# Patient Record
Sex: Female | Born: 1950 | Race: White | Hispanic: No | Marital: Married | Smoking: Never smoker
Health system: Southern US, Community
[De-identification: ages and names within clinical notes are randomized; demographics above are authoritative.]

## PROBLEM LIST (undated history)

## (undated) DIAGNOSIS — R112 Nausea with vomiting, unspecified: Secondary | ICD-10-CM

## (undated) DIAGNOSIS — Z9889 Other specified postprocedural states: Secondary | ICD-10-CM

## (undated) DIAGNOSIS — F329 Major depressive disorder, single episode, unspecified: Secondary | ICD-10-CM

## (undated) DIAGNOSIS — R569 Unspecified convulsions: Secondary | ICD-10-CM

## (undated) DIAGNOSIS — F1020 Alcohol dependence, uncomplicated: Secondary | ICD-10-CM

## (undated) DIAGNOSIS — F32A Depression, unspecified: Secondary | ICD-10-CM

## (undated) DIAGNOSIS — I10 Essential (primary) hypertension: Secondary | ICD-10-CM

## (undated) DIAGNOSIS — T8859XA Other complications of anesthesia, initial encounter: Secondary | ICD-10-CM

## (undated) DIAGNOSIS — G809 Cerebral palsy, unspecified: Secondary | ICD-10-CM

## (undated) DIAGNOSIS — T4145XA Adverse effect of unspecified anesthetic, initial encounter: Secondary | ICD-10-CM

## (undated) HISTORY — PX: BREAST ENHANCEMENT SURGERY: SHX7

## (undated) HISTORY — PX: BLADDER SURGERY: SHX569

## (undated) HISTORY — PX: TUBAL LIGATION: SHX77

## (undated) HISTORY — PX: BREAST SURGERY: SHX581

## (undated) HISTORY — PX: BACK SURGERY: SHX140

---

## 1998-01-09 ENCOUNTER — Inpatient Hospital Stay (HOSPITAL_COMMUNITY): Admission: EM | Admit: 1998-01-09 | Discharge: 1998-01-14 | Payer: Self-pay | Admitting: Emergency Medicine

## 1998-01-15 ENCOUNTER — Encounter (HOSPITAL_COMMUNITY): Admission: RE | Admit: 1998-01-15 | Discharge: 1998-04-15 | Payer: Self-pay | Admitting: Psychiatry

## 2001-05-28 ENCOUNTER — Other Ambulatory Visit: Admission: RE | Admit: 2001-05-28 | Discharge: 2001-05-28 | Payer: Self-pay | Admitting: Obstetrics and Gynecology

## 2001-06-01 ENCOUNTER — Encounter: Payer: Self-pay | Admitting: Obstetrics and Gynecology

## 2001-06-01 ENCOUNTER — Encounter: Admission: RE | Admit: 2001-06-01 | Discharge: 2001-06-01 | Payer: Self-pay | Admitting: Obstetrics and Gynecology

## 2001-06-12 ENCOUNTER — Encounter: Payer: Self-pay | Admitting: Obstetrics and Gynecology

## 2001-06-12 ENCOUNTER — Encounter: Admission: RE | Admit: 2001-06-12 | Discharge: 2001-06-12 | Payer: Self-pay | Admitting: Obstetrics and Gynecology

## 2001-07-25 ENCOUNTER — Encounter: Payer: Self-pay | Admitting: Obstetrics and Gynecology

## 2001-07-25 ENCOUNTER — Encounter: Admission: RE | Admit: 2001-07-25 | Discharge: 2001-07-25 | Payer: Self-pay | Admitting: Obstetrics and Gynecology

## 2002-03-01 ENCOUNTER — Observation Stay (HOSPITAL_COMMUNITY): Admission: RE | Admit: 2002-03-01 | Discharge: 2002-03-02 | Payer: Self-pay | Admitting: Orthopaedic Surgery

## 2002-06-20 ENCOUNTER — Ambulatory Visit (HOSPITAL_COMMUNITY): Admission: RE | Admit: 2002-06-20 | Discharge: 2002-06-21 | Payer: Self-pay | Admitting: Neurological Surgery

## 2002-06-20 ENCOUNTER — Encounter: Payer: Self-pay | Admitting: Neurological Surgery

## 2002-07-09 ENCOUNTER — Encounter: Payer: Self-pay | Admitting: Neurological Surgery

## 2002-07-09 ENCOUNTER — Ambulatory Visit (HOSPITAL_COMMUNITY): Admission: RE | Admit: 2002-07-09 | Discharge: 2002-07-09 | Payer: Self-pay | Admitting: Neurological Surgery

## 2003-03-31 ENCOUNTER — Other Ambulatory Visit: Admission: RE | Admit: 2003-03-31 | Discharge: 2003-03-31 | Payer: Self-pay | Admitting: Internal Medicine

## 2003-04-25 ENCOUNTER — Inpatient Hospital Stay (HOSPITAL_COMMUNITY): Admission: RE | Admit: 2003-04-25 | Discharge: 2003-04-26 | Payer: Self-pay | Admitting: Orthopaedic Surgery

## 2007-03-28 ENCOUNTER — Other Ambulatory Visit: Admission: RE | Admit: 2007-03-28 | Discharge: 2007-03-28 | Payer: Self-pay | Admitting: Family Medicine

## 2008-09-07 ENCOUNTER — Emergency Department (HOSPITAL_COMMUNITY): Admission: EM | Admit: 2008-09-07 | Discharge: 2008-09-07 | Payer: Self-pay | Admitting: Emergency Medicine

## 2008-09-08 ENCOUNTER — Ambulatory Visit: Payer: Self-pay | Admitting: *Deleted

## 2008-09-08 ENCOUNTER — Emergency Department (HOSPITAL_COMMUNITY): Admission: EM | Admit: 2008-09-08 | Discharge: 2008-09-08 | Payer: Self-pay | Admitting: Emergency Medicine

## 2008-09-08 ENCOUNTER — Inpatient Hospital Stay (HOSPITAL_COMMUNITY): Admission: RE | Admit: 2008-09-08 | Discharge: 2008-09-11 | Payer: Self-pay | Admitting: *Deleted

## 2010-01-31 ENCOUNTER — Inpatient Hospital Stay (HOSPITAL_COMMUNITY): Admission: EM | Admit: 2010-01-31 | Discharge: 2010-02-02 | Payer: Self-pay | Admitting: Emergency Medicine

## 2010-07-25 ENCOUNTER — Encounter: Payer: Self-pay | Admitting: Family Medicine

## 2010-09-17 LAB — BASIC METABOLIC PANEL
BUN: 15 mg/dL (ref 6–23)
BUN: 20 mg/dL (ref 6–23)
CO2: 26 mEq/L (ref 19–32)
CO2: 26 mEq/L (ref 19–32)
Calcium: 9.3 mg/dL (ref 8.4–10.5)
Calcium: 9.4 mg/dL (ref 8.4–10.5)
Chloride: 93 mEq/L — ABNORMAL LOW (ref 96–112)
Creatinine, Ser: 0.72 mg/dL (ref 0.4–1.2)
Creatinine, Ser: 1.05 mg/dL (ref 0.4–1.2)
GFR calc Af Amer: >60 mL/min (ref >60)
GFR calc non Af Amer: 53 mL/min — ABNORMAL LOW (ref >60)
Glucose, Bld: 103 mg/dL — ABNORMAL HIGH (ref 70–99)
Glucose, Bld: 97 mg/dL (ref 70–99)
Potassium: 3.2 mEq/L — ABNORMAL LOW (ref 3.5–5.1)
Sodium: 131 mEq/L — ABNORMAL LOW (ref 135–145)
Sodium: 134 mEq/L — ABNORMAL LOW (ref 135–145)

## 2010-09-17 LAB — CBC
HCT: 39.9 % (ref 36.0–46.0)
MCH: 32.6 pg (ref 26.0–34.0)
MCHC: 34.7 g/dL (ref 30.0–36.0)
MCV: 93.9 fL (ref 78.0–100.0)
RDW: 13.9 % (ref 11.5–15.5)

## 2010-09-18 LAB — CBC
HCT: 40.1 % (ref 36.0–46.0)
Hemoglobin: 14 g/dL (ref 12.0–15.0)
MCH: 32.6 pg (ref 26.0–34.0)
MCHC: 35 g/dL (ref 30.0–36.0)

## 2010-09-18 LAB — COMPREHENSIVE METABOLIC PANEL
CO2: 17 mEq/L — ABNORMAL LOW (ref 19–32)
Calcium: 8.9 mg/dL (ref 8.4–10.5)
Creatinine, Ser: 1.11 mg/dL (ref 0.4–1.2)
GFR calc Af Amer: >60 mL/min (ref >60)
GFR calc non Af Amer: 50 mL/min — ABNORMAL LOW (ref >60)
Glucose, Bld: 76 mg/dL (ref 70–99)

## 2010-09-18 LAB — RAPID URINE DRUG SCREEN, HOSP PERFORMED
Cocaine: NOT DETECTED
Opiates: NOT DETECTED
Tetrahydrocannabinol: NOT DETECTED

## 2010-09-18 LAB — DIFFERENTIAL
Lymphocytes Relative: 7 % — ABNORMAL LOW (ref 12–46)
Lymphs Abs: 0.5 10*3/uL — ABNORMAL LOW (ref 0.7–4.0)
Neutrophils Relative %: 79 % — ABNORMAL HIGH (ref 43–77)

## 2010-09-18 LAB — URINALYSIS, ROUTINE W REFLEX MICROSCOPIC
Ketones, ur: >80 mg/dL — AB
Specific Gravity, Urine: 1.02 (ref 1.005–1.030)
Urobilinogen, UA: 1 mg/dL (ref 0.0–1.0)

## 2010-09-18 LAB — PHOSPHORUS: Phosphorus: 2.5 mg/dL (ref 2.3–4.6)

## 2010-09-18 LAB — BASIC METABOLIC PANEL
BUN: 18 mg/dL (ref 6–23)
Chloride: 89 mEq/L — ABNORMAL LOW (ref 96–112)
Creatinine, Ser: 1.05 mg/dL (ref 0.4–1.2)
GFR calc non Af Amer: 53 mL/min — ABNORMAL LOW (ref >60)

## 2010-09-18 LAB — AMMONIA: Ammonia: 47 umol/L — ABNORMAL HIGH (ref 11–35)

## 2010-09-18 LAB — BRAIN NATRIURETIC PEPTIDE: Pro B Natriuretic peptide (BNP): 56 pg/mL (ref 0.0–100.0)

## 2010-09-18 LAB — URINE MICROSCOPIC-ADD ON

## 2010-09-18 LAB — MAGNESIUM: Magnesium: 2.6 mg/dL — ABNORMAL HIGH (ref 1.5–2.5)

## 2010-10-14 LAB — RAPID URINE DRUG SCREEN, HOSP PERFORMED
Amphetamines: NOT DETECTED
Benzodiazepines: NOT DETECTED
Cocaine: NOT DETECTED
Opiates: NOT DETECTED
Tetrahydrocannabinol: NOT DETECTED

## 2010-10-14 LAB — DIFFERENTIAL
Basophils Relative: 0 % (ref 0–1)
Basophils Relative: 0 % (ref 0–1)
Eosinophils Absolute: 0 10*3/uL (ref 0.0–0.7)
Lymphocytes Relative: 18 % (ref 12–46)
Lymphs Abs: 1 10*3/uL (ref 0.7–4.0)
Lymphs Abs: 1.9 10*3/uL (ref 0.7–4.0)
Monocytes Absolute: 0.3 10*3/uL (ref 0.1–1.0)
Monocytes Absolute: 0.5 10*3/uL (ref 0.1–1.0)
Monocytes Relative: 3 % (ref 3–12)
Monocytes Relative: 5 % (ref 3–12)
Neutro Abs: 8.5 10*3/uL — ABNORMAL HIGH (ref 1.7–7.7)
Neutro Abs: 8.6 10*3/uL — ABNORMAL HIGH (ref 1.7–7.7)

## 2010-10-14 LAB — COMPREHENSIVE METABOLIC PANEL
ALT: 30 U/L (ref 0–35)
Albumin: 4 g/dL (ref 3.5–5.2)
Albumin: 4.5 g/dL (ref 3.5–5.2)
Alkaline Phosphatase: 137 U/L — ABNORMAL HIGH (ref 39–117)
Alkaline Phosphatase: 137 U/L — ABNORMAL HIGH (ref 39–117)
BUN: 13 mg/dL (ref 6–23)
Calcium: 8.5 mg/dL (ref 8.4–10.5)
Calcium: 8.6 mg/dL (ref 8.4–10.5)
GFR calc Af Amer: >60 mL/min (ref >60)
Potassium: 3.8 mEq/L (ref 3.5–5.1)
Potassium: 4 mEq/L (ref 3.5–5.1)
Sodium: 138 mEq/L (ref 135–145)
Total Protein: 7.3 g/dL (ref 6.0–8.3)
Total Protein: 7.8 g/dL (ref 6.0–8.3)

## 2010-10-14 LAB — CBC
HCT: 45.5 % (ref 36.0–46.0)
MCHC: 34 g/dL (ref 30.0–36.0)
MCHC: 34.3 g/dL (ref 30.0–36.0)
Platelets: 227 10*3/uL (ref 150–400)
Platelets: 271 10*3/uL (ref 150–400)
RDW: 13.2 % (ref 11.5–15.5)
RDW: 13.4 % (ref 11.5–15.5)

## 2010-10-14 LAB — ETHANOL
Alcohol, Ethyl (B): 227 mg/dL — ABNORMAL HIGH (ref 0–10)
Alcohol, Ethyl (B): 292 mg/dL — ABNORMAL HIGH (ref 0–10)

## 2010-11-16 NOTE — H&P (Signed)
NAMEJAVONA, Ashley Bradley NO.:  [id number]   MEDICAL RECORD NO.:  [id number]          PATIENT TYPE:  IPS   LOCATION:  0302                          FACILITY[facility]  [facility]   PHYSICIAN:  Jasmine Pang, M.D. DATE OF BIRTH:  11/22/1950   DATE OF ADMISSION:  09/08/2008  DATE OF DISCHARGE:                       PSYCHIATRIC ADMISSION ASSESSMENT   IDENTIFICATION:  A 60 year old female.  This is a voluntary admission.   HISTORY OF PRESENT ILLNESS:  First [facility] admission for this 60 year old  who presents requesting alcohol detox.  Says that she had 9 years of  abstinence from alcohol until she relapsed about one week ago.  Her  drinking rapidly escalated to the point that for the past couple days  she has been drinking about a pint of vodka daily.  She says that she  believes that she relapsed due to increased recent stressors related to  parenting issues.  She has a total of nine children between herself and  her husband of 7 years.  Her youngest daughter left home in July 2009  which was quite a loss for her.  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  She denies any other substance  abuse.  Would like to re-achieve abstinence and plans on following up  with Alcoholics Anonymous.  She denies any suicidal thoughts.   PAST PSYCHIATRIC HISTORY:  She has had counseling in the past.  None  recently.  She also has a history of chronic depression and feels that  that has gotten a bit worse lately.  Has been on Prozac 20 mg now, at  least for the past 2 years.  Has a history of previous trial of Effexor  which made her anxious, jittery and sick.  She has a history of one  DWI 17 years ago.  She denies any history of suicidal thoughts or  suicide attempts.   SOCIAL HISTORY:  Registered nurse case Production designer, theatre/television/film.  Currently working for  [employer].  Married for the  past 7 years, husband is  17 years older than she is, and she reports generally that the marriage  is good, although they do have some age differences.  Recent parenting  problems including a last child leaving home in July 2009.  Recently her  stepson has also been having some problems and she has been attempting  to help him.   FAMILY HISTORY:  Mother with chronic depression.   ALCOHOL AND DRUG HISTORY:  History of alcohol abuse, starting to drink  alcohol around age 39, 9 years is longest period abstinent.  She has had  a previous stay at fellowship [facility] in [place] and had one prior detox  at [facility] in 1999.   PRIMARY CARE PHYSICIAN:  Theatre stage manager.   MEDICAL HISTORY:  No current problems.   PAST MEDICAL HISTORY:  Significant for history of seizures with  withdrawal in the past.   CURRENT MEDICATIONS:  Prozac 20 mg daily.   DRUG ALLERGIES:  IVP DYE, IODINE, CONTRAST MEDIUM AND MACRODANTIN.   PHYSICAL EXAMINATION:  GENERAL:  Physical exam was done in the emergency  room as noted in the record.  This is a healthy, but flushed and  disheveled appearing Caucasian female in no distress.  VITAL SIGNS:  Temperature 98.8, pulse 127, respirations 20, blood  pressure 134/82 on presentation.   DIAGNOSTIC STUDIES:  Revealed an alcohol level of 227.  CBC:  WBC 10.2,  hemoglobin 15.1, hematocrit 44.5, platelets 227,000, MCV 91.2.  Chemistry:  Sodium 138, potassium 4.0, chloride 99, carbon dioxide 23,  BUN 11, creatinine 0.71.  Liver enzymes SGOT 37, SGPT 30, alkaline  phosphatase 137, total bilirubin 0.7.   MENTAL STATUS EXAM:  Fully alert female in full contact with reality.  Does have moist skin, flushed complexion, disheveled, appears to be in  full detox.  Somewhat unsteady on her feet, but no frank tremor.  No  motor ataxia.  Speech is normal.  Gives a coherent history.  No memory  lapses.  No thought blocking.  Mood is neutral.  Thought process logical  and  coherent.  No suicidal thoughts.  No dangerous ideas.  Immediate  recent and remote memory are intact.  Insight is good.  Impulse control  and judgment within normal limits.   DIAGNOSES:  AXIS I:  Alcohol abuse and dependence.  Depressive disorder  NOS.  AXIS II:  No diagnosis.  AXIS III:  No diagnosis.  AXIS IV:  Moderate to severe parenting issues.  AXIS V:  Current 44, past year not known.  Estimated at 69+.   PLAN:  Plan is to voluntarily admit her for a safe detox in 5 days and  we started her on a Librium protocol.  She will receive Librium in  tapering fashion along with supportive medications and vitamin  supplements.  She is ordered Ambien 10 mg h.s. p.r.n. insomnia and we  are going to increase her Prozac to 30 mg daily.  She feels that she has  been on it for so long that it is no longer giving her the support for  her depression that she needs.      Margaret A. Lorin Picket, N.P.      Jasmine Pang, M.D.  Electronically Signed    MAS/MEDQ  D:  09/09/2008  T:  09/10/2008  Job:  [id number]

## 2010-11-16 NOTE — Discharge Summary (Signed)
Ashley Bradley, Ashley Bradley NO.:  [id number]   MEDICAL RECORD NO.:  [id number]          PATIENT TYPE:  IPS   LOCATION:  0302                          FACILITY:  [facility]   PHYSICIAN:  Jasmine Pang, M.D. DATE OF BIRTH:  10-Mar-1951   DATE OF ADMISSION:  09/08/2008  DATE OF DISCHARGE:  09/11/2008                               DISCHARGE SUMMARY   IDENTIFICATION:  This is a 60 year old married white female who was  admitted on a voluntary basis on September 08, 2008.   HISTORY OF PRESENT ILLNESS:  This is the first [facility] admission for this 14-  year-old who presents requesting alcohol detox.  She states she had 9  years of abstinence from alcohol until she relapsed about 1 week ago.  Her drinking rapidly escalated to the point that for the past 10 couple  of days she has been drinking about a pint of Vodka daily.  She states  she believes that she relapsed due to the increased recent stressors  related to parenting issues.  She has a total of 9 children between  herself and her husband of 7 years.  Her youngest daughter left home in  July 2009, which was a loss for her.  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  She denies any other  substance abuse.  She would like to achieve abstinence and plans on  following up with alcoholic anonymous.  She denies any suicidal  thoughts.   PAST PSYCHIATRIC HISTORY:  The patient has had counseling in the past.  None recently.  She also had a history of chronic depression, which she  feels has gotten a bit worse lately.  She has been on Prozac 20 mg now  at least for 2 years.  She had a history of previous trial of Effexor,  which made her anxious, jittery, and sick.  She has a history of 1 DWI  17 years ago.  She denies any history of suicidal thoughts or suicide  attempt.   FAMILY HISTORY:  Mother has chronic depression.   ALCOHOL AND  DRUG HISTORY:  History of alcohol abuse started to drink  alcohol around the age of 33, 9 years is the longest period abstinent.  She has had a previous stay at [facility] in [place] and 1 prior  detox at [facility] in 1999.   MEDICAL HISTORY:  There is no current medical problems.   PAST MEDICAL HISTORY:  Significant history of seizures with withdrawal  in the past.   CURRENT MEDICATIONS:  Prozac 20 mg daily.   DRUG ALLERGIES:  IVP DYE, IODINE, CONTRAST MEDIUM, and MACRODANTIN.   PHYSICAL FINDINGS:  There were no acute physical or medical problems  noted.   DIAGNOSTIC STUDIES:  Revealed an alcohol level of 227.  CBC revealed WBC  of 10.2, hemoglobin of 15.1, hematocrit of 44.5, platelets 227,000, MCV  is 91.2.  Chemistries revealed sodium of 138, potassium of 4, chloride  99, carbon dioxide  23.  BUN 11, creatinine 0.71.  Liver enzymes revealed  SGOT of 37, SGPT of 30, alkaline phosphatase of 137, total bilirubin was  0.7.   HOSPITAL COURSE:  Upon admission, the patient was started on Librium  detox protocol.  She was also started on Ambien 10 mg p.o. q.h.s. p.r.n.  insomnia, Prozac was increased to 30 mg daily.  In the past, she has had  an increase up to 40 mg daily and felt too agitated on this, so she was  agreeable to going up to 30 mg and hopes that we could get some benefit  without side effects.  Possibly, after this she may be able to a higher  dose.  Due to palpitations, the patient was given atenolol 25 mg 1 time  now.  She was also started on Vistaril 25 mg q.4 h. p.r.n. anxiety and  Vistaril 25 mg q.h.s.  In individual sessions, the patient was reserved,  but cooperative.  She was very tearful and depressed.  She had a sad  affect.  She stated she had been trying to detox herself, but could not.  She has been clean for 9 years and thought I could drink a little bit.  She realizes now that she cannot drink at all.  Her step son has been  causing  family stress.  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  On September 10, 2008, the patient's mood was still depressed and anxious.  She was tearful.  She stated she did not like being in the hospital.  She was not able to sleep and her back pain was worsening.  She wanted  to go home as soon as possible.  She was not suicidal and it was  determined that we would try to get her out tomorrow.  On September 10, 2008, there was a family session with the patient's husband and daughter  to discuss ongoing concerns and discharge plans.  Husband states all  alcohol has been removed from the home and husband will be monitoring  the patient's medication upon discharge.  Daughter states that she is  within 10 minutes arrival of mother for support and plan on intending  support groups with the patient to gain keep her understanding and  support.  Husband stated he would be accompanying wife as well.  The  patient was tearful expressing concern about possibly losing her job  over relapse due to the professional responsibility of her employment.  She was questioning whether Yvonne Kendall was still practicing and wanted  to see her for followup.  She also asked for Dr. Dub Mikes for followup, but  he is not taking any new patient.  Family was receptive in grateful.  The patient was showing clear insight to relapse and depressive  triggers.  On September 11, 2008, mental status had improved from admission  status.  The patient was less depressed, less anxious.  Affect was  consistent with mood, though she still somewhat tearful.  There was no  suicidal or homicidal ideation.  No thoughts of self-injurious behavior.  No auditory or visual hallucinations.  No paranoia or delusions.  Thoughts were logical and goal-directed.  Thought content, no  predominant theme.  Cognitive was grossly intact.  Insight good.  Judgment good.  Impulse control good.   It was felt the patient was safe  for discharge today and she was going to be returning  home to live with  her husband.   DISCHARGE DIAGNOSES:  Axis I: Alcohol dependence formerly in remission  for 9 years, depressive disorder, not otherwise specified.  Axis II: None.  Axis III: None.  Axis IV: Moderate to severe (parenting issues, work stress, burden of  psychiatric problems).  Axis V: Global assessment of functioning was 50 upon discharge.  GAF was  44 highest past year and it was estimated GAF highest past year was 75-  80.   DISCHARGE PLAN:  There was no specific activity level or dietary  restriction.   POSTHOSPITAL CARE PLANS:  The patient will see Saul Fordyce at  [facility] for counselor and psychiatrist on September 15, 2008 at 9:20 a.m..   DISCHARGE MEDICATIONS:  1. Prozac 30 mg at bedtime.  2. Librium 25 mg 3 times a day on September 11, 2008, September 12, 2008, and      September 13, 2008; and 2 times a day on September 14, 2008, September 15, 2008, and September 16, 2008; and once daily on September 17, 2008, September 18, 2008, and September 19, 2008; and then discontinue.      Jasmine Pang, M.D.  Electronically Signed     BHS/MEDQ  D:  09/11/2008  T:  09/12/2008  Job:  [id number]

## 2010-11-19 NOTE — Op Note (Signed)
NAME:  Ashley Bradley, Ashley Bradley                         ACCOUNT NO.:  [id number]   MEDICAL RECORD NO.:  [id number]                   PATIENT TYPE:  OIB   LOCATION:  3021                                 FACILITY:  [facility]   PHYSICIAN:  Stefani Dama, M.D.               DATE OF BIRTH:  09-20-1950   DATE OF PROCEDURE:  06/20/2002  DATE OF DISCHARGE:  06/21/2002                                 OPERATIVE REPORT   PREOPERATIVE DIAGNOSIS:  Cervical spondylosis plus herniated nucleus  pulposus with left cervical radiculopathy, C5-6 and C6-7.   POSTOPERATIVE DIAGNOSIS:  Cervical spondylosis plus herniated nucleus  pulposus with left cervical radiculopathy, C5-6 and C6-7.   PROCEDURE:  Anterior cervical diskectomy and decompression of C5 and C6,  structural allograft, Synthes plate fixation.   SURGEON:  Stefani Dama, M.D.   ASSISTANT:  Clydene Fake, M.D.   ANESTHESIA:  General endotracheal.   INDICATIONS:  The patient is a 60 year old individual who has had a  significant left upper extremity radiculopathy for a period of several  months.  She has failed efforts at conservative management, and she has  spondylitic changes at C5-6 and at C6-7 with a herniated nucleus pulposus in  the foramen at C6-7 on the left.  She was advised regarding surgical  decompression of this process.  Because her symptoms have been unrelenting  and, indeed, she feels that they are worsening, I have advised that a  surgical intervention via an anterior diskectomy be performed.  She is now  admitted for this procedure.   DESCRIPTION OF PROCEDURE:  The patient was brought to the operating room and  placed on the table in supine position.  After smooth induction of general  endotracheal anesthesia, she was placed in five pounds of Holter traction.  The neck was prepped with Duraprep and draped in a sterile fashion.  A  transverse incision was made in the left side of the neck, and this was  carried down through  the platysma.  The plane between the  sternocleidomastoid and the strap muscles was dissected bluntly until the  prevertebral space was reached.  The first identifiable disk space was noted  to be that of C5-6 by localizing radiograph.  Then by dissecting the  esophagus medially, the longus colli muscle belly was entered and by  dissecting this away from the ventral anterior vertebral space, a self-  retaining retractor was placed in C5-6.  The disk space was opened ventrally  with a 15 blade and then using a combination of curettes and rongeurs, some  bony osteophytic material from the inferior margin of the body of C5 was  removed.  This opened up the disk space, and the disk was removed in a  piecemeal fashion using a combination of Kerrison rongeurs.  As the disk  space was evacuated toward the posterior longitudinal ligament, care was  taken to remove bits and  pieces of tissue which were noted to be attached to  the posterior longitudinal ligament that seemed to protrude beyond the end  of the vertebral bodies.  The ligament was then opened, and it was noted to  be significantly thickened and attached to an osteophyte from the inferior  margin of the body of C6.  Dissection was then carried out laterally to the  right side and to the left side. Uncinate process spurs from each of these  levels were then taken down with the high-speed air drill and 2.3 mm  dissecting tool.  In the end, the lateral recesses on each side were well-  decompressed.  The central canal was similarly well-decompressed.  A 7 mm  tricortical bone graft was shaved into its final position to fit in the  intervertebral space, and then it was tamped in until flush.  Attention was  then turned to C6-7, where a similar procedure was carried out at that disk  space.  The posterior longitudinal ligament on the left side was noted to  have a significant tear with some subligamentous material herniated behind  the  vertebral body.  This was gently teased out, and then the 2 and 3 mm  Kerrison punch were placed under the end of the ligament to remove it and  decompress the dural space in the interim.  The nerve roots off to either  side were well-decompressed.  Once this was accomplished, the second 7 mm  tricortical graft was shaved into its final configuration and placed in the  invertebral space and counter sunk slightly.  Traction was then removed.  The ventral aspects of the vertebral bodies were then shaved appropriately  and a 37 mm standard-size Synthes plate was contoured to the prevertebral  spaces and fastened with six locking 4 x 14 mm screws.  The area was  copiously irrigated with antibiotic irrigating solution.  Hemostasis of the  soft tissues was carefully obtained, and a localizing radiograph identified  good position of the surgical construct.  Then the platysma was closed with  3-0 Vicryl in interrupted fashion, and 3-0 Vicryl was used to close the  subcuticular skin.  The patient tolerated the procedure well.                                               Stefani Dama, M.D.    Merla Riches  D:  06/20/2002  T:  06/21/2002  Job:  [id number]

## 2010-11-19 NOTE — Op Note (Signed)
NAME:  Ashley Bradley, Ashley Bradley                         ACCOUNT NO.:  [id number]   MEDICAL RECORD NO.:  [id number]                   PATIENT TYPE:  INP   LOCATION:  5005                                 FACILITY:  [facility]   PHYSICIAN:  Mark C. Ophelia Charter, M.D.                 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   PROCEDURE:  L3-L4 decompression, bilateral foraminotomies, and cyst removal.   SURGEON:  Mark C. Ophelia Charter, M.D.   ASSISTANT:  Garnette Scheuermann, P.A.-C.   ANESTHESIA:  General.   ESTIMATED BLOOD LOSS:  300 cc.   PROCEDURE:  After induction of general anesthesia, the patient was placed on  the Andrews frame in the kneeling position with careful padding and  positioning, and preoperative Ancef prophylaxis.  The area was scrubbed with  Betadine and Vi-Drape applied.  Laminectomy sheath and needle localization  with the spinal needle, cross table lateral x-ray confirmed the needle was  exactly at the 3-4 level.  The patient has grade 1 anterolisthesis at this  level and has intact pars.  She had a cyst which was present centrally  intermixed with the ligamentum flavum causing central compression and she  was having primarily right leg pain and weakness not responsive to  conservative treatment.  The inferior aspect of the spinous process was  removed at 3 and the top portion of 4 removing about 1/4 of the spinous  process of L4.  Laminotomy was performed on both the right and left side.  The lamina was hypertrophic and there was good ligamentum flavum which was  adherent to the dura and required operative microscope for microdissection  with the nerve hook, dural separator, and dural forceps to gently dissect  the adherent ligament off of  the dura.  There was apparent crystals  consistent with old history of epidural steroid injections.  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  The cyst did not appear to extend  to the facet joint.  A 1/2 inch curved osteotome was used to remove some  bone out the palpable pedicles using the dural separator.  The bone was  removed from the right and left side until bone was removed all the way out  to the level of the pedicle and the dura was seen falling freely down to the  floor with no areas of compression.  The foramina was enlarged and thick  chunks of ligament were removed above and below relieving the compression.  The dural separator was used for palpation anteriorly to the dura with no  areas of compression.  The disc was inspected on both the right and left  side,  there was no disc herniation.  There was some hypertrophic ridging due  to the mild instability that the patient had.  After irrigation and final  palpation above and below some final removal of bone on the right and left  gutter to make sure she had a smooth wall, the roots were inspected, dura  was intact.  Repeat irrigation and then closure with 0  Vicryl in the fascia, 2-0 and 3-0 Vicryl in the subcutaneous tissue.  Placement of skin staples, postop dressing with Adaptic, 4 by 4s, and tape.  The patient tolerated the procedure well and was transported to the recovery  room in stable condition.                                               Mark C. Ophelia Charter, M.D.    MCY/MEDQ  D:  03/01/2002  T:  03/04/2002  Job:  [id number]

## 2010-11-19 NOTE — Op Note (Signed)
NAME:  Ashley Bradley, STAMP                         ACCOUNT NO.:  [id number]   MEDICAL RECORD NO.:  [id number]                   PATIENT TYPE:  INP   LOCATION:  2892                                 FACILITY:  [facility]   PHYSICIAN:  Mark C. Ophelia Charter, M.D.                 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   PROCEDURE:  Posterior C5 to C7 cervical fusion with interspinous wiring and  right posterior iliac crest bone graft and anterior cervical scar revision.   SURGEON:  Mark C. Ophelia Charter, M.D.   ESTIMATED BLOOD LOSS:  300 mL.   DRAINS:  One Hemovac neck and one Hemovac iliac crest.   BRIEF HISTORY:  This 60 year old female had an anterior cervical diskectomy  and fusion with Allograft and Synthes plating last year.  She has had  persistent neck and shoulder pain and CT scan showed pseudoarthrosis at the  C6-7 level.  There was motion on flexion and extension views and it appeared  on the scan that the C5-6 level was solid.  The patient also had some  problems with scar anteriorly and requested that it be revised.   PROCEDURE:  The patient was placed prone on a horseshoe head holder after  intubation with the arms placed at the side and prepping of the right  posterior iliac crest and neck.  10-10 drapes had been placed and half  sheets Betadine Vi-Drape times two, sterile Mayo stand at the head and  thyroid sheet was applied. An incision was made at the midline from C4 to  C7.  Subperiosteal dissection down on the lamina was performed using Gelpi  and cerebellar retractor.  A total of three x-rays were taken.  Initially  the Kocher was placed between the spinous process of C5 and C6.  The  incision was extended slightly distal so that C7 was exposed and a second x-  ray was taken with  a Kocher between C6 and C7.  Testing with Kocher clamp,  there was obvious motion at the bottom level of C6-7 and there was motion  present at C5-6 as well which was visualized both at the spinous process  level and also the lamina level.  After the lamina was completely cleaned of  soft tissue a wire was fashioned using three #24 gauge wires placing them in  a power drill and twisting them.  The wire was placed using a right angle  clamp underneath the C7 spinous process at the base without interrupting the  interspinous ligament. A small notch was made in the top of C5.  The wire  was tightened down, twisted, cut, tested and was secure.  Testing with the  Zannie Cove showed that all three  vertebra moved together and there was no motion  between them.  A power bur had been used to freshen up the lamina for a good  bleeding bone surface on the right and left side at C5-6 and C6-7.  The  incision was made over the right posterior iliac crest and strips of  corticocancellous bone were harvested.  A gouge was used to harvest  cancellous bone and the inner table was preserved.  Corticocancellous bone  was placed directly down between the lamina.  A small tiny piece was placed  between the lamina with care taken not to push down so that the bone could  cause insulting of the ligament.  Abundant chunks of cancellous bone were  packed on top and then compressed down.  The self retaining retractor was  removed.  A Hemovac was placed through a separate stab incision and the  fascia was closed with 0 Vicryl, 2-0 Vicryl to subcutaneous tissue and 3-0  Prolene subcuticular skin closure.  The iliac crest was closed with 0 Vicryl  and 2-0 Vicryl and also a Prolene subcuticular skin closure.  Tincture of  Benzoin, Steri-Strips and Marcaine infiltrate and tape.  The patient was  flipped over on another operating room table.  The neck  was prepped and the area was squared with tiles and the old incision was   excised and then reclosed with a 4-0 Vicryl subcuticular closure.  Tincture  of Benzoin, Steri-Strips and a soft collar were applied.  The patient was  transferred to the recovery room in stable condition.                                               Mark C. Ophelia Charter, M.D.    MCY/MEDQ  D:  04/25/2003  T:  04/25/2003  Job:  [id number]

## 2012-03-14 ENCOUNTER — Other Ambulatory Visit (HOSPITAL_COMMUNITY)
Admission: RE | Admit: 2012-03-14 | Discharge: 2012-03-14 | Disposition: A | Discharge status: Home / Self Care | Payer: 59 | Source: Ambulatory Visit | Attending: Family Medicine | Admitting: Family Medicine

## 2012-03-14 ENCOUNTER — Other Ambulatory Visit: Payer: Self-pay | Admitting: Family Medicine

## 2012-03-14 DIAGNOSIS — Z124 Encounter for screening for malignant neoplasm of cervix: Secondary | ICD-10-CM | POA: Insufficient documentation

## 2012-03-14 DIAGNOSIS — Z1151 Encounter for screening for human papillomavirus (HPV): Secondary | ICD-10-CM | POA: Insufficient documentation

## 2013-03-14 ENCOUNTER — Emergency Department (HOSPITAL_COMMUNITY)
Admission: EM | Admit: 2013-03-14 | Discharge: 2013-03-15 | Disposition: A | Discharge status: Other Acute Inpatient Hospital | Payer: 59 | Attending: Emergency Medicine | Admitting: Emergency Medicine

## 2013-03-14 ENCOUNTER — Encounter (HOSPITAL_COMMUNITY): Payer: Self-pay | Admitting: Emergency Medicine

## 2013-03-14 DIAGNOSIS — I10 Essential (primary) hypertension: Secondary | ICD-10-CM | POA: Insufficient documentation

## 2013-03-14 DIAGNOSIS — R5381 Other malaise: Secondary | ICD-10-CM | POA: Insufficient documentation

## 2013-03-14 DIAGNOSIS — F329 Major depressive disorder, single episode, unspecified: Secondary | ICD-10-CM | POA: Insufficient documentation

## 2013-03-14 DIAGNOSIS — F3289 Other specified depressive episodes: Secondary | ICD-10-CM | POA: Insufficient documentation

## 2013-03-14 DIAGNOSIS — R259 Unspecified abnormal involuntary movements: Secondary | ICD-10-CM | POA: Insufficient documentation

## 2013-03-14 DIAGNOSIS — F1010 Alcohol abuse, uncomplicated: Secondary | ICD-10-CM

## 2013-03-14 DIAGNOSIS — F1020 Alcohol dependence, uncomplicated: Secondary | ICD-10-CM | POA: Insufficient documentation

## 2013-03-14 DIAGNOSIS — G4700 Insomnia, unspecified: Secondary | ICD-10-CM | POA: Insufficient documentation

## 2013-03-14 DIAGNOSIS — Z79899 Other long term (current) drug therapy: Secondary | ICD-10-CM | POA: Insufficient documentation

## 2013-03-14 HISTORY — DX: Alcohol dependence, uncomplicated: F10.20

## 2013-03-14 HISTORY — DX: Depression, unspecified: F32.A

## 2013-03-14 HISTORY — DX: Essential (primary) hypertension: I10

## 2013-03-14 HISTORY — DX: Major depressive disorder, single episode, unspecified: F32.9

## 2013-03-14 LAB — CBC
MCH: 31.2 pg (ref 26.0–34.0)
MCHC: 35.1 g/dL (ref 30.0–36.0)
MCV: 88.8 fL (ref 78.0–100.0)
Platelets: 293 10*3/uL (ref 150–400)
RBC: 5.1 MIL/uL (ref 3.87–5.11)
RDW: 13.1 % (ref 11.5–15.5)

## 2013-03-14 LAB — COMPREHENSIVE METABOLIC PANEL
ALT: 21 U/L (ref 0–35)
AST: 25 U/L (ref 0–37)
Albumin: 4 g/dL (ref 3.5–5.2)
CO2: 22 mEq/L (ref 19–32)
Calcium: 9 mg/dL (ref 8.4–10.5)
Creatinine, Ser: 0.79 mg/dL (ref 0.50–1.10)
GFR calc non Af Amer: 87 mL/min — ABNORMAL LOW (ref >90)
Sodium: 134 mEq/L — ABNORMAL LOW (ref 135–145)
Total Protein: 7.4 g/dL (ref 6.0–8.3)

## 2013-03-14 LAB — RAPID URINE DRUG SCREEN, HOSP PERFORMED
Amphetamines: NOT DETECTED
Benzodiazepines: NOT DETECTED
Cocaine: NOT DETECTED
Opiates: NOT DETECTED
Tetrahydrocannabinol: NOT DETECTED

## 2013-03-14 LAB — SALICYLATE LEVEL: Salicylate Lvl: <2 mg/dL — ABNORMAL LOW (ref 2.8–20.0)

## 2013-03-14 MED ORDER — LORAZEPAM 2 MG/ML IJ SOLN: 1.0000 mg | Freq: Four times a day (QID) | INTRAMUSCULAR | Status: DC | PRN

## 2013-03-14 MED ORDER — LORAZEPAM 1 MG PO TABS
0.0000 mg | ORAL_TABLET | Freq: Four times a day (QID) | ORAL | Status: DC
  Administered 2013-03-14 – 2013-03-15 (×3): 1 mg via ORAL
  Filled 2013-03-14 (×3): qty 1

## 2013-03-14 MED ORDER — ADULT MULTIVITAMIN W/MINERALS CH
1.0000 | ORAL_TABLET | Freq: Every day | ORAL | Status: DC
  Administered 2013-03-14 – 2013-03-15 (×2): 1 via ORAL
  Filled 2013-03-14 (×2): qty 1

## 2013-03-14 MED ORDER — FOLIC ACID 1 MG PO TABS
1.0000 mg | ORAL_TABLET | Freq: Every day | ORAL | Status: DC
  Administered 2013-03-14 – 2013-03-15 (×2): 1 mg via ORAL
  Filled 2013-03-14 (×2): qty 1

## 2013-03-14 MED ORDER — FLUOXETINE HCL 40 MG PO CAPS: 40.0000 mg | ORAL_CAPSULE | Freq: Every day | ORAL | Status: DC

## 2013-03-14 MED ORDER — AMPHETAMINE-DEXTROAMPHET ER 10 MG PO CP24
20.0000 mg | ORAL_CAPSULE | Freq: Every morning | ORAL | Status: DC
  Administered 2013-03-15: 20 mg via ORAL
  Filled 2013-03-14: qty 2

## 2013-03-14 MED ORDER — THIAMINE HCL 100 MG/ML IJ SOLN: 100.0000 mg | Freq: Every day | INTRAMUSCULAR | Status: DC

## 2013-03-14 MED ORDER — LORAZEPAM 1 MG PO TABS
0.0000 mg | ORAL_TABLET | Freq: Two times a day (BID) | ORAL | Status: DC
Start: 2013-03-16 — End: 2013-03-15

## 2013-03-14 MED ORDER — VITAMIN B-1 100 MG PO TABS
100.0000 mg | ORAL_TABLET | Freq: Every day | ORAL | Status: DC
  Administered 2013-03-14 – 2013-03-15 (×2): 100 mg via ORAL
  Filled 2013-03-14 (×2): qty 1

## 2013-03-14 MED ORDER — LORAZEPAM 1 MG PO TABS
1.0000 mg | ORAL_TABLET | Freq: Four times a day (QID) | ORAL | Status: DC | PRN
  Administered 2013-03-14 – 2013-03-15 (×2): 1 mg via ORAL
  Filled 2013-03-14 (×2): qty 1

## 2013-03-14 MED ORDER — LOSARTAN POTASSIUM 50 MG PO TABS
50.0000 mg | ORAL_TABLET | Freq: Every day | ORAL | Status: DC
  Administered 2013-03-14 – 2013-03-15 (×2): 50 mg via ORAL
  Filled 2013-03-14 (×2): qty 1

## 2013-03-14 MED ORDER — FLUOXETINE HCL 20 MG PO CAPS
40.0000 mg | ORAL_CAPSULE | Freq: Every day | ORAL | Status: DC
  Administered 2013-03-14 – 2013-03-15 (×2): 40 mg via ORAL
  Filled 2013-03-14 (×3): qty 2
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
## 2013-03-14 NOTE — BH Assessment (Signed)
Assessment begun at 8:43PM completed 9:15 PM. Writer spoke with Tanna Savoy at that time and ran with Denyse Amass PA at 9:33 PM. Patient accepted pending bed availability for detox.  [facility] contacted; no bed availability.  Patient's nurse, Hansel Starling, RN and attending Benjiman Core MD notified at 10:10. Carney Bern, LCSW

## 2013-03-14 NOTE — ED Notes (Signed)
Pt presents for alcohol detox, states she relapsed 4 yrs ago.  Pt reports she is a binge drinker,drinks 1 bottle of vodka.  Denies SI, HI or AV hallucinations.Pt reports she has been diag with Major Depression.  Last drink 2pm today.  Pt cooperative but anxious.

## 2013-03-14 NOTE — ED Notes (Signed)
Per pt, has been binge drinking for about a week-started with wine and progressed to Vodka-sober for 8 years, relapse 4 years ago-last drink after lunch-starting to feel "uncomfortable"

## 2013-03-14 NOTE — ED Provider Notes (Signed)
CSN: [id number]     Arrival date & time 03/14/13  1515 History   First MD Initiated Contact with Patient 03/14/13 1639     Chief Complaint  Patient presents with  . Medical Clearance   (Consider location/radiation/quality/duration/timing/severity/associated sxs/prior Treatment) The history is provided by the patient. No language interpreter was used.  Pt is a 62 year old female who presents today seeking detox. She had a sober period for about 8 years and has relapsed for the last 4 years. She has been binge drinking this week, starting with wine and progressed to straight vodka. Her last drink was 2pm today. She reports that she has had withdrawal seizures in the past. She also reports that she has not taken her B/P med for the last week. She denies and auditory or visual hallucinations. She denies homicidal or suicidal ideation but does admit to feeling depression. She relays that she has been depressed "all of her life". She has insomnia and a difficult time getting out of bed in the morning. She has a difficult time concentrating and is easily distracted. She reports that she is very unhappy at her job and doesn't get to do the things that make her happy.    Past Medical History  Diagnosis Date  . Hypertension   . Alcoholism   . Depression    Past Surgical History  Procedure Laterality Date  . Breast surgery    . Tubal ligation    . Bladder surgery     No family history on file. History  Substance Use Topics  . Smoking status: Never Smoker   . Smokeless tobacco: Not on file  . Alcohol Use: Yes   OB History   Grav Para Term Preterm Abortions TAB SAB Ect Mult Living                 Review of Systems  Constitutional: Positive for activity change.  HENT:       Cheeks are flushed  Neurological: Positive for tremors and weakness.    Allergies  Macrodantin  Home Medications   Current Outpatient Rx  Name  Route  Sig  Dispense  Refill  . amphetamine-dextroamphetamine  (ADDERALL XR) 20 MG 24 hr capsule   Oral   Take 20-40 mg by mouth every morning.         [name] FLUoxetine (PROZAC) 40 MG capsule   Oral   Take 40 mg by mouth daily.         [name] losartan (COZAAR) 50 MG tablet   Oral   Take 50 mg by mouth daily.          BP 169/98  Pulse 72  Temp(Src) 98.2 F (36.8 C) (Oral)  Resp 18  SpO2 100% Physical Exam  Nursing note and vitals reviewed. Constitutional: She is oriented to person, place, and time. She appears well-developed and well-nourished.  HENT:  Head: Normocephalic and atraumatic.  Eyes: Pupils are equal, round, and reactive to light.  Neck: Normal range of motion. Neck supple. No thyromegaly present.  Cardiovascular: Normal rate, regular rhythm, normal heart sounds and intact distal pulses.   Pulmonary/Chest: Effort normal and breath sounds normal.  Abdominal: Soft. Bowel sounds are normal. There is no hepatosplenomegaly.  Musculoskeletal: Normal range of motion.  Neurological: She is alert and oriented to person, place, and time.  Skin: Skin is warm and dry.  Psychiatric: Her speech is normal and behavior is normal. Thought content normal. She exhibits a depressed mood.    ED  Course  Procedures (including critical care time) Labs Review Labs Reviewed  CBC - Abnormal; Notable for the following:    Hemoglobin 15.9 (*)    All other components within normal limits  URINE RAPID DRUG SCREEN (HOSP PERFORMED)  ACETAMINOPHEN LEVEL  COMPREHENSIVE METABOLIC PANEL  ETHANOL  SALICYLATE LEVEL   Imaging Review No results found.  MDM   1. Alcohol abuse   2. Hypertension   3. Depression        Irish Elders, NP 03/14/13 1755

## 2013-03-14 NOTE — ED Notes (Signed)
Family took belongings home

## 2013-03-14 NOTE — ED Provider Notes (Signed)
Medical screening examination/treatment/procedure(s) were performed by non-physician practitioner and as supervising physician I was immediately available for consultation/collaboration.  Gurjit Loconte R. Dasean Brow, MD 03/14/13 2342 

## 2013-03-15 ENCOUNTER — Inpatient Hospital Stay (HOSPITAL_COMMUNITY)
Admission: AD | Admit: 2013-03-15 | Discharge: 2013-03-19 | DRG: 897 | Disposition: A | Discharge status: Home / Self Care | Payer: No Typology Code available for payment source | Source: Intra-hospital | Attending: Psychiatry | Admitting: Psychiatry

## 2013-03-15 DIAGNOSIS — F1020 Alcohol dependence, uncomplicated: Principal | ICD-10-CM | POA: Diagnosis present

## 2013-03-15 DIAGNOSIS — F411 Generalized anxiety disorder: Secondary | ICD-10-CM | POA: Diagnosis present

## 2013-03-15 DIAGNOSIS — F1010 Alcohol abuse, uncomplicated: Secondary | ICD-10-CM

## 2013-03-15 DIAGNOSIS — F332 Major depressive disorder, recurrent severe without psychotic features: Secondary | ICD-10-CM

## 2013-03-15 DIAGNOSIS — Z23 Encounter for immunization: Secondary | ICD-10-CM

## 2013-03-15 DIAGNOSIS — I10 Essential (primary) hypertension: Secondary | ICD-10-CM | POA: Diagnosis present

## 2013-03-15 DIAGNOSIS — Z87820 Personal history of traumatic brain injury: Secondary | ICD-10-CM

## 2013-03-15 DIAGNOSIS — F322 Major depressive disorder, single episode, severe without psychotic features: Secondary | ICD-10-CM | POA: Diagnosis present

## 2013-03-15 DIAGNOSIS — F329 Major depressive disorder, single episode, unspecified: Secondary | ICD-10-CM | POA: Diagnosis present

## 2013-03-15 DIAGNOSIS — F1021 Alcohol dependence, in remission: Secondary | ICD-10-CM | POA: Diagnosis present

## 2013-03-15 MED ORDER — LOSARTAN POTASSIUM 50 MG PO TABS
50.0000 mg | ORAL_TABLET | Freq: Every day | ORAL | Status: DC
  Administered 2013-03-16 – 2013-03-19 (×4): 50 mg via ORAL
  Filled 2013-03-15 (×3): qty 1
  Filled 2013-03-15: qty 14
  Filled 2013-03-15 (×2): qty 1

## 2013-03-15 MED ORDER — MAGNESIUM HYDROXIDE 400 MG/5ML PO SUSP: 30.0000 mL | Freq: Every day | ORAL | Status: DC | PRN

## 2013-03-15 MED ORDER — LORAZEPAM 1 MG PO TABS: 1.0000 mg | ORAL_TABLET | Freq: Four times a day (QID) | ORAL | Status: DC | PRN

## 2013-03-15 MED ORDER — LORAZEPAM 1 MG PO TABS: 0.0000 mg | ORAL_TABLET | Freq: Two times a day (BID) | ORAL | Status: DC

## 2013-03-15 MED ORDER — THIAMINE HCL 100 MG/ML IJ SOLN: 100.0000 mg | Freq: Every day | INTRAMUSCULAR | Status: DC

## 2013-03-15 MED ORDER — FLUOXETINE HCL 20 MG PO CAPS
40.0000 mg | ORAL_CAPSULE | Freq: Every day | ORAL | Status: DC
  Administered 2013-03-16 – 2013-03-19 (×4): 40 mg via ORAL
  Filled 2013-03-15: qty 1
  Filled 2013-03-15 (×5): qty 2
  Filled 2013-03-15: qty 28

## 2013-03-15 MED ORDER — ACETAMINOPHEN 325 MG PO TABS
650.0000 mg | ORAL_TABLET | Freq: Four times a day (QID) | ORAL | Status: DC | PRN
  Administered 2013-03-16 – 2013-03-18 (×3): 650 mg via ORAL

## 2013-03-15 MED ORDER — ALUM & MAG HYDROXIDE-SIMETH 200-200-20 MG/5ML PO SUSP: 30.0000 mL | ORAL | Status: DC | PRN

## 2013-03-15 MED ORDER — TRAZODONE HCL 50 MG PO TABS: 50.0000 mg | ORAL_TABLET | Freq: Every evening | ORAL | Status: DC | PRN

## 2013-03-15 MED ORDER — LORAZEPAM 1 MG PO TABS: 0.0000 mg | ORAL_TABLET | Freq: Four times a day (QID) | ORAL | Status: DC

## 2013-03-15 MED ORDER — VITAMIN B-1 100 MG PO TABS
100.0000 mg | ORAL_TABLET | Freq: Every day | ORAL | Status: DC
  Administered 2013-03-16: 100 mg via ORAL
  Filled 2013-03-15 (×3): qty 1

## 2013-03-15 MED ORDER — FOLIC ACID 1 MG PO TABS
1.0000 mg | ORAL_TABLET | Freq: Every day | ORAL | Status: DC
  Administered 2013-03-16 – 2013-03-19 (×4): 1 mg via ORAL
  Filled 2013-03-15 (×6): qty 1

## 2013-03-15 MED ORDER — ADULT MULTIVITAMIN W/MINERALS CH
1.0000 | ORAL_TABLET | Freq: Every day | ORAL | Status: DC
  Administered 2013-03-16: 1 via ORAL
  Filled 2013-03-15 (×3): qty 1

## 2013-03-15 MED ORDER — LORAZEPAM 2 MG/ML IJ SOLN: 1.0000 mg | Freq: Four times a day (QID) | INTRAMUSCULAR | Status: DC | PRN

## 2013-03-15 NOTE — ED Notes (Signed)
Pt has signed voluntary paperwork to be admitted in [facility].
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
## 2013-03-15 NOTE — Progress Notes (Signed)
Patient ID: Ashley Bradley, female   DOB: [birth year], 62 y.o.   MRN: [id number] Patient is a 62 year old female admitted voluntarily, requesting detox from alcohol. She has a history of binge drinking which usually lasts a week or two with 4-8 years in between her episodes. On this occasion she has ben drinking wine, beer and vodka for about 1 week. She had previously been sober for 8 years which is her longest period of sobriety. She states that one of her stressors is her job. 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 She states that her job can be very demanding. She also states that her stressors are marital issues with her husband. Husband does not clean up the house, does not take care of their 6 dogs and the patient feels like the "man of the house" since her husband is now retired and does not help her out. She states that she does everything. Pt has a medical history of cerebral palsy and spinal fusion as a child. Patient also has a history of "withdrawal seizures" after her episodes of binge drinking. Her current CIWA is 0. Patient states that she feels fine and denies any withdrawal symptoms. Patient oriented to unit. Q15 min checks maintained for safety. Will continue to monitor pt.

## 2013-03-15 NOTE — Progress Notes (Signed)
Patient ID: Ashley Bradley, female   DOB: Dec 19, 1950, 62 y.o.   MRN: [id number] Patient can be admitted at our [facility] and can be programed in our Chemical dependence unit. Dahlia Byes  PMHNP-BC

## 2013-03-15 NOTE — ED Notes (Signed)
Pt has changed her mind and is wanting inpatient care at [facility]. Daughter at bedside.

## 2013-03-15 NOTE — ED Provider Notes (Signed)
Patient was [facility]. She now does not want to be admitted and wants to go home. The psychiatry team has evaluated her and said that she is okay for discharge.  Rolan Bucco, MD 03/15/13 1421

## 2013-03-15 NOTE — Tx Team (Signed)
Initial Interdisciplinary Treatment Plan  PATIENT STRENGTHS: (choose at least two) Ability for insight Active sense of humor Capable of independent living Communication skills General fund of knowledge Motivation for treatment/growth Supportive family/friends  PATIENT STRESSORS: Financial difficulties Marital or family conflict Occupational concerns Substance abuse   PROBLEM LIST: Problem List/Patient Goals Date to be addressed Date deferred Reason deferred Estimated date of resolution  Alcohol abuse                                                       DISCHARGE CRITERIA:  Ability to meet basic life and health needs Verbal commitment to aftercare and medication compliance Withdrawal symptoms are absent or subacute and managed without 24-hour nursing intervention  PRELIMINARY DISCHARGE PLAN: Attend 12-step recovery group Outpatient therapy Return to previous living arrangement Return to previous work or school arrangements  PATIENT/FAMIILY INVOLVEMENT: This treatment plan has been presented to and reviewed with the patient, Ashley Bradley, and/or family member.  The patient and family have been given the opportunity to ask questions and make suggestions.  Ashley Bradley T 03/15/2013, 9:08 PM

## 2013-03-15 NOTE — ED Notes (Signed)
Pt refused admission to [facility]. States it is too depressing over there. PA notified.

## 2013-03-15 NOTE — ED Notes (Signed)
Pt states she has a previous neck fusion and her neck is hurting with the one pillow. A warm blanket was given to roll under her neck.

## 2013-03-16 DIAGNOSIS — F322 Major depressive disorder, single episode, severe without psychotic features: Secondary | ICD-10-CM

## 2013-03-16 DIAGNOSIS — F10229 Alcohol dependence with intoxication, unspecified: Secondary | ICD-10-CM

## 2013-03-16 MED ORDER — LOPERAMIDE HCL 2 MG PO CAPS: 2.0000 mg | ORAL_CAPSULE | ORAL | Status: DC | PRN

## 2013-03-16 MED ORDER — CHLORDIAZEPOXIDE HCL 25 MG PO CAPS
25.0000 mg | ORAL_CAPSULE | Freq: Four times a day (QID) | ORAL | Status: AC
  Administered 2013-03-16 – 2013-03-17 (×5): 25 mg via ORAL
  Filled 2013-03-16 (×5): qty 1

## 2013-03-16 MED ORDER — HYDROXYZINE HCL 25 MG PO TABS
25.0000 mg | ORAL_TABLET | Freq: Four times a day (QID) | ORAL | Status: DC | PRN
  Administered 2013-03-16: 25 mg via ORAL

## 2013-03-16 MED ORDER — THIAMINE HCL 100 MG/ML IJ SOLN
100.0000 mg | Freq: Once | INTRAMUSCULAR | Status: AC
  Administered 2013-03-16: 100 mg via INTRAMUSCULAR

## 2013-03-16 MED ORDER — CHLORDIAZEPOXIDE HCL 25 MG PO CAPS: 25.0000 mg | ORAL_CAPSULE | Freq: Every day | ORAL | Status: DC

## 2013-03-16 MED ORDER — VITAMIN B-1 100 MG PO TABS
100.0000 mg | ORAL_TABLET | Freq: Every day | ORAL | Status: DC
  Administered 2013-03-17 – 2013-03-19 (×3): 100 mg via ORAL
  Filled 2013-03-16 (×5): qty 1

## 2013-03-16 MED ORDER — CHLORDIAZEPOXIDE HCL 25 MG PO CAPS
25.0000 mg | ORAL_CAPSULE | Freq: Four times a day (QID) | ORAL | Status: DC | PRN
  Administered 2013-03-16: 25 mg via ORAL
  Filled 2013-03-16: qty 1

## 2013-03-16 MED ORDER — ONDANSETRON 4 MG PO TBDP: 4.0000 mg | ORAL_TABLET | Freq: Four times a day (QID) | ORAL | Status: DC | PRN

## 2013-03-16 MED ORDER — CHLORDIAZEPOXIDE HCL 25 MG PO CAPS
25.0000 mg | ORAL_CAPSULE | ORAL | Status: AC
  Administered 2013-03-17 – 2013-03-19 (×2): 25 mg via ORAL
  Filled 2013-03-16: qty 1

## 2013-03-16 MED ORDER — ADULT MULTIVITAMIN W/MINERALS CH
1.0000 | ORAL_TABLET | Freq: Every day | ORAL | Status: DC
  Administered 2013-03-17 – 2013-03-19 (×3): 1 via ORAL
  Filled 2013-03-16 (×5): qty 1

## 2013-03-16 MED ORDER — CHLORDIAZEPOXIDE HCL 25 MG PO CAPS
25.0000 mg | ORAL_CAPSULE | Freq: Three times a day (TID) | ORAL | Status: AC
  Administered 2013-03-18 (×3): 25 mg via ORAL
  Filled 2013-03-16 (×4): qty 1

## 2013-03-16 NOTE — Progress Notes (Signed)
Adult Psychoeducational Group Note  Date:  03/16/2013 Time:  1300  Group Topic/Focus:  Making Healthy Choices:   The focus of this group is to help patients identify negative/unhealthy choices they were using prior to admission and identify positive/healthier coping strategies to replace them upon discharge.  Participation Level:  Active  Participation Quality:  Appropriate and Attentive  Affect:  Appropriate and Depressed  Cognitive:  Alert and Appropriate  Insight: Improving  Engagement in Group:  Engaged  Modes of Intervention:  Discussion and Education  Additional Comments:  Pt is engaged in learning about effective life coping skills.  Kaymen Adrian Shari Prows 03/16/2013, 2:17 PM

## 2013-03-16 NOTE — Progress Notes (Signed)
Psychoeducational Group Note  Date:  03/15/2013 Time:  2000  Group Topic/Focus:  Wrap-Up Group:   The focus of this group is to help patients review their daily goal of treatment and discuss progress on daily workbooks.  Participation Level: Did Not Attend  Participation Quality:  Not Applicable  Affect:  Not Applicable  Cognitive:  Not Applicable  Insight:  Not Applicable  Engagement in Group: Not Applicable  Additional Comments:  The patient did not attend group since she was sleeping in her bedroom.   Hazle Coca S 03/16/2013, 12:23 AM

## 2013-03-16 NOTE — [facility] Group Notes (Signed)
[facility] Group Notes:  (Clinical Social Work)  03/16/2013   3:00-4:00PM  Summary of Progress/Problems:   The main focus of today's process group was for the patient to identify ways in which they have sabotaged their own mental health wellness/recovery.  Motivational interviewing was used to explore the reasons they engage in this behavior, and reasons they may have for wanting to change.  The Stages of Change were explained to the group using a handout, and patients identified where they are with regard to changing self-defeating behaviors.  The patient expressed that she self sabotages by focusing on the needs of others and ignoring her own.  She thought she had made the Decision to change her self-sabotaging behavior and knew what changes were but after speaking with the doctor and attending group, she is now in Contemplation stage due to things that were brought out and discussed.  Type of Therapy:  Process Group  Participation Level:  Active  Participation Quality:  Appropriate, Attentive, Sharing and Supportive  Affect:  Depressed and Tearful  Cognitive:  Appropriate and Oriented  Insight:  Engaged  Engagement in Therapy:  Engaged  Modes of Intervention:  Education, Motivational Interviewing   Ambrose Mantle, LCSW 03/16/2013, 5:13 PM
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
## 2013-03-16 NOTE — Progress Notes (Signed)
Patient ID: Ashley Bradley, female   DOB: 1950-09-20, 62 y.o.   MRN: [id number] D: Pt is awake and active on the unit this AM. Pt denies SI/HI and A/V hallucinations. Pt rates their depression at 5 and hopelessness at 3. Pt's mood is depressed and her affect is sad. Pt writes that she plans to attend AA, take her medications and get a sponsor after d/c. Pt is preoccupied with leaving [employer] and states that she has to be back to work by Monday or will loose her job. Pt states that her employer spoke to her husband about the issue. Writer reminded pt that she will receive a letter stating her dates of admission.   A: Encouraged pt to discuss feelings with staff and administered medication per MD orders. Writer also encouraged pt to participate in groups.   R: Pt is attending groups and tolerating medications well. Writer will continue to monitor. 15 minute checks are ongoing for safety.

## 2013-03-16 NOTE — [facility] Suicide Risk Assessment (Signed)
Suicide Risk Assessment  Admission Assessment     Nursing information obtained from:  Patient Demographic factors:  Caucasian Current Mental Status:  NA Loss Factors:  NA Historical Factors:  NA Risk Reduction Factors:  Positive social support;Positive therapeutic relationship;Living with another person, especially a relative;Employed  CLINICAL FACTORS:   Depression:   Anhedonia Hopelessness Severe Alcohol/Substance Abuse/Dependencies  COGNITIVE FEATURES THAT CONTRIBUTE TO RISK:  Closed-mindedness Thought constriction (tunnel vision)    SUICIDE RISK:   Mild:  Suicidal ideation of limited frequency, intensity, duration, and specificity.  There are no identifiable plans, no associated intent, mild dysphoria and related symptoms, good self-control (both objective and subjective assessment), few other risk factors, and identifiable protective factors, including available and accessible social support.  PLAN OF CARE: AXIS I: Alcohol Intoxication with Use Disorder - Severe (F10.229)  Major Depressive Disorder - Severe (296.23)  AXIS II: No diagnosis  AXIS III:  Past Medical History   Diagnosis  Date   .  Hypertension    .  Alcoholism    .  Depression    AXIS IV: other psychosocial or environmental problems and problems with primary support group  AXIS V: 31-40 impairment in reality testing  Treatment Plan/Recommendations: None  Treatment Plan Summary:  Daily contact with patient to assess and evaluate symptoms and progress in treatment  Medication management  Current Medications:  Current Facility-Administered Medications   Medication  Dose  Route  Frequency  Provider  Last Rate  Last Dose   .  acetaminophen (TYLENOL) tablet 650 mg  650 mg  Oral  Q6H PRN  Earney Navy, NP   650 mg at 03/16/13 0641   .  alum & mag hydroxide-simeth (MAALOX/MYLANTA) 200-200-20 MG/5ML suspension 30 mL  30 mL  Oral  Q4H PRN  Earney Navy, NP     .  FLUoxetine (PROZAC) capsule 40 mg  40  mg  Oral  Daily  Earney Navy, NP   40 mg at 03/16/13 0802   .  folic acid (FOLVITE) tablet 1 mg  1 mg  Oral  Daily  Earney Navy, NP   1 mg at 03/16/13 0802   .  LORazepam (ATIVAN) tablet 1 mg  1 mg  Oral  Q6H PRN  Earney Navy, NP      Or   .  LORazepam (ATIVAN) injection 1 mg  1 mg  Intravenous  Q6H PRN  Earney Navy, NP     .  LORazepam (ATIVAN) tablet 0-4 mg  0-4 mg  Oral  Q6H  Earney Navy, NP      Followed by   .  [START ON 03/17/2013] LORazepam (ATIVAN) tablet 0-4 mg  0-4 mg  Oral  Q12H  Earney Navy, NP     .  losartan (COZAAR) tablet 50 mg  50 mg  Oral  Daily  Earney Navy, NP   50 mg at 03/16/13 0802   .  magnesium hydroxide (MILK OF MAGNESIA) suspension 30 mL  30 mL  Oral  Daily PRN  Earney Navy, NP     .  multivitamin with minerals tablet 1 tablet  1 tablet  Oral  Daily  Earney Navy, NP   1 tablet at 03/16/13 0802   .  thiamine (VITAMIN B-1) tablet 100 mg  100 mg  Oral  Daily  Earney Navy, NP   100 mg at 03/16/13 0802    Or   .  thiamine (B-1)  injection 100 mg  100 mg  Intravenous  Daily  Earney Navy, NP     .  traZODone (DESYREL) tablet 50 mg  50 mg  Oral  QHS PRN,MR X 1  Earney Navy, NP      Observation Level/Precautions: Detox  15 minute checks   Laboratory: Reviewed   Psychotherapy: Individual and group therapy.   Medications: Will continue detox protocol. CIWA   Consultations: None   Discharge Concerns: Relapse into drinking.   Estimated LOS: 3-5 days   Other: Patient     I certify that inpatient services furnished can reasonably be expected to improve the patient's condition.  Ashley Bradley 03/16/2013, 1:45 PM
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
## 2013-03-16 NOTE — Progress Notes (Signed)
Adult Psychoeducational Group Note  Date:  03/16/2013 Time:  0900  Group Topic/Focus:  Self Inventory  Participation Level:  Active  Participation Quality:  Appropriate and Sharing  Affect:  Depressed  Cognitive:  Appropriate  Insight: Lacking  Engagement in Group:  Engaged  Modes of Intervention:  Discussion  Additional Comments:  Pt is preoccupied with leaving [facility].  Sheela Mcculley Shari Prows 03/16/2013, 9:51 AM

## 2013-03-17 NOTE — Progress Notes (Signed)
Adult Psychoeducational Group Note  Date:  03/17/2013 Time:  1300  Group Topic/Focus:  Diagnosis Education:   The focus of this group is to discuss the major disorders that patients maybe diagnosed with.  Group discusses the importance of knowing what one's diagnosis is so that one can understand treatment and better advocate for oneself.  Participation Level:  Active  Participation Quality:  Appropriate and Attentive  Affect:  Appropriate and Depressed  Cognitive:  Alert and Appropriate  Insight: Improving  Engagement in Group:  Engaged  Modes of Intervention:  Discussion and Education  Additional Comments:  Pt listened and was attentive during group  Tyger Oka Shari Prows 03/17/2013, 3:00 PM

## 2013-03-17 NOTE — Progress Notes (Signed)
Nutrition Brief Note  Patient identified on the Malnutrition Screening Tool (MST) Report  Wt Readings from Last 15 Encounters:  03/15/13 131 lb (59.421 kg)    Body mass index is 21.8 kg/(m^2). Patient meets criteria for normal based on current BMI.   Current diet order is regular. Labs and medications reviewed.   Pt reports that she follows a very healthy diet at home and that she has had a good appetite. She does not need any nutritional supplements.   Reiterated the importance of a healthy diet with pt and the benefits of consuming 5 servings of fruits and vegetables per day. Pt replied that she currently does that.    No nutrition interventions warranted at this time. If nutrition issues arise, please consult RD.   Ebbie Latus RD, LDN

## 2013-03-17 NOTE — Progress Notes (Signed)
[facility] MD Progress Note  03/17/2013 2:35 PM Ashley Bradley  MRN:  [id number] Subjective:  Ashley Bradley is a pleasant 62 y/o  woman with a past psychiatric history of Alcohol Use Disorder, severe.   The patient is on the detox protocol and has been eating well.  She denies any withdrawal symptoms. She is eager for discharge, but I have advised her I would like her to stay to complete her detox protocol.  She states she has done a detox protocol at home in the past (as she was a Engineer, civil (consulting).)   . Location: The patient reports that she is doing well with her detox.   . Quality: In the area of affective symptoms, patient appears anxious and depressed. Patient denies auditory hallucinations. Patient denies visual hallucinations. Patient denies symptoms of paranoia. Patient states sleep is poor, with approximately 9 hours of sleep per night. Appetite is fai3. Energy level is good. Patient endorses symptoms of anhedonia. Patient endorses hopelessness, helplessness, or guilt.  . Severity:  Depression: 3/10 (0=Very depressed; 5=Neutral; 10=Very Happy)  Anxiety- 2/10 (0=no anxiety; 5= moderate/tolerable anxiety; 10= panic attacks)  . Duration: Symptoms have been present for about a year. . Timing: Most days.  . Context: Mostly job related stressors- she is learning to accept that she may need to take early retirement. . Modifying factors: Improving with group therapy. . Associated signs and symptoms (e.g., loss of appetite, loss of weight, loss of sexual interest)  Denies any recent episodes consistent with mania, particularly decreased need for sleep with increased energy, grandiosity, impulsivity, hyperverbal and pressured speech, or increased productivity. Denies any recent symptoms consistent with psychosis, particularly auditory or visual hallucinations, thought broadcasting/insertion/withdrawal, or ideas of reference. Also denies excessive worry to the point of physical symptoms as well as any panic attacks.  Denies any history of trauma or symptoms consistent with PTSD such as flashbacks, nightmares, hypervigilance, feelings of numbness or inability to connect with others   Diagnosis:   Substance/Addictive Disorders: Alcohol Intoxication with Use Disorder - Severe (F10.229)  Depressive Disorders: Major Depressive Disorder - Severe (296.23)  AXIS I: Alcohol Intoxication with Use Disorder - Severe (F10.229)  Major Depressive Disorder - Severe (296.23)  AXIS II: No diagnosis  AXIS III:  Past Medical History   Diagnosis  Date   .  Hypertension    .  Alcoholism    .  Depression    AXIS IV: other psychosocial or environmental problems and problems with primary support group  AXIS V: 31-40 impairment in reality testing  ADL's:  Intact  Sleep: Good  Appetite:  Good  Suicidal Ideation:  The patient contracts for safety on the unit. No self harm behaviors during this admission. Homicidal Ideation:  Plan:  Patient denies Intent:  Patient denies Means:  Patient denies AEB (as evidenced by):  Psychiatric Specialty Exam: ROS  Blood pressure 109/77, pulse 103, temperature 97.9 F (36.6 C), temperature source Oral, resp. rate 16, height 5\' 5"  (1.651 m), weight 59.421 kg (131 lb), SpO2 99.00%.Body mass index is 21.8 kg/(m^2).  General Appearance: Well Groomed  Patent attorney::  Good  Speech:  Clear and Coherent and Normal Rate  Volume:  Normal  Mood:  "relieved"  Affect:  Appropriate, Congruent and Inappropriate  Thought Process:  Coherent, Linear and Logical  Orientation:  Full (Time, Place, and Person)  Thought Content:  WDL  Suicidal Thoughts:  Patient continues to contract for safety on the unit.  Homicidal Thoughts:  No  Memory:  Immediate;  Good Recent;   Good Remote;   Good  Judgement:  Fair  Insight:  Good  Psychomotor Activity:  Normal  Concentration:  Good  Recall:  Good  Akathisia:  No  Handed:  Right  AIMS (if indicated):     Assets:  Communication Skills Desire for  Improvement Financial Resources/Insurance Housing Intimacy Social Support Talents/Skills Transportation Vocational/Educational  Sleep:  Number of Hours: 5.5   Current Medications: Current Facility-Administered Medications  Medication Dose Route Frequency Provider Last Rate Last Dose  . acetaminophen (TYLENOL) tablet 650 mg  650 mg Oral Q6H PRN Earney Navy, NP   650 mg at 03/17/13 0636  . alum & mag hydroxide-simeth (MAALOX/MYLANTA) 200-200-20 MG/5ML suspension 30 mL  30 mL Oral Q4H PRN Earney Navy, NP      . chlordiazePOXIDE (LIBRIUM) capsule 25 mg  25 mg Oral Q6H PRN Larena Sox, MD   25 mg at 03/16/13 1620  . chlordiazePOXIDE (LIBRIUM) capsule 25 mg  25 mg Oral QID Larena Sox, MD   25 mg at 03/17/13 1212   Followed by  . [START ON 03/18/2013] chlordiazePOXIDE (LIBRIUM) capsule 25 mg  25 mg Oral TID Larena Sox, MD       Followed by  . [START ON 03/19/2013] chlordiazePOXIDE (LIBRIUM) capsule 25 mg  25 mg Oral BH-qamhs Larena Sox, MD       Followed by  . [START ON 03/20/2013] chlordiazePOXIDE (LIBRIUM) capsule 25 mg  25 mg Oral Daily Larena Sox, MD      . FLUoxetine (PROZAC) capsule 40 mg  40 mg Oral Daily Earney Navy, NP   40 mg at 03/17/13 0817  . folic acid (FOLVITE) tablet 1 mg  1 mg Oral Daily Earney Navy, NP   1 mg at 03/17/13 0817  . hydrOXYzine (ATARAX/VISTARIL) tablet 25 mg  25 mg Oral Q6H PRN Larena Sox, MD   25 mg at 03/16/13 1621  . loperamide (IMODIUM) capsule 2-4 mg  2-4 mg Oral PRN Larena Sox, MD      . losartan (COZAAR) tablet 50 mg  50 mg Oral Daily Earney Navy, NP   50 mg at 03/17/13 0817  . magnesium hydroxide (MILK OF MAGNESIA) suspension 30 mL  30 mL Oral Daily PRN Earney Navy, NP      . multivitamin with minerals tablet 1 tablet  1 tablet Oral Daily Larena Sox, MD   1 tablet at 03/17/13 0817  . ondansetron (ZOFRAN-ODT) disintegrating tablet 4 mg  4 mg Oral Q6H PRN Larena Sox,  MD      . thiamine (VITAMIN B-1) tablet 100 mg  100 mg Oral Daily Larena Sox, MD   100 mg at 03/17/13 0817  . traZODone (DESYREL) tablet 50 mg  50 mg Oral QHS PRN,MR X 1 Earney Navy, NP        Lab Results:  Results for orders placed during the hospital encounter of 03/15/13 (from the past 48 hour(s))  TSH     Status: None   Collection Time    03/16/13  6:34 AM      Result Value Range   TSH 3.994  0.350 - 4.500 uIU/mL   Comment: Performed at [facility]    Physical Findings: AIMS: Facial and Oral Movements Muscles of Facial Expression: None, normal Lips and Perioral Area: None, normal Jaw: None, normal Tongue: None, normal,Extremity Movements Upper (arms, wrists, hands, fingers): None, normal Lower (legs,  knees, ankles, toes): None, normal, Trunk Movements Neck, shoulders, hips: None, normal, Overall Severity Severity of abnormal movements (highest score from questions above): None, normal Incapacitation due to abnormal movements: None, normal Patient's awareness of abnormal movements (rate only patient's report): No Awareness, Dental Status Current problems with teeth and/or dentures?: No Does patient usually wear dentures?: No  CIWA:  CIWA-Ar Total: 3 COWS:  COWS Total Score: 0  Treatment Plan Summary: Daily contact with patient to assess and evaluate symptoms and progress in treatment Medication management  Plan: Observation Level/Precautions: Detox  15 minute checks   Laboratory: Reviewed   Psychotherapy: Individual and group therapy.   Medications: Will continue detox protocol. CIWA   Consultations: None   Discharge Concerns: Relapse into drinking.   Estimated LOS: 3-5 days   Other: Patient will benefit from AA meetings.    Medical Decision Making Problem Points:  Established problem, stable/improving (1), Review of last therapy session (1) and Review of psycho-social stressors (1) Data Points:  Discuss tests with performing physician  (1) Review or order clinical lab tests (1) Review of medication regiment & side effects (2)  I certify that inpatient services furnished can reasonably be expected to improve the patient's condition.   Ayomikun Starling 03/17/2013, 2:35 PM

## 2013-03-17 NOTE — Progress Notes (Signed)
Patient ID: Ashley Bradley, female   DOB: Oct 24, 1950, 62 y.o.   MRN: [id number] D: Pt is awake and active on the unit this AM. Pt denies SI/HI and A/V hallucinations. Pt rates their depression at 4 and hopelessness at 2. Pt's mood is depressed and her affect is sad. Pt writes that she wants to avoid ETOH and get more exercise. Pt is participating well in the milieu and seems to be vested in tx.   A: Encouraged pt to discuss feelings with staff and administered medication per MD orders. Writer also encouraged pt to participate in groups.  R: Pt is attending groups and tolerating medications well. Writer will continue to monitor. 15 minute checks are ongoing for safety.

## 2013-03-17 NOTE — Progress Notes (Signed)
Psychoeducational Group Note  Date:  03/16/2013 Time:  2000  Group Topic/Focus:  Wrap-Up Group:   The focus of this group is to help patients review their daily goal of treatment and discuss progress on daily workbooks.  Participation Level: Did Not Attend  Participation Quality:  Not Applicable  Affect:  Not Applicable  Cognitive:  Not Applicable  Insight:  Not Applicable  Engagement in Group: Not Applicable  Additional Comments:  The patient didn't attend group since she was asleep.   Kaleeya Hancock S 03/17/2013, 1:43 AM

## 2013-03-17 NOTE — [facility] Group Notes (Signed)
[facility] Group Notes: (Clinical Social Work)   03/17/2013      Type of Therapy:  Group Therapy   Participation Level:  Did Not Attend - came and sat down for group but was quickly called out   Ambrose Mantle, LCSW 03/17/2013, 4:29 PM

## 2013-03-17 NOTE — Progress Notes (Signed)
Adult Psychoeducational Group Note  Date:  03/17/2013 Time:  0900  Group Topic/Focus:  Identifying Needs:   The focus of this group is to help patients identify their personal needs that have been historically problematic and identify healthy behaviors to address their needs.  Participation Level:  Minimal  Participation Quality:  Appropriate and Attentive  Affect:  Appropriate and Depressed  Cognitive:  Alert and Appropriate  Insight: Improving  Engagement in Group:  Limited  Modes of Intervention:  Discussion, Education and Exploration  Additional Comments:  Pt is quiet but attentive during group.  Berenice Oehlert Shari Prows 03/17/2013, 10:18 AM

## 2013-03-17 NOTE — Progress Notes (Signed)
D: Pt mood is depressed. Patient rates her depression 2/10. Patient is attending groups and interacts well within the milieu. Patient states that she is tired and has been in bed for most of this shift. A: Support given. Verbalization encouraged. Medication given as prescribed for withdrawal symptoms. Pt encouraged to come to nurse with any concerns.  R: Pt is receptive. No complaints of pain or discomfort at this time. Q15 min checks maintained for safety. Will continue to monitor pt

## 2013-03-18 MED ORDER — ARIPIPRAZOLE 5 MG PO TABS
5.0000 mg | ORAL_TABLET | Freq: Every day | ORAL | Status: DC
  Administered 2013-03-18 – 2013-03-19 (×2): 5 mg via ORAL
  Filled 2013-03-18 (×3): qty 1
  Filled 2013-03-18: qty 14
  Filled 2013-03-18 (×2): qty 1

## 2013-03-18 NOTE — Progress Notes (Signed)
Didn't attend group 

## 2013-03-18 NOTE — Progress Notes (Signed)
Psychoeducational Group Note  Date:  03/17/2013 Time:  2000  Group Topic/Focus:  Wrap-Up Group:   The focus of this group is to help patients review their daily goal of treatment and discuss progress on daily workbooks.  Participation Level: Did Not Attend  Participation Quality:  Not Applicable  Affect:  Not Applicable  Cognitive:  Not Applicable  Insight:  Not Applicable  Engagement in Group: Not Applicable  Additional Comments:  The patient did not attend group this evening.   Sherri Mcarthy S 03/18/2013, 1:41 AM

## 2013-03-18 NOTE — [facility] Suicide Risk Assessment (Signed)
[facility] INPATIENT:  Family/Significant Other Suicide Prevention Education  Suicide Prevention Education:  Education Completed; No one has been identified by the patient as the family member/significant other with whom the patient will be residing, and identified as the person(s) who will aid the patient in the event of a mental health crisis (suicidal ideations/suicide attempt).  With written consent from the patient, the family member/significant other has been provided the following suicide prevention education, prior to the and/or following the discharge of the patient.  The suicide prevention education provided includes the following:  Suicide risk factors  Suicide prevention and interventions  National Suicide Hotline telephone number  [facility] assessment telephone number  [facility] Emergency Assistance 911  [facility] and/or Residential Mobile Crisis Unit telephone number  Request made of family/significant other to:  Remove weapons (e.g., guns, rifles, knives), all items previously/currently identified as safety concern.    Remove drugs/medications (over-the-counter, prescriptions, illicit drugs), all items previously/currently identified as a safety concern.  The family member/significant other verbalizes understanding of the suicide prevention education information provided.  The family member/significant other agrees to remove the items of safety concern listed above.  The patient did not endorse SI at the time of admission, nor did the patient c/o SI during the stay here.  SPE not required.   Daryel Gerald B 03/18/2013, 3:06 PM

## 2013-03-18 NOTE — [facility] Group Notes (Signed)
[facility] LCSW Group Therapy  03/18/2013  1:15 PM   Type of Therapy:  Group Therapy  Participation Level:  Active  Participation Quality:  Appropriate and Attentive  Affect:  Appropriate and Calm  Cognitive:  Alert and Appropriate  Insight:  Developing/Improving and Engaged  Engagement in Therapy:  Developing/Improving and Engaged  Modes of Intervention:  Clarification, Confrontation, Discussion, Education, Exploration, Limit-setting, Orientation, Problem-solving, Rapport Building, Dance movement psychotherapist, Socialization and Support  Summary of Progress/Problems: Pt identified obstacles faced currently and processed barriers involved in overcoming these obstacles. Pt identified steps necessary for overcoming these obstacles and explored motivation (internal and external) for facing these difficulties head on. Pt further identified one area of concern in their lives and chose a goal to focus on for today.   Pt shared that her biggest obstacle is admitting she has depression and dealing with it before it gets overwhelming and out of hand.  Pt states that she tries to cover it up and show to others she is okay when she now realizes she needs to talk and open up to others, so they can help her.  Pt appeared to have good insight in regards to her diagnosis and overcoming this obstacle.  Pt actively participated and was engaged in group discussion.    Ashley Bradley, LCSWA 03/18/2013 2:42 PM

## 2013-03-18 NOTE — Progress Notes (Signed)
Patient ID: Ashley Bradley, female   DOB: 10-27-1950, 62 y.o.   MRN: [id number]  D: Patient pleasant with bright affect. Pt interacting well with peers on unit. A: Monitor Q 15 minutes for safety, encourage staff/peer interaction, medication compliance, and group participation. R: Pt denies SI/HI at this time. No s/s of withdrawal.

## 2013-03-18 NOTE — Progress Notes (Signed)
Patient ID: [name], female   DOB: 1951-05-06, 62 y.o.   MRN: [id number] [facility] MD Progress Note  03/18/2013 4:39 PM Ashley Bradley  MRN:  [id number]  Subjective: Ashley Bradley is a pleasant 62 y/o  woman with a past psychiatric history of Alcohol Use Disorder, severe. She notes that she relapsed after having big waves of depression that was not improved with the addition of Lamictal by her out patient provider. 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 She had been sober for 3 years before she relapsed this time.  Diagnosis:   Substance/Addictive Disorders: Alcohol Intoxication with Use Disorder - Severe (F10.229)  Depressive Disorders: Major Depressive Disorder - Severe (296.23)  AXIS I: Alcohol Intoxication with Use Disorder - Severe (F10.229)  Major Depressive Disorder - Severe (296.23)  AXIS II: No diagnosis  AXIS III:  Past Medical History   Diagnosis  Date   .  Hypertension    .  Alcoholism    .  Depression    AXIS IV: other psychosocial or environmental problems and problems with primary support group  AXIS V: 31-40 impairment in reality testing  ADL's:  Intact  Sleep: Good  Appetite:  Good  Suicidal Ideation:  The patient contracts for safety on the unit. No self harm behaviors during this admission. Homicidal Ideation:  Plan:  Patient denies Intent:  Patient denies Means:  Patient denies AEB (as evidenced by):  Psychiatric Specialty Exam: ROS  Blood pressure 119/78, pulse 64, temperature 97.6 F (36.4 C), temperature source Oral, resp. rate 18, height 5\' 5"  (1.651 m), weight 59.421 kg (131 lb), SpO2 99.00%.Body mass index is 21.8 kg/(m^2).  General Appearance: Well Groomed  Patent attorney::  Good  Speech:  Clear and Coherent and Normal Rate  Volume:  Normal  Mood:  "relieved"  Affect:  Appropriate, Congruent and Inappropriate  Thought Process:  Coherent, Linear and Logical  Orientation:  Full (Time, Place, and  Person)  Thought Content:  WDL  Suicidal Thoughts:  Patient continues to contract for safety on the unit.  Homicidal Thoughts:  No  Memory:  Immediate;   Good Recent;   Good Remote;   Good  Judgement:  Fair  Insight:  Good  Psychomotor Activity:  Normal  Concentration:  Good  Recall:  Good  Akathisia:  No  Handed:  Right  AIMS (if indicated):     Assets:  Communication Skills Desire for Improvement Financial Resources/Insurance Housing Intimacy Social Support Talents/Skills Transportation Vocational/Educational  Sleep:  Number of Hours: 6.75   Current Medications: Current Facility-Administered Medications  Medication Dose Route Frequency Provider Last Rate Last Dose  . acetaminophen (TYLENOL) tablet 650 mg  650 mg Oral Q6H PRN Earney Navy, NP   650 mg at 03/18/13 0853  . alum & mag hydroxide-simeth (MAALOX/MYLANTA) 200-200-20 MG/5ML suspension 30 mL  30 mL Oral Q4H PRN Earney Navy, NP      . chlordiazePOXIDE (LIBRIUM) capsule 25 mg  25 mg Oral Q6H PRN Larena Sox, MD   25 mg at 03/16/13 1620  . chlordiazePOXIDE (LIBRIUM) capsule 25 mg  25 mg Oral TID Larena Sox, MD   25 mg at 03/18/13 1148   Followed by  . [START ON 03/19/2013] chlordiazePOXIDE (LIBRIUM) capsule 25 mg  25 mg Oral BH-qamhs Larena Sox, MD   25 mg at 03/17/13 2105   Followed by  . [START ON 03/20/2013] chlordiazePOXIDE (LIBRIUM) capsule 25  mg  25 mg Oral Daily Larena Sox, MD      . FLUoxetine (PROZAC) capsule 40 mg  40 mg Oral Daily Earney Navy, NP   40 mg at 03/18/13 0831  . folic acid (FOLVITE) tablet 1 mg  1 mg Oral Daily Earney Navy, NP   1 mg at 03/18/13 0831  . hydrOXYzine (ATARAX/VISTARIL) tablet 25 mg  25 mg Oral Q6H PRN Larena Sox, MD   25 mg at 03/16/13 1621  . loperamide (IMODIUM) capsule 2-4 mg  2-4 mg Oral PRN Larena Sox, MD      . losartan (COZAAR) tablet 50 mg  50 mg Oral Daily Earney Navy, NP   50 mg at 03/18/13 0831  .  magnesium hydroxide (MILK OF MAGNESIA) suspension 30 mL  30 mL Oral Daily PRN Earney Navy, NP      . multivitamin with minerals tablet 1 tablet  1 tablet Oral Daily Larena Sox, MD   1 tablet at 03/18/13 0831  . ondansetron (ZOFRAN-ODT) disintegrating tablet 4 mg  4 mg Oral Q6H PRN Larena Sox, MD      . thiamine (VITAMIN B-1) tablet 100 mg  100 mg Oral Daily Larena Sox, MD   100 mg at 03/18/13 0831  . traZODone (DESYREL) tablet 50 mg  50 mg Oral QHS PRN,MR X 1 Earney Navy, NP        Lab Results:  No results found for this or any previous visit (from the past 48 hour(s)).  Physical Findings: AIMS: Facial and Oral Movements Muscles of Facial Expression: None, normal Lips and Perioral Area: None, normal Jaw: None, normal Tongue: None, normal,Extremity Movements Upper (arms, wrists, hands, fingers): None, normal Lower (legs, knees, ankles, toes): None, normal, Trunk Movements Neck, shoulders, hips: None, normal, Overall Severity Severity of abnormal movements (highest score from questions above): None, normal Incapacitation due to abnormal movements: None, normal Patient's awareness of abnormal movements (rate only patient's report): No Awareness, Dental Status Current problems with teeth and/or dentures?: No Does patient usually wear dentures?: No  CIWA:  CIWA-Ar Total: 0 COWS:  COWS Total Score: 0  Treatment Plan Summary: Daily contact with patient to assess and evaluate symptoms and progress in treatment Medication management  Plan: 1. Will start Abilify 5mg  to her current regimen for depression as she failed to respond to Lamictal. 2. Encouraged her to finish protocol for detox. 3. Anticipate D/C Tuesday or Wednesday.  Medical Decision Making Problem Points:  Established problem, stable/improving (1), Review of last therapy session (1) and Review of psycho-social stressors (1) Data Points:  Discuss tests with performing physician (1) Review or  order clinical lab tests (1) Review of medication regiment & side effects (2)  I certify that inpatient services furnished can reasonably be expected to improve the patient's condition.   Rona Ravens. Mashburn RPAC 4:43 PM 03/18/2013  Reviewed the information documented and agree with the treatment plan.  Philena Obey,JANARDHAHA R. 03/18/2013 5:48 PM

## 2013-03-18 NOTE — Tx Team (Signed)
Interdisciplinary Treatment Plan Update (Adult)  Date: 03/18/2013  Time Reviewed:  9:45 AM  Progress in Treatment: Attending groups: Yes Participating in groups:  Yes Taking medication as prescribed:  Yes Tolerating medication:  Yes Family/Significant othe contact made: CSW assessing  Patient understands diagnosis:  Yes Discussing patient identified problems/goals with staff:  Yes Medical problems stabilized or resolved:  Yes Denies suicidal/homicidal ideation: Yes Issues/concerns per patient self-inventory:  Yes Other:  New problem(s) identified: N/A  Discharge Plan or Barriers: CSW assessing for appropriate referrals.  Reason for Continuation of Hospitalization: Anxiety Depression Medication Stabilization  Comments: N/A  Estimated length of stay: 3-5 days  For review of initial/current patient goals, please see plan of care.  Attendees: Patient:     Family:     Physician:  Dr. Johnalagadda 03/18/2013 12:40 PM   Nursing:   Carol Davis, RN 03/18/2013 12:40 PM   Clinical Social Worker:  Olivea Sonnen Horton, LCSWA 03/18/2013 12:40 PM   Other: Neil Mashburn, PA 03/18/2013 12:40 PM   Other:   Other:    Other:     Other:    Other:    Other:    Other:    Other:    Other:     Scribe for Treatment Team:   Horton, Akil Hoos Nicole, 03/18/2013 12:40 PM   

## 2013-03-18 NOTE — Progress Notes (Signed)
Pt states she is disappointed that she is not leaving today. She stated she attributed her drinking to not feeling good while taking lamictal and prozac. Pt is happy that the MD will consider bumping up her prozac. She has reconnected with her sponsor and plans to see them once she gets out. Pt has been sober for 8 years one time and 4 years another time.

## 2013-03-18 NOTE — [facility] Group Notes (Signed)
[facility] LCSW Aftercare Discharge Planning Group Note   03/18/2013 8:45 AM  Participation Quality:  Alert and Appropriate   Mood/Affect:  Appropriate and Calm  Depression Rating:  1  Anxiety Rating:  1  Thoughts of Suicide:  Pt denies SI/HI  Will you contract for safety?   Yes  Current AVH:  Pt denies   Plan for Discharge/Comments:  Pt attended discharge planning group and actively participated in group.  CSW provided pt with today's workbook.  Pt states that she will return home in [place] and follow up at [facility] for medication management and therapy.  No further needs voiced by pt at this time.    Transportation Means:  Pt reports access to transportation - husband will pick pt up  Supports: No supports mentioned  Reyes Ivan, LCSWA 03/18/2013 9:55 AM

## 2013-03-18 NOTE — Progress Notes (Addendum)
D: Patient denies SI/HI/AVH. Patient rates hopelessness as 0,  depression as 1, and anxiety as 1.  Patient affect is appropriate. Mood is depressed.  Pt states, "I'm worried about where to go.  I don't know if I should go home or not.  I'm really tired, but excited to go home tomorrow."  Patient did NOT attend evening group. Patient visible on the milieu. No distress noted. A: Support and encouragement offered. Scheduled medications given to pt. Q 15 min checks continued for patient safety. R: Patient receptive. Patient remains safe on the unit.

## 2013-03-18 NOTE — Progress Notes (Signed)
Recreation Therapy Notes  Date: 09.15.2014 Time: 3:00pm Location: [address] Dayroom  Group Topic: Coping Skills  Goal Area(s) Addresses:  Patient will verbalize importance of recognizing emotions. Patient will identify at least one emotion. Patient will successfully represent varying emotions in pictures or words.   Behavioral Response: Did not attend.     Ashley Bradley, LRT/CTRS  Ashley Bradley 03/18/2013 4:53 PM 

## 2013-03-19 DIAGNOSIS — F1021 Alcohol dependence, in remission: Secondary | ICD-10-CM | POA: Diagnosis present

## 2013-03-19 DIAGNOSIS — F1010 Alcohol abuse, uncomplicated: Secondary | ICD-10-CM

## 2013-03-19 DIAGNOSIS — F329 Major depressive disorder, single episode, unspecified: Secondary | ICD-10-CM | POA: Diagnosis present

## 2013-03-19 MED ORDER — LOSARTAN POTASSIUM 50 MG PO TABS: 50.0000 mg | ORAL_TABLET | Freq: Every day | ORAL | Status: DC

## 2013-03-19 MED ORDER — FLUOXETINE HCL 40 MG PO CAPS: 40.0000 mg | ORAL_CAPSULE | Freq: Every day | ORAL | Status: DC

## 2013-03-19 MED ORDER — ARIPIPRAZOLE 5 MG PO TABS: 5.0000 mg | ORAL_TABLET | Freq: Every day | ORAL | Status: DC

## 2013-03-19 NOTE — Consult Note (Signed)
Note agreed with after review

## 2013-03-19 NOTE — Progress Notes (Signed)
Patient ID: Ashley Bradley, female   DOB: [birth year], 62 y.o.   MRN: [id number] Patient discharged per physician order; patient denies SI/HI na d A/V hallucinations; patient denies all withdrawal symptoms; patient received samples, prescriptions and copy of AVS after it was reviewed; patient had no other questions or concerns at this time; patient verbalized and signed that she received all belongings; patient left the unit ambulatory with her husband

## 2013-03-19 NOTE — [facility] Suicide Risk Assessment (Signed)
[facility] INPATIENT:  Family/Significant Other Suicide Prevention Education  Suicide Prevention Education:  Patient Refusal for Family/Significant Other Suicide Prevention Education: The patient Ashley Bradley has refused to provide written consent for family/significant other to be provided Family/Significant Other Suicide Prevention Education during admission and/or prior to discharge.  Physician notified.  Patient was not endorsing SI on admission.  Wynn Banker 03/19/2013, 9:49 AM
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
## 2013-03-19 NOTE — Discharge Summary (Signed)
Physician Discharge Summary Note  Patient:  Ashley Bradley is an 62 y.o., female MRN:  [id number] DOB:  04/03/1951 Patient phone:  [phone] (home)  Patient address:   [address] [place] [address],   Date of Admission:  03/15/2013 Date of Discharge: 03/19/2013   Reason for Admission: alcohol relapse with MDD  Discharge Diagnoses: Principal Problem:   Alcohol intoxication in relapsed alcoholic Active Problems:   Major depression  ROS  DSM5: Assessment:  DSM5:  Substance/Addictive Disorders: Alcohol Intoxication with Use Disorder - Severe (F10.229)  Depressive Disorders: Major Depressive Disorder - Severe (296.23)  AXIS I: Alcohol Intoxication with Use Disorder - Severe (F10.229)  Major Depressive Disorder - Severe (296.23)  AXIS II: No diagnosis  AXIS III:  Past Medical History   Diagnosis  Date   .  Hypertension    .  Alcoholism    .  Depression    AXIS IV: other psychosocial or environmental problems and problems with primary support group  AXIS V: 31-40 impairment in reality testing Level of Care:  OP  Hospital Course:  Ashley Bradley was admitted voluntarily after she presented to the [facility] requesting assistance with detoxing from alcohol. She had been sober for 3 years but relapsed 7 days prior to coming to the ED due to worsening depression. She relates the depression is worse due to the increased stressors she feels from her job. 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      Ashley Bradley has a history of significant alcohol abuse and has had withdrawal seizures in the past. She had been on a 7 day binge drinking 1/2 of a fifth of vodka a day. She is followed by [facility] for her depression and reports that her medication had been changed recently and did not have any improvement.      Ashley Bradley was assessed and felt to be in  need of acute psychiatric hospitalization.       Upon admission to the unit she was seen by an MD with in the first 24 hours and evaluated. Her symptoms were identified and medication management was initiated. She was encouraged to participate in unit programming after she was oriented to the unit. A librium detox protocol was initiated.        Ashley Bradley responded well to supportive milieu and medication management. Abilify was added to her regimen of prozac which she had been on for years. Her response to treatment was monitored daily as was her withdrawal protocol. Her CIWA scores declined and she noted minimal withdrawal symptoms. Ashley Bradley was active and supportive in group therapy and was able to report she gained some insight from them.       By the day of discharge Ashley Bradley was in much improved condition. She noted that her symptoms of depression had decreased significantly or resolved. She had a much brighter optimistic affect, denied SI/HI and had no AVH. She also denied all symptoms of withdrawal and was motivated to pursue continued sobriety. Ashley Bradley was also pro active and very motivated to continue her medication and therapy upon discharge as well and hoped to follow up with Dr. Laury Deep as an outpatient.       Ashley Bradley was stable to be discharged home with plans to follow up as noted below. Consults:  None  Significant Diagnostic Studies:  labs: CBC, CMP, UA, UDS,  BAL  Discharge Vitals:   Blood pressure 127/83, pulse 85, temperature 98.1 F (36.7 C), temperature source Oral, resp. rate 16, height 5\' 5"  (1.651 m), weight 59.421 kg (131 lb), SpO2 99.00%. Body mass index is 21.8 kg/(m^2). Lab Results:   No results found for this or any previous visit (from the past 72 hour(s)).  Physical Findings: AIMS: Facial and Oral Movements Muscles of Facial Expression: None, normal Lips and Perioral Area: None, normal Jaw: None, normal Tongue: None, normal,Extremity Movements Upper (arms, wrists,  hands, fingers): None, normal Lower (legs, knees, ankles, toes): None, normal, Trunk Movements Neck, shoulders, hips: None, normal, Overall Severity Severity of abnormal movements (highest score from questions above): None, normal Incapacitation due to abnormal movements: None, normal Patient's awareness of abnormal movements (rate only patient's report): No Awareness, Dental Status Current problems with teeth and/or dentures?: No Does patient usually wear dentures?: No  CIWA:  CIWA-Ar Total: 0 COWS:  COWS Total Score: 0  Psychiatric Specialty Exam: See Psychiatric Specialty Exam and Suicide Risk Assessment completed by Attending Physician prior to discharge.  Discharge destination:  Home  Is patient on multiple antipsychotic therapies at discharge:  No   Has Patient had three or more failed trials of antipsychotic monotherapy by history:  No  Recommended Plan for Multiple Antipsychotic Therapies: NA  Discharge Orders   Future Orders Complete By Expires   Diet - low sodium heart healthy  As directed    Discharge instructions  As directed    Comments:     Take all of your medications as directed. Be sure to keep all of your follow up appointments.  If you are unable to keep your follow up appointment, call your Doctor's office to let them know, and reschedule.  Make sure that you have enough medication to last until your appointment. Be sure to get plenty of rest. Going to bed at the same time each night will help. Try to avoid sleeping during the day.  Increase your activity as tolerated. Regular exercise will help you to sleep better and improve your mental health. Eating a heart healthy diet is recommended. Try to avoid salty or fried foods. Be sure to avoid all alcohol and illegal drugs.   Increase activity slowly  As directed        Medication List    STOP taking these medications       amphetamine-dextroamphetamine 20 MG 24 hr capsule  Commonly known as:  ADDERALL XR       TAKE these medications     Indication   ARIPiprazole 5 MG tablet  Commonly known as:  ABILIFY  Take 1 tablet (5 mg total) by mouth daily. For adjuvant therapy for depression.   Indication:  Major Depressive Disorder     FLUoxetine 40 MG capsule  Commonly known as:  PROZAC  Take 1 capsule (40 mg total) by mouth daily. For depression.   Indication:  Depression     losartan 50 MG tablet  Commonly known as:  COZAAR  Take 1 tablet (50 mg total) by mouth daily. For hypertension.   Indication:  High Blood Pressure           Follow-up Information   Follow up with [facility] On 04/11/2013. (2:00 with Dr Laury Deep)    Contact information:    [address]   [place]  [phone]      Follow up with [facility] On 03/21/2013. (Thursday at 1:30 with Boneta Lucks.  Come at 1:00 with your admission packet.)    Contact information:   [address]  [facility] [phone]      Follow-up recommendations:   Activities: Resume activity as tolerated. Diet: Heart healthy low sodium diet Tests: Follow up testing will be determined by your out patient provider. Comments:    Total Discharge Time:  Greater than 30 minutes.  Signed:Neil T. Mashburn RPAC 10:54 AM 03/19/2013  Patient is evaluated for suicidal risk assessment, case discussed with treatment team, and discharge treatment plan developed. Reviewed the information documented and agree with the treatment plan.  Nehemiah Settle., MD 03/19/2013 1:16 PM

## 2013-03-19 NOTE — Progress Notes (Signed)
D: Pt in bed resting with eyes closed. Respirations even and unlabored. Pt appears to be in no signs of distress at this time. A: Q15min checks remains for this pt. R: Pt remains safe at this time.   

## 2013-03-19 NOTE — Progress Notes (Signed)
The focus of this group is to educate the patient on the purpose and policies of crisis stabilization and provide a format to answer questions about their admission.  The group details unit policies and expectations of patients while admitted. Patient was active in group.  She set short term goal of taking care of herself and not trying to manage others.  Her long term goal is to role model trust-worthy behavior so her family will trust her again.

## 2013-03-19 NOTE — Progress Notes (Signed)
[facility] Adult Case Management Discharge Plan :  Will you be returning to the same living situation after discharge: Yes,  Patient is returning home with her husand. At discharge, do you have transportation home?:Yes,  Patient will arranage transportation. Do you have the ability to pay for your medications:Yes,  Patient is able to afford medications.  Release of information consent forms completed and in the chart;  Patient's signature needed at discharge.  Patient to Follow up at: Follow-up Information   Follow up with [facility] On 04/11/2013. (2:00 with Dr Laury Deep)    Contact information:    [address]   [place]  [336] [phone]      Follow up with [facility] On 03/21/2013. (Thursday at 1:30 with Boneta Lucks.  Come at 1:00 with your admission packet.)    Contact information:   [address]  [facility] [336] [phone]      Patient denies SI/HI:   Patient no longer endorsing SI/HI or other thoughts of self harm.     Safety Planning and Suicide Prevention discussed:  .Reviewed with all patients during discharge planning group   Jossiah Smoak, [place] July 03/19/2013, 10:38 AM

## 2013-03-20 NOTE — Progress Notes (Signed)
Patient Discharge Instructions:  Next Level Care Provider Has Access to the EMR, 03/20/13 Records provided to [facility] via [facility]/Epic access.  Ashley Bradley, 03/20/2013, 4:15 PM

## 2013-03-21 ENCOUNTER — Ambulatory Visit (HOSPITAL_COMMUNITY): Payer: Self-pay | Admitting: Psychiatry

## 2013-04-11 ENCOUNTER — Ambulatory Visit (HOSPITAL_COMMUNITY): Payer: Self-pay | Admitting: Psychiatry

## 2013-09-20 ENCOUNTER — Emergency Department (HOSPITAL_COMMUNITY): Payer: 59

## 2013-09-20 ENCOUNTER — Other Ambulatory Visit: Payer: Self-pay

## 2013-09-20 ENCOUNTER — Inpatient Hospital Stay (HOSPITAL_COMMUNITY)
Admission: EM | Admit: 2013-09-20 | Discharge: 2013-09-24 | DRG: 101 | Disposition: A | Discharge status: Home Health | Payer: 59 | Attending: Internal Medicine | Admitting: Internal Medicine

## 2013-09-20 ENCOUNTER — Encounter (HOSPITAL_COMMUNITY): Payer: Self-pay | Admitting: Emergency Medicine

## 2013-09-20 DIAGNOSIS — F10929 Alcohol use, unspecified with intoxication, unspecified: Secondary | ICD-10-CM

## 2013-09-20 DIAGNOSIS — IMO0002 Reserved for concepts with insufficient information to code with codable children: Secondary | ICD-10-CM

## 2013-09-20 DIAGNOSIS — E871 Hypo-osmolality and hyponatremia: Secondary | ICD-10-CM | POA: Diagnosis present

## 2013-09-20 DIAGNOSIS — G809 Cerebral palsy, unspecified: Secondary | ICD-10-CM | POA: Diagnosis present

## 2013-09-20 DIAGNOSIS — G40909 Epilepsy, unspecified, not intractable, without status epilepticus: Secondary | ICD-10-CM | POA: Diagnosis present

## 2013-09-20 DIAGNOSIS — Z79899 Other long term (current) drug therapy: Secondary | ICD-10-CM

## 2013-09-20 DIAGNOSIS — F411 Generalized anxiety disorder: Secondary | ICD-10-CM | POA: Diagnosis present

## 2013-09-20 DIAGNOSIS — N390 Urinary tract infection, site not specified: Secondary | ICD-10-CM | POA: Diagnosis not present

## 2013-09-20 DIAGNOSIS — F1021 Alcohol dependence, in remission: Secondary | ICD-10-CM | POA: Diagnosis present

## 2013-09-20 DIAGNOSIS — F10229 Alcohol dependence with intoxication, unspecified: Secondary | ICD-10-CM | POA: Diagnosis present

## 2013-09-20 DIAGNOSIS — R569 Unspecified convulsions: Principal | ICD-10-CM | POA: Diagnosis present

## 2013-09-20 DIAGNOSIS — F329 Major depressive disorder, single episode, unspecified: Secondary | ICD-10-CM | POA: Diagnosis present

## 2013-09-20 DIAGNOSIS — E46 Unspecified protein-calorie malnutrition: Secondary | ICD-10-CM | POA: Diagnosis present

## 2013-09-20 DIAGNOSIS — I10 Essential (primary) hypertension: Secondary | ICD-10-CM | POA: Diagnosis present

## 2013-09-20 DIAGNOSIS — R29898 Other symptoms and signs involving the musculoskeletal system: Secondary | ICD-10-CM | POA: Diagnosis present

## 2013-09-20 DIAGNOSIS — A088 Other specified intestinal infections: Secondary | ICD-10-CM | POA: Diagnosis present

## 2013-09-20 HISTORY — DX: Unspecified convulsions: R56.9

## 2013-09-20 HISTORY — DX: Adverse effect of unspecified anesthetic, initial encounter: T41.45XA

## 2013-09-20 HISTORY — DX: Nausea with vomiting, unspecified: R11.2

## 2013-09-20 HISTORY — DX: Cerebral palsy, unspecified: G80.9

## 2013-09-20 HISTORY — DX: Other complications of anesthesia, initial encounter: T88.59XA

## 2013-09-20 HISTORY — DX: Other specified postprocedural states: Z98.890

## 2013-09-20 HISTORY — DX: Other specified postprocedural states: R11.2

## 2013-09-20 LAB — URINALYSIS, ROUTINE W REFLEX MICROSCOPIC
Bilirubin Urine: NEGATIVE
Glucose, UA: NEGATIVE mg/dL
Ketones, ur: 15 mg/dL — AB
Nitrite: NEGATIVE
Protein, ur: 30 mg/dL — AB
Specific Gravity, Urine: 1.016 (ref 1.005–1.030)
Urobilinogen, UA: 0.2 mg/dL (ref 0.0–1.0)
pH: 5.5 (ref 5.0–8.0)

## 2013-09-20 LAB — CREATININE, URINE, RANDOM: Creatinine, Urine: 48.34 mg/dL

## 2013-09-20 LAB — CBC WITH DIFFERENTIAL/PLATELET
BASOS ABS: 0 10*3/uL (ref 0.0–0.1)
Basophils Relative: 0 % (ref 0–1)
Eosinophils Absolute: 0 10*3/uL (ref 0.0–0.7)
Eosinophils Relative: 0 % (ref 0–5)
HEMATOCRIT: 34.6 % — AB (ref 36.0–46.0)
Hemoglobin: 12 g/dL (ref 12.0–15.0)
LYMPHS PCT: 8 % — AB (ref 12–46)
Lymphs Abs: 0.4 10*3/uL — ABNORMAL LOW (ref 0.7–4.0)
MCH: 30.2 pg (ref 26.0–34.0)
MCHC: 34.7 g/dL (ref 30.0–36.0)
MCV: 86.9 fL (ref 78.0–100.0)
MONO ABS: 0.6 10*3/uL (ref 0.1–1.0)
Monocytes Relative: 10 % (ref 3–12)
NEUTROS ABS: 4.3 10*3/uL (ref 1.7–7.7)
Neutrophils Relative %: 81 % — ABNORMAL HIGH (ref 43–77)
Platelets: 167 10*3/uL (ref 150–400)
RBC: 3.98 MIL/uL (ref 3.87–5.11)
RDW: 13.8 % (ref 11.5–15.5)
WBC: 5.3 10*3/uL (ref 4.0–10.5)

## 2013-09-20 LAB — BASIC METABOLIC PANEL
BUN: 17 mg/dL (ref 6–23)
BUN: 14 mg/dL (ref 6–23)
CALCIUM: 8 mg/dL — AB (ref 8.4–10.5)
CO2: 22 mEq/L (ref 19–32)
CO2: 24 mEq/L (ref 19–32)
CREATININE: 0.45 mg/dL — AB (ref 0.50–1.10)
Calcium: 8.1 mg/dL — ABNORMAL LOW (ref 8.4–10.5)
Chloride: 80 mEq/L — ABNORMAL LOW (ref 96–112)
Chloride: 81 mEq/L — ABNORMAL LOW (ref 96–112)
Creatinine, Ser: 0.45 mg/dL — ABNORMAL LOW (ref 0.50–1.10)
GFR calc Af Amer: >90 mL/min (ref >90)
GLUCOSE: 84 mg/dL (ref 70–99)
Glucose, Bld: 85 mg/dL (ref 70–99)
POTASSIUM: 3.8 meq/L (ref 3.7–5.3)
Potassium: 4.3 mEq/L (ref 3.7–5.3)
SODIUM: 122 meq/L — AB (ref 137–147)
SODIUM: 122 meq/L — AB (ref 137–147)

## 2013-09-20 LAB — COMPREHENSIVE METABOLIC PANEL
ALT: 93 U/L — ABNORMAL HIGH (ref 0–35)
AST: 172 U/L — ABNORMAL HIGH (ref 0–37)
Albumin: 3.7 g/dL (ref 3.5–5.2)
Alkaline Phosphatase: 191 U/L — ABNORMAL HIGH (ref 39–117)
BUN: 18 mg/dL (ref 6–23)
CO2: 22 mEq/L (ref 19–32)
Calcium: 8.4 mg/dL (ref 8.4–10.5)
Chloride: 77 mEq/L — ABNORMAL LOW (ref 96–112)
Creatinine, Ser: 0.46 mg/dL — ABNORMAL LOW (ref 0.50–1.10)
GFR calc Af Amer: >90 mL/min (ref >90)
GFR calc non Af Amer: >90 mL/min (ref >90)
Glucose, Bld: 95 mg/dL (ref 70–99)
Potassium: 4.2 mEq/L (ref 3.7–5.3)
Sodium: 120 mEq/L — CL (ref 137–147)
Total Bilirubin: 0.8 mg/dL (ref 0.3–1.2)
Total Protein: 7.2 g/dL (ref 6.0–8.3)

## 2013-09-20 LAB — RAPID URINE DRUG SCREEN, HOSP PERFORMED
AMPHETAMINES: NOT DETECTED
BARBITURATES: NOT DETECTED
Benzodiazepines: NOT DETECTED
Cocaine: NOT DETECTED
Opiates: NOT DETECTED
TETRAHYDROCANNABINOL: NOT DETECTED

## 2013-09-20 LAB — URINE MICROSCOPIC-ADD ON

## 2013-09-20 LAB — LIPASE, BLOOD: Lipase: 61 U/L — ABNORMAL HIGH (ref 11–59)

## 2013-09-20 LAB — I-STAT TROPONIN, ED: TROPONIN I, POC: 0.03 ng/mL (ref 0.00–0.08)

## 2013-09-20 LAB — URIC ACID: Uric Acid, Serum: 5.8 mg/dL (ref 2.4–7.0)

## 2013-09-20 LAB — MRSA PCR SCREENING: MRSA by PCR: NEGATIVE

## 2013-09-20 LAB — ETHANOL: Alcohol, Ethyl (B): 184 mg/dL — ABNORMAL HIGH (ref 0–11)

## 2013-09-20 MED ORDER — LORAZEPAM 2 MG/ML IJ SOLN
0.0000 mg | Freq: Four times a day (QID) | INTRAMUSCULAR | Status: AC
  Administered 2013-09-20: 4 mg via INTRAVENOUS
  Administered 2013-09-21 (×3): 1 mg via INTRAVENOUS
  Administered 2013-09-22: 2 mg via INTRAVENOUS
  Filled 2013-09-20 (×5): qty 1

## 2013-09-20 MED ORDER — LORAZEPAM 2 MG/ML IJ SOLN
0.0000 mg | Freq: Two times a day (BID) | INTRAMUSCULAR | Status: DC
  Administered 2013-09-24: 2 mg via INTRAVENOUS
  Filled 2013-09-20: qty 1

## 2013-09-20 MED ORDER — CHLORDIAZEPOXIDE HCL 10 MG PO CAPS
10.0000 mg | ORAL_CAPSULE | Freq: Three times a day (TID) | ORAL | Status: DC
  Administered 2013-09-20 – 2013-09-22 (×6): 10 mg via ORAL
  Filled 2013-09-20 (×6): qty 1

## 2013-09-20 MED ORDER — LORAZEPAM 1 MG PO TABS
1.0000 mg | ORAL_TABLET | Freq: Four times a day (QID) | ORAL | Status: AC | PRN
  Administered 2013-09-21 – 2013-09-23 (×2): 1 mg via ORAL
  Filled 2013-09-20 (×2): qty 1

## 2013-09-20 MED ORDER — LORAZEPAM 2 MG/ML IJ SOLN
1.0000 mg | Freq: Once | INTRAMUSCULAR | Status: AC
  Administered 2013-09-20: 1 mg via INTRAVENOUS
  Filled 2013-09-20: qty 1

## 2013-09-20 MED ORDER — FOLIC ACID 1 MG PO TABS
1.0000 mg | ORAL_TABLET | Freq: Every day | ORAL | Status: DC
  Administered 2013-09-20 – 2013-09-23 (×4): 1 mg via ORAL
  Filled 2013-09-20 (×5): qty 1

## 2013-09-20 MED ORDER — HYDROCODONE-ACETAMINOPHEN 5-325 MG PO TABS: 1.0000 | ORAL_TABLET | ORAL | Status: DC | PRN

## 2013-09-20 MED ORDER — ONDANSETRON HCL 4 MG/2ML IJ SOLN
4.0000 mg | Freq: Four times a day (QID) | INTRAMUSCULAR | Status: DC | PRN
  Administered 2013-09-20 – 2013-09-24 (×2): 4 mg via INTRAVENOUS
  Filled 2013-09-20 (×2): qty 2

## 2013-09-20 MED ORDER — GUAIFENESIN-DM 100-10 MG/5ML PO SYRP: 5.0000 mL | ORAL_SOLUTION | ORAL | Status: DC | PRN

## 2013-09-20 MED ORDER — THIAMINE HCL 100 MG/ML IJ SOLN
100.0000 mg | Freq: Every day | INTRAMUSCULAR | Status: DC
  Filled 2013-09-20 (×2): qty 1

## 2013-09-20 MED ORDER — ONDANSETRON HCL 4 MG PO TABS
4.0000 mg | ORAL_TABLET | Freq: Four times a day (QID) | ORAL | Status: DC | PRN
  Administered 2013-09-23: 4 mg via ORAL
  Filled 2013-09-20: qty 1

## 2013-09-20 MED ORDER — MAGNESIUM SULFATE IN D5W 10-5 MG/ML-% IV SOLN
1.0000 g | Freq: Once | INTRAVENOUS | Status: AC
  Administered 2013-09-20: 1 g via INTRAVENOUS
  Filled 2013-09-20: qty 100

## 2013-09-20 MED ORDER — VITAMIN B-1 100 MG PO TABS
100.0000 mg | ORAL_TABLET | Freq: Every day | ORAL | Status: DC
Start: 2013-09-20 — End: 2013-09-24
  Administered 2013-09-21 – 2013-09-23 (×3): 100 mg via ORAL
  Filled 2013-09-20 (×5): qty 1

## 2013-09-20 MED ORDER — LORAZEPAM 2 MG/ML IJ SOLN: 1.0000 mg | Freq: Four times a day (QID) | INTRAMUSCULAR | Status: AC | PRN

## 2013-09-20 MED ORDER — POLYETHYLENE GLYCOL 3350 17 G PO PACK
17.0000 g | PACK | Freq: Every day | ORAL | Status: DC | PRN
  Filled 2013-09-20: qty 1

## 2013-09-20 MED ORDER — ADULT MULTIVITAMIN W/MINERALS CH
1.0000 | ORAL_TABLET | Freq: Every day | ORAL | Status: DC
  Administered 2013-09-20 – 2013-09-23 (×4): 1 via ORAL
  Filled 2013-09-20 (×5): qty 1

## 2013-09-20 MED ORDER — SODIUM CHLORIDE 0.9 % IV BOLUS (SEPSIS)
500.0000 mL | Freq: Once | INTRAVENOUS | Status: AC
  Administered 2013-09-20: 500 mL via INTRAVENOUS

## 2013-09-20 MED ORDER — SODIUM CHLORIDE 0.9 % IV SOLN: INTRAVENOUS | Status: DC

## 2013-09-20 MED ORDER — SODIUM CHLORIDE 0.9 % IJ SOLN
3.0000 mL | Freq: Two times a day (BID) | INTRAMUSCULAR | Status: DC
  Administered 2013-09-20 – 2013-09-24 (×7): 3 mL via INTRAVENOUS

## 2013-09-20 MED ORDER — SODIUM CHLORIDE 0.9 % IV SOLN
INTRAVENOUS | Status: DC
  Administered 2013-09-20: 14:00:00 via INTRAVENOUS

## 2013-09-20 MED ORDER — PNEUMOCOCCAL VAC POLYVALENT 25 MCG/0.5ML IJ INJ
0.5000 mL | INJECTION | INTRAMUSCULAR | Status: AC
  Administered 2013-09-21: 0.5 mL via INTRAMUSCULAR
  Filled 2013-09-20 (×2): qty 0.5

## 2013-09-20 MED ORDER — SODIUM CHLORIDE 0.9 % IV SOLN
INTRAVENOUS | Status: DC
  Administered 2013-09-20 – 2013-09-21 (×3): via INTRAVENOUS

## 2013-09-20 MED ORDER — THIAMINE HCL 100 MG/ML IJ SOLN
100.0000 mg | Freq: Every day | INTRAMUSCULAR | Status: DC
  Administered 2013-09-20: 100 mg via INTRAVENOUS
  Filled 2013-09-20: qty 2
  Filled 2013-09-20: qty 1

## 2013-09-20 NOTE — H&P (Signed)
Patient Demographics  Ashley Bradley, is a 63 y.o. female  MRN: [id number]   DOB - 1951/04/23  Admit Date - 09/20/2013  Outpatient Primary MD for the patient is No primary provider on file.   With History of -  Past Medical History  Diagnosis Date  . Hypertension   . Alcoholism   . Depression   . CP (cerebral palsy)   . Seizures     ETOH induced  . Complication of anesthesia     hard time waking up  . PONV (postoperative nausea and vomiting)       Past Surgical History  Procedure Laterality Date  . Breast surgery    . Tubal ligation    . Bladder surgery      in for   Chief Complaint  Patient presents with  . Alcohol Intoxication     HPI  Ashley Bradley  is a 63 y.o. female, history of cerebral palsy with some right-sided leg weakness and stiffness at baseline, depression, alcohol abuse, alcohol intoxication seizure in the past, hypertension, who's been on an alcoholic binge for the last several days and drinking about a bottle of wine each day given to her by her husband, she experienced an episode of seizure last night and this morning with generalized shaking along with bowel and bladder incontinence, after her second seizure today she was brought in the hospital by EMS.   In the ER Head CT scan and CBC were unremarkable BMP shows a sodium of 120, alcohol level is greater than 180 and I was called to admit the patient. Note a few months ago her sodium was 134. She also reports that she has not been eating or drinking well for the last 3-4 days, had an episode of vomiting this morning and upon presentation to the ER was covered in vomitus. She has some chest wall tenderness and pain around her substernal area, diffuse body aches all over, denies any headache. No shortness of breath or palpitations, no  fever chills, no abdominal pain or diarrhea no new focal weakness.    Review of Systems    In addition to the HPI above  No Fever-chills, No Headache, No changes with Vision or hearing, No problems swallowing food or Liquids, No Chest pain, Cough or Shortness of Breath, No Abdominal pain, No Nausea or Vommitting, Bowel movements are regular, No Blood in stool or Urine, No dysuria, No new skin rashes or bruises, No new joints pains-aches, but some substernal chest wall pain and tenderness, diffuse body aches all over, No new weakness, tingling, numbness in any extremity, No recent weight gain or loss, No polyuria, polydypsia or polyphagia, No significant Mental Stressors.  A full 10 point Review of Systems was done, except as stated above, all other Review of Systems were negative.   Social History History  Substance Use Topics  . Smoking status: Never Smoker   . Smokeless tobacco: Never Used  . Alcohol Use:  Yes     Comment: 1 bottle wine per day x 2 weeks - 09/20/13      Family History Depression   in parents  Prior to Admission medications   Medication Sig Start Date End Date Taking? Authorizing Provider  FLUoxetine (PROZAC) 40 MG capsule Take 1 capsule (40 mg total) by mouth daily. For depression. 03/19/13  Yes Verne Spurr, PA-C  losartan (COZAAR) 50 MG tablet Take 1 tablet (50 mg total) by mouth daily. For hypertension. 03/19/13  Yes Verne Spurr, PA-C    Allergies  Allergen Reactions  . Macrodantin [Nitrofurantoin] Nausea And Vomiting    Physical Exam  Vitals  Blood pressure 144/69, pulse 98, temperature 97.9 F (36.6 C), temperature source Oral, resp. rate 18, SpO2 99.00%.   1. General poorly kept middle-aged white female lying in bed in NAD,     2. Normal affect and insight, Not Suicidal or Homicidal, Awake Alert, Oriented X 2.  3. No F.N deficits, ALL C.Nerves Intact, Strength 5/5 all 4 extremities, Sensation intact all 4 extremities, Plantars down  going.  4. Ears and Eyes appear Normal, Conjunctivae clear, PERRLA. Moist Oral Mucosa.  5. Supple Neck, No JVD, No cervical lymphadenopathy appriciated, No Carotid Bruits.  6. Symmetrical Chest wall movement, Good air movement bilaterally, CTAB.  7. RRR, No Gallops, Rubs or Murmurs, No Parasternal Heave.  8. Positive Bowel Sounds, Abdomen Soft, Non tender, No organomegaly appriciated,No rebound -guarding or rigidity.  9.  No Cyanosis, Normal Skin Turgor, No Skin Rash or Bruise.  10. Good muscle tone,  joints appear normal , no effusions, Normal ROM.  11. No Palpable Lymph Nodes in Neck or Axillae     Data Review  CBC  Recent Labs Lab 09/20/13 1300  WBC 5.3  HGB 12.0  HCT 34.6*  PLT 167  MCV 86.9  MCH 30.2  MCHC 34.7  RDW 13.8  LYMPHSABS 0.4*  MONOABS 0.6  EOSABS 0.0  BASOSABS 0.0   ------------------------------------------------------------------------------------------------------------------  Chemistries   Recent Labs Lab 09/20/13 1300  NA 120*  K 4.2  CL 77*  CO2 22  GLUCOSE 95  BUN 18  CREATININE 0.46*  CALCIUM 8.4  AST 172*  ALT 93*  ALKPHOS 191*  BILITOT 0.8   ------------------------------------------------------------------------------------------------------------------ CrCl is unknown because both a height and weight (above a minimum accepted value) are required for this calculation. ------------------------------------------------------------------------------------------------------------------ No results found for this basename: TSH, T4TOTAL, FREET3, T3FREE, THYROIDAB,  in the last 72 hours   Coagulation profile No results found for this basename: INR, PROTIME,  in the last 168 hours ------------------------------------------------------------------------------------------------------------------- No results found for this basename: DDIMER,  in the last 72  hours -------------------------------------------------------------------------------------------------------------------  Cardiac Enzymes No results found for this basename: CK, CKMB, TROPONINI, MYOGLOBIN,  in the last 168 hours ------------------------------------------------------------------------------------------------------------------ No components found with this basename: POCBNP,    ---------------------------------------------------------------------------------------------------------------  Urinalysis    Component Value Date/Time   COLORURINE YELLOW 01/31/2010 0701   APPEARANCEUR CLOUDY* 01/31/2010 0701   LABSPEC 1.020 01/31/2010 0701   PHURINE 6.0 01/31/2010 0701   GLUCOSEU NEGATIVE 01/31/2010 0701   HGBUR MODERATE* 01/31/2010 0701   BILIRUBINUR SMALL* 01/31/2010 0701   KETONESUR >80* 01/31/2010 0701   PROTEINUR 100* 01/31/2010 0701   UROBILINOGEN 1.0 01/31/2010 0701   NITRITE NEGATIVE 01/31/2010 0701   LEUKOCYTESUR NEGATIVE 01/31/2010 0701    ----------------------------------------------------------------------------------------------------------------  Imaging results:   Dg Chest 2 View  09/20/2013   CLINICAL DATA:  Alcohol abuse.  Seizure.  EXAM: CHEST  2 VIEW  COMPARISON:  01/31/2010  FINDINGS: Patchy bilateral lower lobe airspace opacities are noted, new since prior study. This could represent atelectasis or infiltrates/pneumonia. No effusions. Heart is normal size. No acute bony abnormality.  IMPRESSION: Bibasilar atelectasis or infiltrates.   Electronically Signed   By: Charlett NoseKevin  Dover M.D.   On: 09/20/2013 14:17   Ct Head Wo Contrast  09/20/2013   CLINICAL DATA:  Seizure today, generalized weakness, frontal headache  EXAM: CT HEAD WITHOUT CONTRAST  TECHNIQUE: Contiguous axial images were obtained from the base of the skull through the vertex without intravenous contrast.  COMPARISON:  CT HEAD W/O CM dated 01/31/2010  FINDINGS: Similar findings of mild atrophy with mild  prominence of the bifrontal extra-axial spaces. Unchanged bilateral basal ganglial calcifications. The gray-white differentiation is otherwise well maintained without CT evidence of acute large territory infarct. No intraparenchymal extra-axial mass or hemorrhage. Unchanged size and configuration of the ventricles and basilar cisterns. No midline shift. Limited visualization of the paranasal sinuses and mastoid air cells are normal. Regional soft tissues are normal. No displaced calvarial fracture.  IMPRESSION: Mild atrophy without acute intracranial process.   Electronically Signed   By: Simonne ComeJohn  Watts M.D.   On: 09/20/2013 14:28    My personal review of EKG: Rhythm NSR, no Acute ST changes    Assessment & Plan   1. Seizure. Could be alcohol intoxication versus hyponatremia or combination of both - will be admitted to step down, seizure precautions, will be placed on Librium, folic acid thiamine, IV Ativan as needed per CIWA protocol. She was counseled to quit alcohol.     2. Hyponatremia - will have to presume this is acute, last sodium few months ago was 134, certainly is an alcoholic binge for the last 2 weeks, however poor oral intake in 3-4 days with nausea and vomiting this morning makes things complex. For now we'll get a stat serum osmolality, urine sodium and osmolality, for now hold IV fluids. Have [facility]. We'll defer this problem to him. She might require hypertonic saline in the short-term. Will monitor BMP every 4 hours and monitor sodium levels closely.     3. Depression stable. Hold SSRI can cause hyponatremia through SIADH. She's not suicidal homicidal. Outpatient followup with psych.     4. Cerebral palsy with chronic right lower extremity weakness and rigidity. No acute issue.      DVT Prophylaxis   SCDs    AM Labs Ordered, also please review Full Orders  Family Communication: Admission, patients condition and plan of  care including tests being ordered have been discussed with the patient  who indicates understanding and agree with the plan and Code Status.  Code Status Full  Likely DC to  Home  Condition GUARDED   Time spent in minutes : 45    Shawnta Schlegel K M.D on 09/20/2013 at 3:18 PM  Between 7am to 7pm - Pager - [phone]  After 7pm go to [email or web address] - password [email or web address]  And look for the night coverage person covering me after hours  [facility] Office  [phone]

## 2013-09-20 NOTE — ED Notes (Signed)
Per EMS pt has ETOH on board, hx of substance abuse, been on binge x 2 weeks, hasn't taken meds x 1 week, hasn't eaten x 5 days, was in bed covered in urine and vomit, pt lives w/ husband which they are separated, per husband pt had seizure last night and then another this morning, stated lasted 15-20 seconds, pt has hx of depression, pt complaining of substernal chest pain, pain on palpation, 12-lead normal, hurts up in throat.

## 2013-09-20 NOTE — ED Notes (Signed)
Pt states she's been binge drinking x 2 weeks, states been drinking wine, last time had something to drink was right before EMS picked her up, pt a/o x 3, pt states had seizure about 6 am, states has seizures when withdrawing, pt also states having mid sternal chest pain.

## 2013-09-20 NOTE — ED Provider Notes (Signed)
CSN: [id number]     Arrival date & time 09/20/13  1156 History   First MD Initiated Contact with Patient 09/20/13 1258     Chief Complaint  Patient presents with  . Alcohol Intoxication    HPI Patient presents to the emergency room after having a seizure this morning. The patient has a history of alcohol abuse. She had been sober for years but started drinking 2 weeks ago after having an argument with a family member. Patient has also been feeling depressed but does not have thoughts of suicide or depression.  According to EMS, family states that she had not been eating well. Patient has been urinating and vomiting in herself. The patient apparently had a seizure last night and then this morning that lasted about 15-20 seconds. Patient is now also complaining of pain in her chest since her seizure. She denies any shortness of breath fevers for abdominal pain. Past Medical History  Diagnosis Date  . Hypertension   . Alcoholism   . Depression   . CP (cerebral palsy)   . Seizures     ETOH induced   Past Surgical History  Procedure Laterality Date  . Breast surgery    . Tubal ligation    . Bladder surgery     No family history on file. History  Substance Use Topics  . Smoking status: Never Smoker   . Smokeless tobacco: Never Used  . Alcohol Use: Yes     Comment: Patient reports last drink (1/2 fifth of vodka) of 7 day binge was today before coming to ED   OB History   Grav Para Term Preterm Abortions TAB SAB Ect Mult Living                 Review of Systems  All other systems reviewed and are negative.      Allergies  Macrodantin  Home Medications   Current Outpatient Rx  Name  Route  Sig  Dispense  Refill  . FLUoxetine (PROZAC) 40 MG capsule   Oral   Take 1 capsule (40 mg total) by mouth daily. For depression.   30 capsule   0   . losartan (COZAAR) 50 MG tablet   Oral   Take 1 tablet (50 mg total) by mouth daily. For hypertension.   30 tablet   0    BP  144/69  Pulse 98  Temp(Src) 97.9 F (36.6 C) (Oral)  Resp 18  SpO2 99% Physical Exam  Nursing note and vitals reviewed. Constitutional: No distress.  HENT:  Head: Normocephalic and atraumatic.  Right Ear: External ear normal.  Left Ear: External ear normal.  Eyes: Conjunctivae are normal. Right eye exhibits no discharge. Left eye exhibits no discharge. No scleral icterus.  Neck: Neck supple. No tracheal deviation present.  Cardiovascular: Normal rate, regular rhythm and intact distal pulses.   Pulmonary/Chest: Effort normal and breath sounds normal. No stridor. No respiratory distress. She has no wheezes. She has no rales. She exhibits tenderness and bony tenderness. She exhibits no laceration, no crepitus and no deformity.  Abdominal: Soft. Bowel sounds are normal. She exhibits no distension. There is no tenderness. There is no rebound and no guarding.  Musculoskeletal: She exhibits no edema and no tenderness.       Cervical back: Normal.       Thoracic back: Normal.       Lumbar back: Normal.  Neurological: She is alert. She has normal strength. No cranial nerve deficit (no facial droop,  extraocular movements intact, no slurred speech) or sensory deficit. She exhibits normal muscle tone. She displays no seizure activity. Coordination normal.  Skin: Skin is warm. No rash noted. She is not diaphoretic.  Psychiatric: She has a normal mood and affect.    ED Course  Procedures (including critical care time) CRITICAL CARE Performed by: Linwood Dibbles R Total critical care time: 35 Critical care time was exclusive of separately billable procedures and treating other patients. Critical care was necessary to treat or prevent imminent or life-threatening deterioration. Critical care was time spent personally by me on the following activities: development of treatment plan with patient and/or surrogate as well as nursing, discussions with consultants, evaluation of patient's response to  treatment, examination of patient, obtaining history from patient or surrogate, ordering and performing treatments and interventions, ordering and review of laboratory studies, ordering and review of radiographic studies, pulse oximetry and re-evaluation of patient's condition.  Labs Review Labs Reviewed  CBC WITH DIFFERENTIAL - Abnormal; Notable for the following:    HCT 34.6 (*)    Neutrophils Relative % 81 (*)    Lymphocytes Relative 8 (*)    Lymphs Abs 0.4 (*)    All other components within normal limits  COMPREHENSIVE METABOLIC PANEL - Abnormal; Notable for the following:    Sodium 120 (*)    Chloride 77 (*)    Creatinine, Ser 0.46 (*)    AST 172 (*)    ALT 93 (*)    Alkaline Phosphatase 191 (*)    All other components within normal limits  ETHANOL - Abnormal; Notable for the following:    Alcohol, Ethyl (B) 184 (*)    All other components within normal limits  LIPASE, BLOOD - Abnormal; Notable for the following:    Lipase 61 (*)    All other components within normal limits  URINE RAPID DRUG SCREEN (HOSP PERFORMED)  Rosezena Sensor, ED   Imaging Review Dg Chest 2 View  09/20/2013   CLINICAL DATA:  Alcohol abuse.  Seizure.  EXAM: CHEST  2 VIEW  COMPARISON:  01/31/2010  FINDINGS: Patchy bilateral lower lobe airspace opacities are noted, new since prior study. This could represent atelectasis or infiltrates/pneumonia. No effusions. Heart is normal size. No acute bony abnormality.  IMPRESSION: Bibasilar atelectasis or infiltrates.   Electronically Signed   By: Charlett Nose M.D.   On: 09/20/2013 14:17   Ct Head Wo Contrast  09/20/2013   CLINICAL DATA:  Seizure today, generalized weakness, frontal headache  EXAM: CT HEAD WITHOUT CONTRAST  TECHNIQUE: Contiguous axial images were obtained from the base of the skull through the vertex without intravenous contrast.  COMPARISON:  CT HEAD W/O CM dated 01/31/2010  FINDINGS: Similar findings of mild atrophy with mild prominence of the  bifrontal extra-axial spaces. Unchanged bilateral basal ganglial calcifications. The gray-white differentiation is otherwise well maintained without CT evidence of acute large territory infarct. No intraparenchymal extra-axial mass or hemorrhage. Unchanged size and configuration of the ventricles and basilar cisterns. No midline shift. Limited visualization of the paranasal sinuses and mastoid air cells are normal. Regional soft tissues are normal. No displaced calvarial fracture.  IMPRESSION: Mild atrophy without acute intracranial process.   Electronically Signed   By: Simonne Come M.D.   On: 09/20/2013 14:28    EKG Normal sinus rhythm rate 96 Prolonged QT interval Normal ST-T waves No prior EKG for comparison  Medications  thiamine (B-1) injection 100 mg (100 mg Intravenous Given 09/20/13 1321)  0.9 %  sodium chloride  infusion ( Intravenous New Bag/Given 09/20/13 1417)  LORazepam (ATIVAN) injection 1 mg (1 mg Intravenous Given 09/20/13 1321)  sodium chloride 0.9 % bolus 500 mL (0 mLs Intravenous Stopped 09/20/13 1445)   1247  Discussed findings with patient.  She remains alert and oriented. MDM   Final diagnoses:  Hyponatremia  Seizure  Alcohol intoxication   The patient has significant hyponatremia most likely associated with her alcohol abuse.  This along with her alcohol use could be contributing to her seizures.  Pt remains alert and oriented in the ED without confusion.  I do not feel she needs hypertonic saline at this time.  Will start ns infusion, admit for monitoring and treatment.    Celene KrasJon R Joell Usman, MD 09/20/13 [phone]

## 2013-09-20 NOTE — Consult Note (Signed)
ATLEY [place] 09/20/2013 Arita Miss Requesting Physician:  Thedore Mins  Reason for Consult:  Hyponatremia, Seizure HPI:  39F hx/o alcoholism, HTN, MDD admitted to [facility] ED after seizure last night and this AM found to have Na of 120 with 03/2013 value of 134.  Pt awake, alert, oriented, and conversant on exam.  Her medical chart carries hx/o seizures with EtOH use.  She endorses 2 weeks of binged drinking at least a bottle wine a day.  In the past 7d she states she has not eaten anything.  She has not taken any of her medications in past 2 weeks.   No HA, N/V, focal weakness.  Normal UOP.  No c/o at this time.  Balance of 12 systems is negative w/ exceptions as above  PMH  Past Medical History  Diagnosis Date  . Hypertension   . Alcoholism   . Depression   . CP (cerebral palsy)   . Seizures     ETOH induced  . Complication of anesthesia     hard time waking up  . PONV (postoperative nausea and vomiting)    PSH  Past Surgical History  Procedure Laterality Date  . Breast surgery    . Tubal ligation    . Bladder surgery     FH History reviewed. No pertinent family history. SH  reports that she has never smoked. She has never used smokeless tobacco. She reports that she drinks alcohol. She reports that she does not use illicit drugs. Allergies  Allergies  Allergen Reactions  . Macrodantin [Nitrofurantoin] Nausea And Vomiting   Home medications Prior to Admission medications   Medication Sig Start Date End Date Taking? Authorizing Provider  FLUoxetine (PROZAC) 40 MG capsule Take 1 capsule (40 mg total) by mouth daily. For depression. 03/19/13  Yes Verne Spurr, PA-C  losartan (COZAAR) 50 MG tablet Take 1 tablet (50 mg total) by mouth daily. For hypertension. 03/19/13  Yes Verne Spurr, PA-C    Current Medications Current Facility-Administered Medications  Medication Dose Route Frequency Provider Last Rate Last Dose  . chlordiazePOXIDE (LIBRIUM) capsule 10 mg  10 mg Oral TID  Leroy Sea, MD      . magnesium sulfate IVPB 1 g 100 mL  1 g Intravenous Once Leroy Sea, MD      . Melene Muller ON 09/21/2013] pneumococcal 23 valent vaccine (PNU-IMMUNE) injection 0.5 mL  0.5 mL Intramuscular Tomorrow-1000 Leroy Sea, MD      . thiamine (B-1) injection 100 mg  100 mg Intravenous Daily Celene Kras, MD   100 mg at 09/20/13 1321   Current Outpatient Prescriptions  Medication Sig Dispense Refill  . FLUoxetine (PROZAC) 40 MG capsule Take 1 capsule (40 mg total) by mouth daily. For depression.  30 capsule  0  . losartan (COZAAR) 50 MG tablet Take 1 tablet (50 mg total) by mouth daily. For hypertension.  30 tablet  0    CBC  Recent Labs Lab 09/20/13 1300  WBC 5.3  NEUTROABS 4.3  HGB 12.0  HCT 34.6*  MCV 86.9  PLT 167   Basic Metabolic Panel  Recent Labs Lab 09/20/13 1300  NA 120*  K 4.2  CL 77*  CO2 22  GLUCOSE 95  BUN 18  CREATININE 0.46*  CALCIUM 8.4    Physical Exam  Blood pressure 127/63, pulse 100, temperature 98.7 F (37.1 C), temperature source Oral, resp. rate 16, SpO2 95.00%. GEN: dissheveled, NAD. ENT: NCAT. No JVP EYES: EOMI CV: RRR PULM:  CTAB, nl wob ABD: s/nt/nd, obese SKIN: no rashes/lesions EXT:no LEE   Assessment 6F with known seizures related to EtOH admitted with several seizures in setting of ETOH binge with Na 120 but not witnessed by medical staff.  She denies taking SSRI in past 2 weeks.  She endorses no solute intake for past 7 days.  She is euvolemic.  I think this most likely is low solute euvolemic hypotonic hyponatremia where her water intake is in excess of her ability to exrete free water as a consequence of poor solute intake.  I don't think this is SIADH.  I think with NS replacement she should have an increase in her serum Na gradually.  Given the history of seziures and normal neurological exam I don't believe that she needs 3% saline treatment.    1. Euvolemic hyponatremia, presumed hypotonic 2/2 low solute  intake -- expect low serum Osms and low Urine osms with low urine Na 2. Seizures 3. Alcohol dependence with acute intoxication  PLAN 1. Agree with q4h BMPs.  Goal Na correct is <8 in next 24h, this can be difficult to control in low solute states.  If over correction would hold further saline 2. q4h BMPs 3. NS @ 6850mL/hr 4. Regular diet when cleared from seizure standpoint 5. Agree with Uosms, UNa, Serum Osms.  Checked uric acid, expect to be low  Sabra Heckyan Agostino Gorin MD 09/20/2013, 4:15 PM

## 2013-09-20 NOTE — Progress Notes (Signed)
UR completed 

## 2013-09-20 NOTE — ED Notes (Signed)
Bed: [facility] Expected date:  Expected time:  Means of arrival:  Comments: EMS-ETOH 

## 2013-09-21 DIAGNOSIS — F1010 Alcohol abuse, uncomplicated: Secondary | ICD-10-CM

## 2013-09-21 LAB — BASIC METABOLIC PANEL
BUN: 14 mg/dL (ref 6–23)
BUN: 12 mg/dL (ref 6–23)
BUN: 15 mg/dL (ref 6–23)
CHLORIDE: 83 meq/L — AB (ref 96–112)
CHLORIDE: 87 meq/L — AB (ref 96–112)
CO2: 25 mEq/L (ref 19–32)
CO2: 26 mEq/L (ref 19–32)
CO2: 26 meq/L (ref 19–32)
Calcium: 8.1 mg/dL — ABNORMAL LOW (ref 8.4–10.5)
Calcium: 8.2 mg/dL — ABNORMAL LOW (ref 8.4–10.5)
Calcium: 8.3 mg/dL — ABNORMAL LOW (ref 8.4–10.5)
Chloride: 86 mEq/L — ABNORMAL LOW (ref 96–112)
Creatinine, Ser: 0.48 mg/dL — ABNORMAL LOW (ref 0.50–1.10)
Creatinine, Ser: 0.5 mg/dL (ref 0.50–1.10)
Creatinine, Ser: 0.5 mg/dL (ref 0.50–1.10)
GFR calc Af Amer: >90 mL/min (ref >90)
GFR calc Af Amer: >90 mL/min (ref >90)
GFR calc non Af Amer: >90 mL/min (ref >90)
GLUCOSE: 90 mg/dL (ref 70–99)
Glucose, Bld: 85 mg/dL (ref 70–99)
Glucose, Bld: 85 mg/dL (ref 70–99)
POTASSIUM: 3.7 meq/L (ref 3.7–5.3)
Potassium: 3.8 mEq/L (ref 3.7–5.3)
Potassium: 3.9 mEq/L (ref 3.7–5.3)
SODIUM: 124 meq/L — AB (ref 137–147)
SODIUM: 127 meq/L — AB (ref 137–147)
Sodium: 127 mEq/L — ABNORMAL LOW (ref 137–147)

## 2013-09-21 LAB — OSMOLALITY: Osmolality: 282 mOsm/kg (ref 275–300)

## 2013-09-21 LAB — URINE CULTURE: Colony Count: 35000

## 2013-09-21 LAB — CBC
HCT: 31.3 % — ABNORMAL LOW (ref 36.0–46.0)
Hemoglobin: 10.6 g/dL — ABNORMAL LOW (ref 12.0–15.0)
MCH: 30.5 pg (ref 26.0–34.0)
MCHC: 33.9 g/dL (ref 30.0–36.0)
MCV: 89.9 fL (ref 78.0–100.0)
Platelets: 143 10*3/uL — ABNORMAL LOW (ref 150–400)
RBC: 3.48 MIL/uL — AB (ref 3.87–5.11)
RDW: 14.2 % (ref 11.5–15.5)
WBC: 4.4 10*3/uL (ref 4.0–10.5)

## 2013-09-21 LAB — MAGNESIUM: MAGNESIUM: 2.8 mg/dL — AB (ref 1.5–2.5)

## 2013-09-21 LAB — OSMOLALITY, URINE: Osmolality, Ur: 478 mOsm/kg (ref 390–1090)

## 2013-09-21 MED ORDER — VITAMINS A & D EX OINT
TOPICAL_OINTMENT | CUTANEOUS | Status: AC
  Filled 2013-09-21: qty 5

## 2013-09-21 MED ORDER — LEVETIRACETAM 250 MG PO TABS
250.0000 mg | ORAL_TABLET | Freq: Two times a day (BID) | ORAL | Status: DC
  Administered 2013-09-21 – 2013-09-23 (×5): 250 mg via ORAL
  Filled 2013-09-21 (×6): qty 1

## 2013-09-21 MED ORDER — SODIUM CHLORIDE 1 G PO TABS
1.0000 g | ORAL_TABLET | Freq: Three times a day (TID) | ORAL | Status: AC
  Administered 2013-09-21 – 2013-09-23 (×5): 1 g via ORAL
  Filled 2013-09-21 (×6): qty 1

## 2013-09-21 MED ORDER — FUROSEMIDE 40 MG PO TABS
40.0000 mg | ORAL_TABLET | Freq: Every day | ORAL | Status: DC
  Administered 2013-09-21: 40 mg via ORAL
  Filled 2013-09-21 (×2): qty 1

## 2013-09-21 NOTE — Progress Notes (Addendum)
[id number],

## 2013-09-21 NOTE — Progress Notes (Signed)
Hyponatremia  Alcoholism Seizures Plan: Limit free water, increase solute intake-add sodium tabs           Add furosemide PO  Subjective: Interval History: 2 cups of water, one bottle of water and 2 cups of sherbet at bed table, c/o some SOB  Objective: Vital signs in last 24 hours: Temp:  [98 F (36.7 C)-99.2 F (37.3 C)] 98.2 F (36.8 C) (03/21 1351) Pulse Rate:  [87-106] 94 (03/21 1351) Resp:  [16-24] 20 (03/21 1351) BP: (101-135)/(45-80) 112/69 mmHg (03/21 1351) SpO2:  [95 %-100 %] 97 % (03/21 1351) Weight:  [68 kg (149 lb 14.6 oz)] 68 kg (149 lb 14.6 oz) (03/21 0300) Weight change:   Intake/Output from previous day: 03/20 0701 - 03/21 0700 In: 1163.8 [I.V.:1063.8; IV Piggyback:100] Out: 1775 [Urine:1775] Intake/Output this shift: Total I/O In: 345 [P.O.:120; I.V.:225] Out: -   General appearance: alert, cooperative and slowed mentation Head: Normocephalic, without obvious abnormality, atraumatic, atrophic glossitis Resp: clear to auscultation bilaterally Cardio: regular rate and rhythm, S1, S2 normal, no murmur, click, rub or gallop Extremities: extremities normal, atraumatic, no cyanosis or edema  Lab Results:  Recent Labs  09/20/13 1300 09/21/13 0322  WBC 5.3 4.4  HGB 12.0 10.6*  HCT 34.6* 31.3*  PLT 167 143*   BMET:  Recent Labs  09/21/13 0322 09/21/13 0806  NA 127* 127*  K 3.8 3.7  CL 86* 87*  CO2 26 26  GLUCOSE 85 85  BUN 14 12  CREATININE 0.50 0.48*  CALCIUM 8.2* 8.3*   No results found for this basename: PTH,  in the last 72 hours Iron Studies: No results found for this basename: IRON, TIBC, TRANSFERRIN, FERRITIN,  in the last 72 hours Studies/Results: Dg Chest 2 View  09/20/2013   CLINICAL DATA:  Alcohol abuse.  Seizure.  EXAM: CHEST  2 VIEW  COMPARISON:  01/31/2010  FINDINGS: Patchy bilateral lower lobe airspace opacities are noted, new since prior study. This could represent atelectasis or infiltrates/pneumonia. No effusions. Heart is  normal size. No acute bony abnormality.  IMPRESSION: Bibasilar atelectasis or infiltrates.   Electronically Signed   By: Charlett NoseKevin  Dover M.D.   On: 09/20/2013 14:17   Ct Head Wo Contrast  09/20/2013   CLINICAL DATA:  Seizure today, generalized weakness, frontal headache  EXAM: CT HEAD WITHOUT CONTRAST  TECHNIQUE: Contiguous axial images were obtained from the base of the skull through the vertex without intravenous contrast.  COMPARISON:  CT HEAD W/O CM dated 01/31/2010  FINDINGS: Similar findings of mild atrophy with mild prominence of the bifrontal extra-axial spaces. Unchanged bilateral basal ganglial calcifications. The gray-white differentiation is otherwise well maintained without CT evidence of acute large territory infarct. No intraparenchymal extra-axial mass or hemorrhage. Unchanged size and configuration of the ventricles and basilar cisterns. No midline shift. Limited visualization of the paranasal sinuses and mastoid air cells are normal. Regional soft tissues are normal. No displaced calvarial fracture.  IMPRESSION: Mild atrophy without acute intracranial process.   Electronically Signed   By: Simonne ComeJohn  Watts M.D.   On: 09/20/2013 14:28   Scheduled: . chlordiazePOXIDE  10 mg Oral TID  . folic acid  1 mg Oral Daily  . levETIRAcetam  250 mg Oral BID  . LORazepam  0-4 mg Intravenous Q6H   Followed by  . [START ON 09/22/2013] LORazepam  0-4 mg Intravenous Q12H  . multivitamin with minerals  1 tablet Oral Daily  . sodium chloride  3 mL Intravenous Q12H  . thiamine  100  mg Oral Daily  . vitamin A & D         LOS: 1 day   Valentina Alcoser C 09/21/2013,4:41 PM

## 2013-09-21 NOTE — Progress Notes (Addendum)
[facility] PROGRESS NOTE  Ashley Bradley ZOX:[id number] DOB: 1951-04-11 DOA: 09/20/2013 PCP: No primary provider on file.  Assessment/Plan: 38F hx/o alcoholism, HTN, MDD, h/o seizures is admitted to [facility] ED after seizure, hypo NA  1. Hypo Na euvolemic; improving on IVF;  appreciate nephrology input   2. Seizures; h/o seizures; obtain EEG, start keppra due to multiple episodes, seizure while etoh intoxicated; per patient not always related to etoh withdrawal   3. Alcoholism; cont CIWA; recommended to cut down on etoh use; CM consultation   4. HTN BP stable off med; monitor   TF to [facility] on 3/21  Code Status: full Family Communication: d/w patient (indicate person spoken with, relationship, and if by phone, the number) Disposition Plan: pend clinical improvement    Consultants:  None   Procedures:  Pend EEG  Antibiotics:  none (indicate start date, and stop date if known)  HPI/Subjective: alert  Objective: Filed Vitals:   09/21/13 0700  BP:   Pulse: 93  Temp:   Resp: 21    Intake/Output Summary (Last 24 hours) at 09/21/13 0826 Last data filed at 09/21/13 0700  Gross per 24 hour  Intake 1163.75 ml  Output   1775 ml  Net -611.25 ml   Filed Weights   09/20/13 1630 09/21/13 0300  Weight: 68.9 kg (151 lb 14.4 oz) 68 kg (149 lb 14.6 oz)    Exam:   General:  alert  Cardiovascular: s1,s2 rrr  Respiratory: CTA BL  Abdomen: soft, nt,nd   Musculoskeletal: no LE edema   Data Reviewed: Basic Metabolic Panel:  Recent Labs Lab 09/20/13 1300 09/20/13 1535 09/20/13 1940 09/20/13 2356 09/21/13 0322  NA 120* 122* 122* 124* 127*  K 4.2 4.3 3.8 3.9 3.8  CL 77* 80* 81* 83* 86*  CO2 22 22 24 25 26   GLUCOSE 95 84 85 90 85  BUN 18 17 14 15 14   CREATININE 0.46* 0.45* 0.45* 0.50 0.50  CALCIUM 8.4 8.0* 8.1* 8.1* 8.2*  MG  --   --   --   --  2.8*   Liver Function Tests:  Recent Labs Lab 09/20/13 1300  AST 172*  ALT 93*  ALKPHOS 191*  BILITOT 0.8   PROT 7.2  ALBUMIN 3.7    Recent Labs Lab 09/20/13 1300  LIPASE 61*   No results found for this basename: AMMONIA,  in the last 168 hours CBC:  Recent Labs Lab 09/20/13 1300 09/21/13 0322  WBC 5.3 4.4  NEUTROABS 4.3  --   HGB 12.0 10.6*  HCT 34.6* 31.3*  MCV 86.9 89.9  PLT 167 143*   Cardiac Enzymes: No results found for this basename: CKTOTAL, CKMB, CKMBINDEX, TROPONINI,  in the last 168 hours BNP (last 3 results) No results found for this basename: PROBNP,  in the last 8760 hours CBG: No results found for this basename: GLUCAP,  in the last 168 hours  Recent Results (from the past 240 hour(s))  MRSA PCR SCREENING     Status: None   Collection Time    09/20/13  4:29 PM      Result Value Ref Range Status   MRSA by PCR NEGATIVE  NEGATIVE Final   Comment:            The GeneXpert MRSA Assay (FDA     approved for NASAL specimens     only), is one component of a     comprehensive MRSA colonization     surveillance program. It is not  intended to diagnose MRSA     infection nor to guide or     monitor treatment for     MRSA infections.     Studies: Dg Chest 2 View  09/20/2013   CLINICAL DATA:  Alcohol abuse.  Seizure.  EXAM: CHEST  2 VIEW  COMPARISON:  01/31/2010  FINDINGS: Patchy bilateral lower lobe airspace opacities are noted, new since prior study. This could represent atelectasis or infiltrates/pneumonia. No effusions. Heart is normal size. No acute bony abnormality.  IMPRESSION: Bibasilar atelectasis or infiltrates.   Electronically Signed   By: Ashley NoseKevin  Bradley M.D.   On: 09/20/2013 14:17   Ct Head Wo Contrast  09/20/2013   CLINICAL DATA:  Seizure today, generalized weakness, frontal headache  EXAM: CT HEAD WITHOUT CONTRAST  TECHNIQUE: Contiguous axial images were obtained from the base of the skull through the vertex without intravenous contrast.  COMPARISON:  CT HEAD W/O CM dated 01/31/2010  FINDINGS: Similar findings of mild atrophy with mild prominence of  the bifrontal extra-axial spaces. Unchanged bilateral basal ganglial calcifications. The gray-white differentiation is otherwise well maintained without CT evidence of acute large territory infarct. No intraparenchymal extra-axial mass or hemorrhage. Unchanged size and configuration of the ventricles and basilar cisterns. No midline shift. Limited visualization of the paranasal sinuses and mastoid air cells are normal. Regional soft tissues are normal. No displaced calvarial fracture.  IMPRESSION: Mild atrophy without acute intracranial process.   Electronically Signed   By: Simonne ComeJohn  Watts M.D.   On: 09/20/2013 14:28    Scheduled Meds: . chlordiazePOXIDE  10 mg Oral TID  . folic acid  1 mg Oral Daily  . LORazepam  0-4 mg Intravenous Q6H   Followed by  . [START ON 09/22/2013] LORazepam  0-4 mg Intravenous Q12H  . multivitamin with minerals  1 tablet Oral Daily  . pneumococcal 23 valent vaccine  0.5 mL Intramuscular Tomorrow-1000  . sodium chloride  3 mL Intravenous Q12H  . thiamine  100 mg Oral Daily   Or  . thiamine  100 mg Intravenous Daily  . vitamin A & D       Continuous Infusions: . sodium chloride 75 mL/hr at 09/21/13 0700    Principal Problem:   Seizure Active Problems:   Major depression   Alcohol intoxication in relapsed alcoholic   Hyponatremia    Time spent >35 minutes     Ashley Bradley, [name]  [facility] Pager [phone]. If 7PM-7AM, please contact night-coverage at [email or web address], password [email or web address] 09/21/2013, 8:26 AM  LOS: 1 day

## 2013-09-22 LAB — BASIC METABOLIC PANEL
BUN: 10 mg/dL (ref 6–23)
CALCIUM: 8.4 mg/dL (ref 8.4–10.5)
CO2: 30 mEq/L (ref 19–32)
Chloride: 90 mEq/L — ABNORMAL LOW (ref 96–112)
Creatinine, Ser: 0.49 mg/dL — ABNORMAL LOW (ref 0.50–1.10)
GFR calc Af Amer: >90 mL/min (ref >90)
GLUCOSE: 98 mg/dL (ref 70–99)
Potassium: 3.3 mEq/L — ABNORMAL LOW (ref 3.7–5.3)
Sodium: 131 mEq/L — ABNORMAL LOW (ref 137–147)

## 2013-09-22 LAB — MAGNESIUM: Magnesium: 2 mg/dL (ref 1.5–2.5)

## 2013-09-22 MED ORDER — PANTOPRAZOLE SODIUM 40 MG PO TBEC
40.0000 mg | DELAYED_RELEASE_TABLET | Freq: Every day | ORAL | Status: DC
  Administered 2013-09-22 – 2013-09-24 (×3): 40 mg via ORAL
  Filled 2013-09-22 (×3): qty 1

## 2013-09-22 MED ORDER — ALUM & MAG HYDROXIDE-SIMETH 200-200-20 MG/5ML PO SUSP: 15.0000 mL | Freq: Four times a day (QID) | ORAL | Status: DC | PRN

## 2013-09-22 MED ORDER — POTASSIUM CHLORIDE 20 MEQ/15ML (10%) PO LIQD
60.0000 meq | Freq: Two times a day (BID) | ORAL | Status: DC
  Administered 2013-09-22: 60 meq via ORAL
  Filled 2013-09-22 (×2): qty 45

## 2013-09-22 MED ORDER — ENSURE PUDDING PO PUDG
1.0000 | Freq: Three times a day (TID) | ORAL | Status: DC
  Administered 2013-09-22 – 2013-09-24 (×4): 1 via ORAL
  Filled 2013-09-22 (×8): qty 1

## 2013-09-22 MED ORDER — POTASSIUM CHLORIDE CRYS ER 20 MEQ PO TBCR
40.0000 meq | EXTENDED_RELEASE_TABLET | Freq: Two times a day (BID) | ORAL | Status: DC
  Administered 2013-09-22 – 2013-09-23 (×3): 40 meq via ORAL
  Filled 2013-09-22 (×6): qty 2

## 2013-09-22 MED ORDER — POTASSIUM CHLORIDE 20 MEQ PO PACK: 60.0000 meq | PACK | Freq: Two times a day (BID) | ORAL | Status: DC

## 2013-09-22 NOTE — Progress Notes (Addendum)
INITIAL NUTRITION ASSESSMENT  DOCUMENTATION CODES Per approved criteria  -Not Applicable   INTERVENTION: - Ensure Pudding TID, each provides 170 kcal, 4 g protein - Encouraged patient to choose colder/room temperature, soft, easy to swallow foods.  - Patient may benefit from SLP consult to assess swallow function.  NUTRITION DIAGNOSIS: Inadequate oral intake related to decreased appetite as evidenced by 25% meal intake.   Goal: Patient will meet >/=90% of estimated nutrition needs  Monitor:  PO intake, weight, labs  Reason for Assessment: Malnutrition screening tool  63 y.o. female  Admitting Dx: Seizure  ASSESSMENT: Patient with history of alcoholism, cerebral palsy, admitted with seizures after an alcoholic binge (1 bottle of wine daily) for the last 3-4 days. She reports poor appetite and vomiting over that time. She is unable to state if she has lost any weight.   Patient also with hyponatremia (low solute euvolemic hypotonic hyponatremia). Per nephrology, patient on limited free water.   Patient also complains of esophagitis, which she says has not been treated. She states that food feels like it gets stuck in her throat, she has difficultly swallowing, and she also has pain when swallowing.   Height: Ht Readings from Last 1 Encounters:  09/20/13 5\' 5"  (1.651 m)    Weight: Wt Readings from Last 1 Encounters:  09/22/13 147 lb 14.9 oz (67.1 kg)    Ideal Body Weight: 125 pounds  % Ideal Body Weight: 118%  Wt Readings from Last 10 Encounters:  09/22/13 147 lb 14.9 oz (67.1 kg)  03/15/13 131 lb (59.421 kg)    Usual Body Weight: Unknown  % Usual Body Weight:   BMI:  Body mass index is 24.62 kg/(m^2). Patient is normal weight.   Estimated Nutritional Needs: Kcal: 1500-1600 kcal Protein: 80-95 g Fluid: 2.0-2.1 L/day  Skin: Intact  Diet Order: Cardiac  EDUCATION NEEDS: -No education needs identified at this time   Intake/Output Summary (Last 24  hours) at 09/22/13 1406 Last data filed at 09/22/13 1326  Gross per 24 hour  Intake   1305 ml  Output   1900 ml  Net   -595 ml    Last BM: PTA   Labs:   Recent Labs Lab 09/21/13 0322 09/21/13 0806 09/22/13 0526  NA 127* 127* 131*  K 3.8 3.7 3.3*  CL 86* 87* 90*  CO2 26 26 30   BUN 14 12 10   CREATININE 0.50 0.48* 0.49*  CALCIUM 8.2* 8.3* 8.4  MG 2.8*  --  2.0  GLUCOSE 85 85 98    CBG (last 3)  No results found for this basename: GLUCAP,  in the last 72 hours  Scheduled Meds: . folic acid  1 mg Oral Daily  . levETIRAcetam  250 mg Oral BID  . LORazepam  0-4 mg Intravenous Q6H   Followed by  . LORazepam  0-4 mg Intravenous Q12H  . multivitamin with minerals  1 tablet Oral Daily  . pantoprazole  40 mg Oral Daily  . potassium chloride  40 mEq Oral BID  . sodium chloride  3 mL Intravenous Q12H  . sodium chloride  1 g Oral TID WC  . thiamine  100 mg Oral Daily    Continuous Infusions:   Past Medical History  Diagnosis Date  . Hypertension   . Alcoholism   . Depression   . CP (cerebral palsy)   . Seizures     ETOH induced  . Complication of anesthesia     hard time waking up  .  PONV (postoperative nausea and vomiting)     Past Surgical History  Procedure Laterality Date  . Breast surgery    . Tubal ligation    . Bladder surgery      Linnell FullingElyse Keshun Berrett, RD, LDN Pager #: [phone] After-Hours Pager #: [phone]

## 2013-09-22 NOTE — Progress Notes (Addendum)
[facility] PROGRESS NOTE  Ashley Bradley:[id number]:[id number] DOB: 1951-03-05 DOA: 09/20/2013 PCP: No primary provider on file.  Assessment/Plan: 33F hx/o alcoholism, HTN, MDD, h/o seizures [facility] after seizure, hypo NA  1. Hypo Na euvolemic; improving;  appreciate nephrology input   2. Seizures; h/o seizures; pend EEG, start keppra due to multiple episodes of seizures, some even while etoh intoxicated; per patient not always related to etoh withdrawal   3. Alcoholism; cont CIWA; taper librium: sedated;  -recommended to cut down on etoh use; CM consultation   4. HTN BP stable off med; monitor    Code Status: full Family Communication: d/w patient, called updated Breighner,Sam Spouse [phone] (indicate person spoken with, relationship, and if by phone, the number) Disposition Plan: 24-48 hours   Consultants:  None   Procedures:  Pend EEG  Antibiotics:  none (indicate start date, and stop date if known)  HPI/Subjective: alert  Objective: Filed Vitals:   09/22/13 0613  BP: 119/75  Pulse: 97  Temp: 98.6 F (37 C)  Resp: 20    Intake/Output Summary (Last 24 hours) at 09/22/13 1058 Last data filed at 09/22/13 0915  Gross per 24 hour  Intake    945 ml  Output   1900 ml  Net   -955 ml   Filed Weights   09/20/13 1630 09/21/13 0300 09/22/13 0613  Weight: 68.9 kg (151 lb 14.4 oz) 68 kg (149 lb 14.6 oz) 67.1 kg (147 lb 14.9 oz)    Exam:   General:  alert  Cardiovascular: s1,s2 rrr  Respiratory: CTA BL  Abdomen: soft, nt,nd   Musculoskeletal: no LE edema   Data Reviewed: Basic Metabolic Panel:  Recent Labs Lab 09/20/13 1940 09/20/13 2356 09/21/13 0322 09/21/13 0806 09/22/13 0526  NA 122* 124* 127* 127* 131*  K 3.8 3.9 3.8 3.7 3.3*  CL 81* 83* 86* 87* 90*  CO2 24 25 26 26 30   GLUCOSE 85 90 85 85 98  BUN 14 15 14 12 10   CREATININE 0.45* 0.50 0.50 0.48* 0.49*  CALCIUM 8.1* 8.1* 8.2* 8.3* 8.4  MG  --   --  2.8*  --  2.0    Liver Function Tests:  Recent Labs Lab 09/20/13 1300  AST 172*  ALT 93*  ALKPHOS 191*  BILITOT 0.8  PROT 7.2  ALBUMIN 3.7    Recent Labs Lab 09/20/13 1300  LIPASE 61*   No results found for this basename: AMMONIA,  in the last 168 hours CBC:  Recent Labs Lab 09/20/13 1300 09/21/13 0322  WBC 5.3 4.4  NEUTROABS 4.3  --   HGB 12.0 10.6*  HCT 34.6* 31.3*  MCV 86.9 89.9  PLT 167 143*   Cardiac Enzymes: No results found for this basename: CKTOTAL, CKMB, CKMBINDEX, TROPONINI,  in the last 168 hours BNP (last 3 results) No results found for this basename: PROBNP,  in the last 8760 hours CBG: No results found for this basename: GLUCAP,  in the last 168 hours  Recent Results (from the past 240 hour(s))  URINE CULTURE     Status: None   Collection Time    09/20/13  3:15 PM      Result Value Ref Range Status   Specimen Description URINE, CLEAN CATCH   Final   Special Requests NONE   Final   Culture  Setup Time     Final   Value: 09/20/2013 21:26     Performed at [facility]   Colony Count  Final   Value: 35,000 COLONIES/ML     Performed at [facility]   Culture     Final   Value: Multiple bacterial morphotypes present, none predominant. Suggest appropriate recollection if clinically indicated.     Performed at [facility]   Report Status 09/21/2013 FINAL   Final  MRSA PCR SCREENING     Status: None   Collection Time    09/20/13  4:29 PM      Result Value Ref Range Status   MRSA by PCR NEGATIVE  NEGATIVE Final   Comment:            The GeneXpert MRSA Assay (FDA     approved for NASAL specimens     only), is one component of a     comprehensive MRSA colonization     surveillance program. It is not     intended to diagnose MRSA     infection nor to guide or     monitor treatment for     MRSA infections.     Studies: Dg Chest 2 View  09/20/2013   CLINICAL DATA:  Alcohol abuse.  Seizure.  EXAM: CHEST  2 VIEW  COMPARISON:   01/31/2010  FINDINGS: Patchy bilateral lower lobe airspace opacities are noted, new since prior study. This could represent atelectasis or infiltrates/pneumonia. No effusions. Heart is normal size. No acute bony abnormality.  IMPRESSION: Bibasilar atelectasis or infiltrates.   Electronically Signed   By: Charlett Nose M.D.   On: 09/20/2013 14:17   Ct Head Wo Contrast  09/20/2013   CLINICAL DATA:  Seizure today, generalized weakness, frontal headache  EXAM: CT HEAD WITHOUT CONTRAST  TECHNIQUE: Contiguous axial images were obtained from the base of the skull through the vertex without intravenous contrast.  COMPARISON:  CT HEAD W/O CM dated 01/31/2010  FINDINGS: Similar findings of mild atrophy with mild prominence of the bifrontal extra-axial spaces. Unchanged bilateral basal ganglial calcifications. The gray-white differentiation is otherwise well maintained without CT evidence of acute large territory infarct. No intraparenchymal extra-axial mass or hemorrhage. Unchanged size and configuration of the ventricles and basilar cisterns. No midline shift. Limited visualization of the paranasal sinuses and mastoid air cells are normal. Regional soft tissues are normal. No displaced calvarial fracture.  IMPRESSION: Mild atrophy without acute intracranial process.   Electronically Signed   By: Simonne Come M.D.   On: 09/20/2013 14:28    Scheduled Meds: . chlordiazePOXIDE  10 mg Oral TID  . folic acid  1 mg Oral Daily  . levETIRAcetam  250 mg Oral BID  . LORazepam  0-4 mg Intravenous Q6H   Followed by  . LORazepam  0-4 mg Intravenous Q12H  . multivitamin with minerals  1 tablet Oral Daily  . potassium chloride  60 mEq Oral BID  . sodium chloride  3 mL Intravenous Q12H  . sodium chloride  1 g Oral TID WC  . thiamine  100 mg Oral Daily   Continuous Infusions:    Principal Problem:   Seizure Active Problems:   Major depression   Alcohol intoxication in relapsed alcoholic   Hyponatremia    Time spent  >35 minutes     Esperanza Sheets  [facility] Pager [phone]. If 7PM-7AM, please contact night-coverage at [email or web address], password [email or web address] 09/22/2013, 10:58 AM  LOS: 2 days

## 2013-09-22 NOTE — Progress Notes (Signed)
Hyponatremia  Alcoholism  Seizures  Hypokalemia Plan: Limit free water, increase solute intake-eat food Stop furosemide PO, K replace.  We will sign off  Subjective: Interval History: c/o difficulty with coordination  Objective: Vital signs in last 24 hours: Temp:  [98 F (36.7 C)-98.6 F (37 C)] 98.6 F (37 C) (03/22 4098) Pulse Rate:  [88-97] 97 (03/22 0613) Resp:  [20] 20 (03/22 0613) BP: (109-123)/(51-75) 119/75 mmHg (03/22 0613) SpO2:  [93 %-99 %] 95 % (03/22 0613) Weight:  [67.1 kg (147 lb 14.9 oz)] 67.1 kg (147 lb 14.9 oz) (03/22 1191) Weight change: -1.8 kg (-3 lb 15.5 oz)  Intake/Output from previous day: 03/21 0701 - 03/22 0700 In: 1170 [P.O.:270; I.V.:900] Out: 1700 [Urine:1700] Intake/Output this shift:    General appearance: alert and slowed mentation Resp: clear to auscultation bilaterally Cardio: regular rate and rhythm, S1, S2 normal, no murmur, click, rub or gallop Extremities: extremities normal, atraumatic, no cyanosis or edema  Lab Results:  Recent Labs  09/20/13 1300 09/21/13 0322  WBC 5.3 4.4  HGB 12.0 10.6*  HCT 34.6* 31.3*  PLT 167 143*   BMET:  Recent Labs  09/21/13 0806 09/22/13 0526  NA 127* 131*  K 3.7 3.3*  CL 87* 90*  CO2 26 30  GLUCOSE 85 98  BUN 12 10  CREATININE 0.48* 0.49*  CALCIUM 8.3* 8.4   No results found for this basename: PTH,  in the last 72 hours Iron Studies: No results found for this basename: IRON, TIBC, TRANSFERRIN, FERRITIN,  in the last 72 hours Studies/Results: Dg Chest 2 View  09/20/2013   CLINICAL DATA:  Alcohol abuse.  Seizure.  EXAM: CHEST  2 VIEW  COMPARISON:  01/31/2010  FINDINGS: Patchy bilateral lower lobe airspace opacities are noted, new since prior study. This could represent atelectasis or infiltrates/pneumonia. No effusions. Heart is normal size. No acute bony abnormality.  IMPRESSION: Bibasilar atelectasis or infiltrates.   Electronically Signed   By: Charlett Nose M.D.   On: 09/20/2013 14:17    Ct Head Wo Contrast  09/20/2013   CLINICAL DATA:  Seizure today, generalized weakness, frontal headache  EXAM: CT HEAD WITHOUT CONTRAST  TECHNIQUE: Contiguous axial images were obtained from the base of the skull through the vertex without intravenous contrast.  COMPARISON:  CT HEAD W/O CM dated 01/31/2010  FINDINGS: Similar findings of mild atrophy with mild prominence of the bifrontal extra-axial spaces. Unchanged bilateral basal ganglial calcifications. The gray-white differentiation is otherwise well maintained without CT evidence of acute large territory infarct. No intraparenchymal extra-axial mass or hemorrhage. Unchanged size and configuration of the ventricles and basilar cisterns. No midline shift. Limited visualization of the paranasal sinuses and mastoid air cells are normal. Regional soft tissues are normal. No displaced calvarial fracture.  IMPRESSION: Mild atrophy without acute intracranial process.   Electronically Signed   By: Simonne Come M.D.   On: 09/20/2013 14:28   Scheduled: . chlordiazePOXIDE  10 mg Oral TID  . folic acid  1 mg Oral Daily  . furosemide  40 mg Oral Daily  . levETIRAcetam  250 mg Oral BID  . LORazepam  0-4 mg Intravenous Q6H   Followed by  . LORazepam  0-4 mg Intravenous Q12H  . multivitamin with minerals  1 tablet Oral Daily  . sodium chloride  3 mL Intravenous Q12H  . sodium chloride  1 g Oral TID WC  . thiamine  100 mg Oral Daily     LOS: 2 days   Cleavon Goldman  C 09/22/2013,8:29 AM

## 2013-09-23 ENCOUNTER — Inpatient Hospital Stay (HOSPITAL_COMMUNITY)
Admit: 2013-09-23 | Discharge: 2013-09-23 | Disposition: A | Discharge status: Home / Self Care | Payer: 59 | Attending: Internal Medicine | Admitting: Internal Medicine

## 2013-09-23 LAB — URINE MICROSCOPIC-ADD ON

## 2013-09-23 LAB — URINALYSIS, ROUTINE W REFLEX MICROSCOPIC
BILIRUBIN URINE: NEGATIVE
Glucose, UA: NEGATIVE mg/dL
Ketones, ur: NEGATIVE mg/dL
Nitrite: POSITIVE — AB
Protein, ur: 100 mg/dL — AB
Specific Gravity, Urine: 1.013 (ref 1.005–1.030)
UROBILINOGEN UA: 1 mg/dL (ref 0.0–1.0)
pH: 8 (ref 5.0–8.0)

## 2013-09-23 LAB — BASIC METABOLIC PANEL
BUN: 8 mg/dL (ref 6–23)
CALCIUM: 8.8 mg/dL (ref 8.4–10.5)
CO2: 25 mEq/L (ref 19–32)
Chloride: 97 mEq/L (ref 96–112)
Creatinine, Ser: 0.46 mg/dL — ABNORMAL LOW (ref 0.50–1.10)
GFR calc Af Amer: >90 mL/min (ref >90)
GLUCOSE: 144 mg/dL — AB (ref 70–99)
POTASSIUM: 4.1 meq/L (ref 3.7–5.3)
Sodium: 135 mEq/L — ABNORMAL LOW (ref 137–147)

## 2013-09-23 LAB — MAGNESIUM: Magnesium: 1.9 mg/dL (ref 1.5–2.5)

## 2013-09-23 NOTE — Progress Notes (Signed)
[facility] PROGRESS NOTE  Ashley RatelDarlene S XXXMOORE Bradley:[id number] DOB: 1950/07/24 DOA: 09/20/2013 PCP: Sigmund HazelLisa miller, NP   Brief narrative 63 Y/O Female with hx/o alcoholism, HTN, MDD, h/o seizures [facility] and hyponatremia.  Assessment/Plan: Hyponatremia  secondary to alcoholism . euvolemic; improving; appreciate nephrology input . Recommend limiting free water and increase solute intake in food. Lasix dced.    Seizures Likely etoh induced given previous hx of etoh seizures and active alcoholism.   EEG negative for seizure activity. ( final read pending). D/c keppra started this admission.  . Alcoholism No signs of withdrawal or DTs. cont CIWA. Continue thiamine, foalte and MV Counseled on cessation. Refused help from SW    HTN  BP stable   Diarrhea  likely viral gastroenteritis. Reports her husband to be sick with diarrhea and vomiting as well. Will check stool for c diff.   Code Status: full Family Communication: none at bedside Disposition Plan:home in am if diarrhea improves   Consultants:  renal  Procedures:  none  Antibiotics:  none  HPI/Subjective: Had 6 episodes of diarrhea today. Discharge held. Reports dome abdominal discomfort  Objective: Filed Vitals:   09/23/13 1601  BP: 105/65  Pulse: 85  Temp: 98.3 F (36.8 C)  Resp: 18    Intake/Output Summary (Last 24 hours) at 09/23/13 1746 Last data filed at 09/23/13 1421  Gross per 24 hour  Intake    350 ml  Output   2651 ml  Net  -2301 ml   Filed Weights   09/21/13 0300 09/22/13 0613 09/23/13 0310  Weight: 68 kg (149 lb 14.6 oz) 67.1 kg (147 lb 14.9 oz) 67.7 kg (149 lb 4 oz)    Exam:   General:  Elderly female in NAD  HEENT: no pallor, moist mucosa chest: clear b/l, no added sounds  CVS: NS1&S2, no murmurs  Abd: soft, NT, ND, BS+  Ext: Warm, no edema   CNS: AAOX3, non focal    Data Reviewed: Basic Metabolic Panel:  Recent Labs Lab 09/20/13 2356  09/21/13 0322 09/21/13 0806 09/22/13 0526 09/23/13 0500  NA 124* 127* 127* 131* 135*  K 3.9 3.8 3.7 3.3* 4.1  CL 83* 86* 87* 90* 97  CO2 25 26 26 30 25   GLUCOSE 90 85 85 98 144*  BUN 15 14 12 10 8   CREATININE 0.50 0.50 0.48* 0.49* 0.46*  CALCIUM 8.1* 8.2* 8.3* 8.4 8.8  MG  --  2.8*  --  2.0 1.9   Liver Function Tests:  Recent Labs Lab 09/20/13 1300  AST 172*  ALT 93*  ALKPHOS 191*  BILITOT 0.8  PROT 7.2  ALBUMIN 3.7    Recent Labs Lab 09/20/13 1300  LIPASE 61*   No results found for this basename: AMMONIA,  in the last 168 hours CBC:  Recent Labs Lab 09/20/13 1300 09/21/13 0322  WBC 5.3 4.4  NEUTROABS 4.3  --   HGB 12.0 10.6*  HCT 34.6* 31.3*  MCV 86.9 89.9  PLT 167 143*   Cardiac Enzymes: No results found for this basename: CKTOTAL, CKMB, CKMBINDEX, TROPONINI,  in the last 168 hours BNP (last 3 results) No results found for this basename: PROBNP,  in the last 8760 hours CBG: No results found for this basename: GLUCAP,  in the last 168 hours  Recent Results (from the past 240 hour(s))  URINE CULTURE     Status: None   Collection Time    09/20/13  3:15 PM  Result Value Ref Range Status   Specimen Description URINE, CLEAN CATCH   Final   Special Requests NONE   Final   Culture  Setup Time     Final   Value: 09/20/2013 21:26     Performed at [facility]   Colony Count     Final   Value: 35,000 COLONIES/ML     Performed at [facility]   Culture     Final   Value: Multiple bacterial morphotypes present, none predominant. Suggest appropriate recollection if clinically indicated.     Performed at [facility]   Report Status 09/21/2013 FINAL   Final  MRSA PCR SCREENING     Status: None   Collection Time    09/20/13  4:29 PM      Result Value Ref Range Status   MRSA by PCR NEGATIVE  NEGATIVE Final   Comment:            The GeneXpert MRSA Assay (FDA     approved for NASAL specimens     only), is one component of a      comprehensive MRSA colonization     surveillance program. It is not     intended to diagnose MRSA     infection nor to guide or     monitor treatment for     MRSA infections.     Studies: No results found.  Scheduled Meds: . feeding supplement (ENSURE)  1 Container Oral TID BM  . folic acid  1 mg Oral Daily  . levETIRAcetam  250 mg Oral BID  . LORazepam  0-4 mg Intravenous Q12H  . multivitamin with minerals  1 tablet Oral Daily  . pantoprazole  40 mg Oral Daily  . potassium chloride  40 mEq Oral BID  . sodium chloride  3 mL Intravenous Q12H  . thiamine  100 mg Oral Daily   Continuous Infusions:     Time spent:25 MINUTES    Eddie North  [facility] Pager [phone]. If 7PM-7AM, please contact night-coverage at [email or web address], password [facility] 09/23/2013, 5:46 PM  LOS: 3 days

## 2013-09-23 NOTE — Progress Notes (Signed)
EEG Completed; Results Pending  

## 2013-09-23 NOTE — Progress Notes (Signed)
Clinical Social Work  CSW attempted to meet with patient in order to follow up re: SA resources. Patient reports she is napping and does not want to talk. CSW will follow up at later time.  [place] Ferdie Bak[place], [place] [phone]

## 2013-09-23 NOTE — Evaluation (Signed)
Physical Therapy Evaluation Patient Details Name: Ashley Bradley MRN: [id number] DOB: 1950/11/25 Today's Date: 09/23/2013 Time: 0272-5366 PT Time Calculation (min): 13 min  PT Assessment / Plan / Recommendation History of Present Illness  63 yo female admitted with hyponatremia, Sz. Hx of HTN, ETOH abuse, cerebral palsy, Sz d/o.   Clinical Impression  On eval, pt required Min assist for mobility-able to ambulate ~30 feet with walker. Pt is unsteady and at risk for falls. Mod encouragement for pt to use walker to improve stability. Demonstrates general weakness, decreased activity tolerance, and impaired gait and balance. Recommend HHPT, 24/7 supervision/assist. Pt states her husband can provide 24 hour care.     PT Assessment  Patient needs continued PT services    Follow Up Recommendations  Home health PT;Supervision/Assistance - 24 hour    Does the patient have the potential to tolerate intense rehabilitation      Barriers to Discharge        Equipment Recommendations  None recommended by PT    Recommendations for Other Services OT consult   Frequency Min 3X/week    Precautions / Restrictions Precautions Precautions: Fall;Other (comment) Precaution Comments: Sz precautions Restrictions Weight Bearing Restrictions: No   Pertinent Vitals/Pain No c/o pain      Mobility  Bed Mobility Overal bed mobility: Modified Independent Transfers Overall transfer level: Needs assistance Equipment used: Rolling walker (2 wheeled) Transfers: Sit to/from Stand Sit to Stand: Min assist General transfer comment: VCs safety, technique, hand placement. Assist to rise, stabilize, control descent Ambulation/Gait Ambulation/Gait assistance: Min assist Ambulation Distance (Feet): 30 Feet (x2) Assistive device: Rolling walker (2 wheeled) Gait Pattern/deviations: Trunk flexed;Decreased stride length;Decreased dorsiflexion - right;Decreased step length - right General Gait Details:  Ambulation in room only-pt declined to ambulate in hallway. Unsteady. Assist to stabilize throughout ambulation and to maneuver with walker. Fatigues easily. 1 seated rest break taken after ~30 feet.     Exercises     PT Diagnosis: Difficulty walking;Abnormality of gait;Generalized weakness  PT Problem List: Decreased strength;Decreased activity tolerance;Decreased balance;Decreased mobility;Decreased knowledge of use of DME PT Treatment Interventions: DME instruction;Gait training;Stair training;Functional mobility training;Therapeutic activities;Therapeutic exercise;Patient/family education;Balance training     PT Goals(Current goals can be found in the care plan section) Acute Rehab PT Goals Patient Stated Goal: home soon PT Goal Formulation: With patient Time For Goal Achievement: 10/07/13 Potential to Achieve Goals: Good  Visit Information  Last PT Received On: 09/23/13 Assistance Needed: +1 History of Present Illness: 63 yo female admitted with hyponatremia, Sz. Hx of HTN, ETOH abuse, cerebral palsy, Sz d/o.        Prior Functioning  Home Living Family/patient expects to be discharged to:: Private residence Living Arrangements: Spouse/significant other Type of Home: House Home Access: Stairs to enter Home Layout: Two level;Bed/bath upstairs Alternate Level Stairs-Number of Steps: 1 flight Home Equipment: Environmental consultant - 2 wheels Prior Function Level of Independence: Independent Communication Communication: No difficulties    Cognition  Cognition Arousal/Alertness: Awake/alert Behavior During Therapy: WFL for tasks assessed/performed Overall Cognitive Status: Within Functional Limits for tasks assessed    Extremity/Trunk Assessment Upper Extremity Assessment Upper Extremity Assessment: Overall WFL for tasks assessed Lower Extremity Assessment Lower Extremity Assessment: RLE deficits/detail RLE Deficits / Details: R sided weakness more proncounced. Hx of CP Cervical /  Trunk Assessment Cervical / Trunk Assessment: Kyphotic   Balance Balance Overall balance assessment: Needs assistance Standing balance support: Bilateral upper extremity supported;During functional activity Standing balance-Leahy Scale: Poor  End of Session PT -  End of Session Equipment Utilized During Treatment: Gait belt Activity Tolerance: Patient limited by fatigue Patient left: in bed;with call bell/phone within reach;with bed alarm set  GP     Rebeca AlertJannie Cedar Ditullio, MPT Pager: [phone]

## 2013-09-23 NOTE — Procedures (Signed)
History: 63 yo F with seizures in teh setting of substance abuse, hyponatremia  Sedation: None  Technique: This is a 17 channel routine scalp EEG performed at the bedside with bipolar and monopolar montages arranged in accordance to the international 10/20 system of electrode placement. One channel was dedicated to EKG recording.    Background: The background consists of irregular alpha and beta activities. There is a well defined posterior dominant rhythm of 11 Hz that attenuates with eye opening. No sleep was recorded. There is an excess of beta activity.   Photic stimulation: Physiologic driving is not performed  EEG Abnormalities: None  Clinical Interpretation: This normal EEG is recorded in the waking  state. There was no seizure or seizure predisposition recorded on this study. The excess beta activity is likely related to medication effect from benzodiazepines.   Ritta SlotMcNeill Daiki Dicostanzo, MD [facility] [phone]  If 7pm- 7am, please page neurology on call as listed in AMION.

## 2013-09-23 NOTE — Progress Notes (Signed)
Clinical Social Work Department BRIEF PSYCHOSOCIAL ASSESSMENT 09/23/2013  Patient:  Ashley Bradley, Ashley Bradley     Account Number:  [id number]     Admit date:  09/20/2013  Clinical Social Worker:  Earlie Server  Date/Time:  09/23/2013 11:30 AM  Referred by:  Physician  Date Referred:  09/23/2013 Referred for  Substance Abuse   Other Referral:   Interview type:  Patient Other interview type:    PSYCHOSOCIAL DATA Living Status:  FAMILY Admitted from facility:   Level of care:   Primary support name:  Sam Primary support relationship to patient:  SPOUSE Degree of support available:   Adequate    CURRENT CONCERNS Current Concerns  Substance Abuse   Other Concerns:    SOCIAL WORK ASSESSMENT / PLAN CSW received referral to assess patient's substance use. Per chart review, patient admitted due to alcohol withdrawal. CSW reviewed chart and met with patient at bedside. CSW introduced myself and explained role.    Patient reports that she lives at home with husband and they have 9 children between the two of them. Patient reports she came to the hospital due to binge drinking alcohol for the past two weeks. Patient reports she has a strained relationship with dtr and that in order to cope with feelings her husband has been buying her alcohol. Patient reports she has been drinking a bottle of wine on a daily basis for the past two weeks. Patient agreeable to complete SBIRT and to speak in further detail about substance use.    Patient reports her father was an alcoholic and she hated seeing him drunk. Patient did not start drinking until she went to college around the age of 24. Patient reports she drank off and on until she got married around 51 and then almost completely stopped drinking. Patient reports that she continued to drink socially but was sober for about 8 years. Patient has been drinking for the past several years but feels that the past two weeks she has been binge drinking due to  feeling depressed.    Patient reports that her PCP prescribes her medication for depression and feels it is effective. Patient is aware of dangers of taking antidepressant medications with alcohol. Patient reports that she does not feel SI or HI and is aware that she needs more positive coping skills. Patient reports that she feels guilty about drinking and often blacks out. Due to patient's high SBIRT score, CSW suggested that SA resource options be discussed.    Patient reports she is not feeling well and would like to follow up on options at later time. CSW agreeable to come back to finish assessment.   Assessment/plan status:  Referral to [employer] Other assessment/ plan:   SBIRT   Information/referral to community resources:   Patient agreeable to resources at later time    PATIENT'S/FAMILY'S RESPONSE TO PLAN OF CARE: Patient alert and oriented. Patient very open to discuss substance use but became guarded when time to discuss rehab options. Patient does report strained relationship with husband and children. Although husband enables her drinking, husband has been sober for the past 20 years. Patient's children do not like her drinking and are upset with father for bringing alcohol. Patient does well identifying triggers but appears to be resistant to rehab options. Patient thanked CSW for checking on her and is agreeable to follow up at later time.       [place], [place] [phone]

## 2013-09-24 DIAGNOSIS — N390 Urinary tract infection, site not specified: Secondary | ICD-10-CM | POA: Diagnosis not present

## 2013-09-24 DIAGNOSIS — G40909 Epilepsy, unspecified, not intractable, without status epilepticus: Secondary | ICD-10-CM | POA: Diagnosis present

## 2013-09-24 DIAGNOSIS — R569 Unspecified convulsions: Secondary | ICD-10-CM | POA: Diagnosis present

## 2013-09-24 LAB — URINE CULTURE

## 2013-09-24 LAB — MAGNESIUM: Magnesium: 1.7 mg/dL (ref 1.5–2.5)

## 2013-09-24 MED ORDER — ENSURE PUDDING PO PUDG: 1.0000 | Freq: Three times a day (TID) | ORAL | Status: DC

## 2013-09-24 MED ORDER — FOLIC ACID 1 MG PO TABS: 1.0000 mg | ORAL_TABLET | Freq: Every day | ORAL | Status: DC

## 2013-09-24 MED ORDER — CIPROFLOXACIN HCL 500 MG PO TABS: 500.0000 mg | ORAL_TABLET | Freq: Two times a day (BID) | ORAL | Status: DC

## 2013-09-24 MED ORDER — ADULT MULTIVITAMIN W/MINERALS CH: 1.0000 | ORAL_TABLET | Freq: Every day | ORAL | Status: DC

## 2013-09-24 MED ORDER — LORAZEPAM 0.5 MG PO TABS: 0.5000 mg | ORAL_TABLET | Freq: Three times a day (TID) | ORAL | Status: DC | PRN

## 2013-09-24 MED ORDER — THIAMINE HCL 100 MG PO TABS: 100.0000 mg | ORAL_TABLET | Freq: Every day | ORAL | Status: DC

## 2013-09-24 NOTE — Progress Notes (Signed)
Clinical Social Work  CSW met with patient at bedside and patient agreeable to discuss SA options with patient. Patient reports that she has thought about options and wants to DC home. Patient reports that husband has been sober for 27 years by attending [place] meetings. Patient believes if she "works the program" by attending 90 meetings in 90 days that she will be successful at staying sober. Patient reports that she has had a sponsor in the past and feels that it was helpful. CSW explained that different programs can work for different people and encouraged patient to reconsider options. CSW explained inpatient options, intensive outpatient, and outpatient resources. Patient reports she wants to go home and attend AA meetings. Patient has transportation and feels this will be the best program. Patient politely declines to accept referral flyer with treatment options listed and stated she already knows which AA meetings she will attend. Patient withdrawn and disengaged when discussing options. Patient is fixated on AA meetings and does not feel other treatment is needed. Patient agreeable to [facility] services and CM notified. CSW is signing off but available if further needs arise.  [place], [place] [phone]

## 2013-09-24 NOTE — Discharge Instructions (Signed)
Alcohol Problems °Most adults who drink alcohol drink in moderation (not a lot) are at low risk for developing problems related to their drinking. However, all drinkers, including low-risk drinkers, should know about the health risks connected with drinking alcohol. °RECOMMENDATIONS FOR LOW-RISK DRINKING  °Drink in moderation. Moderate drinking is defined as follows:  °· Men - no more than 2 drinks per day. °· Nonpregnant women - no more than 1 drink per day. °· Over age 65 - no more than 1 drink per day. °A standard drink is 12 grams of pure alcohol, which is equal to a 12 ounce bottle of beer or wine cooler, a 5 ounce glass of wine, or 1.5 ounces of distilled spirits (such as whiskey, brandy, vodka, or rum).  °ABSTAIN FROM (DO NOT DRINK) ALCOHOL: °· When pregnant or considering pregnancy. °· When taking a medication that interacts with alcohol. °· If you are alcohol dependent. °· A medical condition that prohibits drinking alcohol (such as ulcer, liver disease, or heart disease). °DISCUSS WITH YOUR CAREGIVER: °· If you are at risk for coronary heart disease, discuss the potential benefits and risks of alcohol use: Light to moderate drinking is associated with lower rates of coronary heart disease in certain populations (for example, men over age 45 and postmenopausal women). Infrequent or nondrinkers are advised not to begin light to moderate drinking to reduce the risk of coronary heart disease so as to avoid creating an alcohol-related problem. Similar protective effects can likely be gained through proper diet and exercise. °· Women and the elderly have smaller amounts of body water than men. As a result women and the elderly achieve a higher blood alcohol concentration after drinking the same amount of alcohol. °· Exposing a fetus to alcohol can cause a broad range of birth defects referred to as Fetal Alcohol Syndrome (FAS) or Alcohol-Related Birth Defects (ARBD). Although FAS/ARBD is connected with excessive  alcohol consumption during pregnancy, studies also have reported neurobehavioral problems in infants born to mothers reporting drinking an average of 1 drink per day during pregnancy. °· Heavier drinking (the consumption of more than 4 drinks per occasion by men and more than 3 drinks per occasion by women) impairs learning (cognitive) and psychomotor functions and increases the risk of alcohol-related problems, including accidents and injuries. °CAGE QUESTIONS:  °· Have you ever felt that you should Cut down on your drinking? °· Have people Annoyed you by criticizing your drinking? °· Have you ever felt bad or Guilty about your drinking? °· Have you ever had a drink first thing in the morning to steady your nerves or get rid of a hangover (Eye opener)? °If you answered positively to any of these questions: You may be at risk for alcohol-related problems if alcohol consumption is:  °· Men: Greater than 14 drinks per week or more than 4 drinks per occasion. °· Women: Greater than 7 drinks per week or more than 3 drinks per occasion. °Do you or your family have a medical history of alcohol-related problems, such as: °· Blackouts. °· Sexual dysfunction. °· Depression. °· Trauma. °· Liver dysfunction. °· Sleep disorders. °· Hypertension. °· Chronic abdominal pain. °· Has your drinking ever caused you problems, such as problems with your family, problems with your work (or school) performance, or accidents/injuries? °· Do you have a compulsion to drink or a preoccupation with drinking? °· Do you have poor control or are you unable to stop drinking once you have started? °· Do you have to drink to   avoid withdrawal symptoms? °· Do you have problems with withdrawal such as tremors, nausea, sweats, or mood disturbances? °· Does it take more alcohol than in the past to get you high? °· Do you feel a strong urge to drink? °· Do you change your plans so that you can have a drink? °· Do you ever drink in the morning to relieve  the shakes or a hangover? °If you have answered a number of the previous questions positively, it may be time for you to talk to your caregivers, family, and friends and see if they think you have a problem. Alcoholism is a chemical dependency that keeps getting worse and will eventually destroy your health and relationships. Many alcoholics end up dead, impoverished, or in prison. This is often the end result of all chemical dependency. °· Do not be discouraged if you are not ready to take action immediately. °· Decisions to change behavior often involve up and down desires to change and feeling like you cannot decide. °· Try to think more seriously about your drinking behavior. °· Think of the reasons to quit. °WHERE TO GO FOR ADDITIONAL INFORMATION  °· The National Institute on Alcohol Abuse and Alcoholism (NIAAA) °www.niaaa.nih.gov °· National Council on Alcoholism and Drug Dependence (NCADD) °www.ncadd.org °· American Society of Addiction Medicine (ASAM) °www.asam.org  °Document Released: 06/20/2005 Document Revised: 09/12/2011 Document Reviewed: 02/06/2008 °ExitCare® Patient Information ©2014 [employer]. ° °

## 2013-09-24 NOTE — Care Management Note (Unsigned)
    Page 1 of 1   09/24/2013     2:51:57 PM   CARE MANAGEMENT NOTE 09/24/2013  Patient:  Cherylann RatelXXXMOORE,Sabrinna S   Account Number:  [id number]  Date Initiated:  09/24/2013  Documentation initiated by:  [facility] LL[facility]IRINGU,Tram Wrenn  Subjective/Objective Assessment:   63 year old female admitted with alcohol related seizure.     Action/Plan:   HH services at d/c.   Anticipated DC Date:  09/24/2013   Anticipated DC Plan:  HOME W HOME HEALTH SERVICES      DC Planning Services  CM consult      Choice offered to / List presented to:  C-1 Patient        HH arranged  HH-3 OT  HH-2 PT  HH-1 RN      [facility]   Status of service:  In process, will continue to follow Medicare Important Message given?  NA - LOS <3 / Initial given by admissions (If response is "NO", the following Medicare IM given date fields will be blank) Date Medicare IM given:   Date Additional Medicare IM given:    Discharge Disposition:    Per UR Regulation:  Reviewed for med. necessity/level of care/duration of stay  If discussed at Long Length of Stay Meetings, dates discussed:    Comments:

## 2013-09-24 NOTE — Discharge Summary (Addendum)
Physician Discharge Summary  Ashley Bradley [id number] DOB: March 23, 1951 DOA: 09/20/2013  PCP: Ashley LabellaMILLER,Ashley LYNN, MD  Admit date: 09/20/2013 Discharge date: 09/24/2013  Time spent: 40 minutes  Recommendations for Outpatient Follow-up:  1. Home with HHPT 2. Follow up with PCP in one week. Please follow urine culture and sensitivity from 3/23.  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   Hospital Course:  Hyponatremia  secondary to alcoholism . euvolemic and  improving; appreciate nephrology input . Recommend limiting free water and increase solute intake in food. Lasix discontinued.  Seizures  Likely etoh induced given previous hx of etoh seizures and active alcoholism.  EEG negative for seizure activity.  D/c keppra started this admission as seizures appear clearly related to etoh use.   [name]. Alcoholism  No signs of withdrawal or DTs. . Continue thiamine, foalte and MV  Counseled on cessation. Refused help from SW . Will discharge on low dose prn ativan for 3-4 days for anxiety and withdrawal.  HTN  BP stable   Diarrhea on 3/23 likely viral gastroenteritis. Reports her husband to be sick with diarrhea and vomiting as well. Resolved this am.   UTI Will discharge on empiric ciprofloxacin for 3 days. Follow urine cx as outpt  Malnutrition Added nutritional supplements  Patient seen by physical therapy and  recommended home health PT with supervision and 24 hour assistance. We will arrange for home health PT.  Code Status: full  Family Communication: none at bedside  Disposition Plan:home with outpatient PCP followup  Consultants:  Renal   Procedures:  None   Antibiotics:  none   Discharge Exam: Filed Vitals:   09/24/13 0508  BP:   Pulse:   Temp: 98.2 F (36.8 C)  Resp:     General: Elderly female in NAD  HEENT: no pallor, moist mucosa  chest: clear b/l, no added sounds  CVS: NS1&S2, no murmurs  Abd: soft, NT, ND, BS+  Ext: Warm, no edema  CNS: AAOX3, non focal  Discharge Instructions     Medication List         ciprofloxacin 500 MG tablet  Commonly known as:  CIPRO  Take 1 tablet (500 mg total) by mouth 2 (two) times daily.UNTIL 3/37      feeding supplement (ENSURE) Pudg  Take 1 Container by mouth 3 (three) times daily between meals.     FLUoxetine 40 MG capsule  Commonly known as:  PROZAC  Take 1 capsule (40 mg total) by mouth daily. For depression.     folic acid 1 MG tablet  Commonly known as:  FOLVITE  Take 1 tablet (1 mg total) by mouth daily.     LORazepam 0.5 MG tablet  Commonly known as:  ATIVAN  Take 1 tablet (0.5 mg total) by mouth every 8 (eight) hours as needed for anxiety.     losartan 50 MG tablet  Commonly known  as:  COZAAR  Take 1 tablet (50 mg total) by mouth daily. For hypertension.     multivitamin with minerals Tabs tablet  Take 1 tablet by mouth daily.     thiamine 100 MG tablet  Take 1 tablet (100 mg total) by mouth daily.       Allergies  Allergen Reactions  . Macrodantin [Nitrofurantoin] Nausea And Vomiting       Follow-up Information   Follow up with Ashley Labella, MD In 1 week.   Specialty:  Family Medicine   Contact information:   [address] [place] [address] [phone]        The results of significant diagnostics from this hospitalization (including imaging, microbiology, ancillary  and laboratory) are listed below for reference.    Significant Diagnostic Studies: Dg Chest 2 View  09/20/2013   CLINICAL DATA:  Alcohol abuse.  Seizure.  EXAM: CHEST  2 VIEW  COMPARISON:  01/31/2010  FINDINGS: Patchy bilateral lower lobe airspace opacities are noted, new since prior study. This could represent atelectasis or infiltrates/pneumonia. No effusions. Heart is normal size. No acute bony abnormality.  IMPRESSION: Bibasilar atelectasis or infiltrates.   Electronically Signed   By: Charlett Nose M.D.   On: 09/20/2013 14:17   Ct Head Wo Contrast  09/20/2013   CLINICAL DATA:  Seizure today, generalized weakness, frontal headache  EXAM: CT HEAD WITHOUT CONTRAST  TECHNIQUE: Contiguous axial images were obtained from the base of the skull through the vertex without intravenous contrast.  COMPARISON:  CT HEAD W/O CM dated 01/31/2010  FINDINGS: Similar findings of mild atrophy with mild prominence of the bifrontal extra-axial spaces. Unchanged bilateral basal ganglial calcifications. The gray-white differentiation is otherwise well maintained without CT evidence of acute large territory infarct. No intraparenchymal extra-axial mass or hemorrhage. Unchanged size and configuration of the ventricles and basilar cisterns. No midline shift. Limited visualization of the paranasal sinuses and mastoid air cells are normal. Regional soft tissues are normal. No displaced calvarial fracture.  IMPRESSION: Mild atrophy without acute intracranial process.   Electronically Signed   By: Simonne Come M.D.   On: 09/20/2013 14:28    Microbiology: Recent Results (from the past 240 hour(s))  URINE CULTURE     Status: None   Collection Time    09/20/13  3:15 PM      Result Value Ref Range Status   Specimen Description URINE, CLEAN CATCH   Final   Special Requests NONE   Final   Culture  Setup Time     Final   Value: 09/20/2013 21:26     Performed at [facility] Count     Final   Value: 35,000  COLONIES/ML     Performed at [facility]   Culture     Final   Value: Multiple bacterial morphotypes present, none predominant. Suggest appropriate recollection if clinically indicated.     Performed at [facility]   Report Status 09/21/2013 FINAL   Final  MRSA PCR SCREENING     Status: None   Collection Time    09/20/13  4:29 PM      Result Value Ref Range Status   MRSA by PCR NEGATIVE  NEGATIVE Final   Comment:            The GeneXpert MRSA Assay (FDA     approved for NASAL specimens     only), is one component of a     comprehensive MRSA colonization  surveillance program. It is not     intended to diagnose MRSA     infection nor to guide or     monitor treatment for     MRSA infections.  URINE CULTURE     Status: None   Collection Time    09/23/13  7:30 AM      Result Value Ref Range Status   Specimen Description URINE, CLEAN CATCH   Final   Special Requests NONE   Final   Culture  Setup Time     Final   Value: 09/23/2013 14:44     Performed at [facility] Count     Final   Value: 85,000 COLONIES/ML     Performed at [facility]   Culture     Final   Value: Multiple bacterial morphotypes present, none predominant. Suggest appropriate recollection if clinically indicated.     Performed at [facility]   Report Status 09/24/2013 FINAL   Final     Labs: Basic Metabolic Panel:  Recent Labs Lab 09/20/13 2356 09/21/13 0322 09/21/13 0806 09/22/13 0526 09/23/13 0500 09/24/13 0438  NA 124* 127* 127* 131* 135*  --   K 3.9 3.8 3.7 3.3* 4.1  --   CL 83* 86* 87* 90* 97  --   CO2 25 26 26 30 25   --   GLUCOSE 90 85 85 98 144*  --   BUN 15 14 12 10 8   --   CREATININE 0.50 0.50 0.48* 0.49* 0.46*  --   CALCIUM 8.1* 8.2* 8.3* 8.4 8.8  --   MG  --  2.8*  --  2.0 1.9 1.7   Liver Function Tests:  Recent Labs Lab 09/20/13 1300  AST 172*  ALT 93*  ALKPHOS 191*  BILITOT 0.8  PROT 7.2  ALBUMIN 3.7    Recent  Labs Lab 09/20/13 1300  LIPASE 61*   No results found for this basename: AMMONIA,  in the last 168 hours CBC:  Recent Labs Lab 09/20/13 1300 09/21/13 0322  WBC 5.3 4.4  NEUTROABS 4.3  --   HGB 12.0 10.6*  HCT 34.6* 31.3*  MCV 86.9 89.9  PLT 167 143*   Cardiac Enzymes: No results found for this basename: CKTOTAL, CKMB, CKMBINDEX, TROPONINI,  in the last 168 hours BNP: BNP (last 3 results) No results found for this basename: PROBNP,  in the last 8760 hours CBG: No results found for this basename: GLUCAP,  in the last 168 hours     Signed:  Eddie North  [facility] 09/24/2013, 11:44 AM

## 2016-01-16 ENCOUNTER — Inpatient Hospital Stay (HOSPITAL_COMMUNITY)
Admission: EM | Admit: 2016-01-16 | Discharge: 2016-01-23 | DRG: 897 | Disposition: A | Discharge status: Skilled Nursing Facility | Payer: Medicare Other | Attending: Internal Medicine | Admitting: Internal Medicine

## 2016-01-16 ENCOUNTER — Encounter (HOSPITAL_COMMUNITY): Payer: Self-pay

## 2016-01-16 ENCOUNTER — Emergency Department (HOSPITAL_COMMUNITY): Payer: Medicare Other

## 2016-01-16 DIAGNOSIS — A047 Enterocolitis due to Clostridium difficile: Secondary | ICD-10-CM | POA: Diagnosis present

## 2016-01-16 DIAGNOSIS — E871 Hypo-osmolality and hyponatremia: Secondary | ICD-10-CM | POA: Diagnosis present

## 2016-01-16 DIAGNOSIS — G809 Cerebral palsy, unspecified: Secondary | ICD-10-CM | POA: Diagnosis present

## 2016-01-16 DIAGNOSIS — E860 Dehydration: Secondary | ICD-10-CM | POA: Diagnosis present

## 2016-01-16 DIAGNOSIS — M549 Dorsalgia, unspecified: Secondary | ICD-10-CM | POA: Diagnosis present

## 2016-01-16 DIAGNOSIS — F329 Major depressive disorder, single episode, unspecified: Secondary | ICD-10-CM | POA: Diagnosis present

## 2016-01-16 DIAGNOSIS — R52 Pain, unspecified: Secondary | ICD-10-CM

## 2016-01-16 DIAGNOSIS — Z881 Allergy status to other antibiotic agents status: Secondary | ICD-10-CM

## 2016-01-16 DIAGNOSIS — G8929 Other chronic pain: Secondary | ICD-10-CM | POA: Diagnosis present

## 2016-01-16 DIAGNOSIS — R109 Unspecified abdominal pain: Secondary | ICD-10-CM

## 2016-01-16 DIAGNOSIS — G4700 Insomnia, unspecified: Secondary | ICD-10-CM | POA: Diagnosis present

## 2016-01-16 DIAGNOSIS — R112 Nausea with vomiting, unspecified: Secondary | ICD-10-CM | POA: Diagnosis not present

## 2016-01-16 DIAGNOSIS — R197 Diarrhea, unspecified: Secondary | ICD-10-CM

## 2016-01-16 DIAGNOSIS — Z79899 Other long term (current) drug therapy: Secondary | ICD-10-CM | POA: Diagnosis not present

## 2016-01-16 DIAGNOSIS — E876 Hypokalemia: Secondary | ICD-10-CM | POA: Diagnosis not present

## 2016-01-16 DIAGNOSIS — F10239 Alcohol dependence with withdrawal, unspecified: Secondary | ICD-10-CM | POA: Diagnosis not present

## 2016-01-16 DIAGNOSIS — I959 Hypotension, unspecified: Secondary | ICD-10-CM | POA: Diagnosis present

## 2016-01-16 DIAGNOSIS — E872 Acidosis: Secondary | ICD-10-CM | POA: Diagnosis present

## 2016-01-16 DIAGNOSIS — Y908 Blood alcohol level of 240 mg/100 ml or more: Secondary | ICD-10-CM | POA: Diagnosis present

## 2016-01-16 DIAGNOSIS — R748 Abnormal levels of other serum enzymes: Secondary | ICD-10-CM | POA: Diagnosis not present

## 2016-01-16 DIAGNOSIS — F10939 Alcohol use, unspecified with withdrawal, unspecified: Secondary | ICD-10-CM | POA: Diagnosis present

## 2016-01-16 DIAGNOSIS — I10 Essential (primary) hypertension: Secondary | ICD-10-CM | POA: Diagnosis present

## 2016-01-16 DIAGNOSIS — I4581 Long QT syndrome: Secondary | ICD-10-CM | POA: Diagnosis present

## 2016-01-16 DIAGNOSIS — R000 Tachycardia, unspecified: Secondary | ICD-10-CM | POA: Diagnosis present

## 2016-01-16 DIAGNOSIS — N179 Acute kidney failure, unspecified: Secondary | ICD-10-CM | POA: Diagnosis present

## 2016-01-16 DIAGNOSIS — F419 Anxiety disorder, unspecified: Secondary | ICD-10-CM | POA: Diagnosis present

## 2016-01-16 DIAGNOSIS — R601 Generalized edema: Secondary | ICD-10-CM | POA: Diagnosis not present

## 2016-01-16 DIAGNOSIS — R0602 Shortness of breath: Secondary | ICD-10-CM

## 2016-01-16 DIAGNOSIS — K760 Fatty (change of) liver, not elsewhere classified: Secondary | ICD-10-CM | POA: Diagnosis present

## 2016-01-16 DIAGNOSIS — R0902 Hypoxemia: Secondary | ICD-10-CM | POA: Diagnosis present

## 2016-01-16 DIAGNOSIS — E8809 Other disorders of plasma-protein metabolism, not elsewhere classified: Secondary | ICD-10-CM | POA: Diagnosis present

## 2016-01-16 DIAGNOSIS — F1010 Alcohol abuse, uncomplicated: Secondary | ICD-10-CM | POA: Diagnosis not present

## 2016-01-16 DIAGNOSIS — R7989 Other specified abnormal findings of blood chemistry: Secondary | ICD-10-CM

## 2016-01-16 DIAGNOSIS — F1021 Alcohol dependence, in remission: Secondary | ICD-10-CM | POA: Diagnosis not present

## 2016-01-16 DIAGNOSIS — I248 Other forms of acute ischemic heart disease: Secondary | ICD-10-CM | POA: Diagnosis present

## 2016-01-16 LAB — COMPREHENSIVE METABOLIC PANEL
ALK PHOS: 152 U/L — AB (ref 38–126)
ALT: 93 U/L — AB (ref 14–54)
AST: 137 U/L — ABNORMAL HIGH (ref 15–41)
Albumin: 3.6 g/dL (ref 3.5–5.0)
BUN: 55 mg/dL — ABNORMAL HIGH (ref 6–20)
CO2: 11 mmol/L — ABNORMAL LOW (ref 22–32)
Calcium: 7.2 mg/dL — ABNORMAL LOW (ref 8.9–10.3)
Chloride: 94 mmol/L — ABNORMAL LOW (ref 101–111)
Creatinine, Ser: 2.4 mg/dL — ABNORMAL HIGH (ref 0.44–1.00)
GFR, EST AFRICAN AMERICAN: 23 mL/min — AB (ref >60)
GFR, EST NON AFRICAN AMERICAN: 20 mL/min — AB (ref >60)
GLUCOSE: 65 mg/dL (ref 65–99)
POTASSIUM: 4.2 mmol/L (ref 3.5–5.1)
SODIUM: 131 mmol/L — AB (ref 135–145)
TOTAL PROTEIN: 6.5 g/dL (ref 6.5–8.1)
Total Bilirubin: 1.6 mg/dL — ABNORMAL HIGH (ref 0.3–1.2)

## 2016-01-16 LAB — CBC WITH DIFFERENTIAL/PLATELET
BASOS ABS: 0 10*3/uL (ref 0.0–0.1)
BASOS PCT: 0 %
Eosinophils Absolute: 0 10*3/uL (ref 0.0–0.7)
Eosinophils Relative: 0 %
HEMATOCRIT: 35.1 % — AB (ref 36.0–46.0)
Hemoglobin: 11.8 g/dL — ABNORMAL LOW (ref 12.0–15.0)
LYMPHS ABS: 0.5 10*3/uL — AB (ref 0.7–4.0)
Lymphocytes Relative: 5 %
MCH: 31.1 pg (ref 26.0–34.0)
MCHC: 33.6 g/dL (ref 30.0–36.0)
MCV: 92.6 fL (ref 78.0–100.0)
Monocytes Absolute: 1.6 10*3/uL — ABNORMAL HIGH (ref 0.1–1.0)
Monocytes Relative: 16 %
NEUTROS ABS: 8.2 10*3/uL — AB (ref 1.7–7.7)
Neutrophils Relative %: 79 %
Platelets: 190 10*3/uL (ref 150–400)
RBC: 3.79 MIL/uL — ABNORMAL LOW (ref 3.87–5.11)
RDW: 15.3 % (ref 11.5–15.5)
WBC: 10.3 10*3/uL (ref 4.0–10.5)

## 2016-01-16 LAB — URINALYSIS, ROUTINE W REFLEX MICROSCOPIC
Glucose, UA: NEGATIVE mg/dL
Ketones, ur: 15 mg/dL — AB
LEUKOCYTES UA: NEGATIVE
Nitrite: NEGATIVE
PROTEIN: 30 mg/dL — AB
Specific Gravity, Urine: 1.017 (ref 1.005–1.030)
pH: 5 (ref 5.0–8.0)

## 2016-01-16 LAB — RAPID URINE DRUG SCREEN, HOSP PERFORMED
AMPHETAMINES: NOT DETECTED
Barbiturates: NOT DETECTED
Benzodiazepines: NOT DETECTED
Cocaine: NOT DETECTED
OPIATES: NOT DETECTED
Tetrahydrocannabinol: NOT DETECTED

## 2016-01-16 LAB — PROTIME-INR
INR: 1.11 (ref 0.00–1.49)
INR: 1.16 (ref 0.00–1.49)
PROTHROMBIN TIME: 14.1 s (ref 11.6–15.2)
PROTHROMBIN TIME: 14.5 s (ref 11.6–15.2)

## 2016-01-16 LAB — ETHANOL: Alcohol, Ethyl (B): 269 mg/dL — ABNORMAL HIGH (ref <5)

## 2016-01-16 LAB — I-STAT CG4 LACTIC ACID, ED: LACTIC ACID, VENOUS: 2.43 mmol/L — AB (ref 0.5–1.9)

## 2016-01-16 LAB — BASIC METABOLIC PANEL
BUN: 51 mg/dL — AB (ref 6–20)
CHLORIDE: 103 mmol/L (ref 101–111)
CO2: 10 mmol/L — ABNORMAL LOW (ref 22–32)
Calcium: 6.7 mg/dL — ABNORMAL LOW (ref 8.9–10.3)
Creatinine, Ser: 2.19 mg/dL — ABNORMAL HIGH (ref 0.44–1.00)
GFR calc Af Amer: 26 mL/min — ABNORMAL LOW (ref >60)
GFR calc non Af Amer: 22 mL/min — ABNORMAL LOW (ref >60)
Glucose, Bld: 72 mg/dL (ref 65–99)
POTASSIUM: 4.3 mmol/L (ref 3.5–5.1)
SODIUM: 134 mmol/L — AB (ref 135–145)

## 2016-01-16 LAB — MAGNESIUM: Magnesium: 2 mg/dL (ref 1.7–2.4)

## 2016-01-16 LAB — URINE MICROSCOPIC-ADD ON

## 2016-01-16 LAB — APTT: aPTT: 35 seconds (ref 24–37)

## 2016-01-16 LAB — LACTIC ACID, PLASMA
LACTIC ACID, VENOUS: 1.1 mmol/L (ref 0.5–1.9)
Lactic Acid, Venous: 1.5 mmol/L (ref 0.5–1.9)

## 2016-01-16 LAB — FOLATE: FOLATE: 7.2 ng/mL (ref >5.9)

## 2016-01-16 LAB — MRSA PCR SCREENING: MRSA BY PCR: NEGATIVE

## 2016-01-16 LAB — AMMONIA: AMMONIA: 25 umol/L (ref 9–35)

## 2016-01-16 LAB — TROPONIN I: Troponin I: 0.07 ng/mL (ref <0.03)

## 2016-01-16 LAB — VITAMIN B12: VITAMIN B 12: 702 pg/mL (ref 180–914)

## 2016-01-16 LAB — LIPASE, BLOOD: Lipase: 144 U/L — ABNORMAL HIGH (ref 11–51)

## 2016-01-16 MED ORDER — MAGNESIUM SULFATE 2 GM/50ML IV SOLN
2.0000 g | Freq: Once | INTRAVENOUS | Status: AC
  Administered 2016-01-16: 2 g via INTRAVENOUS
  Filled 2016-01-16: qty 50

## 2016-01-16 MED ORDER — HYDROCORTISONE NA SUCCINATE PF 100 MG IJ SOLR
100.0000 mg | Freq: Three times a day (TID) | INTRAMUSCULAR | Status: DC
  Administered 2016-01-16 – 2016-01-19 (×9): 100 mg via INTRAVENOUS
  Filled 2016-01-16 (×9): qty 2

## 2016-01-16 MED ORDER — SODIUM CHLORIDE 0.9 % IV BOLUS (SEPSIS)
1000.0000 mL | Freq: Once | INTRAVENOUS | Status: AC
  Administered 2016-01-16: 1000 mL via INTRAVENOUS

## 2016-01-16 MED ORDER — LORAZEPAM 2 MG/ML IJ SOLN
2.0000 mg | INTRAMUSCULAR | Status: DC | PRN
  Administered 2016-01-16 – 2016-01-23 (×18): 2 mg via INTRAVENOUS
  Filled 2016-01-16 (×19): qty 1

## 2016-01-16 MED ORDER — ONDANSETRON HCL 4 MG/2ML IJ SOLN
4.0000 mg | Freq: Four times a day (QID) | INTRAMUSCULAR | Status: DC | PRN
  Administered 2016-01-18: 4 mg via INTRAVENOUS
  Filled 2016-01-16: qty 2

## 2016-01-16 MED ORDER — SODIUM CHLORIDE 0.9 % IV BOLUS (SEPSIS)
3000.0000 mL | Freq: Once | INTRAVENOUS | Status: AC
  Administered 2016-01-16: 3000 mL via INTRAVENOUS

## 2016-01-16 MED ORDER — SODIUM BICARBONATE 8.4 % IV SOLN
50.0000 meq | Freq: Once | INTRAVENOUS | Status: AC
  Administered 2016-01-16: 50 meq via INTRAVENOUS
  Filled 2016-01-16: qty 50

## 2016-01-16 MED ORDER — ONDANSETRON HCL 4 MG/2ML IJ SOLN
4.0000 mg | Freq: Once | INTRAMUSCULAR | Status: AC
  Administered 2016-01-16: 4 mg via INTRAVENOUS
  Filled 2016-01-16: qty 2

## 2016-01-16 MED ORDER — SODIUM CHLORIDE 0.9% FLUSH
3.0000 mL | Freq: Two times a day (BID) | INTRAVENOUS | Status: DC
  Administered 2016-01-17 – 2016-01-23 (×9): 3 mL via INTRAVENOUS

## 2016-01-16 MED ORDER — LORAZEPAM 2 MG/ML IJ SOLN
1.0000 mg | Freq: Once | INTRAMUSCULAR | Status: AC
  Administered 2016-01-16: 1 mg via INTRAVENOUS
  Filled 2016-01-16: qty 1

## 2016-01-16 MED ORDER — DEXTROSE IN LACTATED RINGERS 5 % IV SOLN
INTRAVENOUS | Status: DC
  Administered 2016-01-16 – 2016-01-21 (×10): via INTRAVENOUS

## 2016-01-16 MED ORDER — SODIUM CHLORIDE 0.9 % IV SOLN
INTRAVENOUS | Status: DC
  Administered 2016-01-16: 13:00:00 via INTRAVENOUS

## 2016-01-16 MED ORDER — THIAMINE HCL 100 MG/ML IJ SOLN
Freq: Once | INTRAVENOUS | Status: AC
  Administered 2016-01-16: 13:00:00 via INTRAVENOUS
  Filled 2016-01-16: qty 1000

## 2016-01-16 MED ORDER — PIPERACILLIN-TAZOBACTAM 3.375 G IVPB
3.3750 g | Freq: Three times a day (TID) | INTRAVENOUS | Status: DC
  Administered 2016-01-16 – 2016-01-18 (×6): 3.375 g via INTRAVENOUS
  Filled 2016-01-16 (×6): qty 50

## 2016-01-16 MED ORDER — ENOXAPARIN SODIUM 30 MG/0.3ML ~~LOC~~ SOLN
30.0000 mg | SUBCUTANEOUS | Status: DC
  Administered 2016-01-16 – 2016-01-17 (×2): 30 mg via SUBCUTANEOUS
  Filled 2016-01-16 (×2): qty 0.3

## 2016-01-16 NOTE — ED Notes (Signed)
Patient transported to CT 

## 2016-01-16 NOTE — Progress Notes (Signed)
Pharmacy Antibiotic Note  Ashley Bradley is a 65 y.o. female admitted to the ED on 01/16/2016 for excessive drinking, n/v, abd pain and poor oral intake.  Abd CT showed findings with concern for infectious colitis.  To start zosyn for intra-abdominal infection.    7/15 adb CT: fatty infiltration of the liver; wall thickening of cecum concerning for inflammation or infectious colitis - afeb, wbc 10.3, scr down 2.19 (crcl~30), LA 2.43   Plan: - zosyn 3.375 gm IV q8h (inuse over 4 hours)  __________________________  Temp (24hrs), Avg:98 F (36.7 C), Min:97.8 F (36.6 C), Max:98.1 F (36.7 C)   Recent Labs Lab 01/16/16 1015 01/16/16 1027 01/16/16 1037 01/16/16 1303  WBC  --  10.3  --   --   CREATININE 2.40*  --   --  2.19*  LATICACIDVEN  --   --  2.43* 1.5    CrCl cannot be calculated (Unknown ideal weight.).    Allergies  Allergen Reactions  . Macrodantin [Nitrofurantoin] Nausea And Vomiting     Thank you for allowing pharmacy to be a part of this patient's care.  Lucia Gaskinsham, Chyler Creely P 01/16/2016 2:26 PM

## 2016-01-16 NOTE — ED Notes (Signed)
Patient attempted to void, but was unsuccessful.  

## 2016-01-16 NOTE — ED Notes (Signed)
Bed: WA15 Expected date:  Expected time:  Means of arrival:  Comments: 

## 2016-01-16 NOTE — H&P (Signed)
History and Physical  Ashley BowensDarlene S Ikeda ZOX:[id number] DOB: [birth year] DOA: 01/16/2016  Referring physician: EDP PCP: Neldon LabellaMILLER,[name], MD   Chief Complaint: alcohol withdrawal  HPI: Ashley Bradley is a 65 y.o. female   who [facility] due to alcohol abuse, her daughter IVC'd patient due to excessive drinking alcohol,per daughter, patient is not eating, has n/v/d, abdominal pain, patient has not been out of bed for the last two weeks and she is found lying  urine and feces. Her last drink was this morning, she lives with her husband. Patient currently with slight confusion and impaired memory, not able to provide reliable history.  ED course, upon arrival she is found hypotensive sbp in the 70's sinus tachycardia in 110's, she was initially hypoxia reported due to not taking deep breath, she denies cough, does have n/v/d/ab pain, no fever, she is shaky with slight confusion, labs no leukocytosis, na 131, bun/cr 55/2.4, lipase 144,tbili 1.6,ast 137, alt 93, alkphos 152, lactic acid 2.4, EKG with sinus tachycardia, QTC 500, she received ivf, antiemetic, ua pending collection, hospitalist called to admit the patient to stepdown.  i have requested CT ab and chest imaging to be done in the ED.    Review of Systems:  Detail per HPI, Review of systems are otherwise negative  Past Medical History  Diagnosis Date  . Hypertension   . Alcoholism (HCC)   . Depression   . CP (cerebral palsy) (HCC)   . Seizures (HCC)     ETOH induced  . Complication of anesthesia     hard time waking up  . PONV (postoperative nausea and vomiting)    Past Surgical History  Procedure Laterality Date  . Breast surgery    . Tubal ligation    . Bladder surgery     Social History:  reports that she has never smoked. She has never used smokeless tobacco. She reports that she drinks alcohol. She reports that she does not use illicit drugs. [facility] [place] has not been out of bed for the last two  weeks  Allergies  Allergen Reactions  . Macrodantin [Nitrofurantoin] Nausea And Vomiting    No family history on file.    Prior to Admission medications   Medication Sig Start Date End Date Taking? Authorizing Provider  ciprofloxacin (CIPRO) 500 MG tablet Take 1 tablet (500 mg total) by mouth 2 (two) times daily. 09/24/13   Nishant Dhungel, MD  feeding supplement, ENSURE, (ENSURE) PUDG Take 1 Container by mouth 3 (three) times daily between meals. 09/24/13   Nishant Dhungel, MD  FLUoxetine (PROZAC) 40 MG capsule Take 1 capsule (40 mg total) by mouth daily. For depression. 03/19/13   Tamala JulianNeil T Mashburn, PA-C  folic acid (FOLVITE) 1 MG tablet Take 1 tablet (1 mg total) by mouth daily. 09/24/13   Nishant Dhungel, MD  LORazepam (ATIVAN) 0.5 MG tablet Take 1 tablet (0.5 mg total) by mouth every 8 (eight) hours as needed for anxiety. 09/24/13   Nishant Dhungel, MD  losartan (COZAAR) 50 MG tablet Take 1 tablet (50 mg total) by mouth daily. For hypertension. 03/19/13   Tamala JulianNeil T Mashburn, PA-C  Multiple Vitamin (MULTIVITAMIN WITH MINERALS) TABS tablet Take 1 tablet by mouth daily. 09/24/13   Nishant Dhungel, MD  thiamine 100 MG tablet Take 1 tablet (100 mg total) by mouth daily. 09/24/13   Nishant Dhungel, MD    Physical Exam: BP 92/53 mmHg  Pulse 103  Temp(Src) 98.1 F (36.7 C) (  Oral)  Resp 16  SpO2 100%  General:  Acutely ill appearance, In moderate distress, Shaky, slight confusion Eyes: PERRL ENT: dry oral mucosa Neck: supple, no JVD Cardiovascular: tachycardia Respiratory: CTABL Abdomen: diffuse mild tender,no guarding,no rebound, positive bowel sounds Skin: no rash Musculoskeletal:  No edema Psychiatric: anxious , shaky, flat affect Neurologic: slight confusion, impaired memory, no focal findings            Labs on Admission:  Basic Metabolic Panel:  Recent Labs Lab 01/16/16 1015  NA 131*  K 4.2  CL 94*  CO2 11*  GLUCOSE 65  BUN 55*  CREATININE 2.40*  CALCIUM 7.2*   Liver  Function Tests:  Recent Labs Lab 01/16/16 1015  AST 137*  ALT 93*  ALKPHOS 152*  BILITOT 1.6*  PROT 6.5  ALBUMIN 3.6    Recent Labs Lab 01/16/16 1015  LIPASE 144*   No results for input(s): AMMONIA in the last 168 hours. CBC:  Recent Labs Lab 01/16/16 1027  WBC 10.3  NEUTROABS 8.2*  HGB 11.8*  HCT 35.1*  MCV 92.6  PLT 190   Cardiac Enzymes:  Recent Labs Lab 01/16/16 1015  TROPONINI 0.07*    BNP (last 3 results) No results for input(s): BNP in the last 8760 hours.  ProBNP (last 3 results) No results for input(s): PROBNP in the last 8760 hours.  CBG: No results for input(s): GLUCAP in the last 168 hours.  Radiological Exams on Admission: No results found.    Assessment/Plan Present on Admission:  **None**  N/V/ab pain:   ua pending,  ct ab/pel with out contrast ( not able to tolerate oral contrast, cr elevated) concerns for colitis, stool studies ordered, start zosyn for intraabdominal source. Npo, continue ivf, supportive care   hypoxia initial on presentation, cxr no acute findings, better now, possible related not taking deep breath. Consider CTA is hypoxia persist and cr improved. Patient does have risk factor, has not been out of bed for two weeks, no lower extremity edema, lung clear on exam.   Hypotension/lactic acidosis, no fever, no leukocytosis, ua pending, cxr unremarkable, ctab/pel ? Colitis, stool studies pending ,start on zosyn She received multiple bolus in the ED ,bp still borderline, will start stress dose steroids.  Elevated lipase/lft: likely from alcohol ,ct ab/pel pancreas unremarkable, + fatty liver, no gall stone. Npo for now ,repeat labs in am  Hyponatremia: from alcohol use and dehydration, on ivf  Arf: likely prerenal, [place] pending, on ivf  Significant metabolic acidosis, bicarb x1, on d5/ LR  QTc prolongation, keep k>4, mag>2,avoid Qtc prolonging agent, repeat ekg in am.  Alcohol intoxication and withdrawal on  ciwa  Depression/alcohol abuse: denies SI/HI. Consider psych consult once medically stable.   DVT prophylaxis: lovenox  Consultants: none  Code Status: full   Family Communication:  Patient and daughter  Disposition Plan: admit to stepdown  Time spent:  Darrion Macaulay MD, PhD [facility] Pager [phone] If 7PM-7AM, please contact night-coverage at [email or web address], password [facility]

## 2016-01-16 NOTE — ED Notes (Signed)
Pt from home via EMS and [employer]. Pt daughter took IVC papers on pt due to excessive drinking ETOH, not getting out of bed, not been eating, and was found to be lying in urine, feces and vomit x4 days. Pt has only has approx 1/4 oz of liquor this am. Pt is A&O and in NAD. Pt daughter at bedside

## 2016-01-16 NOTE — ED Notes (Signed)
As I write this; she is receiving a bed bath; and I have begun a 2 liter IV NSS bolus (two nss per Aleris pump at 999/hour).  She is awake and oriented x 4 and is able to give much correct detail to Dr. Freida BusmanAllen about her medicines and medical history.  Her daughter remains with her also.  Monitor shows nsr without ectopy.

## 2016-01-16 NOTE — ED Provider Notes (Signed)
CSN: [id number]     Arrival date & time 01/16/16  6962 History   First MD Initiated Contact with Patient 01/16/16 1006     Chief Complaint  Patient presents with  . Medical Clearance  . Alcohol Problem     (Consider location/radiation/quality/duration/timing/severity/associated sxs/prior Treatment) Patient here for treatment for detox from alcohol. She is under IVC that was taken out by her daughter. Patient has been on an alcohol binge for the past 2 weeks. Has had no oral intake of nutrition 4 days. Has had abdominal discomfort without fever or chills. Some dyspnea but no cough or congestion. Has had dark stools as well. Has a history of hypertension and states compliance with her medications. Denies any suicidal or homicidal ideations but does admit to increased depression.   Patient is a 65 y.o. female presenting with alcohol problem. The history is provided by the patient.  Alcohol Problem    Past Medical History  Diagnosis Date  . Hypertension   . Alcoholism (HCC)   . Depression   . CP (cerebral palsy) (HCC)   . Seizures (HCC)     ETOH induced  . Complication of anesthesia     hard time waking up  . PONV (postoperative nausea and vomiting)    Past Surgical History  Procedure Laterality Date  . Breast surgery    . Tubal ligation    . Bladder surgery     No family history on file. Social History  Substance Use Topics  . Smoking status: Never Smoker   . Smokeless tobacco: Never Used  . Alcohol Use: Yes     Comment: 1 bottle wine per day x 2 weeks - 09/20/13   OB History    No data available     Review of Systems  All other systems reviewed and are negative.     Allergies  Macrodantin  Home Medications   Prior to Admission medications   Medication Sig Start Date End Date Taking? Authorizing Provider  ciprofloxacin (CIPRO) 500 MG tablet Take 1 tablet (500 mg total) by mouth 2 (two) times daily. 09/24/13   Nishant Dhungel, MD  feeding supplement, ENSURE,  (ENSURE) PUDG Take 1 Container by mouth 3 (three) times daily between meals. 09/24/13   Nishant Dhungel, MD  FLUoxetine (PROZAC) 40 MG capsule Take 1 capsule (40 mg total) by mouth daily. For depression. 03/19/13   Tamala Julian, PA-C  folic acid (FOLVITE) 1 MG tablet Take 1 tablet (1 mg total) by mouth daily. 09/24/13   Nishant Dhungel, MD  LORazepam (ATIVAN) 0.5 MG tablet Take 1 tablet (0.5 mg total) by mouth every 8 (eight) hours as needed for anxiety. 09/24/13   Nishant Dhungel, MD  losartan (COZAAR) 50 MG tablet Take 1 tablet (50 mg total) by mouth daily. For hypertension. 03/19/13   Tamala Julian, PA-C  Multiple Vitamin (MULTIVITAMIN WITH MINERALS) TABS tablet Take 1 tablet by mouth daily. 09/24/13   Nishant Dhungel, MD  thiamine 100 MG tablet Take 1 tablet (100 mg total) by mouth daily. 09/24/13   Nishant Dhungel, MD   BP 76/44 mmHg  Pulse 96  Temp(Src) 98.1 F (36.7 C) (Oral)  Resp 14  SpO2 100% Physical Exam  Constitutional: She is oriented to person, place, and time. She appears well-developed and well-nourished.  Non-toxic appearance. No distress.  HENT:  Head: Normocephalic and atraumatic.  Eyes: Conjunctivae, EOM and lids are normal. Pupils are equal, round, and reactive to light.  Neck: Normal range of motion. Neck  supple. No tracheal deviation present. No thyroid mass present.  Cardiovascular: Normal rate, regular rhythm and normal heart sounds.  Exam reveals no gallop.   No murmur heard. Pulmonary/Chest: Effort normal and breath sounds normal. No stridor. No respiratory distress. She has no decreased breath sounds. She has no wheezes. She has no rhonchi. She has no rales.  Abdominal: Soft. Normal appearance and bowel sounds are normal. She exhibits no distension and no ascites. There is no tenderness. There is no rigidity, no rebound, no guarding and no CVA tenderness.  Musculoskeletal: Normal range of motion. She exhibits no edema or tenderness.  Neurological: She is alert and  oriented to person, place, and time. She has normal strength. No cranial nerve deficit or sensory deficit. GCS eye subscore is 4. GCS verbal subscore is 5. GCS motor subscore is 6.  Skin: Skin is warm and dry. No abrasion and no rash noted.  Psychiatric: Her speech is normal and behavior is normal. Her affect is blunt.  Nursing note and vitals reviewed.   ED Course  Procedures (including critical care time) Labs Review Labs Reviewed  I-STAT CG4 LACTIC ACID, ED - Abnormal; Notable for the following:    Lactic Acid, Venous 2.43 (*)    All other components within normal limits  CBC WITH DIFFERENTIAL/PLATELET  COMPREHENSIVE METABOLIC PANEL  ETHANOL  URINE RAPID DRUG SCREEN, HOSP PERFORMED  TROPONIN I  PROTIME-INR  LIPASE, BLOOD  APTT    Imaging Review No results found. I have personally reviewed and evaluated these images and lab results as part of my medical decision-making.   EKG Interpretation None      MDM   Final diagnoses:  None    Patient's hypotension noted and treated with IV saline. Patient's blood pressure positive response to this. Does have bruising on her abdomen and obtain abdominal CT. Her alcohol level is supratherapeutic. No evidence of rib fracture appreciated. She denies any anginal type symptoms and elevated troponin noted and will check EKG.Patient's elevated troponin likely from demand ischemia due to her tachycardia. Results discussed with patient as well as her family as well as hospitalist and patient will be admitted  CRITICAL CARE Performed by: Toy BakerALLEN,Victoria Euceda T Total critical care time: 60 minutes Critical care time was exclusive of separately billable procedures and treating other patients. Critical care was necessary to treat or prevent imminent or life-threatening deterioration. Critical care was time spent personally by me on the following activities: development of treatment plan with patient and/or surrogate as well as nursing, discussions  with consultants, evaluation of patient's response to treatment, examination of patient, obtaining history from patient or surrogate, ordering and performing treatments and interventions, ordering and review of laboratory studies, ordering and review of radiographic studies, pulse oximetry and re-evaluation of patient's condition.    Lorre NickAnthony Francesco Provencal, MD 01/25/16 [phone]

## 2016-01-17 ENCOUNTER — Inpatient Hospital Stay (HOSPITAL_COMMUNITY): Payer: Medicare Other

## 2016-01-17 LAB — COMPREHENSIVE METABOLIC PANEL
ALBUMIN: 3.2 g/dL — AB (ref 3.5–5.0)
ALK PHOS: 132 U/L — AB (ref 38–126)
ALT: 84 U/L — ABNORMAL HIGH (ref 14–54)
ANION GAP: 17 — AB (ref 5–15)
AST: 133 U/L — ABNORMAL HIGH (ref 15–41)
BUN: 58 mg/dL — ABNORMAL HIGH (ref 6–20)
CALCIUM: 6.9 mg/dL — AB (ref 8.9–10.3)
CHLORIDE: 106 mmol/L (ref 101–111)
CO2: 14 mmol/L — AB (ref 22–32)
Creatinine, Ser: 2.25 mg/dL — ABNORMAL HIGH (ref 0.44–1.00)
GFR calc non Af Amer: 22 mL/min — ABNORMAL LOW (ref >60)
GFR, EST AFRICAN AMERICAN: 25 mL/min — AB (ref >60)
GLUCOSE: 136 mg/dL — AB (ref 65–99)
Potassium: 4 mmol/L (ref 3.5–5.1)
SODIUM: 137 mmol/L (ref 135–145)
Total Bilirubin: 1.9 mg/dL — ABNORMAL HIGH (ref 0.3–1.2)
Total Protein: 5.4 g/dL — ABNORMAL LOW (ref 6.5–8.1)

## 2016-01-17 LAB — GASTROINTESTINAL PANEL BY PCR, STOOL (REPLACES STOOL CULTURE)
ADENOVIRUS F40/41: NOT DETECTED
ASTROVIRUS: NOT DETECTED
CRYPTOSPORIDIUM: NOT DETECTED
CYCLOSPORA CAYETANENSIS: NOT DETECTED
Campylobacter species: NOT DETECTED
E. coli O157: NOT DETECTED
ENTAMOEBA HISTOLYTICA: NOT DETECTED
ENTEROPATHOGENIC E COLI (EPEC): NOT DETECTED
ENTEROTOXIGENIC E COLI (ETEC): NOT DETECTED
Enteroaggregative E coli (EAEC): NOT DETECTED
Giardia lamblia: NOT DETECTED
Norovirus GI/GII: NOT DETECTED
Plesimonas shigelloides: NOT DETECTED
ROTAVIRUS A: NOT DETECTED
Salmonella species: NOT DETECTED
Sapovirus (I, II, IV, and V): NOT DETECTED
Shiga like toxin producing E coli (STEC): NOT DETECTED
Shigella/Enteroinvasive E coli (EIEC): NOT DETECTED
VIBRIO CHOLERAE: NOT DETECTED
VIBRIO SPECIES: NOT DETECTED
YERSINIA ENTEROCOLITICA: NOT DETECTED

## 2016-01-17 LAB — C DIFFICILE QUICK SCREEN W PCR REFLEX
C DIFFICILE (CDIFF) TOXIN: POSITIVE — AB
C DIFFICLE (CDIFF) ANTIGEN: POSITIVE — AB
C Diff interpretation: DETECTED

## 2016-01-17 LAB — CBC
HCT: 31.8 % — ABNORMAL LOW (ref 36.0–46.0)
HEMOGLOBIN: 10.6 g/dL — AB (ref 12.0–15.0)
MCH: 31 pg (ref 26.0–34.0)
MCHC: 33.3 g/dL (ref 30.0–36.0)
MCV: 93 fL (ref 78.0–100.0)
PLATELETS: 142 10*3/uL — AB (ref 150–400)
RBC: 3.42 MIL/uL — AB (ref 3.87–5.11)
RDW: 15.7 % — ABNORMAL HIGH (ref 11.5–15.5)
WBC: 8.8 10*3/uL (ref 4.0–10.5)

## 2016-01-17 LAB — MAGNESIUM: Magnesium: 2.5 mg/dL — ABNORMAL HIGH (ref 1.7–2.4)

## 2016-01-17 LAB — LIPASE, BLOOD: LIPASE: 84 U/L — AB (ref 11–51)

## 2016-01-17 MED ORDER — CHLORDIAZEPOXIDE HCL 25 MG PO CAPS
25.0000 mg | ORAL_CAPSULE | Freq: Every day | ORAL | Status: AC
  Administered 2016-01-20: 25 mg via ORAL
  Filled 2016-01-17: qty 1

## 2016-01-17 MED ORDER — CHLORDIAZEPOXIDE HCL 25 MG PO CAPS
25.0000 mg | ORAL_CAPSULE | ORAL | Status: AC
  Administered 2016-01-19 (×2): 25 mg via ORAL
  Filled 2016-01-17 (×2): qty 1

## 2016-01-17 MED ORDER — CHLORDIAZEPOXIDE HCL 25 MG PO CAPS
25.0000 mg | ORAL_CAPSULE | Freq: Four times a day (QID) | ORAL | Status: AC
  Administered 2016-01-17 (×2): 25 mg via ORAL
  Filled 2016-01-17 (×2): qty 1

## 2016-01-17 MED ORDER — CHLORDIAZEPOXIDE HCL 25 MG PO CAPS
25.0000 mg | ORAL_CAPSULE | Freq: Three times a day (TID) | ORAL | Status: AC
  Administered 2016-01-18 (×3): 25 mg via ORAL
  Filled 2016-01-17 (×3): qty 1

## 2016-01-17 MED ORDER — LIDOCAINE VISCOUS 2 % MT SOLN
15.0000 mL | OROMUCOSAL | Status: DC | PRN
  Administered 2016-01-17: 15 mL via OROMUCOSAL
  Filled 2016-01-17 (×2): qty 15
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
## 2016-01-18 LAB — COMPREHENSIVE METABOLIC PANEL
ALK PHOS: 142 U/L — AB (ref 38–126)
ALT: 89 U/L — AB (ref 14–54)
AST: 134 U/L — ABNORMAL HIGH (ref 15–41)
Albumin: 2.7 g/dL — ABNORMAL LOW (ref 3.5–5.0)
Anion gap: 7 (ref 5–15)
BUN: 38 mg/dL — ABNORMAL HIGH (ref 6–20)
CALCIUM: 7.8 mg/dL — AB (ref 8.9–10.3)
CO2: 23 mmol/L (ref 22–32)
CREATININE: 1.04 mg/dL — AB (ref 0.44–1.00)
Chloride: 109 mmol/L (ref 101–111)
GFR, EST NON AFRICAN AMERICAN: 55 mL/min — AB (ref >60)
Glucose, Bld: 223 mg/dL — ABNORMAL HIGH (ref 65–99)
Potassium: 3.4 mmol/L — ABNORMAL LOW (ref 3.5–5.1)
Sodium: 139 mmol/L (ref 135–145)
TOTAL PROTEIN: 5.3 g/dL — AB (ref 6.5–8.1)
Total Bilirubin: 0.9 mg/dL (ref 0.3–1.2)

## 2016-01-18 LAB — CBC
HCT: 32 % — ABNORMAL LOW (ref 36.0–46.0)
HEMOGLOBIN: 10.9 g/dL — AB (ref 12.0–15.0)
MCH: 31 pg (ref 26.0–34.0)
MCHC: 34.1 g/dL (ref 30.0–36.0)
MCV: 90.9 fL (ref 78.0–100.0)
PLATELETS: 146 10*3/uL — AB (ref 150–400)
RBC: 3.52 MIL/uL — AB (ref 3.87–5.11)
RDW: 15.7 % — ABNORMAL HIGH (ref 11.5–15.5)
WBC: 8.6 10*3/uL (ref 4.0–10.5)

## 2016-01-18 LAB — GLUCOSE, CAPILLARY
GLUCOSE-CAPILLARY: 238 mg/dL — AB (ref 65–99)
GLUCOSE-CAPILLARY: 163 mg/dL — AB (ref 65–99)

## 2016-01-18 MED ORDER — ENOXAPARIN SODIUM 40 MG/0.4ML ~~LOC~~ SOLN
40.0000 mg | SUBCUTANEOUS | Status: DC
  Administered 2016-01-18 – 2016-01-22 (×5): 40 mg via SUBCUTANEOUS
  Filled 2016-01-18 (×5): qty 0.4

## 2016-01-18 MED ORDER — VANCOMYCIN 50 MG/ML ORAL SOLUTION
125.0000 mg | Freq: Four times a day (QID) | ORAL | Status: DC
  Administered 2016-01-18: 125 mg via ORAL
  Filled 2016-01-18 (×2): qty 2.5

## 2016-01-18 MED ORDER — METRONIDAZOLE 500 MG PO TABS
500.0000 mg | ORAL_TABLET | Freq: Three times a day (TID) | ORAL | Status: DC
Start: 2016-01-18 — End: 2016-01-21
  Administered 2016-01-18 – 2016-01-21 (×10): 500 mg via ORAL
  Filled 2016-01-18 (×10): qty 1

## 2016-01-18 MED ORDER — POTASSIUM CHLORIDE CRYS ER 20 MEQ PO TBCR
40.0000 meq | EXTENDED_RELEASE_TABLET | ORAL | Status: AC
  Administered 2016-01-18 (×2): 40 meq via ORAL
  Filled 2016-01-18: qty 2

## 2016-01-18 MED ORDER — CETYLPYRIDINIUM CHLORIDE 0.05 % MT LIQD
7.0000 mL | Freq: Two times a day (BID) | OROMUCOSAL | Status: DC
  Administered 2016-01-18 – 2016-01-23 (×8): 7 mL via OROMUCOSAL

## 2016-01-18 MED ORDER — INSULIN ASPART 100 UNIT/ML ~~LOC~~ SOLN
0.0000 [IU] | Freq: Three times a day (TID) | SUBCUTANEOUS | Status: DC
  Administered 2016-01-18: 5 [IU] via SUBCUTANEOUS
  Administered 2016-01-18 – 2016-01-19 (×2): 3 [IU] via SUBCUTANEOUS
  Administered 2016-01-19 (×2): 2 [IU] via SUBCUTANEOUS
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
## 2016-01-19 LAB — GLUCOSE, CAPILLARY
GLUCOSE-CAPILLARY: 154 mg/dL — AB (ref 65–99)
GLUCOSE-CAPILLARY: 136 mg/dL — AB (ref 65–99)
Glucose-Capillary: 152 mg/dL — ABNORMAL HIGH (ref 65–99)
Glucose-Capillary: 140 mg/dL — ABNORMAL HIGH (ref 65–99)
Glucose-Capillary: 114 mg/dL — ABNORMAL HIGH (ref 65–99)

## 2016-01-19 LAB — CBC
HEMATOCRIT: 33.6 % — AB (ref 36.0–46.0)
Hemoglobin: 11.5 g/dL — ABNORMAL LOW (ref 12.0–15.0)
MCH: 31.5 pg (ref 26.0–34.0)
MCHC: 34.2 g/dL (ref 30.0–36.0)
MCV: 92.1 fL (ref 78.0–100.0)
Platelets: 174 10*3/uL (ref 150–400)
RBC: 3.65 MIL/uL — ABNORMAL LOW (ref 3.87–5.11)
RDW: 15.9 % — AB (ref 11.5–15.5)
WBC: 8.5 10*3/uL (ref 4.0–10.5)

## 2016-01-19 LAB — COMPREHENSIVE METABOLIC PANEL
ALBUMIN: 2.7 g/dL — AB (ref 3.5–5.0)
ALT: 118 U/L — ABNORMAL HIGH (ref 14–54)
AST: 170 U/L — AB (ref 15–41)
Alkaline Phosphatase: 153 U/L — ABNORMAL HIGH (ref 38–126)
Anion gap: 4 — ABNORMAL LOW (ref 5–15)
BILIRUBIN TOTAL: 0.7 mg/dL (ref 0.3–1.2)
BUN: 20 mg/dL (ref 6–20)
CHLORIDE: 110 mmol/L (ref 101–111)
CO2: 25 mmol/L (ref 22–32)
Calcium: 8.1 mg/dL — ABNORMAL LOW (ref 8.9–10.3)
Creatinine, Ser: 0.65 mg/dL (ref 0.44–1.00)
GFR calc Af Amer: >60 mL/min (ref >60)
GFR calc non Af Amer: >60 mL/min (ref >60)
GLUCOSE: 173 mg/dL — AB (ref 65–99)
POTASSIUM: 4.2 mmol/L (ref 3.5–5.1)
SODIUM: 139 mmol/L (ref 135–145)
TOTAL PROTEIN: 5.4 g/dL — AB (ref 6.5–8.1)

## 2016-01-19 MED ORDER — PRO-STAT SUGAR FREE PO LIQD
30.0000 mL | Freq: Every day | ORAL | Status: DC
  Administered 2016-01-19 – 2016-01-23 (×4): 30 mL via ORAL
  Filled 2016-01-19 (×4): qty 30

## 2016-01-19 MED ORDER — ADULT MULTIVITAMIN W/MINERALS CH
1.0000 | ORAL_TABLET | Freq: Every day | ORAL | Status: DC
  Administered 2016-01-19 – 2016-01-23 (×5): 1 via ORAL
  Filled 2016-01-19 (×5): qty 1

## 2016-01-19 MED ORDER — PHENOL 1.4 % MT LIQD
1.0000 | OROMUCOSAL | Status: DC | PRN
  Administered 2016-01-20: 1 via OROMUCOSAL
  Filled 2016-01-19: qty 177

## 2016-01-19 NOTE — Progress Notes (Signed)
Initial Nutrition Assessment  DOCUMENTATION CODES:   Not applicable  INTERVENTION:  - Will order 30 mL Prostat once/day, this supplement provides 100 kcal and 15 grams of protein. - Will order daily multivitamin with minerals.  - Encourage PO intakes of meals and supplement. - RD will continue to monitor for needs.   NUTRITION DIAGNOSIS:   Inadequate oral intake related to poor appetite as evidenced by per patient/family report, meal completion < 50%.  GOAL:   Patient will meet greater than or equal to 90% of their needs  MONITOR:   PO intake, Supplement acceptance, Weight trends, Labs, Skin, I & O's  REASON FOR ASSESSMENT:   Low Braden  ASSESSMENT:   65 y.o. female [facility] due to alcohol abuse, her daughter IVC'd patient due to excessive drinking alcohol,per daughter, patient is not eating, has n/v/d, abdominal pain, patient has not been out of bed for the last two weeks and she is found lying  urine and feces. Her last drink was this morning, she lives with her husband. Patient currently with slight confusion and impaired memory, not able to provide reliable history.  Pt seen for low Braden. BMI indicates overweight. Per review, pt consumed 25% of all meals yesterday and 50% of breakfast this AM which pt reports was a cinnamon and raisin bagel and coffee (she states oatmeal was on the tray but she did not consume it). Pt states poor appetite x5 days PTA but that she was eating fast food brought to her by her husband despite heavily drinking. Pt denies nausea with eating since admission but states that due to heavy alcohol consumption PTA she has been having abdominal pain/tenderness and that this is sometimes worsened by PO intakes. Pt also very concerned about throat pain that has been ongoing and consistent for 7-10 days; she states that this is worse with PO intakes and that nearly all intakes irritate it.   Physical assessment shows no muscle or fat  wasting; mild edema present to extremities. Pt unsure of weight trends PTA and limited weight hx available in the chart for comparison with last recorded weight being in 2015.   Pt not meeting needs at this time.  Medications reviewed; sliding scale Novolog, 40 mEq oral KCl BID.  Labs reviewed; Ca: 8.1 mg/dL, CBG: 604136 mg/dL this AM, LFTs elevated and trending up. IVF: D5-LR @ 125 mL/hr (510 kcal).     Diet Order:  Diet Heart Room service appropriate?: Yes; Fluid consistency:: Thin  Skin:  Reviewed, no issues  Last BM:  7/17  Height:   Ht Readings from Last 1 Encounters:  01/16/16 5' 5.5" (1.664 m)    Weight:   Wt Readings from Last 1 Encounters:  01/17/16 168 lb 14 oz (76.6 kg)    Ideal Body Weight:  57.95 kg (kg)  BMI:  Body mass index is 27.66 kg/(m^2).  Estimated Nutritional Needs:   Kcal:  1500-1700   Protein:  60-70 grams  Fluid:  2 L/day  EDUCATION NEEDS:   No education needs identified at this time   Trenton GammonJessica Adasia Hoar, MS, RD, LDN Inpatient Clinical Dietitian Pager # [phone] After hours/weekend pager # [phone]
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
## 2016-01-19 NOTE — Progress Notes (Signed)
[facility] provided Adult Protective Services-Carmen Rema [place] with requested clinical information.

## 2016-01-20 LAB — CBC
HCT: 36.3 % (ref 36.0–46.0)
Hemoglobin: 12.4 g/dL (ref 12.0–15.0)
MCH: 31.3 pg (ref 26.0–34.0)
MCHC: 34.2 g/dL (ref 30.0–36.0)
MCV: 91.7 fL (ref 78.0–100.0)
PLATELETS: 200 10*3/uL (ref 150–400)
RBC: 3.96 MIL/uL (ref 3.87–5.11)
RDW: 15.7 % — AB (ref 11.5–15.5)
WBC: 8.8 10*3/uL (ref 4.0–10.5)

## 2016-01-20 LAB — GLUCOSE, CAPILLARY
GLUCOSE-CAPILLARY: 110 mg/dL — AB (ref 65–99)
Glucose-Capillary: 105 mg/dL — ABNORMAL HIGH (ref 65–99)
Glucose-Capillary: 111 mg/dL — ABNORMAL HIGH (ref 65–99)

## 2016-01-20 LAB — COMPREHENSIVE METABOLIC PANEL
ALBUMIN: 2.5 g/dL — AB (ref 3.5–5.0)
ALT: 119 U/L — ABNORMAL HIGH (ref 14–54)
ANION GAP: 5 (ref 5–15)
AST: 179 U/L — AB (ref 15–41)
Alkaline Phosphatase: 156 U/L — ABNORMAL HIGH (ref 38–126)
BUN: 10 mg/dL (ref 6–20)
CHLORIDE: 108 mmol/L (ref 101–111)
CO2: 26 mmol/L (ref 22–32)
Calcium: 8.1 mg/dL — ABNORMAL LOW (ref 8.9–10.3)
Creatinine, Ser: 0.52 mg/dL (ref 0.44–1.00)
GFR calc Af Amer: >60 mL/min (ref >60)
GFR calc non Af Amer: >60 mL/min (ref >60)
Glucose, Bld: 94 mg/dL (ref 65–99)
POTASSIUM: 3.4 mmol/L — AB (ref 3.5–5.1)
SODIUM: 139 mmol/L (ref 135–145)
TOTAL PROTEIN: 4.9 g/dL — AB (ref 6.5–8.1)
Total Bilirubin: 0.7 mg/dL (ref 0.3–1.2)

## 2016-01-20 LAB — MAGNESIUM: MAGNESIUM: 1.9 mg/dL (ref 1.7–2.4)

## 2016-01-20 MED ORDER — POTASSIUM CHLORIDE CRYS ER 20 MEQ PO TBCR
40.0000 meq | EXTENDED_RELEASE_TABLET | Freq: Once | ORAL | Status: AC
  Administered 2016-01-20: 40 meq via ORAL
  Filled 2016-01-20: qty 2

## 2016-01-20 NOTE — Evaluation (Signed)
Physical Therapy Evaluation Patient Details Name: Ashley Bradley MRN: [id number] DOB: Feb 26, 1951 Today's Date: 01/20/2016   History of Present Illness  Ashley Bradley is a 65 y.o. female admitted 01/16/16, IVC by daughter due to excessive drinking and  in bed x 2 weeks, hypotensive on admission. Historyy   of SZ, cerebral palsy*(independent)  Clinical Impression  The patient is very weak and deconditioned, requires 2 person assist for pivot transfer. HR up into 130's with activity, Sats >94% RA, dyspnea 3-4 /4 with  Mobility.  Pt admitted with above diagnosis. Pt currently with functional limitations due to the deficits listed below (see PT Problem List).  Pt will benefit from skilled PT to increase their independence and safety with mobility to allow discharge to the venue listed below.       Follow Up Recommendations SNF;Supervision/Assistance - 24 hour    Equipment Recommendations  None recommended by PT    Recommendations for Other Services       Precautions / Restrictions Precautions Precaution Comments: c Diff, flexiseal, monitor VS      Mobility  Bed Mobility Overal bed mobility: Needs Assistance Bed Mobility: Supine to Sit     Supine to sit: Max assist     General bed mobility comments: assist with legs and trunk, tardive movements. Slow balance responses  Transfers Overall transfer level: Needs assistance   Transfers: Sit to/from Stand;Stand Pivot Transfers Sit to Stand: Max assist;+2 physical assistance;+2 safety/equipment Stand pivot transfers: Max assist;+2 physical assistance;+2 safety/equipment       General transfer comment: attempted x 3 with RW, sat down immediately each time. Used 2 armhold assist to stand and pivot to recliner., legs are very weak and near buckleing.  HR 131, sats 94 on RA  Ambulation/Gait                Stairs            Wheelchair Mobility    Modified Rankin (Stroke Patients Only)       Balance Overall  balance assessment: Needs assistance Sitting-balance support: Bilateral upper extremity supported;Feet supported Sitting balance-Leahy Scale: Poor initially leaning backward, gradually able to sit at midline , improved after standing     Standing balance support: Bilateral upper extremity supported;During functional activity Standing balance-Leahy Scale: Zero                               Pertinent Vitals/Pain Pain Assessment: Faces Faces Pain Scale: Hurts even more Pain Location: all over Pain Descriptors / Indicators: Aching;Discomfort Pain Intervention(s): Limited activity within patient's tolerance    Home Living Family/patient expects to be discharged to:: Private residence Living Arrangements: Spouse/significant other Available Help at Discharge: Family Type of Home: House Home Access: Stairs to enter     Home Layout: Two level;Bed/bath upstairs Home Equipment: Environmental consultant - 2 wheels      Prior Function Level of Independence: Independent               Hand Dominance        Extremity/Trunk Assessment   Upper Extremity Assessment: Generalized weakness           Lower Extremity Assessment: Generalized weakness      Cervical / Trunk Assessment: Kyphotic  Communication   Communication: No difficulties  Cognition Arousal/Alertness: Awake/alert Behavior During Therapy: Flat affect Overall Cognitive Status: Impaired/Different from baseline   Orientation Level: Time     Following Commands: Follows one  step commands with increased time       General Comments: patient  stated "drinking" as to reason to be in hospital.    General Comments      Exercises        Assessment/Plan    PT Assessment Patient needs continued PT services  PT Diagnosis Difficulty walking;Generalized weakness;Acute pain;Altered mental status   PT Problem List Decreased strength;Decreased activity tolerance;Decreased mobility;Decreased balance;Decreased  cognition;Decreased knowledge of use of DME;Decreased safety awareness;Decreased knowledge of precautions;Cardiopulmonary status limiting activity  PT Treatment Interventions DME instruction;Gait training;Functional mobility training;Therapeutic activities;Therapeutic exercise;Patient/family education;Cognitive remediation   PT Goals (Current goals can be found in the Care Plan section)      Frequency Min 3X/week   Barriers to discharge Decreased caregiver support      Co-evaluation               End of Session   Activity Tolerance: Patient limited by fatigue Patient left: in chair;with call bell/phone within reach;with nursing/sitter in room Nurse Communication: Mobility status         Time: [id number] PT Time Calculation (min) (ACUTE ONLY): 16 min   Charges:   PT Evaluation $PT Eval Low Complexity: 1 Procedure     PT G CodesRada Hay:        Cherlyn Syring Elizabeth 01/20/2016, 9:15 AM Blanchard KelchKaren Jacquelyn Antony PT [phone]
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
## 2016-01-20 NOTE — [place] FL2 (Signed)
[place] MEDICAID FL2 LEVEL OF CARE SCREENING TOOL     IDENTIFICATION  Patient Name: Ashley Bradley Birthdate: [birth year] Sex: female Admission Date (Current Location): 01/16/2016  [facility] and [place] Number:  Producer, television/film/video and Address:  [facility],  [address] [place] [address]      Provider Number: [id number]  Attending Physician Name and Address:  Mir Vergie Living,*  Relative Name and Phone Number:       Current Level of Care: Hospital Recommended Level of Care: Skilled Nursing Facility Prior Approval Number:    Date Approved/Denied:   PASRR Number:    Discharge Plan: Home    Current Diagnoses: Patient Active Problem List   Diagnosis Date Noted  . Acute kidney injury (HCC) 01/16/2016  . Alcohol withdrawal (HCC) 01/16/2016  . Alcohol related seizure (HCC) 09/24/2013  . UTI (urinary tract infection) 09/24/2013  . Hyponatremia 09/20/2013  . Major depression (HCC) 03/19/2013  . Alcohol intoxication in relapsed alcoholic (HCC) 03/19/2013    Orientation RESPIRATION BLADDER Height & Weight     Self, Time, Situation, Place  Normal Indwelling catheter Weight: 168 lb 14 oz (76.6 kg) Height:  5' 5.5" (166.4 cm)  BEHAVIORAL SYMPTOMS/MOOD NEUROLOGICAL BOWEL NUTRITION STATUS      Continent Diet  AMBULATORY STATUS COMMUNICATION OF NEEDS Skin   Extensive Assist Verbally Normal                       Personal Care Assistance Level of Assistance  Bathing, Feeding, Dressing Bathing Assistance: Limited assistance Feeding assistance: Limited assistance Dressing Assistance: Limited assistance     Functional Limitations Info  Sight, Hearing, Speech Sight Info: Adequate Hearing Info: Adequate Speech Info: Adequate    SPECIAL CARE FACTORS FREQUENCY  PT (By licensed PT)     PT Frequency: 5              Contractures Contractures Info: Not present    Additional Factors Info  Code Status, Allergies Code Status Info:  Full Allergies Info: Macrodantin  Isolation: Enteric Precautions UV infections           Current Medications (01/20/2016):  This is the current hospital active medication list Current Facility-Administered Medications  Medication Dose Route Frequency Provider Last Rate Last Dose  . antiseptic oral rinse (CPC / CETYLPYRIDINIUM CHLORIDE 0.05%) solution 7 mL  7 mL Mouth Rinse BID Mir Vergie Living, MD   7 mL at 01/20/16 0931  . dextrose 5 % in lactated ringers infusion   Intravenous Continuous Albertine Grates, MD 125 mL/hr at 01/20/16 1153    . enoxaparin (LOVENOX) injection 40 mg  40 mg Subcutaneous Q24H Phylliss Blakes, RPH   40 mg at 01/19/16 2112  . feeding supplement (PRO-STAT SUGAR FREE 64) liquid 30 mL  30 mL Oral Daily Mir Vergie Living, MD   30 mL at 01/20/16 0928  . insulin aspart (novoLOG) injection 0-15 Units  0-15 Units Subcutaneous TID [facility] Mir Vergie Living, MD   2 Units at 01/19/16 1810  . lidocaine (XYLOCAINE) 2 % viscous mouth solution 15 mL  15 mL Mouth/Throat Q3H PRN Mir Vergie Living, MD   15 mL at 01/17/16 1021  . LORazepam (ATIVAN) injection 2-3 mg  2-3 mg Intravenous Q1H PRN Albertine Grates, MD   2 mg at 01/20/16 0530  . metroNIDAZOLE (FLAGYL) tablet 500 mg  500 mg Oral Q8H Mir Vergie Living, MD   500 mg at 01/20/16 1516  . multivitamin with minerals  tablet 1 tablet  1 tablet Oral Daily Mir Vergie LivingMohammed Ikramullah, MD   1 tablet at 01/20/16 [phone]  . ondansetron (ZOFRAN) injection 4 mg  4 mg Intravenous Q6H PRN Albertine GratesFang Xu, MD   4 mg at 01/18/16 0405  . phenol (CHLORASEPTIC) mouth spray 1 spray  1 spray Mouth/Throat PRN Mir Vergie LivingMohammed Ikramullah, MD   1 spray at 01/20/16 1518  . sodium chloride flush (NS) 0.9 % injection 3 mL  3 mL Intravenous Q12H Albertine GratesFang Xu, MD   3 mL at 01/19/16 2200     Discharge Medications: Please see discharge summary for a list of discharge medications.  Relevant Imaging Results:  Relevant Lab Results:   Additional  Information RU#[id number]  Clearance CootsNicole A Samanvitha Germany, LCSW

## 2016-01-20 NOTE — Progress Notes (Signed)
LCSWA discussed patient with CSW Forensic scientistdirector Hope Rife.  Recommends patient needs psychiatric evaluation. MD informed of needed consult.

## 2016-01-20 NOTE — Progress Notes (Signed)
LCSWA met with patient at bedside. She is alert and oriented at this time. Patient reports she has been drinking for the past two weeks to cope with possibly loosing two sisters to cancer. Patient reports she is currently unemployed because she just "stop" going to work twenty days ago due to stress.(per daughter patient has been seeing psychiatrist for two years and is prescribed Prozac and Adderall).  Patient reports, she lives at home with her 66 year old disabled husband and it is difficult for him to care for her. Patient says, "sometimes he tries to help and other times he chooses not to help". Patient reports he gives her alcohol upon her request and other times to avoid arguments. Patient reports, usually only taking three shots a day,but for the past two weeks she cannot recall the amount  consumed in a day.   Patient is aware of IVC and Adult Protective Services involvement at this time. LCSWA informed patient about documents that have to provided and discussed  her disposition to SNF. LCSWA expressed that SNF is her best option for her at this time due to limited ambulation and minimal support at home. Pt. declined and wants to go home with home health services, as done in the past. Patient reports, she can manage at home due to disability floors and access ramps built in the home for her husband.   Plan: LCSWA to inform RNCM about pt. Request for Home Health services at this time.   Kathrin Greathouse, Latanya Presser, MSW Clinical Social Worker 5E and Psychiatric Service Line [phone] 01/20/2016  12:03 PM

## 2016-01-21 ENCOUNTER — Inpatient Hospital Stay (HOSPITAL_COMMUNITY): Payer: Medicare Other

## 2016-01-21 DIAGNOSIS — R601 Generalized edema: Secondary | ICD-10-CM

## 2016-01-21 DIAGNOSIS — F1021 Alcohol dependence, in remission: Secondary | ICD-10-CM

## 2016-01-21 DIAGNOSIS — F329 Major depressive disorder, single episode, unspecified: Secondary | ICD-10-CM

## 2016-01-21 DIAGNOSIS — F1010 Alcohol abuse, uncomplicated: Secondary | ICD-10-CM

## 2016-01-21 DIAGNOSIS — E876 Hypokalemia: Secondary | ICD-10-CM

## 2016-01-21 LAB — CBC
HEMATOCRIT: 35.8 % — AB (ref 36.0–46.0)
HEMOGLOBIN: 12.1 g/dL (ref 12.0–15.0)
MCH: 31.3 pg (ref 26.0–34.0)
MCHC: 33.8 g/dL (ref 30.0–36.0)
MCV: 92.7 fL (ref 78.0–100.0)
Platelets: 194 10*3/uL (ref 150–400)
RBC: 3.86 MIL/uL — ABNORMAL LOW (ref 3.87–5.11)
RDW: 16.1 % — AB (ref 11.5–15.5)
WBC: 6.9 10*3/uL (ref 4.0–10.5)

## 2016-01-21 LAB — COMPREHENSIVE METABOLIC PANEL
ALT: 100 U/L — ABNORMAL HIGH (ref 14–54)
ANION GAP: 4 — AB (ref 5–15)
AST: 102 U/L — ABNORMAL HIGH (ref 15–41)
Albumin: 2.5 g/dL — ABNORMAL LOW (ref 3.5–5.0)
Alkaline Phosphatase: 164 U/L — ABNORMAL HIGH (ref 38–126)
BUN: 7 mg/dL (ref 6–20)
CALCIUM: 7.8 mg/dL — AB (ref 8.9–10.3)
CO2: 30 mmol/L (ref 22–32)
Chloride: 104 mmol/L (ref 101–111)
Creatinine, Ser: 0.54 mg/dL (ref 0.44–1.00)
GFR calc non Af Amer: >60 mL/min (ref >60)
GLUCOSE: 110 mg/dL — AB (ref 65–99)
POTASSIUM: 3.6 mmol/L (ref 3.5–5.1)
Sodium: 138 mmol/L (ref 135–145)
Total Bilirubin: 0.4 mg/dL (ref 0.3–1.2)
Total Protein: 5.1 g/dL — ABNORMAL LOW (ref 6.5–8.1)

## 2016-01-21 LAB — GLUCOSE, CAPILLARY
GLUCOSE-CAPILLARY: 97 mg/dL (ref 65–99)
GLUCOSE-CAPILLARY: 112 mg/dL — AB (ref 65–99)
GLUCOSE-CAPILLARY: 101 mg/dL — AB (ref 65–99)
Glucose-Capillary: 111 mg/dL — ABNORMAL HIGH (ref 65–99)
Glucose-Capillary: 107 mg/dL — ABNORMAL HIGH (ref 65–99)

## 2016-01-21 MED ORDER — FUROSEMIDE 20 MG PO TABS
20.0000 mg | ORAL_TABLET | Freq: Once | ORAL | Status: AC
  Administered 2016-01-21: 20 mg via ORAL
  Filled 2016-01-21: qty 1

## 2016-01-21 MED ORDER — FLUOXETINE HCL 20 MG PO CAPS
20.0000 mg | ORAL_CAPSULE | Freq: Every day | ORAL | Status: DC
  Administered 2016-01-22 – 2016-01-23 (×2): 20 mg via ORAL
  Filled 2016-01-21 (×2): qty 1

## 2016-01-21 MED ORDER — GABAPENTIN 300 MG PO CAPS
300.0000 mg | ORAL_CAPSULE | Freq: Every day | ORAL | Status: DC
  Administered 2016-01-21 – 2016-01-22 (×2): 300 mg via ORAL
  Filled 2016-01-21 (×2): qty 1

## 2016-01-21 MED ORDER — POTASSIUM CHLORIDE CRYS ER 20 MEQ PO TBCR
40.0000 meq | EXTENDED_RELEASE_TABLET | Freq: Once | ORAL | Status: AC
  Administered 2016-01-21: 40 meq via ORAL
  Filled 2016-01-21: qty 2

## 2016-01-21 MED ORDER — VANCOMYCIN 50 MG/ML ORAL SOLUTION
125.0000 mg | Freq: Four times a day (QID) | ORAL | Status: DC
  Administered 2016-01-21 – 2016-01-23 (×9): 125 mg via ORAL
  Filled 2016-01-21 (×11): qty 2.5

## 2016-01-21 MED ORDER — GUAIFENESIN-DM 100-10 MG/5ML PO SYRP
5.0000 mL | ORAL_SOLUTION | ORAL | Status: DC | PRN
  Administered 2016-01-22 – 2016-01-23 (×2): 5 mL via ORAL
  Filled 2016-01-21 (×2): qty 10

## 2016-01-21 NOTE — Consult Note (Addendum)
[facility] Psychiatry Consult   Reason for Consult:  "IVC by daughter" Referring Physician:  Dr. Renaee Munda Patient Identification: Ashley Bradley MRN:  [id number] Principal Diagnosis: <principal problem not specified> Diagnosis:   Patient Active Problem List   Diagnosis Date Noted  . Alcohol use disorder, moderate, in early remission, dependence ([place]) [F10.21] 01/21/2016  . Acute kidney injury ([place]) [N17.9] 01/16/2016  . Alcohol withdrawal ([place]) [F10.239] 01/16/2016  . Alcohol related seizure ([place]) [R56.9] 09/24/2013  . UTI (urinary tract infection) [N39.0] 09/24/2013  . Hyponatremia [E87.1] 09/20/2013  . Major depression ([place]) [F32.9] 03/19/2013  . Alcohol intoxication in relapsed alcoholic [facility]) [J67.341] 93/79/0240    Total Time spent with patient: 45 minutes  Subjective:   Ashley Bradley is a 65 y.o. female patient with history of alcohol use disorder, DTs, depression, cerebral palsy, hypertension who is admitted for alcohol withdrawal. Hospital course is complicated by Cdiff and hyponatremia. Psychiatry is consulted for 1. Evaluation of depression, 2. Capacity evaluation for medical decision of going back home, 3. whether IVC be  rescinded  HPI:   Per H&P documented by Dr Cooper Render was "brought to [facility] ED by EMS due to alcohol abuse, her daughter petitioned patient due to excessive drinking alcohol,per daughter, patient is not eating, has n/v/d, abdominal pain, patient has not been out of bed for the last two weeks and she is found lying urine and feces. Her last drink was this morning, she lives with her husband. Patient currently with slight confusion and impaired memory, not able to provide reliable history." "upon arrival she is found hypotensive sbp in the 70's sinus tachycardia in 110's, she was initially hypoxia reported due to not taking deep breath" EtOH on arrival 269, Utox negative. She was given scheduled librium then switched to CIWA.   Patient is interviewed at the  bedside.  She states that she feels "terrible" mentally and physically. She states that she feels depressed about her physical condition of nausea,diarrhea and also talks about her two sisters who have cancer. She reports increased amount of alcohol for the past two weeks, being stressed about her sisters, which led to admission this time. She strongly wishes to be abstinent from alcohol, and would like to get Vivitorol again (this was declined by her insurance company per report) She has been drinking a pint of vodka every day for the past two weeks. Her longest sobriety was for seven years, and she finds AA and family support were very helpful. She relapsed into alcohol about 10 years ago again, since she lost her job. She was last admitted for alcohol withdrawal (with seizure per report) in 2015. She used to see a therapist every couple of months but this therapist lost her credential. Although she initially states that she wants to go home after making sure she can walk, she agrees that she would follow recommendation by the team so that she remains safe.   She reports depressed mood since teenagers. She reports occasional insomnia. She denies SI. She denies AH/VH (except the time of admission she had some VH). She feels anxious. She denies paranoia.   Please see detailed information from the note written by Ms. Holoman, SW on 01/17/2016. Briefly, her daughter asked for welfare check concerned about her patient physical status. Sam, pt husband reportedly enabled drinking by purchasing vodka and bringing to her and he did not contact EMS or police because of negative social stigma. Daughter does not believe DV is an issue. APS is involved in this  case per chart.   Past Psychiatric History: alcohol use disorder, depression Last admission for alcohol withdrawal in 03/2013 per chart.  Other psychiatry admission- 11/2010 for alcohol use, depression, 1999 for detox at fellowship fall.   Risk to Self: Is  patient at risk for suicide?: No, but patient needs Medical Clearance Risk to Others:  No, no known aggressive behavior Prior Inpatient Therapy:  as above Prior Outpatient Therapy:   Counselor at [facility] named Creighton   Past Medical History:  Past Medical History  Diagnosis Date  . Hypertension   . Alcoholism ([place])   . Depression   . CP (cerebral palsy) ([place])   . Seizures ([place])     ETOH induced  . Complication of anesthesia     hard time waking up  . PONV (postoperative nausea and vomiting)     Past Surgical History  Procedure Laterality Date  . Breast surgery    . Tubal ligation    . Bladder surgery     Family History: History reviewed. No pertinent family history. Family Psychiatric  History: not obtained Social History:  History  Alcohol Use  . Yes    Comment: 1 bottle wine per day x 2 weeks - 09/20/13     History  Drug Use No    Social History   Social History  . Marital Status: Married    Spouse Name: N/A  . Number of Children: N/A  . Years of Education: N/A   Social History Main Topics  . Smoking status: Never Smoker   . Smokeless tobacco: Never Used  . Alcohol Use: Yes     Comment: 1 bottle wine per day x 2 weeks - 09/20/13  . Drug Use: No  . Sexual Activity: Not Asked   Other Topics Concern  . None   Social History Narrative   Additional Social History:  none  Allergies:   Allergies  Allergen Reactions  . Macrodantin [Nitrofurantoin] Nausea And Vomiting    Labs:  Results for orders placed or performed during the hospital encounter of 01/16/16 (from the past 48 hour(s))  Glucose, capillary     Status: Abnormal   Collection Time: 01/19/16  4:50 PM  Result Value Ref Range   Glucose-Capillary 140 (H) 65 - 99 mg/dL  Glucose, capillary     Status: Abnormal   Collection Time: 01/19/16  9:50 PM  Result Value Ref Range   Glucose-Capillary 114 (H) 65 - 99 mg/dL  CBC     Status: Abnormal   Collection Time: 01/20/16  5:17 AM  Result  Value Ref Range   WBC 8.8 4.0 - 10.5 K/uL   RBC 3.96 3.87 - 5.11 MIL/uL   Hemoglobin 12.4 12.0 - 15.0 g/dL   HCT 36.3 36.0 - 46.0 %   MCV 91.7 78.0 - 100.0 fL   MCH 31.3 26.0 - 34.0 pg   MCHC 34.2 30.0 - 36.0 g/dL   RDW 15.7 (H) 11.5 - 15.5 %   Platelets 200 150 - 400 K/uL  Comprehensive metabolic panel     Status: Abnormal   Collection Time: 01/20/16  5:17 AM  Result Value Ref Range   Sodium 139 135 - 145 mmol/L   Potassium 3.4 (L) 3.5 - 5.1 mmol/L    Comment: DELTA CHECK NOTED REPEATED TO VERIFY    Chloride 108 101 - 111 mmol/L   CO2 26 22 - 32 mmol/L   Glucose, Bld 94 65 - 99 mg/dL   BUN 10 6 - 20 mg/dL  Creatinine, Ser 0.52 0.44 - 1.00 mg/dL   Calcium 8.1 (L) 8.9 - 10.3 mg/dL   Total Protein 4.9 (L) 6.5 - 8.1 g/dL   Albumin 2.5 (L) 3.5 - 5.0 g/dL   AST 179 (H) 15 - 41 U/L   ALT 119 (H) 14 - 54 U/L   Alkaline Phosphatase 156 (H) 38 - 126 U/L   Total Bilirubin 0.7 0.3 - 1.2 mg/dL   GFR calc non Af Amer >60 >60 mL/min   GFR calc Af Amer >60 >60 mL/min    Comment: (NOTE) The eGFR has been calculated using the CKD EPI equation. This calculation has not been validated in all clinical situations. eGFR's persistently <60 mL/min signify possible Chronic Kidney Disease.    Anion gap 5 5 - 15  Magnesium     Status: None   Collection Time: 01/20/16  5:17 AM  Result Value Ref Range   Magnesium 1.9 1.7 - 2.4 mg/dL  Glucose, capillary     Status: Abnormal   Collection Time: 01/20/16  7:17 AM  Result Value Ref Range   Glucose-Capillary 110 (H) 65 - 99 mg/dL   Comment 1 Notify RN   Glucose, capillary     Status: Abnormal   Collection Time: 01/20/16 11:52 AM  Result Value Ref Range   Glucose-Capillary 105 (H) 65 - 99 mg/dL   Comment 1 Notify RN   Glucose, capillary     Status: Abnormal   Collection Time: 01/20/16  4:51 PM  Result Value Ref Range   Glucose-Capillary 111 (H) 65 - 99 mg/dL   Comment 1 Notify RN   Glucose, capillary     Status: Abnormal   Collection Time:  01/20/16  9:46 PM  Result Value Ref Range   Glucose-Capillary 111 (H) 65 - 99 mg/dL  CBC     Status: Abnormal   Collection Time: 01/21/16  5:13 AM  Result Value Ref Range   WBC 6.9 4.0 - 10.5 K/uL   RBC 3.86 (L) 3.87 - 5.11 MIL/uL   Hemoglobin 12.1 12.0 - 15.0 g/dL   HCT 35.8 (L) 36.0 - 46.0 %   MCV 92.7 78.0 - 100.0 fL   MCH 31.3 26.0 - 34.0 pg   MCHC 33.8 30.0 - 36.0 g/dL   RDW 16.1 (H) 11.5 - 15.5 %   Platelets 194 150 - 400 K/uL  Comprehensive metabolic panel     Status: Abnormal   Collection Time: 01/21/16  5:13 AM  Result Value Ref Range   Sodium 138 135 - 145 mmol/L   Potassium 3.6 3.5 - 5.1 mmol/L   Chloride 104 101 - 111 mmol/L   CO2 30 22 - 32 mmol/L   Glucose, Bld 110 (H) 65 - 99 mg/dL   BUN 7 6 - 20 mg/dL   Creatinine, Ser 0.54 0.44 - 1.00 mg/dL   Calcium 7.8 (L) 8.9 - 10.3 mg/dL   Total Protein 5.1 (L) 6.5 - 8.1 g/dL   Albumin 2.5 (L) 3.5 - 5.0 g/dL   AST 102 (H) 15 - 41 U/L   ALT 100 (H) 14 - 54 U/L   Alkaline Phosphatase 164 (H) 38 - 126 U/L   Total Bilirubin 0.4 0.3 - 1.2 mg/dL   GFR calc non Af Amer >60 >60 mL/min   GFR calc Af Amer >60 >60 mL/min    Comment: (NOTE) The eGFR has been calculated using the CKD EPI equation. This calculation has not been validated in all clinical situations. eGFR's persistently <60 mL/min signify  possible Chronic Kidney Disease.    Anion gap 4 (L) 5 - 15  Glucose, capillary     Status: None   Collection Time: 01/21/16  7:06 AM  Result Value Ref Range   Glucose-Capillary 97 65 - 99 mg/dL  Glucose, capillary     Status: Abnormal   Collection Time: 01/21/16 11:47 AM  Result Value Ref Range   Glucose-Capillary 112 (H) 65 - 99 mg/dL    Current Facility-Administered Medications  Medication Dose Route Frequency Provider Last Rate Last Dose  . antiseptic oral rinse (CPC / CETYLPYRIDINIUM CHLORIDE 0.05%) solution 7 mL  7 mL Mouth Rinse BID Mir Marry Guan, MD   7 mL at 01/21/16 1000  . dextrose 5 % in lactated  ringers infusion   Intravenous Continuous Florencia Reasons, MD 125 mL/hr at 01/21/16 1149    . enoxaparin (LOVENOX) injection 40 mg  40 mg Subcutaneous Q24H Thomes Lolling, RPH   40 mg at 01/20/16 2149  . feeding supplement (PRO-STAT SUGAR FREE 64) liquid 30 mL  30 mL Oral Daily Mir Marry Guan, MD   30 mL at 01/20/16 0928  . guaiFENesin-dextromethorphan (ROBITUSSIN DM) 100-10 MG/5ML syrup 5 mL  5 mL Oral Q4H PRN Modena Jansky, MD      . insulin aspart (novoLOG) injection 0-15 Units  0-15 Units Subcutaneous TID [facility] Mir Marry Guan, MD   2 Units at 01/19/16 1810  . lidocaine (XYLOCAINE) 2 % viscous mouth solution 15 mL  15 mL Mouth/Throat Q3H PRN Mir Marry Guan, MD   15 mL at 01/17/16 1021  . LORazepam (ATIVAN) injection 2-3 mg  2-3 mg Intravenous Q1H PRN Florencia Reasons, MD   2 mg at 01/21/16 0850  . metroNIDAZOLE (FLAGYL) tablet 500 mg  500 mg Oral Q8H Mir Marry Guan, MD   500 mg at 01/21/16 0601  . multivitamin with minerals tablet 1 tablet  1 tablet Oral Daily Mir Marry Guan, MD   1 tablet at 01/21/16 [phone]  . ondansetron (ZOFRAN) injection 4 mg  4 mg Intravenous Q6H PRN Florencia Reasons, MD   4 mg at 01/18/16 0405  . phenol (CHLORASEPTIC) mouth spray 1 spray  1 spray Mouth/Throat PRN Mir Marry Guan, MD   1 spray at 01/20/16 1518  . sodium chloride flush (NS) 0.9 % injection 3 mL  3 mL Intravenous Q12H Florencia Reasons, MD   3 mL at 01/20/16 2200    Musculoskeletal: Strength & Muscle Tone: within normal limits Gait & Station: not asssed due to current mental status Patient leans: N/A  Psychiatric Specialty Exam: Physical Exam  Neurological:  No tremors, no ridigity    Review of Systems  Constitutional: Positive for malaise/fatigue and diaphoresis.  HENT: Negative.   Eyes: Negative.   Respiratory: Negative.   Cardiovascular: Positive for palpitations.  Gastrointestinal: Positive for nausea, vomiting, abdominal pain and diarrhea.  Genitourinary: Negative.    Musculoskeletal: Negative.   Skin: Negative.   Neurological: Positive for tremors.  Endo/Heme/Allergies: Negative.   Psychiatric/Behavioral: Positive for depression.    Blood pressure 135/76, pulse 87, temperature 98.7 F (37.1 C), temperature source Oral, resp. rate 17, height 5' 5.5" (1.664 m), weight 168 lb 14 oz (76.6 kg), SpO2 97 %.Body mass index is 27.66 kg/(m^2).  General Appearance: Fairly Groomed  Eye Contact:  Good  Speech:  Normal Rate,   Volume:  Decreased  Mood:  Depressed  Affect:  Constricted  Thought Process:  Coherent  Orientation:  Full (Time, Place, and Person)  Thought  Content:  Negative  Suicidal Thoughts:  No  Homicidal Thoughts:  No  Memory:  mostly intact to recent and remote history. not formally assessed  Judgement:  Fair  Insight:  Fair  Psychomotor Activity:  Normal  Concentration:  Attention Span: Fair  Recall:  [employer] of Knowledge:  Fair  Language:  Fair  Akathisia:  No  Handed:    AIMS (if indicated):     Assets:  Communication Skills Others:  seeking for help  ADL's:  Intact  Cognition:  WNL  Sleep:      Assessment CHERIS TWETEN is a 65 y.o. female patient with history of alcohol use disorder, DTs, depression, cerebral palsy, hypertension who is admitted for alcohol withdrawal. Hospital course is complicated by Cdiff and hyponatremia. Psychiatry is consulted for 1. Evaluation of depression, 2. Capacity evaluation for medical decision of going back home, 3. whether IVC be rescinded.   # Alcohol use disorder Patient is at preparation stage for sobriety; although pt reports great benefit from Vivitorol, unfortunately this was declined by insurance company per patient report. Would not recommend restarting at this time either given her current liver function abnormality. Would recommend consider gabapentin which would be also beneficial for her anxiety and chronic back pain.   # MDD # r/o substance induced mood disorder There is  worsening in her depressed mood in the context of alcohol use and psychosocial stressors of her sister suffering cancer. It would be difficult to discern her mood in the setting of recent alcohol use. Regardless, she reports that she has been on fluoxetine for years with some benefit; would recommend restarting her medication with psychiatry follow up.   # Capacity evaluation for discharging to home.  She is agreeable to go to SNF or the place recommended by the team on today's interview; discussed with her SW, who also reports that patient was agreeable to go to SNF on yesterday's conversation. Would recommend validating her stress/anxiety during the discussion; patient appears to respond well to this.   # IVC status Patient has custody order being petitioned by her daughter. However, 2nd QPE was not filed, meaning that patient is at voluntary state now. Did not see the need for IVC this time given patient is agreeable to care and has no SI/HI.    Recommendations - Consider gabapentin for alcohol abstinence/anxiety/chronic back pain. Would recommend starting 300 mg qhs, then increase by 150 mg/day up to 900 mg (300 mg TID) if she tolerates it. This medication can be further titrated (up to 1200-1800 mg) in the future.  - Restart fluoxetine 20 mg daily; after two week, this can be increased up to 40 mg daily.  - Patient is agreeable to go to SNF based on our/SW conversation. Would recommend validating her stress/anxiety and give through explanation upon discussing this.  - Patient is on voluntary status. Did not appreciate the necessity for pursuing IVC at this time.  - Would recommend psychiatry outpatient follow up upon discharge; discussed with her SW.   Patient is at low risk to harm self; risk factors including substance use, depression, unemployment, chronic pain. Protective factors including being amenable to [place] treatment, seeking for help. She denies SI and no previous suicide attempts. She is  appropriate for outpatient follow up.   Treatment Plan Summary: Plan as above  Disposition: Patient does not meet criteria for psychiatric inpatient admission.  Thank you for your consult. Psychiatry will sign off. Please contact psychiatry consult if there are any  questions/concerns.  Norman Clay, MD 01/21/2016 12:38 PM

## 2016-01-21 NOTE — Clinical Social Work Placement (Deleted)
   CLINICAL SOCIAL WORK PLACEMENT  NOTE  Date:  01/21/2016  Patient Details  Name: Ashley Bradley MRN: [id number] Date of Birth: Dec 05, 1950  Clinical Social Work is seeking post-discharge placement for this patient at the Skilled  Nursing Facility level of care (*CSW will initial, date and re-position this form in  chart as items are completed):  Yes   Patient/family provided with [place] Clinical Social Work Department's list of facilities offering this level of care within the geographic area requested by the patient (or if unable, by the patient's family).  Yes   Patient/family informed of their freedom to choose among providers that offer the needed level of care, that participate in Medicare, Medicaid or managed care program needed by the patient, have an available bed and are willing to accept the patient.  Yes   Patient/family informed of [facility]'s ownership interest in [facility] and [facility], as well as of the fact that they are under no obligation to receive care at these facilities.  PASRR submitted to EDS on 01/19/16     PASRR number received on 01/19/16     Existing PASRR number confirmed on       FL2 transmitted to all facilities in geographic area requested by pt/family on 01/19/16     FL2 transmitted to all facilities within larger geographic area on 01/19/16     Patient informed that his/her managed care company has contracts with or will negotiate with certain facilities, including the following:        Yes   Patient/family informed of bed offers received.  [facility]     Physician [facility] and [facility]    [facility].  Patient to be transferred to facility by [facility]     Patient family notified on 01/21/16 of transfer.  Name of family member notified:  Sister     PHYSICIAN Please prepare priority discharge summary, including medications      Additional Comment:    _______________________________________________ Clearance CootsNicole A Donaven Criswell, LCSW 01/21/2016, 10:47 AM

## 2016-01-21 NOTE — Progress Notes (Signed)
7/19/2017LCSWA met with pt. And daughter at bedside. Patient is agreeable to SNF at this time. Waiting for insurance authorization.  01/21/2016 Discussed with patient following up with substance abuse rehab center outpatient after SNF.  Patient reports she will follow up with her counselor Claiborne Billings at [place], Latanya Presser, MSW Clinical Social Worker 5E and [place] [phone] 01/21/2016  1:45 PM

## 2016-01-21 NOTE — Progress Notes (Addendum)
PROGRESS NOTE  Ashley BowensDarlene S Bradley LKG:[id number] DOB: 1951/04/20 DOA: 01/16/2016 PCP: Neldon LabellaMILLER,[name], MD  Brief narrative 65 year old female with PMH of alcohol dependence, depression, alcohol-induced seizures, cerebral palsy, HTN, [facility] by EMS/GPD due to alcohol abuse and her daughter had involuntarily committed patient due to excessive alcohol drinking, not eating, nausea, vomiting, diarrhea, abdominal pain, had not been out of bed for last 2 weeks and she was found lying in her urine and feces. Her last alcohol drink was the morning of admission. Patient lives with her husband. On admission she was confused and was unable to provide reliable history. In the ED, she was hypotensive, tachycardic, hypoxic, sodium 131, creatinine 2.4, lipase 144, admitted for further evaluation.  65 yo F who was IVC and EMS called by daughter due to being in bed for 2 weeks doing nothing but drinking vodka supplied by her husband. She has been having abdominal pain and vomiting, she reportedly was found in bed lying in her own stool and vomitus. Found to be Cdiff positive and now has been ushered through alcohol withdrawal now with no signs of the above.  Assessment/Plan:  Nausea, vomiting, abdominal pain/C. difficile colitis - CT abdomen 01/16/16: Fatty infiltration of the liver. Wall thickening of the cecum is noted concerning for inflammatory or infectious colitis. - Renal ultrasound 7/16: Normal sonographic appearance of the kidneys bilaterally. - Subsequent C. difficile testing returned positive on 01/17/16. - Empirically started IV Zosyn was discontinued. - She was started on oral metronidazole 7/17 but continues to have persistent diarrhea. As per hospital C. difficile protocol, DC Flagyl and start oral vancomycin and add probiotics. - GI pathogen panel PCR negative.  Transient Hypoxia  On initial presentation, CXR with no acute findings. Possibly related to mental status.    Resolved. Repeat chest x-ray 7/20 shows basilar atelectasis without acute findings. Incentive spirometry.  Hypotension/lactic acidosis,  - no fever, no leukocytosis, ua unimpressive, cxr unremarkable, ctab/pel ? Colitis, likely now we know due to Cdiff colitis. - Both resolved.  Elevated lipase/lft:  - likely from alcohol ,ct ab/pel pancreas unremarkable, + fatty liver, no gall stone. Continue diet which she is tolerating just fine. LFTs were abnormal even in 2015 (AST 172 and ALT 93). Recommend outpatient follow-up of LFTs and workup as deemed necessary.  Hyponatremia:  - from alcohol use and dehydration - resolved  Acute kidney injury - Likely pre-renal d/t dehydration. Renal ultrasound unremarkable. Resolved.  Significant metabolic acidosis at admission - resolved  QTc prolongation - Resolved. QTC on 01/19/16:443  Alcohol intoxication and withdrawal  - on ciwa with librium taper. CIWA fluctuating b/w 1-8  Depression/alcohol abuse:   - denies SI/HI. - Daughter had involuntarily committed her prior to admission due to ongoing alcohol abuse, unable to care for self and at risk for self-harm. Psychiatric consultation requested for evaluation of depression, substance abuse, capacity to make medical decisions by herself and if IVC can be rescinded.  Dyspnea - Chest x-ray without edema or consolidation. Bibasilar atelectasis. Incentive spirometry.  Anasarca/leg edema - Likely secondary to hypoalbuminemia and IV fluid resuscitation. Provide dose of Lasix and follow.  Hypokalemia - Replaced. Magnesium 1.9.   DVT prophylaxis: Lovenox Code Status: FULL  Family Communication: Discussed extensively with patient's spouse at bedside. Disposition Plan: Likely to Rehab, she transferred to floor 7/18. PT consultation placed, CSW has been following. Patient wants to go home but she was not taken care of there was living in filth. Daughter involved and  wants her in Rehab, but patient has  capacity and is insisting on going home.   Consultants:  Psychiatry  Procedures:  None   Antimicrobials:  Zosyn 7/15 --> 7/17  PO Vanco 7/16 1 dose  PO Flagyl 7/17--> 7/20  Subjective - Ongoing diarrhea. Up to 3 times this morning as per sitter at bedside. Complaints of dyspnea, dry cough mostly at night. No chest pain. No suicidal or homicidal ideations.  Objective: Filed Vitals:   01/20/16 0440 01/20/16 1412 01/20/16 2105 01/21/16 0519  BP: 119/79 130/86 128/78 135/76  Pulse: 94 88 96 87  Temp: 98.7 F (37.1 C) 99.6 F (37.6 C) 98.8 F (37.1 C) 98.7 F (37.1 C)  TempSrc: Oral Oral Oral Oral  Resp: Height:      Weight:      SpO2: 98% 97% 98% 97%    Intake/Output Summary (Last 24 hours) at 01/21/16 1240 Last data filed at 01/21/16 1002  Gross per 24 hour  Intake   1480 ml  Output   3025 ml  Net  -1545 ml   Filed Weights   01/16/16 1500 01/17/16 0500  Weight: 73.6 kg (162 lb 4.1 oz) 76.6 kg (168 lb 14 oz)    Exam: General:  Pleasant middle-aged female lying comfortably supine in bed. Spouse and sitter at bedside. Cardiovascular: S1 and S2 heard, RRR. No JVD, murmurs. 2+ pitting bilateral leg edema. Respiratory: Diminished breath sounds in the bases. Otherwise clear to auscultation. No increased work of breathing. Abdomen: Nondistended, soft and nontender. Normal bowel sounds heard. Skin: dry, no rashes  Musculoskeletal: no joint effusions, normal range of motion  Psychiatric: Flat affect. Answers questions appropriately and a low tone of voice Neurologic: Alert and oriented. No focal neurological deficits.   Data Reviewed: CBC:  Recent Labs Lab 01/16/16 1027 01/17/16 0331 01/18/16 0318 01/19/16 0329 01/20/16 0517 01/21/16 0513  WBC 10.3 8.8 8.6 8.5 8.8 6.9  NEUTROABS 8.2*  --   --   --   --   --   HGB 11.8* 10.6* 10.9* 11.5* 12.4 12.1  HCT 35.1* 31.8* 32.0* 33.6* 36.3 35.8*  MCV 92.6 93.0 90.9 92.1 91.7 92.7  PLT 190 142* 146*  174 200 194   Basic Metabolic Panel:  Recent Labs Lab 01/16/16 1303 01/17/16 0331 01/18/16 0318 01/19/16 0329 01/20/16 0517 01/21/16 0513  NA 134* 137 139 139 139 138  K 4.3 4.0 3.4* 4.2 3.4* 3.6  CL 103 106 109 110 108 104  CO2 10* 14* GLUCOSE 72 136* 223* 173* 94 110*  BUN 51* 58* 38* CREATININE 2.19* 2.25* 1.04* 0.65 0.52 0.54  CALCIUM 6.7* 6.9* 7.8* 8.1* 8.1* 7.8*  MG 2.0 2.5*  --   --  1.9  --    GFR: Estimated Creatinine Clearance: 71.6 mL/min (by C-G formula based on Cr of 0.54). Liver Function Tests:  Recent Labs Lab 01/17/16 0331 01/18/16 0318 01/19/16 0329 01/20/16 0517 01/21/16 0513  AST 133* 134* 170* 179* 102*  ALT 84* 89* 118* 119* 100*  ALKPHOS 132* 142* 153* 156* 164*  BILITOT 1.9* 0.9 0.7 0.7 0.4  PROT 5.4* 5.3* 5.4* 4.9* 5.1*  ALBUMIN 3.2* 2.7* 2.7* 2.5* 2.5*    Recent Labs Lab 01/16/16 1015 01/17/16 0331  LIPASE 144* 84*    Recent Labs Lab 01/16/16 1303  AMMONIA 25   Coagulation Profile:  Recent Labs Lab 01/16/16 1027 01/16/16 1602  INR 1.11 1.16  Cardiac Enzymes:  Recent Labs Lab 01/16/16 1015  TROPONINI 0.07*   BNP (last 3 results) No results for input(s): PROBNP in the last 8760 hours. HbA1C: No results for input(s): HGBA1C in the last 72 hours. CBG:  Recent Labs Lab 01/20/16 1152 01/20/16 1651 01/20/16 2146 01/21/16 0706 01/21/16 1147  GLUCAP 105* 111* 111* 97 112*   Lipid Profile: No results for input(s): CHOL, HDL, LDLCALC, TRIG, CHOLHDL, LDLDIRECT in the last 72 hours. Thyroid Function Tests: No results for input(s): TSH, T4TOTAL, FREET4, T3FREE, THYROIDAB in the last 72 hours. Anemia Panel: No results for input(s): VITAMINB12, FOLATE, FERRITIN, TIBC, IRON, RETICCTPCT in the last 72 hours. Urine analysis:    Component Value Date/Time   COLORURINE YELLOW 01/16/2016 1600   APPEARANCEUR CLOUDY* 01/16/2016 1600   LABSPEC 1.017 01/16/2016 1600   PHURINE 5.0 01/16/2016 1600    GLUCOSEU NEGATIVE 01/16/2016 1600   HGBUR SMALL* 01/16/2016 1600   BILIRUBINUR SMALL* 01/16/2016 1600   KETONESUR 15* 01/16/2016 1600   PROTEINUR 30* 01/16/2016 1600   UROBILINOGEN 1.0 09/23/2013 0732   NITRITE NEGATIVE 01/16/2016 1600   LEUKOCYTESUR NEGATIVE 01/16/2016 1600    Recent Results (from the past 240 hour(s))  MRSA PCR Screening     Status: None   Collection Time: 01/16/16  1:57 PM  Result Value Ref Range Status   MRSA by PCR NEGATIVE NEGATIVE Final    Comment:        The GeneXpert MRSA Assay (FDA approved for NASAL specimens only), is one component of a comprehensive MRSA colonization surveillance program. It is not intended to diagnose MRSA infection nor to guide or monitor treatment for MRSA infections.   C difficile quick scan w PCR reflex     Status: Abnormal   Collection Time: 01/17/16  1:59 PM  Result Value Ref Range Status   C Diff antigen POSITIVE (A) NEGATIVE Final    Comment: CRITICAL RESULT CALLED TO, READ BACK BY AND VERIFIED WITH: HABIB,I RN [id number] COVINGTON,N    C Diff toxin POSITIVE (A) NEGATIVE Final    Comment: CRITICAL RESULT CALLED TO, READ BACK BY AND VERIFIED WITH: HABIB,I RN [id number] COVINGTON,N    C Diff interpretation Toxin producing C. difficile detected.  Final    Comment: CRITICAL RESULT CALLED TO, READ BACK BY AND VERIFIED WITH: HABIB,I RN [id number] COVINGTON,N   Gastrointestinal Panel by PCR , Stool     Status: None   Collection Time: 01/17/16  1:59 PM  Result Value Ref Range Status   Campylobacter species NOT DETECTED NOT DETECTED Final   Plesimonas shigelloides NOT DETECTED NOT DETECTED Final   Salmonella species NOT DETECTED NOT DETECTED Final   Yersinia enterocolitica NOT DETECTED NOT DETECTED Final   Vibrio species NOT DETECTED NOT DETECTED Final   Vibrio cholerae NOT DETECTED NOT DETECTED Final   Enteroaggregative E coli (EAEC) NOT DETECTED NOT DETECTED Final   Enteropathogenic E coli (EPEC) NOT DETECTED NOT  DETECTED Final   Enterotoxigenic E coli (ETEC) NOT DETECTED NOT DETECTED Final   Shiga like toxin producing E coli (STEC) NOT DETECTED NOT DETECTED Final   E. coli O157 NOT DETECTED NOT DETECTED Final   Shigella/Enteroinvasive E coli (EIEC) NOT DETECTED NOT DETECTED Final   Cryptosporidium NOT DETECTED NOT DETECTED Final   Cyclospora cayetanensis NOT DETECTED NOT DETECTED Final   Entamoeba histolytica NOT DETECTED NOT DETECTED Final   Giardia lamblia NOT DETECTED NOT DETECTED Final   Adenovirus F40/41 NOT DETECTED NOT DETECTED Final   Astrovirus  NOT DETECTED NOT DETECTED Final   Norovirus GI/GII NOT DETECTED NOT DETECTED Final   Rotavirus A NOT DETECTED NOT DETECTED Final   Sapovirus (I, II, IV, and V) NOT DETECTED NOT DETECTED Final      Studies: Dg Chest 2 View  01/21/2016  CLINICAL DATA:  Shortness of breath and cough for 3 weeks EXAM: CHEST  2 VIEW COMPARISON:  January 16, 2016 FINDINGS: There is mild bibasilar atelectasis. There is no edema or consolidation. Heart size and pulmonary vascularity are normal. No adenopathy. There is postoperative change in the lower cervical region. IMPRESSION: Patchy bibasilar atelectasis. No edema or consolidation. Stable cardiac silhouette. Electronically Signed   By: Bretta Bang III M.D.   On: 01/21/2016 11:58    Scheduled Meds: . antiseptic oral rinse  7 mL Mouth Rinse BID  . enoxaparin (LOVENOX) injection  40 mg Subcutaneous Q24H  . feeding supplement (PRO-STAT SUGAR FREE 64)  30 mL Oral Daily  . insulin aspart  0-15 Units Subcutaneous TID WC  . metroNIDAZOLE  500 mg Oral Q8H  . multivitamin with minerals  1 tablet Oral Daily  . sodium chloride flush  3 mL Intravenous Q12H    Continuous Infusions: . dextrose 5% lactated ringers 125 mL/hr at 01/21/16 1149     LOS: 5 days   Time spent: 21 minutes  Royale Swamy, MD, FACP, FHM. [facility] Pager [phone]  If 7PM-7AM, please contact night-coverage [email or web address] Password  [email or web address] 01/21/2016, 1:03 PM

## 2016-01-22 DIAGNOSIS — F1021 Alcohol dependence, in remission: Secondary | ICD-10-CM

## 2016-01-22 DIAGNOSIS — A047 Enterocolitis due to Clostridium difficile: Secondary | ICD-10-CM

## 2016-01-22 DIAGNOSIS — F10239 Alcohol dependence with withdrawal, unspecified: Principal | ICD-10-CM

## 2016-01-22 LAB — COMPREHENSIVE METABOLIC PANEL
ALBUMIN: 2.3 g/dL — AB (ref 3.5–5.0)
ALT: 76 U/L — AB (ref 14–54)
AST: 69 U/L — AB (ref 15–41)
Alkaline Phosphatase: 152 U/L — ABNORMAL HIGH (ref 38–126)
Anion gap: 3 — ABNORMAL LOW (ref 5–15)
BUN: 6 mg/dL (ref 6–20)
CHLORIDE: 105 mmol/L (ref 101–111)
CO2: 32 mmol/L (ref 22–32)
CREATININE: 0.49 mg/dL (ref 0.44–1.00)
Calcium: 7.7 mg/dL — ABNORMAL LOW (ref 8.9–10.3)
GFR calc Af Amer: >60 mL/min (ref >60)
GFR calc non Af Amer: >60 mL/min (ref >60)
GLUCOSE: 92 mg/dL (ref 65–99)
POTASSIUM: 3.4 mmol/L — AB (ref 3.5–5.1)
Sodium: 140 mmol/L (ref 135–145)
Total Bilirubin: 0.7 mg/dL (ref 0.3–1.2)
Total Protein: 4.6 g/dL — ABNORMAL LOW (ref 6.5–8.1)

## 2016-01-22 LAB — HEMOGLOBIN A1C
HEMOGLOBIN A1C: 4.9 % (ref 4.8–5.6)
MEAN PLASMA GLUCOSE: 94 mg/dL

## 2016-01-22 LAB — GLUCOSE, CAPILLARY: Glucose-Capillary: 78 mg/dL (ref 65–99)

## 2016-01-22 NOTE — Progress Notes (Signed)
Physical Therapy Treatment Patient Details Name: Ashley Bradley MRN: [id number] DOB: 1951/05/22 Today's Date: 01/22/2016    History of Present Illness Ashley Bradley is a 65 y.o. female admitted 01/16/16, IVC by daughter due to excessive drinking and  in bed x 2 weeks, hypotensive on admission. Historyy   of SZ, cerebral palsy*(independent)    PT Comments    The patient is much more alert and oriented and conversive. Thought processes appear near WNL. Plans for SNF soon.  Follow Up Recommendations  SNF;Supervision/Assistance - 24 hour     Equipment Recommendations  None recommended by PT    Recommendations for Other Services       Precautions / Restrictions Precautions Precaution Comments: c Diff, monitor VS- hr GOES UP, E r leg shorter and decreased active dorsiflexion, Knee hyperextends(Cerebral palsy)    Mobility  Bed Mobility Overal bed mobility: Needs Assistance Bed Mobility: Supine to Sit     Supine to sit: Min assist     General bed mobility comments: extra time to get trunk upright  Transfers Overall transfer level: Needs assistance Equipment used: Rolling walker (2 wheeled) Transfers: Sit to/from [employer] CorporationStand;Stand Pivot Transfers Sit to Stand: Mod assist Stand pivot transfers: Mod assist       General transfer comment: noted steppage on Right and  Knee hyperextends with weight. the right leg is shorter. patient states that she has never sued an AFO. pivot steps too recliner. stood again for practice  Ambulation/Gait                 Stairs            Wheelchair Mobility    Modified Rankin (Stroke Patients Only)       Balance                                    Cognition   Behavior During Therapy: WFL for tasks assessed/performed Overall Cognitive Status: Impaired/Different from baseline   Orientation Level: Time             General Comments: much better and  stated today was daughter'd BD    Exercises General  Exercises - Lower Extremity Ankle Circles/Pumps: AROM;Both;5 reps Long Arc Quad: AROM;Both;5 reps;Seated Hip ABduction/ADduction: AROM;Both;Seated;5 reps Hip Flexion/Marching: AROM;Both;5 reps;Seated    General Comments        Pertinent Vitals/Pain Pain Assessment: No/denies pain Faces Pain Scale: Hurts little more Pain Location: back Pain Descriptors / Indicators: Aching Pain Intervention(s): Limited activity within patient's tolerance    Home Living                      Prior Function            PT Goals (current goals can now be found in the care plan section) Progress towards PT goals: Progressing toward goals    Frequency  Min 3X/week    PT Plan Current plan remains appropriate    Co-evaluation             End of Session   Activity Tolerance: Patient tolerated treatment well Patient left: in chair;with call bell/phone within reach;with chair alarm set     Time: [id number] PT Time Calculation (min) (ACUTE ONLY): 29 min  Charges:  $Therapeutic Activity: 23-37 mins                    G Codes:  Rada Hay 01/22/2016, 4:05 PM

## 2016-01-22 NOTE — Progress Notes (Addendum)
LCSWA met patient and husband at bedside. Patient has been accepted to [facility]. Patient reports feeling happy to placed there as she feels she will recover quickly. Pt. Husband and daugher aware they will need to fillout documents for facility admission process.  Patient agrees to go via [place] and understands weekend social worker will assist with disposition.

## 2016-01-22 NOTE — Progress Notes (Signed)
PROGRESS NOTE  Ashley Bradley ZOX:[id number] DOB: 09-10-1950 DOA: 01/16/2016 PCP: [name], MD  Brief narrative 65 year old female with PMH of alcohol dependence, depression, alcohol-induced seizures, cerebral palsy, HTN, brought to the [facility] ED by EMS/[facility] due to alcohol abuse and her daughter had involuntarily committed patient due to excessive alcohol drinking, not eating, nausea, vomiting, diarrhea, abdominal pain, had not been out of bed for last 2 weeks and she was found lying in her urine and feces. Her last alcohol drink was the morning of admission. Patient lives with her husband. On admission she was confused and was unable to provide reliable history. In the ED, she was hypotensive, tachycardic, hypoxic, sodium 131, creatinine 2.4, lipase 144, admitted for further evaluation.  65 yo F who was IVC and EMS called by daughter due to being in bed for 2 weeks doing nothing but drinking vodka supplied by her husband. She has been having abdominal pain and vomiting, she reportedly was found in bed lying in her own stool and vomitus. Found to be Cdiff positive and now has been ushered through alcohol withdrawal now with no signs of the above.  Assessment/Plan:  Nausea, vomiting, abdominal pain/C. difficile colitis - Still having diarrhea - CT abdomen 01/16/16: Fatty infiltration of the liver. Wall thickening of the cecum is noted concerning for inflammatory or infectious colitis. - Renal ultrasound 7/16: Normal sonographic appearance of the kidneys bilaterally. - Subsequent C. difficile testing returned positive on 01/17/16. - Empirically started IV Zosyn was discontinued. - She was started on oral metronidazole 7/17 but continues to have persistent diarrhea. As per hospital C. difficile protocol, DC Flagyl and start oral vancomycin and add probiotics. - GI pathogen panel PCR negative.  Transient Hypoxia - Resolved On initial presentation, CXR with no acute findings.  Possibly related to mental status.  Resolved. Repeat chest x-ray 7/20 shows basilar atelectasis without acute findings. Incentive spirometry.  Hypotension/lactic acidosis,  - no fever, no leukocytosis, ua unimpressive, cxr unremarkable, ctab/pel ? Colitis, likely now we know due to Cdiff colitis. - Both resolved.  Elevated lipase/lft:  - likely from alcohol ,ct ab/pel pancreas unremarkable, + fatty liver, no gall stone. Continue diet which she is tolerating just fine. LFTs were abnormal even in 2015 (AST 172 and ALT 93). Recommend outpatient follow-up of LFTs and workup as deemed necessary.  Hyponatremia:  - from alcohol use and dehydration - resolved  Acute kidney injury - Likely pre-renal d/t dehydration. Renal ultrasound unremarkable. Resolved.  Significant metabolic acidosis at admission - resolved  QTc prolongation - Resolved. QTC on 01/19/16:443  Alcohol intoxication and withdrawal  - on ciwa with librium taper. CIWA fluctuating b/w 1-8  Depression/alcohol abuse:   - denies SI/HI. - Daughter had involuntarily committed her prior to admission due to ongoing alcohol abuse, unable to care for self and at risk for self-harm. Psychiatric consultation requested for evaluation of depression, substance abuse, capacity to make medical decisions by herself and if IVC can be rescinded.  Dyspnea - Chest x-ray without edema or consolidation. Bibasilar atelectasis. Incentive spirometry.  Anasarca/leg edema - Likely secondary to hypoalbuminemia and IV fluid resuscitation. Provide dose of Lasix and follow.  Hypokalemia - Replaced. Magnesium 1.9.   DVT prophylaxis: Lovenox Code Status: FULL  Family Communication: Discussed extensively with patient's spouse at bedside. Disposition Plan: Likely to Rehab, she transferred to floor 7/18. PT consultation placed, CSW has been following. Patient wants to go home but she was not taken care of there was living in  filth. Daughter involved and  wants her in Rehab, but patient has capacity and is insisting on going home.   Consultants:  Psychiatry  Procedures:  None   Antimicrobials:  Zosyn 7/15 --> 7/17  PO Vanco 7/16 1 dose  PO Flagyl 7/17--> 7/20  Subjective - Still having diarrhea. No other complaints.   Objective: Filed Vitals:   01/21/16 2045 01/22/16 0000 01/22/16 0500 01/22/16 0539  BP: 154/59   131/76  Pulse: 77   85  Temp: 97.8 F (36.6 C)   99.1 F (37.3 C)  TempSrc: Oral   Oral  Resp: 18   17  Height:   (1.651 m)    Weight:  74 kg (163 lb 2.3 oz) 74 kg (163 lb 2.3 oz)   SpO2: 100%   97%    Intake/Output Summary (Last 24 hours) at 01/22/16 1203 Last data filed at 01/22/16 1155  Gross per 24 hour  Intake   1470 ml  Output   3050 ml  Net  -1580 ml   Filed Weights   01/17/16 0500 01/22/16 0000 01/22/16 0500  Weight: 76.6 kg (168 lb 14 oz) 74 kg (163 lb 2.3 oz) 74 kg (163 lb 2.3 oz)    Exam: General:  Pleasant middle-aged female lying comfortably supine in bed. Spouse and sitter at bedside. Cardiovascular: S1 and S2 heard, RRR. No JVD, murmurs. 2+ pitting bilateral leg edema. Respiratory: Diminished breath sounds in the bases. Otherwise clear to auscultation. No increased work of breathing. Abdomen: Nondistended, soft and nontender. Normal bowel sounds heard. Skin: dry, no rashes  Musculoskeletal: no joint effusions, normal range of motion  Psychiatric: Flat affect. Answers questions appropriately and a low tone of voice Neurologic: Alert and oriented. No focal neurological deficits.   Data Reviewed: CBC:  Recent Labs Lab 01/16/16 1027 01/17/16 0331 01/18/16 0318 01/19/16 0329 01/20/16 0517 01/21/16 0513  WBC 10.3 8.8 8.6 8.5 8.8 6.9  NEUTROABS 8.2*  --   --   --   --   --   HGB 11.8* 10.6* 10.9* 11.5* 12.4 12.1  HCT 35.1* 31.8* 32.0* 33.6* 36.3 35.8*  MCV 92.6 93.0 90.9 92.1 91.7 92.7  PLT 190 142* 146* 174 200 194   Basic Metabolic Panel:  Recent Labs Lab  01/16/16 1303 01/17/16 0331 01/18/16 0318 01/19/16 0329 01/20/16 0517 01/21/16 0513 01/22/16 0452  NA 134* 137 139 139 139 138 140  K 4.3 4.0 3.4* 4.2 3.4* 3.6 3.4*  CL 103 106 109 110 108 104 105  CO2 10* 14* 32  GLUCOSE 72 136* 223* 173* 94 110* 92  BUN 51* 58* 38* CREATININE 2.19* 2.25* 1.04* 0.65 0.52 0.54 0.49  CALCIUM 6.7* 6.9* 7.8* 8.1* 8.1* 7.8* 7.7*  MG 2.0 2.5*  --   --  1.9  --   --    GFR: Estimated Creatinine Clearance: 69.7 mL/min (by C-G formula based on Cr of 0.49). Liver Function Tests:  Recent Labs Lab 01/18/16 0318 01/19/16 0329 01/20/16 0517 01/21/16 0513 01/22/16 0452  AST 134* 170* 179* 102* 69*  ALT 89* 118* 119* 100* 76*  ALKPHOS 142* 153* 156* 164* 152*  BILITOT 0.9 0.7 0.7 0.4 0.7  PROT 5.3* 5.4* 4.9* 5.1* 4.6*  ALBUMIN 2.7* 2.7* 2.5* 2.5* 2.3*    Recent Labs Lab 01/16/16 1015 01/17/16 0331  LIPASE 144* 84*    Recent Labs Lab 01/16/16 1303  AMMONIA 25   Coagulation Profile:  Recent Labs  Lab 01/16/16 1027 01/16/16 1602  INR 1.11 1.16   Cardiac Enzymes:  Recent Labs Lab 01/16/16 1015  TROPONINI 0.07*   BNP (last 3 results) No results for input(s): PROBNP in the last 8760 hours. HbA1C:  Recent Labs  01/21/16 0513  HGBA1C 4.9   CBG:  Recent Labs Lab 01/21/16 0706 01/21/16 1147 01/21/16 1639 01/21/16 2045 01/22/16 0708  GLUCAP 97 112* 101* 107* 78   Lipid Profile: No results for input(s): CHOL, HDL, LDLCALC, TRIG, CHOLHDL, LDLDIRECT in the last 72 hours. Thyroid Function Tests: No results for input(s): TSH, T4TOTAL, FREET4, T3FREE, THYROIDAB in the last 72 hours. Anemia Panel: No results for input(s): VITAMINB12, FOLATE, FERRITIN, TIBC, IRON, RETICCTPCT in the last 72 hours. Urine analysis:    Component Value Date/Time   COLORURINE YELLOW 01/16/2016 1600   APPEARANCEUR CLOUDY* 01/16/2016 1600   LABSPEC 1.017 01/16/2016 1600   PHURINE 5.0 01/16/2016 1600   GLUCOSEU NEGATIVE  01/16/2016 1600   HGBUR SMALL* 01/16/2016 1600   BILIRUBINUR SMALL* 01/16/2016 1600   KETONESUR 15* 01/16/2016 1600   PROTEINUR 30* 01/16/2016 1600   UROBILINOGEN 1.0 09/23/2013 0732   NITRITE NEGATIVE 01/16/2016 1600   LEUKOCYTESUR NEGATIVE 01/16/2016 1600    Recent Results (from the past 240 hour(s))  MRSA PCR Screening     Status: None   Collection Time: 01/16/16  1:57 PM  Result Value Ref Range Status   MRSA by PCR NEGATIVE NEGATIVE Final    Comment:        The GeneXpert MRSA Assay (FDA approved for NASAL specimens only), is one component of a comprehensive MRSA colonization surveillance program. It is not intended to diagnose MRSA infection nor to guide or monitor treatment for MRSA infections.   C difficile quick scan w PCR reflex     Status: Abnormal   Collection Time: 01/17/16  1:59 PM  Result Value Ref Range Status   C Diff antigen POSITIVE (A) NEGATIVE Final    Comment: CRITICAL RESULT CALLED TO, READ BACK BY AND VERIFIED WITH: HABIB,I RN [id number] COVINGTON,N    C Diff toxin POSITIVE (A) NEGATIVE Final    Comment: CRITICAL RESULT CALLED TO, READ BACK BY AND VERIFIED WITH: HABIB,I RN [id number] COVINGTON,N    C Diff interpretation Toxin producing C. difficile detected.  Final    Comment: CRITICAL RESULT CALLED TO, READ BACK BY AND VERIFIED WITH: HABIB,I RN [id number] COVINGTON,N   Gastrointestinal Panel by PCR , Stool     Status: None   Collection Time: 01/17/16  1:59 PM  Result Value Ref Range Status   Campylobacter species NOT DETECTED NOT DETECTED Final   Plesimonas shigelloides NOT DETECTED NOT DETECTED Final   Salmonella species NOT DETECTED NOT DETECTED Final   Yersinia enterocolitica NOT DETECTED NOT DETECTED Final   Vibrio species NOT DETECTED NOT DETECTED Final   Vibrio cholerae NOT DETECTED NOT DETECTED Final   Enteroaggregative E coli (EAEC) NOT DETECTED NOT DETECTED Final   Enteropathogenic E coli (EPEC) NOT DETECTED NOT DETECTED Final    Enterotoxigenic E coli (ETEC) NOT DETECTED NOT DETECTED Final   Shiga like toxin producing E coli (STEC) NOT DETECTED NOT DETECTED Final   E. coli O157 NOT DETECTED NOT DETECTED Final   Shigella/Enteroinvasive E coli (EIEC) NOT DETECTED NOT DETECTED Final   Cryptosporidium NOT DETECTED NOT DETECTED Final   Cyclospora cayetanensis NOT DETECTED NOT DETECTED Final   Entamoeba histolytica NOT DETECTED NOT DETECTED Final   Giardia lamblia NOT DETECTED NOT DETECTED Final  Adenovirus F40/41 NOT DETECTED NOT DETECTED Final   Astrovirus NOT DETECTED NOT DETECTED Final   Norovirus GI/GII NOT DETECTED NOT DETECTED Final   Rotavirus A NOT DETECTED NOT DETECTED Final   Sapovirus (I, II, IV, and V) NOT DETECTED NOT DETECTED Final      Studies: No results found.  Scheduled Meds: . antiseptic oral rinse  7 mL Mouth Rinse BID  . enoxaparin (LOVENOX) injection  40 mg Subcutaneous Q24H  . feeding supplement (PRO-STAT SUGAR FREE 64)  30 mL Oral Daily  . FLUoxetine  20 mg Oral Daily  . gabapentin  300 mg Oral QHS  . multivitamin with minerals  1 tablet Oral Daily  . sodium chloride flush  3 mL Intravenous Q12H  . vancomycin  125 mg Oral QID    Continuous Infusions:     LOS: 6 days   Time spent: 21 minutes  Barnetta ChapelSylvester I Aissatou Fronczak, MD. [facility] Pager [phone].  If 7PM-7AM, please contact night-coverage [email or web address] Password [email or web address] 01/22/2016, 12:03 PM

## 2016-01-22 NOTE — Progress Notes (Addendum)
Went into pt's room, pt was kneeling by bed on floor.  Both CNA's assisting pt from chair to bed were present, along with pt's husband.  Per CNA's and pt, the pt did not follow instructions on transfer and tried to dive on the bed.  Patient tearful but had no complaints of pain. Notified MD. No new orders placed.

## 2016-01-23 DIAGNOSIS — N179 Acute kidney failure, unspecified: Secondary | ICD-10-CM

## 2016-01-23 LAB — CREATININE, SERUM
CREATININE: 0.44 mg/dL (ref 0.44–1.00)
GFR calc Af Amer: >60 mL/min (ref >60)

## 2016-01-23 MED ORDER — GABAPENTIN 300 MG PO CAPS
300.0000 mg | ORAL_CAPSULE | Freq: Every day | ORAL | Status: DC
Start: 2016-01-23 — End: 2016-01-29

## 2016-01-23 MED ORDER — LIDOCAINE VISCOUS 2 % MT SOLN: 15.0000 mL | OROMUCOSAL | Status: DC | PRN

## 2016-01-23 MED ORDER — LORAZEPAM 0.5 MG PO TABS: 0.5000 mg | ORAL_TABLET | Freq: Three times a day (TID) | ORAL | Status: DC | PRN

## 2016-01-23 MED ORDER — VANCOMYCIN 50 MG/ML ORAL SOLUTION: 125.0000 mg | Freq: Four times a day (QID) | ORAL | Status: DC

## 2016-01-23 MED ORDER — PRO-STAT SUGAR FREE PO LIQD: 30.0000 mL | Freq: Every day | ORAL | Status: DC

## 2016-01-23 MED ORDER — FLUOXETINE HCL 20 MG PO CAPS: 20.0000 mg | ORAL_CAPSULE | Freq: Every day | ORAL | Status: DC

## 2016-01-23 MED ORDER — ADULT MULTIVITAMIN W/MINERALS CH: 1.0000 | ORAL_TABLET | Freq: Every day | ORAL | Status: AC

## 2016-01-23 NOTE — Clinical Social Work Placement (Signed)
Patient is set to discharge to [facility] today. Patient & daughter, [name] made aware. Discharge packet given to RN, [name]. [facility] called for transport to pickup at 4:00pm.     Lincoln Maxin, LCSW [facility] Clinical Social Worker cell #: [phone]    CLINICAL SOCIAL WORK PLACEMENT  NOTE  Date:  01/23/2016  Patient Details  Name: Ashley Bradley MRN: [id number] Date of Birth: April 27, 1951  Clinical Social Work is seeking post-discharge placement for this patient at the Skilled  Nursing Facility level of care (*CSW will initial, date and re-position this form in  chart as items are completed):  Yes   Patient/family provided with [facility] Clinical Social Work Department's list of facilities offering this level of care within the geographic area requested by the patient (or if unable, by the patient's family).  Yes   Patient/family informed of their freedom to choose among providers that offer the needed level of care, that participate in Medicare, Medicaid or managed care program needed by the patient, have an available bed and are willing to accept the patient.  Yes   Patient/family informed of [facility]'s ownership interest in [facility] and [facility], as well as of the fact that they are under no obligation to receive care at these facilities.  PASRR submitted to EDS on 01/19/16     PASRR number received on 01/19/16     Existing PASRR number confirmed on       FL2 transmitted to all facilities in geographic area requested by pt/family on 01/19/16     FL2 transmitted to all facilities within larger geographic area on 01/19/16     Patient informed that his/her managed care company has contracts with or will negotiate with certain facilities, including the following:        Yes   Patient/family informed of bed offers received.  Patient chooses bed at [facility]     Physician recommends and patient chooses bed at [facility]     Patient to be transferred to [facility] on 01/23/16.  Patient to be transferred to facility by [facility]     Patient family notified on 01/23/16 of transfer.  Name of family member notified:  patient's daughter, [name] via phone     PHYSICIAN       Additional Comment:    _______________________________________________ [name], LCSW 01/23/2016, 1:03 PM

## 2016-01-23 NOTE — Progress Notes (Signed)
Spoke with Dr. Dartha Lodge about pt foley.  MD stated he wanted foley to remain in place.

## 2016-01-23 NOTE — Progress Notes (Signed)
Report called to [place] at [facility].  Pt IV removed.  Pt placed in paper scrubs for transport.  Pt transported to [facility] via [place].

## 2016-01-23 NOTE — Discharge Summary (Signed)
Physician Discharge Summary  Ashley Bradley:[id number] DOB: 1951/05/03 DOA: 01/16/2016  PCP: [name], MD  Admit date: 01/16/2016 Discharge date: 01/23/2016  Time spent: Greater than 30 minutes  Recommendations for Outpatient Follow-up:  1. Follow up with PCP within one week 2. Complete course of oral Vancomycin 3. DC to SNF [facility]).  Discharge Diagnoses:  Active Problems:   Acute kidney injury (HCC)   Alcohol withdrawal (HCC)   Alcohol use disorder, moderate, in early remission, dependence (HCC) C diff colitis Hypokalemia  Discharge Condition: Stable  Diet recommendation: Cardiac diet  Filed Weights   01/17/16 0500 01/22/16 0000 01/22/16 0500  Weight: 76.6 kg (168 lb 14 oz) 74 kg (163 lb 2.3 oz) 74 kg (163 lb 2.3 oz)    History of present illness: 65 year old female with past medical history significant for alcohol dependence, depression, alcohol-induced seizures, cerebral palsy, Hypertension. Patient presented with alcohol abuse and related issues. Patient was involuntarily committed. Patient was reported to be drinking excessive alcohol, not eating, with associated nausea, vomiting, diarrhea, abdominal pain, and had not been out of bed for 2 weeks prior admission. Patient was found lying in her urine and feces. Her last alcohol drink was the morning of admission. Patient lives with her husband. On admission she was confused and was unable to provide reliable history. In the ED, she was hypotensive, tachycardic, hypoxic, sodium 131, creatinine 2.4, lipase 144.  Hospital Course: Patient was admitted for further assessment and management. Patient was started on alcohol withdrawal protocol. The withdrawal period has been uneventful. However, patient has continued to ask for Ativan PRN. During the hospital stay, patient developed diarrhea and stool work up was positive for C. Diff. Patient has been on oral vancomycin, and will complete 14 day course of oral vancomycin.  Patient's renal function and electrolytes were monitored and replete during the hospital stay. The AKI has resolved. Patient is optimized and will be discharged to SNF [facility]) today.  Procedures:  None  Consultations:  None  Discharge Exam: Filed Vitals:   01/22/16 2158 01/23/16 0525  BP: 135/77 129/77  Pulse: 89 88  Temp: 97.7 F (36.5 C) 99.8 F (37.7 C)  Resp: 19 18    General: Not in distress. AAO X 3. Cardiovascular: S1S2, ejection systolic murmur. Respiratory: clear to auscultation.  Discharge Instructions   Discharge Instructions    Diet - low sodium heart healthy    Complete by:  As directed      Increase activity slowly    Complete by:  As directed           Current Discharge Medication List    START taking these medications   Details  Amino Acids-Protein Hydrolys (FEEDING SUPPLEMENT, PRO-STAT SUGAR FREE 64,) LIQD Take 30 mLs by mouth daily. Qty: 900 mL, Refills: 0    gabapentin (NEURONTIN) 300 MG capsule Take 1 capsule (300 mg total) by mouth at bedtime. Qty: 30 capsule, Refills: 1    lidocaine (XYLOCAINE) 2 % solution Use as directed 15 mLs in the mouth or throat every 3 (three) hours as needed for mouth pain. Qty: 100 mL, Refills: 0    LORazepam (ATIVAN) 0.5 MG tablet Take 1 tablet (0.5 mg total) by mouth every 8 (eight) hours as needed for anxiety. Qty: 12 tablet, Refills: 0    Multiple Vitamin (MULTIVITAMIN WITH MINERALS) TABS tablet Take 1 tablet by mouth daily. Qty: 30 tablet, Refills: 1    vancomycin (VANCOCIN) 50 mg/mL oral solution Take 2.5 mLs (125 mg total)  by mouth 4 (four) times daily. Qty: 130 mL, Refills: 0      CONTINUE these medications which have CHANGED   Details  FLUoxetine (PROZAC) 20 MG capsule Take 1 capsule (20 mg total) by mouth daily. Qty: 30 capsule, Refills: 3      CONTINUE these medications which have NOT CHANGED   Details  aspirin EC 81 MG tablet Take 81 mg by mouth daily.      STOP taking these medications      amphetamine-dextroamphetamine (ADDERALL XR) 20 MG 24 hr capsule      losartan (COZAAR) 50 MG tablet        Allergies  Allergen Reactions  . Macrodantin [Nitrofurantoin] Nausea And Vomiting   Follow-up Information    Follow up In 1 week.      Follow up with [name], MD In 1 week.   Specialty:  Family Medicine   Contact information:   [address] [place] [address] [phone]        The results of significant diagnostics from this hospitalization (including imaging, microbiology, ancillary and laboratory) are listed below for reference.    Significant Diagnostic Studies: Ct Abdomen Pelvis Wo Contrast  01/16/2016  CLINICAL DATA:  Excessive drinking of alcohol, not getting out of bed. EXAM: CT ABDOMEN AND PELVIS WITHOUT CONTRAST TECHNIQUE: Multidetector CT imaging of the abdomen and pelvis was performed following the standard protocol without IV contrast. COMPARISON:  None. FINDINGS: Mild multilevel degenerative disc disease is noted in the lumbar spine. Mild bibasilar subsegmental atelectasis is noted. Fatty infiltration of the liver is noted. No definite gallstones are noted. The spleen and pancreas appear normal. Adrenal glands and kidneys appear normal. No hydronephrosis or renal obstruction is noted. No renal or ureteral calculi are noted. The appendix appears normal. Wall thickening of cecum is noted concerning for inflammation with surrounding inflammation. There is no evidence of bowel obstruction. Urinary bladder appears normal. Uterus and ovaries are unremarkable. No significant adenopathy is noted. IMPRESSION: Fatty infiltration of the liver. Wall thickening of cecum is noted concerning for inflammatory or infectious colitis. Electronically Signed   By: Lupita Raider, M.D.   On: 01/16/2016 13:54   Dg Chest 2 View  01/21/2016  CLINICAL DATA:  Shortness of breath and cough for 3 weeks EXAM: CHEST  2 VIEW COMPARISON:  January 16, 2016 FINDINGS: There is  mild bibasilar atelectasis. There is no edema or consolidation. Heart size and pulmonary vascularity are normal. No adenopathy. There is postoperative change in the lower cervical region. IMPRESSION: Patchy bibasilar atelectasis. No edema or consolidation. Stable cardiac silhouette. Electronically Signed   By: Bretta Bang III M.D.   On: 01/21/2016 11:58   Dg Ribs Unilateral W/chest Right  01/16/2016  CLINICAL DATA:  Bilateral rib pain without reported injury. EXAM: RIGHT RIBS AND CHEST - 3+ VIEW COMPARISON:  Radiographs of September 20, 2013. FINDINGS: No fracture or other bone lesions are seen involving the ribs. There is no evidence of pneumothorax or pleural effusion. Both lungs are clear. Heart size and mediastinal contours are within normal limits. IMPRESSION: Normal right ribs.  No acute cardiopulmonary abnormality seen. Electronically Signed   By: Lupita Raider, M.D.   On: 01/16/2016 11:45   US Renal  01/17/2016  CLINICAL DATA:  Acute kidney injury. EXAM: RENAL / URINARY TRACT ULTRASOUND COMPLETE COMPARISON:  CT of the abdomen and pelvis 01/16/2016. FINDINGS: Right Kidney: Length: 10.3 cm, within normal limits. Echogenicity within normal limits. No mass or hydronephrosis  visualized. Left Kidney: Length: 11.6 cm, within normal limits. Echogenicity within normal limits. No mass or hydronephrosis visualized. Bladder: A Foley catheter is present within the urinary bladder. There is some gas within the bladder is well. IMPRESSION: Normal sonographic appearance of the kidneys bilaterally. Electronically Signed   By: Marin Roberts M.D.   On: 01/17/2016 10:52    Microbiology: Recent Results (from the past 240 hour(s))  MRSA PCR Screening     Status: None   Collection Time: 01/16/16  1:57 PM  Result Value Ref Range Status   MRSA by PCR NEGATIVE NEGATIVE Final    Comment:        The GeneXpert MRSA Assay (FDA approved for NASAL specimens only), is one component of a comprehensive MRSA  colonization surveillance program. It is not intended to diagnose MRSA infection nor to guide or monitor treatment for MRSA infections.   C difficile quick scan w PCR reflex     Status: Abnormal   Collection Time: 01/17/16  1:59 PM  Result Value Ref Range Status   C Diff antigen POSITIVE (A) NEGATIVE Final    Comment: CRITICAL RESULT CALLED TO, READ BACK BY AND VERIFIED WITH: HABIB,I RN [id number] COVINGTON,N    C Diff toxin POSITIVE (A) NEGATIVE Final    Comment: CRITICAL RESULT CALLED TO, READ BACK BY AND VERIFIED WITH: HABIB,I RN [id number] COVINGTON,N    C Diff interpretation Toxin producing C. difficile detected.  Final    Comment: CRITICAL RESULT CALLED TO, READ BACK BY AND VERIFIED WITH: HABIB,I RN [id number] COVINGTON,N   Gastrointestinal Panel by PCR , Stool     Status: None   Collection Time: 01/17/16  1:59 PM  Result Value Ref Range Status   Campylobacter species NOT DETECTED NOT DETECTED Final   Plesimonas shigelloides NOT DETECTED NOT DETECTED Final   Salmonella species NOT DETECTED NOT DETECTED Final   Yersinia enterocolitica NOT DETECTED NOT DETECTED Final   Vibrio species NOT DETECTED NOT DETECTED Final   Vibrio cholerae NOT DETECTED NOT DETECTED Final   Enteroaggregative E coli (EAEC) NOT DETECTED NOT DETECTED Final   Enteropathogenic E coli (EPEC) NOT DETECTED NOT DETECTED Final   Enterotoxigenic E coli (ETEC) NOT DETECTED NOT DETECTED Final   Shiga like toxin producing E coli (STEC) NOT DETECTED NOT DETECTED Final   E. coli O157 NOT DETECTED NOT DETECTED Final   Shigella/Enteroinvasive E coli (EIEC) NOT DETECTED NOT DETECTED Final   Cryptosporidium NOT DETECTED NOT DETECTED Final   Cyclospora cayetanensis NOT DETECTED NOT DETECTED Final   Entamoeba histolytica NOT DETECTED NOT DETECTED Final   Giardia lamblia NOT DETECTED NOT DETECTED Final   Adenovirus F40/41 NOT DETECTED NOT DETECTED Final   Astrovirus NOT DETECTED NOT DETECTED Final   Norovirus  GI/GII NOT DETECTED NOT DETECTED Final   Rotavirus A NOT DETECTED NOT DETECTED Final   Sapovirus (I, II, IV, and V) NOT DETECTED NOT DETECTED Final     Labs: Basic Metabolic Panel:  Recent Labs Lab 01/16/16 1303 01/17/16 0331 01/18/16 0318 01/19/16 0329 01/20/16 0517 01/21/16 0513 01/22/16 0452 01/23/16 0546  NA 134* 137 139 139 139 138 140  --   K 4.3 4.0 3.4* 4.2 3.4* 3.6 3.4*  --   CL 103 106 109 110 108 104 105  --   CO2 10* 14* 32  --   GLUCOSE 72 136* 223* 173* 94 110* 92  --   BUN 51* 58* 38* --  CREATININE 2.19* 2.25* 1.04* 0.65 0.52 0.54 0.49 0.44  CALCIUM 6.7* 6.9* 7.8* 8.1* 8.1* 7.8* 7.7*  --   MG 2.0 2.5*  --   --  1.9  --   --   --    Liver Function Tests:  Recent Labs Lab 01/18/16 0318 01/19/16 0329 01/20/16 0517 01/21/16 0513 01/22/16 0452  AST 134* 170* 179* 102* 69*  ALT 89* 118* 119* 100* 76*  ALKPHOS 142* 153* 156* 164* 152*  BILITOT 0.9 0.7 0.7 0.4 0.7  PROT 5.3* 5.4* 4.9* 5.1* 4.6*  ALBUMIN 2.7* 2.7* 2.5* 2.5* 2.3*    Recent Labs Lab 01/17/16 0331  LIPASE 84*    Recent Labs Lab 01/16/16 1303  AMMONIA 25   CBC:  Recent Labs Lab 01/17/16 0331 01/18/16 0318 01/19/16 0329 01/20/16 0517 01/21/16 0513  WBC 8.8 8.6 8.5 8.8 6.9  HGB 10.6* 10.9* 11.5* 12.4 12.1  HCT 31.8* 32.0* 33.6* 36.3 35.8*  MCV 93.0 90.9 92.1 91.7 92.7  PLT 142* 146* 174 200 194   Cardiac Enzymes: No results for input(s): CKTOTAL, CKMB, CKMBINDEX, TROPONINI in the last 168 hours. BNP: BNP (last 3 results) No results for input(s): BNP in the last 8760 hours.  ProBNP (last 3 results) No results for input(s): PROBNP in the last 8760 hours.  CBG:  Recent Labs Lab 01/21/16 0706 01/21/16 1147 01/21/16 1639 01/21/16 2045 01/22/16 0708  GLUCAP 97 112* 101* 107* 78       Signed:  Berton Mount, MD  [facility] Pager #: [phone] 7PM-7AM contact night coverage as above

## 2016-01-25 ENCOUNTER — Other Ambulatory Visit: Payer: Self-pay

## 2016-01-25 MED ORDER — LORAZEPAM 0.5 MG PO TABS: 0.5000 mg | ORAL_TABLET | Freq: Three times a day (TID) | ORAL | 5 refills | Status: DC | PRN

## 2016-01-25 NOTE — Telephone Encounter (Signed)
Rx faxed to [facility] @ [phone], phone number [phone]  

## 2016-01-26 ENCOUNTER — Emergency Department (HOSPITAL_COMMUNITY): Payer: Managed Care, Other (non HMO)

## 2016-01-26 ENCOUNTER — Observation Stay (HOSPITAL_COMMUNITY)
Admission: EM | Admit: 2016-01-26 | Discharge: 2016-01-29 | Disposition: A | Discharge status: Home / Self Care | Payer: Managed Care, Other (non HMO) | Attending: Internal Medicine | Admitting: Internal Medicine

## 2016-01-26 ENCOUNTER — Observation Stay (HOSPITAL_BASED_OUTPATIENT_CLINIC_OR_DEPARTMENT_OTHER): Payer: Managed Care, Other (non HMO)

## 2016-01-26 ENCOUNTER — Encounter (HOSPITAL_COMMUNITY): Payer: Self-pay | Admitting: Radiology

## 2016-01-26 DIAGNOSIS — I10 Essential (primary) hypertension: Secondary | ICD-10-CM | POA: Diagnosis not present

## 2016-01-26 DIAGNOSIS — I4581 Long QT syndrome: Secondary | ICD-10-CM | POA: Diagnosis not present

## 2016-01-26 DIAGNOSIS — Z8719 Personal history of other diseases of the digestive system: Secondary | ICD-10-CM | POA: Insufficient documentation

## 2016-01-26 DIAGNOSIS — E876 Hypokalemia: Secondary | ICD-10-CM | POA: Diagnosis not present

## 2016-01-26 DIAGNOSIS — W19XXXA Unspecified fall, initial encounter: Secondary | ICD-10-CM | POA: Insufficient documentation

## 2016-01-26 DIAGNOSIS — F329 Major depressive disorder, single episode, unspecified: Secondary | ICD-10-CM | POA: Insufficient documentation

## 2016-01-26 DIAGNOSIS — S0003XA Contusion of scalp, initial encounter: Secondary | ICD-10-CM | POA: Diagnosis not present

## 2016-01-26 DIAGNOSIS — Z7982 Long term (current) use of aspirin: Secondary | ICD-10-CM | POA: Diagnosis not present

## 2016-01-26 DIAGNOSIS — R55 Syncope and collapse: Secondary | ICD-10-CM | POA: Diagnosis present

## 2016-01-26 DIAGNOSIS — R7989 Other specified abnormal findings of blood chemistry: Secondary | ICD-10-CM

## 2016-01-26 DIAGNOSIS — I872 Venous insufficiency (chronic) (peripheral): Secondary | ICD-10-CM | POA: Insufficient documentation

## 2016-01-26 DIAGNOSIS — E860 Dehydration: Secondary | ICD-10-CM | POA: Diagnosis not present

## 2016-01-26 DIAGNOSIS — G809 Cerebral palsy, unspecified: Secondary | ICD-10-CM | POA: Insufficient documentation

## 2016-01-26 DIAGNOSIS — R9431 Abnormal electrocardiogram [ECG] [EKG]: Secondary | ICD-10-CM

## 2016-01-26 DIAGNOSIS — N179 Acute kidney failure, unspecified: Secondary | ICD-10-CM | POA: Diagnosis not present

## 2016-01-26 DIAGNOSIS — R778 Other specified abnormalities of plasma proteins: Secondary | ICD-10-CM | POA: Diagnosis not present

## 2016-01-26 DIAGNOSIS — F1021 Alcohol dependence, in remission: Secondary | ICD-10-CM

## 2016-01-26 DIAGNOSIS — R609 Edema, unspecified: Secondary | ICD-10-CM | POA: Insufficient documentation

## 2016-01-26 DIAGNOSIS — K760 Fatty (change of) liver, not elsewhere classified: Secondary | ICD-10-CM | POA: Diagnosis not present

## 2016-01-26 DIAGNOSIS — Z8619 Personal history of other infectious and parasitic diseases: Secondary | ICD-10-CM

## 2016-01-26 LAB — TSH: TSH: 2.951 u[IU]/mL (ref 0.350–4.500)

## 2016-01-26 LAB — BASIC METABOLIC PANEL
Anion gap: 7 (ref 5–15)
BUN: 6 mg/dL (ref 6–20)
CHLORIDE: 111 mmol/L (ref 101–111)
CO2: 26 mmol/L (ref 22–32)
Calcium: 8.4 mg/dL — ABNORMAL LOW (ref 8.9–10.3)
Creatinine, Ser: 0.53 mg/dL (ref 0.44–1.00)
GFR calc Af Amer: >60 mL/min (ref >60)
GFR calc non Af Amer: >60 mL/min (ref >60)
GLUCOSE: 84 mg/dL (ref 65–99)
POTASSIUM: 3.1 mmol/L — AB (ref 3.5–5.1)
Sodium: 144 mmol/L (ref 135–145)

## 2016-01-26 LAB — PROTIME-INR
INR: 1.13 (ref 0.00–1.49)
PROTHROMBIN TIME: 14.2 s (ref 11.6–15.2)

## 2016-01-26 LAB — HEPATIC FUNCTION PANEL
ALK PHOS: 132 U/L — AB (ref 38–126)
ALT: 53 U/L (ref 14–54)
AST: 36 U/L (ref 15–41)
Albumin: 3.4 g/dL — ABNORMAL LOW (ref 3.5–5.0)
Bilirubin, Direct: <0.1 mg/dL — ABNORMAL LOW (ref 0.1–0.5)
TOTAL PROTEIN: 6.6 g/dL (ref 6.5–8.1)
Total Bilirubin: 0.8 mg/dL (ref 0.3–1.2)

## 2016-01-26 LAB — CBC
HEMATOCRIT: 31.6 % — AB (ref 36.0–46.0)
Hemoglobin: 10.5 g/dL — ABNORMAL LOW (ref 12.0–15.0)
MCH: 31.1 pg (ref 26.0–34.0)
MCHC: 33.2 g/dL (ref 30.0–36.0)
MCV: 93.5 fL (ref 78.0–100.0)
Platelets: 518 10*3/uL — ABNORMAL HIGH (ref 150–400)
RBC: 3.38 MIL/uL — ABNORMAL LOW (ref 3.87–5.11)
RDW: 17 % — AB (ref 11.5–15.5)
WBC: 7 10*3/uL (ref 4.0–10.5)

## 2016-01-26 LAB — MAGNESIUM: MAGNESIUM: 1.9 mg/dL (ref 1.7–2.4)

## 2016-01-26 LAB — URINALYSIS, ROUTINE W REFLEX MICROSCOPIC
BILIRUBIN URINE: NEGATIVE
Glucose, UA: NEGATIVE mg/dL
Ketones, ur: NEGATIVE mg/dL
Leukocytes, UA: NEGATIVE
NITRITE: NEGATIVE
PH: 7.5 (ref 5.0–8.0)
Protein, ur: NEGATIVE mg/dL
SPECIFIC GRAVITY, URINE: 1.011 (ref 1.005–1.030)

## 2016-01-26 LAB — URINE MICROSCOPIC-ADD ON

## 2016-01-26 LAB — ECHOCARDIOGRAM COMPLETE
Height: 65 in
Weight: 2547.2 oz

## 2016-01-26 LAB — CBG MONITORING, ED: Glucose-Capillary: 86 mg/dL (ref 65–99)

## 2016-01-26 LAB — TROPONIN I
TROPONIN I: 0.03 ng/mL — AB (ref <0.03)
Troponin I: 0.03 ng/mL (ref <0.03)
Troponin I: 0.03 ng/mL (ref <0.03)

## 2016-01-26 LAB — ETHANOL: Alcohol, Ethyl (B): <5 mg/dL (ref <5)

## 2016-01-26 MED ORDER — ADULT MULTIVITAMIN W/MINERALS CH
1.0000 | ORAL_TABLET | Freq: Every day | ORAL | Status: DC
  Administered 2016-01-26 – 2016-01-29 (×4): 1 via ORAL
  Filled 2016-01-26 (×4): qty 1

## 2016-01-26 MED ORDER — SODIUM CHLORIDE 0.9 % IV SOLN
INTRAVENOUS | Status: DC
  Administered 2016-01-26 – 2016-01-27 (×2): via INTRAVENOUS

## 2016-01-26 MED ORDER — ONDANSETRON HCL 4 MG PO TABS: 4.0000 mg | ORAL_TABLET | Freq: Four times a day (QID) | ORAL | Status: DC | PRN

## 2016-01-26 MED ORDER — POTASSIUM CHLORIDE CRYS ER 20 MEQ PO TBCR
60.0000 meq | EXTENDED_RELEASE_TABLET | Freq: Once | ORAL | Status: AC
  Administered 2016-01-26: 60 meq via ORAL
  Filled 2016-01-26: qty 3

## 2016-01-26 MED ORDER — VANCOMYCIN 50 MG/ML ORAL SOLUTION
125.0000 mg | Freq: Four times a day (QID) | ORAL | Status: DC
Start: 2016-01-26 — End: 2016-01-29
  Administered 2016-01-26 – 2016-01-29 (×12): 125 mg via ORAL
  Filled 2016-01-26 (×16): qty 2.5

## 2016-01-26 MED ORDER — ASPIRIN EC 81 MG PO TBEC
81.0000 mg | DELAYED_RELEASE_TABLET | Freq: Every day | ORAL | Status: DC
Start: 2016-01-26 — End: 2016-01-29
  Administered 2016-01-26 – 2016-01-29 (×4): 81 mg via ORAL
  Filled 2016-01-26 (×4): qty 1

## 2016-01-26 MED ORDER — HEPARIN SODIUM (PORCINE) 5000 UNIT/ML IJ SOLN
5000.0000 [IU] | Freq: Three times a day (TID) | INTRAMUSCULAR | Status: DC
  Administered 2016-01-26 – 2016-01-29 (×9): 5000 [IU] via SUBCUTANEOUS
  Filled 2016-01-26 (×9): qty 1

## 2016-01-26 MED ORDER — HYDROCODONE-ACETAMINOPHEN 5-325 MG PO TABS: 1.0000 | ORAL_TABLET | ORAL | Status: DC | PRN

## 2016-01-26 MED ORDER — MAGNESIUM SULFATE 2 GM/50ML IV SOLN
2.0000 g | Freq: Once | INTRAVENOUS | Status: AC
  Administered 2016-01-26: 2 g via INTRAVENOUS
  Filled 2016-01-26: qty 50

## 2016-01-26 MED ORDER — SACCHAROMYCES BOULARDII 250 MG PO CAPS
250.0000 mg | ORAL_CAPSULE | Freq: Two times a day (BID) | ORAL | Status: DC
  Administered 2016-01-26 – 2016-01-29 (×6): 250 mg via ORAL
  Filled 2016-01-26 (×7): qty 1

## 2016-01-26 MED ORDER — POTASSIUM CHLORIDE CRYS ER 20 MEQ PO TBCR
40.0000 meq | EXTENDED_RELEASE_TABLET | Freq: Once | ORAL | Status: AC
  Administered 2016-01-26: 40 meq via ORAL
  Filled 2016-01-26: qty 2

## 2016-01-26 MED ORDER — ACETAMINOPHEN 650 MG RE SUPP: 650.0000 mg | Freq: Four times a day (QID) | RECTAL | Status: DC | PRN

## 2016-01-26 MED ORDER — ACETAMINOPHEN 325 MG PO TABS
650.0000 mg | ORAL_TABLET | Freq: Four times a day (QID) | ORAL | Status: DC | PRN
Start: 2016-01-26 — End: 2016-01-29
  Administered 2016-01-27 – 2016-01-29 (×4): 650 mg via ORAL
  Filled 2016-01-26 (×4): qty 2

## 2016-01-26 MED ORDER — SODIUM CHLORIDE 0.9% FLUSH
3.0000 mL | Freq: Two times a day (BID) | INTRAVENOUS | Status: DC
  Administered 2016-01-27 – 2016-01-29 (×5): 3 mL via INTRAVENOUS

## 2016-01-26 MED ORDER — MORPHINE SULFATE (PF) 2 MG/ML IV SOLN: 1.0000 mg | INTRAVENOUS | Status: DC | PRN

## 2016-01-26 MED ORDER — GABAPENTIN 300 MG PO CAPS
300.0000 mg | ORAL_CAPSULE | Freq: Every day | ORAL | Status: DC
  Administered 2016-01-26: 300 mg via ORAL
  Filled 2016-01-26: qty 1

## 2016-01-26 MED ORDER — PRO-STAT SUGAR FREE PO LIQD
30.0000 mL | Freq: Every day | ORAL | Status: DC
  Administered 2016-01-26 – 2016-01-29 (×2): 30 mL via ORAL
  Filled 2016-01-26 (×3): qty 30

## 2016-01-26 MED ORDER — LORAZEPAM 0.5 MG PO TABS: 0.5000 mg | ORAL_TABLET | Freq: Three times a day (TID) | ORAL | Status: DC | PRN

## 2016-01-26 MED ORDER — ONDANSETRON HCL 4 MG/2ML IJ SOLN: 4.0000 mg | Freq: Four times a day (QID) | INTRAMUSCULAR | Status: DC | PRN

## 2016-01-26 MED ORDER — PROCHLORPERAZINE EDISYLATE 5 MG/ML IJ SOLN: 5.0000 mg | Freq: Four times a day (QID) | INTRAMUSCULAR | Status: DC | PRN

## 2016-01-26 NOTE — ED Triage Notes (Signed)
Arrives per EMS with report of Ashley Bradley seizure at [employer]. Patient was postictal  when EMS arrived. CBG of 137, noted small hematoma to left side of head. Patient A/o X 4.

## 2016-01-26 NOTE — ED Provider Notes (Signed)
[facility]-EMERGENCY DEPT Provider Note   CSN: [id number] Arrival date & time: 01/26/16  0350  First Provider Contact:  [phone]    History   Chief Complaint Chief Complaint  Patient presents with  . Loss of Consciousness    Seizure?    HPI Ashley Bradley is a 65 y.o. female.  HPI  Pt was seen at 0835. Per EMS, NH report, and pt: c/o sudden onset and resolution of one episode of syncope and "grad mal seizure," that occurred this morning. EMS states pt was in the bathroom and post-ictal on their arrival. Pt states she "doesn't really remember anything" except "waking up on the floor and EMS was talking to me." Pt herself denies confusion while talking with EMS (other than not recalling events). States her LD etoh was "weeks ago" Pt was discharged from the hospital 2 days ago after a 1+ week stay for heavy etoh use and is currently being tx for cdiff colitis with vancomycin. States she "I hit my head when I fell." Denies lightheadedness, no neck or back pain, no focal motor weakness, no tingling/numbness in extremities, no CP/SOB, no abd pain, no N/V/D.     Past Medical History:  Diagnosis Date  . Alcoholism (HCC)   . Complication of anesthesia    hard time waking up  . CP (cerebral palsy) (HCC)   . Depression   . Hypertension   . PONV (postoperative nausea and vomiting)   . Seizures (HCC)    ETOH induced    Patient Active Problem List   Diagnosis Date Noted  . Alcohol use disorder, moderate, in early remission, dependence (HCC) 01/21/2016  . Acute kidney injury (HCC) 01/16/2016  . Alcohol withdrawal (HCC) 01/16/2016  . Alcohol related seizure (HCC) 09/24/2013  . UTI (urinary tract infection) 09/24/2013  . Hyponatremia 09/20/2013  . Major depression (HCC) 03/19/2013  . Alcohol intoxication in relapsed alcoholic (HCC) 03/19/2013    Past Surgical History:  Procedure Laterality Date  . BLADDER SURGERY    . BREAST SURGERY    . TUBAL LIGATION         Home Medications     Prior to Admission medications   Medication Sig Start Date End Date Taking? Authorizing Provider  Amino Acids-Protein Hydrolys (FEEDING SUPPLEMENT, PRO-STAT SUGAR FREE 64,) LIQD Take 30 mLs by mouth daily. 01/23/16  Yes Barnetta Chapel, MD  aspirin EC 81 MG tablet Take 81 mg by mouth daily.   Yes Historical Provider, MD  FLUoxetine (PROZAC) 20 MG capsule Take 1 capsule (20 mg total) by mouth daily. 01/23/16  Yes Barnetta Chapel, MD  gabapentin (NEURONTIN) 300 MG capsule Take 1 capsule (300 mg total) by mouth at bedtime. 01/23/16  Yes Barnetta Chapel, MD  lidocaine (XYLOCAINE) 2 % solution Use as directed 15 mLs in the mouth or throat every 3 (three) hours as needed for mouth pain. 01/23/16  Yes Barnetta Chapel, MD  LORazepam (ATIVAN) 0.5 MG tablet Take 1 tablet (0.5 mg total) by mouth every 8 (eight) hours as needed for anxiety. 01/25/16  Yes Sharon Seller, NP  Multiple Vitamin (MULTIVITAMIN WITH MINERALS) TABS tablet Take 1 tablet by mouth daily. 01/23/16  Yes Barnetta Chapel, MD  vancomycin (VANCOCIN) 50 mg/mL oral solution Take 2.5 mLs (125 mg total) by mouth 4 (four) times daily. 01/23/16 02/04/16 Yes Barnetta Chapel, MD    Family History   Social History Social History  Substance Use Topics  . Smoking status: Never Smoker  .  Smokeless tobacco: Never Used  . Alcohol use Yes     Comment: 1 bottle wine per day x 2 weeks - 09/20/13     Allergies   Macrodantin [nitrofurantoin]   Review of Systems Review of Systems ROS: Statement: All systems negative except as marked or noted in the HPI; Constitutional: Negative for fever and chills. ; ; Eyes: Negative for eye pain, redness and discharge. ; ; ENMT: Negative for ear pain, hoarseness, nasal congestion, sinus pressure and sore throat. ; ; Cardiovascular: Negative for chest pain, palpitations, diaphoresis, dyspnea and peripheral edema. ; ; Respiratory: Negative for cough, wheezing and stridor. ; ; Gastrointestinal: Negative  for nausea, vomiting, diarrhea, abdominal pain, blood in stool, hematemesis, jaundice and rectal bleeding. . ; ; Genitourinary: Negative for dysuria, flank pain and hematuria. ; ; Musculoskeletal: +head injury. Negative for back pain and neck pain. Negative for swelling and deformity.; ; Skin: Negative for pruritus, rash, abrasions, blisters, bruising and skin lesion.; ; Neuro: Negative for lightheadedness and neck stiffness. Negative for weakness, altered mental status, extremity weakness, paresthesias, involuntary movement, +seizure and syncope.      Physical Exam Updated Vital Signs BP 134/75 (BP Location: Right Arm)   Pulse 82   Temp 98.7 F (37.1 C) (Oral)   Resp 16   SpO2 100%    09:31 Orthostatic Vital Signs LD  Orthostatic Lying   BP- Lying:  137/114  Pulse- Lying: 86      Orthostatic Sitting  BP- Sitting: 151/80  Pulse- Sitting: 98      Orthostatic Standing at 0 minutes  BP- Standing at 0 minutes: 142/82  Pulse- Standing at 0 minutes: 107       Physical Exam 0835: Physical examination:  Nursing notes reviewed; Vital signs and O2 SAT reviewed;  Constitutional: Well developed, Well nourished, Well hydrated, In no acute distress; Head:  Normocephalic, +left scalp hematoma.; Eyes: EOMI, PERRL, No scleral icterus; ENMT: Mouth and pharynx normal, Mucous membranes moist; Neck: Supple, Full range of motion, No lymphadenopathy; Cardiovascular: Regular rate and rhythm, No gallop; Respiratory: Breath sounds clear & equal bilaterally, No wheezes. Speaking full sentences with ease, Normal respiratory effort/excursion; Chest: Nontender, Movement normal; Abdomen: Soft, Nontender, Nondistended, Normal bowel sounds; Genitourinary: No CVA tenderness; Spine:  No midline CS, TS, LS tenderness.;; Extremities: Pulses normal, No tenderness, No edema, No calf edema or asymmetry.; Neuro: AA&Ox3, vague historian. Major CN grossly intact. No facial droop. Speech clear. Grips equal. No gross focal  motor or sensory deficits in extremities.; Skin: Color normal, Warm, Dry.    ED Treatments / Results  Labs (all labs ordered are listed, but only abnormal results are displayed)   EKG  EKG Interpretation  Date/Time:  Tuesday January 26 2016 09:30:09 EDT Ventricular Rate:  86 PR Interval:    QRS Duration: 84 QT Interval:  420 QTC Calculation: 503 R Axis:   54 Text Interpretation:  Sinus rhythm Abnrm T, consider ischemia, anterolateral lds Prolonged QT interval Artifact When compared with ECG of 01/19/2016 QT has lengthened Otherwise no significant change Confirmed by [facility]  MD, Nicholos Johns [phone]) on 01/26/2016 10:11:35 AM       Radiology   Procedures Procedures (including critical care time)  Medications Ordered in ED Medications - No data to display   Initial Impression / Assessment and Plan / ED Course  I have reviewed the triage vital signs and the nursing notes.  Pertinent labs & imaging results that were available during my care of the patient were reviewed by me  and considered in my medical decision making (see chart for details).  MDM Reviewed: previous chart, nursing note and vitals Reviewed previous: labs and ECG Interpretation: labs, ECG, x-ray and CT scan    Results for orders placed or performed during the hospital encounter of 01/26/16  Basic metabolic panel - if new onset seizures  Result Value Ref Range   Sodium 144 135 - 145 mmol/L   Potassium 3.1 (L) 3.5 - 5.1 mmol/L   Chloride 111 101 - 111 mmol/L   CO2 26 22 - 32 mmol/L   Glucose, Bld 84 65 - 99 mg/dL   BUN 6 6 - 20 mg/dL   Creatinine, Ser 1.61 0.44 - 1.00 mg/dL   Calcium 8.4 (L) 8.9 - 10.3 mg/dL   GFR calc non Af Amer >60 >60 mL/min   GFR calc Af Amer >60 >60 mL/min   Anion gap 7 5 - 15  CBC - if new onset seizures  Result Value Ref Range   WBC 7.0 4.0 - 10.5 K/uL   RBC 3.38 (L) 3.87 - 5.11 MIL/uL   Hemoglobin 10.5 (L) 12.0 - 15.0 g/dL   HCT 09.6 (L) 04.5 - 40.9 %   MCV 93.5 78.0 - 100.0  fL   MCH 31.1 26.0 - 34.0 pg   MCHC 33.2 30.0 - 36.0 g/dL   RDW 81.1 (H) 91.4 - 78.2 %   Platelets 518 (H) 150 - 400 K/uL  Troponin I  Result Value Ref Range   Troponin I 0.03 (HH) <0.03 ng/mL  Urinalysis, Routine w reflex microscopic  Result Value Ref Range   Color, Urine YELLOW YELLOW   APPearance CLEAR CLEAR   Specific Gravity, Urine 1.011 1.005 - 1.030   pH 7.5 5.0 - 8.0   Glucose, UA NEGATIVE NEGATIVE mg/dL   Hgb urine dipstick TRACE (A) NEGATIVE   Bilirubin Urine NEGATIVE NEGATIVE   Ketones, ur NEGATIVE NEGATIVE mg/dL   Protein, ur NEGATIVE NEGATIVE mg/dL   Nitrite NEGATIVE NEGATIVE   Leukocytes, UA NEGATIVE NEGATIVE  Hepatic function panel  Result Value Ref Range   Total Protein 6.6 6.5 - 8.1 g/dL   Albumin 3.4 (L) 3.5 - 5.0 g/dL   AST 36 15 - 41 U/L   ALT 53 14 - 54 U/L   Alkaline Phosphatase 132 (H) 38 - 126 U/L   Total Bilirubin 0.8 0.3 - 1.2 mg/dL   Bilirubin, Direct <9.5 (L) 0.1 - 0.5 mg/dL   Indirect Bilirubin NOT CALCULATED 0.3 - 0.9 mg/dL  Urine microscopic-add on  Result Value Ref Range   Squamous Epithelial / LPF 6-30 (A) NONE SEEN   WBC, UA 0-5 0 - 5 WBC/hpf   RBC / HPF 0-5 0 - 5 RBC/hpf   Bacteria, UA RARE (A) NONE SEEN  CBG monitoring, ED  Result Value Ref Range   Glucose-Capillary 86 65 - 99 mg/dL   Dg Chest 2 View Result Date: 01/26/2016 CLINICAL DATA:  Syncope.  Possible seizure this morning. EXAM: CHEST  2 VIEW COMPARISON:  PA and lateral chest 01/21/2016 and 01/31/2010. FINDINGS: The lungs are clear. Heart size is upper normal. No pneumothorax or pleural effusion. No focal bony abnormality. IMPRESSION: No acute disease. Electronically Signed   By: Drusilla Kanner M.D.   On: 01/26/2016 09:20  Ct Head Wo Contrast Result Date: 01/26/2016 CLINICAL DATA:  Grand mal seizure with apparent fall EXAM: CT HEAD WITHOUT CONTRAST TECHNIQUE: Contiguous axial images were obtained from the base of the skull through the vertex without intravenous contrast.  COMPARISON:  September 20, 2013 FINDINGS: Brain: Mild atrophy in a generalized manner remain stable. There is marginally greater atrophy in the frontal regions than elsewhere, stable. There is no intracranial mass, hemorrhage, extra-axial fluid collection, or midline shift. There is basal ganglia calcification bilaterally, felt to be physiologic, stable. There is minimal periventricular small vessel disease bilaterally. Elsewhere gray-white compartments appear normal. No acute infarct evident. Vascular: No hyperdense vessels are evident. There is no appreciable abnormal vascular calcification. Skull: The bony calvarium appears intact. There is a left parietal scalp hematoma near the vertex. Sinuses/Orbits: Visualized paranasal sinuses are clear. Visualized orbits appear symmetric bilaterally. Other: Mastoid air cells are clear. IMPRESSION: Left parietal scalp hematoma. No fracture evident. Mild atrophy with minimal periventricular small vessel disease. No intracranial mass, hemorrhage, or extra-axial fluid collection. No acute infarct evident. Basal ganglia calcification, stable and likely physiologic. Electronically Signed   By: Bretta Bang III M.D.   On: 01/26/2016 09:29    1200:  Potassium repleted PO. Not orthostatic on VS. Troponin elevated; will need trend. Will observation admit. Dx and testing d/w pt and family.  Questions answered.  Verb understanding, agreeable to admit. T/C to [facility]. Arthor Captain, case discussed, including:  HPI, pertinent PM/SHx, VS/PE, dx testing, ED course and treatment:  Agreeable to admit, requests to write temporary orders, obtain observation tele bed to team [facility].      Final Clinical Impressions(s) / ED Diagnoses   Final diagnoses:  Syncope, unspecified syncope type  Elevated troponin  Prolonged Q-T interval on ECG    New Prescriptions New Prescriptions   No medications on file     Samuel Jester, DO 01/27/16 0800

## 2016-01-26 NOTE — ED Notes (Signed)
Bed: TK16 Expected date:  Expected time:  Means of arrival:  Comments: Seizure from rehab

## 2016-01-26 NOTE — Progress Notes (Signed)
  Echocardiogram 2D Echocardiogram has been performed.  Arvil Chaco 01/26/2016, 4:28 PM

## 2016-01-26 NOTE — H&P (Signed)
History and Physical    Ashley Bradley ZOX:[id number] DOB: 1951/03/12 DOA: 01/26/2016  PCP: [name], MD  Patient coming from: SNF seen with her daughter and husband at bedside  Chief Complaint: Unwitnessed fall  HPI: Ashley Bradley is a 65 y.o. female with medical history significant of alcoholism, depression and recently diagnosed C. difficile colitis. Patient was in the hospital and discharged on 01/23/2016 to [facility]. Patient brought back because of unwitnessed fall and question of seizure. According to the patient and her daughter (who was a CNA) they both reported she got up about 3 in the morning to go to the bathroom and then she does not remember anything until the EMT arrived and talked to her. Patient reported she was very elevated was not confused when the EMT came. Brought to the hospital for further evaluation.  ED Course:  Vitals: Stable Labs: WNL except the potassium was 3.1 Imaging: CT of the head without contrast no acute issues except the left parietal scalp hematoma. Interventions: Seizures precautions  Review of Systems:  Constitutional: Fall Eyes: negative for irritation, redness and visual disturbance Ears, nose, mouth, throat, and face: negative for earaches, epistaxis, nasal congestion and sore throat Respiratory: negative for cough, dyspnea on exertion, sputum and wheezing Cardiovascular: negative for chest pain, dyspnea, lower extremity edema, orthopnea, palpitations and syncope Gastrointestinal: negative for abdominal pain, constipation, diarrhea, melena, nausea and vomiting Genitourinary:negative for dysuria, frequency and hematuria Hematologic/lymphatic: negative for bleeding, easy bruising and lymphadenopathy Musculoskeletal:negative for arthralgias, muscle weakness and stiff joints Neurological: negative for coordination problems, gait problems, headaches and weakness Endocrine: negative for diabetic symptoms including polydipsia, polyuria and  weight loss Allergic/Immunologic: negative for anaphylaxis, hay fever and urticaria  Past Medical History:  Diagnosis Date  . Alcoholism (HCC)   . Complication of anesthesia    hard time waking up  . CP (cerebral palsy) (HCC)   . Depression   . Hypertension   . PONV (postoperative nausea and vomiting)   . Seizures (HCC)    ETOH induced    Past Surgical History:  Procedure Laterality Date  . BLADDER SURGERY    . BREAST SURGERY    . TUBAL LIGATION       reports that she has never smoked. She has never used smokeless tobacco. She reports that she drinks alcohol. She reports that she does not use drugs.  Allergies  Allergen Reactions  . Macrodantin [Nitrofurantoin] Nausea And Vomiting   Family history: No contributory family history.  Prior to Admission medications   Medication Sig Start Date End Date Taking? Authorizing Provider  Amino Acids-Protein Hydrolys (FEEDING SUPPLEMENT, PRO-STAT SUGAR FREE 64,) LIQD Take 30 mLs by mouth daily. 01/23/16  Yes Barnetta Chapel, MD  aspirin EC 81 MG tablet Take 81 mg by mouth daily.   Yes Historical Provider, MD  FLUoxetine (PROZAC) 20 MG capsule Take 1 capsule (20 mg total) by mouth daily. 01/23/16  Yes Barnetta Chapel, MD  gabapentin (NEURONTIN) 300 MG capsule Take 1 capsule (300 mg total) by mouth at bedtime. 01/23/16  Yes Barnetta Chapel, MD  lidocaine (XYLOCAINE) 2 % solution Use as directed 15 mLs in the mouth or throat every 3 (three) hours as needed for mouth pain. 01/23/16  Yes Barnetta Chapel, MD  LORazepam (ATIVAN) 0.5 MG tablet Take 1 tablet (0.5 mg total) by mouth every 8 (eight) hours as needed for anxiety. 01/25/16  Yes Sharon Seller, NP  Multiple Vitamin (MULTIVITAMIN WITH MINERALS) TABS tablet Take  1 tablet by mouth daily. 01/23/16  Yes Barnetta Chapel, MD  vancomycin (VANCOCIN) 50 mg/mL oral solution Take 2.5 mLs (125 mg total) by mouth 4 (four) times daily. 01/23/16 02/04/16 Yes Barnetta Chapel, MD     Physical Exam:  Vitals:   01/26/16 0400 01/26/16 0406 01/26/16 0719 01/26/16 0941  BP: 153/88  134/75 151/78  Pulse: 89  82 94  Resp: Temp: 98.6 F (37 C)  98.7 F (37.1 C) 98.7 F (37.1 C)  TempSrc: Oral  Oral Oral  SpO2: 95% 99% 100% 100%    Constitutional: NAD, calm, comfortable Eyes: PERRL, lids and conjunctivae normal ENMT: Mucous membranes are moist. Posterior pharynx clear of any exudate or lesions.Normal dentition.  Neck: normal, supple, no masses, no thyromegaly Respiratory: clear to auscultation bilaterally, no wheezing, no crackles. Normal respiratory effort. No accessory muscle use.  Cardiovascular: Regular rate and rhythm, no murmurs / rubs / gallops. No extremity edema. 2+ pedal pulses. No carotid bruits.  Abdomen: no tenderness, no masses palpated. No hepatosplenomegaly. Bowel sounds positive.  Musculoskeletal: no clubbing / cyanosis. No joint deformity upper and lower extremities. Good ROM, no contractures. Normal muscle tone.  Skin: no rashes, lesions, ulcers. No induration Neurologic: CN 2-12 grossly intact. Sensation intact, DTR normal. Strength 5/5 in all 4.  Psychiatric: Normal judgment and insight. Alert and oriented x 3. Normal mood.   Labs on Admission: I have personally reviewed following labs and imaging studies  CBC:  Recent Labs Lab 01/20/16 0517 01/21/16 0513 01/26/16 0700  WBC 8.8 6.9 7.0  HGB 12.4 12.1 10.5*  HCT 36.3 35.8* 31.6*  MCV 91.7 92.7 93.5  PLT 200 194 518*   Basic Metabolic Panel:  Recent Labs Lab 01/20/16 0517 01/21/16 0513 01/22/16 0452 01/23/16 0546 01/26/16 0700 01/26/16 0940  NA 139 138 140  --  144  --   K 3.4* 3.6 3.4*  --  3.1*  --   CL 108 104 105  --  111  --   CO2 26 30 32  --  26  --   GLUCOSE 94 110* 92  --  84  --   BUN --  6  --   CREATININE 0.52 0.54 0.49 0.44 0.53  --   CALCIUM 8.1* 7.8* 7.7*  --  8.4*  --   MG 1.9  --   --   --   --  1.9   GFR: Estimated Creatinine  Clearance: 69.7 mL/min (by C-G formula based on SCr of 0.8 mg/dL). Liver Function Tests:  Recent Labs Lab 01/20/16 0517 01/21/16 0513 01/22/16 0452 01/26/16 0935  AST 179* 102* 69* 36  ALT 119* 100* 76* 53  ALKPHOS 156* 164* 152* 132*  BILITOT 0.7 0.4 0.7 0.8  PROT 4.9* 5.1* 4.6* 6.6  ALBUMIN 2.5* 2.5* 2.3* 3.4*   No results for input(s): LIPASE, AMYLASE in the last 168 hours. No results for input(s): AMMONIA in the last 168 hours. Coagulation Profile: No results for input(s): INR, PROTIME in the last 168 hours. Cardiac Enzymes:  Recent Labs Lab 01/26/16 0935  TROPONINI 0.03*   BNP (last 3 results) No results for input(s): PROBNP in the last 8760 hours. HbA1C: No results for input(s): HGBA1C in the last 72 hours. CBG:  Recent Labs Lab 01/21/16 1147 01/21/16 1639 01/21/16 2045 01/22/16 0708 01/26/16 0604  GLUCAP 112* 101* 107* 78 86   Lipid Profile: No results for input(s): CHOL, HDL, LDLCALC, TRIG, CHOLHDL,  LDLDIRECT in the last 72 hours. Thyroid Function Tests: No results for input(s): TSH, T4TOTAL, FREET4, T3FREE, THYROIDAB in the last 72 hours. Anemia Panel: No results for input(s): VITAMINB12, FOLATE, FERRITIN, TIBC, IRON, RETICCTPCT in the last 72 hours. Urine analysis:    Component Value Date/Time   COLORURINE YELLOW 01/26/2016 1045   APPEARANCEUR CLEAR 01/26/2016 1045   LABSPEC 1.011 01/26/2016 1045   PHURINE 7.5 01/26/2016 1045   GLUCOSEU NEGATIVE 01/26/2016 1045   HGBUR TRACE (A) 01/26/2016 1045   BILIRUBINUR NEGATIVE 01/26/2016 1045   KETONESUR NEGATIVE 01/26/2016 1045   PROTEINUR NEGATIVE 01/26/2016 1045   UROBILINOGEN 1.0 09/23/2013 0732   NITRITE NEGATIVE 01/26/2016 1045   LEUKOCYTESUR NEGATIVE 01/26/2016 1045   Sepsis Labs: !!!!!!!!!!!!!!!!!!!!!!!!!!!!!!!!!!!!!!!!!!!! Invalid input(s): PROCALCITONIN, LACTICIDVEN Recent Results (from the past 240 hour(s))  MRSA PCR Screening     Status: None   Collection Time: 01/16/16  1:57 PM  Result  Value Ref Range Status   MRSA by PCR NEGATIVE NEGATIVE Final    Comment:        The GeneXpert MRSA Assay (FDA approved for NASAL specimens only), is one component of a comprehensive MRSA colonization surveillance program. It is not intended to diagnose MRSA infection nor to guide or monitor treatment for MRSA infections.   C difficile quick scan w PCR reflex     Status: Abnormal   Collection Time: 01/17/16  1:59 PM  Result Value Ref Range Status   C Diff antigen POSITIVE (A) NEGATIVE Final    Comment: CRITICAL RESULT CALLED TO, READ BACK BY AND VERIFIED WITH: HABIB,I RN [id number] COVINGTON,N    C Diff toxin POSITIVE (A) NEGATIVE Final    Comment: CRITICAL RESULT CALLED TO, READ BACK BY AND VERIFIED WITH: HABIB,I RN [id number] COVINGTON,N    C Diff interpretation Toxin producing C. difficile detected.  Final    Comment: CRITICAL RESULT CALLED TO, READ BACK BY AND VERIFIED WITH: HABIB,I RN [id number] COVINGTON,N   Gastrointestinal Panel by PCR , Stool     Status: None   Collection Time: 01/17/16  1:59 PM  Result Value Ref Range Status   Campylobacter species NOT DETECTED NOT DETECTED Final   Plesimonas shigelloides NOT DETECTED NOT DETECTED Final   Salmonella species NOT DETECTED NOT DETECTED Final   Yersinia enterocolitica NOT DETECTED NOT DETECTED Final   Vibrio species NOT DETECTED NOT DETECTED Final   Vibrio cholerae NOT DETECTED NOT DETECTED Final   Enteroaggregative E coli (EAEC) NOT DETECTED NOT DETECTED Final   Enteropathogenic E coli (EPEC) NOT DETECTED NOT DETECTED Final   Enterotoxigenic E coli (ETEC) NOT DETECTED NOT DETECTED Final   Shiga like toxin producing E coli (STEC) NOT DETECTED NOT DETECTED Final   E. coli O157 NOT DETECTED NOT DETECTED Final   Shigella/Enteroinvasive E coli (EIEC) NOT DETECTED NOT DETECTED Final   Cryptosporidium NOT DETECTED NOT DETECTED Final   Cyclospora cayetanensis NOT DETECTED NOT DETECTED Final   Entamoeba histolytica NOT  DETECTED NOT DETECTED Final   Giardia lamblia NOT DETECTED NOT DETECTED Final   Adenovirus F40/41 NOT DETECTED NOT DETECTED Final   Astrovirus NOT DETECTED NOT DETECTED Final   Norovirus GI/GII NOT DETECTED NOT DETECTED Final   Rotavirus A NOT DETECTED NOT DETECTED Final   Sapovirus (I, II, IV, and V) NOT DETECTED NOT DETECTED Final     Radiological Exams on Admission: Dg Chest 2 View  Result Date: 01/26/2016 CLINICAL DATA:  Syncope.  Possible seizure this morning. EXAM: CHEST  2 VIEW  COMPARISON:  PA and lateral chest 01/21/2016 and 01/31/2010. FINDINGS: The lungs are clear. Heart size is upper normal. No pneumothorax or pleural effusion. No focal bony abnormality. IMPRESSION: No acute disease. Electronically Signed   By: Drusilla Kanner M.D.   On: 01/26/2016 09:20  Ct Head Wo Contrast  Result Date: 01/26/2016 CLINICAL DATA:  Grand mal seizure with apparent fall EXAM: CT HEAD WITHOUT CONTRAST TECHNIQUE: Contiguous axial images were obtained from the base of the skull through the vertex without intravenous contrast. COMPARISON:  September 20, 2013 FINDINGS: Brain: Mild atrophy in a generalized manner remain stable. There is marginally greater atrophy in the frontal regions than elsewhere, stable. There is no intracranial mass, hemorrhage, extra-axial fluid collection, or midline shift. There is basal ganglia calcification bilaterally, felt to be physiologic, stable. There is minimal periventricular small vessel disease bilaterally. Elsewhere gray-white compartments appear normal. No acute infarct evident. Vascular: No hyperdense vessels are evident. There is no appreciable abnormal vascular calcification. Skull: The bony calvarium appears intact. There is a left parietal scalp hematoma near the vertex. Sinuses/Orbits: Visualized paranasal sinuses are clear. Visualized orbits appear symmetric bilaterally. Other: Mastoid air cells are clear. IMPRESSION: Left parietal scalp hematoma. No fracture evident.  Mild atrophy with minimal periventricular small vessel disease. No intracranial mass, hemorrhage, or extra-axial fluid collection. No acute infarct evident. Basal ganglia calcification, stable and likely physiologic. Electronically Signed   By: Bretta Bang III M.D.   On: 01/26/2016 09:29   EKG: Independently reviewed.   Assessment/Plan Principal Problem:   Syncope Active Problems:   Acute kidney injury (HCC)   Prolonged Q-T interval on ECG   Hypokalemia   History of Clostridium difficile colitis    Syncope -Has unwitnessed fall likely secondary to syncope, has hematoma near her vertex. -CT scan of the head only showed a scalp hematoma no other acute abnormalities. -Likely multifactorial, dehydration from diarrhea and hypokalemia but cannot rule out prolonged QT as a cause. -Hydrate with IV fluids, check orthostatic blood pressure and replete potassium and magnesium.  Questionable seizure -Patient likely did not have seizure as she mentioned she was not confused after the event. -This is could be seizure-like activity from the syncopal episode, has history of alcohol withdrawal seizures. -Check EEG and seizure precautions, I do not think she will need antiepileptic for now.  Prolonged QT interval -QTC is 503, last time she came in to the hospital also she had QTC of 500. -Hold lidocaine mouthwash, fluoxetine and Zofran, correct potassium and keep mag >2.0 and check EKG in a.m. -She might need event monitor or even cardiology consultation if this is did corrected prior to discharge.  Hypokalemia -This is likely secondary to the diarrhea from C. difficile colitis, replete with oral supplements.  Recent history of C. difficile colitis -Continue the oral vancomycin, target 10-14 days, admitted Florastor.  History of alcohol abuse -Patient was in the hospital and discharge and 7/22 [facility] nursing home, alcohol level is undetectable.   DVT prophylaxis: SQ Heparin Code  Status: Prior Family Communication: Plan D/W patient Disposition Plan: Home Consults called:  Admission status: Tele/Obs   [facility] MD [facility] Pager [phone]  If 7PM-7AM, please contact night-coverage [email or web address] Password [facility]  01/26/2016, 1:43 PM

## 2016-01-26 NOTE — ED Notes (Signed)
Off floor for testing 

## 2016-01-26 NOTE — ED Notes (Signed)
RN starting IV 

## 2016-01-26 NOTE — ED Notes (Signed)
Pt. Is aware we need to collect urine sample. Pt is eating ice chips.

## 2016-01-27 ENCOUNTER — Encounter (HOSPITAL_COMMUNITY): Payer: Self-pay | Admitting: Nurse Practitioner

## 2016-01-27 ENCOUNTER — Observation Stay (HOSPITAL_BASED_OUTPATIENT_CLINIC_OR_DEPARTMENT_OTHER)
Admit: 2016-01-27 | Discharge: 2016-01-27 | Disposition: A | Discharge status: Home / Self Care | Payer: Managed Care, Other (non HMO) | Attending: Internal Medicine | Admitting: Internal Medicine

## 2016-01-27 DIAGNOSIS — R569 Unspecified convulsions: Secondary | ICD-10-CM

## 2016-01-27 DIAGNOSIS — R7989 Other specified abnormal findings of blood chemistry: Secondary | ICD-10-CM | POA: Diagnosis not present

## 2016-01-27 DIAGNOSIS — R55 Syncope and collapse: Secondary | ICD-10-CM | POA: Diagnosis not present

## 2016-01-27 DIAGNOSIS — Z8719 Personal history of other diseases of the digestive system: Secondary | ICD-10-CM | POA: Diagnosis not present

## 2016-01-27 DIAGNOSIS — E876 Hypokalemia: Secondary | ICD-10-CM | POA: Diagnosis not present

## 2016-01-27 DIAGNOSIS — F1021 Alcohol dependence, in remission: Secondary | ICD-10-CM | POA: Diagnosis not present

## 2016-01-27 DIAGNOSIS — I4581 Long QT syndrome: Secondary | ICD-10-CM

## 2016-01-27 LAB — BASIC METABOLIC PANEL
Anion gap: 7 (ref 5–15)
CHLORIDE: 114 mmol/L — AB (ref 101–111)
CO2: 24 mmol/L (ref 22–32)
CREATININE: 0.46 mg/dL (ref 0.44–1.00)
Calcium: 8.1 mg/dL — ABNORMAL LOW (ref 8.9–10.3)
GFR calc Af Amer: >60 mL/min (ref >60)
GFR calc non Af Amer: >60 mL/min (ref >60)
GLUCOSE: 85 mg/dL (ref 65–99)
Potassium: 3.5 mmol/L (ref 3.5–5.1)
SODIUM: 145 mmol/L (ref 135–145)

## 2016-01-27 LAB — CBC
HCT: 32.1 % — ABNORMAL LOW (ref 36.0–46.0)
Hemoglobin: 10.6 g/dL — ABNORMAL LOW (ref 12.0–15.0)
MCH: 31.5 pg (ref 26.0–34.0)
MCHC: 33 g/dL (ref 30.0–36.0)
MCV: 95.3 fL (ref 78.0–100.0)
PLATELETS: 584 10*3/uL — AB (ref 150–400)
RBC: 3.37 MIL/uL — ABNORMAL LOW (ref 3.87–5.11)
RDW: 17.3 % — AB (ref 11.5–15.5)
WBC: 4.1 10*3/uL (ref 4.0–10.5)

## 2016-01-27 LAB — MAGNESIUM: Magnesium: 2.2 mg/dL (ref 1.7–2.4)

## 2016-01-27 MED ORDER — POTASSIUM CHLORIDE CRYS ER 20 MEQ PO TBCR
40.0000 meq | EXTENDED_RELEASE_TABLET | Freq: Once | ORAL | Status: AC
  Administered 2016-01-27: 40 meq via ORAL
  Filled 2016-01-27: qty 2

## 2016-01-27 MED ORDER — LORAZEPAM 0.5 MG PO TABS
0.5000 mg | ORAL_TABLET | Freq: Four times a day (QID) | ORAL | Status: DC | PRN
  Administered 2016-01-27 – 2016-01-28 (×2): 0.5 mg via ORAL
  Filled 2016-01-27 (×2): qty 1

## 2016-01-27 MED ORDER — LOSARTAN POTASSIUM 50 MG PO TABS
50.0000 mg | ORAL_TABLET | Freq: Every day | ORAL | Status: DC
  Administered 2016-01-27 – 2016-01-29 (×3): 50 mg via ORAL
  Filled 2016-01-27 (×3): qty 1

## 2016-01-27 NOTE — Procedures (Signed)
ELECTROENCEPHALOGRAM REPORT  Date of Study: 01/27/2016  Patient's Name: Ashley Bradley MRN: [id number] Date of Birth: August 12, 1950  Referring Provider: Dr. Clydia Llano  Clinical History: This is a 65 year old woman with an unwitnessed fall and question of seizure, patient amnestic of events until EMS arrived.  Medications: acetaminophen (TYLENOL) tablet 650 mg  aspirin EC tablet 81 mg  HYDROcodone-acetaminophen (NORCO/VICODIN) 5-325 MG per tablet 1-2 tablet  morphine 2 MG/ML injection 1 mg  multivitamin with minerals tablet 1 tablet  prochlorperazine (COMPAZINE) injection 5 mg  vancomycin (VANCOCIN) 50 mg/mL oral solution 125 mg  Technical Summary: A multichannel digital EEG recording measured by the international 10-20 system with electrodes applied with paste and impedances below 5000 ohms performed in our laboratory with EKG monitoring in an awake and drowsy patient.  Hyperventilation and photic stimulation were not performed.  The digital EEG was referentially recorded, reformatted, and digitally filtered in a variety of bipolar and referential montages for optimal display.    Description: The patient is awake and drowsy during the recording.  There is no clear posterior dominant rhythm. The record is symmetric.  During drowsiness, there is an increase in theta slowing of the background. Deeper stages of sleep were not seen. Hyperventilation and photic stimulation were not performed. There is a 30-second run of left frontotemporal sharp waves, intermittently occurring at 5-6 Hz frequency without evolution in amplitude or frequency. No clinical symptoms reported.   EKG lead was unremarkable.  Impression: This awake and drowsy EEG is abnormal due to the presence of a 30-second run of left frontotemporal sharp waves without clear evolution in frequency or amplitude.  Clinical Correlation of the above findings indicates focal cerebral dysfunction over the left frontotemporal region  with possible epileptogenic potential.    Patrcia Dolly, M.D.

## 2016-01-27 NOTE — Evaluation (Signed)
Physical Therapy Evaluation Patient Details Name: Ashley Bradley MRN: [id number] DOB: 01/05/1951 Today's Date: 01/27/2016   History of Present Illness  65 yo female admitted with syncope, fall, forehead hematoma. Hx of Sz, cerebral palsy, alcoholism, depression, C-diff  Clinical Impression  On eval, pt required Min assist for mobility. She walked ~15 feet x 2 with RW. HR up to 135 bpm, dyspnea 2/4 with short distance ambulation. Will follow and progress activity as tolerated. Recommend return to SNF for continued rehab.     Follow Up Recommendations SNF    Equipment Recommendations  Rolling walker with 5" wheels    Recommendations for Other Services       Precautions / Restrictions Precautions Precautions: Fall Precaution Comments: c Diff, monitor VS- hr GOES UP Restrictions Weight Bearing Restrictions: No      Mobility  Bed Mobility Overal bed mobility: Needs Assistance Bed Mobility: Supine to Sit;Sit to Supine     Supine to sit: Min assist Sit to supine: Min assist   General bed mobility comments: Increased time and small amount of assistance.   Transfers Overall transfer level: Needs assistance Equipment used: Rolling walker (2 wheeled) Transfers: Sit to/from Stand Sit to Stand: Min assist         General transfer comment: Assist to stabilize. VCs safety.   Ambulation/Gait Ambulation/Gait assistance: Min assist Ambulation Distance (Feet): 15 Feet (x2) Assistive device: Rolling walker (2 wheeled) Gait Pattern/deviations: Decreased dorsiflexion - right;Steppage;Step-to pattern     General Gait Details: VCs safety, sequence. slow gait speed. Intermittent assist to stabilize. HR up to 130s, dyspnea 2/4 with short distance to/from bathroom.   Stairs            Wheelchair Mobility    Modified Rankin (Stroke Patients Only)       Balance Overall balance assessment: Needs assistance;History of Falls         Standing balance support: Bilateral  upper extremity supported;During functional activity Standing balance-Leahy Scale: Poor                               Pertinent Vitals/Pain Pain Assessment: No/denies pain    Home Living Family/patient expects to be discharged to:: Skilled nursing facility                      Prior Function                 Hand Dominance        Extremity/Trunk Assessment   Upper Extremity Assessment: Generalized weakness           Lower Extremity Assessment: RLE deficits/detail RLE Deficits / Details: drop foot/R heel cord tightness    Cervical / Trunk Assessment: Kyphotic  Communication   Communication: No difficulties  Cognition Arousal/Alertness: Awake/alert Behavior During Therapy: WFL for tasks assessed/performed Overall Cognitive Status: Within Functional Limits for tasks assessed                      General Comments      Exercises        Assessment/Plan    PT Assessment Patient needs continued PT services  PT Diagnosis Difficulty walking;Abnormality of gait;Generalized weakness   PT Problem List Decreased strength;Decreased range of motion;Decreased activity tolerance;Decreased balance;Decreased mobility;Decreased knowledge of use of DME  PT Treatment Interventions DME instruction;Gait training;Functional mobility training;Balance training;Therapeutic exercise;Therapeutic activities;Patient/family education   PT Goals (Current goals can be found  in the Care Plan section) Acute Rehab PT Goals Patient Stated Goal: to get stronger. regain independence PT Goal Formulation: With patient Time For Goal Achievement: 03/12/16 Potential to Achieve Goals: Good    Frequency Min 2X/week   Barriers to discharge        Co-evaluation               End of Session   Activity Tolerance: Patient tolerated treatment well Patient left: in bed;with call bell/phone within reach;with family/visitor present      Functional Assessment  Tool Used: clinical judgement Functional Limitation: Mobility: Walking and moving around Mobility: Walking and Moving Around Current Status (Z6109): At least 1 percent but less than 20 percent impaired, limited or restricted Mobility: Walking and Moving Around Goal Status [phone]): At least 1 percent but less than 20 percent impaired, limited or restricted    Time: 1423-1433 PT Time Calculation (min) (ACUTE ONLY): 10 min   Charges:   PT Evaluation $PT Eval Low Complexity: 1 Procedure     PT G Codes:   PT G-Codes **NOT FOR INPATIENT CLASS** Functional Assessment Tool Used: clinical judgement Functional Limitation: Mobility: Walking and moving around Mobility: Walking and Moving Around Current Status (U9811): At least 1 percent but less than 20 percent impaired, limited or restricted Mobility: Walking and Moving Around Goal Status [phone]): At least 1 percent but less than 20 percent impaired, limited or restricted    Rebeca Alert, MPT Pager: [phone]

## 2016-01-27 NOTE — Clinical Social Work Note (Signed)
Clinical Social Work Assessment  Patient Details  Name: Ashley Bradley MRN: [id number] Date of Birth: 1951-04-18  Date of referral:  01/27/16               Reason for consult:  Facility Placement                Permission sought to share information with:  Facility Industrial/product designer granted to share information::  Yes, Verbal Permission Granted  Name::        Agency::     Relationship::     Contact Information:     Housing/Transportation Living arrangements for the past 2 months:  Skilled Nursing Facility Source of Information:  Patient Patient Interpreter Needed:  None Criminal Activity/Legal Involvement Pertinent to Current Situation/Hospitalization:  No - Comment as needed Significant Relationships:  Adult Children, Spouse Lives with:  Spouse Do you feel safe going back to the place where you live?  Yes Need for family participation in patient care:  Yes (Comment)  Care giving concerns:  CSW received consult that patient was admitted from [facility], patient was just sent to [facility] on Saturday - 7/22.    Social Worker assessment / plan:  CSW reviewed PT evaluation recommending SNF & spoke with patient who states that she would prefer to return home with her husband but is agreeable to have CSW work her up for SNF as a backup.   Employment status:    Insurance information:    PT Recommendations:  Skilled Nursing Facility Information / Referral to community resources:  Skilled Nursing Facility  Patient/Family's Response to care:  Patient is requesting that if she go to SNF, that she return to [facility].   Patient/Family's Understanding of and Emotional Response to Diagnosis, Current Treatment, and Prognosis:  Patient is concerned about her blood pressure being high.   Emotional Assessment Appearance:  Appears stated age Attitude/Demeanor/Rapport:    Affect (typically observed):    Orientation:  Oriented to Self, Oriented to Place, Oriented to   Time, Oriented to Situation Alcohol / Substance use:    Psych involvement (Current and /or in the community):     Discharge Needs  Concerns to be addressed:    Readmission within the last 30 days:    Current discharge risk:    Barriers to Discharge:      [name], LCSW 01/27/2016, 4:21 PM

## 2016-01-27 NOTE — Progress Notes (Signed)
EEG completed, results pending. 

## 2016-01-27 NOTE — Clinical Social Work Placement (Signed)
   CLINICAL SOCIAL WORK PLACEMENT  NOTE  Date:  01/27/2016  Patient Details  Name: Ashley Bradley MRN: [id number] Date of Birth: 1950-12-09  Clinical Social Work is seeking post-discharge placement for this patient at the Skilled  Nursing Facility level of care (*CSW will initial, date and re-position this form in  chart as items are completed):  Yes   Patient/family provided with [facility] Clinical Social Work Department's list of facilities offering this level of care within the geographic area requested by the patient (or if unable, by the patient's family).  Yes   Patient/family informed of their freedom to choose among providers that offer the needed level of care, that participate in Medicare, Medicaid or managed care program needed by the patient, have an available bed and are willing to accept the patient.  Yes   Patient/family informed of [facility]'s ownership interest in [facility] and [facility], as well as of the fact that they are under no obligation to receive care at these facilities.  PASRR submitted to EDS on       PASRR number received on       Existing PASRR number confirmed on 01/27/16     FL2 transmitted to all facilities in geographic area requested by pt/family on 01/27/16     FL2 transmitted to all facilities within larger geographic area on       Patient informed that his/her managed care company has contracts with or will negotiate with certain facilities, including the following:            Patient/family informed of bed offers received.  Patient chooses bed at       Physician recommends and patient chooses bed at      Patient to be transferred to   on  .  Patient to be transferred to facility by       Patient family notified on   of transfer.  Name of family member notified:        PHYSICIAN       Additional Comment:    _______________________________________________ [name], LCSW 01/27/2016, 4:24 PM

## 2016-01-27 NOTE — NC FL2 (Signed)
[place] MEDICAID FL2 LEVEL OF CARE SCREENING TOOL     IDENTIFICATION  Patient Name: Ashley Bradley Birthdate: 28-Apr-1951 Sex: female Admission Date (Current Location): 01/26/2016  [facility] and [place] Number:  Producer, television/film/video and Address:  [facility],  [address] [place] [address]      Provider Number: [id number]  Attending Physician Name and Address:  Calvert Cantor, MD  Relative Name and Phone Number:       Current Level of Care: Hospital Recommended Level of Care: Skilled Nursing Facility Prior Approval Number:    Date Approved/Denied:   PASRR Number: [id number]  Discharge Plan: Home    Current Diagnoses: Patient Active Problem List   Diagnosis Date Noted  . Syncope 01/26/2016  . Prolonged Q-T interval on ECG 01/26/2016  . Hypokalemia 01/26/2016  . History of Clostridium difficile colitis 01/26/2016  . Elevated troponin   . Alcohol use disorder, moderate, in early remission, dependence (HCC) 01/21/2016  . Acute kidney injury (HCC) 01/16/2016  . Alcohol withdrawal (HCC) 01/16/2016  . Alcohol related seizure (HCC) 09/24/2013  . UTI (urinary tract infection) 09/24/2013  . Hyponatremia 09/20/2013  . Major depression (HCC) 03/19/2013  . Alcohol intoxication in relapsed alcoholic (HCC) 03/19/2013    Orientation RESPIRATION BLADDER Height & Weight     Self, Time, Situation, Place  Normal Continent Weight: 159 lb 3.2 oz (72.2 kg) Height:   (165.1 cm)  BEHAVIORAL SYMPTOMS/MOOD NEUROLOGICAL BOWEL NUTRITION STATUS      Continent Diet (Heart Healthy)  AMBULATORY STATUS COMMUNICATION OF NEEDS Skin   Extensive Assist Verbally Normal                       Personal Care Assistance Level of Assistance  Bathing, Feeding, Dressing Bathing Assistance: Limited assistance Feeding assistance: Limited assistance Dressing Assistance: Limited assistance     Functional Limitations Info  Sight, Hearing, Speech Sight Info: Adequate Hearing  Info: Adequate Speech Info: Adequate    SPECIAL CARE FACTORS FREQUENCY  PT (By licensed PT), OT (By licensed OT)     PT Frequency: 5 OT Frequency: 5            Contractures      Additional Factors Info  Code Status Code Status Info: Fullcode Allergies Info: Macrodantin           Current Medications (01/27/2016):  This is the current hospital active medication list Current Facility-Administered Medications  Medication Dose Route Frequency Provider Last Rate Last Dose  . acetaminophen (TYLENOL) tablet 650 mg  650 mg Oral Q6H PRN Clydia Llano, MD       Or  . acetaminophen (TYLENOL) suppository 650 mg  650 mg Rectal Q6H PRN Clydia Llano, MD      . aspirin EC tablet 81 mg  81 mg Oral Daily Clydia Llano, MD   81 mg at 01/27/16 1021  . feeding supplement (PRO-STAT SUGAR FREE 64) liquid 30 mL  30 mL Oral Daily Clydia Llano, MD   30 mL at 01/26/16 1529  . heparin injection 5,000 Units  5,000 Units Subcutaneous Q8H Clydia Llano, MD   5,000 Units at 01/27/16 1335  . HYDROcodone-acetaminophen (NORCO/VICODIN) 5-325 MG per tablet 1-2 tablet  1-2 tablet Oral Q4H PRN Clydia Llano, MD      . LORazepam (ATIVAN) tablet 0.5 mg  0.5 mg Oral Q6H PRN Calvert Cantor, MD      . losartan (COZAAR) tablet 50 mg  50 mg Oral Daily Calvert Cantor, MD      .  morphine 2 MG/ML injection 1 mg  1 mg Intravenous Q4H PRN Clydia Llano, MD      . multivitamin with minerals tablet 1 tablet  1 tablet Oral Daily Clydia Llano, MD   1 tablet at 01/27/16 1021  . potassium chloride SA (K-DUR,KLOR-CON) CR tablet 40 mEq  40 mEq Oral Once Calvert Cantor, MD      . prochlorperazine (COMPAZINE) injection 5 mg  5 mg Intravenous Q6H PRN Clydia Llano, MD      . saccharomyces boulardii (FLORASTOR) capsule 250 mg  250 mg Oral BID Clydia Llano, MD   250 mg at 01/27/16 1021  . sodium chloride flush (NS) 0.9 % injection 3 mL  3 mL Intravenous Q12H Clydia Llano, MD   3 mL at 01/27/16 1000  . vancomycin (VANCOCIN) 50 mg/mL oral solution 125  mg  125 mg Oral QID Clydia Llano, MD   125 mg at 01/27/16 1336     Discharge Medications: Please see discharge summary for a list of discharge medications.  Relevant Imaging Results:  Relevant Lab Results:   Additional Information ss#: [id number]  [name], LCSW

## 2016-01-27 NOTE — Progress Notes (Addendum)
PROGRESS NOTE    Ashley Bradley  WUJ:[id number] DOB: 11/05/1950 DOA: 01/26/2016  PCP: [name], MD   Brief Narrative:  65 y/o with h/o c diff on Vancomycin who was just discharged to SNF on 7/22. Other PMH includes depression, alcohol abuse, cerebral palsy & HTN. She states she continued to have diarrhea after discharge. She passed out while getting up to go the the bathroom and was found by SNF staff. She recalls waking up and finding the EMS present, asking her questions which she was able to answer appropriately. No loss of control of bowel or bladder and no tongue biting. No chest pain or neurological symptoms.   Subjective: Feels much better today. No longer having diarrhea. Eating and drinking fluids well.   Assessment & Plan:   Principal Problem:   Syncope - dehydration and orthostatic? Urinating well - stop IVF - has prolonged QT- ? arrhythmia - EEG ordered for possible seizure- shows "focal cerebral dysfunction over the left frontotemporal region with possible epileptogenic potential"-have asked for neuro consult >> neuro requesting and MRI - ECHO shows normal EF, Gr 1 diastolic dysfunction, mild TR - CT head shows no acute intracranial abnormality - 3 sets of troponin are 0.03 - flat trend- no chest pain  Active Problems:   Scalp hematoma - left parietal- follow  C diff colitis - cont oral Vancomycin - started on 7/16- patient questioning whether she received all of her doses at the SNF- will request MAR to confirm- may need to extend course if she was not given med appropriately  Hypokalemia  - due to diarrhea? - replace and follow    Alcohol use disorder, moderate, in early remission, dependence  - not drinking recently due to hospital admission and SNF stay    Prolonged Q-T interval on ECG - QT 487 today  HTN - Losartan stopped when last discharged - will resume  Fatty liver - noted on CT  Pedal edema/ venous insufficiency  - TEDS on  discharge  DVT prophylaxis: heparin Code Status: full code Family Communication:  Disposition Plan: home vs snf based on how she does with ambulation- possible d/c tomorrow Consultants:   none Procedures:   none Antimicrobials:  Anti-infectives    Start     Dose/Rate Route Frequency Ordered Stop   01/26/16 1800  vancomycin (VANCOCIN) 50 mg/mL oral solution 125 mg     125 mg Oral 4 times daily 01/26/16 1404         Objective: Vitals:   01/27/16 0513 01/27/16 1328 01/27/16 1343 01/27/16 1456  BP: (!) 151/60 (!) 169/97 (!) 158/86 (!) 144/78  Pulse: 65 80  88  Resp: 18 16    Temp: 98 F (36.7 C) 98 F (36.7 C)    TempSrc: Oral Oral    SpO2: 98% 100%    Weight:      Height:        Intake/Output Summary (Last 24 hours) at 01/27/16 1530 Last data filed at 01/27/16 1459  Gross per 24 hour  Intake             2320 ml  Output             1401 ml  Net              919 ml   Filed Weights   01/26/16 1344  Weight: 72.2 kg (159 lb 3.2 oz)    Examination: General exam: Appears comfortable  HEENT: PERRLA, oral mucosa moist, no sclera icterus  or thrush Respiratory system: Clear to auscultation. Respiratory effort normal. Cardiovascular system: S1 & S2 heard, RRR.  No murmurs  Gastrointestinal system: Abdomen soft, non-tender, nondistended. Normal bowel sound. No organomegaly Central nervous system: Alert and oriented. No focal neurological deficits. Extremities: No cyanosis, clubbing or edema Skin: No rashes or ulcers Psychiatry:  Mood & affect appropriate.     Data Reviewed: I have personally reviewed following labs and imaging studies  CBC:  Recent Labs Lab 01/21/16 0513 01/26/16 0700 01/27/16 0445  WBC 6.9 7.0 4.1  HGB 12.1 10.5* 10.6*  HCT 35.8* 31.6* 32.1*  MCV 92.7 93.5 95.3  PLT 194 518* 584*   Basic Metabolic Panel:  Recent Labs Lab 01/21/16 0513 01/22/16 0452 01/23/16 0546 01/26/16 0700 01/26/16 0940 01/27/16 0445  NA 138 140  --  144  --   145  K 3.6 3.4*  --  3.1*  --  3.5  CL 104 105  --  111  --  114*  CO2 30 32  --  26  --  24  GLUCOSE 110* 92  --  84  --  85  BUN 7 6  --  6  --  <5*  CREATININE 0.54 0.49 0.44 0.53  --  0.46  CALCIUM 7.8* 7.7*  --  8.4*  --  8.1*  MG  --   --   --   --  1.9 2.2   GFR: Estimated Creatinine Clearance: 68.9 mL/min (by C-G formula based on SCr of 0.8 mg/dL). Liver Function Tests:  Recent Labs Lab 01/21/16 0513 01/22/16 0452 01/26/16 0935  AST 102* 69* 36  ALT 100* 76* 53  ALKPHOS 164* 152* 132*  BILITOT 0.4 0.7 0.8  PROT 5.1* 4.6* 6.6  ALBUMIN 2.5* 2.3* 3.4*   No results for input(s): LIPASE, AMYLASE in the last 168 hours. No results for input(s): AMMONIA in the last 168 hours. Coagulation Profile:  Recent Labs Lab 01/26/16 1555  INR 1.13   Cardiac Enzymes:  Recent Labs Lab 01/26/16 0935 01/26/16 1555 01/26/16 2258  TROPONINI 0.03* 0.03* 0.03*   BNP (last 3 results) No results for input(s): PROBNP in the last 8760 hours. HbA1C: No results for input(s): HGBA1C in the last 72 hours. CBG:  Recent Labs Lab 01/21/16 1147 01/21/16 1639 01/21/16 2045 01/22/16 0708 01/26/16 0604  GLUCAP 112* 101* 107* 78 86   Lipid Profile: No results for input(s): CHOL, HDL, LDLCALC, TRIG, CHOLHDL, LDLDIRECT in the last 72 hours. Thyroid Function Tests:  Recent Labs  01/26/16 1555  TSH 2.951   Anemia Panel: No results for input(s): VITAMINB12, FOLATE, FERRITIN, TIBC, IRON, RETICCTPCT in the last 72 hours. Urine analysis:    Component Value Date/Time   COLORURINE YELLOW 01/26/2016 1045   APPEARANCEUR CLEAR 01/26/2016 1045   LABSPEC 1.011 01/26/2016 1045   PHURINE 7.5 01/26/2016 1045   GLUCOSEU NEGATIVE 01/26/2016 1045   HGBUR TRACE (A) 01/26/2016 1045   BILIRUBINUR NEGATIVE 01/26/2016 1045   KETONESUR NEGATIVE 01/26/2016 1045   PROTEINUR NEGATIVE 01/26/2016 1045   UROBILINOGEN 1.0 09/23/2013 0732   NITRITE NEGATIVE 01/26/2016 1045   LEUKOCYTESUR NEGATIVE  01/26/2016 1045   Sepsis Labs: (procalcitonin:4,lacticidven:4) )No results found for this or any previous visit (from the past 240 hour(s)).       Radiology Studies: Dg Chest 2 View  Result Date: 01/26/2016 CLINICAL DATA:  Syncope.  Possible seizure this morning. EXAM: CHEST  2 VIEW COMPARISON:  PA and lateral chest 01/21/2016 and 01/31/2010. FINDINGS: The lungs are clear.  Heart size is upper normal. No pneumothorax or pleural effusion. No focal bony abnormality. IMPRESSION: No acute disease. Electronically Signed   By: Drusilla Kanner M.D.   On: 01/26/2016 09:20  Ct Head Wo Contrast  Result Date: 01/26/2016 CLINICAL DATA:  Grand mal seizure with apparent fall EXAM: CT HEAD WITHOUT CONTRAST TECHNIQUE: Contiguous axial images were obtained from the base of the skull through the vertex without intravenous contrast. COMPARISON:  September 20, 2013 FINDINGS: Brain: Mild atrophy in a generalized manner remain stable. There is marginally greater atrophy in the frontal regions than elsewhere, stable. There is no intracranial mass, hemorrhage, extra-axial fluid collection, or midline shift. There is basal ganglia calcification bilaterally, felt to be physiologic, stable. There is minimal periventricular small vessel disease bilaterally. Elsewhere gray-white compartments appear normal. No acute infarct evident. Vascular: No hyperdense vessels are evident. There is no appreciable abnormal vascular calcification. Skull: The bony calvarium appears intact. There is a left parietal scalp hematoma near the vertex. Sinuses/Orbits: Visualized paranasal sinuses are clear. Visualized orbits appear symmetric bilaterally. Other: Mastoid air cells are clear. IMPRESSION: Left parietal scalp hematoma. No fracture evident. Mild atrophy with minimal periventricular small vessel disease. No intracranial mass, hemorrhage, or extra-axial fluid collection. No acute infarct evident. Basal ganglia calcification, stable and  likely physiologic. Electronically Signed   By: Bretta Bang III M.D.   On: 01/26/2016 09:29     Scheduled Meds: . aspirin EC  81 mg Oral Daily  . feeding supplement (PRO-STAT SUGAR FREE 64)  30 mL Oral Daily  . heparin  5,000 Units Subcutaneous Q8H  . multivitamin with minerals  1 tablet Oral Daily  . saccharomyces boulardii  250 mg Oral BID  . sodium chloride flush  3 mL Intravenous Q12H  . vancomycin  125 mg Oral QID   Continuous Infusions:    LOS: 0 days    Time spent in minutes: 35    Carlicia Leavens, MD [facility] Pager: [email or web address] Password [email or web address] 01/27/2016, 3:30 PM

## 2016-01-28 ENCOUNTER — Observation Stay (HOSPITAL_COMMUNITY): Payer: Managed Care, Other (non HMO)

## 2016-01-28 ENCOUNTER — Observation Stay (HOSPITAL_BASED_OUTPATIENT_CLINIC_OR_DEPARTMENT_OTHER)
Admit: 2016-01-28 | Discharge: 2016-01-28 | Disposition: A | Discharge status: Home / Self Care | Payer: Managed Care, Other (non HMO) | Attending: Neurology | Admitting: Neurology

## 2016-01-28 DIAGNOSIS — E876 Hypokalemia: Secondary | ICD-10-CM | POA: Diagnosis not present

## 2016-01-28 DIAGNOSIS — A047 Enterocolitis due to Clostridium difficile: Secondary | ICD-10-CM | POA: Diagnosis not present

## 2016-01-28 DIAGNOSIS — R569 Unspecified convulsions: Secondary | ICD-10-CM

## 2016-01-28 DIAGNOSIS — R7989 Other specified abnormal findings of blood chemistry: Secondary | ICD-10-CM | POA: Diagnosis not present

## 2016-01-28 DIAGNOSIS — R55 Syncope and collapse: Secondary | ICD-10-CM | POA: Diagnosis not present

## 2016-01-28 LAB — BASIC METABOLIC PANEL
ANION GAP: 7 (ref 5–15)
BUN: 7 mg/dL (ref 6–20)
CALCIUM: 8.5 mg/dL — AB (ref 8.9–10.3)
CO2: 25 mmol/L (ref 22–32)
CREATININE: 0.56 mg/dL (ref 0.44–1.00)
Chloride: 109 mmol/L (ref 101–111)
Glucose, Bld: 90 mg/dL (ref 65–99)
Potassium: 4.1 mmol/L (ref 3.5–5.1)
Sodium: 141 mmol/L (ref 135–145)

## 2016-01-28 LAB — CBC
HCT: 32.3 % — ABNORMAL LOW (ref 36.0–46.0)
HEMOGLOBIN: 10.4 g/dL — AB (ref 12.0–15.0)
MCH: 30.8 pg (ref 26.0–34.0)
MCHC: 32.2 g/dL (ref 30.0–36.0)
MCV: 95.6 fL (ref 78.0–100.0)
Platelets: 600 10*3/uL — ABNORMAL HIGH (ref 150–400)
RBC: 3.38 MIL/uL — AB (ref 3.87–5.11)
RDW: 16.9 % — ABNORMAL HIGH (ref 11.5–15.5)
WBC: 3.7 10*3/uL — ABNORMAL LOW (ref 4.0–10.5)

## 2016-01-28 LAB — HEMOGLOBIN A1C
HEMOGLOBIN A1C: 4.9 % (ref 4.8–5.6)
MEAN PLASMA GLUCOSE: 94 mg/dL

## 2016-01-28 LAB — MAGNESIUM: MAGNESIUM: 2.3 mg/dL (ref 1.7–2.4)

## 2016-01-28 MED ORDER — GADOBENATE DIMEGLUMINE 529 MG/ML IV SOLN
15.0000 mL | Freq: Once | INTRAVENOUS | Status: AC | PRN
  Administered 2016-01-28: 14 mL via INTRAVENOUS

## 2016-01-28 MED ORDER — FLUOXETINE HCL 20 MG PO CAPS
20.0000 mg | ORAL_CAPSULE | Freq: Every day | ORAL | Status: DC
  Administered 2016-01-28: 20 mg via ORAL
  Filled 2016-01-28: qty 1

## 2016-01-28 NOTE — Progress Notes (Signed)
Offsite EEG completed, results pending. 

## 2016-01-28 NOTE — Procedures (Signed)
ELECTROENCEPHALOGRAM REPORT  Date of Study: 01/28/2016  Patient's Name: MALAYKA SWAGLER MRN: [id number] Date of Birth: 10/16/1950  Referring Provider: Felicie Morn, PA-C  Clinical History: 65 year old woman with seizures  Medications: acetaminophen (TYLENOL) tablet 650 mg   aspirin EC tablet 81 mg   feeding supplement (PRO-STAT SUGAR FREE 64) liquid 30 mL   heparin injection 5,000 Units   HYDROcodone-acetaminophen (NORCO/VICODIN) 5-325 MG per tablet 1-2 tablet   morphine 2 MG/ML injection 1 mg   multivitamin with minerals tablet 1 tablet   prochlorperazine (COMPAZINE) injection 5 mg   saccharomyces boulardii (FLORASTOR) capsule 250 mg   sodium chloride flush (NS) 0.9 % injection 3 mL   vancomycin (VANCOCIN) 50 mg/mL oral solution 125 mg   Technical Summary: A multichannel digital EEG recording measured by the international 10-20 system with electrodes applied with paste and impedances below 5000 ohms performed in our laboratory with EKG monitoring in an awake and drowsy patient.  Hyperventilation and photic stimulation were not performed.  The digital EEG was referentially recorded, reformatted, and digitally filtered in a variety of bipolar and referential montages for optimal display.      Description: The patient is awake and drowsy during the recording.  There is no clear posterior dominant rhythm. The record is symmetric.  During drowsiness, there is an increase in theta slowing of the background. Deeper stages of sleep were not seen. Hyperventilation and photic stimulation were not performed.  EKG lead was unremarkable.  Impression: This awake and drowsy EEG is normal.    Clinical Correlation: A normal EEG does not exclude a clinical diagnosis of epilepsy.  If further clinical questions remain, prolonged EEG may be helpful.  Clinical correlation is advised.   Shon Millet, DO

## 2016-01-28 NOTE — Progress Notes (Signed)
Pt aware that [facility] could not take her insurance. [facility] was selected and referral was call to in house rep.

## 2016-01-28 NOTE — Progress Notes (Signed)
PROGRESS NOTE    Ashley Bradley  ZOX:[id number] DOB: 02/28/1951 DOA: 01/26/2016  PCP: [name], MD   Brief Narrative:  65 y/o with h/o c diff on Vancomycin who was just discharged to SNF on 7/22. Other PMH includes depression, alcohol abuse, cerebral palsy & HTN. She states she continued to have diarrhea after discharge. She passed out while getting up to go the the bathroom and was found by SNF staff. She recalls waking up and finding the EMS present, asking her questions which she was able to answer appropriately. No loss of control of bowel or bladder and no tongue biting. No chest pain or neurological symptoms.   Subjective: Feels good. No complaints. Having some semi-formed stool. No blood in stool. No nausea- eating well.   Assessment & Plan:   Principal Problem:   Syncope - dehydration and orthostatic? Urinating well now - stopped IVF - had prolonged QT on admission- ? arrhythmia - EEG ordered for possible seizure- shows "focal cerebral dysfunction over the left frontotemporal region with possible epileptogenic potential"-have asked for neuro consult >> neuro requesting an MRI - ECHO shows normal EF, Gr 1 diastolic dysfunction, mild TR - CT head shows no acute intracranial abnormality - 3 sets of troponin are 0.03 - flat trend- no chest pain  Active Problems:   Scalp hematoma - left parietal- follow  C diff colitis - cont oral Vancomycin - started on 7/16- patient questioning whether she received all of her doses at the SNF- requested MAR to confirm- she was given all doses appropriately   Hypokalemia  - due to diarrhea? - replaced      Alcohol use disorder, moderate, in early remission, dependence  - not drinking recently due to hospital admission and SNF stay    Prolonged Q-T interval on ECG - repeat QT 487   HTN - Losartan stopped when last discharged -  resumed  Fatty liver - from ETOH abuse   - noted on CT   Pedal edema/ venous insufficiency  -  TEDS on discharge  DVT prophylaxis: heparin Code Status: full code Family Communication: husband Disposition Plan: home  Consultants:   none Procedures:   none Antimicrobials:  Anti-infectives    Start     Dose/Rate Route Frequency Ordered Stop   01/26/16 1800  vancomycin (VANCOCIN) 50 mg/mL oral solution 125 mg     125 mg Oral 4 times daily 01/26/16 1404         Objective: Vitals:   01/27/16 1343 01/27/16 1456 01/27/16 2258 01/28/16 0516  BP: (!) 158/86 (!) 144/78 136/75 133/68  Pulse:  88 87 99  Resp:   16 16  Temp:   98.6 F (37 C) 98 F (36.7 C)  TempSrc:   Oral Oral  SpO2:   96% 99%  Weight:      Height:        Intake/Output Summary (Last 24 hours) at 01/28/16 1250 Last data filed at 01/28/16 0800  Gross per 24 hour  Intake              960 ml  Output             2752 ml  Net            -1792 ml   Filed Weights   01/26/16 1344  Weight: 72.2 kg (159 lb 3.2 oz)    Examination: General exam: Appears comfortable  HEENT: PERRLA, oral mucosa moist, no sclera icterus or thrush Respiratory system: Clear to  auscultation. Respiratory effort normal. Cardiovascular system: S1 & S2 heard, RRR.  No murmurs  Gastrointestinal system: Abdomen soft, non-tender, nondistended. Normal bowel sound. No organomegaly Central nervous system: Alert and oriented. No focal neurological deficits. Extremities: No cyanosis, clubbing or edema Skin: No rashes or ulcers Psychiatry:  Mood & affect appropriate.     Data Reviewed: I have personally reviewed following labs and imaging studies  CBC:  Recent Labs Lab 01/26/16 0700 01/27/16 0445 01/28/16 0500  WBC 7.0 4.1 3.7*  HGB 10.5* 10.6* 10.4*  HCT 31.6* 32.1* 32.3*  MCV 93.5 95.3 95.6  PLT 518* 584* 600*   Basic Metabolic Panel:  Recent Labs Lab 01/22/16 0452 01/23/16 0546 01/26/16 0700 01/26/16 0940 01/27/16 0445 01/28/16 0500  NA 140  --  144  --  145 141  K 3.4*  --  3.1*  --  3.5 4.1  CL 105  --  111  --   114* 109  CO2 32  --  26  --  24 25  GLUCOSE 92  --  84  --  85 90  BUN 6  --  6  --  <5* 7  CREATININE 0.49 0.44 0.53  --  0.46 0.56  CALCIUM 7.7*  --  8.4*  --  8.1* 8.5*  MG  --   --   --  1.9 2.2 2.3   GFR: Estimated Creatinine Clearance: 68.9 mL/min (by C-G formula based on SCr of 0.8 mg/dL). Liver Function Tests:  Recent Labs Lab 01/22/16 0452 01/26/16 0935  AST 69* 36  ALT 76* 53  ALKPHOS 152* 132*  BILITOT 0.7 0.8  PROT 4.6* 6.6  ALBUMIN 2.3* 3.4*   No results for input(s): LIPASE, AMYLASE in the last 168 hours. No results for input(s): AMMONIA in the last 168 hours. Coagulation Profile:  Recent Labs Lab 01/26/16 1555  INR 1.13   Cardiac Enzymes:  Recent Labs Lab 01/26/16 0935 01/26/16 1555 01/26/16 2258  TROPONINI 0.03* 0.03* 0.03*   BNP (last 3 results) No results for input(s): PROBNP in the last 8760 hours. HbA1C:  Recent Labs  01/26/16 1555  HGBA1C 4.9   CBG:  Recent Labs Lab 01/21/16 1639 01/21/16 2045 01/22/16 0708 01/26/16 0604  GLUCAP 101* 107* 78 86   Lipid Profile: No results for input(s): CHOL, HDL, LDLCALC, TRIG, CHOLHDL, LDLDIRECT in the last 72 hours. Thyroid Function Tests:  Recent Labs  01/26/16 1555  TSH 2.951   Anemia Panel: No results for input(s): VITAMINB12, FOLATE, FERRITIN, TIBC, IRON, RETICCTPCT in the last 72 hours. Urine analysis:    Component Value Date/Time   COLORURINE YELLOW 01/26/2016 1045   APPEARANCEUR CLEAR 01/26/2016 1045   LABSPEC 1.011 01/26/2016 1045   PHURINE 7.5 01/26/2016 1045   GLUCOSEU NEGATIVE 01/26/2016 1045   HGBUR TRACE (A) 01/26/2016 1045   BILIRUBINUR NEGATIVE 01/26/2016 1045   KETONESUR NEGATIVE 01/26/2016 1045   PROTEINUR NEGATIVE 01/26/2016 1045   UROBILINOGEN 1.0 09/23/2013 0732   NITRITE NEGATIVE 01/26/2016 1045   LEUKOCYTESUR NEGATIVE 01/26/2016 1045   Sepsis Labs: (procalcitonin:4,lacticidven:4) )No results found for this or any previous visit (from the  past 240 hour(s)).       Radiology Studies: No results found.    Scheduled Meds: . aspirin EC  81 mg Oral Daily  . feeding supplement (PRO-STAT SUGAR FREE 64)  30 mL Oral Daily  . FLUoxetine  20 mg Oral QHS  . heparin  5,000 Units Subcutaneous Q8H  . losartan  50 mg Oral Daily  .  multivitamin with minerals  1 tablet Oral Daily  . saccharomyces boulardii  250 mg Oral BID  . sodium chloride flush  3 mL Intravenous Q12H  . vancomycin  125 mg Oral QID   Continuous Infusions:    LOS: 0 days    Time spent in minutes: 35    Ashley Fowers, MD [facility] Pager: [email or web address] Password [email or web address] 01/28/2016, 12:50 PM

## 2016-01-28 NOTE — Progress Notes (Addendum)
Spoke with pt concerning discharge plans home with [facility] vs SNF. Pt states, I am going home with [facility]PT.  Pt selected [facility], referral called to in house rep with [facility]. [facility] can not take pt's insurance at this time.

## 2016-01-28 NOTE — Consult Note (Signed)
NEURO HOSPITALIST CONSULT NOTE   Requestig physician: Dr. Butler Denmark   Reason for Consult: syncope/Seizure   History obtained from:  Patient     HPI:                                                                                                                                          Ashley Bradley is an 65 y.o. female who was at a nursing facility for a prolonged time and per patient has been in the bed for a prolonged duration. She was a two person assist due to her weakness. She was brought to the bathroom and then left alone. Patient does not recall all parts of event but does recall passing out and coming back to and EMS was with her. She did hit the left posterior portion of her head. She has a history of ETOH abuse but has been sober for a long time. She has a history of seizure but this was provoked due to ETOH withdrawal.  The fall on this occasion was unwitnessed--but due to hearing a thud, staff told EMS it was a seizure. EEG was done while in the hospital showing "presence of a 30-second run of left frontotemporal sharp waves without clear evolution in frequency or amplitude."    Due to location of electrode, concern is this may be artifact.   Past Medical History:  Diagnosis Date  . Alcoholism (HCC)   . Complication of anesthesia    hard time waking up  . CP (cerebral palsy) (HCC)   . Depression   . Hypertension   . PONV (postoperative nausea and vomiting)   . Seizures (HCC)    ETOH induced    Past Surgical History:  Procedure Laterality Date  . BLADDER SURGERY    . BREAST SURGERY    . TUBAL LIGATION      History reviewed. No pertinent family history.    Social History:  reports that she has never smoked. She has never used smokeless tobacco. She reports that she drinks alcohol. She reports that she does not use drugs.  Allergies  Allergen Reactions  . Macrodantin [Nitrofurantoin] Nausea And Vomiting    MEDICATIONS:  Scheduled: . aspirin EC  81 mg Oral Daily  . feeding supplement (PRO-STAT SUGAR FREE 64)  30 mL Oral Daily  . heparin  5,000 Units Subcutaneous Q8H  . losartan  50 mg Oral Daily  . multivitamin with minerals  1 tablet Oral Daily  . saccharomyces boulardii  250 mg Oral BID  . sodium chloride flush  3 mL Intravenous Q12H  . vancomycin  125 mg Oral QID     ROS:                                                                                                                                       History obtained from the patient  General ROS: negative for - chills, fatigue, fever, night sweats, weight gain or weight loss Psychological ROS: negative for - behavioral disorder, hallucinations, memory difficulties, mood swings or suicidal ideation Ophthalmic ROS: negative for - blurry vision, double vision, eye pain or loss of vision ENT ROS: negative for - epistaxis, nasal discharge, oral lesions, sore throat, tinnitus or vertigo Allergy and Immunology ROS: negative for - hives or itchy/watery eyes Hematological and Lymphatic ROS: negative for - bleeding problems, bruising or swollen lymph nodes Endocrine ROS: negative for - galactorrhea, hair pattern changes, polydipsia/polyuria or temperature intolerance Respiratory ROS: negative for - cough, hemoptysis, shortness of breath or wheezing Cardiovascular ROS: negative for - chest pain, dyspnea on exertion, edema or irregular heartbeat Gastrointestinal ROS: negative for - abdominal pain, diarrhea, hematemesis, nausea/vomiting or stool incontinence Genito-Urinary ROS: negative for - dysuria, hematuria, incontinence or urinary frequency/urgency Musculoskeletal ROS: negative for - joint swelling or muscular weakness Neurological ROS: as noted in HPI Dermatological ROS: negative for rash and skin lesion changes   Blood pressure 133/68, pulse 99, temperature  98 F (36.7 C), temperature source Oral, resp. rate 16, height  (1.651 m), weight 72.2 kg (159 lb 3.2 oz), SpO2 99 %.   Neurologic Examination:                                                                                                      HEENT-  Normocephalic, no lesions, without obvious abnormality.  Normal external eye and conjunctiva.  Normal TM's bilaterally.  Normal auditory canals and external ears. Normal external nose, mucus membranes and septum.  Normal pharynx. Cardiovascular- S1, S2 normal, pulses palpable throughout   Lungs- chest clear, no wheezing, rales, normal symmetric air entry Abdomen- normal findings: bowel sounds normal Extremities- no edema Lymph-no adenopathy palpable Musculoskeletal-no joint  tenderness, deformity or swelling Skin-warm and dry, no hyperpigmentation, vitiligo, or suspicious lesions  Neurological Examination Mental Status: Alert, oriented, thought content appropriate.  Speech fluent without evidence of aphasia.  Able to follow 3 step commands without difficulty. Cranial Nerves: II: Discs flat bilaterally; Visual fields grossly normal, pupils equal, round, reactive to light and accommodation III,IV, VI: ptosis not present, extra-ocular motions intact bilaterally V,VII: smile symmetric, facial light touch sensation normal bilaterally VIII: hearing normal bilaterally IX,X: uvula rises symmetrically XI: bilateral shoulder shrug XII: midline tongue extension Motor: Right : Upper extremity   5/5    Left:     Upper extremity   5/5  Lower extremity   5/5     Lower extremity   5/5 Tone and bulk:normal tone throughout; no atrophy noted Sensory: Pinprick and light touch intact throughout, bilaterally Deep Tendon Reflexes: 2+ and symmetric throughout Plantars: Right: downgoing   Left: downgoing Cerebellar: normal finger-to-nose, normal rapid alternating movements and normal heel-to-shin test Gait: not tested      Lab Results: Basic  Metabolic Panel:  Recent Labs Lab 01/22/16 0452 01/23/16 0546 01/26/16 0700 01/26/16 0940 01/27/16 0445 01/28/16 0500  NA 140  --  144  --  145 141  K 3.4*  --  3.1*  --  3.5 4.1  CL 105  --  111  --  114* 109  CO2 32  --  26  --  24 25  GLUCOSE 92  --  84  --  85 90  BUN 6  --  6  --  <5* 7  CREATININE 0.49 0.44 0.53  --  0.46 0.56  CALCIUM 7.7*  --  8.4*  --  8.1* 8.5*  MG  --   --   --  1.9 2.2 2.3    Liver Function Tests:  Recent Labs Lab 01/22/16 0452 01/26/16 0935  AST 69* 36  ALT 76* 53  ALKPHOS 152* 132*  BILITOT 0.7 0.8  PROT 4.6* 6.6  ALBUMIN 2.3* 3.4*   No results for input(s): LIPASE, AMYLASE in the last 168 hours. No results for input(s): AMMONIA in the last 168 hours.  CBC:  Recent Labs Lab 01/26/16 0700 01/27/16 0445 01/28/16 0500  WBC 7.0 4.1 3.7*  HGB 10.5* 10.6* 10.4*  HCT 31.6* 32.1* 32.3*  MCV 93.5 95.3 95.6  PLT 518* 584* 600*    Cardiac Enzymes:  Recent Labs Lab 01/26/16 0935 01/26/16 1555 01/26/16 2258  TROPONINI 0.03* 0.03* 0.03*    Lipid Panel: No results for input(s): CHOL, TRIG, HDL, CHOLHDL, VLDL, LDLCALC in the last 168 hours.  CBG:  Recent Labs Lab 01/21/16 1147 01/21/16 1639 01/21/16 2045 01/22/16 0708 01/26/16 0604  GLUCAP 112* 101* 107* 78 86    Microbiology: Results for orders placed or performed during the hospital encounter of 01/16/16  MRSA PCR Screening     Status: None   Collection Time: 01/16/16  1:57 PM  Result Value Ref Range Status   MRSA by PCR NEGATIVE NEGATIVE Final    Comment:        The GeneXpert MRSA Assay (FDA approved for NASAL specimens only), is one component of a comprehensive MRSA colonization surveillance program. It is not intended to diagnose MRSA infection nor to guide or monitor treatment for MRSA infections.   C difficile quick scan w PCR reflex     Status: Abnormal   Collection Time: 01/17/16  1:59 PM  Result Value Ref Range Status   C Diff antigen POSITIVE (A)  NEGATIVE Final    Comment: CRITICAL RESULT CALLED TO, READ BACK BY AND VERIFIED WITH: HABIB,I RN 1626 [id number] COVINGTON,N    C Diff toxin POSITIVE (A) NEGATIVE Final    Comment: CRITICAL RESULT CALLED TO, READ BACK BY AND VERIFIED WITH: HABIB,I RN 1626 [id number] COVINGTON,N    C Diff interpretation Toxin producing C. difficile detected.  Final    Comment: CRITICAL RESULT CALLED TO, READ BACK BY AND VERIFIED WITH: HABIB,I RN 1626 [id number] COVINGTON,N   Gastrointestinal Panel by PCR , Stool     Status: None   Collection Time: 01/17/16  1:59 PM  Result Value Ref Range Status   Campylobacter species NOT DETECTED NOT DETECTED Final   Plesimonas shigelloides NOT DETECTED NOT DETECTED Final   Salmonella species NOT DETECTED NOT DETECTED Final   Yersinia enterocolitica NOT DETECTED NOT DETECTED Final   Vibrio species NOT DETECTED NOT DETECTED Final   Vibrio cholerae NOT DETECTED NOT DETECTED Final   Enteroaggregative E coli (EAEC) NOT DETECTED NOT DETECTED Final   Enteropathogenic E coli (EPEC) NOT DETECTED NOT DETECTED Final   Enterotoxigenic E coli (ETEC) NOT DETECTED NOT DETECTED Final   Shiga like toxin producing E coli (STEC) NOT DETECTED NOT DETECTED Final   E. coli O157 NOT DETECTED NOT DETECTED Final   Shigella/Enteroinvasive E coli (EIEC) NOT DETECTED NOT DETECTED Final   Cryptosporidium NOT DETECTED NOT DETECTED Final   Cyclospora cayetanensis NOT DETECTED NOT DETECTED Final   Entamoeba histolytica NOT DETECTED NOT DETECTED Final   Giardia lamblia NOT DETECTED NOT DETECTED Final   Adenovirus F40/41 NOT DETECTED NOT DETECTED Final   Astrovirus NOT DETECTED NOT DETECTED Final   Norovirus GI/GII NOT DETECTED NOT DETECTED Final   Rotavirus A NOT DETECTED NOT DETECTED Final   Sapovirus (I, II, IV, and V) NOT DETECTED NOT DETECTED Final    Coagulation Studies:  Recent Labs  01/26/16 1555  LABPROT 14.2  INR 1.13    Imaging: Dg Chest 2 View  Result Date: 01/26/2016 CLINICAL  DATA:  Syncope.  Possible seizure this morning. EXAM: CHEST  2 VIEW COMPARISON:  PA and lateral chest 01/21/2016 and 01/31/2010. FINDINGS: The lungs are clear. Heart size is upper normal. No pneumothorax or pleural effusion. No focal bony abnormality. IMPRESSION: No acute disease. Electronically Signed   By: Drusilla Kanner M.D.   On: 01/26/2016 09:20  Ct Head Wo Contrast  Result Date: 01/26/2016 CLINICAL DATA:  Grand mal seizure with apparent fall EXAM: CT HEAD WITHOUT CONTRAST TECHNIQUE: Contiguous axial images were obtained from the base of the skull through the vertex without intravenous contrast. COMPARISON:  September 20, 2013 FINDINGS: Brain: Mild atrophy in a generalized manner remain stable. There is marginally greater atrophy in the frontal regions than elsewhere, stable. There is no intracranial mass, hemorrhage, extra-axial fluid collection, or midline shift. There is basal ganglia calcification bilaterally, felt to be physiologic, stable. There is minimal periventricular small vessel disease bilaterally. Elsewhere gray-white compartments appear normal. No acute infarct evident. Vascular: No hyperdense vessels are evident. There is no appreciable abnormal vascular calcification. Skull: The bony calvarium appears intact. There is a left parietal scalp hematoma near the vertex. Sinuses/Orbits: Visualized paranasal sinuses are clear. Visualized orbits appear symmetric bilaterally. Other: Mastoid air cells are clear. IMPRESSION: Left parietal scalp hematoma. No fracture evident. Mild atrophy with minimal periventricular small vessel disease. No intracranial mass, hemorrhage, or extra-axial fluid collection. No acute infarct evident. Basal ganglia calcification, stable and likely physiologic. Electronically Signed   By: Chrissie Noa  Margarita Grizzle III M.D.   On: 01/26/2016 09:29      Assessment and plan per attending neurologist  Felicie Morn PA-C [facility] [phone]  01/28/2016, 9:06  AM   Assessment/Plan: 65 YO female with event concerning for syncope versus seizure. This was unwitnessed and there is no clear evidence of tonic clonic activity. EEG was read as "presence of a 30-second run of left frontotemporal sharp waves without clear evolution in frequency or amplitude" however there is question if the leads were placed over her hematoma. Will repeat EEG today, obtain MRI brain. At this time no need for EAD. Would also recommend multiple orthostatic vitals.    Neurology Attending:  Rosalynn was seen by Onalee Hua and I this morning. She reports that she feels strongly that she simply had a syncopal event. She describes having a febrile seizure as a child. However, she reports no further seizures since that time. She was admitted to the hospital from rehabilitation after a recent hospital admission for alcohol intoxication.  The EEG that was performed yesterday was read as showing a run of sharp discharges in the temporal region. However upon my direct review of the EEG it appeared that that was more consistent with artifact. We have requested a repeat EEG which has already been completed. On that EEG the artifact was no longer present it seems to have been related to the hematoma. The repeat EEG was normal.  As such, our recommendation would be to not initiate any antiepileptic therapies at this time. It is suspected this was a syncopal event.  The CT revealed no mass lesions hemorrhage or areas of infarct. It would seem reasonable to allow Ashley Bradley to return to rehabilitation when she is further cleared by the medicine team.  Plan:  1. Return to rehabilitation when cleared by the medicine team  2. No need for antiepileptic therapy at this time. It is suspected this was a syncopal event  Rose Fillers M.D. Neurohospitalist [phone]

## 2016-01-28 NOTE — Progress Notes (Signed)
PT Cancellation Note  Patient Details Name: Ashley Bradley MRN: [id number] DOB: [birth year]   Cancelled Treatment:    Reason Eval/Treat Not Completed: Patient at procedure or test/unavailable   Rada Hay 01/28/2016, 3:42 PM Blanchard Kelch PT [phone]

## 2016-01-29 ENCOUNTER — Other Ambulatory Visit: Payer: Self-pay

## 2016-01-29 DIAGNOSIS — E876 Hypokalemia: Secondary | ICD-10-CM | POA: Diagnosis not present

## 2016-01-29 DIAGNOSIS — R7989 Other specified abnormal findings of blood chemistry: Secondary | ICD-10-CM | POA: Diagnosis not present

## 2016-01-29 DIAGNOSIS — R55 Syncope and collapse: Secondary | ICD-10-CM | POA: Diagnosis not present

## 2016-01-29 DIAGNOSIS — F1021 Alcohol dependence, in remission: Secondary | ICD-10-CM | POA: Diagnosis not present

## 2016-01-29 LAB — CBC
HCT: 33.1 % — ABNORMAL LOW (ref 36.0–46.0)
HEMOGLOBIN: 10.8 g/dL — AB (ref 12.0–15.0)
MCH: 31.1 pg (ref 26.0–34.0)
MCHC: 32.6 g/dL (ref 30.0–36.0)
MCV: 95.4 fL (ref 78.0–100.0)
Platelets: 583 10*3/uL — ABNORMAL HIGH (ref 150–400)
RBC: 3.47 MIL/uL — ABNORMAL LOW (ref 3.87–5.11)
RDW: 17.2 % — AB (ref 11.5–15.5)
WBC: 3.4 10*3/uL — ABNORMAL LOW (ref 4.0–10.5)

## 2016-01-29 LAB — BASIC METABOLIC PANEL
ANION GAP: 7 (ref 5–15)
BUN: 8 mg/dL (ref 6–20)
CALCIUM: 8.8 mg/dL — AB (ref 8.9–10.3)
CO2: 24 mmol/L (ref 22–32)
Chloride: 110 mmol/L (ref 101–111)
Creatinine, Ser: 0.57 mg/dL (ref 0.44–1.00)
GFR calc Af Amer: >60 mL/min (ref >60)
GLUCOSE: 98 mg/dL (ref 65–99)
Potassium: 3.8 mmol/L (ref 3.5–5.1)
Sodium: 141 mmol/L (ref 135–145)

## 2016-01-29 MED ORDER — FLUOXETINE HCL 20 MG PO CAPS: 20.0000 mg | ORAL_CAPSULE | Freq: Every day | ORAL | 3 refills | Status: DC

## 2016-01-29 MED ORDER — LOSARTAN POTASSIUM 50 MG PO TABS: 50.0000 mg | ORAL_TABLET | Freq: Every day | ORAL | 0 refills | Status: DC

## 2016-01-29 MED ORDER — VANCOMYCIN 50 MG/ML ORAL SOLUTION: 125.0000 mg | Freq: Four times a day (QID) | ORAL | 0 refills | Status: AC

## 2016-01-29 MED ORDER — T.E.D. BELOW KNEE/LARGE MISC: 1.0000 | Freq: Every day | 0 refills | Status: DC

## 2016-01-29 NOTE — Progress Notes (Signed)
Patient discharged home with husband, discharge instructions given and explained to patient and she verbalized understanding. Denies any distress, accompanied home by husband and friend, transported to the car by staff. No wound noted, skin intact

## 2016-01-29 NOTE — Discharge Instructions (Signed)
Do not drink any more alcohol.  Do not drive for at least the next 2 wks. You PCP will assess you and tell you when it is safe for you to drive independently.   See your doctor in 1 wk and take this discharge paperwork with you.

## 2016-01-29 NOTE — Progress Notes (Signed)
Subjective: No complaints, no further events. Feeling well and desiring to get back to work.   Objective: Current vital signs: BP 129/77 (BP Location: Right Arm)   Pulse 97   Temp 99.2 F (37.3 C) (Oral)   Resp 19   Ht  (1.651 m)   Wt 72.2 kg (159 lb 3.2 oz)   SpO2 100%   BMI 26.49 kg/m  Vital signs in last 24 hours: Temp:  [98 F (36.7 C)-99.2 F (37.3 C)] 99.2 F (37.3 C) (07/28 0405) Pulse Rate:  [91-97] 97 (07/28 0405) Resp:  [19-22] 19 (07/28 0405) BP: (123-129)/(77-83) 129/77 (07/28 0405) SpO2:  [100 %] 100 % (07/28 0405) Weight:  [72.1 kg (159 lb)] 72.1 kg (159 lb) (07/27 1815)  Intake/Output from previous day: 07/27 0701 - 07/28 0700 In: 800 [P.O.:800] Out: 2203 [Urine:2200; Stool:3] Intake/Output this shift: No intake/output data recorded. Nutritional status: Diet Heart Room service appropriate? Yes; Fluid consistency: Thin  ROS:                                                                                                                                       History obtained from the patient  General ROS: negative for - chills, fatigue, fever, night sweats, weight gain or weight loss Psychological ROS: negative for - behavioral disorder, hallucinations, memory difficulties, mood swings or suicidal ideation Ophthalmic ROS: negative for - blurry vision, double vision, eye pain or loss of vision ENT ROS: negative for - epistaxis, nasal discharge, oral lesions, sore throat, tinnitus or vertigo Allergy and Immunology ROS: negative for - hives or itchy/watery eyes Hematological and Lymphatic ROS: negative for - bleeding problems, bruising or swollen lymph nodes Endocrine ROS: negative for - galactorrhea, hair pattern changes, polydipsia/polyuria or temperature intolerance Respiratory ROS: negative for - cough, hemoptysis, shortness of breath or wheezing Cardiovascular ROS: negative for - chest pain, dyspnea on exertion, edema or irregular  heartbeat Gastrointestinal ROS: negative for - abdominal pain, diarrhea, hematemesis, nausea/vomiting or stool incontinence Genito-Urinary ROS: negative for - dysuria, hematuria, incontinence or urinary frequency/urgency Musculoskeletal ROS: negative for - joint swelling or muscular weakness Neurological ROS: as noted in HPI Dermatological ROS: negative for rash and skin lesion changes    Neurologic Exam: General: NAD Mental Status: Alert, oriented, thought content appropriate.  Speech fluent without evidence of aphasia.  Able to follow 3 step commands without difficulty. Cranial Nerves: II:  Visual fields grossly normal, pupils equal, round, reactive to light and accommodation III,IV, VI: ptosis not present, extra-ocular motions intact bilaterally V,VII: smile symmetric, facial light touch sensation normal bilaterally VIII: hearing normal bilaterally IX,X: uvula rises symmetrically XI: bilateral shoulder shrug XII: midline tongue extension without atrophy or fasciculations  Motor: Right : Upper extremity   5/5    Left:     Upper extremity   5/5  Lower extremity   5/5     Lower extremity   5/5  Tone and bulk:normal tone throughout; no atrophy noted Sensory: Pinprick and light touch intact throughout, bilaterally Deep Tendon Reflexes:  Right: Upper Extremity   Left: Upper extremity   biceps (C-5 to C-6) 2/4   biceps (C-5 to C-6) 2/4 tricep (C7) 2/4    triceps (C7) 2/4 Brachioradialis (C6) 2/4  Brachioradialis (C6) 2/4  Lower Extremity Lower Extremity  quadriceps (L-2 to L-4) 2/4   quadriceps (L-2 to L-4) 2/4 Achilles (S1) 2/4   Achilles (S1) 2/4   Lab Results: Basic Metabolic Panel:  Recent Labs Lab 01/23/16 0546  01/26/16 0700 01/26/16 0940 01/27/16 0445 01/28/16 0500 01/29/16 0519  NA  --   --  144  --  145 141 141  K  --   --  3.1*  --  3.5 4.1 3.8  CL  --   --  111  --  114* 109 110  CO2  --   --  26  --  24 25 24   GLUCOSE  --   --  84  --  85 90 98  BUN  --    --  6  --  <5* 7 8  CREATININE 0.44  --  0.53  --  0.46 0.56 0.57  CALCIUM  --   < > 8.4*  --  8.1* 8.5* 8.8*  MG  --   --   --  1.9 2.2 2.3  --   < > = values in this interval not displayed.  Liver Function Tests:  Recent Labs Lab 01/26/16 0935  AST 36  ALT 53  ALKPHOS 132*  BILITOT 0.8  PROT 6.6  ALBUMIN 3.4*   No results for input(s): LIPASE, AMYLASE in the last 168 hours. No results for input(s): AMMONIA in the last 168 hours.  CBC:  Recent Labs Lab 01/26/16 0700 01/27/16 0445 01/28/16 0500 01/29/16 0519  WBC 7.0 4.1 3.7* 3.4*  HGB 10.5* 10.6* 10.4* 10.8*  HCT 31.6* 32.1* 32.3* 33.1*  MCV 93.5 95.3 95.6 95.4  PLT 518* 584* 600* 583*    Cardiac Enzymes:  Recent Labs Lab 01/26/16 0935 01/26/16 1555 01/26/16 2258  TROPONINI 0.03* 0.03* 0.03*    Lipid Panel: No results for input(s): CHOL, TRIG, HDL, CHOLHDL, VLDL, LDLCALC in the last 168 hours.  CBG:  Recent Labs Lab 01/26/16 0604  GLUCAP 86    Microbiology: Results for orders placed or performed during the hospital encounter of 01/16/16  MRSA PCR Screening     Status: None   Collection Time: 01/16/16  1:57 PM  Result Value Ref Range Status   MRSA by PCR NEGATIVE NEGATIVE Final    Comment:        The GeneXpert MRSA Assay (FDA approved for NASAL specimens only), is one component of a comprehensive MRSA colonization surveillance program. It is not intended to diagnose MRSA infection nor to guide or monitor treatment for MRSA infections.   C difficile quick scan w PCR reflex     Status: Abnormal   Collection Time: 01/17/16  1:59 PM  Result Value Ref Range Status   C Diff antigen POSITIVE (A) NEGATIVE Final    Comment: CRITICAL RESULT CALLED TO, READ BACK BY AND VERIFIED WITH: HABIB,I RN 1626 [id number] COVINGTON,N    C Diff toxin POSITIVE (A) NEGATIVE Final    Comment: CRITICAL RESULT CALLED TO, READ BACK BY AND VERIFIED WITH: HABIB,I RN 1626 [id number] COVINGTON,N    C Diff interpretation  Toxin producing C. difficile detected.  Final    Comment:  CRITICAL RESULT CALLED TO, READ BACK BY AND VERIFIED WITH: HABIB,I RN 1626 [id number] COVINGTON,N   Gastrointestinal Panel by PCR , Stool     Status: None   Collection Time: 01/17/16  1:59 PM  Result Value Ref Range Status   Campylobacter species NOT DETECTED NOT DETECTED Final   Plesimonas shigelloides NOT DETECTED NOT DETECTED Final   Salmonella species NOT DETECTED NOT DETECTED Final   Yersinia enterocolitica NOT DETECTED NOT DETECTED Final   Vibrio species NOT DETECTED NOT DETECTED Final   Vibrio cholerae NOT DETECTED NOT DETECTED Final   Enteroaggregative E coli (EAEC) NOT DETECTED NOT DETECTED Final   Enteropathogenic E coli (EPEC) NOT DETECTED NOT DETECTED Final   Enterotoxigenic E coli (ETEC) NOT DETECTED NOT DETECTED Final   Shiga like toxin producing E coli (STEC) NOT DETECTED NOT DETECTED Final   E. coli O157 NOT DETECTED NOT DETECTED Final   Shigella/Enteroinvasive E coli (EIEC) NOT DETECTED NOT DETECTED Final   Cryptosporidium NOT DETECTED NOT DETECTED Final   Cyclospora cayetanensis NOT DETECTED NOT DETECTED Final   Entamoeba histolytica NOT DETECTED NOT DETECTED Final   Giardia lamblia NOT DETECTED NOT DETECTED Final   Adenovirus F40/41 NOT DETECTED NOT DETECTED Final   Astrovirus NOT DETECTED NOT DETECTED Final   Norovirus GI/GII NOT DETECTED NOT DETECTED Final   Rotavirus A NOT DETECTED NOT DETECTED Final   Sapovirus (I, II, IV, and V) NOT DETECTED NOT DETECTED Final    Coagulation Studies:  Recent Labs  01/26/16 1555  LABPROT 14.2  INR 1.13    Imaging: Mr Laqueta Jean VW Contrast  Result Date: 01/28/2016 CLINICAL DATA:  Syncope.  Fall.  Seizure history. EXAM: MRI HEAD WITHOUT AND WITH CONTRAST TECHNIQUE: Multiplanar, multiecho pulse sequences of the brain and surrounding structures were obtained without and with intravenous contrast. CONTRAST:  23mL MULTIHANCE GADOBENATE DIMEGLUMINE 529 MG/ML IV SOLN  COMPARISON:  CT head 01/26/2016 FINDINGS: Ventricle size is normal.  Mild cortical atrophy. Negative for acute infarct. Several small white matter hyperintensities bilaterally consistent with mild chronic microvascular ischemia. Basal ganglia and brainstem intact. Negative for subdural hematoma.  No intracranial hemorrhage. Left parietal scalp hematoma noted. Negative for mass or edema.  No shift of the midline structures Temporal lobe morphology and signal is normal. Normal enhancement postcontrast administration. No enhancing mass lesion. Normal venous enhancement. Paranasal sinuses clear. Orbital contents normal. Pituitary not enlarged. IMPRESSION: Mild atrophy with mild chronic microvascular ischemic change. No acute intracranial abnormality. Left parietal scalp hematoma. Electronically Signed   By: Marlan Palau M.D.   On: 01/28/2016 19:02   Medications:  Scheduled: . aspirin EC  81 mg Oral Daily  . feeding supplement (PRO-STAT SUGAR FREE 64)  30 mL Oral Daily  . FLUoxetine  20 mg Oral QHS  . heparin  5,000 Units Subcutaneous Q8H  . losartan  50 mg Oral Daily  . multivitamin with minerals  1 tablet Oral Daily  . saccharomyces boulardii  250 mg Oral BID  . sodium chloride flush  3 mL Intravenous Q12H  . vancomycin  125 mg Oral QID    Assessment/Plan: Repeat EEG showed no sharp waves or epileptiform activity. Given history and repeat EEG highly suspect syncopal episode. MRI brain with and without contrast was also negative for predisposing factors for seizure. At this time would not initiate AED.  No further in hospital neurological testing required. Neurology will S/O    Felicie Morn PA-C [facility] [phone]  01/29/2016     Neurology Attending:  Agree with above.  Sahithi has been doing well. It is suspected she experienced a syncopal event. Her initial EEG appeared suspicious for artifact activity. Her follow-up EEG revealed no epileptiform discharges.  Since her  admission to the hospital Saanvika has experienced no seizure activity. She is presently back to her baseline. She appears ready for discharge at this time.  Plan:  1. Agree with discharge back to rehabilitation facility.  2. Follow up with primary care physician after discharge  Rose Fillers M.D. Neurohospitalist [phone]

## 2016-01-29 NOTE — Discharge Summary (Signed)
Physician Discharge Summary  ANGELES Bradley ZOX:[id number] DOB: [birth year] DOA: 01/26/2016  PCP: [name], MD  Admit date: 01/26/2016 Discharge date: 01/29/2016  Ad[facility]  Disposition:  Home with husband and 24 hr care  Recommendations for Outpatient Follow-up:  1. Avoid QT prolonging meds 2. Follow closely for ongoing alcohol abuse 3. Avoid antibiotics- follow for recurrence of C diff colitis 4. No driving for at least 2 wks until reassessed by PCP and given permission to drive  Home Health:  yes - RN, PT, Aid Equipment/Devices:  walker    Discharge Condition:  stable   CODE STATUS:  Full code   Diet recommendation:  Heart healthy Consultations:  Neuro    Discharge Diagnoses:  Principal Problem:   Syncope Active Problems:   Alcohol use disorder, moderate, in early remission, dependence (HCC)   Prolonged Q-T interval on ECG   Hypokalemia   History of Clostridium difficile colitis   Elevated troponin    Subjective: 65 y/o with h/o c diff on Vancomycin who was just discharged to SNF on 7/22. Other PMH includes depression, alcohol abuse, cerebral palsy & HTN. She states she continued to have diarrhea after discharge. She passed out while getting up to go the the bathroom and was found by SNF staff. She recalls waking up and finding the EMS present, asking her questions which she was able to answer appropriately. No loss of control of bowel or bladder and no tongue biting. No chest pain or neurological symptoms.   Brief Summary: Syncope - dehydration/orthostatic? - started on IVF- Urinating and drinking well now- no diarrhea-  stopped IVF - had prolonged QT on admission- ? Arrhythmia causing her syncope - CT head shows no acute intracranial abnormality - EEG ordered for possible seizure- shows "focal cerebral dysfunction over the left frontotemporal region with possible epileptogenic potential" therefore I requested a neuro consult >> neuro reviewed  EEG and decided it was artifact- repeat EEG was done and found to be normal- Neuro requesting an MRI which was unrevealing- see below - ECHO shows normal EF, Gr 1 diastolic dysfunction, mild TR - 3 sets of troponin are 0.03 - flat trend- no chest pain  Active Problems:   Scalp hematoma - left parietal- following  C diff colitis - has received 12 days of treatment with Vancomycin - started on 7/16 - patient questioning whether she received all of her doses at the SNF where she was for 2 days - I requested MAR from [facility] to confirm- [facility] notes that she was given all doses appropriately however, patient and friend continue to insist that this is not accurate and that she did not receive it appropriately - will prescribe 2 more days of Vancomycin to complete a 14 day course - diarrhea has resolved  Hypokalemia  - due to diarrhea? - replaced      Alcohol use disorder, moderate, in early remission, dependence  - not drinking recently due to hospital admission and SNF stay- no withdrawal symptoms- advised to stop drinking completely    Prolonged Q-T interval on ECG - K replaced and Mg normal - repeat QT 487 and 493 - avoid QT prolonging meds  HTN - Losartan stopped when last discharged - resumed during this hospital stay  Fatty liver - from ETOH abuse   - noted on CT  Pedal edema/ venous insufficiency  - TEDS prescribed on discharge     Discharge Instructions  Discharge Instructions    Diet - low sodium heart  healthy    Complete by:  As directed   Diet - low sodium heart healthy    Complete by:  As directed   Increase activity slowly    Complete by:  As directed   Increase activity slowly    Complete by:  As directed       Medication List    STOP taking these medications   feeding supplement (PRO-STAT SUGAR FREE 64) Liqd   gabapentin 300 MG capsule Commonly known as:  NEURONTIN   lidocaine 2 % solution Commonly known as:  XYLOCAINE   LORazepam 0.5  MG tablet Commonly known as:  ATIVAN     TAKE these medications   aspirin EC 81 MG tablet Take 81 mg by mouth daily.   FLUoxetine 20 MG capsule Commonly known as:  PROZAC Take 1 capsule (20 mg total) by mouth daily.   losartan 50 MG tablet Commonly known as:  COZAAR Take 1 tablet (50 mg total) by mouth daily.   multivitamin with minerals Tabs tablet Take 1 tablet by mouth daily.   T.E.D. BELOW KNEE/LARGE Misc 1 each by Does not apply route daily. Remove at bedtime - patient must be measured to find appropriate size   vancomycin 50 mg/mL oral solution Commonly known as:  VANCOCIN Take 2.5 mLs (125 mg total) by mouth 4 (four) times daily.      Contact information for after-discharge care    Destination    [facility] .   Specialty:  Skilled Nursing Facility Contact information: [address] [place] [address] [phone]             Allergies  Allergen Reactions  . Macrodantin [Nitrofurantoin] Nausea And Vomiting     Procedures/Studies:   Ct Abdomen Pelvis Wo Contrast  Result Date: 01/16/2016 CLINICAL DATA:  Excessive drinking of alcohol, not getting out of bed. EXAM: CT ABDOMEN AND PELVIS WITHOUT CONTRAST TECHNIQUE: Multidetector CT imaging of the abdomen and pelvis was performed following the standard protocol without IV contrast. COMPARISON:  None. FINDINGS: Mild multilevel degenerative disc disease is noted in the lumbar spine. Mild bibasilar subsegmental atelectasis is noted. Fatty infiltration of the liver is noted. No definite gallstones are noted. The spleen and pancreas appear normal. Adrenal glands and kidneys appear normal. No hydronephrosis or renal obstruction is noted. No renal or ureteral calculi are noted. The appendix appears normal. Wall thickening of cecum is noted concerning for inflammation with surrounding inflammation. There is no evidence of bowel obstruction. Urinary bladder appears normal. Uterus and ovaries are  unremarkable. No significant adenopathy is noted. IMPRESSION: Fatty infiltration of the liver. Wall thickening of cecum is noted concerning for inflammatory or infectious colitis. Electronically Signed   By: Lupita Raider, M.D.   On: 01/16/2016 13:54   Dg Chest 2 View  Result Date: 01/26/2016 CLINICAL DATA:  Syncope.  Possible seizure this morning. EXAM: CHEST  2 VIEW COMPARISON:  PA and lateral chest 01/21/2016 and 01/31/2010. FINDINGS: The lungs are clear. Heart size is upper normal. No pneumothorax or pleural effusion. No focal bony abnormality. IMPRESSION: No acute disease. Electronically Signed   By: Drusilla Kanner M.D.   On: 01/26/2016 09:20  Dg Chest 2 View  Result Date: 01/21/2016 CLINICAL DATA:  Shortness of breath and cough for 3 weeks EXAM: CHEST  2 VIEW COMPARISON:  January 16, 2016 FINDINGS: There is mild bibasilar atelectasis. There is no edema or consolidation. Heart size and pulmonary vascularity are normal. No adenopathy. There is postoperative  change in the lower cervical region. IMPRESSION: Patchy bibasilar atelectasis. No edema or consolidation. Stable cardiac silhouette. Electronically Signed   By: Bretta Bang III M.D.   On: 01/21/2016 11:58   Dg Ribs Unilateral W/chest Right  Result Date: 01/16/2016 CLINICAL DATA:  Bilateral rib pain without reported injury. EXAM: RIGHT RIBS AND CHEST - 3+ VIEW COMPARISON:  Radiographs of September 20, 2013. FINDINGS: No fracture or other bone lesions are seen involving the ribs. There is no evidence of pneumothorax or pleural effusion. Both lungs are clear. Heart size and mediastinal contours are within normal limits. IMPRESSION: Normal right ribs.  No acute cardiopulmonary abnormality seen. Electronically Signed   By: Lupita Raider, M.D.   On: 01/16/2016 11:45   Ct Head Wo Contrast  Result Date: 01/26/2016 CLINICAL DATA:  Grand mal seizure with apparent fall EXAM: CT HEAD WITHOUT CONTRAST TECHNIQUE: Contiguous axial images were obtained  from the base of the skull through the vertex without intravenous contrast. COMPARISON:  September 20, 2013 FINDINGS: Brain: Mild atrophy in a generalized manner remain stable. There is marginally greater atrophy in the frontal regions than elsewhere, stable. There is no intracranial mass, hemorrhage, extra-axial fluid collection, or midline shift. There is basal ganglia calcification bilaterally, felt to be physiologic, stable. There is minimal periventricular small vessel disease bilaterally. Elsewhere gray-white compartments appear normal. No acute infarct evident. Vascular: No hyperdense vessels are evident. There is no appreciable abnormal vascular calcification. Skull: The bony calvarium appears intact. There is a left parietal scalp hematoma near the vertex. Sinuses/Orbits: Visualized paranasal sinuses are clear. Visualized orbits appear symmetric bilaterally. Other: Mastoid air cells are clear. IMPRESSION: Left parietal scalp hematoma. No fracture evident. Mild atrophy with minimal periventricular small vessel disease. No intracranial mass, hemorrhage, or extra-axial fluid collection. No acute infarct evident. Basal ganglia calcification, stable and likely physiologic. Electronically Signed   By: Bretta Bang III M.D.   On: 01/26/2016 09:29  Mr Laqueta Jean UJ Contrast  Result Date: 01/28/2016 CLINICAL DATA:  Syncope.  Fall.  Seizure history. EXAM: MRI HEAD WITHOUT AND WITH CONTRAST TECHNIQUE: Multiplanar, multiecho pulse sequences of the brain and surrounding structures were obtained without and with intravenous contrast. CONTRAST:  14mL MULTIHANCE GADOBENATE DIMEGLUMINE 529 MG/ML IV SOLN COMPARISON:  CT head 01/26/2016 FINDINGS: Ventricle size is normal.  Mild cortical atrophy. Negative for acute infarct. Several small white matter hyperintensities bilaterally consistent with mild chronic microvascular ischemia. Basal ganglia and brainstem intact. Negative for subdural hematoma.  No intracranial  hemorrhage. Left parietal scalp hematoma noted. Negative for mass or edema.  No shift of the midline structures Temporal lobe morphology and signal is normal. Normal enhancement postcontrast administration. No enhancing mass lesion. Normal venous enhancement. Paranasal sinuses clear. Orbital contents normal. Pituitary not enlarged. IMPRESSION: Mild atrophy with mild chronic microvascular ischemic change. No acute intracranial abnormality. Left parietal scalp hematoma. Electronically Signed   By: Marlan Palau M.D.   On: 01/28/2016 19:02  US Renal  Result Date: 01/17/2016 CLINICAL DATA:  Acute kidney injury. EXAM: RENAL / URINARY TRACT ULTRASOUND COMPLETE COMPARISON:  CT of the abdomen and pelvis 01/16/2016. FINDINGS: Right Kidney: Length: 10.3 cm, within normal limits. Echogenicity within normal limits. No mass or hydronephrosis visualized. Left Kidney: Length: 11.6 cm, within normal limits. Echogenicity within normal limits. No mass or hydronephrosis visualized. Bladder: A Foley catheter is present within the urinary bladder. There is some gas within the bladder is well. IMPRESSION: Normal sonographic appearance of the kidneys bilaterally. Electronically Signed  By: Marin Roberts M.D.   On: 01/17/2016 10:52       Discharge Exam: Vitals:   01/28/16 2021 01/29/16 0405  BP:  129/77  Pulse:  97  Resp: (!) 22 19  Temp:  99.2 F (37.3 C)   Vitals:   01/28/16 0516 01/28/16 1345 01/28/16 2021 01/29/16 0405  BP: 133/68 123/83  129/77  Pulse: 99 91  97  Resp: 16 20 (!) 22 19  Temp: 98 F (36.7 C) 98 F (36.7 C)  99.2 F (37.3 C)  TempSrc: Oral Oral Oral Oral  SpO2: 99% 100% 100% 100%  Weight:      Height:        General: Pt is alert, awake, not in acute distress Cardiovascular: RRR, S1/S2 +, no rubs, no gallops Respiratory: CTA bilaterally, no wheezing, no rhonchi Abdominal: Soft, NT, ND, bowel sounds + Extremities: no edema, no cyanosis    The results of significant  diagnostics from this hospitalization (including imaging, microbiology, ancillary and laboratory) are listed below for reference.     Microbiology: No results found for this or any previous visit (from the past 240 hour(s)).   Labs: BNP (last 3 results) No results for input(s): BNP in the last 8760 hours. Basic Metabolic Panel:  Recent Labs Lab 01/23/16 0546 01/26/16 0700 01/26/16 0940 01/27/16 0445 01/28/16 0500 01/29/16 0519  NA  --  144  --  145 141 141  K  --  3.1*  --  3.5 4.1 3.8  CL  --  111  --  114* 109 110  CO2  --  26  --  24 25 24   GLUCOSE  --  84  --  85 90 98  BUN  --  6  --  <5* 7 8  CREATININE 0.44 0.53  --  0.46 0.56 0.57  CALCIUM  --  8.4*  --  8.1* 8.5* 8.8*  MG  --   --  1.9 2.2 2.3  --    Liver Function Tests:  Recent Labs Lab 01/26/16 0935  AST 36  ALT 53  ALKPHOS 132*  BILITOT 0.8  PROT 6.6  ALBUMIN 3.4*   No results for input(s): LIPASE, AMYLASE in the last 168 hours. No results for input(s): AMMONIA in the last 168 hours. CBC:  Recent Labs Lab 01/26/16 0700 01/27/16 0445 01/28/16 0500 01/29/16 0519  WBC 7.0 4.1 3.7* 3.4*  HGB 10.5* 10.6* 10.4* 10.8*  HCT 31.6* 32.1* 32.3* 33.1*  MCV 93.5 95.3 95.6 95.4  PLT 518* 584* 600* 583*   Cardiac Enzymes:  Recent Labs Lab 01/26/16 0935 01/26/16 1555 01/26/16 2258  TROPONINI 0.03* 0.03* 0.03*   BNP: Invalid input(s): POCBNP CBG:  Recent Labs Lab 01/26/16 0604  GLUCAP 86   D-Dimer No results for input(s): DDIMER in the last 72 hours. Hgb A1c  Recent Labs  01/26/16 1555  HGBA1C 4.9   Lipid Profile No results for input(s): CHOL, HDL, LDLCALC, TRIG, CHOLHDL, LDLDIRECT in the last 72 hours. Thyroid function studies  Recent Labs  01/26/16 1555  TSH 2.951   Anemia work up No results for input(s): VITAMINB12, FOLATE, FERRITIN, TIBC, IRON, RETICCTPCT in the last 72 hours. Urinalysis    Component Value Date/Time   COLORURINE YELLOW 01/26/2016 1045   APPEARANCEUR  CLEAR 01/26/2016 1045   LABSPEC 1.011 01/26/2016 1045   PHURINE 7.5 01/26/2016 1045   GLUCOSEU NEGATIVE 01/26/2016 1045   HGBUR TRACE (A) 01/26/2016 1045   BILIRUBINUR NEGATIVE 01/26/2016 1045   KETONESUR  NEGATIVE 01/26/2016 1045   PROTEINUR NEGATIVE 01/26/2016 1045   UROBILINOGEN 1.0 09/23/2013 0732   NITRITE NEGATIVE 01/26/2016 1045   LEUKOCYTESUR NEGATIVE 01/26/2016 1045   Sepsis Labs Invalid input(s): PROCALCITONIN,  WBC,  LACTICIDVEN Microbiology No results found for this or any previous visit (from the past 240 hour(s)).   Time coordinating discharge: Over 30 minutes  SIGNED:   Calvert Cantor, MD  [facility] 01/29/2016, 10:53 AM Pager   If 7PM-7AM, please contact night-coverage [email or web address] Password [email or web address]

## 2016-06-03 ENCOUNTER — Emergency Department (HOSPITAL_COMMUNITY)
Admission: EM | Admit: 2016-06-03 | Discharge: 2016-06-03 | Disposition: A | Discharge status: Home / Self Care | Payer: Managed Care, Other (non HMO) | Attending: Physician Assistant | Admitting: Physician Assistant

## 2016-06-03 ENCOUNTER — Emergency Department (HOSPITAL_COMMUNITY): Payer: Managed Care, Other (non HMO)

## 2016-06-03 ENCOUNTER — Encounter (HOSPITAL_COMMUNITY): Payer: Self-pay | Admitting: Emergency Medicine

## 2016-06-03 DIAGNOSIS — Z23 Encounter for immunization: Secondary | ICD-10-CM | POA: Insufficient documentation

## 2016-06-03 DIAGNOSIS — Z7982 Long term (current) use of aspirin: Secondary | ICD-10-CM | POA: Diagnosis not present

## 2016-06-03 DIAGNOSIS — Z79899 Other long term (current) drug therapy: Secondary | ICD-10-CM | POA: Diagnosis not present

## 2016-06-03 DIAGNOSIS — I10 Essential (primary) hypertension: Secondary | ICD-10-CM | POA: Diagnosis not present

## 2016-06-03 DIAGNOSIS — F10120 Alcohol abuse with intoxication, uncomplicated: Secondary | ICD-10-CM | POA: Diagnosis not present

## 2016-06-03 DIAGNOSIS — Z5181 Encounter for therapeutic drug level monitoring: Secondary | ICD-10-CM | POA: Insufficient documentation

## 2016-06-03 DIAGNOSIS — F10920 Alcohol use, unspecified with intoxication, uncomplicated: Secondary | ICD-10-CM

## 2016-06-03 LAB — COMPREHENSIVE METABOLIC PANEL
ALT: 31 U/L (ref 14–54)
AST: 32 U/L (ref 15–41)
Albumin: 4.6 g/dL (ref 3.5–5.0)
Alkaline Phosphatase: 103 U/L (ref 38–126)
Anion gap: 10 (ref 5–15)
BILIRUBIN TOTAL: 0.5 mg/dL (ref 0.3–1.2)
BUN: 11 mg/dL (ref 6–20)
CALCIUM: 8.7 mg/dL — AB (ref 8.9–10.3)
CHLORIDE: 103 mmol/L (ref 101–111)
CO2: 28 mmol/L (ref 22–32)
CREATININE: 0.63 mg/dL (ref 0.44–1.00)
Glucose, Bld: 101 mg/dL — ABNORMAL HIGH (ref 65–99)
Potassium: 4.1 mmol/L (ref 3.5–5.1)
Sodium: 141 mmol/L (ref 135–145)
TOTAL PROTEIN: 7.6 g/dL (ref 6.5–8.1)

## 2016-06-03 LAB — CBC WITH DIFFERENTIAL/PLATELET
BASOS ABS: 0.1 10*3/uL (ref 0.0–0.1)
BASOS PCT: 2 %
EOS ABS: 0.1 10*3/uL (ref 0.0–0.7)
Eosinophils Relative: 2 %
HCT: 41.9 % (ref 36.0–46.0)
HEMOGLOBIN: 14.1 g/dL (ref 12.0–15.0)
LYMPHS ABS: 3.1 10*3/uL (ref 0.7–4.0)
Lymphocytes Relative: 45 %
MCH: 29.5 pg (ref 26.0–34.0)
MCHC: 33.7 g/dL (ref 30.0–36.0)
MCV: 87.7 fL (ref 78.0–100.0)
Monocytes Absolute: 0.4 10*3/uL (ref 0.1–1.0)
Monocytes Relative: 5 %
NEUTROS PCT: 46 %
Neutro Abs: 3.3 10*3/uL (ref 1.7–7.7)
PLATELETS: 285 10*3/uL (ref 150–400)
RBC: 4.78 MIL/uL (ref 3.87–5.11)
RDW: 14.1 % (ref 11.5–15.5)
WBC: 7 10*3/uL (ref 4.0–10.5)

## 2016-06-03 LAB — ETHANOL: ALCOHOL ETHYL (B): 297 mg/dL — AB (ref <5)

## 2016-06-03 LAB — PROTIME-INR
INR: 0.89
PROTHROMBIN TIME: 12 s (ref 11.4–15.2)

## 2016-06-03 LAB — LIPASE, BLOOD: LIPASE: 21 U/L (ref 11–51)

## 2016-06-03 LAB — AMMONIA: AMMONIA: 13 umol/L (ref 9–35)

## 2016-06-03 MED ORDER — TETANUS-DIPHTH-ACELL PERTUSSIS 5-2.5-18.5 LF-MCG/0.5 IM SUSP
0.5000 mL | Freq: Once | INTRAMUSCULAR | Status: AC
  Administered 2016-06-03: 0.5 mL via INTRAMUSCULAR
  Filled 2016-06-03: qty 0.5

## 2016-06-03 NOTE — ED Triage Notes (Signed)
Patient reports alcoholic.  States that she doesn't drink much everyday.  Unable to verbalize at present how much she drinks daily.  States that she had quite a bit to drink this morning but unable to state how much she had to drink this morning.

## 2016-06-03 NOTE — Discharge Instructions (Signed)
You were seen for alcohol use today. We have given used some follow-up information about inpatient rehabilitation facilities. Please follow-up with those. You're welcome back if you have any other needs.

## 2016-06-03 NOTE — ED Notes (Signed)
Patient given sandwich and coke.  Patient ambulated to bathroom x 1 assist without difficulty

## 2016-06-03 NOTE — ED Notes (Signed)
Bed: [facility] Expected date:  Expected time:  Means of arrival:  Comments: EMS/ETOH 

## 2016-06-03 NOTE — Progress Notes (Signed)
CSW staffed with EDP stating patient needing resources for detox.   [facility] CSW explained and provided resources for outpatient and residential services to assist patient with detox needs. Patient's husband had questions medically related. CSW informed him to speak with the Nurse or EDP for medical needs. No other questions noted at this time.   Posey ReaLaVonia Christophor Eick, LCSWA Clincial Social Worker [phone] 12:39 PM

## 2016-06-03 NOTE — ED Triage Notes (Signed)
Patient is from home, brought in by EMS for ETOH.  Patient is looking for alcohol detox.  Patient states she drinks everyday.  Denies any pain.  She has a history of seizures related to alcohol.    Pt has drunk a bottle of wine this AM  BP: 115/62 HR:104  R:16  CBG:107

## 2016-06-03 NOTE — ED Provider Notes (Signed)
[facility]-EMERGENCY DEPT Provider Note   CSN: [id number] Arrival date & time: 06/03/16  1011     History   Chief Complaint Chief Complaint  Patient presents with  . Alcohol Intoxication    HPI Ashley Bradley is a 65 y.o. female.  The history is provided by the patient.  Alcohol Intoxication  This is a recurrent problem. The current episode started less than 1 hour ago. The problem occurs constantly. The problem has not changed since onset.Pertinent negatives include no chest pain, no abdominal pain and no headaches. Nothing aggravates the symptoms. Nothing relieves the symptoms. She has tried nothing for the symptoms. The treatment provided no relief.   Pt unable to give hisotry.  When asked why she is here, she replies "I'm drunk!"  Reportedly husband called EMS for her drinking.   Leve 5 caveat AMS  Past Medical History:  Diagnosis Date  . Alcoholism (HCC)   . Complication of anesthesia    hard time waking up  . CP (cerebral palsy) (HCC)   . Depression   . Hypertension   . PONV (postoperative nausea and vomiting)   . Seizures (HCC)    ETOH induced    Patient Active Problem List   Diagnosis Date Noted  . Syncope 01/26/2016  . Prolonged Q-T interval on ECG 01/26/2016  . Hypokalemia 01/26/2016  . History of Clostridium difficile colitis 01/26/2016  . Elevated troponin   . Alcohol use disorder, moderate, in early remission, dependence (HCC) 01/21/2016  . Acute kidney injury (HCC) 01/16/2016  . Alcohol withdrawal (HCC) 01/16/2016  . Seizures (HCC) 09/24/2013  . UTI (urinary tract infection) 09/24/2013  . Hyponatremia 09/20/2013  . Major depression 03/19/2013  . Alcohol intoxication in relapsed alcoholic (HCC) 03/19/2013    Past Surgical History:  Procedure Laterality Date  . BLADDER SURGERY    . BREAST SURGERY    . TUBAL LIGATION      OB History    No data available       Home Medications    Prior to Admission medications   Medication Sig Start Date  End Date Taking? Authorizing Provider  aspirin EC 81 MG tablet Take 81 mg by mouth daily.   Yes Historical Provider, MD  FLUoxetine (PROZAC) 20 MG capsule Take 1 capsule (20 mg total) by mouth daily. 01/29/16  Yes Calvert CantorSaima Rizwan, MD  losartan (COZAAR) 50 MG tablet Take 1 tablet (50 mg total) by mouth daily. 01/29/16  Yes Calvert CantorSaima Rizwan, MD  Multiple Vitamin (MULTIVITAMIN WITH MINERALS) TABS tablet Take 1 tablet by mouth daily. 01/23/16  Yes Barnetta ChapelSylvester I Ogbata, MD  saccharomyces boulardii (FLORASTOR) 250 MG capsule Take 250 mg by mouth 2 (two) times daily.   Yes Historical Provider, MD  Elastic Bandages & Supports (T.E.D. BELOW KNEE/LARGE) MISC 1 each by Does not apply route daily. Remove at bedtime - patient must be measured to find appropriate size Patient not taking: Reported on 06/03/2016 01/29/16   Calvert CantorSaima Rizwan, MD    Family History History reviewed. No pertinent family history.  Social History Social History  Substance Use Topics  . Smoking status: Never Smoker  . Smokeless tobacco: Never Used  . Alcohol use Yes     Comment: 1 bottle wine per day x 2 weeks - 09/20/13     Allergies   Macrodantin [nitrofurantoin]   Review of Systems Review of Systems  Unable to perform ROS: Mental status change  Cardiovascular: Negative for chest pain.  Gastrointestinal: Negative for abdominal pain.  Neurological: Negative  for headaches.     Physical Exam Updated Vital Signs BP 120/74   Pulse 96   Temp 99.2 F (37.3 C) (Oral)   Resp 18   SpO2 92%   Physical Exam  Constitutional: She is oriented to person, place, and time. She appears well-developed and well-nourished.  HENT:  Head: Normocephalic and atraumatic.  Eyes: Right eye exhibits no discharge.  Cardiovascular: Normal rate.   Pulmonary/Chest: Effort normal.  Musculoskeletal:  Abrasiojn/bruise to right 4/5the metacarpal  Neurological: She is oriented to person, place, and time.  Skin: Skin is warm and dry. She is not diaphoretic.   Psychiatric: She has a normal mood and affect.  Consistent with acute intoxication  Nursing note and vitals reviewed.    ED Treatments / Results  Labs (all labs ordered are listed, but only abnormal results are displayed) Labs Reviewed  COMPREHENSIVE METABOLIC PANEL - Abnormal; Notable for the following:       Result Value   Glucose, Bld 101 (*)    Calcium 8.7 (*)    All other components within normal limits  ETHANOL - Abnormal; Notable for the following:    Alcohol, Ethyl (B) 297 (*)    All other components within normal limits  CBC WITH DIFFERENTIAL/PLATELET  LIPASE, BLOOD  PROTIME-INR  AMMONIA    EKG  EKG Interpretation None       Radiology Dg Chest 2 View  Result Date: 06/03/2016 CLINICAL DATA:  Altered mental status EXAM: CHEST  2 VIEW COMPARISON:  January 26, 2016 FINDINGS: There is no edema or consolidation. Heart size and pulmonary vascularity are normal. No adenopathy. There is postoperative change in the lower cervical spine. Stomach is somewhat distended with fluid and air. IMPRESSION: No edema or consolidation.  Stomach distended with fluid and air. Electronically Signed   By: Bretta BangWilliam  Woodruff III M.D.   On: 06/03/2016 10:59   Dg Hand Complete Right  Result Date: 06/03/2016 CLINICAL DATA:  Bruising medially EXAM: RIGHT HAND - COMPLETE 3+ VIEW COMPARISON:  None. FINDINGS: Frontal, oblique, and lateral views were obtained. There is no fracture or dislocation. Joint spaces appear unremarkable. No erosive change. No radiopaque foreign body. IMPRESSION: No fracture or dislocation.  No evident arthropathy. Electronically Signed   By: Bretta BangWilliam  Woodruff III M.D.   On: 06/03/2016 11:00    Procedures Procedures (including critical care time)  Medications Ordered in ED Medications  Tdap (BOOSTRIX) injection 0.5 mL (0.5 mLs Intramuscular Given 06/03/16 1045)     Initial Impression / Assessment and Plan / ED Course  I have reviewed the triage vital signs and the  nursing notes.  Pertinent labs & imaging results that were available during my care of the patient were reviewed by me and considered in my medical decision making (see chart for details).  Clinical Course     Pt is a 65 yo femael here for alcohol intoxication.  PT has no complaints, we are awaiting husband.  Normal vitals except oxygen at 92- will get CXR.  Will get alcohol level, baseline labs and xray of the hand.    Patient's husband came, asked for resources for alcohol deteox. Had social work see patient and husabnd.  Personally touched base 2-3 times with husband again, clarifying he had the resources paperwork, and that she was welcome back with any concerns.  If she had seizure or withdrawal symptoms we could treat her.  However, with nomral vitals and labs, and ambulatory, eating and drinking normally, there was no admission criteria currently.  Patient is comfortable, ambulatory, and taking PO at time of discharge.  Patient expressed understanding about return precautions.     Final Clinical Impressions(s) / ED Diagnoses   Final diagnoses:  Alcoholic intoxication without complication [facility])    New Prescriptions New Prescriptions   No medications on file     Fransisco Messmer Randall An, MD 06/03/16 1328

## 2016-06-03 NOTE — ED Notes (Signed)
Spouse reports patient drinks 2 bottles of wine daily since Monday.  States that she doesn't drink everyday but that she goes on binges every once in awhile.  States that he didn't think she was going to stop drinking so he called EMS this morning.

## 2016-06-28 ENCOUNTER — Encounter (HOSPITAL_COMMUNITY): Payer: Self-pay

## 2016-06-28 ENCOUNTER — Inpatient Hospital Stay (HOSPITAL_COMMUNITY)
Admission: EM | Admit: 2016-06-28 | Discharge: 2016-07-05 | DRG: 641 | Disposition: A | Discharge status: Home Health | Payer: Medicare Other | Attending: Family Medicine | Admitting: Family Medicine

## 2016-06-28 DIAGNOSIS — Z23 Encounter for immunization: Secondary | ICD-10-CM | POA: Diagnosis not present

## 2016-06-28 DIAGNOSIS — F329 Major depressive disorder, single episode, unspecified: Secondary | ICD-10-CM | POA: Diagnosis present

## 2016-06-28 DIAGNOSIS — R443 Hallucinations, unspecified: Secondary | ICD-10-CM | POA: Diagnosis present

## 2016-06-28 DIAGNOSIS — B9620 Unspecified Escherichia coli [E. coli] as the cause of diseases classified elsewhere: Secondary | ICD-10-CM | POA: Diagnosis present

## 2016-06-28 DIAGNOSIS — N179 Acute kidney failure, unspecified: Secondary | ICD-10-CM | POA: Diagnosis present

## 2016-06-28 DIAGNOSIS — Y900 Blood alcohol level of less than 20 mg/100 ml: Secondary | ICD-10-CM | POA: Diagnosis present

## 2016-06-28 DIAGNOSIS — K7010 Alcoholic hepatitis without ascites: Secondary | ICD-10-CM | POA: Diagnosis present

## 2016-06-28 DIAGNOSIS — Z66 Do not resuscitate: Secondary | ICD-10-CM | POA: Diagnosis present

## 2016-06-28 DIAGNOSIS — E860 Dehydration: Secondary | ICD-10-CM | POA: Diagnosis present

## 2016-06-28 DIAGNOSIS — Z79899 Other long term (current) drug therapy: Secondary | ICD-10-CM | POA: Diagnosis not present

## 2016-06-28 DIAGNOSIS — J209 Acute bronchitis, unspecified: Secondary | ICD-10-CM | POA: Diagnosis present

## 2016-06-28 DIAGNOSIS — N390 Urinary tract infection, site not specified: Secondary | ICD-10-CM | POA: Diagnosis present

## 2016-06-28 DIAGNOSIS — Z8249 Family history of ischemic heart disease and other diseases of the circulatory system: Secondary | ICD-10-CM

## 2016-06-28 DIAGNOSIS — R7989 Other specified abnormal findings of blood chemistry: Secondary | ICD-10-CM | POA: Diagnosis present

## 2016-06-28 DIAGNOSIS — G40909 Epilepsy, unspecified, not intractable, without status epilepticus: Secondary | ICD-10-CM

## 2016-06-28 DIAGNOSIS — Z7982 Long term (current) use of aspirin: Secondary | ICD-10-CM

## 2016-06-28 DIAGNOSIS — F10239 Alcohol dependence with withdrawal, unspecified: Secondary | ICD-10-CM | POA: Diagnosis present

## 2016-06-28 DIAGNOSIS — G809 Cerebral palsy, unspecified: Secondary | ICD-10-CM | POA: Diagnosis present

## 2016-06-28 DIAGNOSIS — K921 Melena: Secondary | ICD-10-CM | POA: Diagnosis present

## 2016-06-28 DIAGNOSIS — D649 Anemia, unspecified: Secondary | ICD-10-CM | POA: Diagnosis present

## 2016-06-28 DIAGNOSIS — Z881 Allergy status to other antibiotic agents status: Secondary | ICD-10-CM | POA: Diagnosis not present

## 2016-06-28 DIAGNOSIS — F419 Anxiety disorder, unspecified: Secondary | ICD-10-CM | POA: Diagnosis present

## 2016-06-28 DIAGNOSIS — I10 Essential (primary) hypertension: Secondary | ICD-10-CM | POA: Diagnosis present

## 2016-06-28 DIAGNOSIS — R569 Unspecified convulsions: Secondary | ICD-10-CM

## 2016-06-28 DIAGNOSIS — E871 Hypo-osmolality and hyponatremia: Principal | ICD-10-CM | POA: Diagnosis present

## 2016-06-28 DIAGNOSIS — F10939 Alcohol use, unspecified with withdrawal, unspecified: Secondary | ICD-10-CM | POA: Diagnosis present

## 2016-06-28 DIAGNOSIS — F10231 Alcohol dependence with withdrawal delirium: Secondary | ICD-10-CM | POA: Diagnosis present

## 2016-06-28 LAB — COMPREHENSIVE METABOLIC PANEL
ALT: 72 U/L — AB (ref 14–54)
AST: 87 U/L — AB (ref 15–41)
Albumin: 4.1 g/dL (ref 3.5–5.0)
Alkaline Phosphatase: 148 U/L — ABNORMAL HIGH (ref 38–126)
Anion gap: 25 — ABNORMAL HIGH (ref 5–15)
BUN: 45 mg/dL — ABNORMAL HIGH (ref 6–20)
CHLORIDE: 75 mmol/L — AB (ref 101–111)
CO2: 20 mmol/L — AB (ref 22–32)
CREATININE: 1.45 mg/dL — AB (ref 0.44–1.00)
Calcium: 8.6 mg/dL — ABNORMAL LOW (ref 8.9–10.3)
GFR calc non Af Amer: 37 mL/min — ABNORMAL LOW (ref >60)
GFR, EST AFRICAN AMERICAN: 42 mL/min — AB (ref >60)
Glucose, Bld: 80 mg/dL (ref 65–99)
POTASSIUM: 4.5 mmol/L (ref 3.5–5.1)
SODIUM: 120 mmol/L — AB (ref 135–145)
Total Bilirubin: 1.8 mg/dL — ABNORMAL HIGH (ref 0.3–1.2)
Total Protein: 7.3 g/dL (ref 6.5–8.1)

## 2016-06-28 LAB — CBC
HEMATOCRIT: 34.5 % — AB (ref 36.0–46.0)
Hemoglobin: 12.4 g/dL (ref 12.0–15.0)
MCH: 31.2 pg (ref 26.0–34.0)
MCHC: 35.9 g/dL (ref 30.0–36.0)
MCV: 86.9 fL (ref 78.0–100.0)
PLATELETS: 311 10*3/uL (ref 150–400)
RBC: 3.97 MIL/uL (ref 3.87–5.11)
RDW: 14.1 % (ref 11.5–15.5)
WBC: 8.9 10*3/uL (ref 4.0–10.5)

## 2016-06-28 LAB — LIPASE, BLOOD: LIPASE: 56 U/L — AB (ref 11–51)

## 2016-06-28 LAB — MAGNESIUM: Magnesium: 2.7 mg/dL — ABNORMAL HIGH (ref 1.7–2.4)

## 2016-06-28 LAB — POC OCCULT BLOOD, ED: FECAL OCCULT BLD: POSITIVE — AB

## 2016-06-28 LAB — ETHANOL: Alcohol, Ethyl (B): <5 mg/dL (ref <5)

## 2016-06-28 MED ORDER — SODIUM CHLORIDE 0.9 % IV BOLUS (SEPSIS)
2000.0000 mL | Freq: Once | INTRAVENOUS | Status: AC
  Administered 2016-06-28: 2000 mL via INTRAVENOUS

## 2016-06-28 MED ORDER — INFLUENZA VAC SPLIT QUAD 0.5 ML IM SUSY
0.5000 mL | PREFILLED_SYRINGE | INTRAMUSCULAR | Status: AC
  Administered 2016-07-01: 0.5 mL via INTRAMUSCULAR
  Filled 2016-06-28 (×2): qty 0.5

## 2016-06-28 MED ORDER — SODIUM CHLORIDE 0.9 % IV SOLN
INTRAVENOUS | Status: DC
  Administered 2016-06-28: 21:00:00 via INTRAVENOUS
  Administered 2016-06-29: 1000 mL via INTRAVENOUS
  Administered 2016-06-30 – 2016-07-01 (×3): via INTRAVENOUS

## 2016-06-28 MED ORDER — ONDANSETRON HCL 4 MG/2ML IJ SOLN
4.0000 mg | Freq: Once | INTRAMUSCULAR | Status: AC
  Administered 2016-06-28: 4 mg via INTRAVENOUS
  Filled 2016-06-28: qty 2

## 2016-06-28 NOTE — ED Triage Notes (Addendum)
Pt states she did not fall but has been in bed for past weak b/c she is too weak to get up.  Husband has helped her with her depends and keeping her dry.  Pt states she has not been eating for past week.  Vomited every time she tries to drink alcohol.  Last drink 4 hours ago

## 2016-06-28 NOTE — ED Triage Notes (Signed)
Per EMS, pt from home.  Pt c/o drinking alcohol x 3 weeks.  Pt has been vomiting x 1 1/2 weeks.  Pt states she thought she was vomiting blood today.  Pt is a/o x 4.  Vitals:  95/42, hr 103, resp 18, 100% ra, cbg 80

## 2016-06-28 NOTE — H&P (Signed)
History and Physical    Ashley BowensDarlene S Bradley ZOX:[id number] DOB: October 31, 1950 DOA: 06/28/2016  PCP: Neldon LabellaMILLER,[name], MD Consultants:  None Patient coming from: home - lives with husband; NOK: husband, [phone]  Chief Complaint: seizures  HPI: Ashley BowensDarlene S Bene is a 65 y.o. female with medical history significant of ETOH dependence, ETOH withdrawal seizures, DTs, cerebral palsy, HTN, and depression presenting with ETOH withdrawal.  Patient has been drinking for the last 3 1/2 weeks.  2 bottles of Chardonnay daily.  Has h/o ETOH dependence since about age [age]y. Longest period of sobriety was 5 years.  Had a seizure each of the last 2 days, none today.  Has been having hallucinations, audiovisual, for 3 days.  Her last drink was this morning, about 1230-1pm.  Drank about 1/2-2/3 of a regular size wine bottle today.  Very tremulous.  She has been trying to drink less over the last few days, vomiting when she started drinking the last few days.  Also with dark, tarry stools for the last 1-2 weeks.  ED Course: Per Dr. Freida BusmanAllen: She is maintaining appropriately here in has no signs of DTs at this time.  Patient with evidence of hyponatremia here as well as dehydration.Patient's hypertension noted and treated aggressively with IV fluids. Will IV hydrate here and admitted to the medicine service  Review of Systems: As per HPI; otherwise 10 point review of systems reviewed and negative.   Ambulatory Status:  Previously able to walk without assistance but not since she started drinking again  Past Medical History:  Diagnosis Date  . Alcoholism (HCC)   . Complication of anesthesia    hard time waking up  . CP (cerebral palsy) (HCC)   . Depression   . Hypertension   . PONV (postoperative nausea and vomiting)   . Seizures (HCC)    ETOH induced    Past Surgical History:  Procedure Laterality Date  . BLADDER SURGERY    . BREAST SURGERY    . TUBAL LIGATION      Social History   Social History  .  Marital status: Married    Spouse name: N/A  . Number of children: N/A  . Years of education: N/A   Occupational History  . nurse case manager    Social History Main Topics  . Smoking status: Never Smoker  . Smokeless tobacco: Never Used  . Alcohol use Yes     Comment: 1-2 bottles wine per day  . Drug use: No  . Sexual activity: Not on file   Other Topics Concern  . Not on file   Social History Narrative   ++++FH of ETOH problems, "all over the place."    Allergies  Allergen Reactions  . Macrodantin [Nitrofurantoin] Nausea And Vomiting    Family History  Problem Relation Age of Onset  . CAD Mother [phone]  . Lung cancer Father [id number]    Prior to Admission medications   Medication Sig Start Date End Date Taking? Authorizing Provider  aspirin EC 81 MG tablet Take 81 mg by mouth daily.   Yes Historical Provider, MD  FLUoxetine (PROZAC) 20 MG capsule Take 1 capsule (20 mg total) by mouth daily. Patient taking differently: Take 40 mg by mouth daily.  01/29/16  Yes Calvert CantorSaima Rizwan, MD  losartan (COZAAR) 50 MG tablet Take 1 tablet (50 mg total) by mouth daily. 01/29/16  Yes Calvert CantorSaima Rizwan, MD  Multiple Vitamin (MULTIVITAMIN WITH MINERALS) TABS tablet Take 1 tablet by mouth daily. 01/23/16  Yes Jacqlyn KraussSylvester I  Dartha Lodge, MD  saccharomyces boulardii (FLORASTOR) 250 MG capsule Take 250 mg by mouth 2 (two) times daily.   Yes Historical Provider, MD  Elastic Bandages & Supports (T.E.D. BELOW KNEE/LARGE) MISC 1 each by Does not apply route daily. Remove at bedtime - patient must be measured to find appropriate size Patient not taking: Reported on 06/03/2016 01/29/16   Calvert Cantor, MD    Physical Exam: Vitals:   06/28/16 2130 06/28/16 2200 06/28/16 2230 06/29/16 0030  BP: (!) 100/48 (!) 92/50 (!) 86/45 (!) 94/51  Pulse: 96 92 90 86  Resp: 19 26 22 23   Temp:      TempSrc:      SpO2: 100% 100% 100% 97%  Weight:      Height:         General: Appears anxious and somewhat disheveled but is  NAD Eyes:  PERRL, EOMI, normal lids, iris ENT:  grossly normal hearing, lips & tongue, mmm; excoriations along her lips; some apparent recent scarring on tongue from biting during seizure(s) Neck: no LAD, masses or thyromegaly Cardiovascular:  Mild tachycardia, no m/r/g. No LE edema.  Respiratory:  CTA bilaterally, no w/r/r. Normal respiratory effort. Abdomen:  soft, diffusely tender, nd, NABS Skin:  Scattered excoriations along abdomen, lower legs - scabies vs. Trauma, possibly skin picking Musculoskeletal:  grossly normal tone BUE/BLE, good ROM, no bony abnormality Psychiatric: flat affect, speech fluent and appropriate, AOx3, mildly anxious Neurologic:  CN 2-12 grossly intact, moves all extremities in coordinated fashion, sensation intact  Labs on Admission: I have personally reviewed following labs and imaging studies  CBC:  Recent Labs Lab 06/28/16 1647  WBC 8.9  HGB 12.4  HCT 34.5*  MCV 86.9  PLT 311   Basic Metabolic Panel:  Recent Labs Lab 06/28/16 1647  NA 120*  K 4.5  CL 75*  CO2 20*  GLUCOSE 80  BUN 45*  CREATININE 1.45*  CALCIUM 8.6*  MG 2.7*   GFR: Estimated Creatinine Clearance: 34.3 mL/min (by C-G formula based on SCr of 1.45 mg/dL (H)). Liver Function Tests:  Recent Labs Lab 06/28/16 1647  AST 87*  ALT 72*  ALKPHOS 148*  BILITOT 1.8*  PROT 7.3  ALBUMIN 4.1    Recent Labs Lab 06/28/16 1647  LIPASE 56*   No results for input(s): AMMONIA in the last 168 hours. Coagulation Profile: No results for input(s): INR, PROTIME in the last 168 hours. Cardiac Enzymes: No results for input(s): CKTOTAL, CKMB, CKMBINDEX, TROPONINI in the last 168 hours. BNP (last 3 results) No results for input(s): PROBNP in the last 8760 hours. HbA1C: No results for input(s): HGBA1C in the last 72 hours. CBG: No results for input(s): GLUCAP in the last 168 hours. Lipid Profile: No results for input(s): CHOL, HDL, LDLCALC, TRIG, CHOLHDL, LDLDIRECT in the last 72  hours. Thyroid Function Tests: No results for input(s): TSH, T4TOTAL, FREET4, T3FREE, THYROIDAB in the last 72 hours. Anemia Panel: No results for input(s): VITAMINB12, FOLATE, FERRITIN, TIBC, IRON, RETICCTPCT in the last 72 hours. Urine analysis:    Component Value Date/Time   COLORURINE YELLOW 01/26/2016 1045   APPEARANCEUR CLEAR 01/26/2016 1045   LABSPEC 1.011 01/26/2016 1045   PHURINE 7.5 01/26/2016 1045   GLUCOSEU NEGATIVE 01/26/2016 1045   HGBUR TRACE (A) 01/26/2016 1045   BILIRUBINUR NEGATIVE 01/26/2016 1045   KETONESUR NEGATIVE 01/26/2016 1045   PROTEINUR NEGATIVE 01/26/2016 1045   UROBILINOGEN 1.0 09/23/2013 0732   NITRITE NEGATIVE 01/26/2016 1045   LEUKOCYTESUR NEGATIVE 01/26/2016 1045  Creatinine Clearance: Estimated Creatinine Clearance: 34.3 mL/min (by C-G formula based on SCr of 1.45 mg/dL (H)).  Sepsis Labs: @LABRCNTIP (procalcitonin:4,lacticidven:4) )No results found for this or any previous visit (from the past 240 hour(s)).   Radiological Exams on Admission: No results found.  EKG: Independently reviewed.  NSR with rate 99; nonspecific ST changes with no evidence of acute ischemia  Assessment/Plan Principal Problem:   Hyponatremia Active Problems:   Seizures (HCC)   Alcohol withdrawal (HCC)   Acute renal failure (ARF) (HCC)   Melena   Hyponatremia -Acute - currently Na 120, was 141 on 12/1 -Suspect that this is due to beer potomania -Will check urinary and serum Osm as well as urinary sodium and potassium levels -Because the patient is asymptomatic, it is unlikely that this extent of hyponatremia developed overnight so it may be subacute -Will treat with NS at 50 cc/hour and readjust based on the rise of the sodium -Will monitor sodium q4h to attempt to avoid overcorrection (>488mEq/L/day) so as to avoid central pontine myelinolysis  ETOH withdrawal with seizures -Patient with chronic ETOH dependence -Has failed rehab multiple times -Currently  has been drinking nonstop for several weeks -ETOH level negative currently -She is at high risk for complications of withdrawal including seizures, DTs; it would not be at all surprising for this patient to require a Precedex drip to get her through the withdrawal period -SW consult for possible inpatient treatment -Elevated LFTs are likely related to alcoholism, mild alcoholic hepatitis:  lipase 56, AST 87, ALT 72, Bilirubin 1.8 -Patient with h/o prior withdrawal seizures -Seizures are very likely related to ETOH withdrawal but could also be associated with severe hyponatremia -Likely needs 4515-month driving restriction after discharge -Based on seizure despite drinking alcohol (just less than usual due to patient trying to quit), audiovisual hallucinations, and patient's obvious discomfort at the time of eval, her risk for serious DTs is quite high -Will admit to SDU with CIWA protocol -Folate and thiamine ordered -Patient reports desire to quit.  Will likely need long-term treatment program.  . -Will also check UDS.  Acute renal failure -Cr 1.45, previously 0.63 -Unfortunately, due to her hyponatremia, her rehydration will be quite slow and so it may take several days to resolve her renal failure  Melena -Reported melena -Heme positive stools -Hgb 12.4 -Will trend, as there is no intervention available while patient is actively withdrawing (unless she requires emergent intervention) -Will start BID IV PPI   DVT prophylaxis: Lovenox  Code Status: DNR - confirmed with patient/family Family Communication: Husband present throughout  Disposition Plan:  To be determined Consults called: None - but may need PCCM for Precedex infusion Admission status: Admit - It is my clinical opinion that admission to INPATIENT is reasonable and necessary because this patient will require at least 2 midnights in the hospital to treat this condition based on the medical complexity of the problems presented.   Given the aforementioned information, the predictability of an adverse outcome is felt to be significant.    Jonah BlueJennifer Roseline Ebarb MD [facility]  If 7PM-7AM, please contact night-coverage [email or web address] Password [email or web address]  06/29/2016, 1:34 AM

## 2016-06-28 NOTE — ED Provider Notes (Addendum)
[facility]-EMERGENCY DEPT Provider Note   CSN: [id number] Arrival date & time: 06/28/16  1449     History   Chief Complaint Chief Complaint  Patient presents with  . Emesis  . Alcohol Problem    HPI Ashley Bradley is a 64 y.o. female.  65 y/o female w/ h/o etoh abuse here with 1.5 weeks of emesis and drinking 1 bottle of wine/day Today, noted coffee grounds in her vomit and had a dark stool Some weakness when standing, denies syncope or recent falls No illicit drug use No si/hi Has had poor nutrition and drinking etoh makes her sx worse No abd pain or fever Called ems and transported here      Past Medical History:  Diagnosis Date  . Alcoholism (HCC)   . Complication of anesthesia    hard time waking up  . CP (cerebral palsy) (HCC)   . Depression   . Hypertension   . PONV (postoperative nausea and vomiting)   . Seizures (HCC)    ETOH induced    Patient Active Problem List   Diagnosis Date Noted  . Syncope 01/26/2016  . Prolonged Q-T interval on ECG 01/26/2016  . Hypokalemia 01/26/2016  . History of Clostridium difficile colitis 01/26/2016  . Elevated troponin   . Alcohol use disorder, moderate, in early remission, dependence (HCC) 01/21/2016  . Acute kidney injury (HCC) 01/16/2016  . Alcohol withdrawal (HCC) 01/16/2016  . Seizures (HCC) 09/24/2013  . UTI (urinary tract infection) 09/24/2013  . Hyponatremia 09/20/2013  . Major depression 03/19/2013  . Alcohol intoxication in relapsed alcoholic (HCC) 03/19/2013    Past Surgical History:  Procedure Laterality Date  . BLADDER SURGERY    . BREAST SURGERY    . TUBAL LIGATION      OB History    No data available       Home Medications    Prior to Admission medications   Medication Sig Start Date End Date Taking? Authorizing Provider  aspirin EC 81 MG tablet Take 81 mg by mouth daily.   Yes Historical Provider, MD  FLUoxetine (PROZAC) 20 MG capsule Take 1 capsule (20 mg total) by mouth  daily. Patient taking differently: Take 40 mg by mouth daily.  01/29/16  Yes Calvert Cantor, MD  losartan (COZAAR) 50 MG tablet Take 1 tablet (50 mg total) by mouth daily. 01/29/16  Yes Calvert Cantor, MD  Multiple Vitamin (MULTIVITAMIN WITH MINERALS) TABS tablet Take 1 tablet by mouth daily. 01/23/16  Yes Barnetta Chapel, MD  saccharomyces boulardii (FLORASTOR) 250 MG capsule Take 250 mg by mouth 2 (two) times daily.   Yes Historical Provider, MD  Elastic Bandages & Supports (T.E.D. BELOW KNEE/LARGE) MISC 1 each by Does not apply route daily. Remove at bedtime - patient must be measured to find appropriate size Patient not taking: Reported on 06/03/2016 01/29/16   Calvert Cantor, MD    Family History History reviewed. No pertinent family history.  Social History Social History  Substance Use Topics  . Smoking status: Never Smoker  . Smokeless tobacco: Never Used  . Alcohol use Yes     Comment: 1 bottle wine per day x 2 weeks - 09/20/13     Allergies   Macrodantin [nitrofurantoin]   Review of Systems Review of Systems  All other systems reviewed and are negative.    Physical Exam Updated Vital Signs BP (!) 76/41   Pulse 106   Temp 98.3 F (36.8 C) (Oral)   Resp 18   Ht  5\' 5"  (1.651 m)   Wt 63.5 kg   SpO2 97%   BMI 23.30 kg/m   Physical Exam  Constitutional: She is oriented to person, place, and time. She appears well-developed and well-nourished.  Non-toxic appearance. No distress.  HENT:  Head: Normocephalic and atraumatic.  Eyes: Conjunctivae, EOM and lids are normal. Pupils are equal, round, and reactive to light.  Neck: Normal range of motion. Neck supple. No tracheal deviation present. No thyroid mass present.  Cardiovascular: Normal rate, regular rhythm and normal heart sounds.  Exam reveals no gallop.   No murmur heard. Pulmonary/Chest: Effort normal and breath sounds normal. No stridor. No respiratory distress. She has no decreased breath sounds. She has no  wheezes. She has no rhonchi. She has no rales.  Abdominal: Soft. Normal appearance and bowel sounds are normal. She exhibits no distension. There is no tenderness. There is no rigidity, no rebound, no guarding and no CVA tenderness.  Musculoskeletal: Normal range of motion. She exhibits no edema or tenderness.  Neurological: She is alert and oriented to person, place, and time. She has normal strength. No cranial nerve deficit or sensory deficit. GCS eye subscore is 4. GCS verbal subscore is 5. GCS motor subscore is 6.  Skin: Skin is warm and dry. No abrasion and no rash noted. There is pallor.  Psychiatric: Her speech is normal and behavior is normal. Her affect is blunt.  Nursing note and vitals reviewed.    ED Treatments / Results  Labs (all labs ordered are listed, but only abnormal results are displayed) Labs Reviewed  LIPASE, BLOOD  COMPREHENSIVE METABOLIC PANEL  CBC  URINALYSIS, ROUTINE W REFLEX MICROSCOPIC  ETHANOL  RAPID URINE DRUG SCREEN, HOSP PERFORMED  MAGNESIUM  POC OCCULT BLOOD, ED  TYPE AND SCREEN    EKG  EKG Interpretation  Date/Time:  Tuesday June 28 2016 17:03:35 EST Ventricular Rate:  99 PR Interval:    QRS Duration: 91 QT Interval:  378 QTC Calculation: 486 R Axis:   62 Text Interpretation:  Sinus rhythm Nonspecific T abnrm, anterolateral leads Borderline prolonged QT interval Confirmed by Freida BusmanALLEN  MD, Adreanna Fickel ([id number]) on 06/28/2016 5:32:05 PM       Radiology No results found.  Procedures Procedures (including critical care time)  Medications Ordered in ED Medications  0.9 %  sodium chloride infusion (not administered)  sodium chloride 0.9 % bolus 2,000 mL (2,000 mLs Intravenous New Bag/Given 06/28/16 1700)     Initial Impression / Assessment and Plan / ED Course  I have reviewed the triage vital signs and the nursing notes.  Pertinent labs & imaging results that were available during my care of the patient were reviewed by me and  considered in my medical decision making (see chart for details).  Clinical Course    She is maintaining appropriately here in has no signs of DTs at this time. Patient with evidence of hyponatremia here as well as dehydration.Patient's hypertension noted and treated aggressively with IV fluids. Will IV hydrate here and admitted to the medicine service CRITICAL CARE Performed by: Toy BakerALLEN,Detta Mellin T Total critical care time: 50 minutes Critical care time was exclusive of separately billable procedures and treating other patients. Critical care was necessary to treat or prevent imminent or life-threatening deterioration. Critical care was time spent personally by me on the following activities: development of treatment plan with patient and/or surrogate as well as nursing, discussions with consultants, evaluation of patient's response to treatment, examination of patient, obtaining history from patient or surrogate,  ordering and performing treatments and interventions, ordering and review of laboratory studies, ordering and review of radiographic studies, pulse oximetry and re-evaluation of patient's condition. Final Clinical Impressions(s) / ED Diagnoses   Final diagnoses:  None    New Prescriptions New Prescriptions   No medications on file     Lorre NickAnthony Jaelynne Hockley, MD 06/28/16 1854    Lorre NickAnthony Vyolet Sakuma, MD 06/28/16 1909    Lorre NickAnthony Gabriela Irigoyen, MD 06/28/16 1910

## 2016-06-28 NOTE — ED Notes (Signed)
Brief changed; attempt to use bedpan with no void.

## 2016-06-28 NOTE — ED Notes (Signed)
Pt made aware of need for urine specimen 

## 2016-06-29 DIAGNOSIS — N179 Acute kidney failure, unspecified: Secondary | ICD-10-CM | POA: Diagnosis present

## 2016-06-29 DIAGNOSIS — N390 Urinary tract infection, site not specified: Secondary | ICD-10-CM | POA: Diagnosis not present

## 2016-06-29 DIAGNOSIS — F10239 Alcohol dependence with withdrawal, unspecified: Secondary | ICD-10-CM

## 2016-06-29 DIAGNOSIS — K921 Melena: Secondary | ICD-10-CM | POA: Diagnosis present

## 2016-06-29 DIAGNOSIS — E871 Hypo-osmolality and hyponatremia: Principal | ICD-10-CM

## 2016-06-29 LAB — BASIC METABOLIC PANEL
ANION GAP: 15 (ref 5–15)
ANION GAP: 16 — AB (ref 5–15)
ANION GAP: 11 (ref 5–15)
ANION GAP: 9 (ref 5–15)
Anion gap: 17 — ABNORMAL HIGH (ref 5–15)
BUN: 44 mg/dL — ABNORMAL HIGH (ref 6–20)
BUN: 44 mg/dL — ABNORMAL HIGH (ref 6–20)
BUN: 41 mg/dL — ABNORMAL HIGH (ref 6–20)
BUN: 35 mg/dL — ABNORMAL HIGH (ref 6–20)
BUN: 32 mg/dL — ABNORMAL HIGH (ref 6–20)
CALCIUM: 7 mg/dL — AB (ref 8.9–10.3)
CHLORIDE: 86 mmol/L — AB (ref 101–111)
CHLORIDE: 91 mmol/L — AB (ref 101–111)
CO2: 19 mmol/L — AB (ref 22–32)
CO2: 19 mmol/L — AB (ref 22–32)
CO2: 19 mmol/L — ABNORMAL LOW (ref 22–32)
CO2: 22 mmol/L (ref 22–32)
CO2: 22 mmol/L (ref 22–32)
Calcium: 6.9 mg/dL — ABNORMAL LOW (ref 8.9–10.3)
Calcium: 6.9 mg/dL — ABNORMAL LOW (ref 8.9–10.3)
Calcium: 7.2 mg/dL — ABNORMAL LOW (ref 8.9–10.3)
Calcium: 7.3 mg/dL — ABNORMAL LOW (ref 8.9–10.3)
Chloride: 90 mmol/L — ABNORMAL LOW (ref 101–111)
Chloride: 92 mmol/L — ABNORMAL LOW (ref 101–111)
Chloride: 95 mmol/L — ABNORMAL LOW (ref 101–111)
Creatinine, Ser: 1.29 mg/dL — ABNORMAL HIGH (ref 0.44–1.00)
Creatinine, Ser: 1.2 mg/dL — ABNORMAL HIGH (ref 0.44–1.00)
Creatinine, Ser: 1.15 mg/dL — ABNORMAL HIGH (ref 0.44–1.00)
Creatinine, Ser: 0.89 mg/dL (ref 0.44–1.00)
Creatinine, Ser: 0.8 mg/dL (ref 0.44–1.00)
GFR calc Af Amer: 56 mL/min — ABNORMAL LOW (ref >60)
GFR calc non Af Amer: 42 mL/min — ABNORMAL LOW (ref >60)
GFR calc non Af Amer: 46 mL/min — ABNORMAL LOW (ref >60)
GFR calc non Af Amer: >60 mL/min (ref >60)
GFR, EST AFRICAN AMERICAN: 49 mL/min — AB (ref >60)
GFR, EST AFRICAN AMERICAN: 53 mL/min — AB (ref >60)
GFR, EST NON AFRICAN AMERICAN: 48 mL/min — AB (ref >60)
GLUCOSE: 90 mg/dL (ref 65–99)
GLUCOSE: 106 mg/dL — AB (ref 65–99)
GLUCOSE: 102 mg/dL — AB (ref 65–99)
GLUCOSE: 123 mg/dL — AB (ref 65–99)
Glucose, Bld: 156 mg/dL — ABNORMAL HIGH (ref 65–99)
POTASSIUM: 3.9 mmol/L (ref 3.5–5.1)
POTASSIUM: 3.6 mmol/L (ref 3.5–5.1)
POTASSIUM: 3.8 mmol/L (ref 3.5–5.1)
POTASSIUM: 3.5 mmol/L (ref 3.5–5.1)
POTASSIUM: 3.3 mmol/L — AB (ref 3.5–5.1)
SODIUM: 125 mmol/L — AB (ref 135–145)
SODIUM: 125 mmol/L — AB (ref 135–145)
Sodium: 122 mmol/L — ABNORMAL LOW (ref 135–145)
Sodium: 125 mmol/L — ABNORMAL LOW (ref 135–145)
Sodium: 126 mmol/L — ABNORMAL LOW (ref 135–145)

## 2016-06-29 LAB — CBC
HEMATOCRIT: 24.5 % — AB (ref 36.0–46.0)
Hemoglobin: 8.8 g/dL — ABNORMAL LOW (ref 12.0–15.0)
MCH: 31.7 pg (ref 26.0–34.0)
MCHC: 35.9 g/dL (ref 30.0–36.0)
MCV: 88.1 fL (ref 78.0–100.0)
Platelets: 235 10*3/uL (ref 150–400)
RBC: 2.78 MIL/uL — ABNORMAL LOW (ref 3.87–5.11)
RDW: 14.5 % (ref 11.5–15.5)
WBC: 6.4 10*3/uL (ref 4.0–10.5)

## 2016-06-29 LAB — URINALYSIS, ROUTINE W REFLEX MICROSCOPIC
Bilirubin Urine: NEGATIVE
GLUCOSE, UA: NEGATIVE mg/dL
Ketones, ur: 20 mg/dL — AB
Leukocytes, UA: NEGATIVE
NITRITE: POSITIVE — AB
PROTEIN: NEGATIVE mg/dL
RBC / HPF: NONE SEEN RBC/hpf (ref 0–5)
Specific Gravity, Urine: 1.013 (ref 1.005–1.030)
pH: 5 (ref 5.0–8.0)

## 2016-06-29 LAB — NA AND K (SODIUM & POTASSIUM), RAND UR: POTASSIUM UR: 26 mmol/L

## 2016-06-29 LAB — OSMOLALITY, URINE: Osmolality, Ur: 436 mOsm/kg (ref 300–900)

## 2016-06-29 LAB — PHOSPHORUS: PHOSPHORUS: 2.8 mg/dL (ref 2.5–4.6)

## 2016-06-29 LAB — MAGNESIUM: MAGNESIUM: 2.2 mg/dL (ref 1.7–2.4)

## 2016-06-29 LAB — OSMOLALITY: OSMOLALITY: 280 mosm/kg (ref 275–295)

## 2016-06-29 MED ORDER — POTASSIUM CHLORIDE CRYS ER 20 MEQ PO TBCR
40.0000 meq | EXTENDED_RELEASE_TABLET | Freq: Once | ORAL | Status: AC
  Administered 2016-06-29: 40 meq via ORAL
  Filled 2016-06-29: qty 2

## 2016-06-29 MED ORDER — ADULT MULTIVITAMIN W/MINERALS CH
1.0000 | ORAL_TABLET | Freq: Every day | ORAL | Status: DC
  Administered 2016-06-29 – 2016-07-05 (×7): 1 via ORAL
  Filled 2016-06-29 (×7): qty 1

## 2016-06-29 MED ORDER — FOLIC ACID 1 MG PO TABS
1.0000 mg | ORAL_TABLET | Freq: Every day | ORAL | Status: DC
  Administered 2016-06-29 – 2016-07-05 (×7): 1 mg via ORAL
  Filled 2016-06-29 (×7): qty 1

## 2016-06-29 MED ORDER — OLOPATADINE HCL 0.1 % OP SOLN
1.0000 [drp] | Freq: Two times a day (BID) | OPHTHALMIC | Status: DC
  Administered 2016-06-30 – 2016-07-05 (×11): 1 [drp] via OPHTHALMIC
  Filled 2016-06-29: qty 5

## 2016-06-29 MED ORDER — FOLIC ACID 5 MG/ML IJ SOLN
1.0000 mg | Freq: Every day | INTRAMUSCULAR | Status: DC
  Filled 2016-06-29: qty 0.2

## 2016-06-29 MED ORDER — SODIUM CHLORIDE 0.9% FLUSH
3.0000 mL | Freq: Two times a day (BID) | INTRAVENOUS | Status: DC
Start: 2016-06-29 — End: 2016-07-05
  Administered 2016-06-30 – 2016-07-05 (×11): 3 mL via INTRAVENOUS

## 2016-06-29 MED ORDER — ONDANSETRON HCL 4 MG/2ML IJ SOLN: 4.0000 mg | Freq: Four times a day (QID) | INTRAMUSCULAR | Status: DC | PRN

## 2016-06-29 MED ORDER — LORAZEPAM 2 MG/ML IJ SOLN
2.0000 mg | INTRAMUSCULAR | Status: DC | PRN
  Administered 2016-06-29: 2 mg via INTRAVENOUS
  Filled 2016-06-29: qty 1

## 2016-06-29 MED ORDER — ASPIRIN EC 81 MG PO TBEC
81.0000 mg | DELAYED_RELEASE_TABLET | Freq: Every day | ORAL | Status: DC
  Administered 2016-06-29 – 2016-07-05 (×7): 81 mg via ORAL
  Filled 2016-06-29 (×8): qty 1

## 2016-06-29 MED ORDER — FLUOXETINE HCL 20 MG PO CAPS
40.0000 mg | ORAL_CAPSULE | Freq: Every day | ORAL | Status: DC
  Administered 2016-06-29 – 2016-07-05 (×7): 40 mg via ORAL
  Filled 2016-06-29 (×7): qty 2

## 2016-06-29 MED ORDER — THIAMINE HCL 100 MG/ML IJ SOLN
100.0000 mg | Freq: Every day | INTRAMUSCULAR | Status: DC
  Administered 2016-06-29 – 2016-06-30 (×2): 100 mg via INTRAVENOUS
  Filled 2016-06-29 (×2): qty 2

## 2016-06-29 MED ORDER — OLOPATADINE HCL 0.1 % OP SOLN
1.0000 [drp] | Freq: Two times a day (BID) | OPHTHALMIC | Status: DC
  Filled 2016-06-29 (×2): qty 5

## 2016-06-29 MED ORDER — ENOXAPARIN SODIUM 40 MG/0.4ML ~~LOC~~ SOLN
40.0000 mg | SUBCUTANEOUS | Status: DC
  Filled 2016-06-29: qty 0.4

## 2016-06-29 MED ORDER — PHENOL 1.4 % MT LIQD
1.0000 | OROMUCOSAL | Status: DC | PRN
  Administered 2016-06-30 (×2): 1 via OROMUCOSAL
  Filled 2016-06-29: qty 177

## 2016-06-29 MED ORDER — PANTOPRAZOLE SODIUM 40 MG IV SOLR
40.0000 mg | Freq: Two times a day (BID) | INTRAVENOUS | Status: DC
  Administered 2016-06-29 – 2016-07-05 (×14): 40 mg via INTRAVENOUS
  Filled 2016-06-29 (×14): qty 40

## 2016-06-29 MED ORDER — ACETAMINOPHEN 650 MG RE SUPP
650.0000 mg | Freq: Four times a day (QID) | RECTAL | Status: DC | PRN
Start: 2016-06-29 — End: 2016-07-05

## 2016-06-29 MED ORDER — ONDANSETRON HCL 4 MG PO TABS: 4.0000 mg | ORAL_TABLET | Freq: Four times a day (QID) | ORAL | Status: DC | PRN

## 2016-06-29 MED ORDER — ACETAMINOPHEN 325 MG PO TABS
650.0000 mg | ORAL_TABLET | Freq: Four times a day (QID) | ORAL | Status: DC | PRN
  Administered 2016-07-02: 650 mg via ORAL
  Filled 2016-06-29: qty 2

## 2016-06-29 NOTE — ED Notes (Signed)
Lab sent 

## 2016-06-29 NOTE — Progress Notes (Signed)
[facility]  PROGRESS NOTE  Ashley Bradley [id number] DOB: [birth year] DOA: 06/28/2016 PCP: Neldon LabellaMILLER,[name], MD   Brief HPI:   65 y.o. female with medical history significant of ETOH dependence, ETOH withdrawal seizures, DTs, cerebral palsy, HTN, and depression presenting with ETOH withdrawal.  Patient has been drinking for the last 3 1/2 weeks.  2 bottles of Chardonnay daily.  Has h/o ETOH dependence since about age [age]y. Longest period of sobriety was 5 years.  Had a seizure each of the last 2 days, none today.  Has been having hallucinations, audiovisual, for 3 days.  Her last drink was this morning, about 1230-1pm.  Drank about 1/2-2/3 of a regular size wine bottle today.  Very tremulous.  She has been trying to drink less over the last few days, vomiting when she started drinking the last few days.  Also with dark, tarry stools for the last 1-2 weeks.    Subjective   Patient complains of tremors and anxiety.   Assessment/Plan:     1. Hyponatremia- patient presented with sodium 120 which has now improved to 126. Neck from dehydration. Urine osmolarity 436, serum osmolality is 280. Will follow BMP in a.m. 2. Alcohol withdrawal with history of seizures- no seizures in the hospital, continue CIWA protocol. 3. Acute kidney injury- patient's creatinine 1.45 previous creatinine 0.60 likely from dehydration. Continue IV fluids. Follow BMP in a.m. 4. Melena- patient reported melena with heme-positive stool, hemoglobin dropped to 8.8. Check CBC in a.m. likely need GI consultation if hemoglobin continues to drop.    DVT prophylaxis: SCd's  Code Status: DO NOT RESUSCITATE  Family Communication: No family present at bedside   Disposition Plan: To be determined   Consultants:  None  Procedures:  None  Continuous infusions . sodium chloride 1,000 mL (06/29/16 1414)      Antibiotics:   Anti-infectives    None       Objective   Vitals:   06/29/16 1623  06/29/16 1630 06/29/16 1700 06/29/16 1752  BP: 101/57 (!) 96/51 (!) 92/52 (!) 111/57  Pulse: 91 85 88 92  Resp: 20 24 23 20   Temp:    99.5 F (37.5 C)  TempSrc:    Oral  SpO2: 94% 93% 94% 100%  Weight:      Height:        Intake/Output Summary (Last 24 hours) at 06/29/16 1849 Last data filed at 06/29/16 1400  Gross per 24 hour  Intake             3000 ml  Output                5 ml  Net             2995 ml   Filed Weights   06/28/16 1651  Weight: 63.5 kg (140 lb)     Physical Examination:  General exam: Appears calm and comfortable. Respiratory system: Clear to auscultation. Respiratory effort normal. Cardiovascular system:  RRR. No  murmurs, rubs, gallops. No pedal edema. GI system: Abdomen is nondistended, soft and nontender. No organomegaly.  Central nervous system. No focal neurological deficits. 5 x 5 power in all extremities. Skin: No rashes, lesions or ulcers. Psychiatry: Alert, oriented x 3.Judgement and insight appear normal. Affect normal.    Data Reviewed: I have personally reviewed following labs and imaging studies  CBG: No results for input(s): GLUCAP in the last 168 hours.  CBC:  Recent Labs Lab 06/28/16 1647 06/29/16 1040  WBC 8.9 6.4  HGB 12.4 8.8*  HCT 34.5* 24.5*  MCV 86.9 88.1  PLT 311 235    Basic Metabolic Panel:  Recent Labs Lab 06/28/16 1647 06/29/16 0140 06/29/16 0654 06/29/16 0934 06/29/16 1500 06/29/16 1711  NA 120* 122* 125* 125* 125* 126*  K 4.5 3.9 3.6 3.8 3.5 3.3*  CL 75* 86* 91* 90* 92* 95*  CO2 20* 19* 19* 19* 22 22  GLUCOSE 80 90 106* 102* 156* 123*  BUN 45* 44* 44* 41* 35* 32*  CREATININE 1.45* 1.29* 1.20* 1.15* 0.89 0.80  CALCIUM 8.6* 6.9* 6.9* 7.2* 7.3* 7.0*  MG 2.7*  --  2.2  --   --   --   PHOS  --   --  2.8  --   --   --     No results found for this or any previous visit (from the past 240 hour(s)).   Liver Function Tests:  Recent Labs Lab 06/28/16 1647  AST 87*  ALT 72*  ALKPHOS 148*   BILITOT 1.8*  PROT 7.3  ALBUMIN 4.1    Recent Labs Lab 06/28/16 1647  LIPASE 56*   No results for input(s): AMMONIA in the last 168 hours.  Cardiac Enzymes: No results for input(s): CKTOTAL, CKMB, CKMBINDEX, TROPONINI in the last 168 hours. BNP (last 3 results) No results for input(s): BNP in the last 8760 hours.  ProBNP (last 3 results) No results for input(s): PROBNP in the last 8760 hours.    Studies: No results found.  Scheduled Meds: . aspirin EC  81 mg Oral Daily  . enoxaparin (LOVENOX) injection  40 mg Subcutaneous Q24H  . FLUoxetine  40 mg Oral Daily  . folic acid  1 mg Oral Daily  . Influenza vac split quadrivalent PF  0.5 mL Intramuscular Tomorrow-1000  . multivitamin with minerals  1 tablet Oral Daily  . olopatadine  1 drop Both Eyes BID  . pantoprazole (PROTONIX) IV  40 mg Intravenous Q12H  . sodium chloride flush  3 mL Intravenous Q12H  . thiamine  100 mg Intravenous Daily      Time spent: 25 min  [facility]es[facility],Cataldo Cosgriff S   [facility] Pager [phone]. If 7PM-7AM, please contact night-coverage at [email or web address], Office  [phone]  password [email or web address] 06/29/2016, 6:49 PM  LOS: 1 day

## 2016-06-29 NOTE — ED Notes (Signed)
Pt made aware UA needed. Unable to provide at this time. Will check back.

## 2016-06-29 NOTE — ED Notes (Signed)
Pt up to the bathroom Pt has abnormal gait due to cerebral palsy Pt more unsteady now, requiring use of the STEADY

## 2016-06-29 NOTE — ED Notes (Signed)
Called Lab to add the labs that are due now, lab is able to add the phosphorus and magnesium.  Will collect CBC with next blood draw at 09:30

## 2016-06-29 NOTE — ED Notes (Signed)
On previous shift staff took pt to the bathroom and attempted to collect urine.  Pt had BM in urine collection hat.  Will attempt to collect urine from patient during next bathroom visit

## 2016-06-29 NOTE — ED Notes (Signed)
Lab called to tell that BMP tube that was sent was not enough and needs to be recollected. RN Alvino ChapelEllen made aware.

## 2016-06-29 NOTE — ED Notes (Signed)
Paged Dr. Sharl MaLama, notified him that pt was having drainage from her eyes.  Dr. Sharl MaLama also mentioned that he would change pt to telemetry bed, await change in computer and new orders for eye drainage

## 2016-06-29 NOTE — ED Notes (Signed)
Pt noted to have crusted drainage on both eyes, increased on right eye.  Warm soaks to get this off of her eye.  Will notify MD

## 2016-06-30 DIAGNOSIS — E871 Hypo-osmolality and hyponatremia: Secondary | ICD-10-CM | POA: Diagnosis not present

## 2016-06-30 DIAGNOSIS — K921 Melena: Secondary | ICD-10-CM | POA: Diagnosis not present

## 2016-06-30 DIAGNOSIS — R569 Unspecified convulsions: Secondary | ICD-10-CM | POA: Diagnosis not present

## 2016-06-30 DIAGNOSIS — F10239 Alcohol dependence with withdrawal, unspecified: Secondary | ICD-10-CM | POA: Diagnosis not present

## 2016-06-30 LAB — BASIC METABOLIC PANEL
Anion gap: 11 (ref 5–15)
BUN: 23 mg/dL — ABNORMAL HIGH (ref 6–20)
CALCIUM: 7.8 mg/dL — AB (ref 8.9–10.3)
CHLORIDE: 97 mmol/L — AB (ref 101–111)
CO2: 21 mmol/L — ABNORMAL LOW (ref 22–32)
CREATININE: 0.63 mg/dL (ref 0.44–1.00)
GFR calc non Af Amer: >60 mL/min (ref >60)
Glucose, Bld: 111 mg/dL — ABNORMAL HIGH (ref 65–99)
Potassium: 3.8 mmol/L (ref 3.5–5.1)
SODIUM: 129 mmol/L — AB (ref 135–145)

## 2016-06-30 LAB — GLUCOSE, CAPILLARY
GLUCOSE-CAPILLARY: 128 mg/dL — AB (ref 65–99)
Glucose-Capillary: 90 mg/dL (ref 65–99)

## 2016-06-30 MED ORDER — VITAMIN B-1 100 MG PO TABS
100.0000 mg | ORAL_TABLET | Freq: Every day | ORAL | Status: DC
  Administered 2016-07-01 – 2016-07-05 (×5): 100 mg via ORAL
  Filled 2016-06-30 (×5): qty 1

## 2016-06-30 MED ORDER — ENSURE ENLIVE PO LIQD
237.0000 mL | Freq: Two times a day (BID) | ORAL | Status: DC
  Administered 2016-07-01 – 2016-07-05 (×7): 237 mL via ORAL

## 2016-06-30 NOTE — Progress Notes (Signed)
[facility]  PROGRESS NOTE  Ashley BowensDarlene S Gotsch ZOX:[id number] DOB: Jan 14, 1951 DOA: 06/28/2016 PCP: Neldon LabellaMILLER,[name], MD   Brief HPI:   65 y.o. female with medical history significant of ETOH dependence, ETOH withdrawal seizures, DTs, cerebral palsy, HTN, and depression presenting with ETOH withdrawal.  Patient has been drinking for the last 3 1/2 weeks.  2 bottles of Chardonnay daily.  Has h/o ETOH dependence since about age [age]y. Longest period of sobriety was 5 years.  Had a seizure each of the last 2 days, none today.  Has been having hallucinations, audiovisual, for 3 days.  Her last drink was this morning, about 1230-1pm.  Drank about 1/2-2/3 of a regular size wine bottle today.  Very tremulous.  She has been trying to drink less over the last few days, vomiting when she started drinking the last few days.  Also with dark, tarry stools for the last 1-2 weeks.    Subjective   Patient complains of tremors and anxiety.   Assessment/Plan:     1. Hyponatremia- patient presented with sodium 120 which has now improved to 129. Likely from  dehydration. Urine osmolarity 436, serum osmolality is 280. Will follow BMP in a.m. 2. Alcohol withdrawal with history of seizures- no seizures in the hospital, continue CIWA protocol. 3. Acute kidney injury- patient's creatinine 1.45 previous creatinine 0.60 likely from dehydration. Continue IV fluids. Follow BMP in a.m. 4. Melena- patient reported melena with heme-positive stool, hemoglobin dropped to 8.8. Check CBC in a.m. likely need GI consultation if hemoglobin continues to drop.    DVT prophylaxis: SCd's  Code Status: DO NOT RESUSCITATE  Family Communication: No family present at bedside   Disposition Plan: To be determined   Consultants:  None  Procedures:  None  Continuous infusions . sodium chloride 50 mL/hr at 06/30/16 0420      Antibiotics:   Anti-infectives    None       Objective   Vitals:   06/29/16 1700  06/29/16 1752 06/29/16 2134 06/30/16 0415  BP: (!) 92/52 (!) 111/57 (!) 97/53 (!) 103/91  Pulse: 88 92 (!) 101 98  Resp: 23 20 20 16   Temp:  99.5 F (37.5 C) 98.2 F (36.8 C) 98.6 F (37 C)  TempSrc:  Oral Axillary Oral  SpO2: 94% 100% 98% 100%  Weight:      Height:        Intake/Output Summary (Last 24 hours) at 06/30/16 1402 Last data filed at 06/30/16 0949  Gross per 24 hour  Intake           948.33 ml  Output                0 ml  Net           948.33 ml   Filed Weights   06/28/16 1651  Weight: 63.5 kg (140 lb)     Physical Examination:  General exam: Appears calm and comfortable. Respiratory system: Clear to auscultation. Respiratory effort normal. Cardiovascular system:  RRR. No  murmurs, rubs, gallops. No pedal edema. GI system: Abdomen is nondistended, soft and nontender. No organomegaly.  Central nervous system. No focal neurological deficits. 5 x 5 power in all extremities. Skin: No rashes, lesions or ulcers. Psychiatry: Alert, oriented x 3.Judgement and insight appear normal. Affect normal.    Data Reviewed: I have personally reviewed following labs and imaging studies  CBG:  Recent Labs Lab 06/30/16 0755 06/30/16 1152  GLUCAP 90 128*    CBC:  Recent  Labs Lab 06/28/16 1647 06/29/16 1040  WBC 8.9 6.4  HGB 12.4 8.8*  HCT 34.5* 24.5*  MCV 86.9 88.1  PLT 311 235    Basic Metabolic Panel:  Recent Labs Lab 06/28/16 1647  06/29/16 0654 06/29/16 0934 06/29/16 1500 06/29/16 1711 06/30/16 0450  NA 120*  < > 125* 125* 125* 126* 129*  K 4.5  < > 3.6 3.8 3.5 3.3* 3.8  CL 75*  < > 91* 90* 92* 95* 97*  CO2 20*  < > 19* 19* 22 22 21*  GLUCOSE 80  < > 106* 102* 156* 123* 111*  BUN 45*  < > 44* 41* 35* 32* 23*  CREATININE 1.45*  < > 1.20* 1.15* 0.89 0.80 0.63  CALCIUM 8.6*  < > 6.9* 7.2* 7.3* 7.0* 7.8*  MG 2.7*  --  2.2  --   --   --   --   PHOS  --   --  2.8  --   --   --   --   < > = values in this interval not displayed.  No results  found for this or any previous visit (from the past 240 hour(s)).   Liver Function Tests:  Recent Labs Lab 06/28/16 1647  AST 87*  ALT 72*  ALKPHOS 148*  BILITOT 1.8*  PROT 7.3  ALBUMIN 4.1    Recent Labs Lab 06/28/16 1647  LIPASE 56*   No results for input(s): AMMONIA in the last 168 hours.  Cardiac Enzymes: No results for input(s): CKTOTAL, CKMB, CKMBINDEX, TROPONINI in the last 168 hours. BNP (last 3 results) No results for input(s): BNP in the last 8760 hours.  ProBNP (last 3 results) No results for input(s): PROBNP in the last 8760 hours.    Studies: No results found.  Scheduled Meds: . aspirin EC  81 mg Oral Daily  . FLUoxetine  40 mg Oral Daily  . folic acid  1 mg Oral Daily  . Influenza vac split quadrivalent PF  0.5 mL Intramuscular Tomorrow-1000  . multivitamin with minerals  1 tablet Oral Daily  . olopatadine  1 drop Both Eyes BID  . pantoprazole (PROTONIX) IV  40 mg Intravenous Q12H  . sodium chloride flush  3 mL Intravenous Q12H  . thiamine  100 mg Intravenous Daily      Time spent: 25 min  [facility] LaserAMA,Terasa Orsini S   [facility] Pager [phone]. If 7PM-7AM, please contact night-coverage at [email or web address], Office  [phone]  password [email or web address] 06/30/2016, 2:02 PM  LOS: 1 day

## 2016-06-30 NOTE — Progress Notes (Signed)
Initial Nutrition Assessment  DOCUMENTATION CODES:   Severe malnutrition in context of social or environmental circumstances  INTERVENTION:  Monitor magnesium, potassium, and phosphorus daily for at least 3 days, MD to replete as needed, as pt is at risk for refeeding syndrome given severe malnutrition, no intake for 14 days PTA per report.  Provide Ensure Enlive po BID, each supplement provides 350 kcal and 20 grams of protein.  Continue multivitamins with minerals, folic acid 1 mg, and thiamine 100 mg daily.  NUTRITION DIAGNOSIS:   Malnutrition (Severe) related to social / environmental circumstances as evidenced by energy intake < or equal to 50% for > or equal to 1 month, 12 percent weight loss over 1 month.  GOAL:   Patient will meet greater than or equal to 90% of their needs  MONITOR:   PO intake, Supplement acceptance, Labs, Weight trends, I & O's  REASON FOR ASSESSMENT:   Malnutrition Screening Tool    ASSESSMENT:   65 y.o.femalewith medical history significant of ETOH dependence, ETOH withdrawal seizures, DTs, cerebral palsy, HTN, and depression presenting with ETOH withdrawal. Patient has been drinking for the last 3 1/2 weeks. Patient under alcohol withdrawal with history of seizures, found to have hyponatremia.    Spoke with patient at bedside. She reports she has a good appetite and intake now. She reports she did not have any food for 2 weeks PTA and very limited intake for the 2 weeks prior to that in setting of EtOH intake. She is experiencing some diarrhea, nausea, and abdominal pain now but reports it is not affecting her intake.   Patient repots UBW 160 lbs. Patient has lost 19 lbs (12% body weight) over the past month per report, which is significant for time frame.  Meal Completion: 100% of Regular diet  Medications reviewed and include: folic acid 1 mg daily, multivitamin with minerals daily, pantoprazole, thiamine 100 mg daily, NS @ 50 ml/hr.  Labs  reviewed: CBG 90-128, Sodium 129, Chloride 97, CO2 21, BUN 23. Potassium WNL today, but was decreased on 12/27.  Nutrition-Focused physical exam completed. Findings are no fat depletion, mild-moderate muscle depletion noted in lower body only, and no edema. Patient not able to ambulate well (requires 2 assist per NT) so some level of wasting in lower body expected in setting of Cp.  Patient meets criteria for severe malnutrition in setting of social or environmental circumstances in setting of intake </= 50% for >/= 1 month, 12% weight loss in 1 month per report.   Discussed with RN.   Diet Order:  Diet regular Room service appropriate? Yes; Fluid consistency: Thin  Skin:  Reviewed, no issues  Last BM:  12/282017  Height:   Ht Readings from Last 1 Encounters:  06/28/16 5\' 5"  (1.651 m)    Weight:   Wt Readings from Last 1 Encounters:  06/28/16 140 lb (63.5 kg)    Ideal Body Weight:  56.8 kg  BMI:  Body mass index is 23.3 kg/m.  Estimated Nutritional Needs:   Kcal:  1300-1500 (MSJ x 1.1-1.3)  Protein:  65-76 grams (1-1.2 grams/kg)  Fluid:  >/= 1.5 L/day (25 ml/kg) or per MD in setting of hyponatremia  EDUCATION NEEDS:   No education needs identified at this time  Helane RimaLeanne Dorise Gangi, MS, RD, LDN Pager: [phone] After Hours Pager: [phone]

## 2016-06-30 NOTE — Progress Notes (Signed)
PHARMACIST - PHYSICIAN COMMUNICATION  DR:   Sharl MaLama  CONCERNING: IV to Oral Route Change Policy  RECOMMENDATION: This patient is receiving thiamine by the intravenous route.  Based on criteria approved by the Pharmacy and Therapeutics Committee, the intravenous medication(s) is/are being converted to the equivalent oral dose form(s).   DESCRIPTION: These criteria include:  The patient is eating (either orally or via tube) and/or has been taking other orally administered medications for a least 24 hours  The patient has no evidence of active gastrointestinal bleeding or impaired GI absorption (gastrectomy, short bowel, patient on TNA or NPO).  If you have questions about this conversion, please contact the Pharmacy Department  []   [phone]( [phone] )  [facility] []   [phone]( [phone] )  [facility] []   [phone]( [phone] )  [facility] []   [phone]( [phone] )  [facility] [x]   [phone]( [phone] )  [facility]   Emily FilbertLilliston, Francella Barnett [place], [facility]RPH 06/30/2016 3:05 PM

## 2016-07-01 DIAGNOSIS — N179 Acute kidney failure, unspecified: Secondary | ICD-10-CM | POA: Diagnosis not present

## 2016-07-01 DIAGNOSIS — E871 Hypo-osmolality and hyponatremia: Secondary | ICD-10-CM | POA: Diagnosis not present

## 2016-07-01 DIAGNOSIS — F10239 Alcohol dependence with withdrawal, unspecified: Secondary | ICD-10-CM | POA: Diagnosis not present

## 2016-07-01 DIAGNOSIS — K921 Melena: Secondary | ICD-10-CM | POA: Diagnosis not present

## 2016-07-01 LAB — CBC
HCT: 25.7 % — ABNORMAL LOW (ref 36.0–46.0)
HEMOGLOBIN: 9 g/dL — AB (ref 12.0–15.0)
MCH: 31.8 pg (ref 26.0–34.0)
MCHC: 35 g/dL (ref 30.0–36.0)
MCV: 90.8 fL (ref 78.0–100.0)
Platelets: 279 10*3/uL (ref 150–400)
RBC: 2.83 MIL/uL — AB (ref 3.87–5.11)
RDW: 14.6 % (ref 11.5–15.5)
WBC: 5.5 10*3/uL (ref 4.0–10.5)

## 2016-07-01 LAB — PHOSPHORUS: PHOSPHORUS: 1.4 mg/dL — AB (ref 2.5–4.6)

## 2016-07-01 LAB — BASIC METABOLIC PANEL
ANION GAP: 6 (ref 5–15)
BUN: 13 mg/dL (ref 6–20)
CHLORIDE: 102 mmol/L (ref 101–111)
CO2: 26 mmol/L (ref 22–32)
Calcium: 7.8 mg/dL — ABNORMAL LOW (ref 8.9–10.3)
Creatinine, Ser: 0.5 mg/dL (ref 0.44–1.00)
Glucose, Bld: 96 mg/dL (ref 65–99)
POTASSIUM: 3.6 mmol/L (ref 3.5–5.1)
SODIUM: 134 mmol/L — AB (ref 135–145)

## 2016-07-01 LAB — MAGNESIUM: MAGNESIUM: 2.1 mg/dL (ref 1.7–2.4)

## 2016-07-01 MED ORDER — ALPRAZOLAM 0.5 MG PO TABS
0.5000 mg | ORAL_TABLET | Freq: Two times a day (BID) | ORAL | Status: DC | PRN
  Administered 2016-07-01 – 2016-07-02 (×3): 0.5 mg via ORAL
  Filled 2016-07-01 (×3): qty 1

## 2016-07-01 MED ORDER — K PHOS MONO-SOD PHOS DI & MONO 155-852-130 MG PO TABS
250.0000 mg | ORAL_TABLET | Freq: Two times a day (BID) | ORAL | Status: DC
  Administered 2016-07-01 – 2016-07-03 (×5): 250 mg via ORAL
  Filled 2016-07-01 (×6): qty 1

## 2016-07-01 NOTE — Progress Notes (Signed)
CSW completed SBIRT with patient and patients spouse in room. CSW asked patients spouse to step out; however, patient requested him to stay in room. Patient states she has been drinking heavily for the past 4 weeks. Patient has been sober for the past year but her sister recently passed away which she believes caused her to relapse. Patient stated "I would like to start going to AA meetings again because they helped me in the past". CSW provided outpatient and inpatient treatment programs for patient.   Stacy GardnerErin Farris Geiman, LCSWA Clinical Social Worker [phone]

## 2016-07-01 NOTE — Progress Notes (Signed)
Patient has blisters on her tongue. Chloraseptic spray given for pain.  Will continue to monitor patient.

## 2016-07-01 NOTE — Progress Notes (Signed)
[facility]  PROGRESS NOTE  Ashley BowensDarlene S Bradley ZOX:[id number] DOB: 06/06/1951 DOA: 06/28/2016 PCP: Neldon LabellaMILLER,[name], MD   Brief HPI:   65 y.o.femalewith medical history significant of ETOH dependence, ETOH withdrawal seizures, DTs, cerebral palsy, HTN, and depression presenting with ETOH withdrawal. Patient has been drinking for the last 3 1/2 weeks. 2 bottles of Chardonnay daily. Has h/o ETOH dependence since about age [age]y. Longest period of sobriety was 5 years. Had a seizure each of the last 2 days, none today. Has been having hallucinations, audiovisual, for 3 days. Her last drink was this morning, about 1230-1pm. Drank about 1/2-2/3 of a regular size wine bottle today. Very tremulous. She has been trying to drink less over the last few days, vomiting when she started drinking the last few days. Also with dark, tarry stools for the last 1-2 weeks.    Subjective   Patient seen and examined, no new complaints.   Assessment/Plan:     1. Hyponatremia- patient presented with sodium 120 which has now improved to 134. Likely from  dehydration. Urine osmolarity 436, serum osmolality is 280.  2. Alcohol withdrawal with history of seizures- no seizures in the hospital, continue CIWA protocol. 3. Acute kidney injury- patient's creatinine 1.45 previous creatinine 0.50 likely from dehydration. Continue IV fluids.  4. Melena- patient reported melena with heme-positive stool, hemoglobin dropped to 8.8.Hemoglobin is 9.0    DVT prophylaxis: SCd's  Code Status: Do not resuscitate  Family Communication: No family at bedside  Disposition Plan: Home in next 24 hrs   Consultants:  None  Procedures:  None   Continuous infusions . sodium chloride 50 mL/hr at 07/01/16 0204      Antibiotics:   Anti-infectives    None       Objective   Vitals:   06/30/16 0415 06/30/16 1412 06/30/16 2142 07/01/16 0536  BP: (!) 103/91 (!) 97/52 104/61 111/64  Pulse: 98 (!) 102 95  (!) 106  Resp: 16 18 20 20   Temp: 98.6 F (37 C) 98.5 F (36.9 C) 98.4 F (36.9 C) 98.2 F (36.8 C)  TempSrc: Oral Oral Oral Oral  SpO2: 100% 100% 98% 100%  Weight:      Height:        Intake/Output Summary (Last 24 hours) at 07/01/16 1502 Last data filed at 07/01/16 1043  Gross per 24 hour  Intake             1810 ml  Output             1025 ml  Net              785 ml   Filed Weights   06/28/16 1651  Weight: 63.5 kg (140 lb)     Physical Examination:  General exam: Appears calm and comfortable. Respiratory system: Clear to auscultation. Respiratory effort normal. Cardiovascular system:  RRR. No  murmurs, rubs, gallops. No pedal edema. GI system: Abdomen is nondistended, soft and nontender. No organomegaly.  Central nervous system. No focal neurological deficits. 5 x 5 power in all extremities. Skin: No rashes, lesions or ulcers. Psychiatry: Alert, oriented x 3.Judgement and insight appear normal. Affect normal.    Data Reviewed: I have personally reviewed following labs and imaging studies  CBG:  Recent Labs Lab 06/30/16 0755 06/30/16 1152  GLUCAP 90 128*    CBC:  Recent Labs Lab 06/28/16 1647 06/29/16 1040 07/01/16 0454  WBC 8.9 6.4 5.5  HGB 12.4 8.8* 9.0*  HCT 34.5* 24.5* 25.7*  MCV 86.9 88.1 90.8  PLT 311 235 279    Basic Metabolic Panel:  Recent Labs Lab 06/28/16 1647  06/29/16 0654 06/29/16 0934 06/29/16 1500 06/29/16 1711 06/30/16 0450 07/01/16 0454  NA 120*  < > 125* 125* 125* 126* 129* 134*  K 4.5  < > 3.6 3.8 3.5 3.3* 3.8 3.6  CL 75*  < > 91* 90* 92* 95* 97* 102  CO2 20*  < > 19* 19* 22 22 21* 26  GLUCOSE 80  < > 106* 102* 156* 123* 111* 96  BUN 45*  < > 44* 41* 35* 32* 23* 13  CREATININE 1.45*  < > 1.20* 1.15* 0.89 0.80 0.63 0.50  CALCIUM 8.6*  < > 6.9* 7.2* 7.3* 7.0* 7.8* 7.8*  MG 2.7*  --  2.2  --   --   --   --  2.1  PHOS  --   --  2.8  --   --   --   --  1.4*  < > = values in this interval not displayed.  No results  found for this or any previous visit (from the past 240 hour(s)).   Liver Function Tests:  Recent Labs Lab 06/28/16 1647  AST 87*  ALT 72*  ALKPHOS 148*  BILITOT 1.8*  PROT 7.3  ALBUMIN 4.1    Recent Labs Lab 06/28/16 1647  LIPASE 56*   No results for input(s): AMMONIA in the last 168 hours.  Cardiac Enzymes: No results for input(s): CKTOTAL, CKMB, CKMBINDEX, TROPONINI in the last 168 hours. BNP (last 3 results) No results for input(s): BNP in the last 8760 hours.  ProBNP (last 3 results) No results for input(s): PROBNP in the last 8760 hours.    Studies: No results found.  Scheduled Meds: . aspirin EC  81 mg Oral Daily  . feeding supplement (ENSURE ENLIVE)  237 mL Oral BID BM  . FLUoxetine  40 mg Oral Daily  . folic acid  1 mg Oral Daily  . multivitamin with minerals  1 tablet Oral Daily  . olopatadine  1 drop Both Eyes BID  . pantoprazole (PROTONIX) IV  40 mg Intravenous Q12H  . phosphorus  250 mg Oral BID  . sodium chloride flush  3 mL Intravenous Q12H  . thiamine  100 mg Oral Daily      Time spent: 25 min  [facility],Lavonda Thal S   [facility] Pager [phone]. If 7PM-7AM, please contact night-coverage at [email or web address], Office  [phone]  password [email or web address] 07/01/2016, 3:02 PM  LOS: 2 days

## 2016-07-01 NOTE — Care Management Note (Signed)
Case Management Note  Patient Details  Name: Ashley Bradley MRN: [id number] Date of Birth: 1950-09-23  Subjective/Objective: 65 y/o f admitted w/hyponatremia. From home w/spouse. Has [facility]-custodial service for aide-tel#[phone]-[facility] rep aware-they DO NOT have HHPT. PT recc HHPT. Patient has used [facility] in the past will use again[facility] & [facility]-awaiting HHPT orders & [id number].                   Action/Plan:d/c home w/HHC.   Expected Discharge Date:   (unknown)               Expected Discharge Plan:  Home w Home Health Services  In-House Referral:     Discharge planning Services  CM Consult  Post Acute Care Choice:  Resumption of Svcs/PTA Provider Production designer, theatre/television/film([facility]-private duty aide) Choice offered to:  Patient  DME Arranged:    DME Agency:     HH Arranged:    HH Agency:     Status of Service:  In process, will continue to follow  If discussed at Long Length of Stay Meetings, dates discussed:    Additional Comments:  Lanier ClamMahabir, Aryana Wonnacott, RN 07/01/2016, 4:01 PM

## 2016-07-01 NOTE — Evaluation (Addendum)
Physical Therapy Evaluation Patient Details Name: Ashley BowensDarlene S Suarez MRN: [id number] DOB: 06/17/1951 Today's Date: 07/01/2016   History of Present Illness  65 y.o. female with medical history significant of ETOH dependence, ETOH withdrawal seizures, DTs, cerebral palsy, HTN, and depression presenting with ETOH withdrawal  Clinical Impression  Pt admitted with above diagnosis. Pt currently with functional limitations due to the deficits listed below (see PT Problem List).  Pt will benefit from skilled PT to increase their independence and safety with mobility to allow discharge to the venue listed below.  Pt reports feeling groggy from taking xanax earlier.  Pt also very unsteady with ambulation today despite use of RW and also believes LOB (which required assist to correct) was due to medication.  Pt states she had [facility] home health PT last admission and would like this again, stating it was very helpful.     Follow Up Recommendations Home health PT;Supervision for mobility/OOB    Equipment Recommendations  None recommended by PT    Recommendations for Other Services       Precautions / Restrictions Precautions Precautions: Fall      Mobility  Bed Mobility Overal bed mobility: Needs Assistance Bed Mobility: Supine to Sit;Sit to Supine     Supine to sit: Supervision;HOB elevated Sit to supine: Supervision      Transfers Overall transfer level: Needs assistance Equipment used: Rolling walker (2 wheeled) Transfers: Sit to/from Stand Sit to Stand: Min assist         General transfer comment: slight assist to rise  Ambulation/Gait Ambulation/Gait assistance: Min assist Ambulation Distance (Feet): 80 Feet Assistive device: Rolling walker (2 wheeled) Gait Pattern/deviations: Step-through pattern     General Gait Details: pt without heel strike on R (forefoot contact), pt unsteady and required assist to correct LOB x1  Stairs            Wheelchair Mobility     Modified Rankin (Stroke Patients Only)       Balance Overall balance assessment: History of Falls                                           Pertinent Vitals/Pain Pain Assessment: No/denies pain    Home Living Family/patient expects to be discharged to:: Private residence Living Arrangements: Spouse/significant other   Type of Home: House Home Access: Stairs to enter   Secretary/administratorntrance Stairs-Number of Steps: 2 Home Layout: Able to live on main level with bedroom/bathroom Home Equipment: Environmental consultantWalker - 2 wheels      Prior Function Level of Independence: Independent               Hand Dominance        Extremity/Trunk Assessment        Lower Extremity Assessment Lower Extremity Assessment: RLE deficits/detail RLE Deficits / Details: residual deficits from CP per pt, limited DF, generally weaker then L LE       Communication   Communication: No difficulties  Cognition Arousal/Alertness: Awake/alert Behavior During Therapy: WFL for tasks assessed/performed Overall Cognitive Status: Within Functional Limits for tasks assessed                      General Comments      Exercises     Assessment/Plan    PT Assessment Patient needs continued PT services  PT Problem List Decreased strength;Decreased activity tolerance;Decreased balance;Decreased mobility  PT Treatment Interventions DME instruction;Gait training;Functional mobility training;Therapeutic activities;Stair training;Therapeutic exercise;Balance training;Patient/family education    PT Goals (Current goals can be found in the Care Plan section)  Acute Rehab PT Goals PT Goal Formulation: With patient Time For Goal Achievement: 07/08/16 Potential to Achieve Goals: Good    Frequency Min 3X/week   Barriers to discharge        Co-evaluation               End of Session Equipment Utilized During Treatment: Gait belt Activity Tolerance: Patient limited by  fatigue Patient left: in bed;with call bell/phone within reach;with bed alarm set Nurse Communication: Mobility status         Time: 1435-1453 PT Time Calculation (min) (ACUTE ONLY): 18 min   Charges:   PT Evaluation $PT Eval Moderate Complexity: 1 Procedure     PT G Codes:        Anyelin Mogle,KATHrine E 07/01/2016, 3:51 PM Zenovia JarredKati Yanisa Goodgame, PT, DPT 07/01/2016 Pager: [phone]

## 2016-07-02 DIAGNOSIS — R569 Unspecified convulsions: Secondary | ICD-10-CM | POA: Diagnosis not present

## 2016-07-02 DIAGNOSIS — F10239 Alcohol dependence with withdrawal, unspecified: Secondary | ICD-10-CM | POA: Diagnosis not present

## 2016-07-02 DIAGNOSIS — K921 Melena: Secondary | ICD-10-CM | POA: Diagnosis not present

## 2016-07-02 DIAGNOSIS — E871 Hypo-osmolality and hyponatremia: Secondary | ICD-10-CM | POA: Diagnosis not present

## 2016-07-02 LAB — URINALYSIS, ROUTINE W REFLEX MICROSCOPIC
BILIRUBIN URINE: NEGATIVE
Glucose, UA: NEGATIVE mg/dL
Ketones, ur: NEGATIVE mg/dL
NITRITE: POSITIVE — AB
PROTEIN: 30 mg/dL — AB
Specific Gravity, Urine: 1.009 (ref 1.005–1.030)
pH: 7 (ref 5.0–8.0)

## 2016-07-02 LAB — MAGNESIUM: Magnesium: 1.8 mg/dL (ref 1.7–2.4)

## 2016-07-02 LAB — PHOSPHORUS: Phosphorus: 1.9 mg/dL — ABNORMAL LOW (ref 2.5–4.6)

## 2016-07-02 MED ORDER — DOXYCYCLINE HYCLATE 100 MG PO TABS
100.0000 mg | ORAL_TABLET | Freq: Two times a day (BID) | ORAL | Status: DC
  Administered 2016-07-02 – 2016-07-05 (×7): 100 mg via ORAL
  Filled 2016-07-02 (×7): qty 1

## 2016-07-02 MED ORDER — BENZOCAINE 10 % MT GEL
Freq: Two times a day (BID) | OROMUCOSAL | Status: DC
  Administered 2016-07-02: 12:00:00 via OROMUCOSAL
  Administered 2016-07-03: 1 via OROMUCOSAL
  Administered 2016-07-03 – 2016-07-04 (×3): via OROMUCOSAL
  Administered 2016-07-04: 1 via OROMUCOSAL
  Administered 2016-07-05: 11:00:00 via OROMUCOSAL
  Filled 2016-07-02: qty 9.4

## 2016-07-02 MED ORDER — ALPRAZOLAM 0.5 MG PO TABS
0.5000 mg | ORAL_TABLET | Freq: Three times a day (TID) | ORAL | Status: DC | PRN
  Administered 2016-07-02 – 2016-07-05 (×5): 0.5 mg via ORAL
  Filled 2016-07-02 (×7): qty 1

## 2016-07-02 MED ORDER — GUAIFENESIN 100 MG/5ML PO SOLN
5.0000 mL | ORAL | Status: DC | PRN
  Administered 2016-07-02 – 2016-07-04 (×4): 100 mg via ORAL
  Filled 2016-07-02 (×4): qty 10

## 2016-07-02 NOTE — Progress Notes (Signed)
[facility] to assume care of patient - also notified her that a Urinalysis and Culture need to be collected.

## 2016-07-02 NOTE — Progress Notes (Signed)
Physical Therapy Treatment Patient Details Name: Ashley Bradley MRN: [id number] DOB: 10-Dec-1950 Today's Date: 07/02/2016    History of Present Illness 65 y.o. female with medical history significant of ETOH dependence, ETOH withdrawal seizures, DTs, cerebral palsy, HTN, and depression presenting with ETOH withdrawal    PT Comments    Pt groggy from meds, but agreeable to walk, do standing exercise and went to bathroom.  Encouraged pt to sit up in chair to be more awake, but she strongly preferred to get back to bed.  She continues to need HHPT to improve strength and endurance for independence at home   Follow Up Recommendations  Home health PT;Supervision for mobility/OOB     Equipment Recommendations  None recommended by PT    Recommendations for Other Services       Precautions / Restrictions Precautions Precautions: Fall Restrictions Weight Bearing Restrictions: No    Mobility  Bed Mobility Overal bed mobility: Needs Assistance Bed Mobility: Supine to Sit;Sit to Supine     Supine to sit: Supervision Sit to supine: Supervision      Transfers Overall transfer level: Needs assistance Equipment used: Rolling walker (2 wheeled) Transfers: Sit to/from Stand Sit to Stand: Supervision         General transfer comment: pt feels "groggy" and needs encouragement to walk more and participate   Ambulation/Gait Ambulation/Gait assistance: Min assist Ambulation Distance (Feet): 100 Feet Assistive device: Rolling walker (2 wheeled) Gait Pattern/deviations: Step-through pattern;Decreased dorsiflexion - right (previous ankle contracture ) Gait velocity: decreased   General Gait Details: pt has more difficulty with balance on turns.  She relies on walker for support    Stairs            Wheelchair Mobility    Modified Rankin (Stroke Patients Only)       Balance                                    Cognition Arousal/Alertness:  Lethargic;Suspect due to medications Behavior During Therapy: [facility]FL for tasks assessed/performed                   General Comments: pt awake, but feels groggy and feels it may be due to xanax she took this morning     Exercises Other Exercises Other Exercises: standing balance for endurance and upright posture.  Other Exercises: bathroom transfers and weight shift without diffuculty  Other Exercises: AROM of UE and LE x 5 reps     General Comments        Pertinent Vitals/Pain      Home Living                      Prior Function            PT Goals (current goals can now be found in the care plan section)      Frequency    Min 3X/week      PT Plan Current plan remains appropriate    Co-evaluation             End of Session Equipment Utilized During Treatment: Gait belt Activity Tolerance: Patient limited by fatigue Patient left: in bed;with call bell/phone within reach;with bed alarm set (encouraged to sit up in chair, strongly preferred to bed)     Time: [id number] PT Time Calculation (min) (ACUTE ONLY): 23 min  Charges:  $Gait Training: 8-22 mins $  Therapeutic Exercise: 8-22 mins                    G Codes:     Ashley Sundby K. Manson PasseyBrown, PT  Donnetta HailBrown, Ashley Bradley 07/02/2016, 12:03 PM

## 2016-07-02 NOTE — Progress Notes (Signed)
[facility]  PROGRESS NOTE  Ashley BowensDarlene S Bradley ZOX:[id number] DOB: 1951/07/02 DOA: 06/28/2016 PCP: Neldon LabellaMILLER,[name], MD   Brief HPI:   65 y.o.femalewith medical history significant of ETOH dependence, ETOH withdrawal seizures, DTs, cerebral palsy, HTN, and depression presenting with ETOH withdrawal. Patient has been drinking for the last 3 1/2 weeks. 2 bottles of Chardonnay daily. Has h/o ETOH dependence since about age [age]y. Longest period of sobriety was 5 years. Had a seizure each of the last 2 days, none today. Has been having hallucinations, audiovisual, for 3 days. Her last drink was this morning, about 1230-1pm. Drank about 1/2-2/3 of a regular size wine bottle today. Very tremulous. She has been trying to drink less over the last few days, vomiting when she started drinking the last few days. Also with dark, tarry stools for the last 1-2 weeks.    Subjective   Patient seen and examined, complains of coughing up yellow colored phlegm.   Assessment/Plan:     1. Hyponatremia- patient presented with sodium 120 which has now improved to 134. Likely from  dehydration. Urine osmolarity 436, serum osmolality is 280.  2. ? UTI- patient had abnormal UA at the time of admission, with positive nitrite. Will repeat UA and culture as patient is persistently tachycardic. 3. Acute bronchitis- We'll start patient empirically on doxycycline 100 mg by mouth twice a day 4. Alcohol withdrawal with history of seizures- no seizures in the hospital, continue CIWA protocol. 5. Acute kidney injury- patient's creatinine 1.45 previous creatinine 0.50 likely from dehydration. Continue IV fluids.  6. Melena- patient reported melena with heme-positive stool, hemoglobin dropped to 8.8.Hemoglobin is 9.0    DVT prophylaxis: SCd's  Code Status: Do not resuscitate  Family Communication: No family at bedside  Disposition Plan: Pending results of UA and urine culture, if no infection likely  discharge in next 24 hours   Consultants:  None  Procedures:  None   Continuous infusions . sodium chloride 10 mL/hr at 07/02/16 0849      Antibiotics:   Anti-infectives    Start     Dose/Rate Route Frequency Ordered Stop   07/02/16 0900  doxycycline (VIBRA-TABS) tablet 100 mg     100 mg Oral Every 12 hours 07/02/16 0851         Objective   Vitals:   07/01/16 1619 07/02/16 0029 07/02/16 0530 07/02/16 1424  BP: 110/71 116/75 105/64 114/71  Pulse: (!) 101 (!) 104 (!) 101 (!) 104  Resp: 20 20 (!) 22 20  Temp: 99.4 F (37.4 C) 99.1 F (37.3 C) 98.2 F (36.8 C) 99.8 F (37.7 C)  TempSrc: Oral Oral Oral Oral  SpO2: 96% 95% 100% 99%  Weight:      Height:        Intake/Output Summary (Last 24 hours) at 07/02/16 1620 Last data filed at 07/02/16 1425  Gross per 24 hour  Intake             1730 ml  Output              950 ml  Net              780 ml   Filed Weights   06/28/16 1651  Weight: 63.5 kg (140 lb)     Physical Examination:  General exam: Appears calm and comfortable. Respiratory system: Clear to auscultation. Respiratory effort normal. Cardiovascular system:  RRR. No  murmurs, rubs, gallops. No pedal edema. GI system: Abdomen is nondistended, soft and nontender. No  organomegaly.  Central nervous system. No focal neurological deficits. 5 x 5 power in all extremities. Skin: No rashes, lesions or ulcers. Psychiatry: Alert, oriented x 3.Judgement and insight appear normal. Affect normal.    Data Reviewed: I have personally reviewed following labs and imaging studies  CBG:  Recent Labs Lab 06/30/16 0755 06/30/16 1152  GLUCAP 90 128*    CBC:  Recent Labs Lab 06/28/16 1647 06/29/16 1040 07/01/16 0454  WBC 8.9 6.4 5.5  HGB 12.4 8.8* 9.0*  HCT 34.5* 24.5* 25.7*  MCV 86.9 88.1 90.8  PLT 311 235 279    Basic Metabolic Panel:  Recent Labs Lab 06/28/16 1647  06/29/16 0654 06/29/16 0934 06/29/16 1500 06/29/16 1711 06/30/16 0450  07/01/16 0454 07/02/16 0512  NA 120*  < > 125* 125* 125* 126* 129* 134*  --   K 4.5  < > 3.6 3.8 3.5 3.3* 3.8 3.6  --   CL 75*  < > 91* 90* 92* 95* 97* 102  --   CO2 20*  < > 19* 19* 22 22 21* 26  --   GLUCOSE 80  < > 106* 102* 156* 123* 111* 96  --   BUN 45*  < > 44* 41* 35* 32* 23* 13  --   CREATININE 1.45*  < > 1.20* 1.15* 0.89 0.80 0.63 0.50  --   CALCIUM 8.6*  < > 6.9* 7.2* 7.3* 7.0* 7.8* 7.8*  --   MG 2.7*  --  2.2  --   --   --   --  2.1 1.8  PHOS  --   --  2.8  --   --   --   --  1.4* 1.9*  < > = values in this interval not displayed.  No results found for this or any previous visit (from the past 240 hour(s)).   Liver Function Tests:  Recent Labs Lab 06/28/16 1647  AST 87*  ALT 72*  ALKPHOS 148*  BILITOT 1.8*  PROT 7.3  ALBUMIN 4.1    Recent Labs Lab 06/28/16 1647  LIPASE 56*   No results for input(s): AMMONIA in the last 168 hours.  Cardiac Enzymes: No results for input(s): CKTOTAL, CKMB, CKMBINDEX, TROPONINI in the last 168 hours. BNP (last 3 results) No results for input(s): BNP in the last 8760 hours.  ProBNP (last 3 results) No results for input(s): PROBNP in the last 8760 hours.    Studies: No results found.  Scheduled Meds: . aspirin EC  81 mg Oral Daily  . benzocaine   Mouth/Throat BID  . doxycycline  100 mg Oral Q12H  . feeding supplement (ENSURE ENLIVE)  237 mL Oral BID BM  . FLUoxetine  40 mg Oral Daily  . folic acid  1 mg Oral Daily  . multivitamin with minerals  1 tablet Oral Daily  . olopatadine  1 drop Both Eyes BID  . pantoprazole (PROTONIX) IV  40 mg Intravenous Q12H  . phosphorus  250 mg Oral BID  . sodium chloride flush  3 mL Intravenous Q12H  . thiamine  100 mg Oral Daily      Time spent: 25 min  [facility],Digby Groene[facility]d S   [facility] Pager [phone]. If 7PM-7AM, please contact night-coverage at [email or web address], Office  [phone]  password [email or web address] 07/02/2016, 4:20 PM  LOS: 3 days

## 2016-07-02 NOTE — Progress Notes (Addendum)
Dr. Sharl MaLama notified that patient has a cough (non-productive, strong, congested) which she states is a "new" onset "bronchitis for today," along with a temp of 99.8 and continues in sinus tachycardia at a current rate of 105, normotensive. Acetaminophen and Guaifenesin given per PRN order. See chart for new orders placed by Dr. Sharl MaLama.

## 2016-07-03 DIAGNOSIS — F10239 Alcohol dependence with withdrawal, unspecified: Secondary | ICD-10-CM | POA: Diagnosis not present

## 2016-07-03 DIAGNOSIS — E871 Hypo-osmolality and hyponatremia: Secondary | ICD-10-CM | POA: Diagnosis not present

## 2016-07-03 LAB — MAGNESIUM: MAGNESIUM: 1.6 mg/dL — AB (ref 1.7–2.4)

## 2016-07-03 LAB — PHOSPHORUS: PHOSPHORUS: 1.8 mg/dL — AB (ref 2.5–4.6)

## 2016-07-03 MED ORDER — K PHOS MONO-SOD PHOS DI & MONO 155-852-130 MG PO TABS
250.0000 mg | ORAL_TABLET | Freq: Two times a day (BID) | ORAL | Status: DC
  Administered 2016-07-03 – 2016-07-05 (×4): 250 mg via ORAL
  Filled 2016-07-03 (×4): qty 1

## 2016-07-03 MED ORDER — DEXTROSE 5 % IV SOLN
1.0000 g | INTRAVENOUS | Status: DC
  Administered 2016-07-03 – 2016-07-05 (×3): 1 g via INTRAVENOUS
  Filled 2016-07-03 (×3): qty 10

## 2016-07-03 MED ORDER — MAGNESIUM SULFATE 2 GM/50ML IV SOLN
2.0000 g | Freq: Once | INTRAVENOUS | Status: AC
  Administered 2016-07-03: 2 g via INTRAVENOUS
  Filled 2016-07-03: qty 50

## 2016-07-03 NOTE — Progress Notes (Signed)
[facility]  PROGRESS NOTE  Candise BowensDarlene S Sabey ZOX:[id number] DOB: August 02, 1950 DOA: 06/28/2016 PCP: Neldon LabellaMILLER,[name], MD   Brief HPI:   65 y.o.femalewith medical history significant of ETOH dependence, ETOH withdrawal seizures, DTs, cerebral palsy, HTN, and depression presenting with ETOH withdrawal. Patient has been drinking for the last 3 1/2 weeks. 2 bottles of Chardonnay daily. Has h/o ETOH dependence since about age [age]y. Longest period of sobriety was 5 years. Had a seizure each of the last 2 days, none today. Has been having hallucinations, audiovisual, for 3 days. Her last drink was this morning, about 1230-1pm. Drank about 1/2-2/3 of a regular size wine bottle today. Very tremulous. She has been trying to drink less over the last few days, vomiting when she started drinking the last few days. Also with dark, tarry stools for the last 1-2 weeks.    Subjective   Patient seen and examined, UA positive for UTI   Assessment/Plan:     1. Hyponatremia- patient presented with sodium 120 which has now improved to 134. Likely from  dehydration. Urine osmolarity 436, serum osmolality is 280.  2. UTI- patient had abnormal UA at the time of admission, with positive nitrite.Repeat UA shows positive nitrite and many WBCs. We'll start patient on Rocephin. Follow urine culture results. 3. Acute bronchitis-t patient empirically started on doxycycline 100 mg by mouth twice a day 4. Alcohol withdrawal with history of seizures- no seizures in the hospital, continue CIWA protocol. 5. Acute kidney injury- patient's creatinine 1.45 previous creatinine 0.50 likely from dehydration. Continue IV fluids.  6. Melena- patient reported melena with heme-positive stool, hemoglobin dropped to 8.8.Hemoglobin is 9.0 7. Hypophosphatemia- we'll start Neutra-Phos 250 mg twice a day 8. Hypomagnesemia-replace magnesium and check mag level in a.m.    DVT prophylaxis: SCd's  Code Status: Do not  resuscitate  Family Communication: No family at bedside  Disposition Plan: Pending results of UA and urine culture, Empirically started on IV Rocephin   Consultants:  None  Procedures:  None   Continuous infusions . sodium chloride 10 mL/hr at 07/02/16 0849      Antibiotics:   Anti-infectives    Start     Dose/Rate Route Frequency Ordered Stop   07/03/16 1000  cefTRIAXone (ROCEPHIN) 1 g in dextrose 5 % 50 mL IVPB     1 g 100 mL/hr over 30 Minutes Intravenous Every 24 hours 07/03/16 0830     07/02/16 0900  doxycycline (VIBRA-TABS) tablet 100 mg     100 mg Oral Every 12 hours 07/02/16 0851         Objective   Vitals:   07/02/16 0530 07/02/16 1424 07/02/16 2149 07/03/16 0500  BP: 105/64 114/71 119/87 130/82  Pulse: (!) 101 (!) 104 (!) 101 (!) 106  Resp: (!) 22 20 16 20   Temp: 98.2 F (36.8 C) 99.8 F (37.7 C) 99.1 F (37.3 C) 98.8 F (37.1 C)  TempSrc: Oral Oral Oral Oral  SpO2: 100% 99% 99% 96%  Weight:      Height:        Intake/Output Summary (Last 24 hours) at 07/03/16 1626 Last data filed at 07/03/16 0955  Gross per 24 hour  Intake              290 ml  Output             2050 ml  Net            -1760 ml   Filed Weights   06/28/16  1651  Weight: 63.5 kg (140 lb)     Physical Examination:  General exam: Appears calm and comfortable. Respiratory system: Clear to auscultation. Respiratory effort normal. Cardiovascular system:  RRR. No  murmurs, rubs, gallops. No pedal edema. GI system: Abdomen is nondistended, soft and nontender. No organomegaly.  Central nervous system. No focal neurological deficits. 5 x 5 power in all extremities. Skin: No rashes, lesions or ulcers. Psychiatry: Alert, oriented x 3.Judgement and insight appear normal. Affect normal.    Data Reviewed: I have personally reviewed following labs and imaging studies  CBG:  Recent Labs Lab 06/30/16 0755 06/30/16 1152  GLUCAP 90 128*    CBC:  Recent Labs Lab  06/28/16 1647 06/29/16 1040 07/01/16 0454  WBC 8.9 6.4 5.5  HGB 12.4 8.8* 9.0*  HCT 34.5* 24.5* 25.7*  MCV 86.9 88.1 90.8  PLT 311 235 279    Basic Metabolic Panel:  Recent Labs Lab 06/28/16 1647  06/29/16 0654 06/29/16 0934 06/29/16 1500 06/29/16 1711 06/30/16 0450 07/01/16 0454 07/02/16 0512 07/03/16 0525  NA 120*  < > 125* 125* 125* 126* 129* 134*  --   --   K 4.5  < > 3.6 3.8 3.5 3.3* 3.8 3.6  --   --   CL 75*  < > 91* 90* 92* 95* 97* 102  --   --   CO2 20*  < > 19* 19* 22 22 21* 26  --   --   GLUCOSE 80  < > 106* 102* 156* 123* 111* 96  --   --   BUN 45*  < > 44* 41* 35* 32* 23* 13  --   --   CREATININE 1.45*  < > 1.20* 1.15* 0.89 0.80 0.63 0.50  --   --   CALCIUM 8.6*  < > 6.9* 7.2* 7.3* 7.0* 7.8* 7.8*  --   --   MG 2.7*  --  2.2  --   --   --   --  2.1 1.8 1.6*  PHOS  --   --  2.8  --   --   --   --  1.4* 1.9* 1.8*  < > = values in this interval not displayed.  No results found for this or any previous visit (from the past 240 hour(s)).   Liver Function Tests:  Recent Labs Lab 06/28/16 1647  AST 87*  ALT 72*  ALKPHOS 148*  BILITOT 1.8*  PROT 7.3  ALBUMIN 4.1    Recent Labs Lab 06/28/16 1647  LIPASE 56*   No results for input(s): AMMONIA in the last 168 hours.  Cardiac Enzymes: No results for input(s): CKTOTAL, CKMB, CKMBINDEX, TROPONINI in the last 168 hours. BNP (last 3 results) No results for input(s): BNP in the last 8760 hours.  ProBNP (last 3 results) No results for input(s): PROBNP in the last 8760 hours.    Studies: No results found.  Scheduled Meds: . aspirin EC  81 mg Oral Daily  . benzocaine   Mouth/Throat BID  . cefTRIAXone (ROCEPHIN)  IV  1 g Intravenous Q24H  . doxycycline  100 mg Oral Q12H  . feeding supplement (ENSURE ENLIVE)  237 mL Oral BID BM  . FLUoxetine  40 mg Oral Daily  . folic acid  1 mg Oral Daily  . multivitamin with minerals  1 tablet Oral Daily  . olopatadine  1 drop Both Eyes BID  . pantoprazole  (PROTONIX) IV  40 mg Intravenous Q12H  . phosphorus  250 mg  Oral BID  . sodium chloride flush  3 mL Intravenous Q12H  . thiamine  100 mg Oral Daily      Time spent: 25 min  [facility] S   [facility] Pager [phone]. If 7PM-7AM, please contact night-coverage at [email or web address], Office  [phone]  password [email or web address] 07/03/2016, 4:26 PM  LOS: 4 days

## 2016-07-04 DIAGNOSIS — K921 Melena: Secondary | ICD-10-CM | POA: Diagnosis not present

## 2016-07-04 DIAGNOSIS — F10239 Alcohol dependence with withdrawal, unspecified: Secondary | ICD-10-CM | POA: Diagnosis not present

## 2016-07-04 DIAGNOSIS — E871 Hypo-osmolality and hyponatremia: Secondary | ICD-10-CM | POA: Diagnosis not present

## 2016-07-04 LAB — CBC
HCT: 25.1 % — ABNORMAL LOW (ref 36.0–46.0)
HEMOGLOBIN: 8.3 g/dL — AB (ref 12.0–15.0)
MCH: 31 pg (ref 26.0–34.0)
MCHC: 33.1 g/dL (ref 30.0–36.0)
MCV: 93.7 fL (ref 78.0–100.0)
Platelets: 374 10*3/uL (ref 150–400)
RBC: 2.68 MIL/uL — ABNORMAL LOW (ref 3.87–5.11)
RDW: 15.7 % — ABNORMAL HIGH (ref 11.5–15.5)
WBC: 4.7 10*3/uL (ref 4.0–10.5)

## 2016-07-04 LAB — COMPREHENSIVE METABOLIC PANEL
ALK PHOS: 101 U/L (ref 38–126)
ALT: 27 U/L (ref 14–54)
AST: 24 U/L (ref 15–41)
Albumin: 2.6 g/dL — ABNORMAL LOW (ref 3.5–5.0)
Anion gap: 7 (ref 5–15)
BUN: 11 mg/dL (ref 6–20)
CALCIUM: 7.9 mg/dL — AB (ref 8.9–10.3)
CO2: 30 mmol/L (ref 22–32)
CREATININE: 0.43 mg/dL — AB (ref 0.44–1.00)
Chloride: 103 mmol/L (ref 101–111)
GFR calc Af Amer: >60 mL/min (ref >60)
GFR calc non Af Amer: >60 mL/min (ref >60)
GLUCOSE: 114 mg/dL — AB (ref 65–99)
Potassium: 3.6 mmol/L (ref 3.5–5.1)
SODIUM: 140 mmol/L (ref 135–145)
Total Bilirubin: 0.5 mg/dL (ref 0.3–1.2)
Total Protein: 4.8 g/dL — ABNORMAL LOW (ref 6.5–8.1)

## 2016-07-04 LAB — MAGNESIUM: Magnesium: 1.8 mg/dL (ref 1.7–2.4)

## 2016-07-04 NOTE — Progress Notes (Signed)
[facility]  PROGRESS NOTE  Ashley Bradley WJX:[id number] DOB: 20-Apr-1951 DOA: 06/28/2016 PCP: [name], MD   Brief HPI:   66 y.o.femalewith medical history significant of ETOH dependence, ETOH withdrawal seizures, DTs, cerebral palsy, HTN, and depression presenting with ETOH withdrawal. Patient has been drinking for the last 3 1/2 weeks. 2 bottles of Chardonnay daily. Has h/o ETOH dependence since about age 16y. Longest period of sobriety was 5 years. Had a seizure each of the last 2 days, none today. Has been having hallucinations, audiovisual, for 3 days. Her last drink was this morning, about 1230-1pm. Drank about 1/2-2/3 of a regular size wine bottle today. Very tremulous. She has been trying to drink less over the last few days, vomiting when she started drinking the last few days. Also with dark, tarry stools for the last 1-2 weeks.    Subjective   Patient seen and examined, UA positive for UTI   Assessment/Plan:     1. Hyponatremia- patient presented with sodium 120 which has now improved to 134. Likely from  dehydration. Urine osmolarity 436, serum osmolality is 280.  2. UTI- patient had abnormal UA at the time of admission, with positive nitrite.Repeat UA shows positive nitrite and many WBCs. Urine culture showed gram negative rods, final report is pending.Continue Rocephin. 3. Acute bronchitis- patient empirically started on doxycycline 100 mg by mouth twice a day 4. Alcohol withdrawal with history of seizures- no seizures in the hospital, continue CIWA protocol. 5. Acute kidney injury- patient's creatinine 1.45 previous creatinine 0.50 likely from dehydration. Continue IV fluids.  6. Anemia- patient reported melena with heme-positive stool, hemoglobin dropped to 8.3.Will consult GI. 7. Hypophosphatemia- we'll start Neutra-Phos 250 mg twice a day 8. Hypomagnesemia-replaced magnesium, today level is 1.8.    DVT prophylaxis: SCd's  Code Status: Do  not resuscitate  Family Communication: No family at bedside  Disposition Plan: Pending results of UA and urine culture, Empirically started on IV Rocephin   Consultants:  None  Procedures:  None   Continuous infusions . sodium chloride 10 mL/hr at 07/02/16 0849      Antibiotics:   Anti-infectives    Start     Dose/Rate Route Frequency Ordered Stop   07/03/16 1000  cefTRIAXone (ROCEPHIN) 1 g in dextrose 5 % 50 mL IVPB     1 g 100 mL/hr over 30 Minutes Intravenous Every 24 hours 07/03/16 0830     07/02/16 0900  doxycycline (VIBRA-TABS) tablet 100 mg     100 mg Oral Every 12 hours 07/02/16 0851         Objective   Vitals:   07/03/16 0500 07/03/16 1755 07/03/16 2044 07/04/16 0529  BP: 130/82 119/66 124/75 118/72  Pulse: (!) 106 (!) 108 (!) 109 100  Resp: 20 20 18 18   Temp: 98.8 F (37.1 C) 97.8 F (36.6 C) 99.2 F (37.3 C) 98.3 F (36.8 C)  TempSrc: Oral Oral Oral Oral  SpO2: 96% 100% 100% 99%  Weight:      Height:        Intake/Output Summary (Last 24 hours) at 07/04/16 1206 Last data filed at 07/03/16 2345  Gross per 24 hour  Intake               50 ml  Output              100 ml  Net              -50 ml   [employer]  06/28/16 1651  Weight: 63.5 kg (140 lb)     Physical Examination:  General exam: Appears calm and comfortable. Respiratory system: Clear to auscultation. Respiratory effort normal. Cardiovascular system:  RRR. No  murmurs, rubs, gallops. No pedal edema. GI system: Abdomen is nondistended, soft and nontender. No organomegaly.  Central nervous system. No focal neurological deficits. 5 x 5 power in all extremities. Skin: No rashes, lesions or ulcers. Psychiatry: Alert, oriented x 3.Judgement and insight appear normal. Affect normal.    Data Reviewed: I have personally reviewed following labs and imaging studies  CBG:  Recent Labs Lab 06/30/16 0755 06/30/16 1152  GLUCAP 90 128*    CBC:  Recent Labs Lab  06/28/16 1647 06/29/16 1040 07/01/16 0454 07/04/16 0533  WBC 8.9 6.4 5.5 4.7  HGB 12.4 8.8* 9.0* 8.3*  HCT 34.5* 24.5* 25.7* 25.1*  MCV 86.9 88.1 90.8 93.7  PLT 311 235 279 374    Basic Metabolic Panel:  Recent Labs Lab 06/29/16 0654  06/29/16 1500 06/29/16 1711 06/30/16 0450 07/01/16 0454 07/02/16 0512 07/03/16 0525 07/04/16 0533  NA 125*  < > 125* 126* 129* 134*  --   --  140  K 3.6  < > 3.5 3.3* 3.8 3.6  --   --  3.6  CL 91*  < > 92* 95* 97* 102  --   --  103  CO2 19*  < > 22 22 21* 26  --   --  30  GLUCOSE 106*  < > 156* 123* 111* 96  --   --  114*  BUN 44*  < > 35* 32* 23* 13  --   --  11  CREATININE 1.20*  < > 0.89 0.80 0.63 0.50  --   --  0.43*  CALCIUM 6.9*  < > 7.3* 7.0* 7.8* 7.8*  --   --  7.9*  MG 2.2  --   --   --   --  2.1 1.8 1.6* 1.8  PHOS 2.8  --   --   --   --  1.4* 1.9* 1.8*  --   < > = values in this interval not displayed.  Recent Results (from the past 240 hour(s))  Urine culture     Status: Abnormal (Preliminary result)   Collection Time: 07/02/16  7:23 PM  Result Value Ref Range Status   Specimen Description URINE, CLEAN CATCH  Final   Special Requests NONE  Final   Culture >=100,000 COLONIES/mL GRAM NEGATIVE RODS (A)  Final   Report Status PENDING  Incomplete     Liver Function Tests:  Recent Labs Lab 06/28/16 1647 07/04/16 0533  AST 87* 24  ALT 72* 27  ALKPHOS 148* 101  BILITOT 1.8* 0.5  PROT 7.3 4.8*  ALBUMIN 4.1 2.6*    Recent Labs Lab 06/28/16 1647  LIPASE 56*   No results for input(s): AMMONIA in the last 168 hours.  Cardiac Enzymes: No results for input(s): CKTOTAL, CKMB, CKMBINDEX, TROPONINI in the last 168 hours. BNP (last 3 results) No results for input(s): BNP in the last 8760 hours.  ProBNP (last 3 results) No results for input(s): PROBNP in the last 8760 hours.    Studies: No results found.  Scheduled Meds: . aspirin EC  81 mg Oral Daily  . benzocaine   Mouth/Throat BID  . cefTRIAXone (ROCEPHIN)  IV   1 g Intravenous Q24H  . doxycycline  100 mg Oral Q12H  . feeding supplement (ENSURE ENLIVE)  237 mL Oral  BID BM  . FLUoxetine  40 mg Oral Daily  . folic acid  1 mg Oral Daily  . multivitamin with minerals  1 tablet Oral Daily  . olopatadine  1 drop Both Eyes BID  . pantoprazole (PROTONIX) IV  40 mg Intravenous Q12H  . phosphorus  250 mg Oral BID  . sodium chloride flush  3 mL Intravenous Q12H  . thiamine  100 mg Oral Daily      Time spent: 25 min  [facility]   [facility] Pager [phone]. If 7PM-7AM, please contact night-coverage at [email or web address], Office  [phone]  password [email or web address] 07/04/2016, 12:06 PM  LOS: 5 days

## 2016-07-04 NOTE — Progress Notes (Signed)
I was called by Dr Sharl MaLama for GI consult.  This patient tells me she had a routine colonoscopy with Dr Ronney AstersBucchini of [facility] about 5 years ago.  I have spoken to Dr Sharl MaLama and asked him to contact the [facility], Dr Madilyn FiremanHayes.  Amada JupiterHenry Danis, MD [facility]

## 2016-07-05 DIAGNOSIS — K921 Melena: Secondary | ICD-10-CM | POA: Diagnosis not present

## 2016-07-05 DIAGNOSIS — Y900 Blood alcohol level of less than 20 mg/100 ml: Secondary | ICD-10-CM | POA: Diagnosis present

## 2016-07-05 DIAGNOSIS — Z23 Encounter for immunization: Secondary | ICD-10-CM | POA: Diagnosis not present

## 2016-07-05 DIAGNOSIS — F10239 Alcohol dependence with withdrawal, unspecified: Secondary | ICD-10-CM | POA: Diagnosis not present

## 2016-07-05 DIAGNOSIS — N390 Urinary tract infection, site not specified: Secondary | ICD-10-CM

## 2016-07-05 DIAGNOSIS — E871 Hypo-osmolality and hyponatremia: Secondary | ICD-10-CM | POA: Diagnosis not present

## 2016-07-05 DIAGNOSIS — R569 Unspecified convulsions: Secondary | ICD-10-CM | POA: Diagnosis present

## 2016-07-05 LAB — URINE CULTURE: Culture: >=100000 — AB

## 2016-07-05 MED ORDER — FOLIC ACID 1 MG PO TABS: 1.0000 mg | ORAL_TABLET | Freq: Every day | ORAL | 2 refills | Status: DC

## 2016-07-05 MED ORDER — THIAMINE HCL 100 MG PO TABS: 100.0000 mg | ORAL_TABLET | Freq: Every day | ORAL | 2 refills | Status: DC

## 2016-07-05 MED ORDER — ALPRAZOLAM 0.5 MG PO TABS: 0.5000 mg | ORAL_TABLET | Freq: Three times a day (TID) | ORAL | 0 refills | Status: DC | PRN

## 2016-07-05 MED ORDER — K PHOS MONO-SOD PHOS DI & MONO 155-852-130 MG PO TABS: 250.0000 mg | ORAL_TABLET | Freq: Two times a day (BID) | ORAL | 0 refills | Status: DC

## 2016-07-05 MED ORDER — CEPHALEXIN 500 MG PO CAPS: 500.0000 mg | ORAL_CAPSULE | Freq: Two times a day (BID) | ORAL | 0 refills | Status: DC

## 2016-07-05 NOTE — Care Management Note (Signed)
Case Management Note  Patient Details  Name: Ashley Bradley MRN: [id number] Date of Birth: [birth year]  Subjective/Objective: d/c home today w/HHPT ordred-[facility] rep Edwinna aware of HHC orders & d/c. No further CM needs.                   Action/Plan:d/c home w/HHPT.   Expected Discharge Date:   (unknown)               Expected Discharge Plan:  Home w Home Health Services  In-House Referral:     Discharge planning Services  CM Consult  Post Acute Care Choice:  Resumption of Svcs/PTA Provider Production designer, theatre/television/film([employer]-private duty aide) Choice offered to:  Patient  DME Arranged:    [facility]  [facility]     Status of Service:  Completed, signed off  If discussed at Long Length of Stay Meetings, dates discussed:    Additional Comments:  Lanier ClamMahabir, Vidalia Serpas, RN 07/05/2016, 11:00 AM

## 2016-07-05 NOTE — Progress Notes (Signed)
PT Cancellation Note  Patient Details Name: Ashley Bradley MRN: [id number] DOB: 03-Jul-1951   Cancelled Treatment:    Reason Eval/Treat Not Completed: Other (comment) Pt reports d/c home today and will be receiving [facility] PT and pleasantly declines to mobilize prior to d/c.   Rakeb Kibble,KATHrine E 07/05/2016, 11:01 AM Zenovia JarredKati Jazion Atteberry, PT, DPT 07/05/2016 Pager: [phone]

## 2016-07-05 NOTE — Progress Notes (Signed)
Nutrition Follow-up  DOCUMENTATION CODES:   Severe malnutrition in context of social or environmental circumstances  INTERVENTION:  - Continue Ensure Enlive BID and to encourage PO intakes of meals if pt unable to d/c today. - Monitor magnesium, potassium, and phosphorus daily, MD to replete as needed, as pt is at risk for refeeding syndrome given very poor PO intakes PTA, current hypophosphatemia.   NUTRITION DIAGNOSIS:   Malnutrition (Severe) related to social / environmental circumstances as evidenced by energy intake < or equal to 50% for > or equal to 1 month, percent weight loss. -ongoing, intakes improved since admission.  GOAL:   Patient will meet greater than or equal to 90% of their needs -met with PO intakes of meals and supplements.   MONITOR:   PO intake, Supplement acceptance, Labs, Weight trends, I & O's  ASSESSMENT:   66 y.o.femalewith medical history significant of ETOH dependence, ETOH withdrawal seizures, DTs, cerebral palsy, HTN, and depression presenting with ETOH withdrawal. Patient has been drinking for the last 3 1/2 weeks. Patient under alcohol withdrawal with history of seizures, found to have hyponatremia.   1/2 Per chart review, pt with mainly 100% meal completion since previous RD assessment. No new weight recorded since admission. Order and summary in for d/c to home today. Will monitor for needs if pt unable to d/c. Pt noted to be at risk of refeeding d/t poor intakes PTA; will continue to monitor this with hypophosphatemia still present.   Medications reviewed; 1 mg oral folic acid/day, daily multivitamin with minerals, PRN Zofran, 40 mg IV Protonix BID, 250 mg oral KPhos Neutral BID, 100 mg oral thiamine/day.  Labs reviewed; creatinine: 0.43 mg/dL, Ca: 7.9 mg/dL, K and Mg WDL, Phos: 1.8 mg/dL.     12/28 - She reports she has a good appetite and intake now.  - She did not have any food for 2 weeks PTA and very limited intake for the 2 weeks  prior to that in setting of EtOH intake.  - Experiencing some diarrhea, nausea, and abdominal pain now but reports it is not affecting her intake.  - Patient repots UBW 160 lbs.  - Patient has lost 19 lbs (12% body weight) over the past month per report, which is significant for time frame. - Meal Completion: 100% of Regular diet  Nutrition-Focused physical exam completed. Findings are no fat depletion, mild-moderate muscle depletion noted in lower body only, and no edema. Patient not able to ambulate well (requires 2 assist per NT) so some level of wasting in lower body expected in setting of Cp.  Patient meets criteria for severe malnutrition in setting of social or environmental circumstances in setting of intake </= 50% for >/= 1 month, 12% weight loss in 1 month per report.     Diet Order:  Diet regular Room service appropriate? Yes; Fluid consistency: Thin Diet - low sodium heart healthy  Skin:  Reviewed, no issues  Last BM:  1/1  Height:   Ht Readings from Last 1 Encounters:  06/28/16 5' 5"  (1.651 m)    Weight:   Wt Readings from Last 1 Encounters:  06/28/16 140 lb (63.5 kg)    Ideal Body Weight:  56.8 kg  BMI:  Body mass index is 23.3 kg/m.  Estimated Nutritional Needs:   Kcal:  1300-1500 (MSJ x 1.1-1.3)  Protein:  65-76 grams (1-1.2 grams/kg)  Fluid:  >/= 1.5 L/day (25 ml/kg) or per MD in setting of hyponatremia  EDUCATION NEEDS:   No education needs  identified at this time    Jarome Matin, MS, RD, LDN, [facility] Inpatient Clinical Dietitian Pager # [phone] After hours/weekend pager # [phone]

## 2016-07-05 NOTE — Discharge Summary (Addendum)
Physician Discharge Summary  Ashley Bradley ZOX:[id number] DOB: Mar 11, 1951 DOA: 06/28/2016  PCP: [name], MD  Admit date: 06/28/2016 Discharge date: 07/05/2016  Time spent: 25* minutes  Recommendations for Outpatient Follow-up:  Follow up PCP in 2 weeks Follow up Dr Matthias Hughs in 1-2 weeks Check Phosphorous  Level in 2 weeks Patient to go home on HHPT  Discharge Diagnoses:  Principal Problem:   Hyponatremia Active Problems:   Seizures (HCC)   Alcohol withdrawal (HCC)   Acute renal failure (ARF) (HCC)   Melena   Discharge Condition: Stable  Diet recommendation: Low salt diet  Filed Weights   06/28/16 1651  Weight: 63.5 kg (140 lb)    History of present illness:  66 y.o.femalewith medical history significant of ETOH dependence, ETOH withdrawal seizures, DTs, cerebral palsy, HTN, and depression presenting with ETOH withdrawal. Patient has been drinking for the last 3 1/2 weeks. 2 bottles of Chardonnay daily. Has h/o ETOH dependence since about age 57y. Longest period of sobriety was 5 years. Had a seizure each of the last 2 days, none today. Has been having hallucinations, audiovisual, for 3 days. Her last drink was this morning, about 1230-1pm. Drank about 1/2-2/3 of a regular size wine bottle today. Very tremulous. She has been trying to drink less over the last few days, vomiting when she started drinking the last few days. Also with dark, tarry stools for the last 1-2 weeks.  Hospital Course:  1. Hyponatremia-patient presented with sodium 120 which has now improved to 134. Likely from dehydration. Urine osmolarity 436, serum osmolality is 280.  2. UTI- patient had abnormal UA at the time of admission, with positive nitrite.Repeat UA shows positive nitrite and many WBCs. Urine culture showed gram negative rods, final report ishowed E Coli sensitive to Cefazolin, will discharge on Keflex 500 mg po BID x 5 more days. Patient was treated with Ceftriaxone in the  hospital. 3. Acute bronchitis- patient empirically started on doxycycline 100 mg by mouth twice a day 4. Alcohol withdrawal with history of seizures- resolved, still has some anxiety, no seizures in the hospital, was on  CIWA protocol. Will discharge on Xanax 0.5 mg Po TID prn for 5 more days, 5. Acute kidney injury-patient's creatinine 1.45 previous creatinine 0.50 likely from dehydration.  6. Anemia- patient reported melena with heme-positive stool, hemoglobin dropped to 8.3.Called GI, Dr Madilyn Fireman, she has seen Dr Matthias Hughs in past, and he recommends to follow up with Dr Matthias Hughs as outpatient. Hemoglobin remains stable. 7. Hypophosphatemia- we'll start Neutra-Phos 250 mg twice a day, for 10 more days. Follow up , check Phos levels in 2 weeks. 8. Hypomagnesemia-replaced magnesium, level is 1.8.  Procedures:  None   Consultations:  Phone consult Dr Madilyn Fireman  Discharge Exam: Vitals:   07/04/16 2050 07/05/16 0552  BP: 113/71 137/83  Pulse: 94 88  Resp: 18 16  Temp: 98.4 F (36.9 C) 98 F (36.7 C)    General: Appears in no acute distress Cardiovascular: RRR, S1S2 Respiratory: Clear bilaterally  Discharge Instructions   Discharge Instructions    Diet - low sodium heart healthy    Complete by:  As directed    Discharge instructions    Complete by:  As directed    Follow up Gastroenterology Dr Matthias Hughs as outpatient for Anemia/blood in stool.   Increase activity slowly    Complete by:  As directed      Current Discharge Medication List    START taking these medications   Details  ALPRAZolam Prudy Feeler)  0.5 MG tablet Take 1 tablet (0.5 mg total) by mouth 3 (three) times daily as needed for anxiety. Qty: 15 tablet, Refills: 0    cephALEXin (KEFLEX) 500 MG capsule Take 1 capsule (500 mg total) by mouth 2 (two) times daily. Qty: 10 capsule, Refills: 0    folic acid (FOLVITE) 1 MG tablet Take 1 tablet (1 mg total) by mouth daily. Qty: 30 tablet, Refills: 2    thiamine 100 MG tablet  Take 1 tablet (100 mg total) by mouth daily. Qty: 30 tablet, Refills: 2      CONTINUE these medications which have NOT CHANGED   Details  aspirin EC 81 MG tablet Take 81 mg by mouth daily.    FLUoxetine (PROZAC) 20 MG capsule Take 1 capsule (20 mg total) by mouth daily. Qty: 30 capsule, Refills: 3    losartan (COZAAR) 50 MG tablet Take 1 tablet (50 mg total) by mouth daily. Qty: 30 tablet, Refills: 0    Multiple Vitamin (MULTIVITAMIN WITH MINERALS) TABS tablet Take 1 tablet by mouth daily. Qty: 30 tablet, Refills: 1    saccharomyces boulardii (FLORASTOR) 250 MG capsule Take 250 mg by mouth 2 (two) times daily.       Allergies  Allergen Reactions  . Macrodantin [Nitrofurantoin] Nausea And Vomiting      The results of significant diagnostics from this hospitalization (including imaging, microbiology, ancillary and laboratory) are listed below for reference.    Significant Diagnostic Studies: No results found.  Microbiology: Recent Results (from the past 240 hour(s))  Urine culture     Status: Abnormal   Collection Time: 07/02/16  7:23 PM  Result Value Ref Range Status   Specimen Description URINE, CLEAN CATCH  Final   Special Requests NONE  Final   Culture >=100,000 COLONIES/mL ESCHERICHIA COLI (A)  Final   Report Status 07/05/2016 FINAL  Final   Organism ID, Bacteria ESCHERICHIA COLI (A)  Final      Susceptibility   Escherichia coli - MIC*    AMPICILLIN 4 SENSITIVE Sensitive     CEFAZOLIN <=4 SENSITIVE Sensitive     CEFTRIAXONE <=1 SENSITIVE Sensitive     CIPROFLOXACIN <=0.25 SENSITIVE Sensitive     GENTAMICIN <=1 SENSITIVE Sensitive     IMIPENEM <=0.25 SENSITIVE Sensitive     NITROFURANTOIN <=16 SENSITIVE Sensitive     TRIMETH/SULFA <=20 SENSITIVE Sensitive     AMPICILLIN/SULBACTAM <=2 SENSITIVE Sensitive     PIP/TAZO <=4 SENSITIVE Sensitive     Extended ESBL NEGATIVE Sensitive     * >=100,000 COLONIES/mL ESCHERICHIA COLI     Labs: Basic Metabolic  Panel:  Recent Labs Lab 06/29/16 0654  06/29/16 1500 06/29/16 1711 06/30/16 0450 07/01/16 0454 07/02/16 0512 07/03/16 0525 07/04/16 0533  NA 125*  < > 125* 126* 129* 134*  --   --  140  K 3.6  < > 3.5 3.3* 3.8 3.6  --   --  3.6  CL 91*  < > 92* 95* 97* 102  --   --  103  CO2 19*  < > 22 22 21* 26  --   --  30  GLUCOSE 106*  < > 156* 123* 111* 96  --   --  114*  BUN 44*  < > 35* 32* 23* 13  --   --  11  CREATININE 1.20*  < > 0.89 0.80 0.63 0.50  --   --  0.43*  CALCIUM 6.9*  < > 7.3* 7.0* 7.8* 7.8*  --   --  7.9*  MG 2.2  --   --   --   --  2.1 1.8 1.6* 1.8  PHOS 2.8  --   --   --   --  1.4* 1.9* 1.8*  --   < > = values in this interval not displayed. Liver Function Tests:  Recent Labs Lab 06/28/16 1647 07/04/16 0533  AST 87* 24  ALT 72* 27  ALKPHOS 148* 101  BILITOT 1.8* 0.5  PROT 7.3 4.8*  ALBUMIN 4.1 2.6*    Recent Labs Lab 06/28/16 1647  LIPASE 56*   No results for input(s): AMMONIA in the last 168 hours. CBC:  Recent Labs Lab 06/28/16 1647 06/29/16 1040 07/01/16 0454 07/04/16 0533  WBC 8.9 6.4 5.5 4.7  HGB 12.4 8.8* 9.0* 8.3*  HCT 34.5* 24.5* 25.7* 25.1*  MCV 86.9 88.1 90.8 93.7  PLT 311 235 279 374    CBG:  Recent Labs Lab 06/30/16 0755 06/30/16 1152  GLUCAP 90 128*       Signed:  Rhilynn Preyer S MD.  [facility] 07/05/2016, 10:45 AM

## 2017-10-13 ENCOUNTER — Inpatient Hospital Stay (HOSPITAL_COMMUNITY)
Admission: EM | Admit: 2017-10-13 | Discharge: 2017-10-26 | DRG: 682 | Disposition: A | Discharge status: Home Health | Payer: Medicare Other | Attending: Internal Medicine | Admitting: Internal Medicine

## 2017-10-13 ENCOUNTER — Inpatient Hospital Stay (HOSPITAL_COMMUNITY): Payer: Medicare Other

## 2017-10-13 ENCOUNTER — Emergency Department (HOSPITAL_COMMUNITY): Payer: Medicare Other

## 2017-10-13 ENCOUNTER — Other Ambulatory Visit: Payer: Self-pay

## 2017-10-13 DIAGNOSIS — T287XXA Corrosion of other parts of alimentary tract, initial encounter: Secondary | ICD-10-CM | POA: Diagnosis present

## 2017-10-13 DIAGNOSIS — I10 Essential (primary) hypertension: Secondary | ICD-10-CM | POA: Diagnosis present

## 2017-10-13 DIAGNOSIS — R05 Cough: Secondary | ICD-10-CM

## 2017-10-13 DIAGNOSIS — Y92230 Patient room in hospital as the place of occurrence of the external cause: Secondary | ICD-10-CM | POA: Diagnosis not present

## 2017-10-13 DIAGNOSIS — R059 Cough, unspecified: Secondary | ICD-10-CM

## 2017-10-13 DIAGNOSIS — J690 Pneumonitis due to inhalation of food and vomit: Secondary | ICD-10-CM | POA: Diagnosis not present

## 2017-10-13 DIAGNOSIS — F329 Major depressive disorder, single episode, unspecified: Secondary | ICD-10-CM | POA: Diagnosis present

## 2017-10-13 DIAGNOSIS — I5030 Unspecified diastolic (congestive) heart failure: Secondary | ICD-10-CM

## 2017-10-13 DIAGNOSIS — N19 Unspecified kidney failure: Secondary | ICD-10-CM

## 2017-10-13 DIAGNOSIS — Z4659 Encounter for fitting and adjustment of other gastrointestinal appliance and device: Secondary | ICD-10-CM

## 2017-10-13 DIAGNOSIS — E872 Acidosis, unspecified: Secondary | ICD-10-CM

## 2017-10-13 DIAGNOSIS — D6959 Other secondary thrombocytopenia: Secondary | ICD-10-CM | POA: Diagnosis present

## 2017-10-13 DIAGNOSIS — R7881 Bacteremia: Secondary | ICD-10-CM

## 2017-10-13 DIAGNOSIS — R569 Unspecified convulsions: Secondary | ICD-10-CM | POA: Diagnosis present

## 2017-10-13 DIAGNOSIS — G9341 Metabolic encephalopathy: Secondary | ICD-10-CM | POA: Diagnosis present

## 2017-10-13 DIAGNOSIS — K449 Diaphragmatic hernia without obstruction or gangrene: Secondary | ICD-10-CM | POA: Diagnosis present

## 2017-10-13 DIAGNOSIS — E871 Hypo-osmolality and hyponatremia: Secondary | ICD-10-CM | POA: Diagnosis present

## 2017-10-13 DIAGNOSIS — I4581 Long QT syndrome: Secondary | ICD-10-CM | POA: Diagnosis present

## 2017-10-13 DIAGNOSIS — R21 Rash and other nonspecific skin eruption: Secondary | ICD-10-CM | POA: Insufficient documentation

## 2017-10-13 DIAGNOSIS — E876 Hypokalemia: Secondary | ICD-10-CM | POA: Diagnosis not present

## 2017-10-13 DIAGNOSIS — T361X5A Adverse effect of cephalosporins and other beta-lactam antibiotics, initial encounter: Secondary | ICD-10-CM | POA: Diagnosis not present

## 2017-10-13 DIAGNOSIS — Z01818 Encounter for other preprocedural examination: Secondary | ICD-10-CM

## 2017-10-13 DIAGNOSIS — T5494XA Toxic effect of unspecified corrosive substance, undetermined, initial encounter: Secondary | ICD-10-CM | POA: Diagnosis present

## 2017-10-13 DIAGNOSIS — F10229 Alcohol dependence with intoxication, unspecified: Secondary | ICD-10-CM | POA: Diagnosis present

## 2017-10-13 DIAGNOSIS — R197 Diarrhea, unspecified: Secondary | ICD-10-CM | POA: Diagnosis present

## 2017-10-13 DIAGNOSIS — K760 Fatty (change of) liver, not elsewhere classified: Secondary | ICD-10-CM | POA: Diagnosis present

## 2017-10-13 DIAGNOSIS — N179 Acute kidney failure, unspecified: Secondary | ICD-10-CM | POA: Diagnosis present

## 2017-10-13 DIAGNOSIS — J384 Edema of larynx: Secondary | ICD-10-CM | POA: Diagnosis not present

## 2017-10-13 DIAGNOSIS — G9349 Other encephalopathy: Secondary | ICD-10-CM | POA: Diagnosis present

## 2017-10-13 DIAGNOSIS — J9601 Acute respiratory failure with hypoxia: Secondary | ICD-10-CM | POA: Diagnosis present

## 2017-10-13 DIAGNOSIS — B9561 Methicillin susceptible Staphylococcus aureus infection as the cause of diseases classified elsewhere: Secondary | ICD-10-CM | POA: Diagnosis not present

## 2017-10-13 DIAGNOSIS — D638 Anemia in other chronic diseases classified elsewhere: Secondary | ICD-10-CM | POA: Diagnosis present

## 2017-10-13 DIAGNOSIS — K920 Hematemesis: Secondary | ICD-10-CM | POA: Diagnosis present

## 2017-10-13 DIAGNOSIS — F10239 Alcohol dependence with withdrawal, unspecified: Secondary | ICD-10-CM | POA: Diagnosis present

## 2017-10-13 DIAGNOSIS — G809 Cerebral palsy, unspecified: Secondary | ICD-10-CM | POA: Diagnosis present

## 2017-10-13 DIAGNOSIS — R1310 Dysphagia, unspecified: Secondary | ICD-10-CM | POA: Diagnosis not present

## 2017-10-13 DIAGNOSIS — R627 Adult failure to thrive: Secondary | ICD-10-CM | POA: Diagnosis present

## 2017-10-13 DIAGNOSIS — R571 Hypovolemic shock: Secondary | ICD-10-CM | POA: Diagnosis present

## 2017-10-13 DIAGNOSIS — E8729 Other acidosis: Secondary | ICD-10-CM | POA: Diagnosis present

## 2017-10-13 DIAGNOSIS — Z8249 Family history of ischemic heart disease and other diseases of the circulatory system: Secondary | ICD-10-CM

## 2017-10-13 DIAGNOSIS — E162 Hypoglycemia, unspecified: Secondary | ICD-10-CM | POA: Diagnosis present

## 2017-10-13 DIAGNOSIS — L270 Generalized skin eruption due to drugs and medicaments taken internally: Secondary | ICD-10-CM | POA: Diagnosis not present

## 2017-10-13 DIAGNOSIS — Z801 Family history of malignant neoplasm of trachea, bronchus and lung: Secondary | ICD-10-CM

## 2017-10-13 LAB — URINALYSIS, ROUTINE W REFLEX MICROSCOPIC
BILIRUBIN URINE: NEGATIVE
Glucose, UA: NEGATIVE mg/dL
Ketones, ur: 5 mg/dL — AB
Nitrite: NEGATIVE
PH: 5 (ref 5.0–8.0)
Protein, ur: 100 mg/dL — AB
SPECIFIC GRAVITY, URINE: 1.013 (ref 1.005–1.030)

## 2017-10-13 LAB — COMPREHENSIVE METABOLIC PANEL
ALBUMIN: 3.6 g/dL (ref 3.5–5.0)
ALT: 58 U/L — ABNORMAL HIGH (ref 14–54)
AST: 80 U/L — AB (ref 15–41)
Alkaline Phosphatase: 154 U/L — ABNORMAL HIGH (ref 38–126)
Anion gap: 28 — ABNORMAL HIGH (ref 5–15)
BUN: 124 mg/dL — AB (ref 6–20)
CHLORIDE: 91 mmol/L — AB (ref 101–111)
CO2: 9 mmol/L — ABNORMAL LOW (ref 22–32)
Calcium: 6.3 mg/dL — CL (ref 8.9–10.3)
Creatinine, Ser: 7.55 mg/dL — ABNORMAL HIGH (ref 0.44–1.00)
GFR calc Af Amer: 6 mL/min — ABNORMAL LOW (ref >60)
GFR, EST NON AFRICAN AMERICAN: 5 mL/min — AB (ref >60)
Glucose, Bld: 221 mg/dL — ABNORMAL HIGH (ref 65–99)
POTASSIUM: 4.9 mmol/L (ref 3.5–5.1)
Sodium: 128 mmol/L — ABNORMAL LOW (ref 135–145)
Total Bilirubin: 1.5 mg/dL — ABNORMAL HIGH (ref 0.3–1.2)
Total Protein: 6.8 g/dL (ref 6.5–8.1)

## 2017-10-13 LAB — PROTIME-INR
INR: 1.04
Prothrombin Time: 13.5 seconds (ref 11.4–15.2)

## 2017-10-13 LAB — RENAL FUNCTION PANEL
ALBUMIN: 3.1 g/dL — AB (ref 3.5–5.0)
ALBUMIN: 2.6 g/dL — AB (ref 3.5–5.0)
ANION GAP: 18 — AB (ref 5–15)
Anion gap: 22 — ABNORMAL HIGH (ref 5–15)
BUN: 126 mg/dL — ABNORMAL HIGH (ref 6–20)
BUN: 106 mg/dL — AB (ref 6–20)
CALCIUM: 5.9 mg/dL — AB (ref 8.9–10.3)
CHLORIDE: 99 mmol/L — AB (ref 101–111)
CO2: 11 mmol/L — ABNORMAL LOW (ref 22–32)
CO2: 14 mmol/L — ABNORMAL LOW (ref 22–32)
CREATININE: 7.11 mg/dL — AB (ref 0.44–1.00)
CREATININE: 5.92 mg/dL — AB (ref 0.44–1.00)
Calcium: 5.8 mg/dL — CL (ref 8.9–10.3)
Chloride: 104 mmol/L (ref 101–111)
GFR calc Af Amer: 8 mL/min — ABNORMAL LOW (ref >60)
GFR calc non Af Amer: 7 mL/min — ABNORMAL LOW (ref >60)
GFR, EST AFRICAN AMERICAN: 6 mL/min — AB (ref >60)
GFR, EST NON AFRICAN AMERICAN: 5 mL/min — AB (ref >60)
GLUCOSE: 152 mg/dL — AB (ref 65–99)
Glucose, Bld: 113 mg/dL — ABNORMAL HIGH (ref 65–99)
PHOSPHORUS: 5.7 mg/dL — AB (ref 2.5–4.6)
Phosphorus: 2.9 mg/dL (ref 2.5–4.6)
Potassium: 4.9 mmol/L (ref 3.5–5.1)
Potassium: 3.7 mmol/L (ref 3.5–5.1)
SODIUM: 136 mmol/L (ref 135–145)
Sodium: 132 mmol/L — ABNORMAL LOW (ref 135–145)

## 2017-10-13 LAB — CBC
HEMATOCRIT: 33.7 % — AB (ref 36.0–46.0)
HEMOGLOBIN: 11.4 g/dL — AB (ref 12.0–15.0)
MCH: 30.6 pg (ref 26.0–34.0)
MCHC: 33.8 g/dL (ref 30.0–36.0)
MCV: 90.6 fL (ref 78.0–100.0)
Platelets: 77 10*3/uL — ABNORMAL LOW (ref 150–400)
RBC: 3.72 MIL/uL — AB (ref 3.87–5.11)
RDW: 16.1 % — ABNORMAL HIGH (ref 11.5–15.5)
WBC: 2.7 10*3/uL — ABNORMAL LOW (ref 4.0–10.5)

## 2017-10-13 LAB — VOLATILES,BLD-ACETONE,ETHANOL,ISOPROP,METHANOL
Acetone, blood: 0.017 % — ABNORMAL HIGH (ref 0.000–0.010)
Ethanol, blood: NEGATIVE % (ref 0.000–0.010)
Isopropanol, blood: NEGATIVE % (ref 0.000–0.010)
Methanol, blood: NEGATIVE % (ref 0.000–0.010)

## 2017-10-13 LAB — HEMOGLOBIN AND HEMATOCRIT, BLOOD
HCT: 31.3 % — ABNORMAL LOW (ref 36.0–46.0)
HEMATOCRIT: 31 % — AB (ref 36.0–46.0)
HEMATOCRIT: 29 % — AB (ref 36.0–46.0)
HEMOGLOBIN: 10.6 g/dL — AB (ref 12.0–15.0)
Hemoglobin: 10.5 g/dL — ABNORMAL LOW (ref 12.0–15.0)
Hemoglobin: 9.6 g/dL — ABNORMAL LOW (ref 12.0–15.0)

## 2017-10-13 LAB — BLOOD GAS, ARTERIAL
ACID-BASE DEFICIT: 10.4 mmol/L — AB (ref 0.0–2.0)
Acid-base deficit: 16.4 mmol/L — ABNORMAL HIGH (ref 0.0–2.0)
Acid-base deficit: 15.6 mmol/L — ABNORMAL HIGH (ref 0.0–2.0)
BICARBONATE: 13.3 mmol/L — AB (ref 20.0–28.0)
Bicarbonate: 10.6 mmol/L — ABNORMAL LOW (ref 20.0–28.0)
Bicarbonate: 10.9 mmol/L — ABNORMAL LOW (ref 20.0–28.0)
Drawn by: 232811
Drawn by: 232811
Drawn by: 331471
FIO2: 35
FIO2: 100
LHR: 28 {breaths}/min
O2 Content: 2 L/min
O2 Saturation: 97.2 %
O2 Saturation: 97.1 %
O2 Saturation: 100 %
PATIENT TEMPERATURE: 97.4
PCO2 ART: 28.1 mmHg — AB (ref 32.0–48.0)
PCO2 ART: 28.3 mmHg — AB (ref 32.0–48.0)
PEEP/CPAP: 5 cmH2O
PH ART: 7.21 — AB (ref 7.350–7.450)
PO2 ART: 115 mmHg — AB (ref 83.0–108.0)
PO2 ART: 108 mmHg (ref 83.0–108.0)
PO2 ART: 335 mmHg — AB (ref 83.0–108.0)
Patient temperature: 98.2
Patient temperature: 98.6
VT: 460 mL
pCO2 arterial: 23.8 mmHg — ABNORMAL LOW (ref 32.0–48.0)
pH, Arterial: 7.196 — CL (ref 7.350–7.450)
pH, Arterial: 7.367 (ref 7.350–7.450)

## 2017-10-13 LAB — BASIC METABOLIC PANEL
ANION GAP: 18 — AB (ref 5–15)
ANION GAP: 18 — AB (ref 5–15)
BUN: 109 mg/dL — ABNORMAL HIGH (ref 6–20)
BUN: 105 mg/dL — ABNORMAL HIGH (ref 6–20)
CALCIUM: 5.2 mg/dL — AB (ref 8.9–10.3)
CO2: 13 mmol/L — ABNORMAL LOW (ref 22–32)
CO2: 14 mmol/L — AB (ref 22–32)
Calcium: 5.4 mg/dL — CL (ref 8.9–10.3)
Chloride: 105 mmol/L (ref 101–111)
Chloride: 103 mmol/L (ref 101–111)
Creatinine, Ser: 6.42 mg/dL — ABNORMAL HIGH (ref 0.44–1.00)
Creatinine, Ser: 5.94 mg/dL — ABNORMAL HIGH (ref 0.44–1.00)
GFR calc Af Amer: 8 mL/min — ABNORMAL LOW (ref >60)
GFR calc non Af Amer: 6 mL/min — ABNORMAL LOW (ref >60)
GFR calc non Af Amer: 7 mL/min — ABNORMAL LOW (ref >60)
GFR, EST AFRICAN AMERICAN: 7 mL/min — AB (ref >60)
GLUCOSE: 162 mg/dL — AB (ref 65–99)
Glucose, Bld: 147 mg/dL — ABNORMAL HIGH (ref 65–99)
POTASSIUM: 4.2 mmol/L (ref 3.5–5.1)
Potassium: 4.4 mmol/L (ref 3.5–5.1)
Sodium: 136 mmol/L (ref 135–145)
Sodium: 135 mmol/L (ref 135–145)

## 2017-10-13 LAB — GLUCOSE, CAPILLARY
GLUCOSE-CAPILLARY: 133 mg/dL — AB (ref 65–99)
Glucose-Capillary: 130 mg/dL — ABNORMAL HIGH (ref 65–99)
Glucose-Capillary: 133 mg/dL — ABNORMAL HIGH (ref 65–99)
Glucose-Capillary: 139 mg/dL — ABNORMAL HIGH (ref 65–99)

## 2017-10-13 LAB — ECHOCARDIOGRAM COMPLETE
HEIGHTINCHES: 65 in
WEIGHTICAEL: 2546.75 [oz_av]

## 2017-10-13 LAB — CREATININE, SERUM
Creatinine, Ser: 7.25 mg/dL — ABNORMAL HIGH (ref 0.44–1.00)
GFR calc Af Amer: 6 mL/min — ABNORMAL LOW (ref >60)
GFR, EST NON AFRICAN AMERICAN: 5 mL/min — AB (ref >60)

## 2017-10-13 LAB — CBC WITH DIFFERENTIAL/PLATELET
Basophils Absolute: 0 10*3/uL (ref 0.0–0.1)
Basophils Relative: 0 %
EOS PCT: 0 %
Eosinophils Absolute: 0 10*3/uL (ref 0.0–0.7)
HEMATOCRIT: 33.5 % — AB (ref 36.0–46.0)
HEMOGLOBIN: 11.4 g/dL — AB (ref 12.0–15.0)
LYMPHS ABS: 0.2 10*3/uL — AB (ref 0.7–4.0)
LYMPHS PCT: 7 %
MCH: 31 pg (ref 26.0–34.0)
MCHC: 34 g/dL (ref 30.0–36.0)
MCV: 91 fL (ref 78.0–100.0)
Monocytes Absolute: 0.5 10*3/uL (ref 0.1–1.0)
Monocytes Relative: 15 %
NEUTROS ABS: 2.6 10*3/uL (ref 1.7–7.7)
Neutrophils Relative %: 78 %
Platelets: 94 10*3/uL — ABNORMAL LOW (ref 150–400)
RBC: 3.68 MIL/uL — AB (ref 3.87–5.11)
RDW: 16 % — AB (ref 11.5–15.5)
WBC: 3.3 10*3/uL — AB (ref 4.0–10.5)

## 2017-10-13 LAB — RAPID URINE DRUG SCREEN, HOSP PERFORMED
Amphetamines: NOT DETECTED
BENZODIAZEPINES: NOT DETECTED
Barbiturates: NOT DETECTED
Cocaine: NOT DETECTED
OPIATES: NOT DETECTED
TETRAHYDROCANNABINOL: NOT DETECTED

## 2017-10-13 LAB — I-STAT CG4 LACTIC ACID, ED: Lactic Acid, Venous: 1.88 mmol/L (ref 0.5–1.9)

## 2017-10-13 LAB — TECHNOLOGIST SMEAR REVIEW: TECH REVIEW: INCREASED

## 2017-10-13 LAB — OSMOLALITY: Osmolality: 321 mOsm/kg (ref 275–295)

## 2017-10-13 LAB — ABO/RH: ABO/RH(D): O POS

## 2017-10-13 LAB — TROPONIN I: TROPONIN I: 0.15 ng/mL — AB (ref <0.03)

## 2017-10-13 LAB — MRSA PCR SCREENING: MRSA by PCR: NEGATIVE

## 2017-10-13 LAB — APTT: APTT: 34 s (ref 24–36)

## 2017-10-13 LAB — SALICYLATE LEVEL: Salicylate Lvl: <7 mg/dL (ref 2.8–30.0)

## 2017-10-13 LAB — TYPE AND SCREEN
ABO/RH(D): O POS
ANTIBODY SCREEN: NEGATIVE

## 2017-10-13 LAB — MAGNESIUM
Magnesium: 1.7 mg/dL (ref 1.7–2.4)
Magnesium: 1.3 mg/dL — ABNORMAL LOW (ref 1.7–2.4)
Magnesium: 1.4 mg/dL — ABNORMAL LOW (ref 1.7–2.4)

## 2017-10-13 LAB — OSMOLALITY, URINE: Osmolality, Ur: 365 mOsm/kg (ref 300–900)

## 2017-10-13 LAB — PROCALCITONIN: PROCALCITONIN: 2.84 ng/mL

## 2017-10-13 LAB — ACETAMINOPHEN LEVEL

## 2017-10-13 LAB — CK: CK TOTAL: 890 U/L — AB (ref 38–234)

## 2017-10-13 LAB — SODIUM, URINE, RANDOM: Sodium, Ur: 31 mmol/L

## 2017-10-13 LAB — POC OCCULT BLOOD, ED: Fecal Occult Bld: POSITIVE — AB

## 2017-10-13 LAB — CORTISOL: Cortisol, Plasma: 48.8 ug/dL

## 2017-10-13 LAB — CBG MONITORING, ED: Glucose-Capillary: 109 mg/dL — ABNORMAL HIGH (ref 65–99)

## 2017-10-13 LAB — AMMONIA: Ammonia: 35 umol/L (ref 9–35)

## 2017-10-13 LAB — ETHANOL: Alcohol, Ethyl (B): <10 mg/dL (ref <10)

## 2017-10-13 LAB — PHOSPHORUS: Phosphorus: 6.4 mg/dL — ABNORMAL HIGH (ref 2.5–4.6)

## 2017-10-13 MED ORDER — ASPIRIN 81 MG PO CHEW
324.0000 mg | CHEWABLE_TABLET | ORAL | Status: DC
  Filled 2017-10-13: qty 4

## 2017-10-13 MED ORDER — SODIUM CHLORIDE 0.9 % IV SOLN
50.0000 ug/h | INTRAVENOUS | Status: DC
  Administered 2017-10-13 – 2017-10-14 (×4): 50 ug/h via INTRAVENOUS
  Filled 2017-10-13 (×9): qty 1

## 2017-10-13 MED ORDER — SODIUM CHLORIDE 0.9 % IV SOLN: INTRAVENOUS | Status: DC

## 2017-10-13 MED ORDER — MIDAZOLAM HCL 2 MG/2ML IJ SOLN
2.0000 mg | Freq: Once | INTRAMUSCULAR | Status: AC
  Administered 2017-10-13: 2 mg via INTRAVENOUS

## 2017-10-13 MED ORDER — ADULT MULTIVITAMIN W/MINERALS CH: 1.0000 | ORAL_TABLET | Freq: Every day | ORAL | Status: DC

## 2017-10-13 MED ORDER — ORAL CARE MOUTH RINSE
15.0000 mL | Freq: Four times a day (QID) | OROMUCOSAL | Status: DC
  Administered 2017-10-13 – 2017-10-16 (×9): 15 mL via OROMUCOSAL

## 2017-10-13 MED ORDER — SODIUM BICARBONATE 8.4 % IV SOLN
INTRAVENOUS | Status: DC
  Filled 2017-10-13: qty 150

## 2017-10-13 MED ORDER — ASPIRIN 300 MG RE SUPP
300.0000 mg | RECTAL | Status: DC
  Administered 2017-10-13: 300 mg via RECTAL
  Filled 2017-10-13: qty 1

## 2017-10-13 MED ORDER — SODIUM CHLORIDE 0.9 % IV SOLN
80.0000 mg | Freq: Once | INTRAVENOUS | Status: AC
  Administered 2017-10-13: 01:00:00 80 mg via INTRAVENOUS
  Filled 2017-10-13: qty 80

## 2017-10-13 MED ORDER — BISACODYL 10 MG RE SUPP: 10.0000 mg | Freq: Every day | RECTAL | Status: DC | PRN

## 2017-10-13 MED ORDER — FENTANYL CITRATE (PF) 100 MCG/2ML IJ SOLN
INTRAMUSCULAR | Status: AC
  Administered 2017-10-13: 100 ug
  Filled 2017-10-13: qty 4

## 2017-10-13 MED ORDER — MIDAZOLAM HCL 2 MG/2ML IJ SOLN
INTRAMUSCULAR | Status: AC
  Administered 2017-10-13: 2 mg
  Filled 2017-10-13: qty 4

## 2017-10-13 MED ORDER — ROCURONIUM BROMIDE 50 MG/5ML IV SOLN
40.0000 mg | Freq: Once | INTRAVENOUS | Status: AC
  Administered 2017-10-13: 40 mg via INTRAVENOUS
  Filled 2017-10-13: qty 4

## 2017-10-13 MED ORDER — DOCUSATE SODIUM 50 MG/5ML PO LIQD: 100.0000 mg | Freq: Two times a day (BID) | ORAL | Status: DC | PRN

## 2017-10-13 MED ORDER — SODIUM CHLORIDE 0.9 % IV BOLUS
2000.0000 mL | Freq: Once | INTRAVENOUS | Status: AC
  Administered 2017-10-13: 2000 mL via INTRAVENOUS

## 2017-10-13 MED ORDER — SODIUM CHLORIDE 0.9 % IV BOLUS
1000.0000 mL | Freq: Once | INTRAVENOUS | Status: AC
  Administered 2017-10-13: 1000 mL via INTRAVENOUS

## 2017-10-13 MED ORDER — SODIUM CHLORIDE 0.9 % IV SOLN
Freq: Once | INTRAVENOUS | Status: AC
  Administered 2017-10-13: 06:00:00 via INTRAVENOUS
  Filled 2017-10-13: qty 1000

## 2017-10-13 MED ORDER — FOLIC ACID 5 MG/ML IJ SOLN
1.0000 mg | Freq: Every day | INTRAMUSCULAR | Status: DC
  Administered 2017-10-13 – 2017-10-15 (×3): 1 mg via INTRAVENOUS
  Filled 2017-10-13 (×4): qty 0.2

## 2017-10-13 MED ORDER — OCTREOTIDE LOAD VIA INFUSION
100.0000 ug | Freq: Once | INTRAVENOUS | Status: AC
  Administered 2017-10-13: 100 ug via INTRAVENOUS
  Filled 2017-10-13: qty 50

## 2017-10-13 MED ORDER — SODIUM CHLORIDE 0.9 % IV SOLN
2.0000 g | Freq: Once | INTRAVENOUS | Status: AC
  Administered 2017-10-13: 2 g via INTRAVENOUS
  Filled 2017-10-13: qty 20

## 2017-10-13 MED ORDER — CHLORHEXIDINE GLUCONATE 0.12% ORAL RINSE (MEDLINE KIT)
15.0000 mL | Freq: Two times a day (BID) | OROMUCOSAL | Status: DC
  Administered 2017-10-13 – 2017-10-15 (×4): 15 mL via OROMUCOSAL

## 2017-10-13 MED ORDER — SODIUM CHLORIDE 0.9 % IV SOLN
25.0000 ug/h | INTRAVENOUS | Status: DC
  Filled 2017-10-13: qty 50

## 2017-10-13 MED ORDER — SODIUM CHLORIDE 0.9 % IV BOLUS: 1000.0000 mL | Freq: Once | INTRAVENOUS | Status: DC

## 2017-10-13 MED ORDER — LORAZEPAM 2 MG/ML IJ SOLN: 2.0000 mg | INTRAMUSCULAR | Status: DC | PRN

## 2017-10-13 MED ORDER — MAGNESIUM SULFATE 2 GM/50ML IV SOLN
2.0000 g | Freq: Once | INTRAVENOUS | Status: AC
  Administered 2017-10-13: 2 g via INTRAVENOUS
  Filled 2017-10-13: qty 50

## 2017-10-13 MED ORDER — DEXTROSE-NACL 5-0.45 % IV SOLN: INTRAVENOUS | Status: DC

## 2017-10-13 MED ORDER — THIAMINE HCL 100 MG/ML IJ SOLN: 100.0000 mg | Freq: Every day | INTRAMUSCULAR | Status: DC

## 2017-10-13 MED ORDER — PHENYLEPHRINE HCL-NACL 10-0.9 MG/250ML-% IV SOLN
0.0000 ug/min | INTRAVENOUS | Status: DC
  Administered 2017-10-13: 8 ug/min via INTRAVENOUS
  Administered 2017-10-14: 20 ug/min via INTRAVENOUS
  Administered 2017-10-14: 30 ug/min via INTRAVENOUS
  Administered 2017-10-15: 40 ug/min via INTRAVENOUS
  Administered 2017-10-15 (×3): 30 ug/min via INTRAVENOUS
  Administered 2017-10-15: 40 ug/min via INTRAVENOUS
  Administered 2017-10-16: 30 ug/min via INTRAVENOUS
  Administered 2017-10-17: 25 ug/min via INTRAVENOUS
  Filled 2017-10-13 (×12): qty 250

## 2017-10-13 MED ORDER — HEPARIN SODIUM (PORCINE) 5000 UNIT/ML IJ SOLN: 5000.0000 [IU] | Freq: Three times a day (TID) | INTRAMUSCULAR | Status: DC

## 2017-10-13 MED ORDER — SODIUM BICARBONATE 8.4 % IV SOLN
INTRAVENOUS | Status: DC
  Administered 2017-10-13 (×3): via INTRAVENOUS
  Filled 2017-10-13 (×4): qty 150

## 2017-10-13 MED ORDER — THIAMINE HCL 100 MG/ML IJ SOLN
500.0000 mg | Freq: Three times a day (TID) | INTRAVENOUS | Status: AC
  Administered 2017-10-13 – 2017-10-14 (×6): 500 mg via INTRAVENOUS
  Filled 2017-10-13 (×7): qty 5

## 2017-10-13 MED ORDER — MIDAZOLAM HCL 2 MG/2ML IJ SOLN
1.0000 mg | INTRAMUSCULAR | Status: DC | PRN
  Administered 2017-10-14: 1 mg via INTRAVENOUS
  Filled 2017-10-13: qty 2

## 2017-10-13 MED ORDER — FENTANYL CITRATE (PF) 100 MCG/2ML IJ SOLN
100.0000 ug | Freq: Once | INTRAMUSCULAR | Status: AC
Start: 2017-10-13 — End: 2017-10-13
  Administered 2017-10-13: 100 ug via INTRAVENOUS

## 2017-10-13 MED ORDER — THIAMINE HCL 100 MG/ML IJ SOLN
100.0000 mg | Freq: Every day | INTRAMUSCULAR | Status: DC
Start: 2017-10-15 — End: 2017-10-13

## 2017-10-13 MED ORDER — LORAZEPAM 2 MG/ML IJ SOLN
1.0000 mg | INTRAMUSCULAR | Status: DC | PRN
  Administered 2017-10-13: 2 mg via INTRAVENOUS
  Filled 2017-10-13: qty 1

## 2017-10-13 MED ORDER — FENTANYL 2500MCG IN NS 250ML (10MCG/ML) PREMIX INFUSION
25.0000 ug/h | INTRAVENOUS | Status: DC
  Administered 2017-10-13 – 2017-10-14 (×2): 100 ug/h via INTRAVENOUS
  Filled 2017-10-13 (×2): qty 250

## 2017-10-13 MED ORDER — HEPARIN SODIUM (PORCINE) 1000 UNIT/ML DIALYSIS
1000.0000 [IU] | INTRAMUSCULAR | Status: DC | PRN
  Administered 2017-10-13 – 2017-10-17 (×2): 2400 [IU] via INTRAVENOUS_CENTRAL
  Filled 2017-10-13 (×3): qty 6

## 2017-10-13 MED ORDER — FENTANYL BOLUS VIA INFUSION
25.0000 ug | INTRAVENOUS | Status: DC | PRN
  Filled 2017-10-13: qty 25

## 2017-10-13 MED ORDER — MIDAZOLAM HCL 2 MG/2ML IJ SOLN
1.0000 mg | INTRAMUSCULAR | Status: DC | PRN
  Administered 2017-10-15: 1 mg via INTRAVENOUS
  Filled 2017-10-13: qty 2

## 2017-10-13 MED ORDER — ETOMIDATE 2 MG/ML IV SOLN
20.0000 mg | Freq: Once | INTRAVENOUS | Status: AC
  Administered 2017-10-13: 20 mg via INTRAVENOUS

## 2017-10-13 MED ORDER — SODIUM CHLORIDE 0.9 % IV SOLN
8.0000 mg/h | INTRAVENOUS | Status: DC
  Administered 2017-10-13 – 2017-10-14 (×4): 8 mg/h via INTRAVENOUS
  Filled 2017-10-13 (×9): qty 80

## 2017-10-13 MED ORDER — MIDAZOLAM HCL 2 MG/2ML IJ SOLN
INTRAMUSCULAR | Status: AC
  Filled 2017-10-13: qty 4

## 2017-10-13 MED ORDER — SODIUM CHLORIDE 0.9 % IV SOLN: 250.0000 mL | INTRAVENOUS | Status: DC | PRN

## 2017-10-13 NOTE — Progress Notes (Addendum)
[facility]  Heparin order placed.  Blood clots aspirated out of both red and blue ports of catheter.  Once clots aspirated additional 10cc blood aspirated from both ports with no clots present.  Both ports then flushed with 10cc NS each, 1.2 cc (1200 units) heparin instilled in each port, caps changed, ports taped and heparin label placed.

## 2017-10-13 NOTE — Progress Notes (Signed)
CRITICAL VALUE ALERT  Critical Value:  Ca-5.9  Date & Time Notied:  10/13/2017 9:41 PM  Provider Notified: [facility]  Orders Received/Actions taken:

## 2017-10-13 NOTE — ED Notes (Signed)
Date and time results received: 10/13/17 6:13 AM  (use smartphrase ".now" to insert current time)  Test: Troponin Critical Value: 0.15  Name of Provider Notified:   Orders Received? Or Actions Taken?: paging MD

## 2017-10-13 NOTE — Consult Note (Signed)
[facility] Renal Consultation Note  Requesting MD: Agarwala Indication for Consultation: AKI  HPI:  Ashley Bradley is a 67 y.o. female with past medical history significant for hypertension-on an ARB, depression, alcohol abuse.  She has no known kidney disease, creatinine in January 2018 was less than 0.5.  She presented to the emergency department early this a.m. with decreased mental status.  She was hypotensive.  Initial labs showed BUN of 124, creatinine 7.55, sodium 128 and bicarb of 9 -lactic acid of 1.88.  She was started on carbonate based fluid replacement.  Labs approximately 8 hours later still showing BUN of 126, creatinine 7.11, bicarb of 11 with sodium of 132.  Troponin slightly elevated.  There is only been 30 cc of urine recorded as of yet.  Urine analysis showing 100 of protein, moderate blood with 6-30 WBCs, 0-5 RBCs  Creatinine, Ser  Date/Time Value Ref Range Status  10/13/2017 09:13 AM 7.11 (H) 0.44 - 1.00 mg/dL Final  10/13/2017 05:11 AM 7.25 (H) 0.44 - 1.00 mg/dL Final  10/13/2017 12:41 AM 7.55 (H) 0.44 - 1.00 mg/dL Final  07/04/2016 05:33 AM 0.43 (L) 0.44 - 1.00 mg/dL Final  07/01/2016 04:54 AM 0.50 0.44 - 1.00 mg/dL Final  06/30/2016 04:50 AM 0.63 0.44 - 1.00 mg/dL Final  06/29/2016 05:11 PM 0.80 0.44 - 1.00 mg/dL Final  06/29/2016 03:00 PM 0.89 0.44 - 1.00 mg/dL Final  06/29/2016 09:34 AM 1.15 (H) 0.44 - 1.00 mg/dL Final  06/29/2016 06:54 AM 1.20 (H) 0.44 - 1.00 mg/dL Final  06/29/2016 01:40 AM 1.29 (H) 0.44 - 1.00 mg/dL Final  06/28/2016 04:47 PM 1.45 (H) 0.44 - 1.00 mg/dL Final  06/03/2016 10:36 AM 0.63 0.44 - 1.00 mg/dL Final  01/29/2016 05:19 AM 0.57 0.44 - 1.00 mg/dL Final  01/28/2016 05:00 AM 0.56 0.44 - 1.00 mg/dL Final  01/27/2016 04:45 AM 0.46 0.44 - 1.00 mg/dL Final  01/26/2016 07:00 AM 0.53 0.44 - 1.00 mg/dL Final  01/23/2016 05:46 AM 0.44 0.44 - 1.00 mg/dL Final  01/22/2016 04:52 AM 0.49 0.44 - 1.00 mg/dL Final  01/21/2016 05:13 AM  0.54 0.44 - 1.00 mg/dL Final  01/20/2016 05:17 AM 0.52 0.44 - 1.00 mg/dL Final  01/19/2016 03:29 AM 0.65 0.44 - 1.00 mg/dL Final  01/18/2016 03:18 AM 1.04 (H) 0.44 - 1.00 mg/dL Final    Comment:    RESULT REPEATED AND VERIFIED DELTA CHECK NOTED   01/17/2016 03:31 AM 2.25 (H) 0.44 - 1.00 mg/dL Final  01/16/2016 01:03 PM 2.19 (H) 0.44 - 1.00 mg/dL Final  01/16/2016 10:15 AM 2.40 (H) 0.44 - 1.00 mg/dL Final  09/23/2013 05:00 AM 0.46 (L) 0.50 - 1.10 mg/dL Final  09/22/2013 05:26 AM 0.49 (L) 0.50 - 1.10 mg/dL Final  09/21/2013 08:06 AM 0.48 (L) 0.50 - 1.10 mg/dL Final  09/21/2013 03:22 AM 0.50 0.50 - 1.10 mg/dL Final  09/20/2013 11:56 PM 0.50 0.50 - 1.10 mg/dL Final  09/20/2013 07:40 PM 0.45 (L) 0.50 - 1.10 mg/dL Final  09/20/2013 03:35 PM 0.45 (L) 0.50 - 1.10 mg/dL Final  09/20/2013 01:00 PM 0.46 (L) 0.50 - 1.10 mg/dL Final  03/14/2013 05:15 PM 0.79 0.50 - 1.10 mg/dL Final  02/02/2010 05:40 AM 0.72 0.4 - 1.2 mg/dL Final  02/01/2010 03:21 AM 1.05 0.4 - 1.2 mg/dL Final  01/31/2010 01:35 PM 1.05 0.4 - 1.2 mg/dL Final  01/31/2010 07:08 AM 1.11 0.4 - 1.2 mg/dL Final  09/08/2008 08:00 PM 0.71 0.4 - 1.2 mg/dL Final  09/07/2008 06:11 PM 0.69 0.4 - 1.2  mg/dL Final     PMHx:   Past Medical History:  Diagnosis Date  . Alcoholism ([place])   . Complication of anesthesia    hard time waking up  . CP (cerebral palsy) ([place])   . Depression   . Hypertension   . PONV (postoperative nausea and vomiting)   . 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 BLADDER SURGERY    . BREAST SURGERY    . TUBAL LIGATION      Family Hx:  Family History  Problem Relation Age of Onset  . CAD Mother 73  . Lung cancer Father 97    Social History:  reports that she has never smoked. She has never used smokeless tobacco. She reports that she drinks alcohol. She reports that she does not use drugs.  Allergies:  Allergies  Allergen Reactions  . Macrodantin  [Nitrofurantoin] Nausea And Vomiting    Medications: Prior to Admission medications   Medication Sig Start Date End Date Taking? Authorizing Provider  aspirin EC 81 MG tablet Take 81 mg by mouth daily.   Yes [provider]  FLUoxetine (PROZAC) 20 MG capsule Take 1 capsule (20 mg total) by mouth daily. Patient taking differently: Take 40 mg by mouth daily.  01/29/16  Yes Debbe Odea, MD  losartan (COZAAR) 50 MG tablet Take 1 tablet (50 mg total) by mouth daily. 01/29/16  Yes Debbe Odea, MD  saccharomyces boulardii (FLORASTOR) 250 MG capsule Take 250 mg by mouth 2 (two) times daily.   Yes [provider]  ALPRAZolam (XANAX) 0.5 MG tablet Take 1 tablet (0.5 mg total) by mouth 3 (three) times daily as needed for anxiety. Patient not taking: Reported on 10/13/2017 07/05/16   Oswald Hillock, MD  cephALEXin (KEFLEX) 500 MG capsule Take 1 capsule (500 mg total) by mouth 2 (two) times daily. Patient not taking: Reported on 10/13/2017 07/05/16   Oswald Hillock, MD  folic acid (FOLVITE) 1 MG tablet Take 1 tablet (1 mg total) by mouth daily. Patient not taking: Reported on 10/13/2017 07/06/16   Oswald Hillock, MD  Multiple Vitamin (MULTIVITAMIN WITH MINERALS) TABS tablet Take 1 tablet by mouth daily. Patient not taking: Reported on 10/13/2017 01/23/16   Dana Allan I, MD  phosphorus (K PHOS NEUTRAL) [phone] MG tablet Take 1 tablet (250 mg total) by mouth 2 (two) times daily. Patient not taking: Reported on 10/13/2017 07/05/16   Oswald Hillock, MD  thiamine 100 MG tablet Take 1 tablet (100 mg total) by mouth daily. Patient not taking: Reported on 10/13/2017 07/06/16   Oswald Hillock, MD    I have reviewed the patient's current medications.  Labs:  Results for orders placed or performed during the hospital encounter of 10/13/17 (from the past 48 hour(s))  Comprehensive metabolic panel     Status: Abnormal   Collection Time: 10/13/17 12:41 AM  Result Value Ref Range   Sodium 128 (L) 135 -  145 mmol/L   Potassium 4.9 3.5 - 5.1 mmol/L   Chloride 91 (L) 101 - 111 mmol/L   CO2 9 (L) 22 - 32 mmol/L   Glucose, Bld 221 (H) 65 - 99 mg/dL   BUN 124 (H) 6 - 20 mg/dL    Comment: RESULTS CONFIRMED BY MANUAL DILUTION   Creatinine, Ser 7.55 (H) 0.44 - 1.00 mg/dL   Calcium 6.3 (LL) 8.9 - 10.3 mg/dL    Comment: CRITICAL RESULT CALLED TO,  READ BACK BY AND VERIFIED WITH: N HOUILLIN,RN _0  10/13/17 MKELLY    Total Protein 6.8 6.5 - 8.1 g/dL   Albumin 3.6 3.5 - 5.0 g/dL   AST 80 (H) 15 - 41 U/L   ALT 58 (H) 14 - 54 U/L   Alkaline Phosphatase 154 (H) 38 - 126 U/L   Total Bilirubin 1.5 (H) 0.3 - 1.2 mg/dL   GFR calc non Af Amer 5 (L) >60 mL/min   GFR calc Af Amer 6 (L) >60 mL/min    Comment: (NOTE) The eGFR has been calculated using the CKD EPI equation. This calculation has not been validated in all clinical situations. eGFR's persistently <60 mL/min signify possible Chronic Kidney Disease.    Anion gap 28 (H) 5 - 15    Comment: Performed at [facility], [place] [address] [place], [place] [address]  CBC WITH DIFFERENTIAL     Status: Abnormal   Collection Time: 10/13/17 12:41 AM  Result Value Ref Range   WBC 3.3 (L) 4.0 - 10.5 K/uL   RBC 3.68 (L) 3.87 - 5.11 MIL/uL   Hemoglobin 11.4 (L) 12.0 - 15.0 g/dL   HCT 33.5 (L) 36.0 - 46.0 %   MCV 91.0 78.0 - 100.0 fL   MCH 31.0 26.0 - 34.0 pg   MCHC 34.0 30.0 - 36.0 g/dL   RDW 16.0 (H) 11.5 - 15.5 %   Platelets 94 (L) 150 - 400 K/uL    Comment: REPEATED TO VERIFY SPECIMEN CHECKED FOR CLOTS PLATELET COUNT CONFIRMED BY SMEAR    Neutrophils Relative % 78 %   Neutro Abs 2.6 1.7 - 7.7 K/uL   Lymphocytes Relative 7 %   Lymphs Abs 0.2 (L) 0.7 - 4.0 K/uL   Monocytes Relative 15 %   Monocytes Absolute 0.5 0.1 - 1.0 K/uL   Eosinophils Relative 0 %   Eosinophils Absolute 0.0 0.0 - 0.7 K/uL   Basophils Relative 0 %   Basophils Absolute 0.0 0.0 - 0.1 K/uL   WBC Morphology MILD LEFT SHIFT (1-5% METAS, OCC MYELO, OCC BANDS)      Comment: RARE NRBCs Performed at [facility], [place] [address] [place], [place] [address]   Protime-INR     Status: None   Collection Time: 10/13/17 12:41 AM  Result Value Ref Range   Prothrombin Time 13.5 11.4 - 15.2 seconds   INR 1.04     Comment: Performed at [facility], [place] [address] [place], [place] [address]  Type and screen [place]     Status: None   Collection Time: 10/13/17 12:41 AM  Result Value Ref Range   ABO/RH(D) O POS    Antibody Screen NEG    Sample Expiration      10/16/2017 Performed at [facility], [place] [address] [place], [place] [address]   APTT     Status: None   Collection Time: 10/13/17 12:41 AM  Result Value Ref Range   aPTT 34 24 - 36 seconds    Comment: Performed at [facility], [place] [address], [place], [place] [address]  Ethanol     Status: None   Collection Time: 10/13/17 12:51 AM  Result Value Ref Range   Alcohol, Ethyl (B) <10 <10 mg/dL    Comment:        LOWEST DETECTABLE LIMIT FOR SERUM ALCOHOL IS 10 mg/dL FOR MEDICAL PURPOSES ONLY Performed at [place] [address] [place], [place]  [address]   ABO/Rh     Status: None   Collection Time: 10/13/17 12:51 AM  Result Value Ref Range   ABO/RH(D)      O POS Performed at [facility], [place] [address], [place], [place] [address]   POC occult blood, ED     Status: Abnormal   Collection Time: 10/13/17  1:00 AM  Result Value Ref Range   Fecal Occult Bld POSITIVE (A) NEGATIVE  I-Stat CG4 Lactic Acid, ED     Status: None   Collection Time: 10/13/17  1:03 AM  Result Value Ref Range   Lactic Acid, Venous 1.88 0.5 - 1.9 mmol/L  Ammonia     Status: None   Collection Time: 10/13/17  1:30 AM  Result Value Ref Range   Ammonia 35 9 - 35 umol/L    Comment: Performed at [facility], [place] [address], [place], [place] [address]  Blood  gas, arterial     Status: Abnormal   Collection Time: 10/13/17  2:53 AM  Result Value Ref Range   O2 Content 2.0 L/min   Delivery systems OXYGEN MASK    pH, Arterial 7.196 (LL) 7.350 - 7.450    Comment: CRITICAL RESULT CALLED TO, READ BACK BY AND VERIFIED WITH: DR Wyvonnia Dusky AT 0302 BY ANNALISSA AGUSTIN,RRT,RCP ON 10/13/17    pCO2 arterial 28.1 (L) 32.0 - 48.0 mmHg   pO2, Arterial 115 (H) 83.0 - 108.0 mmHg   Bicarbonate 10.6 (L) 20.0 - 28.0 mmol/L   Acid-base deficit 16.4 (H) 0.0 - 2.0 mmol/L   O2 Saturation 97.2 %   Patient temperature 97.4    Collection site RIGHT RADIAL    Drawn by [id number]    Sample type ARTERIAL    Allens test (pass/fail) PASS PASS    Comment: Performed at [facility], [place] [address], [place], [place] [address]  CBC     Status: Abnormal   Collection Time: 10/13/17  5:11 AM  Result Value Ref Range   WBC 2.7 (L) 4.0 - 10.5 K/uL   RBC 3.72 (L) 3.87 - 5.11 MIL/uL   Hemoglobin 11.4 (L) 12.0 - 15.0 g/dL   HCT 33.7 (L) 36.0 - 46.0 %   MCV 90.6 78.0 - 100.0 fL   MCH 30.6 26.0 - 34.0 pg   MCHC 33.8 30.0 - 36.0 g/dL   RDW 16.1 (H) 11.5 - 15.5 %   Platelets 77 (L) 150 - 400 K/uL    Comment: CONSISTENT WITH PREVIOUS RESULT Performed at [place] [address], [place], [place] [address]   Creatinine, serum     Status: Abnormal   Collection Time: 10/13/17  5:11 AM  Result Value Ref Range   Creatinine, Ser 7.25 (H) 0.44 - 1.00 mg/dL   GFR calc non Af Amer 5 (L) >60 mL/min   GFR calc Af Amer 6 (L) >60 mL/min    Comment: (NOTE) The eGFR has been calculated using the CKD EPI equation. This calculation has not been validated in all clinical situations. eGFR's persistently <60 mL/min signify possible Chronic Kidney Disease. Performed at [facility], [place] [address], [place], [place] [address]   Osmolality     Status: Abnormal   Collection Time: 10/13/17  5:11 AM  Result Value Ref Range   Osmolality 321  (HH) 275 - 295 mOsm/kg    Comment: CRITICAL RESULT CALLED TO, READ BACK BY AND VERIFIED WITH: Haydee Monica, RN [phone] 10/13/2017 THOMPSON V Performed at [facility]  [facility], [place] [address] [place], [place] [address]   Culture, blood (Routine X 2) w Reflex to ID Panel     Status: None (Preliminary result)   Collection Time: 10/13/17  5:11 AM  Result Value Ref Range   Specimen Description      BLOOD LEFT HAND Performed at [place] [address] [place], [place] [address]    Special Requests      BOTTLES DRAWN AEROBIC AND ANAEROBIC Blood Culture adequate volume Performed at [place] [address] [place], [place] [address]    Culture      NO GROWTH < 12 HOURS Performed at [place] [address], [place], [place] [address]    Report Status PENDING   Technologist smear review     Status: None   Collection Time: 10/13/17  5:11 AM  Result Value Ref Range   Tech Review INCREASED BANDS (>20% BANDS)     Comment: RARE NRBCs Performed at [place] [address], [place],  [address]   Procalcitonin - Baseline     Status: None   Collection Time: 10/13/17  5:11 AM  Result Value Ref Range   Procalcitonin 2.84 ng/mL    Comment:        Interpretation: PCT > 2 ng/mL: Systemic infection (sepsis) is likely, unless other causes are known. (NOTE)       Sepsis PCT Algorithm           Lower Respiratory Tract                                      Infection PCT Algorithm    ----------------------------     ----------------------------         PCT < 0.25 ng/mL                PCT < 0.10 ng/mL         Strongly encourage             Strongly discourage   discontinuation of antibiotics    initiation of antibiotics    ----------------------------     -----------------------------       PCT 0.25 - 0.50 ng/mL            PCT 0.10 - 0.25 ng/mL               OR       >80% decrease in PCT            Discourage  initiation of                                            antibiotics      Encourage discontinuation           of antibiotics    ----------------------------     -----------------------------         PCT >= 0.50 ng/mL              PCT 0.26 - 0.50 ng/mL               AND       <80% decrease in PCT              Encourage initiation of  antibiotics       Encourage continuation           of antibiotics    ----------------------------     -----------------------------        PCT >= 0.50 ng/mL                  PCT > 0.50 ng/mL               AND         increase in PCT                  Strongly encourage                                      initiation of antibiotics    Strongly encourage escalation           of antibiotics                                     -----------------------------                                           PCT <= 0.25 ng/mL                                                 OR                                        > 80% decrease in PCT                                     Discontinue / Do not initiate                                             antibiotics Performed at [place] [address], [place], [place] [address]   Magnesium     Status: None   Collection Time: 10/13/17  5:11 AM  Result Value Ref Range   Magnesium 1.7 1.7 - 2.4 mg/dL    Comment: Performed at [facility], [place] [address] [place], [place] [address]  Phosphorus     Status: Abnormal   Collection Time: 10/13/17  5:11 AM  Result Value Ref Range   Phosphorus 6.4 (H) 2.5 - 4.6 mg/dL    Comment: Performed at [facility], [place] [address] [place], [place] [address]  Troponin I     Status: Abnormal   Collection Time: 10/13/17  5:11 AM  Result Value Ref Range   Troponin I 0.15 (HH) <0.03 ng/mL    Comment: CRITICAL RESULT CALLED TO, READ BACK BY AND VERIFIED WITH: S DOSTER,RN _0  10/13/17  MKELLY Performed at [facility], [place] [address] [place], [place] [address]   Acetaminophen  level     Status: Abnormal   Collection Time: 10/13/17  5:11 AM  Result Value Ref Range   Acetaminophen (Tylenol), Serum <10 (L) 10 - 30 ug/mL    Comment:        THERAPEUTIC CONCENTRATIONS VARY SIGNIFICANTLY. A RANGE OF 10-30 ug/mL MAY BE AN EFFECTIVE CONCENTRATION FOR MANY PATIENTS. HOWEVER, SOME ARE BEST TREATED AT CONCENTRATIONS OUTSIDE THIS RANGE. ACETAMINOPHEN CONCENTRATIONS >150 ug/mL AT 4 HOURS AFTER INGESTION AND >50 ug/mL AT 12 HOURS AFTER INGESTION ARE OFTEN ASSOCIATED WITH TOXIC REACTIONS. Performed at [facility], [place] [address], [place], [place] [address]   Salicylate level     Status: None   Collection Time: 10/13/17  5:11 AM  Result Value Ref Range   Salicylate Lvl <6.0 2.8 - 30.0 mg/dL    Comment: Performed at [facility], [place] [address], [place], [place] [address]  Volatiles,Blood (acetone,ethanol,isoprop,methanol)     Status: Abnormal   Collection Time: 10/13/17  5:11 AM  Result Value Ref Range   Acetone, blood 0.017 (H) 0.000 - 0.010 %    Comment: (NOTE)                                Detection Limit = 0.010 This test was developed and its performance characteristics determined by [facility]. It has not been cleared or approved by the Food and Drug Administration.    Ethanol, blood Negative 0.000 - 0.010 %    Comment:                                 Detection Limit = 0.010   Isopropanol, blood Negative 0.000 - 0.010 %    Comment:                                 Detection Limit = 0.010   Methanol, blood Negative 0.000 - 0.010 %    Comment: (NOTE) result was called to penny at [place] [facility] at 7:55am 10/13/17 by S.Grey.                                Detection Limit = 0.010 Performed At: [facility] [place], [place] [id number] Rush Farmer MD [id number] Performed at [facility], [place] [address] [place], [place] [address]   Cortisol     Status: None   Collection Time: 10/13/17  5:11 AM  Result Value Ref Range   Cortisol, Plasma 48.8 ug/dL    Comment: (NOTE) AM    6.7 - 22.6 ug/dL PM   <10.0       ug/dL Performed at [facility] [address], [place], [place] [address]   Culture, blood (Routine X 2) w Reflex to ID Panel     Status: None (Preliminary result)   Collection Time: 10/13/17  5:28 AM  Result Value Ref Range   Specimen Description      BLOOD RIGHT HAND Performed at [place] [address], [place], [place] [address]    Special Requests      BOTTLES DRAWN AEROBIC AND ANAEROBIC Blood Culture adequate volume Performed at [place] [address], [place],  [address]    Culture  NO GROWTH < 12 HOURS Performed at [place] [address] [place], [place] [address]    Report Status PENDING   CBG monitoring, ED     Status: Abnormal   Collection Time: 10/13/17  6:04 AM  Result Value Ref Range   Glucose-Capillary 109 (H) 65 - 99 mg/dL  Osmolality, urine     Status: None   Collection Time: 10/13/17  6:18 AM  Result Value Ref Range   Osmolality, Ur 365 300 - 900 mOsm/kg    Comment: Performed at [facility], [place] [address] [place], [place] [address]  Sodium, urine, random     Status: None   Collection Time: 10/13/17  6:18 AM  Result Value Ref Range   Sodium, Ur 31 mmol/L    Comment: Performed at [facility], [place] [address] [place], [place] [address]  Urine rapid drug screen (hosp performed)     Status: None   Collection Time: 10/13/17  6:18 AM  Result Value Ref Range   Opiates NONE DETECTED NONE DETECTED   Cocaine NONE DETECTED NONE DETECTED   Benzodiazepines NONE DETECTED NONE DETECTED   Amphetamines NONE DETECTED NONE DETECTED   Tetrahydrocannabinol NONE DETECTED NONE DETECTED   Barbiturates NONE DETECTED NONE  DETECTED    Comment: (NOTE) DRUG SCREEN FOR MEDICAL PURPOSES ONLY.  IF CONFIRMATION IS NEEDED FOR ANY PURPOSE, NOTIFY LAB WITHIN 5 DAYS. LOWEST DETECTABLE LIMITS FOR URINE DRUG SCREEN Drug Class                     Cutoff (ng/mL) Amphetamine and metabolites    1000 Barbiturate and metabolites    200 Benzodiazepine                 440 Tricyclics and metabolites     300 Opiates and metabolites        300 Cocaine and metabolites        300 THC                            50 Performed at [facility], [place] [address], [place], [place] [address]   Blood gas, arterial     Status: Abnormal   Collection Time: 10/13/17  6:50 AM  Result Value Ref Range   FIO2 35.00    Delivery systems VENTURI MASK    pH, Arterial 7.210 (L) 7.350 - 7.450   pCO2 arterial 28.3 (L) 32.0 - 48.0 mmHg   pO2, Arterial 108 83.0 - 108.0 mmHg   Bicarbonate 10.9 (L) 20.0 - 28.0 mmol/L   Acid-base deficit 15.6 (H) 0.0 - 2.0 mmol/L   O2 Saturation 97.1 %   Patient temperature 98.2    Collection site RIGHT RADIAL    Drawn by [id number]    Sample type ARTERIAL    Allens test (pass/fail) PASS PASS    Comment: Performed at [facility] [place] [address] [place], [place] [address]  MRSA PCR Screening     Status: None   Collection Time: 10/13/17  8:46 AM  Result Value Ref Range   MRSA by PCR NEGATIVE NEGATIVE    Comment:        The GeneXpert MRSA Assay (FDA approved for NASAL specimens only), is one component of a comprehensive MRSA colonization surveillance program. It is not intended to diagnose MRSA infection nor to guide or monitor treatment for MRSA infections. Performed at [facility], [place] [address] [place], [place] [address]   Hemoglobin and hematocrit, blood     Status: Abnormal   Collection Time: 10/13/17  9:13 AM  Result Value Ref Range   Hemoglobin 10.6 (L) 12.0 - 15.0 g/dL   HCT 31.0 (L) 36.0 - 46.0 %    Comment: Performed at [facility], [place] [address] [place], [place] [address]  Renal function panel     Status: Abnormal   Collection Time: 10/13/17  9:13 AM  Result Value Ref Range   Sodium 132 (L) 135 - 145 mmol/L    Comment: REPEATED TO VERIFY   Potassium 4.9 3.5 - 5.1 mmol/L   Chloride 99 (L) 101 - 111 mmol/L    Comment: REPEATED TO VERIFY   CO2 11 (L) 22 - 32 mmol/L    Comment: REPEATED TO VERIFY   Glucose, Bld 113 (H) 65 - 99 mg/dL   BUN 126 (H) 6 - 20 mg/dL    Comment: RESULTS CONFIRMED BY MANUAL DILUTION   Creatinine, Ser 7.11 (H) 0.44 - 1.00 mg/dL   Calcium 5.8 (LL) 8.9 - 10.3 mg/dL    Comment: CRITICAL RESULT CALLED TO, READ BACK BY AND VERIFIED WITH: MORGAN,M. RN AT 1009 10/13/17 MULLINS,T    Phosphorus 5.7 (H) 2.5 - 4.6 mg/dL   Albumin 3.1 (L) 3.5 - 5.0 g/dL   GFR calc non Af Amer 5 (L) >60 mL/min   GFR calc Af Amer 6 (L) >60 mL/min    Comment: (NOTE) The eGFR has been calculated using the CKD EPI equation. This calculation has not been validated in all clinical situations. eGFR's persistently <60 mL/min signify possible Chronic Kidney Disease.    Anion gap 22 (H) 5 - 15    Comment: REPEATED TO VERIFY Performed at [facility], [place] [address], [place], [place] [address]   Urinalysis, Routine w reflex microscopic     Status: Abnormal   Collection Time: 10/13/17 10:34 AM  Result Value Ref Range   Color, Urine YELLOW YELLOW   APPearance HAZY (A) CLEAR   Specific Gravity, Urine 1.013 1.005 - 1.030   pH 5.0 5.0 - 8.0   Glucose, UA NEGATIVE NEGATIVE mg/dL   Hgb urine dipstick MODERATE (A) NEGATIVE   Bilirubin Urine NEGATIVE NEGATIVE   Ketones, ur 5 (A) NEGATIVE mg/dL   Protein, ur 100 (A) NEGATIVE mg/dL   Nitrite NEGATIVE NEGATIVE   Leukocytes, UA SMALL (A) NEGATIVE   RBC / HPF 0-5 0 - 5 RBC/hpf   WBC, UA 6-30 0 - 5 WBC/hpf   Bacteria, UA FEW (A) NONE SEEN   Squamous Epithelial / LPF 0-5 (A) NONE SEEN   Mucus PRESENT    Hyaline Casts, UA PRESENT      Comment: Performed at [facility], [place] [address], [place], [place] [address]  Glucose, capillary     Status: Abnormal   Collection Time: 10/13/17 11:43 AM  Result Value Ref Range   Glucose-Capillary 130 (H) 65 - 99 mg/dL     ROS:  Review of systems not obtained due to patient factors.  Physical Exam: Vitals:   10/13/17 1147 10/13/17 1200  BP:  (!) 65/28  Pulse:  (!) 119  Resp:  (!) 33  Temp: 98.4 F (36.9 C)   SpO2:  92%     General: White female, snoring loudly, mild is encrusted and blood-she can follow some simple commands but is nonverbal HEENT: Pupils are equally round and reactive to light,  mucous membranes dry and blood crusted Neck: No JVD Heart: Tachycardia  Lungs: Her snoring limits exam Abdomen: Soft, nontender, nondistended  Extremities: no peripheral edema Skin: Warm and dry  Neuro: Very somnolent, slightly arousable but nonverbal  Assessment/Plan: 67 year old female with previous hypertension on ARB-presenting with significant volume depletion, hypotension and acute kidney injury 1.Renal-acute kidney injury-renal function just over a year ago was completely normal.  Now with significant volume depletion and hypotension, scenario would be consistent with ATN causing AKI.  If she was compliant with her ARB as an outpatient it could delay recovery.  I will also check a CK to see if there is any evidence of rhabdomyolysis.  She still appears dry, is receiving fluid boluses per CCM.  Is making a little bit more urine.  There are no urgent indications for dialysis as serum bicarb has increased slightly and pH is greater than 7.2.  Electrolytes okay.  There will be labs done later this afternoon to see what direction this is going in-  2. Hypertension/volume  -significantly volume depleted.  Receiving IVF 3.  Hyponatremia-hypovolemic hyponatremia.  Improving with fluid resuscitation 4.  Metabolic acidosis -secondary to renal failure.  Is improving  slightly with bicarb replacement-lactate was not significantly elevated   Yolanda Huffstetler A 10/13/2017, 12:24 PM

## 2017-10-13 NOTE — Progress Notes (Addendum)
Critical care attending progress note:  Reason for admission: obtundation.    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 Not feeling well for 2 weeks. Report coffee-ground emesis, diarrhea recently.   Present in hypovolemic shock and responded to iv fluids.  Obtundation felt to be secondary to uremia.    The following issues were addressed on rounds today:  1. AKI with creatinine now 7.55 with baseline of 0.43 in 1/18. Remains anuric despite +2.2L fluid balance. Na: 128, HCO3: 9. K: 4.9. On exam, no edema, JVP still flat and mucous membranes are still dry. Minimal urine output - Foley catheter in place. Chest is clear, Patient is tachypneic but O2 saturation 99% - Acidosis is driving RR.  - Anuric AKI with severe acidosis - would be an indication for acute dialysis. Nephrology consulted.  - Clinically still appears volume contracted.  Continue volume resuscitation.   2. Encephalopathy. Seems clearer than reported on admission. Alert and able to answer simple questions.  +++ tremulousness.  - CIWA for alcohol withdrawal.  3. Possible GI bleed. No active hematemesis. Some oral bleeding from dry mucous membranes. Abdomen tender in RUQ with no rebound.  Marginally elevated LFT's.  - Follow serial hemoglobin. - Will defer endoscopy until more stable unless overtly bleeding.  Safety and quality of care items:  Line removal: peripheral iv's only Stress ulcer prophylaxis: on PPI for possible UGIB, Octreotide. Laxative regimen: not indicated due to diarrhea. Nutritional status: Adequate. Enteral nutrition: NPO for now Foley catheter removal: required to measure urine output in response to fluids. Renal dose adjustment: no medications require adjustment. Electrolyte repletion: bicarbonate infusion. DVT prophylaxis: hold due to thrombocytopenia at 77 Transfusion indications: HB 11.4. No  indications for transfusion. Antibiotic de-escalation: on no antibiotics Sepsis care elements: not septic Glycemic target: 70-180 Steroid use: no systemic steroids Labs/CXR ordered: CMP, CBC daily  Summary and disposition:  Patient remains critically ill due to hypovolemic shock and severe acidosis requiring fluid resuscitation.  They remain at high risk for life/limb threatening clinical deterioration requiring frequent reassessment and modifications of care.  Services provided include examination of the patient, review of relevant ancillary tests, prescription of lifesaving therapies, review of medications and prophylactic therapy and multidisciplinary rounding.  Total critical care time excluding separately billable procedures: 45  Minutes.  Lynnell Catalanavi Dalisa Forrer, MD Critical Care Attending. [facility]. Pager: [phone] After hours: (336)    Addendum:  Ongoing episodes of hypotension this afternoon with increased WOB and increasing obtundation and difficulty controlling secretions. Tachycardic to 140's.  Phenylephrine initiated to keep MAP > 65. Decision made to intubate the patient urgently. Attempted to contact husband by phone but no answer. Proceeded with intubation and central line placement on emergent basis given patient's critical condition.   Improvement in hemodynamics following intubation. Will keep sedated overnight. Will need fluid removal prior to extubation.   An additional 30 min of critical care time was provided.

## 2017-10-13 NOTE — ED Triage Notes (Signed)
Pt from home called by family for decrease mental status and tremors. Pt drinks about 1/5 ETOH per day for the last 2 weeks but only drank 1/2 that amount today

## 2017-10-13 NOTE — Progress Notes (Signed)
Pt with 10 beat run vtach.  BP 96/53. Ca gluconate currently infusing.  Mg sulfate starting now.  BMP ordered after Ca and Mg repleted.  Will continue to monitor pt.

## 2017-10-13 NOTE — Procedures (Signed)
Intubation Procedure Note Ashley Bradley [id number] 12/30/1950  Procedure: Intubation Indications: Respiratory insufficiency  Procedure Details Consent: Unable to obtain consent because of emergent medical necessity. Time Out: Verified patient identification, verified procedure, site/side was marked, verified correct patient position, special equipment/implants available, medications/allergies/relevent history reviewed, required imaging and test results available.  Performed  Maximum sterile technique was used including n/a.  Glidescope with 7.5 ETT..  Single pass. Anterior larynx.    Evaluation Hemodynamic Status: BP stable throughout; O2 sats: transiently fell during during procedure Patient's Current Condition: stable Complications: No apparent complications Patient did tolerate procedure well. Chest X-ray ordered to verify placement.  CXR: tube position acceptable.   Ashley Bradley 10/13/2017

## 2017-10-13 NOTE — H&P (Addendum)
PULMONARY / CRITICAL CARE MEDICINE   Name: Ashley Bradley MRN: [id number] DOB: [birth year]    ADMISSION DATE:  10/13/2017 CONSULTATION DATE:  10/13/2017  REFERRING MD:  Dr. Wyvonnia Dusky   CHIEF COMPLAINT:  AMS  HISTORY OF PRESENT ILLNESS:   HPI obtained from medical chart review and partially from patient as patient is unable to completely vocalize complaints.  31 yoF with PMH of ETOH abuse, depression, HTN, ETOH related seizures, and CP presenting with decreased mental status.  Patient lives with husband at home.  It is difficult to understand patient secondary to extreme dry mouth.  States she has not felt well for 2 weeks. Reports of coffee ground emesis and diarrhea.  Last drink was 4/11.  Denies drinking rubbing alcohol or other ingestants, SI, fever, neck pain or stiffness, itching, SOB, chest pain, headache.      Labs noted for Na 128, Cl 91, glucose 221, BUN 124, sCr 7.55 (previously 0.43), AST 80, alk phos 154, t. Bili 1.5, WBC 3.3, Hgb 11.4, Plts 94, normal INR, ABG 7.196/ 28.7/ 10.6.  ETOH neg. CXR and CTH neg.  Urine pending. Patient has been hypotensive, tachypneic, and tachycardic, responsive to 2L NS.  PCCM called for admit.   PAST MEDICAL HISTORY :  She  has a past medical history of Alcoholism ([place]), Complication of anesthesia, CP (cerebral palsy) ([place]), Depression, Hypertension, PONV (postoperative nausea and vomiting), and Seizures ([place]).  PAST SURGICAL HISTORY: She  has a past surgical history that includes Breast surgery; Tubal ligation; and Bladder surgery.  Allergies  Allergen Reactions  . Macrodantin [Nitrofurantoin] Nausea And Vomiting    No current facility-administered medications on file prior to encounter.    Current Outpatient Medications on File Prior to Encounter  Medication Sig  . aspirin EC 81 MG tablet Take 81 mg by mouth daily.  [name] FLUoxetine (PROZAC) 20 MG capsule Take 1 capsule (20 mg total) by mouth daily. (Patient taking differently: Take 40 mg by  mouth daily. )  . losartan (COZAAR) 50 MG tablet Take 1 tablet (50 mg total) by mouth daily.  [name] saccharomyces boulardii (FLORASTOR) 250 MG capsule Take 250 mg by mouth 2 (two) times daily.  [name] ALPRAZolam (XANAX) 0.5 MG tablet Take 1 tablet (0.5 mg total) by mouth 3 (three) times daily as needed for anxiety. (Patient not taking: Reported on 10/13/2017)  . cephALEXin (KEFLEX) 500 MG capsule Take 1 capsule (500 mg total) by mouth 2 (two) times daily. (Patient not taking: Reported on 10/13/2017)  . folic acid (FOLVITE) 1 MG tablet Take 1 tablet (1 mg total) by mouth daily. (Patient not taking: Reported on 10/13/2017)  . Multiple Vitamin (MULTIVITAMIN WITH MINERALS) TABS tablet Take 1 tablet by mouth daily. (Patient not taking: Reported on 10/13/2017)  . phosphorus (K PHOS NEUTRAL) 155-852-130 MG tablet Take 1 tablet (250 mg total) by mouth 2 (two) times daily. (Patient not taking: Reported on 10/13/2017)  . thiamine 100 MG tablet Take 1 tablet (100 mg total) by mouth daily. (Patient not taking: Reported on 10/13/2017)    FAMILY HISTORY:  Her indicated that her mother is deceased. She indicated that her father is deceased.   SOCIAL HISTORY: She  reports that she has never smoked. She has never used smokeless tobacco. She reports that she drinks alcohol. She reports that she does not use drugs.  REVIEW OF SYSTEMS:   As per HPI  SUBJECTIVE:   VITAL SIGNS: BP (!) 86/51 (BP Location: Left Arm)   Pulse (!) 114  Temp 98.2 F (36.8 C) (Rectal)   Resp (!) 31   SpO2 100%   HEMODYNAMICS:    VENTILATOR SETTINGS: FiO2 (%):  [40 %] 40 %  INTAKE / OUTPUT: No intake/output data recorded.  PHYSICAL EXAMINATION: General:  Acutely ill appearing female lying in bed in moderate distress HEENT: MM pink/ extreme oral dryness/lips cracked, no JVD, pupils 3/reactive, barely audible speech secondary to dryness Neuro: Awake, oriented x 3, MAE, non focal, neg meningeal signs, +tremors CV: ST, no m/r/g PULM:  even/ shallow, tachypneic, lungs clear/ diminished GI: soft, mid diffuse upper abd tenderness, +bs Extremities: warm/dry, no edema  Skin: diffuse blanching rash- extending to palms of hands and soles, facial flushing  LABS:  BMET Recent Labs  Lab 10/13/17 0041  NA 128*  K 4.9  CL 91*  CO2 9*  BUN 124*  CREATININE 7.55*  GLUCOSE 221*    Electrolytes Recent Labs  Lab 10/13/17 0041  CALCIUM 6.3*    CBC Recent Labs  Lab 10/13/17 0041  WBC 3.3*  HGB 11.4*  HCT 33.5*  PLT 94*    Coag's Recent Labs  Lab 10/13/17 0041  APTT 34  INR 1.04    Sepsis Markers Recent Labs  Lab 10/13/17 0103  LATICACIDVEN 1.88    ABG Recent Labs  Lab 10/13/17 0253  PHART 7.196*  PCO2ART 28.1*  PO2ART 115*    Liver Enzymes Recent Labs  Lab 10/13/17 0041  AST 80*  ALT 58*  ALKPHOS 154*  BILITOT 1.5*  ALBUMIN 3.6    Cardiac Enzymes No results for input(s): TROPONINI, PROBNP in the last 168 hours.  Glucose No results for input(s): GLUCAP in the last 168 hours.  Imaging Ct Head Wo Contrast  Result Date: 10/13/2017 CLINICAL DATA:  Altered level of consciousness. EXAM: CT HEAD WITHOUT CONTRAST TECHNIQUE: Contiguous axial images were obtained from the base of the skull through the vertex without intravenous contrast. COMPARISON:  Head CT 01/26/2016.  Brain MRI 01/28/2016 FINDINGS: Brain: Similar mild atrophy and chronic small vessel ischemia. No intracranial hemorrhage, mass effect, or midline shift. No hydrocephalus. The basilar cisterns are patent. Physiologic basal gangliar calcifications unchanged. No evidence of territorial infarct or acute ischemia. No extra-axial or intracranial fluid collection. Vascular: No hyperdense vessel or unexpected calcification. Skull: No fracture or focal lesion. Sinuses/Orbits: Paranasal sinuses and mastoid air cells are clear. The visualized orbits are unremarkable. Other: None. IMPRESSION: 1.  No acute intracranial abnormality. 2. Stable  degree of atrophy and chronic small vessel ischemia. Electronically Signed   By: Jeb Levering M.D.   On: 10/13/2017 03:22   Dg Chest Port 1 View  Result Date: 10/13/2017 CLINICAL DATA:  Cough and shortness of breath. EXAM: PORTABLE CHEST 1 VIEW COMPARISON:  06/03/2016 FINDINGS: Low lung volumes.The cardiomediastinal contours are normal for portable AP technique pulmonary vasculature is normal. No consolidation, pleural effusion, or pneumothorax. No acute osseous abnormalities are seen. Cervical spine hardware is partially included. IMPRESSION: Low lung volumes without acute abnormality. Electronically Signed   By: Jeb Levering M.D.   On: 10/13/2017 01:51   STUDIES:  4/12 CTH >> neg  CULTURES: 4/12 BCx2 >>  ANTIBIOTICS: none  SIGNIFICANT EVENTS: 4/12 Admit  LINES/TUBES: PIV x 2   DISCUSSION: 13 yoF with chronic ETOH abuse presents from home with AMS.  C/o of not feeling well x 2 weeks.  Found to be in acute renal failure with severe AGMA.   ASSESSMENT / PLAN:  PULMONARY A: Acute hypoxic respiratory insufficiency P:  High risk for intubation- currently maintaining airway and mental status  Continue supplemental O2 PRN ABG prn Aspiration precautions  CARDIOVASCULAR A:  Hypotension- hypovolemic Prolonged QTC- 0.494 (baseline 0.48) Hx HTN - TTE 01/2016 EF 50-55,  - normal lactate P:  Tele monitoring Goal MAP > 65 Testing for blood volatiles  (acetone/ isopropanol/ methanol) Assess troponin Trend EKG q 6 x 3 Assess cortisol   RENAL A:   AKI Severe AGMA Hypovolemic Hyponatremia P:   Urine lytes pending Will consult renal  Start bicarb gtt Banana bag x 1 Then D5 1/2 NS at 50 ml Insert foley Trend BMP/ mag/ phos/ daily wts / urinary output Replace electrolytes as indicated  GASTROINTESTINAL A:   Concern for UGIB Transaminitis  Diarrhea  ETOH abuse P:   No obvious signs of bleeding Continue Protonix and octreotide gtt for now GI consult in  am Trend LFTs   HEMATOLOGIC A:   Leukopenia Thrombocytopenia Disseminated rash - blanching- likely 2/2 thrombocytopenia - denies any tick exposures  P:  Smear pending Trend H/H q 4 Transfuse for Hgb < 7 SCD only  Trend Coags- normal 4/12 HIV pending  INFECTIOUS A:   No clear source of infection - afebrile, CXR neg P:   Check urine  Assess PCT Send BC Trend WBC/ fever  ENDOCRINE A:   At risk for hypoglycemia P:   CBG q 4 SSI if needed  NEUROLOGIC A:   Acute encephalopathy-  Toxic/ metabolic ETOH abuse - last drink 4/11 Hx ETOH induced seizures, Depression, CP - ammonia 35 P:   RASS goal: 1 precedex for ETOH withdrawal Frequent neuro checks High dose thiamine 531m TID x 2 days (with Dextrose) then 100 daily Seizure precautions Ativan prn seizure UDS, tylenol and salicylate  level pending   FAMILY  - Updates: No family at bedside.    - Inter-disciplinary family meet or Palliative Care meeting due by: 4/19  CCT 60 mins  BKennieth Rad AGACNP-BC [facility] Pgr: [phone] if no answer [phone]/06/2018, 3:59 AM

## 2017-10-13 NOTE — Progress Notes (Signed)
CRITICAL VALUE ALERT  Critical Value:  Ca-5.2  Date & Time Notied:  10/13/2017 @ 1931  Provider Notified[facility] Dr[facility]ink[facility]Smith [facility]  Orders Received/Actions taken: Ionized Ca to be drawn

## 2017-10-13 NOTE — Plan of Care (Signed)
  Problem: Activity: Goal: Ability to tolerate increased activity will improve Outcome: Progressing   

## 2017-10-13 NOTE — Progress Notes (Signed)
  Echocardiogram 2D Echocardiogram has been performed.  Janalyn HarderWest, Navya Timmons R 10/13/2017, 2:58 PM

## 2017-10-13 NOTE — Procedures (Signed)
Central Venous Catheter Insertion Procedure Note Ashley BowensDarlene S Bradley [id number] 08-30-1950  Procedure: Insertion of Central Venous Catheter Indications: hemodialysis  Procedure Details Consent: Unable to obtain consent because of emergent medical necessity. Time Out: Verified patient identification, verified procedure, site/side was marked, verified correct patient position, special equipment/implants available, medications/allergies/relevent history reviewed, required imaging and test results available.  Performed  Maximum sterile technique was used including antiseptics, cap, gloves, gown, hand hygiene, mask and sheet. Skin prep: Chlorhexidine; local anesthetic administered A antimicrobial bonded/coated triple lumen catheter was placed in the right internal jugular vein using the Seldinger technique.  Evaluation Blood flow good Complications: No apparent complications Patient did tolerate procedure well. Chest X-ray ordered to verify placement.  CXR: normal.  Ashley Bradley 10/13/2017, 5:16 PM

## 2017-10-13 NOTE — ED Provider Notes (Signed)
[facility]-EMERGENCY DEPT Provider Note   CSN: [id number] Arrival date & time: 10/13/17  0029     History   Chief Complaint Chief Complaint  Patient presents with  . Weakness    generalized    HPI Ashley Bradley is a 67 y.o. female.  Level 5 caveat for altered mental status.  Patient from home with decreased mental status since about 6 PM.  Patient does have history of chronic alcohol abuse and normally drinks about 1/5 a day.  Last drink was today.  EMS reports coffee-ground emesis with altered mental status and increased confusion.  Initial blood sugar was 72.  Mental status is improved in route to the hospital.  Patient denies any pain.  Family is not available.  No chest pain or shortness of breath.  No fever.  The history is provided by the patient, the EMS personnel and a relative. The history is limited by the condition of the patient.  Weakness     Past Medical History:  Diagnosis Date  . Alcoholism (HCC)   . Complication of anesthesia    hard time waking up  . CP (cerebral palsy) (HCC)   . Depression   . Hypertension   . PONV (postoperative nausea and vomiting)   . Seizures (HCC)    ETOH induced    Patient Active Problem List   Diagnosis Date Noted  . Acute renal failure (ARF) (HCC) 06/29/2016  . Melena 06/29/2016  . Syncope 01/26/2016  . Prolonged Q-T interval on ECG 01/26/2016  . Hypokalemia 01/26/2016  . History of Clostridium difficile colitis 01/26/2016  . Elevated troponin   . Alcohol use disorder, moderate, in early remission, dependence (HCC) 01/21/2016  . Acute kidney injury (HCC) 01/16/2016  . Alcohol withdrawal (HCC) 01/16/2016  . Seizures (HCC) 09/24/2013  . UTI (urinary tract infection) 09/24/2013  . Hyponatremia 09/20/2013  . Major depression 03/19/2013  . Alcohol intoxication in relapsed alcoholic (HCC) 03/19/2013    Past Surgical History:  Procedure Laterality Date  . BLADDER SURGERY    . BREAST SURGERY    .  TUBAL LIGATION       OB History   None      Home Medications    Prior to Admission medications   Medication Sig Start Date End Date Taking? Authorizing Provider  ALPRAZolam Prudy Feeler) 0.5 MG tablet Take 1 tablet (0.5 mg total) by mouth 3 (three) times daily as needed for anxiety. 07/05/16   Meredeth Ide, MD  aspirin EC 81 MG tablet Take 81 mg by mouth daily.    [provider]  cephALEXin (KEFLEX) 500 MG capsule Take 1 capsule (500 mg total) by mouth 2 (two) times daily. 07/05/16   Meredeth Ide, MD  FLUoxetine (PROZAC) 20 MG capsule Take 1 capsule (20 mg total) by mouth daily. Patient taking differently: Take 40 mg by mouth daily.  01/29/16   Calvert Cantor, MD  folic acid (FOLVITE) 1 MG tablet Take 1 tablet (1 mg total) by mouth daily. 07/06/16   Meredeth Ide, MD  losartan (COZAAR) 50 MG tablet Take 1 tablet (50 mg total) by mouth daily. 01/29/16   Calvert Cantor, MD  Multiple Vitamin (MULTIVITAMIN WITH MINERALS) TABS tablet Take 1 tablet by mouth daily. 01/23/16   Barnetta Chapel, MD  phosphorus (K PHOS NEUTRAL) [phone] MG tablet Take 1 tablet (250 mg total) by mouth 2 (two) times daily. 07/05/16   Meredeth Ide, MD  saccharomyces boulardii (FLORASTOR) 250 MG capsule  Take 250 mg by mouth 2 (two) times daily.    [provider]  thiamine 100 MG tablet Take 1 tablet (100 mg total) by mouth daily. 07/06/16   Meredeth Ide, MD    Family History Family History  Problem Relation Age of Onset  . CAD Mother 57  . Lung cancer Father 61    Social History Social History   Tobacco Use  . Smoking status: Never Smoker  . Smokeless tobacco: Never Used  Substance Use Topics  . Alcohol use: Yes    Comment: 1-2 bottles wine per day  . Drug use: No     Allergies   Macrodantin [nitrofurantoin]   Review of Systems Review of Systems  Unable to perform ROS: Acuity of condition  Neurological: Positive for weakness.     Physical Exam Updated Vital Signs BP (!) 108/52  (BP Location: Left Arm)   Pulse (!) 132   Temp 98.2 F (36.8 C) (Rectal)   Resp (!) 33   SpO2 96%   Physical Exam  Constitutional: She is oriented to person, place, and time. She appears distressed.  Critically ill-appearing  HENT:  Head: Normocephalic and atraumatic.  Mouth/Throat: No oropharyngeal exudate.  Very dry mucous membranes, Rob mucosa along mouth  Eyes: Pupils are equal, round, and reactive to light. Conjunctivae and EOM are normal.  Neck: Normal range of motion. Neck supple.  No meningismus.  Cardiovascular: Normal rate, regular rhythm, normal heart sounds and intact distal pulses.  No murmur heard. Pulmonary/Chest: Effort normal and breath sounds normal. No respiratory distress. She exhibits no tenderness.  Abdominal: Soft. There is no tenderness. There is no rebound and no guarding.  Musculoskeletal: Normal range of motion. She exhibits no edema or tenderness.  Neurological: She is alert and oriented to person, place, and time. No cranial nerve deficit. She exhibits normal muscle tone. Coordination normal.  Patient is tremulous with asterixis.  She has 5/5 strength throughout and follows commands.  Speech appears dysarthric with is likely secondary to severe dry mouth.  Skin: Skin is warm. Rash noted.  Erythematous rash involving trunk extending to palms and soles  Psychiatric: She has a normal mood and affect. Her behavior is normal.  Nursing note and vitals reviewed.    ED Treatments / Results  Labs (all labs ordered are listed, but only abnormal results are displayed) Labs Reviewed  COMPREHENSIVE METABOLIC PANEL - Abnormal; Notable for the following components:      Result Value   Sodium 128 (*)    Chloride 91 (*)    CO2 9 (*)    Glucose, Bld 221 (*)    BUN 124 (*)    Creatinine, Ser 7.55 (*)    Calcium 6.3 (*)    AST 80 (*)    ALT 58 (*)    Alkaline Phosphatase 154 (*)    Total Bilirubin 1.5 (*)    GFR calc non Af Amer 5 (*)    GFR calc Af Amer 6  (*)    Anion gap 28 (*)    All other components within normal limits  CBC WITH DIFFERENTIAL/PLATELET - Abnormal; Notable for the following components:   WBC 3.3 (*)    RBC 3.68 (*)    Hemoglobin 11.4 (*)    HCT 33.5 (*)    RDW 16.0 (*)    Platelets 94 (*)    Lymphs Abs 0.2 (*)    All other components within normal limits  BLOOD GAS, ARTERIAL - Abnormal; Notable for  the following components:   pH, Arterial 7.196 (*)    pCO2 arterial 28.1 (*)    pO2, Arterial 115 (*)    Bicarbonate 10.6 (*)    Acid-base deficit 16.4 (*)    All other components within normal limits  CBC - Abnormal; Notable for the following components:   WBC 2.7 (*)    RBC 3.72 (*)    Hemoglobin 11.4 (*)    HCT 33.7 (*)    RDW 16.1 (*)    Platelets 77 (*)    All other components within normal limits  CREATININE, SERUM - Abnormal; Notable for the following components:   Creatinine, Ser 7.25 (*)    GFR calc non Af Amer 5 (*)    GFR calc Af Amer 6 (*)    All other components within normal limits  OSMOLALITY - Abnormal; Notable for the following components:   Osmolality 321 (*)    All other components within normal limits  PHOSPHORUS - Abnormal; Notable for the following components:   Phosphorus 6.4 (*)    All other components within normal limits  TROPONIN I - Abnormal; Notable for the following components:   Troponin I 0.15 (*)    All other components within normal limits  ACETAMINOPHEN LEVEL - Abnormal; Notable for the following components:   Acetaminophen (Tylenol), Serum <10 (*)    All other components within normal limits  BLOOD GAS, ARTERIAL - Abnormal; Notable for the following components:   pH, Arterial 7.210 (*)    pCO2 arterial 28.3 (*)    Bicarbonate 10.9 (*)    Acid-base deficit 15.6 (*)    All other components within normal limits  POC OCCULT BLOOD, ED - Abnormal; Notable for the following components:   Fecal Occult Bld POSITIVE (*)    All other components within normal limits  CBG  MONITORING, ED - Abnormal; Notable for the following components:   Glucose-Capillary 109 (*)    All other components within normal limits  CULTURE, BLOOD (ROUTINE X 2)  CULTURE, BLOOD (ROUTINE X 2)  PROTIME-INR  APTT  AMMONIA  ETHANOL  SODIUM, URINE, RANDOM  RAPID URINE DRUG SCREEN, HOSP PERFORMED  TECHNOLOGIST SMEAR REVIEW  PROCALCITONIN  MAGNESIUM  SALICYLATE LEVEL  CORTISOL  OSMOLALITY, URINE  VOLATILES,BLD-ACETONE,ETHANOL,ISOPROP,METHANOL  HEMOGLOBIN AND HEMATOCRIT, BLOOD  HEMOGLOBIN AND HEMATOCRIT, BLOOD  HEMOGLOBIN AND HEMATOCRIT, BLOOD  RENAL FUNCTION PANEL  RENAL FUNCTION PANEL  I-STAT CG4 LACTIC ACID, ED  TYPE AND SCREEN  ABO/RH    EKG EKG Interpretation  Date/Time:  Friday October 13 2017 00:53:44 EDT Ventricular Rate:  117 PR Interval:    QRS Duration: 101 QT Interval:  354 QTC Calculation: 494 R Axis:   74 Text Interpretation:  Sinus tachycardia Probable left atrial enlargement Nonspecific T abnrm, anterolateral leads Borderline prolonged QT interval Rate faster Confirmed by Glynn Octave [phone]) on 10/13/2017 1:17:48 AM   Radiology Dg Chest Port 1 View  Result Date: 10/13/2017 CLINICAL DATA:  Cough and shortness of breath. EXAM: PORTABLE CHEST 1 VIEW COMPARISON:  06/03/2016 FINDINGS: Low lung volumes.The cardiomediastinal contours are normal for portable AP technique pulmonary vasculature is normal. No consolidation, pleural effusion, or pneumothorax. No acute osseous abnormalities are seen. Cervical spine hardware is partially included. IMPRESSION: Low lung volumes without acute abnormality. Electronically Signed   By: Rubye Oaks M.D.   On: 10/13/2017 01:51    Procedures Procedures (including critical care time)  Medications Ordered in ED Medications  sodium chloride 0.9 % bolus 1,000 mL (has no administration  in time range)    And  sodium chloride 0.9 % bolus 1,000 mL (has no administration in time range)    And  0.9 %  sodium chloride  infusion (has no administration in time range)  pantoprazole (PROTONIX) 80 mg in sodium chloride 0.9 % 100 mL IVPB (has no administration in time range)  pantoprazole (PROTONIX) 80 mg in sodium chloride 0.9 % 250 mL (0.32 mg/mL) infusion (has no administration in time range)  octreotide (SANDOSTATIN) 2 mcg/mL load via infusion 100 mcg (100 mcg Intravenous Bolus from Bag 10/13/17 0112)    And  octreotide (SANDOSTATIN) 500 mcg in sodium chloride 0.9 % 250 mL (2 mcg/mL) infusion (50 mcg/hr Intravenous New Bag/Given 10/13/17 0115)     Initial Impression / Assessment and Plan / ED Course  I have reviewed the triage vital signs and the nursing notes.  Pertinent labs & imaging results that were available during my care of the patient were reviewed by me and considered in my medical decision making (see chart for details).    Patient from home with apparent severe dehydration and altered mental status.  She is an alcoholic.  Husband at bedside reports she was last normal "2 weeks ago".  She has not been eating and drinking and just been abusing alcohol.  Patient has very dry mucous membranes which appear to be causing her dysarthria.  She is tachycardic and hypotensive.  She is aggressively hydrated.  There is concern for coffee-ground emesis at home.  Patient started on IV Protonix and IV octreotide.  Labs show significant acute renal failure and uremia.  She will be started on bicarb infusion.  Bicarb is 9 with pH is 7.1.   Hemoccult is positive but there is no ongoing evidence of GI bleed.  We will continue PPI and octreotide for now.  Patient will be aggressively hydrated and started on bicarb infusion.  CT head is stable.  Patient continues to protect airway and answers questions appropriately.  Does not need intubation at this time.  ABG consistent with severe metabolic acidosis.  Patient will be admitted to ICU.  Family updated. D/w Dr. Marchelle Gearingamaswamy.  CRITICAL CARE Performed by: Glynn OctaveANCOUR,  Quintavious Rinck Total critical care time: 60 minutes Critical care time was exclusive of separately billable procedures and treating other patients. Critical care was necessary to treat or prevent imminent or life-threatening deterioration. Critical care was time spent personally by me on the following activities: development of treatment plan with patient and/or surrogate as well as nursing, discussions with consultants, evaluation of patient's response to treatment, examination of patient, obtaining history from patient or surrogate, ordering and performing treatments and interventions, ordering and review of laboratory studies, ordering and review of radiographic studies, pulse oximetry and re-evaluation of patient's condition.   Final Clinical Impressions(s) / ED Diagnoses   Final diagnoses:  Cough  Metabolic acidosis  AKI (acute kidney injury) [facility](HCC)    ED Discharge Orders    None       Glynn Octaveancour, Antony Sian, MD 10/13/17 [phone]

## 2017-10-13 NOTE — ED Notes (Signed)
Bed: RESB Expected date:  Expected time:  Means of arrival:  Comments: 67 yo F/possible alcohol withdrawal

## 2017-10-14 LAB — BASIC METABOLIC PANEL
Anion gap: 17 — ABNORMAL HIGH (ref 5–15)
Anion gap: 19 — ABNORMAL HIGH (ref 5–15)
Anion gap: 16 — ABNORMAL HIGH (ref 5–15)
Anion gap: 15 (ref 5–15)
BUN: 107 mg/dL — AB (ref 6–20)
BUN: 104 mg/dL — AB (ref 6–20)
BUN: 105 mg/dL — AB (ref 6–20)
BUN: 105 mg/dL — AB (ref 6–20)
CHLORIDE: 106 mmol/L (ref 101–111)
CO2: 16 mmol/L — ABNORMAL LOW (ref 22–32)
CO2: 17 mmol/L — ABNORMAL LOW (ref 22–32)
CO2: 19 mmol/L — ABNORMAL LOW (ref 22–32)
CO2: 18 mmol/L — AB (ref 22–32)
CREATININE: 5.72 mg/dL — AB (ref 0.44–1.00)
CREATININE: 5.8 mg/dL — AB (ref 0.44–1.00)
CREATININE: 5.73 mg/dL — AB (ref 0.44–1.00)
CREATININE: 5.58 mg/dL — AB (ref 0.44–1.00)
Calcium: 6.5 mg/dL — ABNORMAL LOW (ref 8.9–10.3)
Calcium: 6.6 mg/dL — ABNORMAL LOW (ref 8.9–10.3)
Calcium: 6.5 mg/dL — ABNORMAL LOW (ref 8.9–10.3)
Calcium: 6.4 mg/dL — CL (ref 8.9–10.3)
Chloride: 103 mmol/L (ref 101–111)
Chloride: 101 mmol/L (ref 101–111)
Chloride: 102 mmol/L (ref 101–111)
GFR calc Af Amer: 8 mL/min — ABNORMAL LOW (ref >60)
GFR calc Af Amer: 8 mL/min — ABNORMAL LOW (ref >60)
GFR calc Af Amer: 8 mL/min — ABNORMAL LOW (ref >60)
GFR calc Af Amer: 8 mL/min — ABNORMAL LOW (ref >60)
GFR calc non Af Amer: 7 mL/min — ABNORMAL LOW (ref >60)
GFR calc non Af Amer: 7 mL/min — ABNORMAL LOW (ref >60)
GFR, EST NON AFRICAN AMERICAN: 7 mL/min — AB (ref >60)
GFR, EST NON AFRICAN AMERICAN: 7 mL/min — AB (ref >60)
Glucose, Bld: 131 mg/dL — ABNORMAL HIGH (ref 65–99)
Glucose, Bld: 121 mg/dL — ABNORMAL HIGH (ref 65–99)
Glucose, Bld: 105 mg/dL — ABNORMAL HIGH (ref 65–99)
Glucose, Bld: 87 mg/dL (ref 65–99)
POTASSIUM: 3.4 mmol/L — AB (ref 3.5–5.1)
Potassium: 3.1 mmol/L — ABNORMAL LOW (ref 3.5–5.1)
Potassium: 2.9 mmol/L — ABNORMAL LOW (ref 3.5–5.1)
Potassium: 3.3 mmol/L — ABNORMAL LOW (ref 3.5–5.1)
SODIUM: 136 mmol/L (ref 135–145)
SODIUM: 137 mmol/L (ref 135–145)
SODIUM: 137 mmol/L (ref 135–145)
Sodium: 139 mmol/L (ref 135–145)

## 2017-10-14 LAB — GLUCOSE, CAPILLARY
GLUCOSE-CAPILLARY: 127 mg/dL — AB (ref 65–99)
GLUCOSE-CAPILLARY: 109 mg/dL — AB (ref 65–99)
GLUCOSE-CAPILLARY: 89 mg/dL (ref 65–99)
Glucose-Capillary: 89 mg/dL (ref 65–99)
Glucose-Capillary: 87 mg/dL (ref 65–99)

## 2017-10-14 LAB — RENAL FUNCTION PANEL
ALBUMIN: 2.2 g/dL — AB (ref 3.5–5.0)
ANION GAP: 14 (ref 5–15)
BUN: 104 mg/dL — ABNORMAL HIGH (ref 6–20)
CHLORIDE: 106 mmol/L (ref 101–111)
CO2: 19 mmol/L — AB (ref 22–32)
Calcium: 6.6 mg/dL — ABNORMAL LOW (ref 8.9–10.3)
Creatinine, Ser: 5.58 mg/dL — ABNORMAL HIGH (ref 0.44–1.00)
GFR calc Af Amer: 8 mL/min — ABNORMAL LOW (ref >60)
GFR calc non Af Amer: 7 mL/min — ABNORMAL LOW (ref >60)
GLUCOSE: 83 mg/dL (ref 65–99)
POTASSIUM: 3.3 mmol/L — AB (ref 3.5–5.1)
Sodium: 139 mmol/L (ref 135–145)

## 2017-10-14 LAB — CBC
HEMATOCRIT: 26.2 % — AB (ref 36.0–46.0)
HEMOGLOBIN: 9.2 g/dL — AB (ref 12.0–15.0)
MCH: 31 pg (ref 26.0–34.0)
MCHC: 35.1 g/dL (ref 30.0–36.0)
MCV: 88.2 fL (ref 78.0–100.0)
Platelets: 80 10*3/uL — ABNORMAL LOW (ref 150–400)
RBC: 2.97 MIL/uL — ABNORMAL LOW (ref 3.87–5.11)
RDW: 16.5 % — ABNORMAL HIGH (ref 11.5–15.5)
WBC: 4.9 10*3/uL (ref 4.0–10.5)

## 2017-10-14 LAB — CALCIUM, IONIZED: Calcium, Ionized, Serum: 3.3 mg/dL — ABNORMAL LOW (ref 4.5–5.6)

## 2017-10-14 LAB — MAGNESIUM: Magnesium: 1.8 mg/dL (ref 1.7–2.4)

## 2017-10-14 MED ORDER — PRO-STAT SUGAR FREE PO LIQD
30.0000 mL | Freq: Two times a day (BID) | ORAL | Status: DC
  Administered 2017-10-14 – 2017-10-15 (×3): 30 mL
  Filled 2017-10-14 (×3): qty 30

## 2017-10-14 MED ORDER — PRISMASOL BGK 4/2.5 32-4-2.5 MEQ/L IV SOLN
INTRAVENOUS | Status: DC
  Administered 2017-10-14 – 2017-10-15 (×2): via INTRAVENOUS_CENTRAL
  Filled 2017-10-14 (×4): qty 5000

## 2017-10-14 MED ORDER — PRISMASOL BGK 4/2.5 32-4-2.5 MEQ/L IV SOLN
INTRAVENOUS | Status: DC
  Administered 2017-10-14 – 2017-10-16 (×3): via INTRAVENOUS_CENTRAL
  Filled 2017-10-14 (×4): qty 5000

## 2017-10-14 MED ORDER — ADULT MULTIVITAMIN LIQUID CH
15.0000 mL | Freq: Every day | ORAL | Status: DC
  Administered 2017-10-14 – 2017-10-15 (×2): 15 mL via ORAL
  Filled 2017-10-14 (×3): qty 15

## 2017-10-14 MED ORDER — CHLORHEXIDINE GLUCONATE CLOTH 2 % EX PADS
6.0000 | MEDICATED_PAD | Freq: Every day | CUTANEOUS | Status: DC
  Administered 2017-10-14 – 2017-10-26 (×12): 6 via TOPICAL

## 2017-10-14 MED ORDER — SODIUM CHLORIDE 0.9 % IV SOLN
2.0000 g | Freq: Once | INTRAVENOUS | Status: AC
  Administered 2017-10-14: 2 g via INTRAVENOUS
  Filled 2017-10-14: qty 20

## 2017-10-14 MED ORDER — SODIUM CHLORIDE 0.9% FLUSH: 10.0000 mL | INTRAVENOUS | Status: DC | PRN

## 2017-10-14 MED ORDER — ALTEPLASE 2 MG IJ SOLR: 2.0000 mg | Freq: Once | INTRAMUSCULAR | Status: DC | PRN

## 2017-10-14 MED ORDER — PRISMASOL BGK 4/2.5 32-4-2.5 MEQ/L IV SOLN
INTRAVENOUS | Status: DC
  Administered 2017-10-14 – 2017-10-15 (×10): via INTRAVENOUS_CENTRAL
  Filled 2017-10-14 (×14): qty 5000

## 2017-10-14 MED ORDER — PANTOPRAZOLE SODIUM 40 MG PO PACK
40.0000 mg | PACK | Freq: Every day | ORAL | Status: DC
  Administered 2017-10-14 – 2017-10-15 (×2): 40 mg
  Filled 2017-10-14 (×2): qty 20

## 2017-10-14 MED ORDER — VITAL HIGH PROTEIN PO LIQD
1000.0000 mL | ORAL | Status: DC
  Administered 2017-10-14: 1000 mL
  Filled 2017-10-14 (×2): qty 1000

## 2017-10-14 MED ORDER — HEPARIN SODIUM (PORCINE) 1000 UNIT/ML DIALYSIS: 1000.0000 [IU] | INTRAMUSCULAR | Status: DC | PRN

## 2017-10-14 MED ORDER — ACETAMINOPHEN 160 MG/5ML PO SOLN
650.0000 mg | Freq: Four times a day (QID) | ORAL | Status: DC | PRN
  Administered 2017-10-14 – 2017-10-20 (×2): 650 mg
  Filled 2017-10-14 (×4): qty 20.3

## 2017-10-14 MED ORDER — POTASSIUM CHLORIDE 20 MEQ/15ML (10%) PO SOLN
40.0000 meq | Freq: Two times a day (BID) | ORAL | Status: DC
  Administered 2017-10-14 – 2017-10-15 (×4): 40 meq via ORAL
  Filled 2017-10-14 (×4): qty 30

## 2017-10-14 MED ORDER — SODIUM CHLORIDE 0.9% FLUSH
10.0000 mL | Freq: Two times a day (BID) | INTRAVENOUS | Status: DC
Start: 2017-10-14 — End: 2017-10-26
  Administered 2017-10-14 – 2017-10-19 (×7): 10 mL
  Administered 2017-10-19: 20 mL
  Administered 2017-10-20 – 2017-10-26 (×10): 10 mL

## 2017-10-14 MED ORDER — SODIUM CHLORIDE 0.9 % FOR CRRT: INTRAVENOUS_CENTRAL | Status: DC | PRN

## 2017-10-14 NOTE — Progress Notes (Signed)
[facility] Progress Note  Subjective: no sig UOP, 9.3 L in and 180 cc out yesterday.  Wt's up 14 kg over admit wt.   Vitals:   10/14/17 1200 10/14/17 1215 10/14/17 1230 10/14/17 1245  BP: 120/62 (!) 96/53 (!) 95/57 (!) 99/54  Pulse: (!) 121 (!) 125 (!) 129 (!) 128  Resp: (!) 28 (!) 28 (!) 28 (!) 28  Temp: (!) 100.6 F (38.1 C)     TempSrc: Axillary     SpO2: 97% 95% 95% 95%  Weight:      Height:        Inpatient medications: . chlorhexidine gluconate (MEDLINE KIT)  15 mL Mouth Rinse BID  . Chlorhexidine Gluconate Cloth  6 each Topical Daily  . folic acid  1 mg Intravenous Daily  . mouth rinse  15 mL Mouth Rinse QID  . multivitamin  15 mL Oral Daily  . potassium chloride  40 mEq Oral BID  . sodium chloride flush  10-40 mL Intracatheter Q12H   . fentaNYL infusion INTRAVENOUS 50 mcg/hr (10/14/17 1039)  . octreotide  (SANDOSTATIN)    IV infusion 50 mcg/hr (10/14/17 0736)  . pantoprozole (PROTONIX) infusion 8 mg/hr (10/14/17 1000)  . phenylephrine (NEO-SYNEPHRINE) Adult infusion Stopped (10/14/17 1212)  . thiamine injection Stopped (10/14/17 1050)   bisacodyl, docusate, fentaNYL, heparin, LORazepam, midazolam, midazolam, sodium chloride flush  Exam: On the vent, responsive No jvd Chest coarse bs bilat RRR Abd obese ntnd no ascites Ext diffuse 2-3+ edema x 4 ext NF, moves all ext, lethargic  UA 4/12 > mod Hb, 100 prot, 0-5 rbc/ 6-30 wbc UNa 31 ECHO - LV 55-60%, PA peak 59 mm Hg, G1DD CXR 4/12   1 am > no acute disease CXR 4/12   5 pm > new vasc congestion, some IS edema, possible LLL infiltrate      Impression: 1  Renal failure - presumable acute due to ATN from vol depletion/ shock/ ARB/ hypoperfusion. Renal US shows normal sized kidneys, no hydro.  UA is unremarkable.  Minimal UOP, improvement in creat from 4/11 to this am is not from improved renal fxn but from dilutional effect.  Up 14 kg from admission, sig vol overload.  CVP 14.  Needs RRT, will  start CRRT for solute and volume control.  No heparin given low plts.   2  Shock - prob septic, weaning pressors 3  ETOH abuse 4  Hypocalcemia - a little better 5  VDRF 6  Metabolic acidosis - improved by serial ABG 7  Volume overload - CVP up, diffuse edema. Will dc bicarb gtt, plan small UF w CRRT to see how she tolerates it.   Plan - CRRT , see orders   Kelly Splinter MD [facility] pager [phone]   10/14/2017, 1:21 PM   Recent Labs  Lab 10/13/17 0511 10/13/17 0913  10/13/17 2000 10/14/17 0115 10/14/17 0455 10/14/17 0804  NA  --  132*   < > 136 136 137 137  K  --  4.9   < > 3.7 3.4* 3.1* 2.9*  CL  --  99*   < > 104 103 101 102  CO2  --  11*   < > 14* 16* 17* 19*  GLUCOSE  --  113*   < > 152* 131* 121* 105*  BUN  --  126*   < > 106* 107* 104* 105*  CREATININE 7.25* 7.11*   < > 5.92* 5.72* 5.80* 5.73*  CALCIUM  --  5.8*   < > 5.9* 6.5* 6.6* 6.5*  PHOS 6.4* 5.7*  --  2.9  --   --   --    < > = values in this interval not displayed.   Recent Labs  Lab 10/13/17 0041 10/13/17 0913 10/13/17 2000  AST 80*  --   --   ALT 58*  --   --   ALKPHOS 154*  --   --   BILITOT 1.5*  --   --   PROT 6.8  --   --   ALBUMIN 3.6 3.1* 2.6*   Recent Labs  Lab 10/13/17 0041 10/13/17 0511  10/13/17 1318 10/13/17 1749 10/14/17 0455  WBC 3.3* 2.7*  --   --   --  4.9  NEUTROABS 2.6  --   --   --   --   --   HGB 11.4* 11.4*   < > 10.5* 9.6* 9.2*  HCT 33.5* 33.7*   < > 31.3* 29.0* 26.2*  MCV 91.0 90.6  --   --   --  88.2  PLT 94* 77*  --   --   --  80*   < > = values in this interval not displayed.   Iron/TIBC/Ferritin/ %Sat No results found for: IRON, TIBC, FERRITIN, IRONPCTSAT

## 2017-10-14 NOTE — Progress Notes (Signed)
PULMONARY / CRITICAL CARE MEDICINE   Name: Ashley Bradley MRN: [id number] DOB: 04-14-1951    ADMISSION DATE:  10/13/2017 CONSULTATION DATE: 10/13/2017  REFERRING MD: Rancour EDMD  CHIEF COMPLAINT: Altered mental status  HISTORY OF PRESENT ILLNESS:   [age] yoF with PMH of ETOH abuse, depression, HTN, ETOH related seizures, and CP presenting with decreased mental status.  Patient lives with husband at home.  It is difficult to understand patient secondary to extreme dry mouth.  States she has not felt well for 2 weeks. Reports of coffee ground emesis and diarrhea.  Last drink was 4/11.  Denies drinking rubbing alcohol or other ingestants, SI, fever, neck pain or stiffness, itching, SOB, chest pain, headache.   Found to be in alcohol withdrawal and acute renal failure.  PAST MEDICAL HISTORY :  She  has a past medical history of Alcoholism (HCC), Complication of anesthesia, CP (cerebral palsy) (HCC), Depression, Hypertension, PONV (postoperative nausea and vomiting), and Seizures (HCC).  PAST SURGICAL HISTORY: She  has a past surgical history that includes Breast surgery; Tubal ligation; and Bladder surgery.  Allergies  Allergen Reactions  . Macrodantin [Nitrofurantoin] Nausea And Vomiting    No current facility-administered medications on file prior to encounter.    Current Outpatient Medications on File Prior to Encounter  Medication Sig  . aspirin EC 81 MG tablet Take 81 mg by mouth daily.  [name]. FLUoxetine (PROZAC) 20 MG capsule Take 1 capsule (20 mg total) by mouth daily. (Patient taking differently: Take 40 mg by mouth daily. )  . losartan (COZAAR) 50 MG tablet Take 1 tablet (50 mg total) by mouth daily.  [name]. saccharomyces boulardii (FLORASTOR) 250 MG capsule Take 250 mg by mouth 2 (two) times daily.  [name]. ALPRAZolam (XANAX) 0.5 MG tablet Take 1 tablet (0.5 mg total) by mouth 3 (three) times daily as needed for anxiety. (Patient not taking: Reported on 10/13/2017)  . cephALEXin (KEFLEX)  500 MG capsule Take 1 capsule (500 mg total) by mouth 2 (two) times daily. (Patient not taking: Reported on 10/13/2017)  . folic acid (FOLVITE) 1 MG tablet Take 1 tablet (1 mg total) by mouth daily. (Patient not taking: Reported on 10/13/2017)  . Multiple Vitamin (MULTIVITAMIN WITH MINERALS) TABS tablet Take 1 tablet by mouth daily. (Patient not taking: Reported on 10/13/2017)  . phosphorus (K PHOS NEUTRAL) 155-852-130 MG tablet Take 1 tablet (250 mg total) by mouth 2 (two) times daily. (Patient not taking: Reported on 10/13/2017)  . thiamine 100 MG tablet Take 1 tablet (100 mg total) by mouth daily. (Patient not taking: Reported on 10/13/2017)    FAMILY HISTORY:  Her indicated that her mother is deceased. She indicated that her father is deceased.   SOCIAL HISTORY: She  reports that she has never smoked. She has never used smokeless tobacco. She reports that she drinks alcohol. She reports that she does not use drugs.  SUBJECTIVE:  Burgess EstelleYesterday was seen by nephrology and although urine output initially appeared to be improving the patient developed increasing respiratory distress trying to compensate for her significant metabolic acidosis.  Mental status declined and she became hypotensive.  She was intubated and started on mechanical ventilation.  Initially catheter was placed.  Seen by nephrology today who initiated CRRT.  VITAL SIGNS: BP (!) 116/57 (BP Location: Left Arm)   Pulse 84   Temp 98.2 F (36.8 C) (Axillary)   Resp (!) 28   Ht 5\' 5"  (1.651 m)   Wt 177 lb 11.1 oz (80.6 kg)  SpO2 100%   BMI 29.57 kg/m   HEMODYNAMICS: CVP:  [13 mmHg-23 mmHg] 23 mmHg  VENTILATOR SETTINGS: Vent Mode: PRVC FiO2 (%):  [40 %-50 %] 40 % Set Rate:  [28 bmp] 28 bmp Vt Set:  [460 mL] 460 mL PEEP:  [5 cmH20] 5 cmH20 Plateau Pressure:  [19 cmH20-24 cmH20] 22 cmH20  INTAKE / OUTPUT: I/O last 3 completed shifts: In: 10652.4 [I.V.:5597.4; Other:105; NG/GT:410; IV Piggyback:4540] Out: 854 [Urine:415;  Other:437; Stool:2]  PHYSICAL EXAMINATION: General: Ill kempt woman appearing stated age moderate malnutrition.  Appears comfortable on mechanical ventilation Neuro: General tremulousness.  Able to follow commands despite fluid fentanyl infusion.  No focal neurological deficits.  Calm and compliant HEENT: Endotracheal tube in place.  Caked bloody oral secretions. Cardiovascular: In sinus tachycardia.  Extremities warm and well-perfused heart sounds unremarkable.  Mild peripheral edema. Lungs: On PR VC mechanical ventilation with no dyssynchrony.  Chest is clear to auscultation.  With no adventitial sounds. Abdomen: Abdomen is soft and nontender.  Orogastric tube is in place.  No bloody drainage. Musculoskeletal: No active joints.  No joint deformities. Skin: Skin remains intact.  LABS:  BMET Recent Labs  Lab 10/14/17 0804 10/14/17 1238 10/14/17 1630  NA 137 139 139  K 2.9* 3.3* 3.3*  CL 102 106 106  CO2 19* 18* 19*  BUN 105* 105* 104*  CREATININE 5.73* 5.58* 5.58*  GLUCOSE 105* 87 83    Electrolytes Recent Labs  Lab 10/13/17 0913  10/13/17 1749 10/13/17 2000  10/14/17 0455 10/14/17 0804 10/14/17 1238 10/14/17 1630  CALCIUM 5.8*   < > 5.2* 5.9*   < > 6.6* 6.5* 6.4* 6.6*  MG  --   --  1.3* 1.4*  --  1.8  --   --   --   PHOS 5.7*  --   --  2.9  --   --   --   --  >1.0*   < > = values in this interval not displayed.    CBC Recent Labs  Lab 10/13/17 0041 10/13/17 0511  10/13/17 1318 10/13/17 1749 10/14/17 0455  WBC 3.3* 2.7*  --   --   --  4.9  HGB 11.4* 11.4*   < > 10.5* 9.6* 9.2*  HCT 33.5* 33.7*   < > 31.3* 29.0* 26.2*  PLT 94* 77*  --   --   --  80*   < > = values in this interval not displayed.    Coag's Recent Labs  Lab 10/13/17 0041  APTT 34  INR 1.04    Sepsis Markers Recent Labs  Lab 10/13/17 0103 10/13/17 0511  LATICACIDVEN 1.88  --   PROCALCITON  --  2.84    ABG Recent Labs  Lab 10/13/17 0253 10/13/17 0650 10/13/17 1725  PHART  7.196* 7.210* 7.367  PCO2ART 28.1* 28.3* 23.8*  PO2ART 115* 108 335*    Liver Enzymes Recent Labs  Lab 10/13/17 0041 10/13/17 0913 10/13/17 2000 10/14/17 1630  AST 80*  --   --   --   ALT 58*  --   --   --   ALKPHOS 154*  --   --   --   BILITOT 1.5*  --   --   --   ALBUMIN 3.6 3.1* 2.6* 2.2*    Cardiac Enzymes Recent Labs  Lab 10/13/17 0511  TROPONINI 0.15*    Glucose Recent Labs  Lab 10/13/17 2335 10/14/17 0353 10/14/17 0743 10/14/17 1224 10/14/17 1650 10/14/17 2007  GLUCAP 133*  127* 109* 89 87 89    Imaging No results found.   ANTIBIOTICS: Currently on no antibiotics.  SIGNIFICANT EVENTS: CRRT initiated today with goal 50 cc/h fluid removal  LINES/TUBES: Endotracheal tube 7.5 mm, Foley catheter required for urine output assessment.  Right IJ dialysis catheter, site intact.  Peripheral IV site intact.  DISCUSSION: 67 year old woman with significant history of alcohol abuse and alcohol withdrawal.  Significant hypovolemic shock now corrected but with acute kidney injury.  Now there has been partial renal recovery is not been sufficient to normalize either her acid base or volume status.  ASSESSMENT / PLAN:  PULMONARY A: Currently mechanically ventilated P:   Daily SBT.  Will likely require fluid removal before successful extubation.  CARDIOVASCULAR A:  In sinus tachycardia.  Requiring low-dose phenylephrine infusion to tolerate fluid removal with CRRT P:  Continue current vasopressor strategy. Trial of benzodiazepines for tachycardia likely related to alcohol withdrawal.  RENAL A:   Acute kidney injury due to hypovolemia.  Currently on CRRT.  Persistent hypokalemia. P:   Replace electrolytes as indicated.  Continue CRRT as per nephrology.  GASTROINTESTINAL A:   Moderate nutritional risk.  Abdomen is benign.  Stable hemoglobin with no evidence of ongoing GI blood loss.  Evidence of fatty liver disease on abdominal sonogram.  No evidence of  alcoholic hepatitis. P:   We will discontinue octreotide infusion and convert Protonix to oral.  Initiate tube feeds.  HEMATOLOGIC A:   Anemia of chronic disease and alcohol abuse.  Thrombocytopenia due to alcohol related marrow suppression.  P:  No indication for transfusion.  Continue unfractionated heparin for DVT prophylaxis  INFECTIOUS A:   No leukocytosis or signs of infection. P:   To remain off antibiotics.  ENDOCRINE A:   Hypoglycemic indicating poor nutritional reserve. P:   Initiate enteric feeding.  NEUROLOGIC A:   Tremulousness consistent with alcohol withdrawal. P:   RASS goal: 0 Continue narcotic-based sedation strategy with mirtazapine as as needed for alcohol withdrawal.   FAMILY  - Updates: Has been updated at bedside  - Inter-disciplinary family meet or Palliative Care meeting due by: Day 7  Patient is critically ill due to respiratory failure and hypotension requiring mechanical ventilation and titration of vasopressors.  Total critical care time excluding separately billable procedures: 30 minutes.  Pulmonary and Critical Care Medicine [facility] Pager: [phone]  10/14/2017, 8:46 PM

## 2017-10-15 LAB — GLUCOSE, CAPILLARY
GLUCOSE-CAPILLARY: 71 mg/dL (ref 65–99)
GLUCOSE-CAPILLARY: 85 mg/dL (ref 65–99)
GLUCOSE-CAPILLARY: 91 mg/dL (ref 65–99)
Glucose-Capillary: 69 mg/dL (ref 65–99)
Glucose-Capillary: 109 mg/dL — ABNORMAL HIGH (ref 65–99)
Glucose-Capillary: 111 mg/dL — ABNORMAL HIGH (ref 65–99)
Glucose-Capillary: 85 mg/dL (ref 65–99)
Glucose-Capillary: 160 mg/dL — ABNORMAL HIGH (ref 65–99)

## 2017-10-15 LAB — RENAL FUNCTION PANEL
ALBUMIN: 2.4 g/dL — AB (ref 3.5–5.0)
ANION GAP: 8 (ref 5–15)
Albumin: 2.7 g/dL — ABNORMAL LOW (ref 3.5–5.0)
Anion gap: 12 (ref 5–15)
BUN: 58 mg/dL — ABNORMAL HIGH (ref 6–20)
BUN: 46 mg/dL — ABNORMAL HIGH (ref 6–20)
CHLORIDE: 104 mmol/L (ref 101–111)
CHLORIDE: 107 mmol/L (ref 101–111)
CO2: 23 mmol/L (ref 22–32)
CO2: 25 mmol/L (ref 22–32)
Calcium: 7.4 mg/dL — ABNORMAL LOW (ref 8.9–10.3)
Calcium: 7.2 mg/dL — ABNORMAL LOW (ref 8.9–10.3)
Creatinine, Ser: 2.98 mg/dL — ABNORMAL HIGH (ref 0.44–1.00)
Creatinine, Ser: 2.08 mg/dL — ABNORMAL HIGH (ref 0.44–1.00)
GFR calc Af Amer: 27 mL/min — ABNORMAL LOW (ref >60)
GFR, EST AFRICAN AMERICAN: 18 mL/min — AB (ref >60)
GFR, EST NON AFRICAN AMERICAN: 15 mL/min — AB (ref >60)
GFR, EST NON AFRICAN AMERICAN: 23 mL/min — AB (ref >60)
Glucose, Bld: 83 mg/dL (ref 65–99)
Glucose, Bld: 91 mg/dL (ref 65–99)
PHOSPHORUS: 1.8 mg/dL — AB (ref 2.5–4.6)
POTASSIUM: 4.8 mmol/L (ref 3.5–5.1)
Phosphorus: 1.4 mg/dL — ABNORMAL LOW (ref 2.5–4.6)
Potassium: 4.3 mmol/L (ref 3.5–5.1)
Sodium: 139 mmol/L (ref 135–145)
Sodium: 140 mmol/L (ref 135–145)

## 2017-10-15 LAB — POCT I-STAT, CHEM 8
BUN: 26 mg/dL — AB (ref 6–20)
CREATININE: 1.4 mg/dL — AB (ref 0.44–1.00)
Calcium, Ion: 0.49 mmol/L — CL (ref 1.15–1.40)
Chloride: 100 mmol/L — ABNORMAL LOW (ref 101–111)
Glucose, Bld: 152 mg/dL — ABNORMAL HIGH (ref 65–99)
HCT: 29 % — ABNORMAL LOW (ref 36.0–46.0)
Hemoglobin: 9.9 g/dL — ABNORMAL LOW (ref 12.0–15.0)
Potassium: 4.3 mmol/L (ref 3.5–5.1)
Sodium: 140 mmol/L (ref 135–145)
TCO2: 25 mmol/L (ref 22–32)

## 2017-10-15 LAB — APTT: APTT: 31 s (ref 24–36)

## 2017-10-15 LAB — BASIC METABOLIC PANEL
ANION GAP: 12 (ref 5–15)
BUN: 58 mg/dL — AB (ref 6–20)
CALCIUM: 7.4 mg/dL — AB (ref 8.9–10.3)
CO2: 23 mmol/L (ref 22–32)
Chloride: 104 mmol/L (ref 101–111)
Creatinine, Ser: 2.98 mg/dL — ABNORMAL HIGH (ref 0.44–1.00)
GFR calc Af Amer: 18 mL/min — ABNORMAL LOW (ref >60)
GFR, EST NON AFRICAN AMERICAN: 15 mL/min — AB (ref >60)
Glucose, Bld: 81 mg/dL (ref 65–99)
Potassium: 4.4 mmol/L (ref 3.5–5.1)
Sodium: 139 mmol/L (ref 135–145)

## 2017-10-15 LAB — MAGNESIUM: MAGNESIUM: 1.9 mg/dL (ref 1.7–2.4)

## 2017-10-15 MED ORDER — DEXTROSE 5 % IV SOLN
20.0000 g | INTRAVENOUS | Status: DC
  Administered 2017-10-15 – 2017-10-17 (×4): 20 g via INTRAVENOUS_CENTRAL
  Filled 2017-10-15 (×6): qty 200

## 2017-10-15 MED ORDER — FENTANYL CITRATE (PF) 100 MCG/2ML IJ SOLN: 25.0000 ug | INTRAMUSCULAR | Status: DC | PRN

## 2017-10-15 MED ORDER — PRISMASOL B22GK 4/0 22-4 MEQ/L IV SOLN
INTRAVENOUS | Status: DC
  Administered 2017-10-15 – 2017-10-17 (×10): via INTRAVENOUS_CENTRAL
  Filled 2017-10-15 (×16): qty 5000

## 2017-10-15 MED ORDER — DEXTROSE 50 % IV SOLN
INTRAVENOUS | Status: AC
  Administered 2017-10-15: 25 mL
  Filled 2017-10-15: qty 50

## 2017-10-15 MED ORDER — SODIUM GLYCEROPHOSPHATE 1 MMOLE/ML IV SOLN
20.0000 mmol | Freq: Once | INTRAVENOUS | Status: AC
  Administered 2017-10-15: 20 mmol via INTRAVENOUS
  Filled 2017-10-15: qty 20

## 2017-10-15 MED ORDER — DEXTROSE 5 % IV SOLN
Status: DC
  Administered 2017-10-15 – 2017-10-17 (×4): via INTRAVENOUS_CENTRAL
  Filled 2017-10-15 (×5): qty 1500

## 2017-10-15 MED ORDER — DEXTROSE 50 % IV SOLN
INTRAVENOUS | Status: AC
  Filled 2017-10-15: qty 50

## 2017-10-15 NOTE — Progress Notes (Signed)
Extubated pt per dr. Magdalene Mollyrder and protocol. RT suction and Pt's able to follow simple directions, leak test no stridor, pt was able to speak and cough.. Extubated to a 4L [place]. Pt'ss vitals are stable. Rt will continue to monitor

## 2017-10-15 NOTE — Procedures (Signed)
Extubation Procedure Note  Patient Details:   Name: Ashley BowensDarlene S Juul DOB: July 05, 1950 MRN: [id number]   Airway Documentation:  Airway 7.5 mm (Active)  Secured at (cm) 24 cm 10/15/2017 12:00 PM  Measured From Lips 10/15/2017 12:00 PM  Secured Location Left 10/15/2017 12:00 PM  Secured By [employer] Tube Holder 10/15/2017 12:00 PM  Tube Holder Repositioned Yes 10/15/2017 11:40 AM  Cuff Pressure (cm H2O) 25 cm H2O 10/15/2017  8:40 AM  Site Condition Dry 10/15/2017 11:40 AM    Evaluation  O2 sats: stable throughout Complications: No apparent complications Patient did tolerate procedure well. Bilateral Breath Sounds: Rhonchi, Diminished   Yes  Katheren Shamsowell, Damia Bobrowski Farmer 10/15/2017, 12:46 PM

## 2017-10-15 NOTE — Progress Notes (Signed)
PULMONARY / CRITICAL CARE MEDICINE   Name: Ashley Bradley MRN: [id number] DOB: 1950-08-13    ADMISSION DATE:  10/13/2017 CONSULTATION DATE: 10/13/2017  REFERRING MD: Rancour EDMD  CHIEF COMPLAINT: Altered mental status  HISTORY OF PRESENT ILLNESS:   67 yoF with PMH of ETOH abuse, depression, HTN, ETOH related seizures, and CP presenting with decreased mental status.  Patient lives with husband at home.  It is difficult to understand patient secondary to extreme dry mouth.  States she has not felt well for 2 weeks. Reports of coffee ground emesis and diarrhea.  Last drink was 4/11.  Denies drinking rubbing alcohol or other ingestants, SI, fever, neck pain or stiffness, itching, SOB, chest pain, headache.   Found to be in alcohol withdrawal and acute renal failure.  CRRT initiated 10/14/17.  PAST MEDICAL HISTORY :  She  has a past medical history of Alcoholism (HCC), Complication of anesthesia, CP (cerebral palsy) (HCC), Depression, Hypertension, PONV (postoperative nausea and vomiting), and Seizures (HCC).  PAST SURGICAL HISTORY: She  has a past surgical history that includes Breast surgery; Tubal ligation; and Bladder surgery.  Allergies  Allergen Reactions  . Macrodantin [Nitrofurantoin] Nausea And Vomiting    No current facility-administered medications on file prior to encounter.    Current Outpatient Medications on File Prior to Encounter  Medication Sig  . aspirin EC 81 MG tablet Take 81 mg by mouth daily.  [name] FLUoxetine (PROZAC) 20 MG capsule Take 1 capsule (20 mg total) by mouth daily. (Patient taking differently: Take 40 mg by mouth daily. )  . losartan (COZAAR) 50 MG tablet Take 1 tablet (50 mg total) by mouth daily.  [name] saccharomyces boulardii (FLORASTOR) 250 MG capsule Take 250 mg by mouth 2 (two) times daily.  [name] ALPRAZolam (XANAX) 0.5 MG tablet Take 1 tablet (0.5 mg total) by mouth 3 (three) times daily as needed for anxiety. (Patient not taking: Reported on  10/13/2017)  . cephALEXin (KEFLEX) 500 MG capsule Take 1 capsule (500 mg total) by mouth 2 (two) times daily. (Patient not taking: Reported on 10/13/2017)  . folic acid (FOLVITE) 1 MG tablet Take 1 tablet (1 mg total) by mouth daily. (Patient not taking: Reported on 10/13/2017)  . Multiple Vitamin (MULTIVITAMIN WITH MINERALS) TABS tablet Take 1 tablet by mouth daily. (Patient not taking: Reported on 10/13/2017)  . phosphorus (K PHOS NEUTRAL) 155-852-130 MG tablet Take 1 tablet (250 mg total) by mouth 2 (two) times daily. (Patient not taking: Reported on 10/13/2017)  . thiamine 100 MG tablet Take 1 tablet (100 mg total) by mouth daily. (Patient not taking: Reported on 10/13/2017)    FAMILY HISTORY:  Her indicated that her mother is deceased. She indicated that her father is deceased.   SOCIAL HISTORY: She  reports that she has never smoked. She has never used smokeless tobacco. She reports that she drinks alcohol. She reports that she does not use drugs.  SUBJECTIVE:   Has tolerated CRRT.  Currently off fentanyl infusion and is comfortable. Denies discomfort.  VITAL SIGNS: BP (!) 102/55   Pulse 98   Temp 98.9 F (37.2 C) (Oral)   Resp (!) 21   Ht 5\' 5"  (1.651 m)   Wt 180 lb 1.9 oz (81.7 kg)   SpO2 97%   BMI 29.97 kg/m   HEMODYNAMICS: CVP:  [10 mmHg-27 mmHg] 12 mmHg  VENTILATOR SETTINGS: Vent Mode: PSV;CPAP FiO2 (%):  [40 %] 40 % Set Rate:  [28 bmp] 28 bmp Vt Set:  [460 mL]  460 mL PEEP:  [5 cmH20] 5 cmH20 Pressure Support:  [5 cmH20] 5 cmH20 Plateau Pressure:  [19 cmH20-26 cmH20] 26 cmH20  INTAKE / OUTPUT: I/O last 3 completed shifts: In: 5902.9 [P.O.:150; I.V.:4297.9; Other:95; NG/GT:890; IV Piggyback:470] Out: 3202 [Urine:458; Other:2744]  PHYSICAL EXAMINATION: General: Ill kempt woman appearing stated age moderate malnutrition.  Appears comfortable on mechanical ventilation Neuro: General tremulousness.  Able to follow commands despite fluid fentanyl infusion.  No focal  neurological deficits.  Calm and compliant HEENT: Endotracheal tube in place.  Caked bloody oral secretions. Cardiovascular: In sinus tachycardia.  Extremities warm and well-perfused heart sounds unremarkable.  Moderate peripheral edema. Lungs: Tolerated 4h SBT with SBI 61.   Copious secretions with rhonchi. Patient hs effective cough.  Abdomen: Abdomen is soft and nontender.  Orogastric tube is in place.  No bloody drainage.  Musculoskeletal: No active joints.  No joint deformities. Skin: Skin remains intact.  LABS:  BMET Recent Labs  Lab 10/14/17 1238 10/14/17 1630 10/15/17 0509  NA 139 139 139  139  K 3.3* 3.3* 4.3  4.4  CL 106 106 104  104  CO2 18* 19* 23  23  BUN 105* 104* 58*  58*  CREATININE 5.58* 5.58* 2.98*  2.98*  GLUCOSE 87 83 83  81    Electrolytes Recent Labs  Lab 10/13/17 2000  10/14/17 0455  10/14/17 1238 10/14/17 1630 10/15/17 0509  CALCIUM 5.9*   < > 6.6*   < > 6.4* 6.6* 7.4*  7.4*  MG 1.4*  --  1.8  --   --   --  1.9  PHOS 2.9  --   --   --   --  >1.0* 1.4*   < > = values in this interval not displayed.    CBC Recent Labs  Lab 10/13/17 0041 10/13/17 0511  10/13/17 1318 10/13/17 1749 10/14/17 0455  WBC 3.3* 2.7*  --   --   --  4.9  HGB 11.4* 11.4*   < > 10.5* 9.6* 9.2*  HCT 33.5* 33.7*   < > 31.3* 29.0* 26.2*  PLT 94* 77*  --   --   --  80*   < > = values in this interval not displayed.    Coag's Recent Labs  Lab 10/13/17 0041 10/15/17 0509  APTT 34 31  INR 1.04  --     Sepsis Markers Recent Labs  Lab 10/13/17 0103 10/13/17 0511  LATICACIDVEN 1.88  --   PROCALCITON  --  2.84    ABG Recent Labs  Lab 10/13/17 0253 10/13/17 0650 10/13/17 1725  PHART 7.196* 7.210* 7.367  PCO2ART 28.1* 28.3* 23.8*  PO2ART 115* 108 335*    Liver Enzymes Recent Labs  Lab 10/13/17 0041  10/13/17 2000 10/14/17 1630 10/15/17 0509  AST 80*  --   --   --   --   ALT 58*  --   --   --   --   ALKPHOS 154*  --   --   --   --    BILITOT 1.5*  --   --   --   --   ALBUMIN 3.6   < > 2.6* 2.2* 2.7*   < > = values in this interval not displayed.    Cardiac Enzymes Recent Labs  Lab 10/13/17 0511  TROPONINI 0.15*    Glucose Recent Labs  Lab 10/14/17 1650 10/14/17 2007 10/15/17 0020 10/15/17 0337 10/15/17 0832 10/15/17 1154  GLUCAP 87 89 85 71 69 111*  Imaging No results found.   ANTIBIOTICS: Currently on no antibiotics.  SIGNIFICANT EVENTS: CRRT initiated today with goal 50 cc/h fluid removal  LINES/TUBES: Endotracheal tube 7.5 mm, Foley catheter required for urine output assessment.  Right IJ dialysis catheter, site intact.  Peripheral IV site intact.  DISCUSSION: 67 year old woman with significant history of alcohol abuse and alcohol withdrawal.  Significant hypovolemic shock now corrected but with acute kidney injury.  Now there has been partial renal recovery is not been sufficient to normalize either her acid base or volume status.  ASSESSMENT / PLAN:  PULMONARY A: Currently mechanically ventilated, but has passed SBT P:   Ready for extubation.  CARDIOVASCULAR A:  In sinus rhythm.  Requiring low-dose phenylephrine infusion to tolerate fluid removal with CRRT P:  Continue current vasopressor strategy.   RENAL A:   Acute kidney injury due to hypovolemia.  Currently on CRRT.  Maintaining 66ml/h urine output. P:   Replace electrolytes as indicated.  Continue CRRT as per nephrology. Will increase fluid removal rate to -168ml/h net.  GASTROINTESTINAL A:   Moderate nutritional risk.  Abdomen is benign.  Stable hemoglobin with no evidence of ongoing GI blood loss.  Evidence of fatty liver disease on abdominal sonogram.  No evidence of alcoholic hepatitis. P:   Bedside swallowing evaluation post extubation.  HEMATOLOGIC A:   Anemia of chronic disease and alcohol abuse.  Thrombocytopenia due to alcohol related marrow suppression.  P:  No indication for transfusion.  Continue  unfractionated heparin for DVT prophylaxis.  INFECTIOUS A:   No leukocytosis or signs of infection. P:   To remain off antibiotics.  ENDOCRINE A:   Hypoglycemic indicating poor nutritional reserve. P:   Continue to monitor post extubation.[name]  NEUROLOGIC A:   Tremulousness consistent with alcohol withdrawal, has now improved. P:   Reassess BZD requirements post extubation.    FAMILY  - Updates: Has been updated at bedside  - Inter-disciplinary family meet or Palliative Care meeting due by: Ashley 7  Patient is critically ill due to respiratory failure and hypotension requiring mechanical ventilation and titration of vasopressors.  Total critical care time excluding separately billable procedures: 40 minutes.  Pulmonary and Critical Care Medicine [facility] Pager: [phone]  10/15/2017, 11:59 AM

## 2017-10-15 NOTE — Progress Notes (Signed)
[facility] Progress Note  Subjective: extubated this am, is alert , coughing a lot, no dyspnea.  4.4 L in and 5.5 L out since started CRRT yest.    Vitals:   10/15/17 1830 10/15/17 1845 10/15/17 1900 10/15/17 2000  BP: (!) 115/53 (!) 118/56 115/73   Pulse: (!) 109 92 (!) 101   Resp: (!) 27 20 (!) 32   Temp:    98.5 F (36.9 C)  TempSrc:    Oral  SpO2: 95% 96% 95%   Weight:      Height:        Inpatient medications: . chlorhexidine gluconate (MEDLINE KIT)  15 mL Mouth Rinse BID  . Chlorhexidine Gluconate Cloth  6 each Topical Daily  . feeding supplement (PRO-STAT SUGAR FREE 64)  30 mL Per Tube BID  . feeding supplement (VITAL HIGH PROTEIN)  1,000 mL Per Tube Q24H  . folic acid  1 mg Intravenous Daily  . mouth rinse  15 mL Mouth Rinse QID  . multivitamin  15 mL Oral Daily  . pantoprazole sodium  40 mg Per Tube Daily  . potassium chloride  40 mEq Oral BID  . sodium chloride flush  10-40 mL Intracatheter Q12H   . calcium gluconate infusion for CRRT 20 g (10/15/17 2001)  . phenylephrine (NEO-SYNEPHRINE) Adult infusion 30 mcg/min (10/15/17 1624)  . dialysate (PRISMASATE)    . dialysis replacement fluid (prismasate) 200 mL/hr at 10/15/17 1639  . sodium chloride    . sodium citrate 2 %/dextrose 2.5% solution 3000 mL 250 mL/hr at 10/15/17 2002   acetaminophen (TYLENOL) oral liquid 160 mg/5 mL, alteplase, bisacodyl, docusate, fentaNYL (SUBLIMAZE) injection, heparin, sodium chloride, sodium chloride flush  Exam: On the vent, responsive No jvd Chest coarse bs bilat RRR Abd obese ntnd no ascites Ext diffuse 2-3+ edema x 4 ext NF, moves all ext, lethargic  UA 4/12 > mod Hb, 100 prot, 0-5 rbc/ 6-30 wbc UNa 31 ECHO - LV 55-60%, PA peak 59 mm Hg, G1DD CXR 4/12   1 am > no acute disease CXR 4/12   5 pm > new vasc congestion, some IS edema, possible LLL infiltrate      Impression: 1  Renal failure - presumable acute due to ATN from vol depletion/ shock/ ARB/  hypoperfusion. Renal US shows normal sized kidneys, no hydro.  No sig UOP.  UA is unremarkable.  D#2 today of CRRT.  Will repeat CXR in am, f/u edema. Add citrate protocol due to frequent clotting. Cont to pull fluid if not increasing pressors, she is still very much vol up from admission.  2  Shock - prob septic, pressors down but not off 3  ETOH abuse 4  Hypocalcemia - better 5  Hypophosphatemia - will replace IV   Plan - as above   Kelly Splinter MD [facility] pager [phone]   10/15/2017, 8:10 PM   Recent Labs  Lab 10/14/17 1630 10/15/17 0509 10/15/17 1606  NA 139 139  139 140  K 3.3* 4.3  4.4 4.8  CL 106 104  104 107  CO2 19* _0 GLUCOSE 83 83  81 91  BUN 104* 58*  58* 46*  CREATININE 5.58* 2.98*  2.98* 2.08*  CALCIUM 6.6* 7.4*  7.4* 7.2*  PHOS >1.0* 1.4* 1.8*   Recent Labs  Lab 10/13/17 0041  10/14/17 1630 10/15/17 0509 10/15/17 1606  AST 80*  --   --   --   --  ALT 58*  --   --   --   --   ALKPHOS 154*  --   --   --   --   BILITOT 1.5*  --   --   --   --   PROT 6.8  --   --   --   --   ALBUMIN 3.6   < > 2.2* 2.7* 2.4*   < > = values in this interval not displayed.   Recent Labs  Lab 10/13/17 0041 10/13/17 0511  10/13/17 1318 10/13/17 1749 10/14/17 0455  WBC 3.3* 2.7*  --   --   --  4.9  NEUTROABS 2.6  --   --   --   --   --   HGB 11.4* 11.4*   < > 10.5* 9.6* 9.2*  HCT 33.5* 33.7*   < > 31.3* 29.0* 26.2*  MCV 91.0 90.6  --   --   --  88.2  PLT 94* 77*  --   --   --  80*   < > = values in this interval not displayed.   Iron/TIBC/Ferritin/ %Sat No results found for: IRON, TIBC, FERRITIN, IRONPCTSAT

## 2017-10-16 ENCOUNTER — Inpatient Hospital Stay (HOSPITAL_COMMUNITY): Payer: Medicare Other

## 2017-10-16 LAB — POCT I-STAT, CHEM 8
BUN: 22 mg/dL — AB (ref 6–20)
BUN: 21 mg/dL — AB (ref 6–20)
BUN: 19 mg/dL (ref 6–20)
BUN: 22 mg/dL — AB (ref 6–20)
BUN: 18 mg/dL (ref 6–20)
BUN: 19 mg/dL (ref 6–20)
BUN: 16 mg/dL (ref 6–20)
BUN: 17 mg/dL (ref 6–20)
BUN: 15 mg/dL (ref 6–20)
BUN: 14 mg/dL (ref 6–20)
BUN: 17 mg/dL (ref 6–20)
BUN: 17 mg/dL (ref 6–20)
BUN: 20 mg/dL (ref 6–20)
BUN: 24 mg/dL — ABNORMAL HIGH (ref 6–20)
BUN: 25 mg/dL — ABNORMAL HIGH (ref 6–20)
BUN: 30 mg/dL — ABNORMAL HIGH (ref 6–20)
CALCIUM ION: 0.44 mmol/L — AB (ref 1.15–1.40)
CALCIUM ION: 0.5 mmol/L — AB (ref 1.15–1.40)
CALCIUM ION: 0.5 mmol/L — AB (ref 1.15–1.40)
CALCIUM ION: 0.5 mmol/L — AB (ref 1.15–1.40)
CALCIUM ION: 0.51 mmol/L — AB (ref 1.15–1.40)
CALCIUM ION: 0.51 mmol/L — AB (ref 1.15–1.40)
CALCIUM ION: 0.84 mmol/L — AB (ref 1.15–1.40)
CALCIUM ION: 0.9 mmol/L — AB (ref 1.15–1.40)
CALCIUM ION: 0.91 mmol/L — AB (ref 1.15–1.40)
CALCIUM ION: 0.93 mmol/L — AB (ref 1.15–1.40)
CALCIUM ION: 0.96 mmol/L — AB (ref 1.15–1.40)
CALCIUM ION: 1.13 mmol/L — AB (ref 1.15–1.40)
CHLORIDE: 101 mmol/L (ref 101–111)
CHLORIDE: 89 mmol/L — AB (ref 101–111)
CHLORIDE: 91 mmol/L — AB (ref 101–111)
CHLORIDE: 92 mmol/L — AB (ref 101–111)
CHLORIDE: 92 mmol/L — AB (ref 101–111)
CHLORIDE: 94 mmol/L — AB (ref 101–111)
CHLORIDE: 94 mmol/L — AB (ref 101–111)
CREATININE: 1.2 mg/dL — AB (ref 0.44–1.00)
CREATININE: 1.5 mg/dL — AB (ref 0.44–1.00)
CREATININE: 1.2 mg/dL — AB (ref 0.44–1.00)
CREATININE: 1.3 mg/dL — AB (ref 0.44–1.00)
CREATININE: 1.2 mg/dL — AB (ref 0.44–1.00)
CREATININE: 1.2 mg/dL — AB (ref 0.44–1.00)
CREATININE: 1.2 mg/dL — AB (ref 0.44–1.00)
CREATININE: 1.1 mg/dL — AB (ref 0.44–1.00)
CREATININE: 1.1 mg/dL — AB (ref 0.44–1.00)
Calcium, Ion: 0.37 mmol/L — CL (ref 1.15–1.40)
Calcium, Ion: 0.48 mmol/L — CL (ref 1.15–1.40)
Calcium, Ion: 0.56 mmol/L — CL (ref 1.15–1.40)
Calcium, Ion: 1.03 mmol/L — ABNORMAL LOW (ref 1.15–1.40)
Chloride: 100 mmol/L — ABNORMAL LOW (ref 101–111)
Chloride: 84 mmol/L — ABNORMAL LOW (ref 101–111)
Chloride: 85 mmol/L — ABNORMAL LOW (ref 101–111)
Chloride: 86 mmol/L — ABNORMAL LOW (ref 101–111)
Chloride: 89 mmol/L — ABNORMAL LOW (ref 101–111)
Chloride: 89 mmol/L — ABNORMAL LOW (ref 101–111)
Chloride: 90 mmol/L — ABNORMAL LOW (ref 101–111)
Chloride: 97 mmol/L — ABNORMAL LOW (ref 101–111)
Chloride: 98 mmol/L — ABNORMAL LOW (ref 101–111)
Creatinine, Ser: 1.1 mg/dL — ABNORMAL HIGH (ref 0.44–1.00)
Creatinine, Ser: 1.1 mg/dL — ABNORMAL HIGH (ref 0.44–1.00)
Creatinine, Ser: 1.2 mg/dL — ABNORMAL HIGH (ref 0.44–1.00)
Creatinine, Ser: 1.2 mg/dL — ABNORMAL HIGH (ref 0.44–1.00)
Creatinine, Ser: 1.3 mg/dL — ABNORMAL HIGH (ref 0.44–1.00)
Creatinine, Ser: 1.3 mg/dL — ABNORMAL HIGH (ref 0.44–1.00)
Creatinine, Ser: 1.5 mg/dL — ABNORMAL HIGH (ref 0.44–1.00)
GLUCOSE: 154 mg/dL — AB (ref 65–99)
GLUCOSE: 159 mg/dL — AB (ref 65–99)
GLUCOSE: 168 mg/dL — AB (ref 65–99)
GLUCOSE: 192 mg/dL — AB (ref 65–99)
GLUCOSE: 188 mg/dL — AB (ref 65–99)
GLUCOSE: 193 mg/dL — AB (ref 65–99)
GLUCOSE: 170 mg/dL — AB (ref 65–99)
GLUCOSE: 179 mg/dL — AB (ref 65–99)
GLUCOSE: 162 mg/dL — AB (ref 65–99)
GLUCOSE: 180 mg/dL — AB (ref 65–99)
GLUCOSE: 158 mg/dL — AB (ref 65–99)
GLUCOSE: 224 mg/dL — AB (ref 65–99)
Glucose, Bld: 118 mg/dL — ABNORMAL HIGH (ref 65–99)
Glucose, Bld: 135 mg/dL — ABNORMAL HIGH (ref 65–99)
Glucose, Bld: 154 mg/dL — ABNORMAL HIGH (ref 65–99)
Glucose, Bld: 211 mg/dL — ABNORMAL HIGH (ref 65–99)
HCT: 27 % — ABNORMAL LOW (ref 36.0–46.0)
HCT: 28 % — ABNORMAL LOW (ref 36.0–46.0)
HCT: 30 % — ABNORMAL LOW (ref 36.0–46.0)
HCT: 27 % — ABNORMAL LOW (ref 36.0–46.0)
HCT: 29 % — ABNORMAL LOW (ref 36.0–46.0)
HCT: 31 % — ABNORMAL LOW (ref 36.0–46.0)
HCT: 32 % — ABNORMAL LOW (ref 36.0–46.0)
HCT: 40 % (ref 36.0–46.0)
HCT: 30 % — ABNORMAL LOW (ref 36.0–46.0)
HCT: 32 % — ABNORMAL LOW (ref 36.0–46.0)
HCT: 30 % — ABNORMAL LOW (ref 36.0–46.0)
HCT: 32 % — ABNORMAL LOW (ref 36.0–46.0)
HEMATOCRIT: 27 % — AB (ref 36.0–46.0)
HEMATOCRIT: 28 % — AB (ref 36.0–46.0)
HEMATOCRIT: 29 % — AB (ref 36.0–46.0)
HEMATOCRIT: 36 % (ref 36.0–46.0)
HEMOGLOBIN: 10.2 g/dL — AB (ref 12.0–15.0)
HEMOGLOBIN: 10.2 g/dL — AB (ref 12.0–15.0)
HEMOGLOBIN: 10.9 g/dL — AB (ref 12.0–15.0)
HEMOGLOBIN: 10.9 g/dL — AB (ref 12.0–15.0)
HEMOGLOBIN: 12.2 g/dL (ref 12.0–15.0)
HEMOGLOBIN: 9.2 g/dL — AB (ref 12.0–15.0)
HEMOGLOBIN: 9.9 g/dL — AB (ref 12.0–15.0)
Hemoglobin: 10.2 g/dL — ABNORMAL LOW (ref 12.0–15.0)
Hemoglobin: 10.5 g/dL — ABNORMAL LOW (ref 12.0–15.0)
Hemoglobin: 10.9 g/dL — ABNORMAL LOW (ref 12.0–15.0)
Hemoglobin: 13.6 g/dL (ref 12.0–15.0)
Hemoglobin: 9.2 g/dL — ABNORMAL LOW (ref 12.0–15.0)
Hemoglobin: 9.2 g/dL — ABNORMAL LOW (ref 12.0–15.0)
Hemoglobin: 9.5 g/dL — ABNORMAL LOW (ref 12.0–15.0)
Hemoglobin: 9.5 g/dL — ABNORMAL LOW (ref 12.0–15.0)
Hemoglobin: 9.9 g/dL — ABNORMAL LOW (ref 12.0–15.0)
POTASSIUM: 3.9 mmol/L (ref 3.5–5.1)
POTASSIUM: 4.1 mmol/L (ref 3.5–5.1)
POTASSIUM: 4.4 mmol/L (ref 3.5–5.1)
POTASSIUM: 4.4 mmol/L (ref 3.5–5.1)
Potassium: 3.4 mmol/L — ABNORMAL LOW (ref 3.5–5.1)
Potassium: 3.6 mmol/L (ref 3.5–5.1)
Potassium: 3.6 mmol/L (ref 3.5–5.1)
Potassium: 3.6 mmol/L (ref 3.5–5.1)
Potassium: 3.8 mmol/L (ref 3.5–5.1)
Potassium: 3.9 mmol/L (ref 3.5–5.1)
Potassium: 3.9 mmol/L (ref 3.5–5.1)
Potassium: 3.9 mmol/L (ref 3.5–5.1)
Potassium: 4 mmol/L (ref 3.5–5.1)
Potassium: 4.1 mmol/L (ref 3.5–5.1)
Potassium: 4.4 mmol/L (ref 3.5–5.1)
Potassium: 4.6 mmol/L (ref 3.5–5.1)
SODIUM: 138 mmol/L (ref 135–145)
SODIUM: 139 mmol/L (ref 135–145)
SODIUM: 139 mmol/L (ref 135–145)
SODIUM: 141 mmol/L (ref 135–145)
SODIUM: 141 mmol/L (ref 135–145)
Sodium: 138 mmol/L (ref 135–145)
Sodium: 141 mmol/L (ref 135–145)
Sodium: 138 mmol/L (ref 135–145)
Sodium: 140 mmol/L (ref 135–145)
Sodium: 139 mmol/L (ref 135–145)
Sodium: 137 mmol/L (ref 135–145)
Sodium: 139 mmol/L (ref 135–145)
Sodium: 139 mmol/L (ref 135–145)
Sodium: 139 mmol/L (ref 135–145)
Sodium: 138 mmol/L (ref 135–145)
Sodium: 140 mmol/L (ref 135–145)
TCO2: 27 mmol/L (ref 22–32)
TCO2: 30 mmol/L (ref 22–32)
TCO2: 30 mmol/L (ref 22–32)
TCO2: 32 mmol/L (ref 22–32)
TCO2: 32 mmol/L (ref 22–32)
TCO2: 36 mmol/L — AB (ref 22–32)
TCO2: 34 mmol/L — AB (ref 22–32)
TCO2: 34 mmol/L — AB (ref 22–32)
TCO2: 26 mmol/L (ref 22–32)
TCO2: 28 mmol/L (ref 22–32)
TCO2: 28 mmol/L (ref 22–32)
TCO2: 28 mmol/L (ref 22–32)
TCO2: 30 mmol/L (ref 22–32)
TCO2: 30 mmol/L (ref 22–32)
TCO2: 32 mmol/L (ref 22–32)
TCO2: 40 mmol/L — ABNORMAL HIGH (ref 22–32)

## 2017-10-16 LAB — RENAL FUNCTION PANEL
ANION GAP: 15 (ref 5–15)
Albumin: 2.4 g/dL — ABNORMAL LOW (ref 3.5–5.0)
Albumin: 2.4 g/dL — ABNORMAL LOW (ref 3.5–5.0)
Anion gap: 11 (ref 5–15)
BUN: 28 mg/dL — ABNORMAL HIGH (ref 6–20)
BUN: 19 mg/dL (ref 6–20)
CHLORIDE: 92 mmol/L — AB (ref 101–111)
CO2: 27 mmol/L (ref 22–32)
CO2: 33 mmol/L — ABNORMAL HIGH (ref 22–32)
CREATININE: 1.4 mg/dL — AB (ref 0.44–1.00)
Calcium: 8.3 mg/dL — ABNORMAL LOW (ref 8.9–10.3)
Calcium: 9.2 mg/dL (ref 8.9–10.3)
Chloride: 101 mmol/L (ref 101–111)
Creatinine, Ser: 1.55 mg/dL — ABNORMAL HIGH (ref 0.44–1.00)
GFR calc Af Amer: 39 mL/min — ABNORMAL LOW
GFR calc non Af Amer: 33 mL/min — ABNORMAL LOW
GFR, EST AFRICAN AMERICAN: 44 mL/min — AB (ref >60)
GFR, EST NON AFRICAN AMERICAN: 38 mL/min — AB (ref >60)
Glucose, Bld: 145 mg/dL — ABNORMAL HIGH (ref 65–99)
Glucose, Bld: 140 mg/dL — ABNORMAL HIGH (ref 65–99)
POTASSIUM: 3.9 mmol/L (ref 3.5–5.1)
Phosphorus: <1 mg/dL — CL (ref 2.5–4.6)
Phosphorus: 2 mg/dL — ABNORMAL LOW (ref 2.5–4.6)
Potassium: 3.1 mmol/L — ABNORMAL LOW (ref 3.5–5.1)
Sodium: 139 mmol/L (ref 135–145)
Sodium: 140 mmol/L (ref 135–145)

## 2017-10-16 LAB — CBC WITH DIFFERENTIAL/PLATELET
BASOS ABS: 0 10*3/uL (ref 0.0–0.1)
Basophils Relative: 0 %
EOS ABS: 0.1 10*3/uL (ref 0.0–0.7)
Eosinophils Relative: 1 %
HCT: 25.8 % — ABNORMAL LOW (ref 36.0–46.0)
Hemoglobin: 8.9 g/dL — ABNORMAL LOW (ref 12.0–15.0)
LYMPHS ABS: 1.9 10*3/uL (ref 0.7–4.0)
LYMPHS PCT: 17 %
MCH: 31.3 pg (ref 26.0–34.0)
MCHC: 34.5 g/dL (ref 30.0–36.0)
MCV: 90.8 fL (ref 78.0–100.0)
MONOS PCT: 25 %
Monocytes Absolute: 2.8 10*3/uL — ABNORMAL HIGH (ref 0.1–1.0)
Neutro Abs: 6.3 10*3/uL (ref 1.7–7.7)
Neutrophils Relative %: 57 %
PLATELETS: 122 10*3/uL — AB (ref 150–400)
RBC: 2.84 MIL/uL — AB (ref 3.87–5.11)
RDW: 18.6 % — ABNORMAL HIGH (ref 11.5–15.5)
WBC: 11.1 10*3/uL — AB (ref 4.0–10.5)

## 2017-10-16 LAB — MAGNESIUM: Magnesium: 1.8 mg/dL (ref 1.7–2.4)

## 2017-10-16 LAB — GLUCOSE, CAPILLARY
GLUCOSE-CAPILLARY: 147 mg/dL — AB (ref 65–99)
GLUCOSE-CAPILLARY: 135 mg/dL — AB (ref 65–99)

## 2017-10-16 LAB — APTT: APTT: 30 s (ref 24–36)

## 2017-10-16 MED ORDER — FOLIC ACID 1 MG PO TABS
1.0000 mg | ORAL_TABLET | Freq: Every day | ORAL | Status: DC
  Administered 2017-10-16 – 2017-10-26 (×9): 1 mg via ORAL
  Filled 2017-10-16 (×9): qty 1

## 2017-10-16 MED ORDER — LIP MEDEX EX OINT
TOPICAL_OINTMENT | CUTANEOUS | Status: AC
  Administered 2017-10-16: 07:00:00
  Filled 2017-10-16: qty 7

## 2017-10-16 MED ORDER — PANTOPRAZOLE SODIUM 40 MG PO TBEC
40.0000 mg | DELAYED_RELEASE_TABLET | Freq: Every day | ORAL | Status: DC
  Administered 2017-10-16 – 2017-10-26 (×8): 40 mg via ORAL
  Filled 2017-10-16 (×8): qty 1

## 2017-10-16 MED ORDER — LORAZEPAM 2 MG/ML IJ SOLN
2.0000 mg | Freq: Four times a day (QID) | INTRAMUSCULAR | Status: DC | PRN
  Administered 2017-10-16 – 2017-10-20 (×3): 2 mg via INTRAVENOUS
  Filled 2017-10-16 (×4): qty 1

## 2017-10-16 MED ORDER — DOCUSATE SODIUM 100 MG PO CAPS: 100.0000 mg | ORAL_CAPSULE | Freq: Two times a day (BID) | ORAL | Status: DC | PRN

## 2017-10-16 MED ORDER — SODIUM GLYCEROPHOSPHATE 1 MMOLE/ML IV SOLN: 20.0000 mmol | Freq: Once | INTRAVENOUS | Status: DC

## 2017-10-16 MED ORDER — SODIUM PHOSPHATES 45 MMOLE/15ML IV SOLN
20.0000 mmol | Freq: Once | INTRAVENOUS | Status: AC
  Administered 2017-10-16: 20 mmol via INTRAVENOUS
  Filled 2017-10-16: qty 6.67

## 2017-10-16 MED ORDER — ORAL CARE MOUTH RINSE
15.0000 mL | Freq: Two times a day (BID) | OROMUCOSAL | Status: DC
  Administered 2017-10-16 – 2017-10-26 (×14): 15 mL via OROMUCOSAL

## 2017-10-16 MED ORDER — POTASSIUM CHLORIDE CRYS ER 20 MEQ PO TBCR
40.0000 meq | EXTENDED_RELEASE_TABLET | Freq: Two times a day (BID) | ORAL | Status: AC
  Administered 2017-10-16 (×2): 40 meq via ORAL
  Filled 2017-10-16 (×2): qty 2

## 2017-10-16 MED ORDER — ADULT MULTIVITAMIN W/MINERALS CH
1.0000 | ORAL_TABLET | Freq: Every day | ORAL | Status: DC
  Administered 2017-10-16 – 2017-10-26 (×8): 1 via ORAL
  Filled 2017-10-16 (×8): qty 1

## 2017-10-16 NOTE — Progress Notes (Signed)
CRITICAL VALUE ALERT  Critical Value:  Phosphorus < 1  Date & Time Notied:  10/16/2017 0545   Provider Notified: E-Link  Orders Received/Actions taken: Awaiting further orders.

## 2017-10-16 NOTE — Progress Notes (Signed)
PULMONARY / CRITICAL CARE MEDICINE   Name: Ashley Bradley MRN: [id number] DOB: [birth year]    ADMISSION DATE:  10/13/2017 CONSULTATION DATE: 10/13/2017  REFERRING MD: Rancour EDMD  CHIEF COMPLAINT: Altered mental status  HISTORY OF PRESENT ILLNESS:   67 yoF with PMH of ETOH abuse, depression, HTN, ETOH related seizures, and CP presenting with decreased mental status.  Patient lives with husband at home.  It is difficult to understand patient secondary to extreme dry mouth.  States she has not felt well for 2 weeks. Reports of coffee ground emesis and diarrhea.  Last drink was 4/11.  Denies drinking rubbing alcohol or other ingestants, SI, fever, neck pain or stiffness, itching, SOB, chest pain, headache.   Found to be in alcohol withdrawal and acute renal failure.  CRRT initiated 10/14/17.  Extubated on 10/15/17.  PAST MEDICAL HISTORY :  She  has a past medical history of Alcoholism (HCC), Complication of anesthesia, CP (cerebral palsy) (HCC), Depression, Hypertension, PONV (postoperative nausea and vomiting), and Seizures (HCC).  PAST SURGICAL HISTORY: She  has a past surgical history that includes Breast surgery; Tubal ligation; and Bladder surgery.  Allergies  Allergen Reactions  . Macrodantin [Nitrofurantoin] Nausea And Vomiting    No current facility-administered medications on file prior to encounter.    Current Outpatient Medications on File Prior to Encounter  Medication Sig  . aspirin EC 81 MG tablet Take 81 mg by mouth daily.  [name] FLUoxetine (PROZAC) 20 MG capsule Take 1 capsule (20 mg total) by mouth daily. (Patient taking differently: Take 40 mg by mouth daily. )  . losartan (COZAAR) 50 MG tablet Take 1 tablet (50 mg total) by mouth daily.  [name] saccharomyces boulardii (FLORASTOR) 250 MG capsule Take 250 mg by mouth 2 (two) times daily.  [name] ALPRAZolam (XANAX) 0.5 MG tablet Take 1 tablet (0.5 mg total) by mouth 3 (three) times daily as needed for anxiety. (Patient not  taking: Reported on 10/13/2017)  . cephALEXin (KEFLEX) 500 MG capsule Take 1 capsule (500 mg total) by mouth 2 (two) times daily. (Patient not taking: Reported on 10/13/2017)  . folic acid (FOLVITE) 1 MG tablet Take 1 tablet (1 mg total) by mouth daily. (Patient not taking: Reported on 10/13/2017)  . Multiple Vitamin (MULTIVITAMIN WITH MINERALS) TABS tablet Take 1 tablet by mouth daily. (Patient not taking: Reported on 10/13/2017)  . phosphorus (K PHOS NEUTRAL) 155-852-130 MG tablet Take 1 tablet (250 mg total) by mouth 2 (two) times daily. (Patient not taking: Reported on 10/13/2017)  . thiamine 100 MG tablet Take 1 tablet (100 mg total) by mouth daily. (Patient not taking: Reported on 10/13/2017)    FAMILY HISTORY:  Her indicated that her mother is deceased. She indicated that her father is deceased.   SOCIAL HISTORY: She  reports that she has never smoked. She has never used smokeless tobacco. She reports that she drinks alcohol. She reports that she does not use drugs.  SUBJECTIVE:   Has tolerated CRRT.  Currently off fentanyl infusion and is comfortable. Denies discomfort. States that she feels much better.  VITAL SIGNS: BP (!) 106/50   Pulse 91   Temp 98.1 F (36.7 C) (Oral)   Resp (!) 27   Ht 5\' 5"  (1.651 m)   Wt 173 lb 1 oz (78.5 kg)   SpO2 97%   BMI 28.80 kg/m   HEMODYNAMICS: CVP:  [8 mmHg-20 mmHg] 8 mmHg  VENTILATOR SETTINGS:  no nasal cannulae  INTAKE / OUTPUT: I/O last 3 completed  shifts: In: 4766.9 [P.O.:620; I.V.:2914.6; NG/GT:830; IV Piggyback:402.3] Out: 7577 [Urine:423; ZOXWR:6045Other:7144; Stool:10]  PHYSICAL EXAMINATION: General: Ill kempt woman appearing stated age moderate malnutrition.  Appears comfortable on mechanical ventilation Neuro: General tremulousness has resolved..  Able to follow commands and speech is fluent. HEENT:.  Caked bloody oral secretions. Cardiovascular: In sinus tachycardia.  Extremities warm and well-perfused heart sounds unremarkable.   Moderate peripheral edema. Lungs: Clear to auscultation Abdomen: Abdomen is soft and nontender.  Musculoskeletal: No active joints.  No joint deformities. Skin: Skin remains intact.  LABS:  BMET Recent Labs  Lab 10/15/17 0509 10/15/17 1606 10/15/17 2204 10/16/17 0328  NA 139  139 140 140 139  K 4.3  4.4 4.8 4.3 3.1*  CL 104  104 107 100* 101  CO2 23  23 25   --  27  BUN 58*  58* 46* 26* 28*  CREATININE 2.98*  2.98* 2.08* 1.40* 1.55*  GLUCOSE 83  81 91 152* 145*    Electrolytes Recent Labs  Lab 10/14/17 0455  10/15/17 0509 10/15/17 1606 10/16/17 0328  CALCIUM 6.6*   < > 7.4*  7.4* 7.2* 8.3*  MG 1.8  --  1.9  --  1.8  PHOS  --    < > 1.4* 1.8* <1.0*   < > = values in this interval not displayed.    CBC Recent Labs  Lab 10/13/17 0511  10/14/17 0455 10/15/17 2204 10/16/17 0328  WBC 2.7*  --  4.9  --  11.1*  HGB 11.4*   < > 9.2* 9.9* 8.9*  HCT 33.7*   < > 26.2* 29.0* 25.8*  PLT 77*  --  80*  --  122*   < > = values in this interval not displayed.    Coag's Recent Labs  Lab 10/13/17 0041 10/15/17 0509 10/16/17 0328  APTT 34 31 30  INR 1.04  --   --     Sepsis Markers Recent Labs  Lab 10/13/17 0103 10/13/17 0511  LATICACIDVEN 1.88  --   PROCALCITON  --  2.84    ABG Recent Labs  Lab 10/13/17 0253 10/13/17 0650 10/13/17 1725  PHART 7.196* 7.210* 7.367  PCO2ART 28.1* 28.3* 23.8*  PO2ART 115* 108 335*    Liver Enzymes Recent Labs  Lab 10/13/17 0041  10/15/17 0509 10/15/17 1606 10/16/17 0328  AST 80*  --   --   --   --   ALT 58*  --   --   --   --   ALKPHOS 154*  --   --   --   --   BILITOT 1.5*  --   --   --   --   ALBUMIN 3.6   < > 2.7* 2.4* 2.4*   < > = values in this interval not displayed.    Cardiac Enzymes Recent Labs  Lab 10/13/17 0511  TROPONINI 0.15*    Glucose Recent Labs  Lab 10/15/17 1224 10/15/17 1603 10/15/17 1941 10/15/17 2325 10/16/17 0322 10/16/17 0739  GLUCAP 109* 85 91 160* 147* 135*     Imaging Dg Chest Port 1 View  Result Date: 10/16/2017 CLINICAL DATA:  Acute kidney injury. EXAM: PORTABLE CHEST 1 VIEW COMPARISON:  Radiograph of October 13, 2017. FINDINGS: Stable cardiomediastinal silhouette. Endotracheal and nasogastric tubes have been removed. No pneumothorax is noted. Right internal jugular dialysis catheter is noted. Stable left lower lobe opacity is noted concerning for atelectasis or infiltrate. Mild right midlung opacity is noted concerning for atelectasis or infiltrate, with slightly  increased right infrahilar opacity. No significant pleural effusion is noted. Bony thorax is unremarkable. IMPRESSION: Endotracheal and nasogastric tubes have been removed. Bilateral lung opacities are noted concerning for atelectasis or infiltrate. Electronically Signed   By: Lupita Raider, M.D.   On: 10/16/2017 07:05     ANTIBIOTICS: Currently on no antibiotics.  SIGNIFICANT EVENTS: CRRT initiated today with goal 50 cc/h fluid removal  LINES/TUBES: Endotracheal tube 7.5 mm, Foley catheter required for urine output assessment.  Right IJ dialysis catheter, site intact.  Peripheral IV site intact.  DISCUSSION: 67 year old woman with significant history of alcohol abuse and alcohol withdrawal.  Significant hypovolemic shock now corrected but with acute kidney injury.  Now there has been partial renal recovery is not been sufficient to normalize either her acid base or volume status.  ASSESSMENT / PLAN:  PULMONARY A: Respiratory failure resolved.Tolerated extubation. P:   Early mobility.  CARDIOVASCULAR A:  In sinus rhythm.  Requiring low-dose phenylephrine infusion to tolerate fluid removal with CRRT P:  Weaned off vasopressors.[name]   RENAL A:   Acute kidney injury due to hypovolemia.  Currently on CRRT.  Maintaining 4ml/h urine output. P:   Replace electrolytes as indicated.  Continue CRRT as per nephrology. Will increase fluid removal rate to -162ml/h  net. -Discontinue tomorrow or if circuit clots off.  GASTROINTESTINAL A:   Moderate nutritional risk.  Abdomen is benign.  Stable hemoglobin with no evidence of ongoing GI blood loss.  Evidence of fatty liver disease on abdominal sonogram.  No evidence of alcoholic hepatitis. P:   Progressive diet.  HEMATOLOGIC A:   Anemia of chronic disease and alcohol abuse.  Thrombocytopenia due to alcohol related marrow suppression.  P:  No indication for transfusion.  Continue unfractionated heparin for DVT prophylaxis.  INFECTIOUS A:   No leukocytosis or signs of infection. P:   To remain off antibiotics.  ENDOCRINE A:   Hypoglycemic indicating poor nutritional reserve. P:   Continue to monitor post extubation.[name]  NEUROLOGIC A:   Alcohol withdrawal has resolved. P:   CIWA discontinued.    FAMILY  - Updates: Has been updated at bedside  - Inter-disciplinary family meet or Palliative Care meeting due by: Day 7    Pulmonary and Critical Care Medicine [facility] Pager: [phone]  10/16/2017, 3:43 PM

## 2017-10-16 NOTE — Progress Notes (Signed)
eLink Physician Progress Note and Electrolyte Replacement  Patient Name: Ashley Bradley DOB: 05-Nov-1950 MRN: [id number]  Date of Service  10/16/2017   HPI/Events of Note   Recent Labs  Lab 10/13/17 1749 10/13/17 2000  10/14/17 0455  10/14/17 1238 10/14/17 1630 10/15/17 0509 10/15/17 1606 10/15/17 2204 10/16/17 0328  NA 135 136   < > 137   < > 139 139 139  139 140 140 139  K 4.4 3.7   < > 3.1*   < > 3.3* 3.3* 4.3  4.4 4.8 4.3 4.5  CL 103 104   < > 101   < > 106 106 104  104 107 100* 101  CO2 14* 14*   < > 17*   < > 18* 19* 23  23 25   --  27  GLUCOSE 162* 152*   < > 121*   < > 87 83 83  81 91 152* 145*  BUN 105* 106*   < > 104*   < > 105* 104* 58*  58* 46* 26* 28*  CREATININE 5.94* 5.92*   < > 5.80*   < > 5.58* 5.58* 2.98*  2.98* 2.08* 1.40* 1.55*  CALCIUM 5.2* 5.9*   < > 6.6*   < > 6.4* 6.6* 7.4*  7.4* 7.2*  --  8.3*  MG 1.3* 1.4*  --  1.8  --   --   --  1.9  --   --  1.8  PHOS  --  2.9  --   --   --   --  >1.0* 1.4* 1.8*  --  <1.0*   < > = values in this interval not displayed.    Estimated Creatinine Clearance: 36 mL/min (A) (by C-G formula based on SCr of 1.55 mg/dL (H)).  Intake/Output      04/14 0701 - 04/15 0700   P.O. 470   I.V. (mL/kg) 1634.8 (20.8)   NG/GT 350   IV Piggyback 238   Total Intake(mL/kg) 2692.8 (34.3)   Urine (mL/kg/hr) 248 (0.1)   Other 4067   Stool 10   Total Output 4325   Net -1632.2        - I/O DETAILED x 24h    Total I/O In: 1209 [P.O.:305; I.V.:904] Out: 1833 [Urine:91; Other:1742] - I/O THIS SHIFT    ASSESSMENT -low phos  eICURN Interventions  Na phos 20 mmol   ASSESSMENT: MAJOR ELECTROLYTE      Dr. Kalman ShanMurali Brylinn Teaney, M.D., [facility] SacramentoF.C.C.P Pulmonary and Critical Care Medicine Staff Physician [facility] Pulmonary and Critical Care Pager: [phone], If no answer or between  15:00h - 7:00h: call [phone]  10/16/2017 5:48 AM

## 2017-10-16 NOTE — Progress Notes (Signed)
Nutrition Brief Note  Consult received on 4/13 afternoon for TF initiation and management while pt was intubated. She was extubated yesterday afternoon and consult d/c'ed by MD. Pt does not screen for any other nutrition-related screenings. Patient identified as meeting criteria for severe malnutrition in the context of social/environmental circumstances on 06/30/16. This was based on intakes and weight loss. NFPE shows no muscle or fat wasting at this time, stable weight, appetite now improved. At this time, unable to state malnutrition based on ASPEN guidelines. Her mouth has been dry recently. She requested Ensure from RN this AM.   Wt Readings from Last 15 Encounters:  10/16/17 173 lb 1 oz (78.5 kg)  06/28/16 140 lb (63.5 kg)  01/26/16 159 lb 3.2 oz (72.2 kg)  01/28/16 159 lb (72.1 kg)  01/22/16 163 lb 2.3 oz (74 kg)    Body mass index is 28.8 kg/m. Patient meets criteria for overweight based on current BMI. Skin WDL. Pt with hx of alcohol abuse, depression, HTN, alcohol withdrawal-related seizures, and CP. Per notes, her last drink was on 4/11. She was started on CRRT on 4/13 d/t volume overload. Admission weight of 159 lb 2.8 oz indicating current weight is +14 lb in the past 3 days.   Current diet order is FLD.  Medications reviewed; 1 mg oral folic acid/day, daily multivitamin with minerals, 40 mg oral Protonix/day, 40 mEq oral KCl BID, 20 mmol IV Glycophos x1 run today, 20 mmol IV NaPhos x1 run today.  Labs reviewed; CBGs: 147 and 135 mg/dL this AM, BUN: 28 mg/dL, creatinine: 4.781.55 mg/dL, Ca: 8.3 mg/dL, Phos: <1 mg/dL, GFR: 39 mL/min.   No nutrition interventions warranted at this time. If nutrition issues arise, please consult RD.      Trenton GammonJessica Kutter Schnepf, MS, RD, LDN, [facility] Inpatient Clinical Dietitian Pager # [phone] After hours/weekend pager # [phone]

## 2017-10-16 NOTE — Progress Notes (Signed)
[facility] Progress Note  Subjective: UOP improving, about 20 cc/hr  Vitals:   10/16/17 0800 10/16/17 1000 10/16/17 1100 10/16/17 1200  BP: (!) 112/55 (!) 112/53 110/67   Pulse: 94 87 84   Resp: (!) 21 (!) 23 (!) 24   Temp: 98.3 F (36.8 C)   98.1 F (36.7 C)  TempSrc: Oral   Oral  SpO2: 97% 97% 97%   Weight:      Height:        Inpatient medications: . Chlorhexidine Gluconate Cloth  6 each Topical Daily  . folic acid  1 mg Oral Daily  . mouth rinse  15 mL Mouth Rinse BID  . multivitamin with minerals  1 tablet Oral Daily  . pantoprazole  40 mg Oral Daily  . potassium chloride  40 mEq Oral BID  . sodium chloride flush  10-40 mL Intracatheter Q12H   . calcium gluconate infusion for CRRT 100 mL/hr at 10/16/17 1300  . phenylephrine (NEO-SYNEPHRINE) Adult infusion Stopped (10/16/17 1135)  . dialysate (PRISMASATE) 1,400 mL/hr at 10/16/17 0904  . dialysis replacement fluid (prismasate) 200 mL/hr at 10/15/17 1639  . sodium chloride    . sodium citrate 2 %/dextrose 2.5% solution 3000 mL 250 mL/hr at 10/16/17 0751   acetaminophen (TYLENOL) oral liquid 160 mg/5 mL, alteplase, bisacodyl, docusate sodium, fentaNYL (SUBLIMAZE) injection, heparin, sodium chloride, sodium chloride flush  Exam: Alert, no distress,  No jvd Chest coarse bs bilat RRR Abd obese ntnd no ascites Ext improving 2+ edema NF, moves all ext, alert  UA 4/12 > mod Hb, 100 prot, 0-5 rbc/ 6-30 wbc UNa 31 ECHO - LV 55-60%, PA peak 59 mm Hg, G1DD CXR 4/12   1 am > no acute disease CXR 4/12   5 pm > vasc congestion CXR 4/15   vasc congestion, no edema      Impression: 1  Renal failure - presumable acute due to ATN from vol depletion/ shock/ ARB/ hypoperfusion. Renal US shows normal sized kidneys, no hydro.  Making urine now.  UA is unremarkable.  D#3 today of CRRT, cont to remove vol as tolerated today, if system clots will dc CRRT, otherwise can prob dc in am. Added citrate for local  anticoagulation. Lowered dialysate flow. 2  Shock , septic - off of pressors now 3  ETOH abuse 4  Hypocalcemia - better  5  Hypophosphatemia - will replace IV again today   Plan - as above   Vinson Moselleob Elza Varricchio MD [facility] pager [phone]   10/16/2017, 1:24 PM   Recent Labs  Lab 10/15/17 0509 10/15/17 1606 10/15/17 2204 10/16/17 0328  NA 139  139 140 140 139  K 4.3  4.4 4.8 4.3 4.5  CL 104  104 107 100* 101  CO2 23  23 25   --  27  GLUCOSE 83  81 91 152* 145*  BUN 58*  58* 46* 26* 28*  CREATININE 2.98*  2.98* 2.08* 1.40* 1.55*  CALCIUM 7.4*  7.4* 7.2*  --  8.3*  PHOS 1.4* 1.8*  --  <1.0*   Recent Labs  Lab 10/13/17 0041  10/15/17 0509 10/15/17 1606 10/16/17 0328  AST 80*  --   --   --   --   ALT 58*  --   --   --   --   ALKPHOS 154*  --   --   --   --   BILITOT 1.5*  --   --   --   --  PROT 6.8  --   --   --   --   ALBUMIN 3.6   < > 2.7* 2.4* 2.4*   < > = values in this interval not displayed.   Recent Labs  Lab 10/13/17 0041 10/13/17 0511  10/14/17 0455 10/15/17 2204 10/16/17 0328  WBC 3.3* 2.7*  --  4.9  --  11.1*  NEUTROABS 2.6  --   --   --   --  6.3  HGB 11.4* 11.4*   < > 9.2* 9.9* 8.9*  HCT 33.5* 33.7*   < > 26.2* 29.0* 25.8*  MCV 91.0 90.6  --  88.2  --  90.8  PLT 94* 77*  --  80*  --  122*   < > = values in this interval not displayed.   Iron/TIBC/Ferritin/ %Sat No results found for: IRON, TIBC, FERRITIN, IRONPCTSAT

## 2017-10-17 ENCOUNTER — Other Ambulatory Visit: Payer: Self-pay

## 2017-10-17 ENCOUNTER — Encounter (HOSPITAL_COMMUNITY): Payer: Self-pay

## 2017-10-17 LAB — MAGNESIUM: MAGNESIUM: 1.6 mg/dL — AB (ref 1.7–2.4)

## 2017-10-17 LAB — CBC WITH DIFFERENTIAL/PLATELET
BASOS ABS: 0 10*3/uL (ref 0.0–0.1)
BASOS PCT: 0 %
EOS ABS: 0.1 10*3/uL (ref 0.0–0.7)
EOS PCT: 0 %
HCT: 28.1 % — ABNORMAL LOW (ref 36.0–46.0)
Hemoglobin: 9.5 g/dL — ABNORMAL LOW (ref 12.0–15.0)
Lymphocytes Relative: 17 %
Lymphs Abs: 2.2 10*3/uL (ref 0.7–4.0)
MCH: 30.8 pg (ref 26.0–34.0)
MCHC: 33.8 g/dL (ref 30.0–36.0)
MCV: 91.2 fL (ref 78.0–100.0)
MONO ABS: 2.6 10*3/uL — AB (ref 0.1–1.0)
Monocytes Relative: 20 %
Neutro Abs: 8 10*3/uL — ABNORMAL HIGH (ref 1.7–7.7)
Neutrophils Relative %: 63 %
PLATELETS: 158 10*3/uL (ref 150–400)
RBC: 3.08 MIL/uL — ABNORMAL LOW (ref 3.87–5.11)
RDW: 18.5 % — AB (ref 11.5–15.5)
WBC: 12.9 10*3/uL — ABNORMAL HIGH (ref 4.0–10.5)

## 2017-10-17 LAB — RENAL FUNCTION PANEL
ANION GAP: 15 (ref 5–15)
Albumin: 2.5 g/dL — ABNORMAL LOW (ref 3.5–5.0)
BUN: 15 mg/dL (ref 6–20)
CALCIUM: 11.6 mg/dL — AB (ref 8.9–10.3)
CO2: 41 mmol/L — ABNORMAL HIGH (ref 22–32)
Chloride: 86 mmol/L — ABNORMAL LOW (ref 101–111)
Creatinine, Ser: 1.48 mg/dL — ABNORMAL HIGH (ref 0.44–1.00)
GFR, EST AFRICAN AMERICAN: 41 mL/min — AB (ref >60)
GFR, EST NON AFRICAN AMERICAN: 35 mL/min — AB (ref >60)
Glucose, Bld: 135 mg/dL — ABNORMAL HIGH (ref 65–99)
PHOSPHORUS: 2.3 mg/dL — AB (ref 2.5–4.6)
Potassium: 3.9 mmol/L (ref 3.5–5.1)
SODIUM: 142 mmol/L (ref 135–145)

## 2017-10-17 LAB — POCT I-STAT, CHEM 8
BUN: 20 mg/dL (ref 6–20)
BUN: 22 mg/dL — ABNORMAL HIGH (ref 6–20)
BUN: 13 mg/dL (ref 6–20)
BUN: 15 mg/dL (ref 6–20)
BUN: 12 mg/dL (ref 6–20)
CALCIUM ION: 0.6 mmol/L — AB (ref 1.15–1.40)
CALCIUM ION: 0.41 mmol/L — AB (ref 1.15–1.40)
CALCIUM ION: 1.12 mmol/L — AB (ref 1.15–1.40)
CALCIUM ION: 0.43 mmol/L — AB (ref 1.15–1.40)
CHLORIDE: 81 mmol/L — AB (ref 101–111)
CREATININE: 1.3 mg/dL — AB (ref 0.44–1.00)
Calcium, Ion: 0.77 mmol/L — CL (ref 1.15–1.40)
Chloride: 85 mmol/L — ABNORMAL LOW (ref 101–111)
Chloride: 95 mmol/L — ABNORMAL LOW (ref 101–111)
Chloride: 82 mmol/L — ABNORMAL LOW (ref 101–111)
Chloride: 80 mmol/L — ABNORMAL LOW (ref 101–111)
Creatinine, Ser: 1.2 mg/dL — ABNORMAL HIGH (ref 0.44–1.00)
Creatinine, Ser: 1.1 mg/dL — ABNORMAL HIGH (ref 0.44–1.00)
Creatinine, Ser: 1.3 mg/dL — ABNORMAL HIGH (ref 0.44–1.00)
Creatinine, Ser: 1.1 mg/dL — ABNORMAL HIGH (ref 0.44–1.00)
GLUCOSE: 476 mg/dL — AB (ref 65–99)
GLUCOSE: 215 mg/dL — AB (ref 65–99)
Glucose, Bld: 167 mg/dL — ABNORMAL HIGH (ref 65–99)
Glucose, Bld: 133 mg/dL — ABNORMAL HIGH (ref 65–99)
Glucose, Bld: 218 mg/dL — ABNORMAL HIGH (ref 65–99)
HCT: 30 % — ABNORMAL LOW (ref 36.0–46.0)
HCT: 35 % — ABNORMAL LOW (ref 36.0–46.0)
HEMATOCRIT: 26 % — AB (ref 36.0–46.0)
HEMATOCRIT: 32 % — AB (ref 36.0–46.0)
HEMATOCRIT: 33 % — AB (ref 36.0–46.0)
HEMOGLOBIN: 10.2 g/dL — AB (ref 12.0–15.0)
HEMOGLOBIN: 8.8 g/dL — AB (ref 12.0–15.0)
HEMOGLOBIN: 10.9 g/dL — AB (ref 12.0–15.0)
HEMOGLOBIN: 11.2 g/dL — AB (ref 12.0–15.0)
HEMOGLOBIN: 11.9 g/dL — AB (ref 12.0–15.0)
Potassium: 3.8 mmol/L (ref 3.5–5.1)
Potassium: 4.3 mmol/L (ref 3.5–5.1)
Potassium: 3.8 mmol/L (ref 3.5–5.1)
Potassium: 3.8 mmol/L (ref 3.5–5.1)
Potassium: 4 mmol/L (ref 3.5–5.1)
SODIUM: 139 mmol/L (ref 135–145)
SODIUM: 140 mmol/L (ref 135–145)
SODIUM: 139 mmol/L (ref 135–145)
Sodium: 134 mmol/L — ABNORMAL LOW (ref 135–145)
Sodium: 140 mmol/L (ref 135–145)
TCO2: 28 mmol/L (ref 22–32)
TCO2: 30 mmol/L (ref 22–32)
TCO2: 41 mmol/L — AB (ref 22–32)
TCO2: 44 mmol/L — AB (ref 22–32)
TCO2: 42 mmol/L — AB (ref 22–32)

## 2017-10-17 LAB — APTT: APTT: 35 s (ref 24–36)

## 2017-10-17 LAB — CALCIUM, IONIZED: CALCIUM, IONIZED, SERUM: 4.4 mg/dL — AB (ref 4.5–5.6)

## 2017-10-17 MED ORDER — PHENOL 1.4 % MT LIQD
1.0000 | OROMUCOSAL | Status: DC | PRN
  Filled 2017-10-17: qty 177

## 2017-10-17 MED ORDER — PROCHLORPERAZINE EDISYLATE 5 MG/ML IJ SOLN
10.0000 mg | Freq: Four times a day (QID) | INTRAMUSCULAR | Status: DC | PRN
  Administered 2017-10-19 – 2017-10-24 (×2): 10 mg via INTRAVENOUS
  Filled 2017-10-17 (×4): qty 2

## 2017-10-17 NOTE — Progress Notes (Signed)
RN has attempted to reiterate pt to the importance of keeping nasal cannula in her nose.  Pt continues to remove [place], without  (3-4L) pt desats to mid-80's.  RN will attempt simple mask to deliver oxygen.  Will continue to monitor.

## 2017-10-17 NOTE — Progress Notes (Signed)
[facility] Progress Note  Subjective: UOP 600 cc yesterday, 390 so far today.  Net - 3.8 L yesterday.  Pt w/o new c/o/   Vitals:   10/17/17 1415 10/17/17 1430 10/17/17 1500 10/17/17 1600  BP: (!) 93/38 (!) 97/47 104/63 (!) 117/55  Pulse:      Resp: (!) 32 16 20 (!) 21  Temp:      TempSrc:      SpO2:      Weight:      Height:        Inpatient medications: . Chlorhexidine Gluconate Cloth  6 each Topical Daily  . folic acid  1 mg Oral Daily  . mouth rinse  15 mL Mouth Rinse BID  . multivitamin with minerals  1 tablet Oral Daily  . pantoprazole  40 mg Oral Daily  . sodium chloride flush  10-40 mL Intracatheter Q12H    acetaminophen (TYLENOL) oral liquid 160 mg/5 mL, bisacodyl, docusate sodium, LORazepam, phenol, prochlorperazine, sodium chloride flush  Exam: Alert, no distress,  No jvd Chest coarse bs bilat RRR Abd obese ntnd no ascites Ext improving 2+ edema NF, moves all ext, alert  UA 4/12 > mod Hb, 100 prot, 0-5 rbc/ 6-30 wbc UNa 31 ECHO - LV 55-60%, PA peak 59 mm Hg, G1DD CXR 4/12   1 am > no acute disease CXR 4/12   5 pm > vasc congestion CXR 4/15   vasc congestion, no edema      Impression: 1  Renal failure - suspected AKI due to ATN from vol depletion/ shock/ ARB/ hypoperfusion. Renal US shows normal sized kidneys, no hydro.  UA is unremarkable.  UOP improving, stopped CRRT this morning.  Vol overload has improved somewhat. No indication for dialysis at this time, will follow labs in am and if needs regular HD will transfer to [facility].  2  Shock , septic - resolved 3  ETOH abuse 4  Hypocalcemia - now hyperCa++ due to citrate anticoagulation. Will resolve on its own off of CRRT.  5  Hypophosphatemia - resolved   Plan - will follow   Vinson Moselleob Taleeya Blondin MD [facility] pager [phone]   10/17/2017, 4:36 PM   Recent Labs  Lab 10/16/17 0328  10/16/17 1608  10/17/17 0352 10/17/17 0403 10/17/17 0410 10/17/17 0623  NA 139   < >  139  140   < > 142 140 139 139  K 3.1*   < > 3.6  3.9   < > 3.9 3.8 3.8 4.0  CL 101   < > 89*  92*   < > 86* 81* 82* 80*  CO2 27  --  33*  --  41*  --   --   --   GLUCOSE 145*   < > 180*  140*   < > 135* 215* 133* 218*  BUN 28*   < > 16  19   < > 15 13 15 12   CREATININE 1.55*   < > 1.10*  1.40*   < > 1.48* 1.10* 1.30* 1.10*  CALCIUM 8.3*  --  9.2  --  11.6*  --   --   --   PHOS <1.0*  --  2.0*  --  2.3*  --   --   --    < > = values in this interval not displayed.   Recent Labs  Lab 10/13/17 0041  10/16/17 0328 10/16/17 1608 10/17/17 0352  AST 80*  --   --   --   --  ALT 58*  --   --   --   --   ALKPHOS 154*  --   --   --   --   BILITOT 1.5*  --   --   --   --   PROT 6.8  --   --   --   --   ALBUMIN 3.6   < > 2.4* 2.4* 2.5*   < > = values in this interval not displayed.   Recent Labs  Lab 10/13/17 0041  10/14/17 0455  10/16/17 0328  10/17/17 0352 10/17/17 0403 10/17/17 0410 10/17/17 0623  WBC 3.3*   < > 4.9  --  11.1*  --  12.9*  --   --   --   NEUTROABS 2.6  --   --   --  6.3  --  8.0*  --   --   --   HGB 11.4*   < > 9.2*   < > 8.9*   < > 9.5* 11.2* 10.9* 11.9*  HCT 33.5*   < > 26.2*   < > 25.8*   < > 28.1* 33.0* 32.0* 35.0*  MCV 91.0   < > 88.2  --  90.8  --  91.2  --   --   --   PLT 94*   < > 80*  --  122*  --  158  --   --   --    < > = values in this interval not displayed.   Iron/TIBC/Ferritin/ %Sat No results found for: IRON, TIBC, FERRITIN, IRONPCTSAT

## 2017-10-17 NOTE — Progress Notes (Addendum)
PULMONARY / CRITICAL CARE MEDICINE   Name: Ashley Bradley MRN: [id number] DOB: 1951-04-08    ADMISSION DATE:  10/13/2017 CONSULTATION DATE: 10/13/2017  REFERRING MD: Rancour EDMD  CHIEF COMPLAINT: Altered mental status  BRIEF SUMMARY: 56 yoF with PMH of ETOH abuse, depression, HTN, ETOH related seizures, and CP presenting with decreased mental status.  Concern for GIB with coffee ground emesis x1.  Found to be in ETOH withdrawal and AKI.  No further emesis / signs of GI loss. She required intubation for airway protection.  CRRT initiated 4/13 for AKI.  Extubated 4/14.  CRRT stopped 4/16.    Patient lives with husband at home.  She described herself as a Horticulturist, commercial drinker".  Last drink 4/11.  Reports she had been on a two wee binge.    SUBJECTIVE:  Pt reports she feels better overall.  Remains hoarse / sore throat.  Off CRRT after filter clotted.  Neo turned off.  Pt reports she feels she is withdrawing from ETOH / feels jittery / nervous.    VITAL SIGNS: BP (!) 95/39   Pulse (!) 116   Temp 99.4 F (37.4 C) (Oral)   Resp (!) 27   Ht 5\' 5"  (1.651 m)   Wt 173 lb 1 oz (78.5 kg)   SpO2 (!) 89%   BMI 28.80 kg/m   HEMODYNAMICS: CVP:  [0 mmHg-8 mmHg] 8 mmHg  VENTILATOR SETTINGS:      INTAKE / OUTPUT: I/O last 3 completed shifts: In: 5539.8 [P.O.:1305; I.V.:4132.5; Other:88; IV Piggyback:14.3] Out: 40981 [Urine:761; Other:9790]  PHYSICAL EXAMINATION: General: chronically ill appearing female / disheveled  HEENT: MM pink/moist, no jvd PSY: calm/appropriate Neuro: AAOx4, speech clear, MAE CV: s1s2 rrr, no m/r/g PULM: even/non-labored, lungs bilaterally clear  XB:JYNW, non-tender, bsx4 active  Extremities: warm/dry, no edema  Skin: no rashes or lesions  LABS:  BMET Recent Labs  Lab 10/16/17 0328  10/16/17 1608  10/17/17 0352 10/17/17 0403 10/17/17 0410 10/17/17 0623  NA 139   < > 139  140   < > 142 140 139 139  K 3.1*   < > 3.6  3.9   < > 3.9 3.8 3.8 4.0  CL  101   < > 89*  92*   < > 86* 81* 82* 80*  CO2 27  --  33*  --  41*  --   --   --   BUN 28*   < > 16  19   < > 15 13 15 12   CREATININE 1.55*   < > 1.10*  1.40*   < > 1.48* 1.10* 1.30* 1.10*  GLUCOSE 145*   < > 180*  140*   < > 135* 215* 133* 218*   < > = values in this interval not displayed.    Electrolytes Recent Labs  Lab 10/15/17 0509  10/16/17 0328 10/16/17 1608 10/17/17 0352  CALCIUM 7.4*  7.4*   < > 8.3* 9.2 11.6*  MG 1.9  --  1.8  --  1.6*  PHOS 1.4*   < > <1.0* 2.0* 2.3*   < > = values in this interval not displayed.    CBC Recent Labs  Lab 10/14/17 0455  10/16/17 0328  10/17/17 0352 10/17/17 0403 10/17/17 0410 10/17/17 0623  WBC 4.9  --  11.1*  --  12.9*  --   --   --   HGB 9.2*   < > 8.9*   < > 9.5* 11.2* 10.9* 11.9*  HCT 26.2*   < >  25.8*   < > 28.1* 33.0* 32.0* 35.0*  PLT 80*  --  122*  --  158  --   --   --    < > = values in this interval not displayed.    Coag's Recent Labs  Lab 10/13/17 0041 10/15/17 0509 10/16/17 0328 10/17/17 0352  APTT 34 31 30 35  INR 1.04  --   --   --     Sepsis Markers Recent Labs  Lab 10/13/17 0103 10/13/17 0511  LATICACIDVEN 1.88  --   PROCALCITON  --  2.84    ABG Recent Labs  Lab 10/13/17 0253 10/13/17 0650 10/13/17 1725  PHART 7.196* 7.210* 7.367  PCO2ART 28.1* 28.3* 23.8*  PO2ART 115* 108 335*    Liver Enzymes Recent Labs  Lab 10/13/17 0041  10/16/17 0328 10/16/17 1608 10/17/17 0352  AST 80*  --   --   --   --   ALT 58*  --   --   --   --   ALKPHOS 154*  --   --   --   --   BILITOT 1.5*  --   --   --   --   ALBUMIN 3.6   < > 2.4* 2.4* 2.5*   < > = values in this interval not displayed.    Cardiac Enzymes Recent Labs  Lab 10/13/17 0511  TROPONINI 0.15*    Glucose Recent Labs  Lab 10/15/17 1224 10/15/17 1603 10/15/17 1941 10/15/17 2325 10/16/17 0322 10/16/17 0739  GLUCAP 109* 85 91 160* 147* 135*    Imaging No results found.   ANTIBIOTICS:   SIGNIFICANT  EVENTS: 4/12  Admitted, AMS, coffee ground emesis, required intubation for airway protection  4/13  CRRT initiated  4/14  Extubated  4/16  CRRT stopped, off neo   LINES/TUBES: ETT 4/12 >> 4/14  R IJ HD Cath 4/13 >>   DISCUSSION: 67 y/o F admitted 4/12 with AMS & coffee ground emesis in the setting of ETOH withdrawal. No further emesis since admit.  Required intubation due to AMS / poor airway protection.  Found to have hypovolemic shock with AKI.  Required CVVHD + pressors for volume removal.  Extubated 4/14.  CRRT stopped 4/16 / off neo.    ASSESSMENT / PLAN:  PULMONARY A: Acute Hypoxic Respiratory Failure - resolved Sore Throat / Hoarseness - from intubation  P:   Pulmonary hygiene - IS, mobilize  Chloraseptic spray  CARDIOVASCULAR A:  Hypovolemic Shock - resolved, required neo briefly to tolerate fluid removal w/ HD P:  SDU monitoring   RENAL A:   Acute kidney Injury - due to hypovolemia.  Required CRRT, stopped 4/16.  P:   Nephrology following, appreciate input Trend BMP / urinary output Replace electrolytes as indicated Avoid nephrotoxic agents, ensure adequate renal perfusion  GASTROINTESTINAL A:   Coffee Ground Emesis (vs dry mouth with multiple cuts on lips/bleeding on admit)- noted on admit, none since, Hgb stable.  Evidence of fatty liver disease on abdominal sonogram.  No evidence of alcoholic hepatitis.  Nausea P:   Diet as tolerated PRN compazine for nausea  HEMATOLOGIC A:   Anemia of chronic disease and alcohol abuse.  Thrombocytopenia due to alcohol related marrow suppression.  P:  Trend CBC  Transfuse per ICU guidelines  Heparin for DVT prophylaxis   INFECTIOUS A:   No leukocytosis or signs of infection. P:   Monitor WBC trend / fever curve   ENDOCRINE A:   Hypoglycemia  P:   Monitor glucose on BMP   NEUROLOGIC A:   ETOH Withdrawal - resolved  P:   PRN ativan for anxiety  / withdrawal symptoms    FAMILY  - Updates: Patient  updated on plan of care.    - Global:  [facility], to SDU status  Canary BrimBrandi Nayleen Janosik, NP-C [facility] Pgr: [phone] or if no answer [phone] 10/17/2017, 11:39 AM

## 2017-10-17 NOTE — Progress Notes (Signed)
CRRT filter clotted; verified with Nephrology, [place], that CRRT system should be stopped.  System stopped and HD catheter Heparin locked and labeled.  Will continue to monitor.

## 2017-10-18 ENCOUNTER — Inpatient Hospital Stay (HOSPITAL_COMMUNITY): Payer: Medicare Other

## 2017-10-18 DIAGNOSIS — R627 Adult failure to thrive: Secondary | ICD-10-CM

## 2017-10-18 DIAGNOSIS — E876 Hypokalemia: Secondary | ICD-10-CM

## 2017-10-18 DIAGNOSIS — F10129 Alcohol abuse with intoxication, unspecified: Secondary | ICD-10-CM

## 2017-10-18 LAB — COMPREHENSIVE METABOLIC PANEL
ALBUMIN: 2.3 g/dL — AB (ref 3.5–5.0)
ALK PHOS: 168 U/L — AB (ref 38–126)
ALT: 36 U/L (ref 14–54)
ANION GAP: 15 (ref 5–15)
AST: 42 U/L — ABNORMAL HIGH (ref 15–41)
BUN: 34 mg/dL — ABNORMAL HIGH (ref 6–20)
CALCIUM: 9.6 mg/dL (ref 8.9–10.3)
CO2: 40 mmol/L — AB (ref 22–32)
Chloride: 84 mmol/L — ABNORMAL LOW (ref 101–111)
Creatinine, Ser: 2.4 mg/dL — ABNORMAL HIGH (ref 0.44–1.00)
GFR calc Af Amer: 23 mL/min — ABNORMAL LOW (ref >60)
GFR calc non Af Amer: 20 mL/min — ABNORMAL LOW (ref >60)
GLUCOSE: 87 mg/dL (ref 65–99)
Potassium: 3.5 mmol/L (ref 3.5–5.1)
SODIUM: 139 mmol/L (ref 135–145)
Total Bilirubin: 1.1 mg/dL (ref 0.3–1.2)
Total Protein: 5.7 g/dL — ABNORMAL LOW (ref 6.5–8.1)

## 2017-10-18 LAB — URINALYSIS, ROUTINE W REFLEX MICROSCOPIC
BILIRUBIN URINE: NEGATIVE
Glucose, UA: NEGATIVE mg/dL
KETONES UR: NEGATIVE mg/dL
Nitrite: NEGATIVE
PROTEIN: 30 mg/dL — AB
Specific Gravity, Urine: 1.009 (ref 1.005–1.030)
pH: 9 — ABNORMAL HIGH (ref 5.0–8.0)

## 2017-10-18 LAB — CBC WITH DIFFERENTIAL/PLATELET
BASOS ABS: 0 10*3/uL (ref 0.0–0.1)
Basophils Relative: 0 %
EOS ABS: 0 10*3/uL (ref 0.0–0.7)
Eosinophils Relative: 0 %
HCT: 24.6 % — ABNORMAL LOW (ref 36.0–46.0)
HEMOGLOBIN: 8.2 g/dL — AB (ref 12.0–15.0)
LYMPHS PCT: 9 %
Lymphs Abs: 1.4 10*3/uL (ref 0.7–4.0)
MCH: 30.5 pg (ref 26.0–34.0)
MCHC: 33.3 g/dL (ref 30.0–36.0)
MCV: 91.4 fL (ref 78.0–100.0)
Monocytes Absolute: 2.2 10*3/uL — ABNORMAL HIGH (ref 0.1–1.0)
Monocytes Relative: 14 %
NEUTROS ABS: 11.8 10*3/uL — AB (ref 1.7–7.7)
NEUTROS PCT: 77 %
Platelets: 229 10*3/uL (ref 150–400)
RBC: 2.69 MIL/uL — ABNORMAL LOW (ref 3.87–5.11)
RDW: 17.6 % — ABNORMAL HIGH (ref 11.5–15.5)
WBC: 15.4 10*3/uL — AB (ref 4.0–10.5)

## 2017-10-18 LAB — PROCALCITONIN: PROCALCITONIN: 0.83 ng/mL

## 2017-10-18 LAB — MAGNESIUM: MAGNESIUM: 1.3 mg/dL — AB (ref 1.7–2.4)

## 2017-10-18 LAB — CULTURE, BLOOD (ROUTINE X 2)
Culture: NO GROWTH
Culture: NO GROWTH
SPECIAL REQUESTS: ADEQUATE
SPECIAL REQUESTS: ADEQUATE

## 2017-10-18 LAB — PHOSPHORUS: Phosphorus: 3.2 mg/dL (ref 2.5–4.6)

## 2017-10-18 LAB — LACTIC ACID, PLASMA: LACTIC ACID, VENOUS: 0.7 mmol/L (ref 0.5–1.9)

## 2017-10-18 LAB — CALCIUM, IONIZED: Calcium, Ionized, Serum: 5.4 mg/dL (ref 4.5–5.6)

## 2017-10-18 MED ORDER — POTASSIUM CHLORIDE CRYS ER 20 MEQ PO TBCR
40.0000 meq | EXTENDED_RELEASE_TABLET | Freq: Once | ORAL | Status: AC
  Administered 2017-10-18: 40 meq via ORAL
  Filled 2017-10-18: qty 2

## 2017-10-18 MED ORDER — IPRATROPIUM-ALBUTEROL 0.5-2.5 (3) MG/3ML IN SOLN
3.0000 mL | Freq: Two times a day (BID) | RESPIRATORY_TRACT | Status: AC
  Administered 2017-10-18 – 2017-10-20 (×3): 3 mL via RESPIRATORY_TRACT
  Filled 2017-10-18 (×2): qty 3

## 2017-10-18 MED ORDER — MAGNESIUM SULFATE IN D5W 1-5 GM/100ML-% IV SOLN
1.0000 g | Freq: Once | INTRAVENOUS | Status: AC
  Administered 2017-10-18: 1 g via INTRAVENOUS
  Filled 2017-10-18: qty 100

## 2017-10-18 MED ORDER — GUAIFENESIN ER 600 MG PO TB12
600.0000 mg | ORAL_TABLET | Freq: Two times a day (BID) | ORAL | Status: DC
  Administered 2017-10-19 – 2017-10-26 (×14): 600 mg via ORAL
  Filled 2017-10-18 (×14): qty 1

## 2017-10-18 MED ORDER — SODIUM CHLORIDE 0.9 % IV SOLN
INTRAVENOUS | Status: AC
  Administered 2017-10-18: 22:00:00 via INTRAVENOUS

## 2017-10-18 NOTE — Progress Notes (Signed)
[facility] Progress Note  Subjective: UOP 1.3 L today so far, pt stable no new c/o's.   Vitals:   10/18/17 1000 10/18/17 1100 10/18/17 1200 10/18/17 1300  BP: (!) 118/54 (!) 117/58 118/65 (!) 122/51  Pulse: (!) 103 (!) 105 (!) 107 (!) 110  Resp: (!) 33 (!) 30 (!) 24 (!) 35  Temp:      TempSrc:      SpO2: 95% 96% 91% 95%  Weight:      Height:        Inpatient medications: . Chlorhexidine Gluconate Cloth  6 each Topical Daily  . folic acid  1 mg Oral Daily  . mouth rinse  15 mL Mouth Rinse BID  . multivitamin with minerals  1 tablet Oral Daily  . pantoprazole  40 mg Oral Daily  . potassium chloride  40 mEq Oral Once  . sodium chloride flush  10-40 mL Intracatheter Q12H   . magnesium sulfate 1 - 4 g bolus IVPB     acetaminophen (TYLENOL) oral liquid 160 mg/5 mL, bisacodyl, docusate sodium, LORazepam, phenol, prochlorperazine, sodium chloride flush  Exam: Alert, no distress,  No jvd Chest coarse bs bilat RRR Abd obese ntnd no ascites Ext improving 2+ edema NF, moves all ext, alert  UA 4/12 > mod Hb, 100 prot, 0-5 rbc/ 6-30 wbc UNa 31 ECHO - LV 55-60%, PA peak 59 mm Hg, G1DD CXR 4/12   1 am > no acute disease CXR 4/12   5 pm > vasc congestion CXR 4/15   vasc congestion, no edema      Impression: 1  Renal failure - suspected AKI due to ATN from vol depletion/ shock/ ARB. Renal US normal sized kidneys, no hydro.  UA unremarkable.  UOP better.  SP CRRT dc'd on 4/16. Vol overload improving.  Creat up slightly to 2.4 today.  No need for RRT at this time.  Will follow.  2  Shock , septic - resolved 3  ETOH abuse 4  Hypercalcemia - resolved 5  Hypophosphatemia - resolved   Plan - will follow   Ashley Moselle MD [facility] pager [phone]   10/18/2017, 3:11 PM   Recent Labs  Lab 10/16/17 1608  10/17/17 0352  10/17/17 0410 10/17/17 0623 10/18/17 0857  NA 139  140   < > 142   < > 139 139 139  K 3.6  3.9   < > 3.9   < > 3.8 4.0  3.5  CL 89*  92*   < > 86*   < > 82* 80* 84*  CO2 33*  --  41*  --   --   --  40*  GLUCOSE 180*  140*   < > 135*   < > 133* 218* 87  BUN 16  19   < > 15   < > 15 12 34*  CREATININE 1.10*  1.40*   < > 1.48*   < > 1.30* 1.10* 2.40*  CALCIUM 9.2  --  11.6*  --   --   --  9.6  PHOS 2.0*  --  2.3*  --   --   --  3.2   < > = values in this interval not displayed.   Recent Labs  Lab 10/13/17 0041  10/16/17 1608 10/17/17 0352 10/18/17 0857  AST 80*  --   --   --  42*  ALT 58*  --   --   --  36  ALKPHOS 154*  --   --   --  168*  BILITOT 1.5*  --   --   --  1.1  PROT 6.8  --   --   --  5.7*  ALBUMIN 3.6   < > 2.4* 2.5* 2.3*   < > = values in this interval not displayed.   Recent Labs  Lab 10/16/17 0328  10/17/17 0352  10/17/17 0410 10/17/17 0623 10/18/17 0857  WBC 11.1*  --  12.9*  --   --   --  15.4*  NEUTROABS 6.3  --  8.0*  --   --   --  11.8*  HGB 8.9*   < > 9.5*   < > 10.9* 11.9* 8.2*  HCT 25.8*   < > 28.1*   < > 32.0* 35.0* 24.6*  MCV 90.8  --  91.2  --   --   --  91.4  PLT 122*  --  158  --   --   --  229   < > = values in this interval not displayed.   Iron/TIBC/Ferritin/ %Sat No results found for: IRON, TIBC, FERRITIN, IRONPCTSAT

## 2017-10-18 NOTE — Progress Notes (Signed)
PROGRESS NOTE  Ashley Bradley:[id number] DOB: 01-04-1951 DOA: 10/13/2017 PCP: Sigmund Hazel, MD   Brief summary:   Pt from home called by family on 4/12 for decrease mental status and tremors Pt drinks about 1/5 ETOH per day for the last 2 weeks but only drank 1/2 that amount on 4/12  She is admitted to icu, Required intubation due to AMS / poor airway protection.  Found to have hypovolemic shock with AKI, significantly elevated bun on presentation.  Required CVVHD + pressors for volume removal.  Extubated 4/14.    Concern for GIB with coffee ground emesis x1 on presentation.   She remain profoundly weak, bun/cr improved, but got worse again on 4/17    HPI/Recap of past 24 hours:  Hoarse,  weak nonproductive cough, denies chest pain, she is on 3liter o2 aaox3 In bilateral heal protector She c/o leaking urine from the foley, urine is clouding in foley, she denies pain, no fever No vomiting, no hematemesis  She reports generalized edema has much improved    Assessment/Plan: Active Problems:   AKI (acute kidney injury) (HCC)   High anion gap metabolic acidosis   Acute metabolic encephalopathy   Acute metabolic encephalopathy: required intubation. Now extubated, aaox3.  AKI on CKD III: Bun124/cr7.55 on presentation Required CVVHD + pressors for volume removal.  CRRT stopped 4/16 / off neo.   Bun 12/cr 1.1 on 4/16, however, bun 34/cr 2.4 on 4/17, urine output 1.2liter documented, though she reports she has been leaking urine  Post ATN diuresis? Will check ua, imaging to rule out obstruction/stone. Renal dosing meds, repeat lab in am   Elevated lft tbili 1.5, ast 80, alt 58 on presentation, likely from alcohol intoxication, hypotension.  lft improving, tbili 1.1, ast 42, alt 36 on 4/17 Ct ab/pel from 2017 showed fatty liver Repeat ab imaging now showed "Marked hepatic steatosis." Check hepatitis panel  Hypokalemia/hypomagnesemia: replace k/mag Repeat in  am  Hypocalcemia: Ct ab with signs of pancreatitis, she currently denies pain Monitor calcium  Normocytic anemia: Report hematemesisx1 on presentation No vomiting, hgb down trending, monitor hgb, continue  Ppi, consider gi consult   Alcohol abuse: Need social worker consult, consider naltraxone at discharge.  Cough: ct chest concerns for pneumonia, she has no fever, will check procalcitonin, start mucinex, flutter valve, nebs,  Aspiration precaution  FTT, will need to start  PT eval if continue to be improving in the next 24hrs.     Code Status: full  Family Communication: patient   Disposition Plan: remain in stepdown today, may be transfer to med tele on 4/18   Consultants:  Critical care  nephrology  Procedures:  CRRT  Intubation/extubation  IJ placement  Antibiotics:  none   Objective: BP (!) 115/55   Pulse (!) 102   Temp 98.1 F (36.7 C) (Oral)   Resp (!) 31   Ht 5\' 5"  (1.651 m)   Wt 78.5 kg (173 lb 1 oz)   SpO2 99%   BMI 28.80 kg/m   Intake/Output Summary (Last 24 hours) at 10/18/2017 0734 Last data filed at 10/18/2017 0600 Gross per 24 hour  Intake 1084.22 ml  Output 1290 ml  Net -205.78 ml   Filed Weights   10/14/17 0200 10/15/17 0500 10/16/17 0400  Weight: 80.6 kg (177 lb 11.1 oz) 81.7 kg (180 lb 1.9 oz) 78.5 kg (173 lb 1 oz)    Exam: Patient is examined daily including today on 10/18/2017, exams remain the same as of yesterday except  that has changed    General:  Weak, frail, NAD  Cardiovascular: RRR  Respiratory: diminished at basis, no rhonchi, no wheezing  Abdomen: Soft/ND/NT, positive BS  Musculoskeletal: No Edema  Neuro: alert, oriented   Data Reviewed: Basic Metabolic Panel: Recent Labs  Lab 10/13/17 2000  10/14/17 0455  10/15/17 0509 10/15/17 1606  10/16/17 0328  10/16/17 1608  10/16/17 2004 10/17/17 0352 10/17/17 0403 10/17/17 0410 10/17/17 0623  NA 136   < > 137   < > 139  139 140   < > 139   < > 139   140   < > 138 142 140 139 139  K 3.7   < > 3.1*   < > 4.3  4.4 4.8   < > 3.1*   < > 3.6  3.9   < > 3.6 3.9 3.8 3.8 4.0  CL 104   < > 101   < > 104  104 107   < > 101   < > 89*  92*   < > 85* 86* 81* 82* 80*  CO2 14*   < > 17*   < > 23  23 25   --  27  --  33*  --   --  41*  --   --   --   GLUCOSE 152*   < > 121*   < > 83  81 91   < > 145*   < > 180*  140*   < > 158* 135* 215* 133* 218*  BUN 106*   < > 104*   < > 58*  58* 46*   < > 28*   < > 16  19   < > 17 15 13 15 12   CREATININE 5.92*   < > 5.80*   < > 2.98*  2.98* 2.08*   < > 1.55*   < > 1.10*  1.40*   < > 1.20* 1.48* 1.10* 1.30* 1.10*  CALCIUM 5.9*   < > 6.6*   < > 7.4*  7.4* 7.2*  --  8.3*  --  9.2  --   --  11.6*  --   --   --   MG 1.4*  --  1.8  --  1.9  --   --  1.8  --   --   --   --  1.6*  --   --   --   PHOS 2.9  --   --    < > 1.4* 1.8*  --  <1.0*  --  2.0*  --   --  2.3*  --   --   --    < > = values in this interval not displayed.   Liver Function Tests: Recent Labs  Lab 10/13/17 0041  10/15/17 0509 10/15/17 1606 10/16/17 0328 10/16/17 1608 10/17/17 0352  AST 80*  --   --   --   --   --   --   ALT 58*  --   --   --   --   --   --   ALKPHOS 154*  --   --   --   --   --   --   BILITOT 1.5*  --   --   --   --   --   --   PROT 6.8  --   --   --   --   --   --   ALBUMIN 3.6   < >  2.7* 2.4* 2.4* 2.4* 2.5*   < > = values in this interval not displayed.   No results for input(s): LIPASE, AMYLASE in the last 168 hours. Recent Labs  Lab 10/13/17 0130  AMMONIA 35   CBC: Recent Labs  Lab 10/13/17 0041 10/13/17 0511  10/14/17 0455  10/16/17 0328  10/16/17 2004 10/17/17 0352 10/17/17 0403 10/17/17 0410 10/17/17 0623  WBC 3.3* 2.7*  --  4.9  --  11.1*  --   --  12.9*  --   --   --   NEUTROABS 2.6  --   --   --   --  6.3  --   --  8.0*  --   --   --   HGB 11.4* 11.4*   < > 9.2*   < > 8.9*   < > 10.2* 9.5* 11.2* 10.9* 11.9*  HCT 33.5* 33.7*   < > 26.2*   < > 25.8*   < > 30.0* 28.1* 33.0* 32.0* 35.0*  MCV  91.0 90.6  --  88.2  --  90.8  --   --  91.2  --   --   --   PLT 94* 77*  --  80*  --  122*  --   --  158  --   --   --    < > = values in this interval not displayed.   Cardiac Enzymes:   Recent Labs  Lab 10/13/17 0511 10/13/17 1318  CKTOTAL  --  890*  TROPONINI 0.15*  --    BNP (last 3 results) No results for input(s): BNP in the last 8760 hours.  ProBNP (last 3 results) No results for input(s): PROBNP in the last 8760 hours.  CBG: Recent Labs  Lab 10/15/17 1603 10/15/17 1941 10/15/17 2325 10/16/17 0322 10/16/17 0739  GLUCAP 85 91 160* 147* 135*    Recent Results (from the past 240 hour(s))  Culture, blood (Routine X 2) w Reflex to ID Panel     Status: None (Preliminary result)   Collection Time: 10/13/17  5:11 AM  Result Value Ref Range Status   Specimen Description   Final    BLOOD LEFT HAND Performed at [facility], [address], [place], [place] [address]    Special Requests   Final    BOTTLES DRAWN AEROBIC AND ANAEROBIC Blood Culture adequate volume Performed at [facility], [address], [place], [place] [address]    Culture   Final    NO GROWTH 4 DAYS Performed at [facility], [address], [place], [place] [address]    Report Status PENDING  Incomplete  Culture, blood (Routine X 2) w Reflex to ID Panel     Status: None (Preliminary result)   Collection Time: 10/13/17  5:28 AM  Result Value Ref Range Status   Specimen Description   Final    BLOOD RIGHT HAND Performed at [facility], [address], [place], [place] [address]    Special Requests   Final    BOTTLES DRAWN AEROBIC AND ANAEROBIC Blood Culture adequate volume Performed at [facility], [address], [place], [place] [address]    Culture   Final    NO GROWTH 4 DAYS Performed at [facility], [address] [place], [place] [address]    Report Status PENDING  Incomplete  MRSA PCR  Screening     Status: None   Collection Time: 10/13/17  8:46 AM  Result Value Ref Range Status   MRSA by PCR NEGATIVE NEGATIVE Final    Comment:        The GeneXpert MRSA Assay (FDA approved for NASAL specimens only), is one component of a comprehensive MRSA colonization surveillance program. It is not intended to diagnose MRSA infection nor to guide or monitor treatment for MRSA infections. Performed at [facility], [address] [place], [place] [address]      Studies: No results found.  Scheduled Meds: . Chlorhexidine Gluconate Cloth  6 each Topical Daily  . folic acid  1 mg Oral Daily  . mouth rinse  15 mL Mouth Rinse BID  . multivitamin with minerals  1 tablet Oral Daily  . pantoprazole  40 mg Oral Daily  . sodium chloride flush  10-40 mL Intracatheter Q12H    Continuous Infusions:   Time spent: I have personally reviewed and interpreted on  10/18/2017 daily labs, tele strips, imagings as discussed above under date review session and assessment and plans.  I reviewed all nursing notes, pharmacy notes, consultant notes,  vitals, pertinent old records  I have discussed plan of care as described above with RN , patient  on 10/18/2017   Albertine Grates MD, PhD  [facility] Pager [phone]. If 7PM-7AM, please contact night-coverage at [email or web address], password [facility] 10/18/2017, 7:34 AM  LOS: 5 days

## 2017-10-18 NOTE — Evaluation (Signed)
Clinical/Bedside Swallow Evaluation Patient Details  Name: Ashley Bradley MRN: [id number] Date of Birth: [birth year]  Today's Date: 10/18/2017 Time: SLP Start Time (ACUTE ONLY): 1235 SLP Stop Time (ACUTE ONLY): 1255 SLP Time Calculation (min) (ACUTE ONLY): 20 min  Past Medical History:  Past Medical History:  Diagnosis Date  . Alcoholism (HCC)   . Complication of anesthesia    hard time waking up  . CP (cerebral palsy) (HCC)   . Depression   . Hypertension   . PONV (postoperative nausea and vomiting)   . Seizures (HCC)    ETOH induced   Past Surgical History:  Past Surgical History:  Procedure Laterality Date  . BLADDER SURGERY    . BREAST SURGERY    . TUBAL LIGATION     HPI:  67 yo [facility] - required intubation for hypovolemic shock secondary to AKI.  Pt with coffee ground emesis, hoarse, sore throat and h/o dysphagia s/p ACDF 10 years ago with dysphagia following.     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   Today pt is dysphonic with weak cough.- ? some pharyngeal/laryngeal edema?  She did well swallowing approximately 2.5 ounces of liquids without indication of airway compromise.  To be safe- recommend clears today with strict precautions and MBS next date.  Informed RN and pt/family Thanks.  SLP Visit Diagnosis: Dysphagia, oropharyngeal phase (R13.12)    Aspiration Risk  Mild aspiration risk    Diet Recommendation Thin liquid(clears)   Liquid Administration via: Cup;Straw Medication Administration: Whole meds with puree Supervision: Patient able to self feed Compensations: Minimize environmental distractions;Slow rate;Small sips/bites    Other  Recommendations Oral Care Recommendations: Oral care BID   Follow up Recommendations   tbd     Frequency and Duration     tbd       Prognosis    tbd     Swallow Study   General Date of Onset: 10/18/17 HPI: 67 yo female [facility] - required intubation for hypovolemic shock secondary to AKI.  Pt with coffee ground emesis, hoarse, sore throat and h/o dysphagia s/p ACDF 10 years ago with dysphagia following.   Type of Study: Bedside Swallow Evaluation Diet Prior to this Study: Regular;Thin liquids Temperature Spikes Noted: Yes Respiratory Status: Nasal cannula(3) History of Recent Intubation: Yes Length of Intubations (days): 3 days Date extubated: 10/17/17 Behavior/Cognition: Alert;Cooperative Oral Cavity Assessment: Other (comment)(abrasions bilateral and midline - soft palate) Oral Care Completed by SLP: No Oral Cavity - Dentition: Adequate natural dentition Vision: Functional for self-feeding Self-Feeding Abilities: Able to feed self Patient Positioning: Upright in bed Baseline Vocal Quality: Low vocal intensity;Hoarse(some hoarsness prior to intubation) Volitional Cough: Weak Volitional Swallow: Able to elicit    Oral/Motor/Sensory Function Overall Oral Motor/Sensory Function: Within functional limits   Ice Chips Ice chips: Within functional limits Presentation: Spoon   Thin Liquid Thin Liquid: Impaired Presentation: Cup;Spoon;Self Fed Pharyngeal  Phase Impairments: Cough - Delayed Other Comments: 3 ounces    Nectar Thick Nectar Thick Liquid: Within functional limits Presentation: Cup   Honey Thick Honey Thick Liquid: Not tested   Puree Puree: Within functional limits Presentation: Self Fed;Spoon   Solid   GO   Solid: Within functional limits Presentation: Self Fed;Spoon        Ashley Bradley 10/18/2017,1:15 PM   Ashley Bradley,  [facility] SLP [phone]

## 2017-10-19 ENCOUNTER — Inpatient Hospital Stay (HOSPITAL_COMMUNITY): Payer: Medicare Other

## 2017-10-19 LAB — CBC WITH DIFFERENTIAL/PLATELET
BASOS PCT: 0 %
Basophils Absolute: 0 10*3/uL (ref 0.0–0.1)
EOS PCT: 0 %
Eosinophils Absolute: 0 10*3/uL (ref 0.0–0.7)
HEMATOCRIT: 22.3 % — AB (ref 36.0–46.0)
HEMOGLOBIN: 7.5 g/dL — AB (ref 12.0–15.0)
LYMPHS ABS: 0.9 10*3/uL (ref 0.7–4.0)
Lymphocytes Relative: 7 %
MCH: 30.2 pg (ref 26.0–34.0)
MCHC: 33.6 g/dL (ref 30.0–36.0)
MCV: 89.9 fL (ref 78.0–100.0)
MONO ABS: 1.8 10*3/uL — AB (ref 0.1–1.0)
MONOS PCT: 14 %
Neutro Abs: 10 10*3/uL — ABNORMAL HIGH (ref 1.7–7.7)
Neutrophils Relative %: 79 %
Platelets: 256 10*3/uL (ref 150–400)
RBC: 2.48 MIL/uL — ABNORMAL LOW (ref 3.87–5.11)
RDW: 17.5 % — ABNORMAL HIGH (ref 11.5–15.5)
WBC: 12.7 10*3/uL — ABNORMAL HIGH (ref 4.0–10.5)

## 2017-10-19 LAB — BASIC METABOLIC PANEL
ANION GAP: 15 (ref 5–15)
BUN: 38 mg/dL — ABNORMAL HIGH (ref 6–20)
CHLORIDE: 88 mmol/L — AB (ref 101–111)
CO2: 34 mmol/L — AB (ref 22–32)
Calcium: 8.2 mg/dL — ABNORMAL LOW (ref 8.9–10.3)
Creatinine, Ser: 2.13 mg/dL — ABNORMAL HIGH (ref 0.44–1.00)
GFR calc non Af Amer: 23 mL/min — ABNORMAL LOW (ref >60)
GFR, EST AFRICAN AMERICAN: 26 mL/min — AB (ref >60)
GLUCOSE: 79 mg/dL (ref 65–99)
Potassium: 3.1 mmol/L — ABNORMAL LOW (ref 3.5–5.1)
Sodium: 137 mmol/L (ref 135–145)

## 2017-10-19 LAB — HEPATIC FUNCTION PANEL
ALBUMIN: 2.1 g/dL — AB (ref 3.5–5.0)
ALT: 30 U/L (ref 14–54)
AST: 38 U/L (ref 15–41)
Alkaline Phosphatase: 143 U/L — ABNORMAL HIGH (ref 38–126)
BILIRUBIN TOTAL: 1.1 mg/dL (ref 0.3–1.2)
Bilirubin, Direct: 0.3 mg/dL (ref 0.1–0.5)
Indirect Bilirubin: 0.8 mg/dL (ref 0.3–0.9)
TOTAL PROTEIN: 5.3 g/dL — AB (ref 6.5–8.1)

## 2017-10-19 LAB — PROCALCITONIN: Procalcitonin: 0.62 ng/mL

## 2017-10-19 LAB — LIPASE, BLOOD: Lipase: 64 U/L — ABNORMAL HIGH (ref 11–51)

## 2017-10-19 LAB — LACTIC ACID, PLASMA: Lactic Acid, Venous: 0.8 mmol/L (ref 0.5–1.9)

## 2017-10-19 MED ORDER — IPRATROPIUM-ALBUTEROL 0.5-2.5 (3) MG/3ML IN SOLN
3.0000 mL | Freq: Once | RESPIRATORY_TRACT | Status: DC
  Filled 2017-10-19: qty 3

## 2017-10-19 MED ORDER — SODIUM CHLORIDE 0.9 % IV SOLN
3.0000 g | Freq: Two times a day (BID) | INTRAVENOUS | Status: DC
  Administered 2017-10-19 (×2): 3 g via INTRAVENOUS
  Filled 2017-10-19 (×2): qty 3

## 2017-10-19 MED ORDER — POTASSIUM CHLORIDE CRYS ER 20 MEQ PO TBCR
40.0000 meq | EXTENDED_RELEASE_TABLET | Freq: Once | ORAL | Status: AC
  Administered 2017-10-19: 40 meq via ORAL
  Filled 2017-10-19: qty 2

## 2017-10-19 MED ORDER — SENNOSIDES-DOCUSATE SODIUM 8.6-50 MG PO TABS
1.0000 | ORAL_TABLET | Freq: Two times a day (BID) | ORAL | Status: DC
  Administered 2017-10-19 – 2017-10-23 (×5): 1 via ORAL
  Filled 2017-10-19 (×6): qty 1

## 2017-10-19 NOTE — Progress Notes (Signed)
Modified Barium Swallow Progress Note  Patient Details  Name: Ashley Bradley MRN: [id number] Date of Birth: [birth year]  Today's Date: 10/19/2017  Modified Barium Swallow completed.  Full report located under Chart Review in the Imaging Section.  Brief recommendations include the following:  Clinical Impression  Patient presents with minimal oropharyngeal dysphagia - No aspiration observed.  Minimal oral residuals noted but pt orally transits and clears.  Trace laryngeal penetration of thin and nectar *flash* noted due to decreased timing of laryngeal closure.  Pt taxed with sequential straw swallows of thin and still did not aspirate.  Barium tablet given with thin momentarily lodged at vallecular region but cleared with furhter liquid boluses.  For maximal safety, recommend pt continue pills with applesauce - follow with liquids.  Pt agreeable to plan.  Pt's dysphagia symptoms were not redemonstrated during MBS but she did cough intermittently without barium visualized in larynx.  Recommend regular/thin diet with aspiration precautions.  Will follow up for dysphagia management, thanks for this order.    Swallow Evaluation Recommendations       SLP Diet Recommendations: Regular solids;Thin liquid   Liquid Administration via: Cup;Straw   Medication Administration: Whole meds with puree   Supervision: Patient able to self feed   Compensations: Minimize environmental distractions;Slow rate;Small sips/bites;Other (Comment)(clear throat intermittently and re-swallow, drink liquids t/o meal)   Postural Changes: Remain semi-upright after after feeds/meals (Comment);Seated upright at 90 degrees   Oral Care Recommendations: Oral care BID       Donavan Burnetamara Jac Romulus, MS [facility] SLP [phone]  Chales AbrahamsKimball, [name] 10/19/2017,9:49 AM

## 2017-10-19 NOTE — Progress Notes (Addendum)
[facility] Progress Note  Subjective: creat down from 2.40 to 2.13 today.  Good UOP.     Vitals:   10/19/17 0500 10/19/17 0600 10/19/17 0800 10/19/17 1600  BP: (!) 121/49 129/63    Pulse: 94 96    Resp: (!) 35 (!) 31    Temp:   (!) 102.6 F (39.2 C) 98.1 F (36.7 C)  TempSrc:   Oral Oral  SpO2: 97% 98%    Weight: 72.3 kg (159 lb 6.3 oz)     Height:        Inpatient medications: . Chlorhexidine Gluconate Cloth  6 each Topical Daily  . folic acid  1 mg Oral Daily  . guaiFENesin  600 mg Oral BID  . ipratropium-albuterol  3 mL Nebulization BID  . ipratropium-albuterol  3 mL Nebulization Once  . mouth rinse  15 mL Mouth Rinse BID  . multivitamin with minerals  1 tablet Oral Daily  . pantoprazole  40 mg Oral Daily  . senna-docusate  1 tablet Oral BID  . sodium chloride flush  10-40 mL Intracatheter Q12H   . sodium chloride 75 mL/hr at 10/19/17 0500  . ampicillin-sulbactam (UNASYN) IV Stopped (10/19/17 1120)   acetaminophen (TYLENOL) oral liquid 160 mg/5 mL, bisacodyl, LORazepam, phenol, prochlorperazine, sodium chloride flush  Exam: Alert, no distress,  No jvd Chest coarse bs bilat RRR Abd obese ntnd no ascites Ext improving 2+ edema NF, moves all ext, alert  UA 4/12 > mod Hb, 100 prot, 0-5 rbc/ 6-30 wbc UNa 31 ECHO - LV 55-60%, PA peak 59 mm Hg, G1DD CXR 4/12   1 am > no acute disease CXR 4/12   5 pm > vasc congestion CXR 4/15   vasc congestion, no edema      Impression: 1  Renal failure - suspected AKI due to ATN from vol depletion/ shock/ ARB. Renal US normal sized kidneys, no hydro.  UA unremarkable.  SP CRRT dc'd on 4/16.  Renal function recovering w/ good UOP and spont decline in creat today to 2.13.  Suspect will cont to improve.  Recommend no acei/ ARB/ nsaids for 2-3 months.  Will sign off.  2  Shock , septic - resolved 3  ETOH abuse 4  Hypercalcemia - resolved 5  Hypophosphatemia - resolved   Plan - as above   Ashley Moselle  MD [facility] pager [phone]   10/19/2017, 5:16 PM   Recent Labs  Lab 10/16/17 1608  10/17/17 0352  10/17/17 0623 10/18/17 0857 10/19/17 0616  NA 139  140   < > 142   < > 139 139 137  K 3.6  3.9   < > 3.9   < > 4.0 3.5 3.1*  CL 89*  92*   < > 86*   < > 80* 84* 88*  CO2 33*  --  41*  --   --  40* 34*  GLUCOSE 180*  140*   < > 135*   < > 218* 87 79  BUN 16  19   < > 15   < > 12 34* 38*  CREATININE 1.10*  1.40*   < > 1.48*   < > 1.10* 2.40* 2.13*  CALCIUM 9.2  --  11.6*  --   --  9.6 8.2*  PHOS 2.0*  --  2.3*  --   --  3.2  --    < > = values in this interval not displayed.   Recent Labs  Lab 10/13/17  0041  10/17/17 0352 10/18/17 0857 10/19/17 0616  AST 80*  --   --  42* 38  ALT 58*  --   --  36 30  ALKPHOS 154*  --   --  168* 143*  BILITOT 1.5*  --   --  1.1 1.1  PROT 6.8  --   --  5.7* 5.3*  ALBUMIN 3.6   < > 2.5* 2.3* 2.1*   < > = values in this interval not displayed.   Recent Labs  Lab 10/17/17 0352  10/17/17 0623 10/18/17 0857 10/19/17 0616  WBC 12.9*  --   --  15.4* 12.7*  NEUTROABS 8.0*  --   --  11.8* 10.0*  HGB 9.5*   < > 11.9* 8.2* 7.5*  HCT 28.1*   < > 35.0* 24.6* 22.3*  MCV 91.2  --   --  91.4 89.9  PLT 158  --   --  229 256   < > = values in this interval not displayed.   Iron/TIBC/Ferritin/ %Sat No results found for: IRON, TIBC, FERRITIN, IRONPCTSAT

## 2017-10-19 NOTE — Progress Notes (Signed)
ASSESSMENT COMPLETED; SEE DOCUMENTATION. PATIENT RETURNED TO ROOM FROM VARIUM SWALLOW; DENIES NEEDS; NO DISTRESS NOTED.

## 2017-10-19 NOTE — Progress Notes (Signed)
SBAR REPORT GIVEN TO KYLE, RN; CARE RELINQUISHED. 

## 2017-10-19 NOTE — Progress Notes (Signed)
PATIENT REFUSED GETTING UP TO ROOM CHAIR; PATIENT STRONGLY ENCOURAGED. PATIENT CONTINUE TO REFUSE R/T TO SLEEPINESS AND C/O FREQUENT STAFF INTERVENTIONS; PT PROMISES TO GET UP TOMORROW.

## 2017-10-19 NOTE — Progress Notes (Signed)
SBAR REPORT RECEIVED FROM Ronaldo MiyamotoKYLE, RN; CARE ASSUMED. PATIENT RESTING IN ROOM BED; DENIES NEEDS; NO DISTRESS NOTED.

## 2017-10-19 NOTE — Progress Notes (Signed)
Pharmacy Antibiotic Note  Ashley Bradley is a 67 y.o. female admitted on 10/13/2017 with AMS, hx EtOH, uremia, ?GIB, AKI requiring CRRT .  Pharmacy has been consulted for Unasyn dosing for aspiration pneumonia.  Today, 10/19/2017: SCr 2.13, CrCl ~ 25 ml/min (s/p CRRT, improving from SCr 2.4 yesterday, baseline SCr 0.4 in 2018) WBC 12.7 Tm 102.6  Plan: Unasyn 3g IV q12h. Follow up renal fxn, culture results, and clinical course.    Height: 5\' 5"  (165.1 cm) Weight: 159 lb 6.3 oz (72.3 kg) IBW/kg (Calculated) : 57  Temp (24hrs), Avg:99.9 F (37.7 C), Min:98.2 F (36.8 C), Max:102.6 F (39.2 C)  Recent Labs  Lab 10/13/17 0103  10/14/17 0455  10/16/17 0328  10/17/17 0352 10/17/17 0403 10/17/17 0410 10/17/17 0623 10/18/17 0857 10/18/17 2040 10/19/17 0616  WBC  --    < > 4.9  --  11.1*  --  12.9*  --   --   --  15.4*  --  12.7*  CREATININE  --    < > 5.80*   < > 1.55*   < > 1.48* 1.10* 1.30* 1.10* 2.40*  --  2.13*  LATICACIDVEN 1.88  --   --   --   --   --   --   --   --   --   --  0.7 0.8   < > = values in this interval not displayed.    Estimated Creatinine Clearance: 25.2 mL/min (A) (by C-G formula based on SCr of 2.13 mg/dL (H)).    Allergies  Allergen Reactions  . Macrodantin [Nitrofurantoin] Nausea And Vomiting    Antimicrobials this admission: 4/18 Unasyn >>   Dose adjustments this admission:  Microbiology results: 4/18 BCx:   4/17 UCx:   4/12 BCx: NGF 4/12 MRSA PCR: negative  Thank you for allowing pharmacy to be a part of this patient's care.  Lynann Beaverhristine Angel Weedon PharmD, BCPS Pager [phone] 10/19/2017 8:49 AM

## 2017-10-19 NOTE — Progress Notes (Signed)
PROGRESS NOTE  Ashley Bradley ZOX:[id number] DOB: Aug 17, 1950 DOA: 10/13/2017 PCP: Sigmund Hazel, MD   Brief summary:   Pt from home called by family on 4/12 for decrease mental status and tremors Pt drinks about 1/5 ETOH per day for the last 2 weeks but only drank 1/2 that amount on 4/12  She is admitted to icu, Required intubation due to AMS / poor airway protection.  Found to have hypovolemic shock with AKI, significantly elevated bun on presentation.  Required CVVHD + pressors for volume removal.  Extubated 4/14.    Concern for GIB with coffee ground emesis x1 on presentation.   She remain profoundly weak, bun/cr improved, but got worse again on 4/17  Spiked fever on 4/18 am, on abx for pneumonia    HPI/Recap of past 24 hours:  Spike fever this am,  nonproductive cough, denies chest pain, she is on 3liter o2 aaox3 Remain very weak, need lift to get her out of bed No vomiting, no hematemesis   She reports generalized edema has much improved    Assessment/Plan: Active Problems:   AKI (acute kidney injury) (HCC)   High anion gap metabolic acidosis   Acute metabolic encephalopathy    Fever on 4/18 -Ct chest " Scattered ground-glass opacities including centrilobular ground-glass nodules and patchy consolidation most conspicuous in RIGHT lower lobe with air bronchograms." - Aspiration pneumonia? Aspiration precaution,  -Blood culture obtained,  -continue mucinex, flutter valve, nebs -Started on unasyn  Acute metabolic encephalopathy (presenting symptom): required intubation. Now extubated, aaox3.  AKI on CKD III: Bun124/cr7.55 on presentation Required CVVHD + pressors for volume removal.  CRRT stopped 4/16 / off neo.   Bun 12/cr 1.1 on 4/16, however, bun 34/cr 2.4 on 4/17, urine output 1.2liter documented, though she reports she has been leaking urine  Post ATN diuresis? Will check ua, imaging to rule out obstruction/stone. Renal dosing meds, repeat lab in  am   Elevated lft tbili 1.5, ast 80, alt 58 on presentation, likely from alcohol intoxication, hypotension.  lft improving, tbili 1.1, ast 42, alt 36 on 4/17 Ct ab/pel from 2017 showed fatty liver Repeat ab imaging now showed "Marked hepatic steatosis."  hepatitis panel in process  Hypokalemia/hypomagnesemia: replace k/mag Repeat in am  Hypocalcemia: Ct ab with signs of pancreatitis, she currently denies pain Monitor calcium  Normocytic anemia: Report hematemesisx1 on presentation No vomiting, hgb down trending, monitor hgb, continue  Ppi, consider gi consult   Alcohol abuse: Need social worker consult, consider naltraxone at discharge.   FTT, will need to start  PT eval if continue to be improving in the next 24hrs.     Code Status: full  Family Communication: patient   Disposition Plan: remain in stepdown today, may be transfer to med tele on 4/19   Consultants:  Critical care  nephrology  Procedures:  CRRT  Intubation/extubation  IJ placement  Antibiotics:  unasyn from 4/18   Objective: BP 133/64   Pulse 99   Temp 99.7 F (37.6 C) (Oral)   Resp (!) 30   Ht 5\' 5"  (1.651 m)   Wt 78.5 kg (173 lb 1 oz)   SpO2 97%   BMI 28.80 kg/m   Intake/Output Summary (Last 24 hours) at 10/19/2017 0752 Last data filed at 10/19/2017 0500 Gross per 24 hour  Intake 653.75 ml  Output 800 ml  Net -146.25 ml   Filed Weights   10/14/17 0200 10/15/17 0500 10/16/17 0400  Weight: 80.6 kg (177 lb 11.1 oz) 81.7  kg (180 lb 1.9 oz) 78.5 kg (173 lb 1 oz)    Exam: Patient is examined daily including today on 10/19/2017, exams remain the same as of yesterday except that has changed    General:  Weak, frail, NAD  Cardiovascular: RRR  Respiratory: diminished at basis, no rhonchi, no wheezing  Abdomen: Soft/ND/NT, positive BS  Musculoskeletal: No Edema  Neuro: alert, oriented   Data Reviewed: Basic Metabolic Panel: Recent Labs  Lab 10/14/17 0455   10/15/17 0509 10/15/17 1606  10/16/17 0328  10/16/17 1608  10/17/17 0352 10/17/17 0403 10/17/17 0410 10/17/17 0623 10/18/17 0857 10/19/17 0616  NA 137   < > 139  139 140   < > 139   < > 139  140   < > 142 140 139 139 139 137  K 3.1*   < > 4.3  4.4 4.8   < > 3.1*   < > 3.6  3.9   < > 3.9 3.8 3.8 4.0 3.5 3.1*  CL 101   < > 104  104 107   < > 101   < > 89*  92*   < > 86* 81* 82* 80* 84* 88*  CO2 17*   < > 23  23 25   --  27  --  33*  --  41*  --   --   --  40* 34*  GLUCOSE 121*   < > 83  81 91   < > 145*   < > 180*  140*   < > 135* 215* 133* 218* 87 79  BUN 104*   < > 58*  58* 46*   < > 28*   < > 16  19   < > 15 13 15 12  34* 38*  CREATININE 5.80*   < > 2.98*  2.98* 2.08*   < > 1.55*   < > 1.10*  1.40*   < > 1.48* 1.10* 1.30* 1.10* 2.40* 2.13*  CALCIUM 6.6*   < > 7.4*  7.4* 7.2*  --  8.3*  --  9.2  --  11.6*  --   --   --  9.6 8.2*  MG 1.8  --  1.9  --   --  1.8  --   --   --  1.6*  --   --   --  1.3*  --   PHOS  --    < > 1.4* 1.8*  --  <1.0*  --  2.0*  --  2.3*  --   --   --  3.2  --    < > = values in this interval not displayed.   Liver Function Tests: Recent Labs  Lab 10/13/17 0041  10/16/17 0328 10/16/17 1608 10/17/17 0352 10/18/17 0857 10/19/17 0616  AST 80*  --   --   --   --  42* 38  ALT 58*  --   --   --   --  36 30  ALKPHOS 154*  --   --   --   --  168* 143*  BILITOT 1.5*  --   --   --   --  1.1 1.1  PROT 6.8  --   --   --   --  5.7* 5.3*  ALBUMIN 3.6   < > 2.4* 2.4* 2.5* 2.3* 2.1*   < > = values in this interval not displayed.   Recent Labs  Lab 10/19/17 0616  LIPASE 64*   Recent Labs  Lab 10/13/17 0130  AMMONIA 35   CBC: Recent Labs  Lab 10/13/17 0041  10/14/17 0455  10/16/17 0328  10/17/17 0352 10/17/17 0403 10/17/17 0410 10/17/17 0623 10/18/17 0857 10/19/17 0616  WBC 3.3*   < > 4.9  --  11.1*  --  12.9*  --   --   --  15.4* 12.7*  NEUTROABS 2.6  --   --   --  6.3  --  8.0*  --   --   --  11.8* 10.0*  HGB 11.4*   < > 9.2*   < >  8.9*   < > 9.5* 11.2* 10.9* 11.9* 8.2* 7.5*  HCT 33.5*   < > 26.2*   < > 25.8*   < > 28.1* 33.0* 32.0* 35.0* 24.6* 22.3*  MCV 91.0   < > 88.2  --  90.8  --  91.2  --   --   --  91.4 89.9  PLT 94*   < > 80*  --  122*  --  158  --   --   --  229 256   < > = values in this interval not displayed.   Cardiac Enzymes:   Recent Labs  Lab 10/13/17 0511 10/13/17 1318  CKTOTAL  --  890*  TROPONINI 0.15*  --    BNP (last 3 results) No results for input(s): BNP in the last 8760 hours.  ProBNP (last 3 results) No results for input(s): PROBNP in the last 8760 hours.  CBG: Recent Labs  Lab 10/15/17 1603 10/15/17 1941 10/15/17 2325 10/16/17 0322 10/16/17 0739  GLUCAP 85 91 160* 147* 135*    Recent Results (from the past 240 hour(s))  Culture, blood (Routine X 2) w Reflex to ID Panel     Status: None   Collection Time: 10/13/17  5:11 AM  Result Value Ref Range Status   Specimen Description   Final    BLOOD LEFT HAND Performed at [facility], [address] [place], [place] [address]    Special Requests   Final    BOTTLES DRAWN AEROBIC AND ANAEROBIC Blood Culture adequate volume Performed at [facility], [address] [place], [place] [address]    Culture   Final    NO GROWTH 5 DAYS Performed at [facility], [address] [place], [place] [address]    Report Status 10/18/2017 FINAL  Final  Culture, blood (Routine X 2) w Reflex to ID Panel     Status: None   Collection Time: 10/13/17  5:28 AM  Result Value Ref Range Status   Specimen Description   Final    BLOOD RIGHT HAND Performed at [facility], [address] [place], [place] [address]    Special Requests   Final    BOTTLES DRAWN AEROBIC AND ANAEROBIC Blood Culture adequate volume Performed at [facility], [address] [place], [place] [address]    Culture   Final    NO GROWTH 5 DAYS Performed at [facility], [address] [place], [place] [address]    Report Status 10/18/2017 FINAL  Final  MRSA PCR Screening     Status: None   Collection Time: 10/13/17  8:46 AM  Result Value Ref Range Status   MRSA by PCR NEGATIVE NEGATIVE Final    Comment:        The GeneXpert MRSA Assay (FDA approved for NASAL specimens only), is one component  of a comprehensive MRSA colonization surveillance program. It is not intended to diagnose MRSA infection nor to guide or monitor treatment for MRSA infections. Performed at [facility], [address] [place], [place] [address]      Studies: Ct Chest Wo Contrast  Result Date: 10/18/2017 CLINICAL DATA:  Cough, acute kidney failure. History of bladder surgery and urinary tract infections. EXAM: CT CHEST, ABDOMEN AND PELVIS WITHOUT CONTRAST TECHNIQUE: Multidetector CT imaging of the chest, abdomen and pelvis was performed following the standard protocol without IV contrast. COMPARISON:  Chest radiograph October 16, 2017, abdominal radiograph October 13, 2017 and CT abdomen and pelvis January 16, 2016 FINDINGS: CT CHEST FINDINGS CARDIOVASCULAR: Heart and pericardium are unremarkable. Thoracic aorta is normal course and caliber, unremarkable. MEDIASTINUM/NODES: No mediastinal mass. No lymphadenopathy by CT size criteria limited assessment without contrast. Normal appearance of thoracic esophagus though not tailored for evaluation. RIGHT internal jugular central venous catheter terminates in mid superior vena cava. LUNGS/PLEURA: Tracheobronchial tree is patent, no pneumothorax. Prominent vascular structures corresponding to hilar prominence on prior radiograph. Small RIGHT and trace LEFT pleural effusion. Scattered ground-glass opacities including centrilobular ground-glass nodules and patchy consolidation most conspicuous in RIGHT lower lobe with air bronchograms. MUSCULOSKELETAL: Nonacute. Calcified bilateral breast implants. Elevated RIGHT hemidiaphragm. ACDF.  Scattered Schmorl's nodes. CT ABDOMEN PELVIS FINDINGS HEPATOBILIARY: Diffusely markedly hypodense liver. Similarly dense gallbladder without CT findings of acute cholecystitis. PANCREAS: Normal.  Very slight peripancreatic fat stranding. SPLEEN: Normal. ADRENALS/URINARY TRACT: Kidneys are orthotopic, demonstrating normal size and morphology. Mild nonspecific RIGHT perinephric fat stranding. No nephrolithiasis, hydronephrosis; limited assessment for renal masses on this nonenhanced examination. The unopacified ureters are normal in course and caliber. Urinary bladder is well distended with nondependent gas and mild perivesicular fat stranding. Normal adrenal glands. STOMACH/BOWEL: The stomach, small and large bowel are normal in course and caliber without inflammatory changes, sensitivity decreased by lack of enteric contrast. Normal appendix. VASCULAR/LYMPHATIC: Aortoiliac vessels are normal in course and caliber. Trace calcific atherosclerosis. No lymphadenopathy by CT size criteria. REPRODUCTIVE: Normal. OTHER: No intraperitoneal free fluid or free air. MUSCULOSKELETAL: Non-acute. Chronic deformity RIGHT iliac wing. Small bilateral hip effusions. Minimal grade 1 L3-4 anterolisthesis, multilevel moderate to severe facet arthropathy. Moderate to severe L5-S1 degenerative disc. IMPRESSION: CT CHEST: 1. Scattered ground-glass opacities and dense consolidations concerning for pneumonia, less likely pulmonary edema. Small RIGHT and trace LEFT pleural effusions. CT ABDOMEN AND PELVIS: 1. Mild urinary bladder wall thickening with gas concerning for cystitis, recommend correlation with recent instrumentation. 2. Marked hepatic steatosis. 3. Slight peripancreatic fat stranding, potential pancreatitis. Recommend correlation with amylase and lipase. Aortic Atherosclerosis (ICD10-I70.0). Electronically Signed   By: Awilda Metro M.D.   On: 10/18/2017 17:53   Ct Renal Stone Study  Result Date: 10/18/2017 CLINICAL DATA:   Cough, acute kidney failure. History of bladder surgery and urinary tract infections. EXAM: CT CHEST, ABDOMEN AND PELVIS WITHOUT CONTRAST TECHNIQUE: Multidetector CT imaging of the chest, abdomen and pelvis was performed following the standard protocol without IV contrast. COMPARISON:  Chest radiograph October 16, 2017, abdominal radiograph October 13, 2017 and CT abdomen and pelvis January 16, 2016 FINDINGS: CT CHEST FINDINGS CARDIOVASCULAR: Heart and pericardium are unremarkable. Thoracic aorta is normal course and caliber, unremarkable. MEDIASTINUM/NODES: No mediastinal mass. No lymphadenopathy by CT size criteria limited assessment without contrast. Normal appearance of thoracic esophagus though not tailored for evaluation. RIGHT internal jugular central venous catheter terminates in mid superior vena cava. LUNGS/PLEURA: Tracheobronchial tree is patent, no pneumothorax. Prominent vascular  structures corresponding to hilar prominence on prior radiograph. Small RIGHT and trace LEFT pleural effusion. Scattered ground-glass opacities including centrilobular ground-glass nodules and patchy consolidation most conspicuous in RIGHT lower lobe with air bronchograms. MUSCULOSKELETAL: Nonacute. Calcified bilateral breast implants. Elevated RIGHT hemidiaphragm. ACDF. Scattered Schmorl's nodes. CT ABDOMEN PELVIS FINDINGS HEPATOBILIARY: Diffusely markedly hypodense liver. Similarly dense gallbladder without CT findings of acute cholecystitis. PANCREAS: Normal.  Very slight peripancreatic fat stranding. SPLEEN: Normal. ADRENALS/URINARY TRACT: Kidneys are orthotopic, demonstrating normal size and morphology. Mild nonspecific RIGHT perinephric fat stranding. No nephrolithiasis, hydronephrosis; limited assessment for renal masses on this nonenhanced examination. The unopacified ureters are normal in course and caliber. Urinary bladder is well distended with nondependent gas and mild perivesicular fat stranding. Normal adrenal glands.  STOMACH/BOWEL: The stomach, small and large bowel are normal in course and caliber without inflammatory changes, sensitivity decreased by lack of enteric contrast. Normal appendix. VASCULAR/LYMPHATIC: Aortoiliac vessels are normal in course and caliber. Trace calcific atherosclerosis. No lymphadenopathy by CT size criteria. REPRODUCTIVE: Normal. OTHER: No intraperitoneal free fluid or free air. MUSCULOSKELETAL: Non-acute. Chronic deformity RIGHT iliac wing. Small bilateral hip effusions. Minimal grade 1 L3-4 anterolisthesis, multilevel moderate to severe facet arthropathy. Moderate to severe L5-S1 degenerative disc. IMPRESSION: CT CHEST: 1. Scattered ground-glass opacities and dense consolidations concerning for pneumonia, less likely pulmonary edema. Small RIGHT and trace LEFT pleural effusions. CT ABDOMEN AND PELVIS: 1. Mild urinary bladder wall thickening with gas concerning for cystitis, recommend correlation with recent instrumentation. 2. Marked hepatic steatosis. 3. Slight peripancreatic fat stranding, potential pancreatitis. Recommend correlation with amylase and lipase. Aortic Atherosclerosis (ICD10-I70.0). Electronically Signed   By: Awilda Metro M.D.   On: 10/18/2017 17:53    Scheduled Meds: . Chlorhexidine Gluconate Cloth  6 each Topical Daily  . folic acid  1 mg Oral Daily  . guaiFENesin  600 mg Oral BID  . ipratropium-albuterol  3 mL Nebulization BID  . mouth rinse  15 mL Mouth Rinse BID  . multivitamin with minerals  1 tablet Oral Daily  . pantoprazole  40 mg Oral Daily  . sodium chloride flush  10-40 mL Intracatheter Q12H    Continuous Infusions: . sodium chloride 75 mL/hr at 10/19/17 0500     Time spent: I have personally reviewed and interpreted on  10/19/2017 daily labs, tele strips, imagings as discussed above under date review session and assessment and plans.  I reviewed all nursing notes, pharmacy notes, consultant notes,  vitals, pertinent old records  I  have discussed plan of care as described above with RN , patient  on 10/19/2017   Albertine Grates MD, PhD  [facility] Pager [phone]. If 7PM-7AM, please contact night-coverage at [email or web address], password [facility] 10/19/2017, 7:52 AM  LOS: 6 days

## 2017-10-19 NOTE — Progress Notes (Signed)
RT attempted to administer breathing tx, but patient was resting. Per RN, patient is tired and wanted to rest. Flutter at bedside. Will administer tx at later time.

## 2017-10-20 ENCOUNTER — Encounter (HOSPITAL_COMMUNITY): Payer: Self-pay | Admitting: Gastroenterology

## 2017-10-20 DIAGNOSIS — G40509 Epileptic seizures related to external causes, not intractable, without status epilepticus: Secondary | ICD-10-CM

## 2017-10-20 DIAGNOSIS — Z881 Allergy status to other antibiotic agents status: Secondary | ICD-10-CM

## 2017-10-20 DIAGNOSIS — I10 Essential (primary) hypertension: Secondary | ICD-10-CM

## 2017-10-20 DIAGNOSIS — F339 Major depressive disorder, recurrent, unspecified: Secondary | ICD-10-CM

## 2017-10-20 DIAGNOSIS — R571 Hypovolemic shock: Secondary | ICD-10-CM

## 2017-10-20 DIAGNOSIS — F1020 Alcohol dependence, uncomplicated: Secondary | ICD-10-CM

## 2017-10-20 DIAGNOSIS — J189 Pneumonia, unspecified organism: Secondary | ICD-10-CM

## 2017-10-20 DIAGNOSIS — B9561 Methicillin susceptible Staphylococcus aureus infection as the cause of diseases classified elsewhere: Secondary | ICD-10-CM

## 2017-10-20 DIAGNOSIS — R7881 Bacteremia: Secondary | ICD-10-CM

## 2017-10-20 LAB — CBC WITH DIFFERENTIAL/PLATELET
BASOS ABS: 0 10*3/uL (ref 0.0–0.1)
Basophils Relative: 0 %
EOS PCT: 0 %
Eosinophils Absolute: 0 10*3/uL (ref 0.0–0.7)
HCT: 20 % — ABNORMAL LOW (ref 36.0–46.0)
Hemoglobin: 6.8 g/dL — CL (ref 12.0–15.0)
LYMPHS PCT: 7 %
Lymphs Abs: 0.7 10*3/uL (ref 0.7–4.0)
MCH: 30.2 pg (ref 26.0–34.0)
MCHC: 34 g/dL (ref 30.0–36.0)
MCV: 88.9 fL (ref 78.0–100.0)
MONO ABS: 1.2 10*3/uL — AB (ref 0.1–1.0)
Monocytes Relative: 11 %
Neutro Abs: 8.5 10*3/uL — ABNORMAL HIGH (ref 1.7–7.7)
Neutrophils Relative %: 82 %
PLATELETS: 318 10*3/uL (ref 150–400)
RBC: 2.25 MIL/uL — ABNORMAL LOW (ref 3.87–5.11)
RDW: 17.4 % — AB (ref 11.5–15.5)
WBC: 10.5 10*3/uL (ref 4.0–10.5)

## 2017-10-20 LAB — CBC
HEMATOCRIT: 24.7 % — AB (ref 36.0–46.0)
HEMOGLOBIN: 8.5 g/dL — AB (ref 12.0–15.0)
MCH: 30 pg (ref 26.0–34.0)
MCHC: 34.4 g/dL (ref 30.0–36.0)
MCV: 87.3 fL (ref 78.0–100.0)
Platelets: 363 10*3/uL (ref 150–400)
RBC: 2.83 MIL/uL — AB (ref 3.87–5.11)
RDW: 16.7 % — ABNORMAL HIGH (ref 11.5–15.5)
WBC: 7.5 10*3/uL (ref 4.0–10.5)

## 2017-10-20 LAB — BLOOD CULTURE ID PANEL (REFLEXED)
Acinetobacter baumannii: NOT DETECTED
CANDIDA KRUSEI: NOT DETECTED
CANDIDA PARAPSILOSIS: NOT DETECTED
Candida albicans: NOT DETECTED
Candida glabrata: NOT DETECTED
Candida tropicalis: NOT DETECTED
ESCHERICHIA COLI: NOT DETECTED
Enterobacter cloacae complex: NOT DETECTED
Enterobacteriaceae species: NOT DETECTED
Enterococcus species: NOT DETECTED
Haemophilus influenzae: NOT DETECTED
KLEBSIELLA PNEUMONIAE: NOT DETECTED
Klebsiella oxytoca: NOT DETECTED
Listeria monocytogenes: NOT DETECTED
Methicillin resistance: NOT DETECTED
Neisseria meningitidis: NOT DETECTED
PROTEUS SPECIES: NOT DETECTED
Pseudomonas aeruginosa: NOT DETECTED
SERRATIA MARCESCENS: NOT DETECTED
STAPHYLOCOCCUS AUREUS BCID: DETECTED — AB
STAPHYLOCOCCUS SPECIES: DETECTED — AB
Streptococcus agalactiae: NOT DETECTED
Streptococcus pneumoniae: NOT DETECTED
Streptococcus pyogenes: NOT DETECTED
Streptococcus species: NOT DETECTED

## 2017-10-20 LAB — BASIC METABOLIC PANEL
Anion gap: 13 (ref 5–15)
Anion gap: 12 (ref 5–15)
BUN: 31 mg/dL — AB (ref 6–20)
BUN: 26 mg/dL — ABNORMAL HIGH (ref 6–20)
CALCIUM: 7.4 mg/dL — AB (ref 8.9–10.3)
CHLORIDE: 94 mmol/L — AB (ref 101–111)
CO2: 33 mmol/L — AB (ref 22–32)
CO2: 31 mmol/L (ref 22–32)
CREATININE: 1.66 mg/dL — AB (ref 0.44–1.00)
Calcium: 7.2 mg/dL — ABNORMAL LOW (ref 8.9–10.3)
Chloride: 90 mmol/L — ABNORMAL LOW (ref 101–111)
Creatinine, Ser: 1.48 mg/dL — ABNORMAL HIGH (ref 0.44–1.00)
GFR calc Af Amer: 41 mL/min — ABNORMAL LOW (ref >60)
GFR calc non Af Amer: 31 mL/min — ABNORMAL LOW (ref >60)
GFR calc non Af Amer: 35 mL/min — ABNORMAL LOW (ref >60)
GFR, EST AFRICAN AMERICAN: 36 mL/min — AB (ref >60)
GLUCOSE: 112 mg/dL — AB (ref 65–99)
Glucose, Bld: 89 mg/dL (ref 65–99)
POTASSIUM: 2.8 mmol/L — AB (ref 3.5–5.1)
Potassium: 2.7 mmol/L — CL (ref 3.5–5.1)
SODIUM: 137 mmol/L (ref 135–145)
Sodium: 136 mmol/L (ref 135–145)

## 2017-10-20 LAB — HEPATITIS PANEL, ACUTE
HEP B S AG: NEGATIVE
Hep A IgM: NEGATIVE
Hep B C IgM: NEGATIVE

## 2017-10-20 LAB — RETICULOCYTES
RBC.: 2.25 MIL/uL — ABNORMAL LOW (ref 3.87–5.11)
Retic Count, Absolute: 83.3 10*3/uL (ref 19.0–186.0)
Retic Ct Pct: 3.7 % — ABNORMAL HIGH (ref 0.4–3.1)

## 2017-10-20 LAB — FOLATE: Folate: 26.3 ng/mL (ref >5.9)

## 2017-10-20 LAB — CALCIUM, IONIZED: CALCIUM, IONIZED, SERUM: 4.8 mg/dL (ref 4.5–5.6)

## 2017-10-20 LAB — MAGNESIUM
MAGNESIUM: 1.2 mg/dL — AB (ref 1.7–2.4)
MAGNESIUM: 2.1 mg/dL (ref 1.7–2.4)

## 2017-10-20 LAB — IRON AND TIBC
IRON: 12 ug/dL — AB (ref 28–170)
SATURATION RATIOS: 9 % — AB (ref 10.4–31.8)
TIBC: 134 ug/dL — AB (ref 250–450)
UIBC: 122 ug/dL

## 2017-10-20 LAB — PREPARE RBC (CROSSMATCH)

## 2017-10-20 LAB — VITAMIN B12: VITAMIN B 12: 1881 pg/mL — AB (ref 180–914)

## 2017-10-20 LAB — TSH: TSH: 2.347 u[IU]/mL (ref 0.350–4.500)

## 2017-10-20 LAB — PROCALCITONIN: Procalcitonin: 0.67 ng/mL

## 2017-10-20 LAB — LIPASE, BLOOD: Lipase: 69 U/L — ABNORMAL HIGH (ref 11–51)

## 2017-10-20 MED ORDER — POTASSIUM CHLORIDE CRYS ER 20 MEQ PO TBCR: 40.0000 meq | EXTENDED_RELEASE_TABLET | Freq: Once | ORAL | Status: DC

## 2017-10-20 MED ORDER — ACETAMINOPHEN 325 MG PO TABS
650.0000 mg | ORAL_TABLET | Freq: Once | ORAL | Status: AC
  Administered 2017-10-20: 650 mg via ORAL
  Filled 2017-10-20: qty 2

## 2017-10-20 MED ORDER — POTASSIUM CHLORIDE CRYS ER 20 MEQ PO TBCR
20.0000 meq | EXTENDED_RELEASE_TABLET | Freq: Once | ORAL | Status: AC
  Administered 2017-10-20: 20 meq via ORAL
  Filled 2017-10-20: qty 1

## 2017-10-20 MED ORDER — POTASSIUM CHLORIDE CRYS ER 20 MEQ PO TBCR
40.0000 meq | EXTENDED_RELEASE_TABLET | ORAL | Status: AC
  Administered 2017-10-20 (×2): 40 meq via ORAL
  Filled 2017-10-20 (×2): qty 2

## 2017-10-20 MED ORDER — SODIUM CHLORIDE 0.9 % IV SOLN
Freq: Once | INTRAVENOUS | Status: AC
  Administered 2017-10-20: 11:00:00 via INTRAVENOUS

## 2017-10-20 MED ORDER — CEFAZOLIN SODIUM-DEXTROSE 2-4 GM/100ML-% IV SOLN
2.0000 g | Freq: Three times a day (TID) | INTRAVENOUS | Status: DC
  Administered 2017-10-20 – 2017-10-21 (×6): 2 g via INTRAVENOUS
  Filled 2017-10-20 (×9): qty 100

## 2017-10-20 MED ORDER — DIPHENHYDRAMINE HCL 25 MG PO CAPS
25.0000 mg | ORAL_CAPSULE | Freq: Once | ORAL | Status: AC
  Administered 2017-10-20: 25 mg via ORAL
  Filled 2017-10-20: qty 1

## 2017-10-20 MED ORDER — POTASSIUM CHLORIDE 10 MEQ/50ML IV SOLN
10.0000 meq | INTRAVENOUS | Status: AC
  Administered 2017-10-20 (×3): 10 meq via INTRAVENOUS
  Filled 2017-10-20 (×3): qty 50

## 2017-10-20 MED ORDER — METRONIDAZOLE 500 MG PO TABS
500.0000 mg | ORAL_TABLET | Freq: Three times a day (TID) | ORAL | Status: AC
  Administered 2017-10-20 – 2017-10-23 (×7): 500 mg via ORAL
  Filled 2017-10-20 (×8): qty 1

## 2017-10-20 MED ORDER — MAGNESIUM SULFATE 4 GM/100ML IV SOLN
4.0000 g | Freq: Once | INTRAVENOUS | Status: AC
  Administered 2017-10-20: 4 g via INTRAVENOUS
  Filled 2017-10-20: qty 100

## 2017-10-20 NOTE — Progress Notes (Signed)
PROGRESS NOTE  CLARINDA OBI WRU:[id number] DOB: 21-Apr-1951 DOA: 10/13/2017 PCP: Sigmund Hazel, MD   Brief summary:   Pt from home called by family on 4/12 for decrease mental status and tremors Pt drinks about 1/5 ETOH per day for the last 2 weeks but only drank 1/2 that amount on 4/12  She is admitted to icu, Required intubation due to AMS / poor airway protection.  Found to have hypovolemic shock with AKI, significantly elevated bun on presentation.  Required CVVHD + pressors for volume removal.  Extubated 4/14.    Concern for GIB with coffee ground emesis x1 on presentation.   She remain profoundly weak,   Spiked fever on 4/18 am, on abx for pneumonia/mssa bacteremia    HPI/Recap of past 24 hours:  Continue to be febrile hgb dropped, bp dropped to 70's around midnight Urine output 2liter last 24hrs  Remain very weak, need lift to get her out of bed She denies abdominal pain, No vomiting, no hematemesis      Assessment/Plan: Active Problems:   AKI (acute kidney injury) (HCC)   High anion gap metabolic acidosis   Acute metabolic encephalopathy    MSSA bacteremia/ right lower lobe pneumonia -she developed Fever on 4/18 -Ct chest " Scattered ground-glass opacities including centrilobular ground-glass nodules and patchy consolidation most conspicuous in RIGHT lower lobe with air bronchograms." - Aspiration pneumonia? Aspiration precaution,  -Blood culture +MSSA -continue mucinex, flutter valve, nebs -she is started on unasyn since 4/18, abx changed to ancef plus flagyl on 4/19 -Discontinue central line -repeat echocardiogram, repeat blood culture -infectious disease input appreciated  Acute Normocytic anemia: Report hematemesisx1 on presentation hgb 9-10 at baseline hgb trending down to 6.8 today, FOBT+, with h/o alcohol use prbc transfusion x1 on 4/19 continue  Ppi,  gi consult , case discussed with GI Dr Ewing Schlein.  Acute metabolic encephalopathy  (presenting symptom): required intubation. Now extubated, aaox3.  AKI on CKD III: Bun124/cr7.55 on presentation Required CVVHD + pressors for volume removal.  CRRT stopped 4/16 / off neo.   Over all improving Renal dosing meds, repeat lab in am  Elevated lft tbili 1.5, ast 80, alt 58 on presentation, likely from alcohol intoxication, hypotension.  lft improving Ct ab/pel from 2017 showed fatty liver Repeat ab imaging now showed "Marked hepatic steatosis."  hepatitis panel negative  Hypokalemia/hypomagnesemia: Remain low, continue to replace k/mag Repeat in am  Hypocalcemia: Ct ab with signs of pancreatitis, she currently denies pain improved    Alcohol abuse: Need social worker consult, consider naltraxone at discharge.   FTT, will need to start  PT eval if continue to be improving in the next 24hrs.     Code Status: full  Family Communication: patient   Disposition Plan: remain in stepdown    Consultants:  Critical care  Nephrology  Infectious disease  ID  Procedures:  CRRT  Intubation/extubation  IJ placement and removal  Antibiotics:  unasyn from 4/18  Ancef /flagyl from 4/19   Objective: BP 97/62   Pulse 81   Temp 100.1 F (37.8 C) (Oral)   Resp (!) 21   Ht 5\' 5"  (1.651 m)   Wt 72.4 kg (159 lb 9.8 oz)   SpO2 100%   BMI 26.56 kg/m   Intake/Output Summary (Last 24 hours) at 10/20/2017 0825 Last data filed at 10/20/2017 0744 Gross per 24 hour  Intake 2180 ml  Output 2000 ml  Net 180 ml   Filed Weights   10/16/17 0400 10/19/17 0500  10/20/17 0500  Weight: 78.5 kg (173 lb 1 oz) 72.3 kg (159 lb 6.3 oz) 72.4 kg (159 lb 9.8 oz)    Exam: Patient is examined daily including today on 10/20/2017, exams remain the same as of yesterday except that has changed    General:  Weak, frail, NAD  Cardiovascular: RRR  Respiratory: diminished at basis, no rhonchi, no wheezing  Abdomen: Soft/ND/NT, positive BS  Musculoskeletal: No  Edema  Neuro: alert, oriented   Data Reviewed: Basic Metabolic Panel: Recent Labs  Lab 10/15/17 0509 10/15/17 1606  10/16/17 0328  10/16/17 1608  10/17/17 0352  10/17/17 0410 10/17/17 0623 10/18/17 0857 10/19/17 0616 10/20/17 0353  NA 139  139 140   < > 139   < > 139  140   < > 142   < > 139 139 139 137 136  K 4.3  4.4 4.8   < > 3.1*   < > 3.6  3.9   < > 3.9   < > 3.8 4.0 3.5 3.1* 2.7*  CL 104  104 107   < > 101   < > 89*  92*   < > 86*   < > 82* 80* 84* 88* 90*  CO2 23  23 25   --  27  --  33*  --  41*  --   --   --  40* 34* 33*  GLUCOSE 83  81 91   < > 145*   < > 180*  140*   < > 135*   < > 133* 218* 87 79 112*  BUN 58*  58* 46*   < > 28*   < > 16  19   < > 15   < > 15 12 34* 38* 31*  CREATININE 2.98*  2.98* 2.08*   < > 1.55*   < > 1.10*  1.40*   < > 1.48*   < > 1.30* 1.10* 2.40* 2.13* 1.66*  CALCIUM 7.4*  7.4* 7.2*  --  8.3*  --  9.2  --  11.6*  --   --   --  9.6 8.2* 7.4*  MG 1.9  --   --  1.8  --   --   --  1.6*  --   --   --  1.3*  --  1.2*  PHOS 1.4* 1.8*  --  <1.0*  --  2.0*  --  2.3*  --   --   --  3.2  --   --    < > = values in this interval not displayed.   Liver Function Tests: Recent Labs  Lab 10/16/17 0328 10/16/17 1608 10/17/17 0352 10/18/17 0857 10/19/17 0616  AST  --   --   --  42* 38  ALT  --   --   --  36 30  ALKPHOS  --   --   --  168* 143*  BILITOT  --   --   --  1.1 1.1  PROT  --   --   --  5.7* 5.3*  ALBUMIN 2.4* 2.4* 2.5* 2.3* 2.1*   Recent Labs  Lab 10/19/17 0616 10/20/17 0353  LIPASE 64* 69*   No results for input(s): AMMONIA in the last 168 hours. CBC: Recent Labs  Lab 10/16/17 0328  10/17/17 0352  10/17/17 0410 10/17/17 0623 10/18/17 0857 10/19/17 0616 10/20/17 0353  WBC 11.1*  --  12.9*  --   --   --  15.4* 12.7* 10.5  NEUTROABS 6.3  --  8.0*  --   --   --  11.8* 10.0* 8.5*  HGB 8.9*   < > 9.5*   < > 10.9* 11.9* 8.2* 7.5* 6.8*  HCT 25.8*   < > 28.1*   < > 32.0* 35.0* 24.6* 22.3* 20.0*  MCV 90.8  --  91.2  --    --   --  91.4 89.9 88.9  PLT 122*  --  158  --   --   --  229 256 318   < > = values in this interval not displayed.   Cardiac Enzymes:   Recent Labs  Lab 10/13/17 1318  CKTOTAL 890*   BNP (last 3 results) No results for input(s): BNP in the last 8760 hours.  ProBNP (last 3 results) No results for input(s): PROBNP in the last 8760 hours.  CBG: Recent Labs  Lab 10/15/17 1603 10/15/17 1941 10/15/17 2325 10/16/17 0322 10/16/17 0739  GLUCAP 85 91 160* 147* 135*    Recent Results (from the past 240 hour(s))  Culture, blood (Routine X 2) w Reflex to ID Panel     Status: None   Collection Time: 10/13/17  5:11 AM  Result Value Ref Range Status   Specimen Description   Final    BLOOD LEFT HAND Performed at [facility], [address] [place], [place] [address]    Special Requests   Final    BOTTLES DRAWN AEROBIC AND ANAEROBIC Blood Culture adequate volume Performed at [facility], [name] [place], [place] [address]    Culture   Final    NO GROWTH 5 DAYS Performed at [facility], [address] [place], [place] [address]    Report Status 10/18/2017 FINAL  Final  Culture, blood (Routine X 2) w Reflex to ID Panel     Status: None   Collection Time: 10/13/17  5:28 AM  Result Value Ref Range Status   Specimen Description   Final    BLOOD RIGHT HAND Performed at [facility], [address] [place], [place] [address]    Special Requests   Final    BOTTLES DRAWN AEROBIC AND ANAEROBIC Blood Culture adequate volume Performed at [facility], [address] [place], [place] [address]    Culture   Final    NO GROWTH 5 DAYS Performed at [facility], [address] [place], [place] [address]    Report Status 10/18/2017 FINAL  Final  MRSA PCR Screening     Status: None   Collection Time: 10/13/17  8:46 AM  Result Value Ref Range Status   MRSA by PCR NEGATIVE NEGATIVE Final     Comment:        The GeneXpert MRSA Assay (FDA approved for NASAL specimens only), is one component of a comprehensive MRSA colonization surveillance program. It is not intended to diagnose MRSA infection nor to guide or monitor treatment for MRSA infections. Performed at [facility], [address] [place], [place] [address]   Culture, blood (routine x 2)     Status: None (Preliminary result)   Collection Time: 10/19/17 10:02 AM  Result Value Ref Range Status   Specimen Description BLOOD RIGHT ANTECUBITAL  Final   Special Requests   Final    BOTTLES DRAWN AEROBIC AND ANAEROBIC Blood Culture adequate volume Performed at [facility], [address] [place], [place] [address]    Culture  Setup Time   Final    GRAM POSITIVE COCCI IN BOTH AEROBIC AND ANAEROBIC BOTTLES Organism ID to follow CRITICAL RESULT CALLED TO, READ BACK BY AND VERIFIED WITH: B GREEN PHARMD 10/20/17 0405 JDW    Culture GRAM POSITIVE COCCI  Final   Report Status PENDING  Incomplete  Blood Culture ID Panel (Reflexed)     Status: Abnormal   Collection Time: 10/19/17 10:02 AM  Result Value Ref Range Status   Enterococcus species NOT DETECTED NOT DETECTED Final   Listeria monocytogenes NOT DETECTED NOT DETECTED Final   Staphylococcus species DETECTED (A) NOT DETECTED Final    Comment: CRITICAL RESULT CALLED TO, READ BACK BY AND VERIFIED WITH: B GREEN PHARMD 10/20/17 0405 JDW    Staphylococcus aureus DETECTED (A) NOT DETECTED Final    Comment: Methicillin (oxacillin) susceptible Staphylococcus aureus (MSSA). Preferred therapy is anti staphylococcal beta lactam antibiotic (Cefazolin or Nafcillin), unless clinically contraindicated. CRITICAL RESULT CALLED TO, READ BACK BY AND VERIFIED WITH: B GREEN PHARMD 10/20/17 0405 JDW    Methicillin resistance NOT DETECTED NOT DETECTED Final   Streptococcus species NOT DETECTED NOT DETECTED Final   Streptococcus agalactiae NOT  DETECTED NOT DETECTED Final   Streptococcus pneumoniae NOT DETECTED NOT DETECTED Final   Streptococcus pyogenes NOT DETECTED NOT DETECTED Final   Acinetobacter baumannii NOT DETECTED NOT DETECTED Final   Enterobacteriaceae species NOT DETECTED NOT DETECTED Final   Enterobacter cloacae complex NOT DETECTED NOT DETECTED Final   Escherichia coli NOT DETECTED NOT DETECTED Final   Klebsiella oxytoca NOT DETECTED NOT DETECTED Final   Klebsiella pneumoniae NOT DETECTED NOT DETECTED Final   Proteus species NOT DETECTED NOT DETECTED Final   Serratia marcescens NOT DETECTED NOT DETECTED Final   Haemophilus influenzae NOT DETECTED NOT DETECTED Final   Neisseria meningitidis NOT DETECTED NOT DETECTED Final   Pseudomonas aeruginosa NOT DETECTED NOT DETECTED Final   Candida albicans NOT DETECTED NOT DETECTED Final   Candida glabrata NOT DETECTED NOT DETECTED Final   Candida krusei NOT DETECTED NOT DETECTED Final   Candida parapsilosis NOT DETECTED NOT DETECTED Final   Candida tropicalis NOT DETECTED NOT DETECTED Final  Culture, blood (routine x 2)     Status: None (Preliminary result)   Collection Time: 10/19/17 10:07 AM  Result Value Ref Range Status   Specimen Description BLOOD LEFT ANTECUBITAL  Final   Special Requests   Final    BOTTLES DRAWN AEROBIC AND ANAEROBIC Blood Culture adequate volume Performed at [facility], [address] [place], [place] [address]    Culture  Setup Time   Final    GRAM POSITIVE COCCI IN BOTH AEROBIC AND ANAEROBIC BOTTLES CRITICAL RESULT CALLED TO, READ BACK BY AND VERIFIED WITH: B GREEN PHARMD 10/20/17 0405 JDW    Culture GRAM POSITIVE COCCI  Final   Report Status PENDING  Incomplete     Studies: Dg Swallowing Func-speech Pathology  Result Date: 10/19/2017 Objective Swallowing Evaluation: Type of Study: MBS-Modified Barium Swallow Study  Patient Details Name: RORY MONTEL MRN: [id number] Date of Birth: 04-Sep-1950 Today's Date: 10/19/2017  Time: SLP Start Time (ACUTE ONLY): 0850 -SLP Stop Time (ACUTE ONLY): 0930 SLP Time Calculation (min) (ACUTE ONLY): 40 min Past Medical History: Past Medical History: Diagnosis Date . Alcoholism (HCC)  . Complication of anesthesia   hard time waking up . CP (cerebral palsy) (HCC)  . Depression  . Hypertension  . PONV (postoperative nausea and vomiting)  . Seizures (HCC)   ETOH induced Past Surgical  History: Past Surgical History: Procedure Laterality Date . BLADDER SURGERY   . BREAST SURGERY   . TUBAL LIGATION   HPI: 67 yo female adm to [facility] with AMS - required intubation for hypovolemic shock secondary to AKI.  Pt with coffee ground emesis, hoarse, sore throat and h/o dysphagia s/p ACDF 10 years ago with dysphagia following.   MBS indicated due to premorbid dysphagia, intubation-dysphonia and concern for pna.   Subjective: pt awake in chair Assessment / Plan / Recommendation CHL IP CLINICAL IMPRESSIONS 10/19/2017 Clinical Impression Patient presents with minimal oropharyngeal dysphagia - No aspiration observed.  Minimal oral residuals noted but pt orally transits and clears.  Trace laryngeal penetration of thin and nectar *flash* noted due to decreased timing of laryngeal closure.  Pt taxed with sequential straw swallows of thin and still did not aspirate.  Barium tablet given with thin momentarily lodged at vallecular region but cleared with furhter liquid boluses.  For maximal safety, recommend pt continue pills with applesauce - follow with liquids.  Pt agreeable to plan.  Pt's dysphagia symptoms were not redemonstrated during MBS but she did cough intermittently without barium visualized in larynx.  Recommend regular/thin diet with aspiration precautions.  Will follow up for dysphagia management, thanks for this order.  SLP Visit Diagnosis Dysphagia, oropharyngeal phase (R13.12) Attention and concentration deficit following -- Frontal lobe and executive function deficit following -- Impact on safety and function  Mild aspiration risk   CHL IP TREATMENT RECOMMENDATION 10/19/2017 Treatment Recommendations Therapy as outlined in treatment plan below   Prognosis 10/19/2017 Prognosis for Safe Diet Advancement Good Barriers to Reach Goals -- Barriers/Prognosis Comment -- CHL IP DIET RECOMMENDATION 10/19/2017 SLP Diet Recommendations Regular solids;Thin liquid Liquid Administration via Cup;Straw Medication Administration Whole meds with puree Compensations Minimize environmental distractions;Slow rate;Small sips/bites;Other (Comment) Postural Changes Remain semi-upright after after feeds/meals (Comment);Seated upright at 90 degrees   CHL IP OTHER RECOMMENDATIONS 10/19/2017 Recommended Consults -- Oral Care Recommendations Oral care BID Other Recommendations --   CHL IP FOLLOW UP RECOMMENDATIONS 10/19/2017 Follow up Recommendations (No Data)   CHL IP FREQUENCY AND DURATION 10/19/2017 Speech Therapy Frequency (ACUTE ONLY) min 1 x/week Treatment Duration 1 week      CHL IP ORAL PHASE 10/19/2017 Oral Phase WFL Oral - Pudding Teaspoon -- Oral - Pudding Cup -- Oral - Honey Teaspoon -- Oral - Honey Cup -- Oral - Nectar Teaspoon -- Oral - Nectar Cup WFL Oral - Nectar Straw WFL Oral - Thin Teaspoon WFL Oral - Thin Cup WFL Oral - Thin Straw WFL Oral - Puree WFL Oral - Mech Soft -- Oral - Regular Lingual/palatal residue;Premature spillage;Other (Comment) Oral - Multi-Consistency -- Oral - Pill WFL Oral Phase - Comment --  CHL IP PHARYNGEAL PHASE 10/19/2017 Pharyngeal Phase Impaired Pharyngeal- Pudding Teaspoon -- Pharyngeal -- Pharyngeal- Pudding Cup -- Pharyngeal -- Pharyngeal- Honey Teaspoon -- Pharyngeal -- Pharyngeal- Honey Cup -- Pharyngeal -- Pharyngeal- Nectar Teaspoon -- Pharyngeal -- Pharyngeal- Nectar Cup -- Pharyngeal -- Pharyngeal- Nectar Straw Penetration/Aspiration during swallow Pharyngeal Material enters airway, remains ABOVE vocal cords then ejected out Pharyngeal- Thin Teaspoon WFL Pharyngeal -- Pharyngeal- Thin Cup -- Pharyngeal --  Pharyngeal- Thin Straw Reduced airway/laryngeal closure Pharyngeal Material enters airway, remains ABOVE vocal cords then ejected out Pharyngeal- Puree WFL Pharyngeal -- Pharyngeal- Mechanical Soft -- Pharyngeal -- Pharyngeal- Regular WFL Pharyngeal -- Pharyngeal- Multi-consistency -- Pharyngeal -- Pharyngeal- Pill Pharyngeal residue - valleculae Pharyngeal -- Pharyngeal Comment --  CHL IP CERVICAL ESOPHAGEAL PHASE 10/19/2017 Cervical Esophageal Phase  WFL Pudding Teaspoon -- Pudding Cup -- Honey Teaspoon -- Honey Cup -- Nectar Teaspoon -- Nectar Cup -- Nectar Straw -- Thin Teaspoon -- Thin Cup -- Thin Straw -- Puree -- Mechanical Soft -- Regular -- Multi-consistency -- Pill -- Cervical Esophageal Comment minimal residuals appear at distal esophagus - pt did not report symptoms of stasis  No flowsheet data found. Chales Abrahams 10/19/2017, 9:49 AM  Donavan Burnet, MS [facility] SLP [phone]              Scheduled Meds: . acetaminophen  650 mg Oral Once  . Chlorhexidine Gluconate Cloth  6 each Topical Daily  . diphenhydrAMINE  25 mg Oral Once  . folic acid  1 mg Oral Daily  . guaiFENesin  600 mg Oral BID  . ipratropium-albuterol  3 mL Nebulization BID  . ipratropium-albuterol  3 mL Nebulization Once  . mouth rinse  15 mL Mouth Rinse BID  . multivitamin with minerals  1 tablet Oral Daily  . pantoprazole  40 mg Oral Daily  . senna-docusate  1 tablet Oral BID  . sodium chloride flush  10-40 mL Intracatheter Q12H    Continuous Infusions: . sodium chloride    .  ceFAZolin (ANCEF) IV Stopped (10/20/17 0602)  . magnesium sulfate 1 - 4 g bolus IVPB    . potassium chloride 10 mEq (10/20/17 0744)     Time spent: I have personally reviewed and interpreted on  10/20/2017 daily labs, tele strips, imagings as discussed above under date review session and assessment and plans.  I reviewed all nursing notes, pharmacy notes, consultant notes,  vitals, pertinent old records  I have discussed plan of  care as described above with RN , patient  on 10/20/2017   Albertine Grates MD, PhD  [facility] Pager [phone]. If 7PM-7AM, please contact night-coverage at [email or web address], password [facility] 10/20/2017, 8:25 AM  LOS: 7 days

## 2017-10-20 NOTE — Progress Notes (Signed)
PHARMACY - PHYSICIAN COMMUNICATION CRITICAL VALUE ALERT - BLOOD CULTURE IDENTIFICATION (BCID)  Ashley BowensDarlene S Bradley is an 67 y.o. female who [facility]/2017 with a chief complaint of altered mental status and tremors.   Assessment:  Hypovolemic shock with AKI. (include suspected source if known) 3 of 4 bottle + MSSA Name of physician (or Provider) Contacted: Pixie CasinoJ Kim.   Current antibiotics: Unasyn 3 Gm IV q12h  Changes to prescribed antibiotics recommended:  Recommendations accepted by provider  Change to Ancef 2 Gm IV q8h  Results for orders placed or performed during the hospital encounter of 10/13/17  Blood Culture ID Panel (Reflexed) (Collected: 10/19/2017 10:02 AM)  Result Value Ref Range   Enterococcus species NOT DETECTED NOT DETECTED   Listeria monocytogenes NOT DETECTED NOT DETECTED   Staphylococcus species DETECTED (A) NOT DETECTED   Staphylococcus aureus DETECTED (A) NOT DETECTED   Methicillin resistance NOT DETECTED NOT DETECTED   Streptococcus species NOT DETECTED NOT DETECTED   Streptococcus agalactiae NOT DETECTED NOT DETECTED   Streptococcus pneumoniae NOT DETECTED NOT DETECTED   Streptococcus pyogenes NOT DETECTED NOT DETECTED   Acinetobacter baumannii NOT DETECTED NOT DETECTED   Enterobacteriaceae species NOT DETECTED NOT DETECTED   Enterobacter cloacae complex NOT DETECTED NOT DETECTED   Escherichia coli NOT DETECTED NOT DETECTED   Klebsiella oxytoca NOT DETECTED NOT DETECTED   Klebsiella pneumoniae NOT DETECTED NOT DETECTED   Proteus species NOT DETECTED NOT DETECTED   Serratia marcescens NOT DETECTED NOT DETECTED   Haemophilus influenzae NOT DETECTED NOT DETECTED   Neisseria meningitidis NOT DETECTED NOT DETECTED   Pseudomonas aeruginosa NOT DETECTED NOT DETECTED   Candida albicans NOT DETECTED NOT DETECTED   Candida glabrata NOT DETECTED NOT DETECTED   Candida krusei NOT DETECTED NOT DETECTED   Candida parapsilosis NOT DETECTED NOT DETECTED    Candida tropicalis NOT DETECTED NOT DETECTED    Lorenza EvangelistGreen, Deya Bigos R 10/20/2017  4:16 AM

## 2017-10-20 NOTE — Consult Note (Signed)
[facility]       Reason for Consult: MSSA bacteremia    Referring Physician: CHAMP autoconsult  Active Problems:   AKI (acute kidney injury) (HCC)   High anion gap metabolic acidosis   Acute metabolic encephalopathy   . Chlorhexidine Gluconate Cloth  6 each Topical Daily  . folic acid  1 mg Oral Daily  . guaiFENesin  600 mg Oral BID  . ipratropium-albuterol  3 mL Nebulization BID  . ipratropium-albuterol  3 mL Nebulization Once  . mouth rinse  15 mL Mouth Rinse BID  . metroNIDAZOLE  500 mg Oral Q8H  . multivitamin with minerals  1 tablet Oral Daily  . pantoprazole  40 mg Oral Daily  . senna-docusate  1 tablet Oral BID  . sodium chloride flush  10-40 mL Intracatheter Q12H    Recommendations: Cefazolin Limited echo Repeat blood cultures tomorrow  Routine HIV Continue flagyl 3 days  Assessment: She came in with hypovolemic shock with AKI and resuscitated and after 6 days in the hospitali developed a fever and blood cultures now with MSSA.  Initial blood cultures were negative after 5 days, her WBC was not elevated and she had no fever so appears to be new.  It could presumably been part of her initial presentation but difficult to determine.  Had an echo on admission without concerns.   Pneumonia - possible aspiration based on CT.  Flagyl added. Would treat for 3 days.    Dr. Ninetta Lights available over the weekend if needed, otherwise I will follow up on Monday.    Antibiotics: unasyn  HPI: Ashley Bradley is a 67 y.o. female with alcoholism, depression, HTN, alcohol-related seizures who came in 4/12 with AMS.  She required intubation for airway protection and extubated 4/12 who developed a fever 4/17 and 2/2 blood cultures with GPC and BCID with MSSA.  She mainly complains of her throat is irritated, dry.  No current chills.  Developed hematemesis with a drop in her hemoglobin.   CT chest independently reviewed and patchy areas noted  Review  of Systems:  Constitutional: negative for chills Gastrointestinal: negative for diarrhea Musculoskeletal: negative for myalgias and arthralgias All other systems reviewed and are negative    Past Medical History:  Diagnosis Date  . Alcoholism (HCC)   . Complication of anesthesia    hard time waking up  . CP (cerebral palsy) (HCC)   . Depression   . Hypertension   . PONV (postoperative nausea and vomiting)   . Seizures (HCC)    ETOH induced    Social History   Tobacco Use  . Smoking status: Never Smoker  . Smokeless tobacco: Never Used  Substance Use Topics  . Alcohol use: Yes    Comment: 1-2 bottles wine per day  . Drug use: No    Family History  Problem Relation Age of Onset  . CAD Mother 69  . Lung cancer Father 15    Allergies  Allergen Reactions  . Macrodantin [Nitrofurantoin] Nausea And Vomiting    Physical Exam: Constitutional: in no apparent distress and alert  Vitals:   10/20/17 1200 10/20/17 1400  BP: (!) 102/45 (!) 104/55  Pulse: 96 85  Resp: (!) 31 (!) 25  Temp: 98 F (36.7 C) 97.7 F (36.5 C)  SpO2: 95% 98%   EYES: anicteric ENMT: no thrush Cardiovascular: Cor RRR Respiratory: CTA B, anterior exam; normal respiratory effort GI: Bowel sounds are normal, liver is not enlarged, spleen  is not enlarged Musculoskeletal: no pedal edema noted Skin: negatives: no rash Hematologic: no cervical lad  Lab Results  Component Value Date   WBC 10.5 10/20/2017   HGB 6.8 (LL) 10/20/2017   HCT 20.0 (L) 10/20/2017   MCV 88.9 10/20/2017   PLT 318 10/20/2017    Lab Results  Component Value Date   CREATININE 1.66 (H) 10/20/2017   BUN 31 (H) 10/20/2017   NA 136 10/20/2017   K 2.7 (LL) 10/20/2017   CL 90 (L) 10/20/2017   CO2 33 (H) 10/20/2017    Lab Results  Component Value Date   ALT 30 10/19/2017   AST 38 10/19/2017   ALKPHOS 143 (H) 10/19/2017     Microbiology: Recent Results (from the past 240 hour(s))  Culture, blood (Routine X 2) w  Reflex to ID Panel     Status: None   Collection Time: 10/13/17  5:11 AM  Result Value Ref Range Status   Specimen Description   Final    BLOOD LEFT HAND Performed at [facility], [address], [place], [place] [address]    Special Requests   Final    BOTTLES DRAWN AEROBIC AND ANAEROBIC Blood Culture adequate volume Performed at [facility], [address], [place], [place] [address]    Culture   Final    NO GROWTH 5 DAYS Performed at [facility], [address], [place], [place] [address]    Report Status 10/18/2017 FINAL  Final  Culture, blood (Routine X 2) w Reflex to ID Panel     Status: None   Collection Time: 10/13/17  5:28 AM  Result Value Ref Range Status   Specimen Description   Final    BLOOD RIGHT HAND Performed at [facility], [address] [place], [place] [address]    Special Requests   Final    BOTTLES DRAWN AEROBIC AND ANAEROBIC Blood Culture adequate volume Performed at [facility], [address], [place], [place] [address]    Culture   Final    NO GROWTH 5 DAYS Performed at [facility], [address], [place], [place] [address]    Report Status 10/18/2017 FINAL  Final  MRSA PCR Screening     Status: None   Collection Time: 10/13/17  8:46 AM  Result Value Ref Range Status   MRSA by PCR NEGATIVE NEGATIVE Final    Comment:        The GeneXpert MRSA Assay (FDA approved for NASAL specimens only), is one component of a comprehensive MRSA colonization surveillance program. It is not intended to diagnose MRSA infection nor to guide or monitor treatment for MRSA infections. Performed at [facility], [address] [place], [place] [address]   Culture, Urine     Status: Abnormal (Preliminary result)   Collection Time: 10/18/17  6:51 PM  Result Value Ref Range Status   Specimen Description   Final    URINE, RANDOM Performed at [facility], [address] [place], [place] [address]    Special Requests   Final    NONE Performed at [facility], [address], [place], [place] [address]    Culture >=100,000 COLONIES/mL GRAM NEGATIVE RODS (A)  Final   Report Status PENDING  Incomplete  Culture, blood (routine x 2)     Status: None (Preliminary result)   Collection Time: 10/19/17 10:02 AM  Result Value Ref Range Status  Specimen Description BLOOD RIGHT ANTECUBITAL  Final   Special Requests   Final    BOTTLES DRAWN AEROBIC AND ANAEROBIC Blood Culture adequate volume Performed at [facility], [address] [place], [place] [address]    Culture  Setup Time   Final    GRAM POSITIVE COCCI IN BOTH AEROBIC AND ANAEROBIC BOTTLES Organism ID to follow CRITICAL RESULT CALLED TO, READ BACK BY AND VERIFIED WITH: B GREEN PHARMD 10/20/17 0405 JDW    Culture GRAM POSITIVE COCCI  Final   Report Status PENDING  Incomplete  Blood Culture ID Panel (Reflexed)     Status: Abnormal   Collection Time: 10/19/17 10:02 AM  Result Value Ref Range Status   Enterococcus species NOT DETECTED NOT DETECTED Final   Listeria monocytogenes NOT DETECTED NOT DETECTED Final   Staphylococcus species DETECTED (A) NOT DETECTED Final    Comment: CRITICAL RESULT CALLED TO, READ BACK BY AND VERIFIED WITH: B GREEN PHARMD 10/20/17 0405 JDW    Staphylococcus aureus DETECTED (A) NOT DETECTED Final    Comment: Methicillin (oxacillin) susceptible Staphylococcus aureus (MSSA). Preferred therapy is anti staphylococcal beta lactam antibiotic (Cefazolin or Nafcillin), unless clinically contraindicated. CRITICAL RESULT CALLED TO, READ BACK BY AND VERIFIED WITH: B GREEN PHARMD 10/20/17 0405 JDW    Methicillin resistance NOT DETECTED NOT DETECTED Final   Streptococcus species NOT DETECTED NOT DETECTED Final   Streptococcus agalactiae NOT DETECTED NOT DETECTED Final   Streptococcus pneumoniae NOT DETECTED NOT  DETECTED Final   Streptococcus pyogenes NOT DETECTED NOT DETECTED Final   Acinetobacter baumannii NOT DETECTED NOT DETECTED Final   Enterobacteriaceae species NOT DETECTED NOT DETECTED Final   Enterobacter cloacae complex NOT DETECTED NOT DETECTED Final   Escherichia coli NOT DETECTED NOT DETECTED Final   Klebsiella oxytoca NOT DETECTED NOT DETECTED Final   Klebsiella pneumoniae NOT DETECTED NOT DETECTED Final   Proteus species NOT DETECTED NOT DETECTED Final   Serratia marcescens NOT DETECTED NOT DETECTED Final   Haemophilus influenzae NOT DETECTED NOT DETECTED Final   Neisseria meningitidis NOT DETECTED NOT DETECTED Final   Pseudomonas aeruginosa NOT DETECTED NOT DETECTED Final   Candida albicans NOT DETECTED NOT DETECTED Final   Candida glabrata NOT DETECTED NOT DETECTED Final   Candida krusei NOT DETECTED NOT DETECTED Final   Candida parapsilosis NOT DETECTED NOT DETECTED Final   Candida tropicalis NOT DETECTED NOT DETECTED Final  Culture, blood (routine x 2)     Status: None (Preliminary result)   Collection Time: 10/19/17 10:07 AM  Result Value Ref Range Status   Specimen Description BLOOD LEFT ANTECUBITAL  Final   Special Requests   Final    BOTTLES DRAWN AEROBIC AND ANAEROBIC Blood Culture adequate volume Performed at [facility], [address] [place] [address]    Culture  Setup Time   Final    GRAM POSITIVE COCCI IN BOTH AEROBIC AND ANAEROBIC BOTTLES CRITICAL RESULT CALLED TO, READ BACK BY AND VERIFIED WITH: B GREEN PHARMD 10/20/17 0405 JDW    Culture GRAM POSITIVE COCCI  Final   Report Status PENDING  Incomplete    Gardiner Barefootobert W Comer, MD [facility] [email or web address] [phone] pager  [phone] cell 10/20/2017, 3:01 PM

## 2017-10-20 NOTE — Anesthesia Preprocedure Evaluation (Addendum)
Anesthesia Evaluation  Patient identified by MRN, date of birth, ID band Patient awake    Reviewed: Allergy & Precautions, NPO status , Patient's Chart, lab work & pertinent test results  History of Anesthesia Complications (+) PONV  Airway Mallampati: II  TM Distance: >3 FB Neck ROM: Full    Dental  (+) Poor Dentition, Chipped, Dental Advisory Given, Missing   Pulmonary shortness of breath, pneumonia (being treated for R pneumonia, on [place] O2),  Intubated 4/12 for altered mental status   breath sounds clear to auscultation       Cardiovascular hypertension, Pt. on medications (-) angina Rhythm:Regular Rate:Normal  10/13/17 ECHO: EF 55-60%, valves ok   Neuro/Psych Seizures -, Well Controlled,  Depression Cerebral palsy    GI/Hepatic (+)     substance abuse  alcohol use, UGI bleed   Endo/Other  negative endocrine ROS  Renal/GU Renal InsufficiencyRenal disease (creat 1.27)ATN resolving, K+ 3.7     Musculoskeletal   Abdominal   Peds  Hematology  (+) Blood dyscrasia (Hb 8.7), anemia ,   Anesthesia Other Findings   Reproductive/Obstetrics                           Anesthesia Physical Anesthesia Plan  ASA: III  Anesthesia Plan: MAC   Post-op Pain Management:    Induction:   PONV Risk Score and Plan: 3 and Ondansetron and Treatment may vary due to age or medical condition  Airway Management Planned: Natural Airway and Nasal Cannula  Additional Equipment:   Intra-op Plan:   Post-operative Plan:   Informed Consent: I have reviewed the patients History and Physical, chart, labs and discussed the procedure including the risks, benefits and alternatives for the proposed anesthesia with the patient or authorized representative who has indicated his/her understanding and acceptance.   Dental advisory given  Plan Discussed with: CRNA and Surgeon  Anesthesia Plan Comments: (Plan routine  monitors, MAC)       Anesthesia Quick Evaluation

## 2017-10-20 NOTE — Consult Note (Signed)
Reason for Consult:anemia self-limited hematemesis Referring Physician: Hospital team  Ashley Bradley is an 67 y.o. female.  HPI: patient seen and examined and her hospital computer chart reviewed and our office computer chart reviewed and case discussed with the hospital teamand at home prior to admission she had no upper tract symptoms and no swallowing problemsand other than some occasional diarrhea she's not seen any blood in her bowels and she did have a screening colonoscopy 11 years ago but no other obvious GI workup and other than alcoholism her family history is negative for any specific GI problems the right now she has a little bit of a sore throat probably from the ET tube but her stools are yellowish brown and she has no other specific complaints  Past Medical History:  Diagnosis Date  . Alcoholism ([place])   . Complication of anesthesia    hard time waking up  . CP (cerebral palsy) ([place])   . Depression   . Hypertension   . PONV (postoperative nausea and vomiting)   . 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 BLADDER SURGERY    . BREAST SURGERY    . TUBAL LIGATION      Family History  Problem Relation Age of Onset  . CAD Mother 72  . Lung cancer Father 14    Social History:  reports that she has never smoked. She has never used smokeless tobacco. She reports that she drinks alcohol. She reports that she does not use drugs.  Allergies:  Allergies  Allergen Reactions  . Macrodantin [Nitrofurantoin] Nausea And Vomiting    Medications: I have reviewed the patient's current medications.  Results for orders placed or performed during the hospital encounter of 10/13/17 (from the past 48 hour(s))  Urinalysis, Routine w reflex microscopic     Status: Abnormal   Collection Time: 10/18/17  6:51 PM  Result Value Ref Range   Color, Urine YELLOW YELLOW   APPearance CLEAR CLEAR   Specific Gravity, Urine 1.009 1.005 - 1.030   pH  9.0 (H) 5.0 - 8.0   Glucose, UA NEGATIVE NEGATIVE mg/dL   Hgb urine dipstick SMALL (A) NEGATIVE   Bilirubin Urine NEGATIVE NEGATIVE   Ketones, ur NEGATIVE NEGATIVE mg/dL   Protein, ur 30 (A) NEGATIVE mg/dL   Nitrite NEGATIVE NEGATIVE   Leukocytes, UA TRACE (A) NEGATIVE   RBC / HPF 0-5 0 - 5 RBC/hpf   WBC, UA 6-30 0 - 5 WBC/hpf   Bacteria, UA RARE (A) NONE SEEN   Squamous Epithelial / LPF 0-5 (A) NONE SEEN   Mucus PRESENT    Non Squamous Epithelial 0-5 (A) NONE SEEN    Comment: Performed at [facility], [place] [address] [place], [place] [address]  Procalcitonin - Baseline     Status: None   Collection Time: 10/18/17  8:40 PM  Result Value Ref Range   Procalcitonin 0.83 ng/mL    Comment:        Interpretation: PCT > 0.5 ng/mL and <= 2 ng/mL: Systemic infection (sepsis) is possible, but other conditions are known to elevate PCT as well. (NOTE)       Sepsis PCT Algorithm           Lower Respiratory Tract  Infection PCT Algorithm    ----------------------------     ----------------------------         PCT < 0.25 ng/mL                PCT < 0.10 ng/mL         Strongly encourage             Strongly discourage   discontinuation of antibiotics    initiation of antibiotics    ----------------------------     -----------------------------       PCT 0.25 - 0.50 ng/mL            PCT 0.10 - 0.25 ng/mL               OR       >80% decrease in PCT            Discourage initiation of                                            antibiotics      Encourage discontinuation           of antibiotics    ----------------------------     -----------------------------         PCT >= 0.50 ng/mL              PCT 0.26 - 0.50 ng/mL                AND       <80% decrease in PCT             Encourage initiation of                                             antibiotics       Encourage continuation           of antibiotics     ----------------------------     -----------------------------        PCT >= 0.50 ng/mL                  PCT > 0.50 ng/mL               AND         increase in PCT                  Strongly encourage                                      initiation of antibiotics    Strongly encourage escalation           of antibiotics                                     -----------------------------                                           PCT <= 0.25 ng/mL  OR                                        > 80% decrease in PCT                                     Discontinue / Do not initiate                                             antibiotics Performed at [place] [address], [place], [place] [address]   Lactic acid, plasma     Status: None   Collection Time: 10/18/17  8:40 PM  Result Value Ref Range   Lactic Acid, Venous 0.7 0.5 - 1.9 mmol/L    Comment: Performed at [facility], [place] [address], [place], [place] [address]  CBC with Differential/Platelet     Status: Abnormal   Collection Time: 10/19/17  6:16 AM  Result Value Ref Range   WBC 12.7 (H) 4.0 - 10.5 K/uL   RBC 2.48 (L) 3.87 - 5.11 MIL/uL   Hemoglobin 7.5 (L) 12.0 - 15.0 g/dL   HCT 22.3 (L) 36.0 - 46.0 %   MCV 89.9 78.0 - 100.0 fL   MCH 30.2 26.0 - 34.0 pg   MCHC 33.6 30.0 - 36.0 g/dL   RDW 17.5 (H) 11.5 - 15.5 %   Platelets 256 150 - 400 K/uL   Neutrophils Relative % 79 %   Lymphocytes Relative 7 %   Monocytes Relative 14 %   Eosinophils Relative 0 %   Basophils Relative 0 %   Neutro Abs 10.0 (H) 1.7 - 7.7 K/uL   Lymphs Abs 0.9 0.7 - 4.0 K/uL   Monocytes Absolute 1.8 (H) 0.1 - 1.0 K/uL   Eosinophils Absolute 0.0 0.0 - 0.7 K/uL   Basophils Absolute 0.0 0.0 - 0.1 K/uL   RBC Morphology POLYCHROMASIA PRESENT     Comment: TARGET CELLS   WBC Morphology TOXIC GRANULATION     Comment: Performed at [facility], [place]  [address], [place], [place] [address]  Basic metabolic panel     Status: Abnormal   Collection Time: 10/19/17  6:16 AM  Result Value Ref Range   Sodium 137 135 - 145 mmol/L   Potassium 3.1 (L) 3.5 - 5.1 mmol/L   Chloride 88 (L) 101 - 111 mmol/L   CO2 34 (H) 22 - 32 mmol/L   Glucose, Bld 79 65 - 99 mg/dL   BUN 38 (H) 6 - 20 mg/dL   Creatinine, Ser 2.13 (H) 0.44 - 1.00 mg/dL   Calcium 8.2 (L) 8.9 - 10.3 mg/dL   GFR calc non Af Amer 23 (L) >60 mL/min   GFR calc Af Amer 26 (L) >60 mL/min    Comment: (NOTE) The eGFR has been calculated using the CKD EPI equation. This calculation has not been validated in all clinical situations. eGFR's persistently <60 mL/min signify possible Chronic Kidney Disease.    Anion gap 15 5 - 15    Comment: Performed at [facility], [place] [address] [place], [place] [address]  Lipase, blood     Status: Abnormal   Collection Time:  10/19/17  6:16 AM  Result Value Ref Range   Lipase 64 (H) 11 - 51 U/L    Comment: Performed at [facility], [place] [address] [place], [place] [address]  Hepatic function panel     Status: Abnormal   Collection Time: 10/19/17  6:16 AM  Result Value Ref Range   Total Protein 5.3 (L) 6.5 - 8.1 g/dL   Albumin 2.1 (L) 3.5 - 5.0 g/dL   AST 38 15 - 41 U/L   ALT 30 14 - 54 U/L   Alkaline Phosphatase 143 (H) 38 - 126 U/L   Total Bilirubin 1.1 0.3 - 1.2 mg/dL   Bilirubin, Direct 0.3 0.1 - 0.5 mg/dL   Indirect Bilirubin 0.8 0.3 - 0.9 mg/dL    Comment: Performed at [facility], [place] [address] [place], [place] [address]  Lactic acid, plasma     Status: None   Collection Time: 10/19/17  6:16 AM  Result Value Ref Range   Lactic Acid, Venous 0.8 0.5 - 1.9 mmol/L    Comment: Performed at [facility], [place] [address] [place], [name] [address]  Procalcitonin     Status: None   Collection Time: 10/19/17  6:16 AM  Result Value Ref Range   Procalcitonin 0.62 ng/mL     Comment:        Interpretation: PCT > 0.5 ng/mL and <= 2 ng/mL: Systemic infection (sepsis) is possible, but other conditions are known to elevate PCT as well. (NOTE)       Sepsis PCT Algorithm           Lower Respiratory Tract                                      Infection PCT Algorithm    ----------------------------     ----------------------------         PCT < 0.25 ng/mL                PCT < 0.10 ng/mL         Strongly encourage             Strongly discourage   discontinuation of antibiotics    initiation of antibiotics    ----------------------------     -----------------------------       PCT 0.25 - 0.50 ng/mL            PCT 0.10 - 0.25 ng/mL               OR       >80% decrease in PCT            Discourage initiation of                                            antibiotics      Encourage discontinuation           of antibiotics    ----------------------------     -----------------------------         PCT >= 0.50 ng/mL              PCT 0.26 - 0.50 ng/mL                AND       <80% decrease in PCT  Encourage initiation of                                             antibiotics       Encourage continuation           of antibiotics    ----------------------------     -----------------------------        PCT >= 0.50 ng/mL                  PCT > 0.50 ng/mL               AND         increase in PCT                  Strongly encourage                                      initiation of antibiotics    Strongly encourage escalation           of antibiotics                                     -----------------------------                                           PCT <= 0.25 ng/mL                                                 OR                                        > 80% decrease in PCT                                     Discontinue / Do not initiate                                             antibiotics Performed at [place]  [address] [place], [place] [address]   Hepatitis panel, acute     Status: None   Collection Time: 10/19/17  6:16 AM  Result Value Ref Range   Hepatitis B Surface Ag Negative Negative   HCV Ab <0.1 0.0 - 0.9 s/co ratio    Comment: (NOTE)                                  Negative:     < 0.8  Indeterminate: 0.8 - 0.9                                  Positive:     > 0.9 The CDC recommends that a positive HCV antibody result be followed up with a HCV Nucleic Acid Amplification test ([id number]). Performed At: [facility] [place], [place] [id number] Rush Farmer MD [id number]    Hep A IgM Negative Negative   Hep B C IgM Negative Negative    Comment: Performed at [facility], [place] [address] [place], [place] [address]  Culture, blood (routine x 2)     Status: None (Preliminary result)   Collection Time: 10/19/17 10:02 AM  Result Value Ref Range   Specimen Description BLOOD RIGHT ANTECUBITAL    Special Requests      BOTTLES DRAWN AEROBIC AND ANAEROBIC Blood Culture adequate volume Performed at [facility] [address], [place], [place] [address]    Culture  Setup Time      GRAM POSITIVE COCCI IN BOTH AEROBIC AND ANAEROBIC BOTTLES Organism ID to follow CRITICAL RESULT CALLED TO, READ BACK BY AND VERIFIED WITH: B GREEN PHARMD 10/20/17 0405 JDW    Culture GRAM POSITIVE COCCI    Report Status PENDING   Blood Culture ID Panel (Reflexed)     Status: Abnormal   Collection Time: 10/19/17 10:02 AM  Result Value Ref Range   Enterococcus species NOT DETECTED NOT DETECTED   Listeria monocytogenes NOT DETECTED NOT DETECTED   Staphylococcus species DETECTED (A) NOT DETECTED    Comment: CRITICAL RESULT CALLED TO, READ BACK BY AND VERIFIED WITH: B GREEN PHARMD 10/20/17 0405 JDW    Staphylococcus aureus DETECTED (A) NOT DETECTED    Comment: Methicillin (oxacillin) susceptible Staphylococcus aureus  (MSSA). Preferred therapy is anti staphylococcal beta lactam antibiotic (Cefazolin or Nafcillin), unless clinically contraindicated. CRITICAL RESULT CALLED TO, READ BACK BY AND VERIFIED WITH: B GREEN PHARMD 10/20/17 0405 JDW    Methicillin resistance NOT DETECTED NOT DETECTED   Streptococcus species NOT DETECTED NOT DETECTED   Streptococcus agalactiae NOT DETECTED NOT DETECTED   Streptococcus pneumoniae NOT DETECTED NOT DETECTED   Streptococcus pyogenes NOT DETECTED NOT DETECTED   Acinetobacter baumannii NOT DETECTED NOT DETECTED   Enterobacteriaceae species NOT DETECTED NOT DETECTED   Enterobacter cloacae complex NOT DETECTED NOT DETECTED   Escherichia coli NOT DETECTED NOT DETECTED   Klebsiella oxytoca NOT DETECTED NOT DETECTED   Klebsiella pneumoniae NOT DETECTED NOT DETECTED   Proteus species NOT DETECTED NOT DETECTED   Serratia marcescens NOT DETECTED NOT DETECTED   Haemophilus influenzae NOT DETECTED NOT DETECTED   Neisseria meningitidis NOT DETECTED NOT DETECTED   Pseudomonas aeruginosa NOT DETECTED NOT DETECTED   Candida albicans NOT DETECTED NOT DETECTED   Candida glabrata NOT DETECTED NOT DETECTED   Candida krusei NOT DETECTED NOT DETECTED   Candida parapsilosis NOT DETECTED NOT DETECTED   Candida tropicalis NOT DETECTED NOT DETECTED  Culture, blood (routine x 2)     Status: None (Preliminary result)   Collection Time: 10/19/17 10:07 AM  Result Value Ref Range   Specimen Description BLOOD LEFT ANTECUBITAL    Special Requests      BOTTLES DRAWN AEROBIC AND ANAEROBIC Blood Culture adequate volume Performed at [facility], [place] [address] [place], [place] [address]    Culture  Setup Time      [facility] POSITIVE COCCI  IN BOTH AEROBIC AND ANAEROBIC BOTTLES CRITICAL RESULT CALLED TO, READ BACK BY AND VERIFIED WITH: B GREEN PHARMD 10/20/17 0405 JDW    Culture GRAM POSITIVE COCCI    Report Status PENDING   CBC with Differential/Platelet     Status: Abnormal    Collection Time: 10/20/17  3:53 AM  Result Value Ref Range   WBC 10.5 4.0 - 10.5 K/uL   RBC 2.25 (L) 3.87 - 5.11 MIL/uL   Hemoglobin 6.8 (LL) 12.0 - 15.0 g/dL    Comment: REPEATED TO VERIFY CRITICAL RESULT CALLED TO, READ BACK BY AND VERIFIED WITH: K WILLING RN [id number] 10/20/17 A NAVARRO    HCT 20.0 (L) 36.0 - 46.0 %   MCV 88.9 78.0 - 100.0 fL   MCH 30.2 26.0 - 34.0 pg   MCHC 34.0 30.0 - 36.0 g/dL   RDW 17.4 (H) 11.5 - 15.5 %   Platelets 318 150 - 400 K/uL   Neutrophils Relative % 82 %   Neutro Abs 8.5 (H) 1.7 - 7.7 K/uL   Lymphocytes Relative 7 %   Lymphs Abs 0.7 0.7 - 4.0 K/uL   Monocytes Relative 11 %   Monocytes Absolute 1.2 (H) 0.1 - 1.0 K/uL   Eosinophils Relative 0 %   Eosinophils Absolute 0.0 0.0 - 0.7 K/uL   Basophils Relative 0 %   Basophils Absolute 0.0 0.0 - 0.1 K/uL    Comment: Performed at [facility], [place] [address] [place], [place] [address]  Procalcitonin     Status: None   Collection Time: 10/20/17  3:53 AM  Result Value Ref Range   Procalcitonin 0.67 ng/mL    Comment:        Interpretation: PCT > 0.5 ng/mL and <= 2 ng/mL: Systemic infection (sepsis) is possible, but other conditions are known to elevate PCT as well. (NOTE)       Sepsis PCT Algorithm           Lower Respiratory Tract                                      Infection PCT Algorithm    ----------------------------     ----------------------------         PCT < 0.25 ng/mL                PCT < 0.10 ng/mL         Strongly encourage             Strongly discourage   discontinuation of antibiotics    initiation of antibiotics    ----------------------------     -----------------------------       PCT 0.25 - 0.50 ng/mL            PCT 0.10 - 0.25 ng/mL               OR       >80% decrease in PCT            Discourage initiation of                                            antibiotics      Encourage discontinuation           of antibiotics    ----------------------------      -----------------------------  PCT >= 0.50 ng/mL              PCT 0.26 - 0.50 ng/mL                AND       <80% decrease in PCT             Encourage initiation of                                             antibiotics       Encourage continuation           of antibiotics    ----------------------------     -----------------------------        PCT >= 0.50 ng/mL                  PCT > 0.50 ng/mL               AND         increase in PCT                  Strongly encourage                                      initiation of antibiotics    Strongly encourage escalation           of antibiotics                                     -----------------------------                                           PCT <= 0.25 ng/mL                                                 OR                                        > 80% decrease in PCT                                     Discontinue / Do not initiate                                             antibiotics Performed at [place] [address] [place], [place] [address]   Basic metabolic panel     Status: Abnormal   Collection Time: 10/20/17  3:53 AM  Result Value Ref Range   Sodium 136 135 - 145 mmol/L   Potassium 2.7 (LL) 3.5 - 5.1 mmol/L    Comment: CRITICAL RESULT CALLED TO, READ BACK BY AND  VERIFIED WITH: K Durenda Guthrie _0  10/20/17 MKELLY    Chloride 90 (L) 101 - 111 mmol/L   CO2 33 (H) 22 - 32 mmol/L   Glucose, Bld 112 (H) 65 - 99 mg/dL   BUN 31 (H) 6 - 20 mg/dL   Creatinine, Ser 1.66 (H) 0.44 - 1.00 mg/dL   Calcium 7.4 (L) 8.9 - 10.3 mg/dL   GFR calc non Af Amer 31 (L) >60 mL/min   GFR calc Af Amer 36 (L) >60 mL/min    Comment: (NOTE) The eGFR has been calculated using the CKD EPI equation. This calculation has not been validated in all clinical situations. eGFR's persistently <60 mL/min signify possible Chronic Kidney Disease.    Anion gap 13 5 - 15    Comment: Performed at [facility], [place] [address], [place], [place] [address]  Lipase, blood     Status: Abnormal   Collection Time: 10/20/17  3:53 AM  Result Value Ref Range   Lipase 69 (H) 11 - 51 U/L    Comment: Performed at [facility], [place] [address] [place], [place] [address]  Magnesium     Status: Abnormal   Collection Time: 10/20/17  3:53 AM  Result Value Ref Range   Magnesium 1.2 (L) 1.7 - 2.4 mg/dL    Comment: Performed at [facility], [place] [address], [place], [place] [address]  Reticulocytes     Status: Abnormal   Collection Time: 10/20/17  3:53 AM  Result Value Ref Range   Retic Ct Pct 3.7 (H) 0.4 - 3.1 %   RBC. 2.25 (L) 3.87 - 5.11 MIL/uL   Retic Count, Absolute 83.3 19.0 - 186.0 K/uL    Comment: Performed at [facility], [place] [address], [place], [place] [address]  Iron and TIBC     Status: Abnormal   Collection Time: 10/20/17  3:53 AM  Result Value Ref Range   Iron 12 (L) 28 - 170 ug/dL   TIBC 134 (L) 250 - 450 ug/dL   Saturation Ratios 9 (L) 10.4 - 31.8 %   UIBC 122 ug/dL    Comment: Performed at [facility], [place] [address] [place], [place] [address]  Vitamin B12     Status: Abnormal   Collection Time: 10/20/17  3:53 AM  Result Value Ref Range   Vitamin B-12 1,881 (H) 180 - 914 pg/mL    Comment: (NOTE) This assay is not validated for testing neonatal or myeloproliferative syndrome specimens for Vitamin B12 levels. Performed at [facility], [place] [address], [place], [place] [address]   Folate     Status: None   Collection Time: 10/20/17  3:53 AM  Result Value Ref Range   Folate 26.3 >5.9 ng/mL    Comment: Performed at [place] [address], [place], [place] [address]  TSH     Status: None   Collection Time: 10/20/17  3:53 AM  Result Value Ref Range   TSH 2.347 0.350 - 4.500 uIU/mL    Comment: Performed by a 3rd Generation assay with a functional sensitivity of <=0.01 uIU/mL. Performed  at [facility], [place] [address] [place],  [address]   Type and screen [place]     Status: None (Preliminary result)   Collection Time: 10/20/17  5:55 AM  Result Value Ref Range   ABO/RH(D) O POS    Antibody Screen NEG    Sample Expiration 10/23/2017  Unit Number [id number]    Blood Component Type RED CELLS,LR    Unit division 00    Status of Unit ISSUED    Transfusion Status OK TO TRANSFUSE    Crossmatch Result      Compatible Performed at [place] [address], [place], [place] [address]   Prepare RBC     Status: None   Collection Time: 10/20/17  5:55 AM  Result Value Ref Range   Order Confirmation      ORDER PROCESSED BY BLOOD BANK Performed at [facility], [place] [address] [place], [place] [address]     Ct Chest Wo Contrast  Result Date: 10/18/2017 CLINICAL DATA:  Cough, acute kidney failure. History of bladder surgery and urinary tract infections. EXAM: CT CHEST, ABDOMEN AND PELVIS WITHOUT CONTRAST TECHNIQUE: Multidetector CT imaging of the chest, abdomen and pelvis was performed following the standard protocol without IV contrast. COMPARISON:  Chest radiograph October 16, 2017, abdominal radiograph October 13, 2017 and CT abdomen and pelvis January 16, 2016 FINDINGS: CT CHEST FINDINGS CARDIOVASCULAR: Heart and pericardium are unremarkable. Thoracic aorta is normal course and caliber, unremarkable. MEDIASTINUM/NODES: No mediastinal mass. No lymphadenopathy by CT size criteria limited assessment without contrast. Normal appearance of thoracic esophagus though not tailored for evaluation. RIGHT internal jugular central venous catheter terminates in mid superior vena cava. LUNGS/PLEURA: Tracheobronchial tree is patent, no pneumothorax. Prominent vascular structures corresponding to hilar prominence on prior radiograph. Small RIGHT and trace LEFT pleural effusion. Scattered ground-glass opacities  including centrilobular ground-glass nodules and patchy consolidation most conspicuous in RIGHT lower lobe with air bronchograms. MUSCULOSKELETAL: Nonacute. Calcified bilateral breast implants. Elevated RIGHT hemidiaphragm. ACDF. Scattered Schmorl's nodes. CT ABDOMEN PELVIS FINDINGS HEPATOBILIARY: Diffusely markedly hypodense liver. Similarly dense gallbladder without CT findings of acute cholecystitis. PANCREAS: Normal.  Very slight peripancreatic fat stranding. SPLEEN: Normal. ADRENALS/URINARY TRACT: Kidneys are orthotopic, demonstrating normal size and morphology. Mild nonspecific RIGHT perinephric fat stranding. No nephrolithiasis, hydronephrosis; limited assessment for renal masses on this nonenhanced examination. The unopacified ureters are normal in course and caliber. Urinary bladder is well distended with nondependent gas and mild perivesicular fat stranding. Normal adrenal glands. STOMACH/BOWEL: The stomach, small and large bowel are normal in course and caliber without inflammatory changes, sensitivity decreased by lack of enteric contrast. Normal appendix. VASCULAR/LYMPHATIC: Aortoiliac vessels are normal in course and caliber. Trace calcific atherosclerosis. No lymphadenopathy by CT size criteria. REPRODUCTIVE: Normal. OTHER: No intraperitoneal free fluid or free air. MUSCULOSKELETAL: Non-acute. Chronic deformity RIGHT iliac wing. Small bilateral hip effusions. Minimal grade 1 L3-4 anterolisthesis, multilevel moderate to severe facet arthropathy. Moderate to severe L5-S1 degenerative disc. IMPRESSION: CT CHEST: 1. Scattered ground-glass opacities and dense consolidations concerning for pneumonia, less likely pulmonary edema. Small RIGHT and trace LEFT pleural effusions. CT ABDOMEN AND PELVIS: 1. Mild urinary bladder wall thickening with gas concerning for cystitis, recommend correlation with recent instrumentation. 2. Marked hepatic steatosis. 3. Slight peripancreatic fat stranding, potential  pancreatitis. Recommend correlation with amylase and lipase. Aortic Atherosclerosis (ICD10-I70.0). Electronically Signed   By: Elon Alas M.D.   On: 10/18/2017 17:53   Dg Swallowing Func-speech Pathology  Result Date: 10/19/2017 Objective Swallowing Evaluation: Type of Study: MBS-Modified Barium Swallow Study  Patient Details Name: Ashley Bradley MRN: [id number] Date of Birth: 1950/11/08 Today's Date: 10/19/2017 Time: SLP Start Time (ACUTE ONLY): 0850 -SLP Stop Time (ACUTE ONLY): 0930 SLP Time Calculation (min) (ACUTE ONLY): 40 min Past Medical History: Past Medical History: Diagnosis Date . Alcoholism ([place])  .  Complication of anesthesia   hard time waking up . CP (cerebral palsy) ([place])  . Depression  . Hypertension  . PONV (postoperative nausea and vomiting)  . 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 BLADDER SURGERY   . BREAST SURGERY   . TUBAL LIGATION   HPI: 67 yo female adm to [facility] with AMS - required intubation for hypovolemic shock secondary to AKI.  Pt with coffee ground emesis, hoarse, sore throat and h/o dysphagia s/p ACDF 10 years ago with dysphagia following.   MBS indicated due to premorbid dysphagia, intubation-dysphonia and concern for pna.   Subjective: pt awake in chair Assessment / Plan / Recommendation CHL IP CLINICAL IMPRESSIONS 10/19/2017 Clinical Impression Patient presents with minimal oropharyngeal dysphagia - No aspiration observed.  Minimal oral residuals noted but pt orally transits and clears.  Trace laryngeal penetration of thin and nectar *flash* noted due to decreased timing of laryngeal closure.  Pt taxed with sequential straw swallows of thin and still did not aspirate.  Barium tablet given with thin momentarily lodged at vallecular region but cleared with furhter liquid boluses.  For maximal safety, recommend pt continue pills with applesauce - follow with liquids.  Pt agreeable to plan.  Pt's dysphagia symptoms  were not redemonstrated during MBS but she did cough intermittently without barium visualized in larynx.  Recommend regular/thin diet with aspiration precautions.  Will follow up for dysphagia management, thanks for this order.  SLP Visit Diagnosis Dysphagia, oropharyngeal phase (R13.12) Attention and concentration deficit following -- Frontal lobe and executive function deficit following -- Impact on safety and function Mild aspiration risk   CHL IP TREATMENT RECOMMENDATION 10/19/2017 Treatment Recommendations Therapy as outlined in treatment plan below   Prognosis 10/19/2017 Prognosis for Safe Diet Advancement Good Barriers to Reach Goals -- Barriers/Prognosis Comment -- [facility]L IP DIET RECOMMENDATION 10/19/2017 SLP Diet Recommendations Regular solids;Thin liquid Liquid Administration via Cup;Straw Medication Administration Whole meds with puree Compensations Minimize environmental distractions;Slow rate;Small sips/bites;Other (Comment) Postural Changes Remain semi-upright after after feeds/meals (Comment);Seated upright at 90 degrees   CHL IP OTHER RECOMMENDATIONS 10/19/2017 Recommended Consults -- Oral Care Recommendations Oral care BID Other Recommendations --   [facility] IP FOLLOW UP RECOMMENDATIONS 10/19/2017 Follow up Recommendations (No Data)   CHL IP FREQUENCY AND DURATION 10/19/2017 Speech Therapy Frequency (ACUTE ONLY) min 1 x/week Treatment Duration 1 week      CHL IP ORAL PHASE 10/19/2017 Oral Phase WFL Oral - Pudding Teaspoon -- Oral - Pudding Cup -- Oral - Honey Teaspoon -- Oral - Honey Cup -- Oral - Nectar Teaspoon -- Oral - Nectar Cup WFL Oral - Nectar Straw WFL Oral - Thin Teaspoon WFL Oral - Thin Cup WFL Oral - Thin Straw WFL Oral - Puree WFL Oral - Mech Soft -- Oral - Regular Lingual/palatal residue;Premature spillage;Other (Comment) Oral - Multi-Consistency -- Oral - Pill WFL Oral Phase - Comment --  CHL IP PHARYNGEAL PHASE 10/19/2017 Pharyngeal Phase Impaired Pharyngeal- Pudding Teaspoon -- Pharyngeal --  Pharyngeal- Pudding Cup -- Pharyngeal -- Pharyngeal- Honey Teaspoon -- Pharyngeal -- Pharyngeal- Honey Cup -- Pharyngeal -- Pharyngeal- Nectar Teaspoon -- Pharyngeal -- Pharyngeal- Nectar Cup -- Pharyngeal -- Pharyngeal- Nectar Straw Penetration/Aspiration during swallow Pharyngeal Material enters airway, remains ABOVE vocal cords then ejected out Pharyngeal- Thin Teaspoon WFL Pharyngeal -- Pharyngeal- Thin Cup -- Pharyngeal -- Pharyngeal- Thin Straw Reduced airway/laryngeal closure Pharyngeal Material enters airway, remains ABOVE vocal cords then ejected out Pharyngeal- Puree WFL Pharyngeal --  Pharyngeal- Mechanical Soft -- Pharyngeal -- Pharyngeal- Regular WFL Pharyngeal -- Pharyngeal- Multi-consistency -- Pharyngeal -- Pharyngeal- Pill Pharyngeal residue - valleculae Pharyngeal -- Pharyngeal Comment --  CHL IP CERVICAL ESOPHAGEAL PHASE 10/19/2017 Cervical Esophageal Phase WFL Pudding Teaspoon -- Pudding Cup -- Honey Teaspoon -- Honey Cup -- Nectar Teaspoon -- Nectar Cup -- Nectar Straw -- Thin Teaspoon -- Thin Cup -- Thin Straw -- Puree -- Mechanical Soft -- Regular -- Multi-consistency -- Pill -- Cervical Esophageal Comment minimal residuals appear at distal esophagus - pt did not report symptoms of stasis  No flowsheet data found. Macario Golds 10/19/2017, 9:49 AM  Luanna Salk, [facility] SLP [phone]             Ct Renal Stone Study  Result Date: 10/18/2017 CLINICAL DATA:  Cough, acute kidney failure. History of bladder surgery and urinary tract infections. EXAM: CT CHEST, ABDOMEN AND PELVIS WITHOUT CONTRAST TECHNIQUE: Multidetector CT imaging of the chest, abdomen and pelvis was performed following the standard protocol without IV contrast. COMPARISON:  Chest radiograph October 16, 2017, abdominal radiograph October 13, 2017 and CT abdomen and pelvis January 16, 2016 FINDINGS: CT CHEST FINDINGS CARDIOVASCULAR: Heart and pericardium are unremarkable. Thoracic aorta is normal course and caliber, unremarkable.  MEDIASTINUM/NODES: No mediastinal mass. No lymphadenopathy by CT size criteria limited assessment without contrast. Normal appearance of thoracic esophagus though not tailored for evaluation. RIGHT internal jugular central venous catheter terminates in mid superior vena cava. LUNGS/PLEURA: Tracheobronchial tree is patent, no pneumothorax. Prominent vascular structures corresponding to hilar prominence on prior radiograph. Small RIGHT and trace LEFT pleural effusion. Scattered ground-glass opacities including centrilobular ground-glass nodules and patchy consolidation most conspicuous in RIGHT lower lobe with air bronchograms. MUSCULOSKELETAL: Nonacute. Calcified bilateral breast implants. Elevated RIGHT hemidiaphragm. ACDF. Scattered Schmorl's nodes. CT ABDOMEN PELVIS FINDINGS HEPATOBILIARY: Diffusely markedly hypodense liver. Similarly dense gallbladder without CT findings of acute cholecystitis. PANCREAS: Normal.  Very slight peripancreatic fat stranding. SPLEEN: Normal. ADRENALS/URINARY TRACT: Kidneys are orthotopic, demonstrating normal size and morphology. Mild nonspecific RIGHT perinephric fat stranding. No nephrolithiasis, hydronephrosis; limited assessment for renal masses on this nonenhanced examination. The unopacified ureters are normal in course and caliber. Urinary bladder is well distended with nondependent gas and mild perivesicular fat stranding. Normal adrenal glands. STOMACH/BOWEL: The stomach, small and large bowel are normal in course and caliber without inflammatory changes, sensitivity decreased by lack of enteric contrast. Normal appendix. VASCULAR/LYMPHATIC: Aortoiliac vessels are normal in course and caliber. Trace calcific atherosclerosis. No lymphadenopathy by CT size criteria. REPRODUCTIVE: Normal. OTHER: No intraperitoneal free fluid or free air. MUSCULOSKELETAL: Non-acute. Chronic deformity RIGHT iliac wing. Small bilateral hip effusions. Minimal grade 1 L3-4 anterolisthesis, multilevel  moderate to severe facet arthropathy. Moderate to severe L5-S1 degenerative disc. IMPRESSION: CT CHEST: 1. Scattered ground-glass opacities and dense consolidations concerning for pneumonia, less likely pulmonary edema. Small RIGHT and trace LEFT pleural effusions. CT ABDOMEN AND PELVIS: 1. Mild urinary bladder wall thickening with gas concerning for cystitis, recommend correlation with recent instrumentation. 2. Marked hepatic steatosis. 3. Slight peripancreatic fat stranding, potential pancreatitis. Recommend correlation with amylase and lipase. Aortic Atherosclerosis (ICD10-I70.0). Electronically Signed   By: Elon Alas M.D.   On: 10/18/2017 17:53    ROS negative except above Blood pressure (!) 111/58, pulse 94, temperature 99.1 F (37.3 C), temperature source Oral, resp. rate (!) 29, height _0  (1.651 m), weight 72.4 kg (159 lb 9.8 oz), SpO2 98 %. Physical Examvital signs stable afebrile no acute distress lungs clear regular rate  and rhythm abdomen is soft nontender hemoglobin slight decrease guaiac positive on admission BUN and creatinine decreasing nicelyanemia more compatible with chronic disease  Assessment/Plan: Multiple medical problemsincluding self-limited hematemesis and anemia Plan: The risks benefits methods of endoscopy was discussed with the patient and unfortunately she is eating breakfast this morning so we will proceed tomorrow with further workup and plans pending those findings  [place] E 10/20/2017, 11:02 AM

## 2017-10-21 ENCOUNTER — Encounter (HOSPITAL_COMMUNITY): Payer: Self-pay | Admitting: Registered Nurse

## 2017-10-21 ENCOUNTER — Inpatient Hospital Stay (HOSPITAL_COMMUNITY): Payer: Medicare Other | Admitting: Anesthesiology

## 2017-10-21 ENCOUNTER — Encounter (HOSPITAL_COMMUNITY)
Admission: EM | Disposition: A | Discharge status: Home Health | Payer: Self-pay | Source: Home / Self Care | Attending: Internal Medicine

## 2017-10-21 ENCOUNTER — Inpatient Hospital Stay (HOSPITAL_COMMUNITY): Payer: Medicare Other

## 2017-10-21 DIAGNOSIS — R7881 Bacteremia: Secondary | ICD-10-CM

## 2017-10-21 HISTORY — PX: ESOPHAGOGASTRODUODENOSCOPY (EGD) WITH PROPOFOL: SHX5813

## 2017-10-21 LAB — TYPE AND SCREEN
ABO/RH(D): O POS
ANTIBODY SCREEN: NEGATIVE
UNIT DIVISION: 0

## 2017-10-21 LAB — CBC WITH DIFFERENTIAL/PLATELET
BASOS PCT: 2 %
Basophils Absolute: 0.1 10*3/uL (ref 0.0–0.1)
EOS ABS: 0 10*3/uL (ref 0.0–0.7)
EOS PCT: 1 %
HCT: 25.8 % — ABNORMAL LOW (ref 36.0–46.0)
Hemoglobin: 8.7 g/dL — ABNORMAL LOW (ref 12.0–15.0)
Lymphocytes Relative: 18 %
Lymphs Abs: 1.2 10*3/uL (ref 0.7–4.0)
MCH: 29.9 pg (ref 26.0–34.0)
MCHC: 33.7 g/dL (ref 30.0–36.0)
MCV: 88.7 fL (ref 78.0–100.0)
MONO ABS: 0.6 10*3/uL (ref 0.1–1.0)
MONOS PCT: 10 %
NEUTROS PCT: 71 %
Neutro Abs: 4.7 10*3/uL (ref 1.7–7.7)
PLATELETS: 373 10*3/uL (ref 150–400)
RBC: 2.91 MIL/uL — ABNORMAL LOW (ref 3.87–5.11)
RDW: 17.1 % — AB (ref 11.5–15.5)
WBC: 6.7 10*3/uL (ref 4.0–10.5)

## 2017-10-21 LAB — BASIC METABOLIC PANEL
Anion gap: 11 (ref 5–15)
BUN: 20 mg/dL (ref 6–20)
CALCIUM: 7.3 mg/dL — AB (ref 8.9–10.3)
CO2: 29 mmol/L (ref 22–32)
CREATININE: 1.27 mg/dL — AB (ref 0.44–1.00)
Chloride: 98 mmol/L — ABNORMAL LOW (ref 101–111)
GFR calc non Af Amer: 42 mL/min — ABNORMAL LOW (ref >60)
GFR, EST AFRICAN AMERICAN: 49 mL/min — AB (ref >60)
Glucose, Bld: 94 mg/dL (ref 65–99)
Potassium: 3.7 mmol/L (ref 3.5–5.1)
SODIUM: 138 mmol/L (ref 135–145)

## 2017-10-21 LAB — URINE CULTURE

## 2017-10-21 LAB — BPAM RBC
Blood Product Expiration Date: 201905152359
ISSUE DATE / TIME: 201904191029
UNIT TYPE AND RH: 5100

## 2017-10-21 LAB — OCCULT BLOOD X 1 CARD TO LAB, STOOL: FECAL OCCULT BLD: NEGATIVE

## 2017-10-21 LAB — LIPASE, BLOOD: LIPASE: 59 U/L — AB (ref 11–51)

## 2017-10-21 LAB — ECHOCARDIOGRAM LIMITED
HEIGHTINCHES: 65 in
WEIGHTICAEL: 2192 [oz_av]

## 2017-10-21 LAB — HIV ANTIBODY (ROUTINE TESTING W REFLEX): HIV Screen 4th Generation wRfx: NONREACTIVE

## 2017-10-21 SURGERY — ESOPHAGOGASTRODUODENOSCOPY (EGD) WITH PROPOFOL
Anesthesia: Monitor Anesthesia Care

## 2017-10-21 MED ORDER — ONDANSETRON HCL 4 MG/2ML IJ SOLN
INTRAMUSCULAR | Status: DC | PRN
  Administered 2017-10-21: 4 mg via INTRAVENOUS

## 2017-10-21 MED ORDER — MIDAZOLAM HCL 5 MG/5ML IJ SOLN
INTRAMUSCULAR | Status: DC | PRN
  Administered 2017-10-21: 1 mg via INTRAVENOUS

## 2017-10-21 MED ORDER — PROPOFOL 10 MG/ML IV BOLUS
INTRAVENOUS | Status: AC
  Filled 2017-10-21: qty 20

## 2017-10-21 MED ORDER — ONDANSETRON HCL 4 MG/2ML IJ SOLN
INTRAMUSCULAR | Status: AC
  Filled 2017-10-21: qty 2

## 2017-10-21 MED ORDER — DEXAMETHASONE SODIUM PHOSPHATE 10 MG/ML IJ SOLN
INTRAMUSCULAR | Status: DC | PRN
  Administered 2017-10-21: 5 mg via INTRAVENOUS

## 2017-10-21 MED ORDER — MEPERIDINE HCL 25 MG/ML IJ SOLN: 6.2500 mg | INTRAMUSCULAR | Status: DC | PRN

## 2017-10-21 MED ORDER — MIDAZOLAM HCL 2 MG/2ML IJ SOLN
INTRAMUSCULAR | Status: AC
Start: 2017-10-21 — End: 2017-10-21
  Filled 2017-10-21: qty 2

## 2017-10-21 MED ORDER — LIDOCAINE 2% (20 MG/ML) 5 ML SYRINGE
INTRAMUSCULAR | Status: DC | PRN
  Administered 2017-10-21: 50 mg via INTRAVENOUS

## 2017-10-21 MED ORDER — PROPOFOL 500 MG/50ML IV EMUL
INTRAVENOUS | Status: DC | PRN
  Administered 2017-10-21: 200 ug/kg/min via INTRAVENOUS

## 2017-10-21 MED ORDER — MIDAZOLAM HCL 2 MG/2ML IJ SOLN: 0.5000 mg | Freq: Once | INTRAMUSCULAR | Status: DC | PRN

## 2017-10-21 MED ORDER — PROPOFOL 10 MG/ML IV BOLUS
INTRAVENOUS | Status: AC
  Filled 2017-10-21: qty 60

## 2017-10-21 MED ORDER — MIDAZOLAM HCL 2 MG/2ML IJ SOLN
INTRAMUSCULAR | Status: AC
  Filled 2017-10-21: qty 2

## 2017-10-21 MED ORDER — FENTANYL CITRATE (PF) 250 MCG/5ML IJ SOLN
INTRAMUSCULAR | Status: AC
  Filled 2017-10-21: qty 5

## 2017-10-21 MED ORDER — LACTATED RINGERS IV SOLN
INTRAVENOUS | Status: DC | PRN
  Administered 2017-10-21: 07:00:00 via INTRAVENOUS

## 2017-10-21 MED ORDER — SODIUM CHLORIDE 0.9 % IV SOLN: INTRAVENOUS | Status: DC

## 2017-10-21 MED ORDER — ALBUTEROL SULFATE (2.5 MG/3ML) 0.083% IN NEBU
2.5000 mg | INHALATION_SOLUTION | Freq: Once | RESPIRATORY_TRACT | Status: AC
Start: 2017-10-21 — End: 2017-10-21
  Administered 2017-10-21: 2.5 mg via RESPIRATORY_TRACT

## 2017-10-21 MED ORDER — ALBUTEROL SULFATE (2.5 MG/3ML) 0.083% IN NEBU
INHALATION_SOLUTION | RESPIRATORY_TRACT | Status: AC
  Filled 2017-10-21: qty 3

## 2017-10-21 MED ORDER — LACTATED RINGERS IV SOLN
INTRAVENOUS | Status: DC
  Administered 2017-10-21: 1000 mL via INTRAVENOUS

## 2017-10-21 MED ORDER — DEXAMETHASONE SODIUM PHOSPHATE 10 MG/ML IJ SOLN
INTRAMUSCULAR | Status: AC
  Filled 2017-10-21: qty 1

## 2017-10-21 MED ORDER — PNEUMOCOCCAL VAC POLYVALENT 25 MCG/0.5ML IJ INJ
0.5000 mL | INJECTION | INTRAMUSCULAR | Status: DC
  Filled 2017-10-21: qty 0.5

## 2017-10-21 SURGICAL SUPPLY — 14 items

## 2017-10-21 NOTE — Progress Notes (Signed)
  Echocardiogram 2D Echocardiogram has been performed.  Roosvelt MaserLane, Pavel Gadd F 10/21/2017, 11:51 AM

## 2017-10-21 NOTE — Progress Notes (Signed)
Recovery in procedure room with CRNA present. Patient is awake and oriented. No complaints of pain.

## 2017-10-21 NOTE — Anesthesia Procedure Notes (Signed)
Procedure Name: MAC Date/Time: 10/21/2017 7:46 AM Performed by: Lissa Morales, CRNA Pre-anesthesia Checklist: Patient identified, Emergency Drugs available, Suction available, Patient being monitored and Timeout performed Patient Re-evaluated:Patient Re-evaluated prior to induction Oxygen Delivery Method: Nasal cannula Placement Confirmation: positive ETCO2

## 2017-10-21 NOTE — Op Note (Signed)
[facility] Patient Name: Ashley Bradley Procedure Date: 10/21/2017 MRN: [id number] Attending MD: Vida RiggerMarc Jevan Gaunt , MD Date of Birth: 1950/10/15 CSN: [id number] Age: [age] Admit Type: Inpatient Procedure:                Upper GI endoscopy Indications:              Hematemesis Providers:                Vida RiggerMarc Krystian Ferrentino, MD, Priscella MannAutumn Goldsmith RN, RN, Arlee Muslimhris                            Chandler Tech., Technician, Anastasio ChampionJanet Evans, CRNA Referring MD:              Medicines:                Propofol total dose 200 mg IV, Midazolam 1 mg IV,                            Ondansetron 4 mg IV 50 mg IV lidocaine Complications:            No immediate complications. Estimated Blood Loss:     Estimated blood loss: none. Procedure:                Pre-Anesthesia Assessment:                           - Prior to the procedure, a History and Physical                            was performed, and patient medications and                            allergies were reviewed. The patient's tolerance of                            previous anesthesia was also reviewed. The risks                            and benefits of the procedure and the sedation                            options and risks were discussed with the patient.                            All questions were answered, and informed consent                            was obtained. Prior Anticoagulants: The patient has                            taken no previous anticoagulant or antiplatelet                            agents. ASA Grade Assessment: III - A patient with  severe systemic disease. After reviewing the risks                            and benefits, the patient was deemed in                            satisfactory condition to undergo the procedure.                           After obtaining informed consent, the endoscope was                            passed under direct vision. Throughout the                             procedure, the patient's blood pressure, pulse, and                            oxygen saturations were monitored continuously. The                            [id number] ([id number]) scope was introduced through the                            mouth, and advanced to the third part of duodenum.                            The upper GI endoscopy was accomplished without                            difficulty. The patient tolerated the procedure                            well. Scope In: Scope Out: Findings:      The cords were visualized, and there was some edema in the larynx. The       edema in the larynx probably from intubation the airway.      A small hiatal hernia was present.      Localized mild inflammation characterized by erythema was found on the       lesser curvature of the stomach.      The duodenal bulb, first portion of the duodenum, second portion of the       duodenum and third portion of the duodenum were normal.      The exam was otherwise without abnormality. Impression:               - Laryngeal edema was found.                           - Small hiatal hernia.                           - ?Caustic gastritis? From NG tube while intubation  or from alcohol or Cameron lesions from vomiting                            and hiatal hernia.                           - Normal duodenal bulb, first portion of the                            duodenum, second portion of the duodenum and third                            portion of the duodenum.                           - The examination was otherwise normal.                           - No specimens collected. No signs of bleeding Moderate Sedation:      N/A- Per Anesthesia Care Recommendation:           - Patient has a contact number available for                            emergencies. The signs and symptoms of potential                            delayed complications were discussed with the                             patient. Return to normal activities tomorrow.                            Written discharge instructions were provided to the                            patient.                           - Soft diet today. Resume previous diet tomorrow                           - Continue present medications.                           - Return to GI clinic PRN. Overdue for colonic                            screening which we are happy to see back as                            outpatient when stable                           - Telephone GI clinic if symptomatic PRN. Alcohol  rehabilitation and please call us if we could be of                            any further assistance with this hospital stay Procedure Code(s):        --- Professional ---                           [phone], Esophagogastroduodenoscopy, flexible,                            transoral; diagnostic, including collection of                            specimen(s) by brushing or washing, when performed                            (separate procedure) Diagnosis Code(s):        --- Professional ---                           J38.4, Edema of larynx                           K44.9, Diaphragmatic hernia without obstruction or                            gangrene                           T54.94XA, Toxic effect of unspecified corrosive                            substance, undetermined, initial encounter                           T28.7XXA, Corrosion of other parts of alimentary                            tract, initial encounter                           K92.0, Hematemesis CPT copyright 2017 [employer]. All rights reserved. The codes documented in this report are preliminary and upon coder review may  be revised to meet current compliance requirements. Vida Rigger, MD 10/21/2017 8:18:40 AM This report has been signed electronically. Number of Addenda: 0

## 2017-10-21 NOTE — Progress Notes (Addendum)
Ashley Bradley 7:48 AM  Subjective: Patient feeling better than yesterday without any new complaints or signs of bleeding  Objective: Vital signs stable afebrile no acute distress exam please see preassessment evaluation BUN and creatinine continued decrease hemoglobin stable  Assessment: Seemingly self-limited hematemesis in a patient with probable anemia of chronic disease  Plan: Okay to proceed with endoscopy with anesthesia assistance with further workup and plans pending those findings  [facility],Corey Caulfield E  Pager [phone] After 5PM or if no answer call [phone]

## 2017-10-21 NOTE — Transfer of Care (Addendum)
Immediate Anesthesia Transfer of Care Note  Patient: Ashley Bradley  Procedure(s) Performed: ESOPHAGOGASTRODUODENOSCOPY (EGD) WITH PROPOFOL (N/A )  Patient Location: PACU  Anesthesia Type:MAC  Level of Consciousness: awake, alert , oriented and patient cooperative  Airway & Oxygen Therapy: Patient Spontanous Breathing and Patient connected to nasal cannula oxygen  Post-op Assessment: Report given to RN, Post -op Vital signs reviewed and stable and Patient moving all extremities X 4  Post vital signs: stable  Last Vitals:  Vitals Value Taken Time  BP    Temp    Pulse 103 10/21/2017  8:23 AM  Resp 20 10/21/2017  8:23 AM  SpO2 98 % 10/21/2017  8:23 AM  Vitals shown include unvalidated device data.  Last Pain:  Vitals:   10/21/17 0708  TempSrc: Oral  PainSc: 0-No pain      Patients Stated Pain Goal: 0 (40/08/67 6195)  Complications: No apparent anesthesia complications  Pt wide awake and transferred  On monitors to ICU O2 cannula 4l/min

## 2017-10-21 NOTE — Anesthesia Postprocedure Evaluation (Signed)
Anesthesia Post Note  Patient: ESTIE SPROULE  Procedure(s) Performed: ESOPHAGOGASTRODUODENOSCOPY (EGD) WITH PROPOFOL (N/A )     Patient location during evaluation: Endoscopy Anesthesia Type: MAC Level of consciousness: awake and alert, oriented and patient cooperative Pain management: pain level controlled Vital Signs Assessment: post-procedure vital signs reviewed and stable Respiratory status: spontaneous breathing, nonlabored ventilation, respiratory function stable and patient connected to nasal cannula oxygen Cardiovascular status: blood pressure returned to baseline and stable Postop Assessment: no apparent nausea or vomiting Anesthetic complications: no    Last Vitals:  Vitals:   10/21/17 0351 10/21/17 0708  BP:  122/65  Pulse:  84  Resp:  20  Temp: 37.9 C 37.2 C  SpO2:  100%    Last Pain:  Vitals:   10/21/17 0708  TempSrc: Oral  PainSc: 0-No pain                 Farrie Sann,E. Dell Briner

## 2017-10-21 NOTE — Progress Notes (Signed)
PROGRESS NOTE  YITTA GONGAWARE ZOX:[id number] DOB: 1950/08/01 DOA: 10/13/2017 PCP: Sigmund Hazel, MD   Brief summary:   Pt from home called by family on 4/12 for decrease mental status and tremors Pt drinks about 1/5 ETOH per day for the last 2 weeks but only drank 1/2 that amount on 4/12  She is admitted to icu, Required intubation due to AMS / poor airway protection.  Found to have hypovolemic shock with AKI, significantly elevated bun on presentation.  Required CVVHD + pressors for volume removal.  Extubated 4/14.    Concern for GIB with coffee ground emesis x1 on presentation.   She remain profoundly weak,   Spiked fever on 4/18 am, on abx for pneumonia/mssa bacteremia    HPI/Recap of past 24 hours:  tmax 100.2 hgb stable after prbc transfusion Urine output 1.3liter last 24hrs   She denies abdominal pain, No vomiting, no hematemesis   She is returned from EGD     Assessment/Plan: Active Problems:   AKI (acute kidney injury) (HCC)   High anion gap metabolic acidosis   Acute metabolic encephalopathy    MSSA bacteremia/ right lower lobe pneumonia -she developed Fever on 4/18 -Ct chest " Scattered ground-glass opacities including centrilobular ground-glass nodules and patchy consolidation most conspicuous in RIGHT lower lobe with air bronchograms." - Aspiration pneumonia? Aspiration precaution,  -Blood culture +MSSA -continue mucinex, flutter valve, nebs -she is started on unasyn since 4/18, abx changed to ancef plus flagyl on 4/19 -Discontinue central line on 4/19 -repeat echocardiogram, repeat blood culture -infectious disease input appreciated  Acute Normocytic anemia: Report hematemesisx1 on presentation hgb 9-10 at baseline hgb trending down to 6.8 today, FOBT+, with h/o alcohol use prbc transfusion x1 on 4/19 continue  Ppi,  EGD on 4/20 Gi input appreciated  Acute metabolic encephalopathy (presenting symptom): required intubation. Now extubated,  aaox3.  AKI on CKD III: Bun124/cr7.55 on presentation Required CVVHD + pressors for volume removal.  CRRT stopped 4/16 / off neo.   Over all improving Renal dosing meds, repeat lab in am  Elevated lft tbili 1.5, ast 80, alt 58 on presentation, likely from alcohol intoxication, hypotension.  lft improving Ct ab/pel from 2017 showed fatty liver Repeat ab imaging now showed "Marked hepatic steatosis."  hepatitis panel negative  Hypokalemia/hypomagnesemia: Remain low, continue to replace k/mag Repeat in am  Hypocalcemia: Ct ab with signs of pancreatitis, she currently denies pain improved    Alcohol abuse: Need social worker consult, consider naltraxone at discharge.   FTT,  start  PT eval      Code Status: full  Family Communication: patient   Disposition Plan: transfer to med surg   Consultants:  Critical care  Nephrology  Infectious disease  [facility]   Procedures:  CRRT  Intubation/extubation  IJ placement and removal  EGD on 4/20  Antibiotics:  unasyn from 4/18  Ancef /flagyl from 4/19   Objective: BP 122/65   Pulse 84   Temp 99 F (37.2 C) (Oral)   Resp 20   Ht 5\' 5"  (1.651 m)   Wt 62.1 kg (137 lb)   SpO2 100%   BMI 22.80 kg/m   Intake/Output Summary (Last 24 hours) at 10/21/2017 0745 Last data filed at 10/21/2017 0648 Gross per 24 hour  Intake 1334.17 ml  Output 1325 ml  Net 9.17 ml   Filed Weights   10/20/17 0500 10/21/17 0400 10/21/17 0708  Weight: 72.4 kg (159 lb 9.8 oz) 62.5 kg (137 lb 12.6 oz) 62.1  kg (137 lb)    Exam: Patient is examined daily including today on 10/21/2017, exams remain the same as of yesterday except that has changed    General:  Weak, frail, NAD  Cardiovascular: RRR  Respiratory: diminished at basis, no rhonchi, no wheezing  Abdomen: Soft/ND/NT, positive BS  Musculoskeletal: No Edema, 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)   Neuro: alert, oriented   Data Reviewed: Basic  Metabolic Panel: Recent Labs  Lab 10/15/17 1606  10/16/17 0328  10/16/17 1608  10/17/17 0352  10/18/17 0857 10/19/17 0616 10/20/17 0353 10/20/17 1605 10/21/17 0609  NA 140   < > 139   < > 139  140   < > 142   < > 139 137 136 137 138  K 4.8   < > 3.1*   < > 3.6  3.9   < > 3.9   < > 3.5 3.1* 2.7* 2.8* 3.7  CL 107   < > 101   < > 89*  92*   < > 86*   < > 84* 88* 90* 94* 98*  CO2 25  --  27  --  33*  --  41*  --  40* 34* 33* 31 29  GLUCOSE 91   < > 145*   < > 180*  140*   < > 135*   < > 87 79 112* 89 94  BUN 46*   < > 28*   < > 16  19   < > 15   < > 34* 38* 31* 26* 20  CREATININE 2.08*   < > 1.55*   < > 1.10*  1.40*   < > 1.48*   < > 2.40* 2.13* 1.66* 1.48* 1.27*  CALCIUM 7.2*  --  8.3*  --  9.2  --  11.6*  --  9.6 8.2* 7.4* 7.2* 7.3*  MG  --   --  1.8  --   --   --  1.6*  --  1.3*  --  1.2* 2.1  --   PHOS 1.8*  --  <1.0*  --  2.0*  --  2.3*  --  3.2  --   --   --   --    < > = values in this interval not displayed.   Liver Function Tests: Recent Labs  Lab 10/16/17 0328 10/16/17 1608 10/17/17 0352 10/18/17 0857 10/19/17 0616  AST  --   --   --  42* 38  ALT  --   --   --  36 30  ALKPHOS  --   --   --  168* 143*  BILITOT  --   --   --  1.1 1.1  PROT  --   --   --  5.7* 5.3*  ALBUMIN 2.4* 2.4* 2.5* 2.3* 2.1*   Recent Labs  Lab 10/19/17 0616 10/20/17 0353 10/21/17 0609  LIPASE 64* 69* 59*   No results for input(s): AMMONIA in the last 168 hours. CBC: Recent Labs  Lab 10/17/17 0352  10/18/17 0857 10/19/17 0616 10/20/17 0353 10/20/17 1605 10/21/17 0609  WBC 12.9*  --  15.4* 12.7* 10.5 7.5 6.7  NEUTROABS 8.0*  --  11.8* 10.0* 8.5*  --  4.7  HGB 9.5*   < > 8.2* 7.5* 6.8* 8.5* 8.7*  HCT 28.1*   < > 24.6* 22.3* 20.0* 24.7* 25.8*  MCV 91.2  --  91.4 89.9 88.9 87.3 88.7  PLT 158  --  229 256  318 363 373   < > = values in this interval not displayed.   Cardiac Enzymes:   No results for input(s): CKTOTAL, CKMB, CKMBINDEX, TROPONINI in the last 168 hours. BNP  (last 3 results) No results for input(s): BNP in the last 8760 hours.  ProBNP (last 3 results) No results for input(s): PROBNP in the last 8760 hours.  CBG: Recent Labs  Lab 10/15/17 1603 10/15/17 1941 10/15/17 2325 10/16/17 0322 10/16/17 0739  GLUCAP 85 91 160* 147* 135*    Recent Results (from the past 240 hour(s))  Culture, blood (Routine X 2) w Reflex to ID Panel     Status: None   Collection Time: 10/13/17  5:11 AM  Result Value Ref Range Status   Specimen Description   Final    BLOOD LEFT HAND Performed at [facility], [address] [place], [place] [address]    Special Requests   Final    BOTTLES DRAWN AEROBIC AND ANAEROBIC Blood Culture adequate volume Performed at [facility], [address] [place], [place] [address]    Culture   Final    NO GROWTH 5 DAYS Performed at [facility], [address] [place], [place] [address]    Report Status 10/18/2017 FINAL  Final  Culture, blood (Routine X 2) w Reflex to ID Panel     Status: None   Collection Time: 10/13/17  5:28 AM  Result Value Ref Range Status   Specimen Description   Final    BLOOD RIGHT HAND Performed at [facility], [address] [place], [place] [address]    Special Requests   Final    BOTTLES DRAWN AEROBIC AND ANAEROBIC Blood Culture adequate volume Performed at [facility], [address] [place], [place] [address]    Culture   Final    NO GROWTH 5 DAYS Performed at [facility], [address] [place], [place] [address]    Report Status 10/18/2017 FINAL  Final  MRSA PCR Screening     Status: None   Collection Time: 10/13/17  8:46 AM  Result Value Ref Range Status   MRSA by PCR NEGATIVE NEGATIVE Final    Comment:        The GeneXpert MRSA Assay (FDA approved for NASAL specimens only), is one component of a comprehensive MRSA colonization surveillance program. It is not intended to diagnose  MRSA infection nor to guide or monitor treatment for MRSA infections. Performed at [facility], [address] [place], [place] [address]   Culture, Urine     Status: Abnormal   Collection Time: 10/18/17  6:51 PM  Result Value Ref Range Status   Specimen Description   Final    URINE, RANDOM Performed at [facility], [address] [place], [place] [address]    Special Requests   Final    NONE Performed at [facility], [address] [place], [place] [address]    Culture (A)  Final    >=100,000 COLONIES/mL ENTEROBACTER CLOACAE 40,000 COLONIES/mL ESCHERICHIA COLI 40,000 COLONIES/mL ENTEROCOCCUS FAECALIS    Report Status 10/21/2017 FINAL  Final   Organism ID, Bacteria ENTEROBACTER CLOACAE (A)  Final   Organism ID, Bacteria ESCHERICHIA COLI (A)  Final   Organism ID, Bacteria ENTEROCOCCUS FAECALIS (A)  Final      Susceptibility   Enterobacter cloacae - MIC*    CEFAZOLIN >=64 RESISTANT Resistant  CEFTRIAXONE <=1 SENSITIVE Sensitive     CIPROFLOXACIN <=0.25 SENSITIVE Sensitive     GENTAMICIN <=1 SENSITIVE Sensitive     IMIPENEM 0.5 SENSITIVE Sensitive     NITROFURANTOIN 64 INTERMEDIATE Intermediate     TRIMETH/SULFA <=20 SENSITIVE Sensitive     PIP/TAZO <=4 SENSITIVE Sensitive     * >=100,000 COLONIES/mL ENTEROBACTER CLOACAE   Escherichia coli - MIC*    AMPICILLIN <=2 SENSITIVE Sensitive     CEFAZOLIN <=4 SENSITIVE Sensitive     CEFTRIAXONE <=1 SENSITIVE Sensitive     CIPROFLOXACIN <=0.25 SENSITIVE Sensitive     GENTAMICIN <=1 SENSITIVE Sensitive     IMIPENEM <=0.25 SENSITIVE Sensitive     NITROFURANTOIN <=16 SENSITIVE Sensitive     TRIMETH/SULFA <=20 SENSITIVE Sensitive     AMPICILLIN/SULBACTAM <=2 SENSITIVE Sensitive     PIP/TAZO <=4 SENSITIVE Sensitive     Extended ESBL NEGATIVE Sensitive     * 40,000 COLONIES/mL ESCHERICHIA COLI   Enterococcus faecalis - MIC*    AMPICILLIN <=2 SENSITIVE Sensitive      LEVOFLOXACIN 2 SENSITIVE Sensitive     NITROFURANTOIN <=16 SENSITIVE Sensitive     VANCOMYCIN 1 SENSITIVE Sensitive     * 40,000 COLONIES/mL ENTEROCOCCUS FAECALIS  Culture, blood (routine x 2)     Status: None (Preliminary result)   Collection Time: 10/19/17 10:02 AM  Result Value Ref Range Status   Specimen Description BLOOD RIGHT ANTECUBITAL  Final   Special Requests   Final    BOTTLES DRAWN AEROBIC AND ANAEROBIC Blood Culture adequate volume Performed at [facility], [address], [place], [place] [address]    Culture  Setup Time   Final    GRAM POSITIVE COCCI IN BOTH AEROBIC AND ANAEROBIC BOTTLES Organism ID to follow CRITICAL RESULT CALLED TO, READ BACK BY AND VERIFIED WITH: B GREEN PHARMD 10/20/17 0405 JDW    Culture GRAM POSITIVE COCCI  Final   Report Status PENDING  Incomplete  Blood Culture ID Panel (Reflexed)     Status: Abnormal   Collection Time: 10/19/17 10:02 AM  Result Value Ref Range Status   Enterococcus species NOT DETECTED NOT DETECTED Final   Listeria monocytogenes NOT DETECTED NOT DETECTED Final   Staphylococcus species DETECTED (A) NOT DETECTED Final    Comment: CRITICAL RESULT CALLED TO, READ BACK BY AND VERIFIED WITH: B GREEN PHARMD 10/20/17 0405 JDW    Staphylococcus aureus DETECTED (A) NOT DETECTED Final    Comment: Methicillin (oxacillin) susceptible Staphylococcus aureus (MSSA). Preferred therapy is anti staphylococcal beta lactam antibiotic (Cefazolin or Nafcillin), unless clinically contraindicated. CRITICAL RESULT CALLED TO, READ BACK BY AND VERIFIED WITH: B GREEN PHARMD 10/20/17 0405 JDW    Methicillin resistance NOT DETECTED NOT DETECTED Final   Streptococcus species NOT DETECTED NOT DETECTED Final   Streptococcus agalactiae NOT DETECTED NOT DETECTED Final   Streptococcus pneumoniae NOT DETECTED NOT DETECTED Final   Streptococcus pyogenes NOT DETECTED NOT DETECTED Final   Acinetobacter baumannii NOT DETECTED NOT DETECTED Final    Enterobacteriaceae species NOT DETECTED NOT DETECTED Final   Enterobacter cloacae complex NOT DETECTED NOT DETECTED Final   Escherichia coli NOT DETECTED NOT DETECTED Final   Klebsiella oxytoca NOT DETECTED NOT DETECTED Final   Klebsiella pneumoniae NOT DETECTED NOT DETECTED Final   Proteus species NOT DETECTED NOT DETECTED Final   Serratia marcescens NOT DETECTED NOT DETECTED Final   Haemophilus influenzae NOT DETECTED NOT DETECTED Final   Neisseria meningitidis NOT DETECTED NOT DETECTED Final   Pseudomonas aeruginosa  NOT DETECTED NOT DETECTED Final   Candida albicans NOT DETECTED NOT DETECTED Final   Candida glabrata NOT DETECTED NOT DETECTED Final   Candida krusei NOT DETECTED NOT DETECTED Final   Candida parapsilosis NOT DETECTED NOT DETECTED Final   Candida tropicalis NOT DETECTED NOT DETECTED Final  Culture, blood (routine x 2)     Status: None (Preliminary result)   Collection Time: 10/19/17 10:07 AM  Result Value Ref Range Status   Specimen Description BLOOD LEFT ANTECUBITAL  Final   Special Requests   Final    BOTTLES DRAWN AEROBIC AND ANAEROBIC Blood Culture adequate volume Performed at [facility], [address] [place], [place] [address]    Culture  Setup Time   Final    GRAM POSITIVE COCCI IN BOTH AEROBIC AND ANAEROBIC BOTTLES CRITICAL RESULT CALLED TO, READ BACK BY AND VERIFIED WITH: B GREEN PHARMD 10/20/17 0405 JDW    Culture GRAM POSITIVE COCCI  Final   Report Status PENDING  Incomplete     Studies: No results found.  Scheduled Meds: . [MAR Hold] Chlorhexidine Gluconate Cloth  6 each Topical Daily  . [MAR Hold] folic acid  1 mg Oral Daily  . [MAR Hold] guaiFENesin  600 mg Oral BID  . [MAR Hold] ipratropium-albuterol  3 mL Nebulization Once  . [MAR Hold] mouth rinse  15 mL Mouth Rinse BID  . [MAR Hold] metroNIDAZOLE  500 mg Oral Q8H  . [MAR Hold] multivitamin with minerals  1 tablet Oral Daily  . [MAR Hold] pantoprazole  40 mg Oral  Daily  . [MAR Hold] pneumococcal 23 valent vaccine  0.5 mL Intramuscular Tomorrow-1000  . [MAR Hold] senna-docusate  1 tablet Oral BID  . [MAR Hold] sodium chloride flush  10-40 mL Intracatheter Q12H    Continuous Infusions: . [MAR Hold]  ceFAZolin (ANCEF) IV Stopped (10/21/17 0532)  . lactated ringers 1,000 mL (10/21/17 0733)     Time spent: I have personally reviewed and interpreted on  10/21/2017 daily labs, tele strips, imagings as discussed above under date review session and assessment and plans.  I reviewed all nursing notes, pharmacy notes, consultant notes,  vitals, pertinent old records  I have discussed plan of care as described above with RN , patient  on 10/21/2017   Albertine Grates MD, PhD  [facility] Pager [phone]. If 7PM-7AM, please contact night-coverage at [email or web address], password [facility] 10/21/2017, 7:45 AM  LOS: 8 days

## 2017-10-21 NOTE — Progress Notes (Signed)
Pt has arrived to room 1503 from ICU. Alert, oriented. VS WNL

## 2017-10-22 ENCOUNTER — Encounter (HOSPITAL_COMMUNITY): Payer: Self-pay | Admitting: Gastroenterology

## 2017-10-22 LAB — HEPATIC FUNCTION PANEL
ALBUMIN: 2.1 g/dL — AB (ref 3.5–5.0)
ALK PHOS: 124 U/L (ref 38–126)
ALT: 10 U/L — AB (ref 14–54)
AST: 34 U/L (ref 15–41)
Bilirubin, Direct: 0.2 mg/dL (ref 0.1–0.5)
Indirect Bilirubin: 0.5 mg/dL (ref 0.3–0.9)
TOTAL PROTEIN: 5.3 g/dL — AB (ref 6.5–8.1)
Total Bilirubin: 0.7 mg/dL (ref 0.3–1.2)

## 2017-10-22 LAB — CULTURE, BLOOD (ROUTINE X 2)
SPECIAL REQUESTS: ADEQUATE
Special Requests: ADEQUATE

## 2017-10-22 LAB — BASIC METABOLIC PANEL
ANION GAP: 11 (ref 5–15)
BUN: 13 mg/dL (ref 6–20)
CHLORIDE: 102 mmol/L (ref 101–111)
CO2: 26 mmol/L (ref 22–32)
Calcium: 7.4 mg/dL — ABNORMAL LOW (ref 8.9–10.3)
Creatinine, Ser: 0.99 mg/dL (ref 0.44–1.00)
GFR calc Af Amer: >60 mL/min (ref >60)
GFR, EST NON AFRICAN AMERICAN: 57 mL/min — AB (ref >60)
GLUCOSE: 114 mg/dL — AB (ref 65–99)
POTASSIUM: 2.8 mmol/L — AB (ref 3.5–5.1)
Sodium: 139 mmol/L (ref 135–145)

## 2017-10-22 LAB — LIPASE, BLOOD: Lipase: 45 U/L (ref 11–51)

## 2017-10-22 LAB — MAGNESIUM: Magnesium: 1.3 mg/dL — ABNORMAL LOW (ref 1.7–2.4)

## 2017-10-22 MED ORDER — MAGNESIUM SULFATE 4 GM/100ML IV SOLN
4.0000 g | Freq: Once | INTRAVENOUS | Status: AC
  Administered 2017-10-22: 4 g via INTRAVENOUS
  Filled 2017-10-22: qty 100

## 2017-10-22 MED ORDER — POTASSIUM CHLORIDE CRYS ER 10 MEQ PO TBCR
40.0000 meq | EXTENDED_RELEASE_TABLET | ORAL | Status: AC
  Administered 2017-10-22 (×2): 40 meq via ORAL
  Filled 2017-10-22 (×2): qty 4

## 2017-10-22 MED ORDER — VANCOMYCIN HCL IN DEXTROSE 1-5 GM/200ML-% IV SOLN
1000.0000 mg | INTRAVENOUS | Status: DC
  Filled 2017-10-22: qty 200

## 2017-10-22 MED ORDER — VANCOMYCIN HCL 10 G IV SOLR
1500.0000 mg | Freq: Once | INTRAVENOUS | Status: AC
  Administered 2017-10-22: 1500 mg via INTRAVENOUS
  Filled 2017-10-22: qty 1500

## 2017-10-22 NOTE — Progress Notes (Signed)
Patient has degloving skin of both hands. fingers and palms.  md to flollow up this am . next  dose resch for 9am.  Not sure what is causing it.  But MD will have skin peeling  Assessed give recommendation.

## 2017-10-22 NOTE — Progress Notes (Signed)
PT Cancellation Note  Patient Details Name: Ashley Bradley MRN: [id number] DOB: 19-Feb-1951   Cancelled Treatment:    Reason Eval/Treat Not Completed: Pt politely requested PT check back another day.    Rebeca AlertJannie Arnel Bradley, MPT Pager: [phone]

## 2017-10-22 NOTE — Progress Notes (Signed)
Patient wanted to wait on both 0600 antibiotics till MD sees her about peeling of skin.

## 2017-10-22 NOTE — Progress Notes (Signed)
PROGRESS NOTE  Ashley Bradley:[id number] DOB: 05-Dec-1950 DOA: 10/13/2017 PCP: Sigmund Hazel, MD   Brief summary:   Pt from home called by family on 4/12 for decrease mental status and tremors Pt drinks about 1/5 ETOH per day for the last 2 weeks but only drank 1/2 that amount on 4/12  She is admitted to icu, Required intubation due to AMS / poor airway protection.  Found to have hypovolemic shock with AKI, significantly elevated bun on presentation.  Required CVVHD + pressors for volume removal.  Extubated 4/14.    Concern for GIB with coffee ground emesis x1 on presentation.   She remain profoundly weak,   Spiked fever on 4/18 am, on abx for pneumonia/mssa bacteremia    HPI/Recap of past 24 hours:  tmax 99.3 hgb stable after prbc transfusion Urine output 1.3liter last 24hrs   She denies abdominal pain, No vomiting, no hematemesis   She report throat is sore, difficult swallowing  She c/o skin is pealing on both hands  She has rash on her upper back  She has a spot on left heal     Assessment/Plan: Active Problems:   AKI (acute kidney injury) (HCC)   High anion gap metabolic acidosis   Acute metabolic encephalopathy    Pealing skin on both hand developed on 4/21 -not sure if abc reaction. I have reviewed chart, patient in the past received flagyl/keflex without issues -No oral mucosa involvement -case discussed with infection disease Dr snider who recommended switch abx to vanc.  Dysphagia/horaseness Since after extubation Overall improving EGD + laryngeal edema She passed swallow eval   MSSA bacteremia/ right lower lobe pneumonia -she developed Fever on 4/18 -Ct chest " Scattered ground-glass opacities including centrilobular ground-glass nodules and patchy consolidation most conspicuous in RIGHT lower lobe with air bronchograms." - Aspiration pneumonia? Aspiration precaution,  -Blood culture from 4/18+MSSA, repeat blood culture from 4/20 no  growth,  Repeat echo, no vegetation.  -central line discontinued on 4/19 -continue mucinex, flutter valve, nebs -she is started on unasyn since 4/18, abx changed to ancef plus flagyl on 4/19. Due to skin pealing on hands started on 4/21, abx changed from ancef to vanc ( case discussed with infectious disease Dr snider over the weekend) -infectious disease input appreciated  Acute Normocytic anemia: -Report hematemesisx1 on presentation, with elevated bun at 124 on presentation, FOBT+, with h/o alcohol use -hgb 9-10 at baseline, hgb trending down to 6.8 on 4/19,  -prbc transfusion x1 on 4/19 -continue  PPI,  -EGD on 4/20 "?Caustic gastritis? From NG tube while intubation or from alcohol or Cameron lesions from vomiting and hiatal hernia) Gi input appreciated  Acute metabolic encephalopathy (presenting symptom): required intubation. Now extubated, aaox3.  AKI on CKD III: Bun124/cr7.55 on presentation Required CVVHD + pressors for volume removal.  CRRT stopped 4/16 / off neo.   Over all improving Renal dosing meds, repeat lab in am  Elevated lft tbili 1.5, ast 80, alt 58 on presentation, likely from alcohol intoxication, hypotension.  lft improving Ct ab/pel from 2017 showed fatty liver Repeat ab imaging now showed "Marked hepatic steatosis."  hepatitis panel negative  Hypokalemia/hypomagnesemia: Remain low, continue to replace k/mag Repeat in am  Hypocalcemia: She received calcium gluconate initially Ct ab with signs of pancreatitis, she currently denies pain Lipase Peaked at 69, normalized Calcium normalized    Alcohol abuse: Need social worker consult, consider naltraxone at discharge.   FTT,  start  PT eval  Code Status: full  Family Communication: patient and husband at bedside on 4/21  Disposition Plan: start PT, hopefully d/c early next week Will need to discuss with ID regarding abx choice and duration.   Consultants:  Critical  care  Nephrology  Infectious disease  [facility]   Procedures:  CRRT  Intubation/extubation  IJ placement and removal  EGD on 4/20  Antibiotics:  unasyn from 4/18  Ancef from 4/19 to 4/21  Iv vanc from 4/21 flagyl from 4/19  Objective: BP 118/67 (BP Location: Left Arm)   Pulse 77   Temp 98.3 F (36.8 C) (Oral)   Resp 20   Ht 5\' 5"  (1.651 m)   Wt 74.6 kg (164 lb 7.4 oz)   SpO2 92%   BMI 27.37 kg/m   Intake/Output Summary (Last 24 hours) at 10/22/2017 16100822 Last data filed at 10/22/2017 0100 Gross per 24 hour  Intake 683 ml  Output 600 ml  Net 83 ml   Filed Weights   10/21/17 0400 10/21/17 0708 10/22/17 0605  Weight: 62.5 kg (137 lb 12.6 oz) 62.1 kg (137 lb) 74.6 kg (164 lb 7.4 oz)    Exam: Patient is examined daily including today on 10/22/2017, exams remain the same as of yesterday except that has changed    General:  NAD, appear stronger, less hoarse  Cardiovascular: RRR  Respiratory: diminished at basis, no rhonchi, no wheezing  Abdomen: Soft/ND/NT, positive BS  Musculoskeletal: No Edema, 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)   Neuro: alert, oriented   Skin:         Data Reviewed: Basic Metabolic Panel: Recent Labs  Lab 10/15/17 1606  10/16/17 0328  10/16/17 1608  10/17/17 0352  10/18/17 0857 10/19/17 0616 10/20/17 0353 10/20/17 1605 10/21/17 0609 10/22/17 0553  NA 140   < > 139   < > 139  140   < > 142   < > 139 137 136 137 138 139  K 4.8   < > 3.1*   < > 3.6  3.9   < > 3.9   < > 3.5 3.1* 2.7* 2.8* 3.7 2.8*  CL 107   < > 101   < > 89*  92*   < > 86*   < > 84* 88* 90* 94* 98* 102  CO2 25  --  27  --  33*  --  41*  --  40* 34* 33* 31 29 26   GLUCOSE 91   < > 145*   < > 180*  140*   < > 135*   < > 87 79 112* 89 94 114*  BUN 46*   < > 28*   < > 16  19   < > 15   < > 34* 38* 31* 26* 20 13  CREATININE 2.08*   < > 1.55*   < > 1.10*  1.40*   < > 1.48*   < > 2.40* 2.13* 1.66* 1.48* 1.27* 0.99  CALCIUM 7.2*  --  8.3*   --  9.2  --  11.6*  --  9.6 8.2* 7.4* 7.2* 7.3* 7.4*  MG  --    < > 1.8  --   --   --  1.6*  --  1.3*  --  1.2* 2.1  --  1.3*  PHOS 1.8*  --  <1.0*  --  2.0*  --  2.3*  --  3.2  --   --   --   --   --    < > =  values in this interval not displayed.   Liver Function Tests: Recent Labs  Lab 10/16/17 1608 10/17/17 0352 10/18/17 0857 10/19/17 0616 10/22/17 0553  AST  --   --  42* 38 34  ALT  --   --  36 30 10*  ALKPHOS  --   --  168* 143* 124  BILITOT  --   --  1.1 1.1 0.7  PROT  --   --  5.7* 5.3* 5.3*  ALBUMIN 2.4* 2.5* 2.3* 2.1* 2.1*   Recent Labs  Lab 10/19/17 0616 10/20/17 0353 10/21/17 0609 10/22/17 0553  LIPASE 64* 69* 59* 45   No results for input(s): AMMONIA in the last 168 hours. CBC: Recent Labs  Lab 10/17/17 0352  10/18/17 0857 10/19/17 0616 10/20/17 0353 10/20/17 1605 10/21/17 0609  WBC 12.9*  --  15.4* 12.7* 10.5 7.5 6.7  NEUTROABS 8.0*  --  11.8* 10.0* 8.5*  --  4.7  HGB 9.5*   < > 8.2* 7.5* 6.8* 8.5* 8.7*  HCT 28.1*   < > 24.6* 22.3* 20.0* 24.7* 25.8*  MCV 91.2  --  91.4 89.9 88.9 87.3 88.7  PLT 158  --  229 256 318 363 373   < > = values in this interval not displayed.   Cardiac Enzymes:   No results for input(s): CKTOTAL, CKMB, CKMBINDEX, TROPONINI in the last 168 hours. BNP (last 3 results) No results for input(s): BNP in the last 8760 hours.  ProBNP (last 3 results) No results for input(s): PROBNP in the last 8760 hours.  CBG: Recent Labs  Lab 10/15/17 1603 10/15/17 1941 10/15/17 2325 10/16/17 0322 10/16/17 0739  GLUCAP 85 91 160* 147* 135*    Recent Results (from the past 240 hour(s))  Culture, blood (Routine X 2) w Reflex to ID Panel     Status: None   Collection Time: 10/13/17  5:11 AM  Result Value Ref Range Status   Specimen Description   Final    BLOOD LEFT HAND Performed at [facility], [address] [place], [place] [address]    Special Requests   Final    BOTTLES DRAWN AEROBIC AND ANAEROBIC Blood  Culture adequate volume Performed at [facility], [address], [place], [place] [address]    Culture   Final    NO GROWTH 5 DAYS Performed at [facility], [address], [place], [place] [address]    Report Status 10/18/2017 FINAL  Final  Culture, blood (Routine X 2) w Reflex to ID Panel     Status: None   Collection Time: 10/13/17  5:28 AM  Result Value Ref Range Status   Specimen Description   Final    BLOOD RIGHT HAND Performed at [facility], [address], [place], [place] [address]    Special Requests   Final    BOTTLES DRAWN AEROBIC AND ANAEROBIC Blood Culture adequate volume Performed at [facility], [address], [place], [place] [address]    Culture   Final    NO GROWTH 5 DAYS Performed at [facility], [address] [place], [place] [address]    Report Status 10/18/2017 FINAL  Final  MRSA PCR Screening     Status: None   Collection Time: 10/13/17  8:46 AM  Result Value Ref Range Status   MRSA by PCR NEGATIVE NEGATIVE Final    Comment:        The GeneXpert MRSA Assay (FDA  approved for NASAL specimens only), is one component of a comprehensive MRSA colonization surveillance program. It is not intended to diagnose MRSA infection nor to guide or monitor treatment for MRSA infections. Performed at [facility], [address] [place], [place] [address]   Culture, Urine     Status: Abnormal   Collection Time: 10/18/17  6:51 PM  Result Value Ref Range Status   Specimen Description   Final    URINE, RANDOM Performed at [facility], [address] [place], [place] [address]    Special Requests   Final    NONE Performed at [facility], [address], [place], [place] [address]    Culture (A)  Final    >=100,000 COLONIES/mL ENTEROBACTER CLOACAE 40,000 COLONIES/mL ESCHERICHIA COLI 40,000 COLONIES/mL ENTEROCOCCUS FAECALIS     Report Status 10/21/2017 FINAL  Final   Organism ID, Bacteria ENTEROBACTER CLOACAE (A)  Final   Organism ID, Bacteria ESCHERICHIA COLI (A)  Final   Organism ID, Bacteria ENTEROCOCCUS FAECALIS (A)  Final      Susceptibility   Enterobacter cloacae - MIC*    CEFAZOLIN >=64 RESISTANT Resistant     CEFTRIAXONE <=1 SENSITIVE Sensitive     CIPROFLOXACIN <=0.25 SENSITIVE Sensitive     GENTAMICIN <=1 SENSITIVE Sensitive     IMIPENEM 0.5 SENSITIVE Sensitive     NITROFURANTOIN 64 INTERMEDIATE Intermediate     TRIMETH/SULFA <=20 SENSITIVE Sensitive     PIP/TAZO <=4 SENSITIVE Sensitive     * >=100,000 COLONIES/mL ENTEROBACTER CLOACAE   Escherichia coli - MIC*    AMPICILLIN <=2 SENSITIVE Sensitive     CEFAZOLIN <=4 SENSITIVE Sensitive     CEFTRIAXONE <=1 SENSITIVE Sensitive     CIPROFLOXACIN <=0.25 SENSITIVE Sensitive     GENTAMICIN <=1 SENSITIVE Sensitive     IMIPENEM <=0.25 SENSITIVE Sensitive     NITROFURANTOIN <=16 SENSITIVE Sensitive     TRIMETH/SULFA <=20 SENSITIVE Sensitive     AMPICILLIN/SULBACTAM <=2 SENSITIVE Sensitive     PIP/TAZO <=4 SENSITIVE Sensitive     Extended ESBL NEGATIVE Sensitive     * 40,000 COLONIES/mL ESCHERICHIA COLI   Enterococcus faecalis - MIC*    AMPICILLIN <=2 SENSITIVE Sensitive     LEVOFLOXACIN 2 SENSITIVE Sensitive     NITROFURANTOIN <=16 SENSITIVE Sensitive     VANCOMYCIN 1 SENSITIVE Sensitive     * 40,000 COLONIES/mL ENTEROCOCCUS FAECALIS  Culture, blood (routine x 2)     Status: Abnormal (Preliminary result)   Collection Time: 10/19/17 10:02 AM  Result Value Ref Range Status   Specimen Description   Final    BLOOD RIGHT ANTECUBITAL Performed at [facility], [address] [place], [place] [address]    Special Requests   Final    BOTTLES DRAWN AEROBIC AND ANAEROBIC Blood Culture adequate volume Performed at [facility], [address], [place], [place] [address]    Culture  Setup Time   Final    GRAM  POSITIVE COCCI IN BOTH AEROBIC AND ANAEROBIC BOTTLES CRITICAL RESULT CALLED TO, READ BACK BY AND VERIFIED WITH: B GREEN PHARMD 10/20/17 0405 JDW    Culture (A)  Final    STAPHYLOCOCCUS AUREUS SUSCEPTIBILITIES TO FOLLOW Performed at [facility], [address] [place], [place] [address]    Report Status PENDING  Incomplete  Blood Culture ID Panel (Reflexed)     Status: Abnormal   Collection Time: 10/19/17 10:02 AM  Result Value Ref Range Status  Enterococcus species NOT DETECTED NOT DETECTED Final   Listeria monocytogenes NOT DETECTED NOT DETECTED Final   Staphylococcus species DETECTED (A) NOT DETECTED Final    Comment: CRITICAL RESULT CALLED TO, READ BACK BY AND VERIFIED WITH: B GREEN PHARMD 10/20/17 0405 JDW    Staphylococcus aureus DETECTED (A) NOT DETECTED Final    Comment: Methicillin (oxacillin) susceptible Staphylococcus aureus (MSSA). Preferred therapy is anti staphylococcal beta lactam antibiotic (Cefazolin or Nafcillin), unless clinically contraindicated. CRITICAL RESULT CALLED TO, READ BACK BY AND VERIFIED WITH: B GREEN PHARMD 10/20/17 0405 JDW    Methicillin resistance NOT DETECTED NOT DETECTED Final   Streptococcus species NOT DETECTED NOT DETECTED Final   Streptococcus agalactiae NOT DETECTED NOT DETECTED Final   Streptococcus pneumoniae NOT DETECTED NOT DETECTED Final   Streptococcus pyogenes NOT DETECTED NOT DETECTED Final   Acinetobacter baumannii NOT DETECTED NOT DETECTED Final   Enterobacteriaceae species NOT DETECTED NOT DETECTED Final   Enterobacter cloacae complex NOT DETECTED NOT DETECTED Final   Escherichia coli NOT DETECTED NOT DETECTED Final   Klebsiella oxytoca NOT DETECTED NOT DETECTED Final   Klebsiella pneumoniae NOT DETECTED NOT DETECTED Final   Proteus species NOT DETECTED NOT DETECTED Final   Serratia marcescens NOT DETECTED NOT DETECTED Final   Haemophilus influenzae NOT DETECTED NOT DETECTED Final   Neisseria meningitidis NOT DETECTED NOT  DETECTED Final   Pseudomonas aeruginosa NOT DETECTED NOT DETECTED Final   Candida albicans NOT DETECTED NOT DETECTED Final   Candida glabrata NOT DETECTED NOT DETECTED Final   Candida krusei NOT DETECTED NOT DETECTED Final   Candida parapsilosis NOT DETECTED NOT DETECTED Final   Candida tropicalis NOT DETECTED NOT DETECTED Final  Culture, blood (routine x 2)     Status: Abnormal (Preliminary result)   Collection Time: 10/19/17 10:07 AM  Result Value Ref Range Status   Specimen Description   Final    BLOOD LEFT ANTECUBITAL Performed at [facility], [address] [place], [place] [address]    Special Requests   Final    BOTTLES DRAWN AEROBIC AND ANAEROBIC Blood Culture adequate volume Performed at [facility], [address], [place], [place] [address]    Culture  Setup Time   Final    GRAM POSITIVE COCCI IN BOTH AEROBIC AND ANAEROBIC BOTTLES CRITICAL RESULT CALLED TO, READ BACK BY AND VERIFIED WITH: B GREEN PHARMD 10/20/17 0405 JDW    Culture STAPHYLOCOCCUS AUREUS (A)  Final   Report Status PENDING  Incomplete     Studies: No results found.  Scheduled Meds: . Chlorhexidine Gluconate Cloth  6 each Topical Daily  . folic acid  1 mg Oral Daily  . guaiFENesin  600 mg Oral BID  . ipratropium-albuterol  3 mL Nebulization Once  . mouth rinse  15 mL Mouth Rinse BID  . metroNIDAZOLE  500 mg Oral Q8H  . multivitamin with minerals  1 tablet Oral Daily  . pantoprazole  40 mg Oral Daily  . pneumococcal 23 valent vaccine  0.5 mL Intramuscular Tomorrow-1000  . potassium chloride  40 mEq Oral Q4H  . senna-docusate  1 tablet Oral BID  . sodium chloride flush  10-40 mL Intracatheter Q12H    Continuous Infusions: . sodium chloride 20 mL/hr at 10/21/17 1118  .  ceFAZolin (ANCEF) IV Stopped (10/21/17 2347)  . magnesium sulfate 1 - 4 g bolus IVPB       Time spent: I have personally reviewed and interpreted on  10/22/2017 daily labs, tele  strips, imagings as discussed above under date review session and assessment and plans.  I reviewed all nursing notes, pharmacy notes, consultant notes,  vitals, pertinent old records  I have discussed plan of care as described above with RN , patient and husband  on 10/22/2017   Albertine Grates MD, PhD  [facility] Pager [phone]. If 7PM-7AM, please contact night-coverage at [email or web address], password [facility] 10/22/2017, 8:22 AM  LOS: 9 days

## 2017-10-22 NOTE — Progress Notes (Signed)
Pharmacy Antibiotic Note  Ashley Bradley is a 67 y.o. female admitted on 10/13/2017 with MSSA bacteremia, concern for allergic rxn to ancef.  Pharmacy has been consulted for vancomycin dosing.  Plan: Vancomycin 1500mg  IV x 1 then 1gm q24h (AUC 538.5, Scr 1.0) vanc levels as needed Follow renal function  Height: 5\' 5"  (165.1 cm) Weight: 164 lb 7.4 oz (74.6 kg) IBW/kg (Calculated) : 57  Temp (24hrs), Avg:98.6 F (37 C), Min:98.2 F (36.8 C), Max:99.3 F (37.4 C)  Recent Labs  Lab 10/18/17 0857 10/18/17 2040 10/19/17 0616 10/20/17 0353 10/20/17 1605 10/21/17 0609 10/22/17 0553  WBC 15.4*  --  12.7* 10.5 7.5 6.7  --   CREATININE 2.40*  --  2.13* 1.66* 1.48* 1.27* 0.99  LATICACIDVEN  --  0.7 0.8  --   --   --   --     Estimated Creatinine Clearance: 54.9 mL/min (by C-G formula based on SCr of 0.99 mg/dL).    Allergies  Allergen Reactions  . Macrodantin [Nitrofurantoin] Nausea And Vomiting    Antimicrobials this admission: 4/18 Unasyn >> 4/19 4/19 Ancef >> 4/21 4/19 Flagyl >>  4/21 vancomycin>>  Dose adjustments this admission:   Microbiology results: 4/12 BCx: NGF 4/12 MRSA PCR: negative 4/17 UCx:  >100K Enterobacter cloacae; 40K E.Coli; 40K Enterococcus faecalis 4/18 BCx:  Staph aureus, susceptibilies P 4/19 BCID 3 of 4 bottles MSSA  4/20 BCx: collected  Thank you for allowing pharmacy to be a part of this patient's care.  Arley Phenixllen Telford Archambeau RPh 10/22/2017, 11:52 AM Pager [phone]

## 2017-10-23 LAB — CBC WITH DIFFERENTIAL/PLATELET
BASOS ABS: 0 10*3/uL (ref 0.0–0.1)
BASOS PCT: 0 %
Band Neutrophils: 0 %
Blasts: 0 %
EOS PCT: 1 %
Eosinophils Absolute: 0.1 10*3/uL (ref 0.0–0.7)
HCT: 25.9 % — ABNORMAL LOW (ref 36.0–46.0)
Hemoglobin: 8.5 g/dL — ABNORMAL LOW (ref 12.0–15.0)
LYMPHS ABS: 1.2 10*3/uL (ref 0.7–4.0)
LYMPHS PCT: 23 %
MCH: 29.2 pg (ref 26.0–34.0)
MCHC: 32.8 g/dL (ref 30.0–36.0)
MCV: 89 fL (ref 78.0–100.0)
METAMYELOCYTES PCT: 0 %
Monocytes Absolute: 0.6 10*3/uL (ref 0.1–1.0)
Monocytes Relative: 12 %
Myelocytes: 0 %
NEUTROS ABS: 3.5 10*3/uL (ref 1.7–7.7)
NEUTROS PCT: 64 %
NRBC: 0 /100{WBCs}
Other: 0 %
PLATELETS: 564 10*3/uL — AB (ref 150–400)
Promyelocytes Relative: 0 %
RBC: 2.91 MIL/uL — ABNORMAL LOW (ref 3.87–5.11)
RDW: 17.3 % — AB (ref 11.5–15.5)
WBC: 5.4 10*3/uL (ref 4.0–10.5)

## 2017-10-23 LAB — BASIC METABOLIC PANEL
ANION GAP: 6 (ref 5–15)
BUN: 9 mg/dL (ref 6–20)
CALCIUM: 7.3 mg/dL — AB (ref 8.9–10.3)
CO2: 23 mmol/L (ref 22–32)
Chloride: 110 mmol/L (ref 101–111)
Creatinine, Ser: 0.69 mg/dL (ref 0.44–1.00)
GFR calc Af Amer: >60 mL/min (ref >60)
Glucose, Bld: 92 mg/dL (ref 65–99)
POTASSIUM: 3.7 mmol/L (ref 3.5–5.1)
Sodium: 139 mmol/L (ref 135–145)

## 2017-10-23 LAB — MAGNESIUM: MAGNESIUM: 1.8 mg/dL (ref 1.7–2.4)

## 2017-10-23 MED ORDER — VANCOMYCIN HCL 10 G IV SOLR
1250.0000 mg | INTRAVENOUS | Status: DC
  Administered 2017-10-23 – 2017-10-26 (×4): 1250 mg via INTRAVENOUS
  Filled 2017-10-23 (×4): qty 1250

## 2017-10-23 MED ORDER — HYDROCORTISONE 1 % EX CREA
TOPICAL_CREAM | Freq: Two times a day (BID) | CUTANEOUS | Status: DC
  Administered 2017-10-23: 15:00:00 via TOPICAL
  Administered 2017-10-23: 1 via TOPICAL
  Administered 2017-10-24 – 2017-10-26 (×5): via TOPICAL
  Filled 2017-10-23: qty 28

## 2017-10-23 NOTE — Progress Notes (Signed)
Pharmacy Antibiotic Note  Ashley Bradley is a 67 y.o. female admitted on 10/13/2017 with MSSA bacteremia, concern for allergic rxn to ancef.  Pharmacy has been consulted for vancomycin dosing.  Today, 10/23/2017  Day #5 abx (Day #2 since 1st BCx = NG)  Scr Improving  Afebrile  Repeat Bcx = NGTD  Note: patient has previously received zosyn, ceftriaxone in past without any apparent issues.  On ampicillin/sulbactam early this admission   Plan:  Adjust vancomycin to 1250mg  IV q24h for Improving renal fx  Consider change to nafcillin 2gm q4h if deemed rash due to cefazolin (or ceftriaxone?)  Monitor renal fx  Check vanco levels if remains on vancomycin   Height: 5\' 5"  (165.1 cm) Weight: 163 lb 5.8 oz (74.1 kg) IBW/kg (Calculated) : 57  Temp (24hrs), Avg:98.5 F (36.9 C), Min:98.3 F (36.8 C), Max:98.7 F (37.1 C)  Recent Labs  Lab 10/18/17 0857 10/18/17 2040 10/19/17 0616 10/20/17 0353 10/20/17 1605 10/21/17 0609 10/22/17 0553 10/23/17 0618  WBC 15.4*  --  12.7* 10.5 7.5 6.7  --   --   CREATININE 2.40*  --  2.13* 1.66* 1.48* 1.27* 0.99 0.69  LATICACIDVEN  --  0.7 0.8  --   --   --   --   --     Estimated Creatinine Clearance: 67.8 mL/min (by C-G formula based on SCr of 0.69 mg/dL).    Allergies  Allergen Reactions  . Macrodantin [Nitrofurantoin] Nausea And Vomiting    Antimicrobials this admission: 4/18 Unasyn >> 4/19 4/19 Ancef >>4/21 4/19 Flagyl >>  4/21 vanc >>   Dose adjustments this admission: 4/22 vanco 1gm to 1250mg  q24h for improved SCr   Microbiology results: 4/12 BCx: NGF 4/12 MRSA PCR: negative 4/17 UCx:  >100K Enterobacter cloacae; 40K E.Coli; 40K Enterococcus faecalis 4/18 BCx:  Staph aureus, susceptibilies P 4/19 BCID 3 of 4 bottles MSSA  4/20 BCx: NGTD   Thank you for allowing pharmacy to be a part of this patient's care.  Juliette Alcideustin Jamirah Zelaya, PharmD, BCPS.   Pager: [phone] 10/23/2017 8:15 AM

## 2017-10-23 NOTE — H&P (View-Only) (Signed)
[facility]   Reason for visit: Follow up on bacteremia  Interval History: repeat cultures with ngtd.  TTE without vegetation.  Feels improved, up in chair.  WBC wnl from 4/20.  Developed a rash over the weekend with peeling of hands, bilateral.  No where else.  No mucous membrane involvement.    Physical Exam: Constitutional:  Vitals:   10/22/17 2049 10/23/17 0541  BP: 126/69 125/66  Pulse: 90 80  Resp: 17 20  Temp: 98.4 F (36.9 C) 98.3 F (36.8 C)  SpO2: 93% 94%   patient appears in NAD HENT: no thrush Respiratory: Normal respiratory effort; CTA B Cardiovascular: RRR GI: soft, nt, nd Skin: both hands peeling, stops at wrist area  Review of Systems: Constitutional: negative for fevers and chills Gastrointestinal: negative for diarrhea  Lab Results  Component Value Date   WBC 6.7 10/21/2017   HGB 8.7 (L) 10/21/2017   HCT 25.8 (L) 10/21/2017   MCV 88.7 10/21/2017   PLT 373 10/21/2017    Lab Results  Component Value Date   CREATININE 0.69 10/23/2017   BUN 9 10/23/2017   NA 139 10/23/2017   K 3.7 10/23/2017   CL 110 10/23/2017   CO2 23 10/23/2017    Lab Results  Component Value Date   ALT 10 (L) 10/22/2017   AST 34 10/22/2017   ALKPHOS 124 10/22/2017     Microbiology: Recent Results (from the past 240 hour(s))  Culture, Urine     Status: Abnormal   Collection Time: 10/18/17  6:51 PM  Result Value Ref Range Status   Specimen Description   Final    URINE, RANDOM Performed at [facility], [address] [place], [place] [id number]    Special Requests   Final    NONE Performed at [facility], [address] [place], [place] [id number]    Culture (A)  Final    >=100,000 COLONIES/mL ENTEROBACTER CLOACAE 40,000 COLONIES/mL ESCHERICHIA COLI 40,000 COLONIES/mL ENTEROCOCCUS FAECALIS    Report Status 10/21/2017 FINAL  Final   Organism ID, Bacteria ENTEROBACTER CLOACAE (A)  Final   Organism ID, Bacteria ESCHERICHIA COLI (A)  Final   Organism ID, Bacteria ENTEROCOCCUS FAECALIS (A)  Final      Susceptibility   Enterobacter cloacae - MIC*    CEFAZOLIN >=64 RESISTANT Resistant     CEFTRIAXONE <=1 SENSITIVE Sensitive     CIPROFLOXACIN <=0.25 SENSITIVE Sensitive     GENTAMICIN <=1 SENSITIVE Sensitive     IMIPENEM 0.5 SENSITIVE Sensitive     NITROFURANTOIN 64 INTERMEDIATE Intermediate     TRIMETH/SULFA <=20 SENSITIVE Sensitive     PIP/TAZO <=4 SENSITIVE Sensitive     * >=100,000 COLONIES/mL ENTEROBACTER CLOACAE   Escherichia coli - MIC*    AMPICILLIN <=2 SENSITIVE Sensitive     CEFAZOLIN <=4 SENSITIVE Sensitive     CEFTRIAXONE <=1 SENSITIVE Sensitive     CIPROFLOXACIN <=0.25 SENSITIVE Sensitive     GENTAMICIN <=1 SENSITIVE Sensitive     IMIPENEM <=0.25 SENSITIVE Sensitive     NITROFURANTOIN <=16 SENSITIVE Sensitive     TRIMETH/SULFA <=20 SENSITIVE Sensitive     AMPICILLIN/SULBACTAM <=2 SENSITIVE Sensitive     PIP/TAZO <=4 SENSITIVE Sensitive     Extended ESBL NEGATIVE Sensitive     * 40,000 COLONIES/mL ESCHERICHIA COLI   Enterococcus faecalis - MIC*    AMPICILLIN <=2 SENSITIVE Sensitive     LEVOFLOXACIN 2 SENSITIVE Sensitive     NITROFURANTOIN <=16 SENSITIVE  Sensitive     VANCOMYCIN 1 SENSITIVE Sensitive     * 40,000 COLONIES/mL ENTEROCOCCUS FAECALIS  Culture, blood (routine x 2)     Status: Abnormal   Collection Time: 10/19/17 10:02 AM  Result Value Ref Range Status   Specimen Description   Final    BLOOD RIGHT ANTECUBITAL Performed at [facility], [address] [place], [place] [address]    Special Requests   Final    BOTTLES DRAWN AEROBIC AND ANAEROBIC Blood Culture adequate volume Performed at [facility], [address] [place], [place] [address]    Culture  Setup Time   Final    GRAM POSITIVE COCCI IN BOTH AEROBIC AND ANAEROBIC BOTTLES CRITICAL RESULT CALLED TO, READ BACK BY AND VERIFIED WITH: B GREEN  PHARMD 10/20/17 0405 JDW Performed at [facility], [address], [place], [place] [address]    Culture STAPHYLOCOCCUS AUREUS (A)  Final   Report Status 10/22/2017 FINAL  Final   Organism ID, Bacteria STAPHYLOCOCCUS AUREUS  Final      Susceptibility   Staphylococcus aureus - MIC*    CIPROFLOXACIN <=0.5 SENSITIVE Sensitive     ERYTHROMYCIN <=0.25 SENSITIVE Sensitive     GENTAMICIN <=0.5 SENSITIVE Sensitive     OXACILLIN <=0.25 SENSITIVE Sensitive     TETRACYCLINE <=1 SENSITIVE Sensitive     VANCOMYCIN 1 SENSITIVE Sensitive     TRIMETH/SULFA <=10 SENSITIVE Sensitive     CLINDAMYCIN <=0.25 SENSITIVE Sensitive     RIFAMPIN <=0.5 SENSITIVE Sensitive     Inducible Clindamycin NEGATIVE Sensitive     * STAPHYLOCOCCUS AUREUS  Blood Culture ID Panel (Reflexed)     Status: Abnormal   Collection Time: 10/19/17 10:02 AM  Result Value Ref Range Status   Enterococcus species NOT DETECTED NOT DETECTED Final   Listeria monocytogenes NOT DETECTED NOT DETECTED Final   Staphylococcus species DETECTED (A) NOT DETECTED Final    Comment: CRITICAL RESULT CALLED TO, READ BACK BY AND VERIFIED WITH: B GREEN PHARMD 10/20/17 0405 JDW    Staphylococcus aureus DETECTED (A) NOT DETECTED Final    Comment: Methicillin (oxacillin) susceptible Staphylococcus aureus (MSSA). Preferred therapy is anti staphylococcal beta lactam antibiotic (Cefazolin or Nafcillin), unless clinically contraindicated. CRITICAL RESULT CALLED TO, READ BACK BY AND VERIFIED WITH: B GREEN PHARMD 10/20/17 0405 JDW    Methicillin resistance NOT DETECTED NOT DETECTED Final   Streptococcus species NOT DETECTED NOT DETECTED Final   Streptococcus agalactiae NOT DETECTED NOT DETECTED Final   Streptococcus pneumoniae NOT DETECTED NOT DETECTED Final   Streptococcus pyogenes NOT DETECTED NOT DETECTED Final   Acinetobacter baumannii NOT DETECTED NOT DETECTED Final   Enterobacteriaceae species NOT DETECTED NOT DETECTED Final   Enterobacter  cloacae complex NOT DETECTED NOT DETECTED Final   Escherichia coli NOT DETECTED NOT DETECTED Final   Klebsiella oxytoca NOT DETECTED NOT DETECTED Final   Klebsiella pneumoniae NOT DETECTED NOT DETECTED Final   Proteus species NOT DETECTED NOT DETECTED Final   Serratia marcescens NOT DETECTED NOT DETECTED Final   Haemophilus influenzae NOT DETECTED NOT DETECTED Final   Neisseria meningitidis NOT DETECTED NOT DETECTED Final   Pseudomonas aeruginosa NOT DETECTED NOT DETECTED Final   Candida albicans NOT DETECTED NOT DETECTED Final   Candida glabrata NOT DETECTED NOT DETECTED Final   Candida krusei NOT DETECTED NOT DETECTED Final   Candida parapsilosis NOT DETECTED NOT DETECTED Final   Candida tropicalis NOT DETECTED NOT DETECTED Final  Culture, blood (routine x 2)  Status: Abnormal   Collection Time: 10/19/17 10:07 AM  Result Value Ref Range Status   Specimen Description   Final    BLOOD LEFT ANTECUBITAL Performed at [facility], [address] [place], [place] [address]    Special Requests   Final    BOTTLES DRAWN AEROBIC AND ANAEROBIC Blood Culture adequate volume Performed at [facility], [address], [place], [place] [address]    Culture  Setup Time   Final    GRAM POSITIVE COCCI IN BOTH AEROBIC AND ANAEROBIC BOTTLES CRITICAL RESULT CALLED TO, READ BACK BY AND VERIFIED WITH: B GREEN PHARMD 10/20/17 0405 JDW    Culture (A)  Final    STAPHYLOCOCCUS AUREUS SUSCEPTIBILITIES PERFORMED ON PREVIOUS CULTURE WITHIN THE LAST 5 DAYS. Performed at [facility], [address], [place], [place] [address]    Report Status 10/22/2017 FINAL  Final  Culture, blood (routine x 2)     Status: None (Preliminary result)   Collection Time: 10/21/17  6:09 AM  Result Value Ref Range Status   Specimen Description   Final    BLOOD RIGHT ANTECUBITAL Performed at [facility], [address] [place], [place] [address]    Special  Requests   Final    BOTTLES DRAWN AEROBIC ONLY Blood Culture adequate volume Performed at [facility], [address] [place], [place] [address]    Culture   Final    NO GROWTH 1 DAY Performed at [facility], [address], [place], [place] [address]    Report Status PENDING  Incomplete  Culture, blood (routine x 2)     Status: None (Preliminary result)   Collection Time: 10/21/17  6:09 AM  Result Value Ref Range Status   Specimen Description   Final    BLOOD LEFT HAND Performed at [facility], [address] [place], [place] [address]    Special Requests   Final    BOTTLES DRAWN AEROBIC ONLY Blood Culture results may not be optimal due to an inadequate volume of blood received in culture bottles Performed at [facility], [address] [place], [place] [address]    Culture   Final    NO GROWTH 1 DAY Performed at [facility], [address] [place], [place] [address]    Report Status PENDING  Incomplete    Impression/Plan:  1. MSSA bacteremia - on vancomycin and will continue.  Monitoring blood cultures and remain ngtd. I recommend a TEE which will determine duration.  She will need at least 2 weeks, depending on the result.    2.  Rash - unclear etiology.  Differential could be infection/sepsis, antibiotic.  Continue off of cefazolin.   3.  Access - ok for picc line tomorrow if blood cultures remain ngtd.

## 2017-10-23 NOTE — Progress Notes (Signed)
PROGRESS NOTE  Ashley Bradley ZOX:[id number] DOB: May 15, 1951 DOA: 10/13/2017 PCP: Sigmund Hazel, MD   Brief summary:   Pt from home called by family on 4/12 for decrease mental status and tremors Pt drinks about 1/5 ETOH per day for the last 2 weeks but only drank 1/2 that amount on 4/12  She is admitted to icu, Required intubation due to AMS / poor airway protection.  Found to have hypovolemic shock with AKI, significantly elevated bun on presentation.  Required CVVHD + pressors for volume removal.  Extubated 4/14.    Concern for GIB with coffee ground emesis x1 on presentation.   She remain profoundly weak,   Spiked fever on 4/18 am, on abx for pneumonia/mssa bacteremia    HPI/Recap of past 24 hours:  No fever for the last 24hr hgb stable for the last three days after prbc transfusion   She denies abdominal pain, No vomiting, no hematemesis   She is less hoarse, appear stronger  She c/o skin is pealing on both hands  She has rash on her upper back  She has a spot on left heal     Assessment/Plan: Active Problems:   AKI (acute kidney injury) (HCC)   High anion gap metabolic acidosis   Acute metabolic encephalopathy    Pealing skin on both hand developed on 4/21 -not sure if abc reaction. I have reviewed chart, patient in the past received flagyl/keflex without issues -No oral mucosa involvement -case discussed with infection disease Dr snider who recommended switch abx to vanc.  Dysphagia/horaseness Since after extubation Overall improving EGD + laryngeal edema She passed swallow eval   MSSA bacteremia/ right lower lobe pneumonia -she developed Fever on 4/18 -Ct chest " Scattered ground-glass opacities including centrilobular ground-glass nodules and patchy consolidation most conspicuous in RIGHT lower lobe with air bronchograms." - Aspiration pneumonia? Aspiration precaution,  -Blood culture from 4/18+MSSA, repeat blood culture from 4/20 no growth,   Repeat echo, no vegetation.  -central line discontinued on 4/19 -continue mucinex, flutter valve, nebs -she is started on unasyn since 4/18, abx changed to ancef plus flagyl on 4/19. Due to skin pealing on hands started on 4/21, abx changed from ancef to vanc ( case discussed with infectious disease Dr snider over the weekend) -ID recommended TEE, will contact cardiology for TEE -infectious disease input appreciated  Acute Normocytic anemia: -Report hematemesisx1 on presentation, with elevated bun at 124 on presentation, FOBT+, with h/o alcohol use -hgb 9-10 at baseline, hgb trending down to 6.8 on 4/19,  -prbc transfusion x1 on 4/19 -continue  PPI,  -EGD on 4/20 "?Caustic gastritis? From NG tube while intubation or from alcohol or Cameron lesions from vomiting and hiatal hernia) Gi input appreciated  Acute metabolic encephalopathy (presenting symptom): required intubation. Now extubated, aaox3.  AKI on CKD III: Bun124/cr7.55 on presentation Required CVVHD + pressors for volume removal.  CRRT stopped 4/16 / off neo.   Over all improving Renal dosing meds, repeat lab in am  Elevated lft tbili 1.5, ast 80, alt 58 on presentation, likely from alcohol intoxication, hypotension.  lft improving Ct ab/pel from 2017 showed fatty liver Repeat ab imaging now showed "Marked hepatic steatosis."  hepatitis panel negative  Hypokalemia/hypomagnesemia: Remain low, continue to replace k/mag Repeat in am  Hypocalcemia: She received calcium gluconate initially Ct ab with signs of pancreatitis, she currently denies pain Lipase Peaked at 69, normalized Calcium normalized    Alcohol abuse: Need social worker consult, consider naltraxone at discharge.  FTT,  start  PT eval      Code Status: full  Family Communication: patient and husband at bedside on 4/21  Disposition Plan: hopefully d/c home in 1-2 days after TEE and picc line placement Need clearance from  ID.   Consultants:  Critical care  Nephrology  Infectious disease  [facility]  cardiology   Procedures:  CRRT  Intubation/extubation  IJ placement and removal  EGD on 4/20  Antibiotics:  unasyn from 4/18  Ancef from 4/19 to 4/21  Iv vanc from 4/21 flagyl from 4/19  Objective: BP 139/74 (BP Location: Left Arm)   Pulse 98   Temp 98.1 F (36.7 C)   Resp 16   Ht 5\' 5"  (1.651 m)   Wt 74.1 kg (163 lb 5.8 oz)   SpO2 95%   BMI 27.18 kg/m   Intake/Output Summary (Last 24 hours) at 10/23/2017 1703 Last data filed at 10/23/2017 1500 Gross per 24 hour  Intake 807 ml  Output -  Net 807 ml   Filed Weights   10/21/17 0708 10/22/17 0605 10/23/17 0541  Weight: 62.1 kg (137 lb) 74.6 kg (164 lb 7.4 oz) 74.1 kg (163 lb 5.8 oz)    Exam: Patient is examined daily including today on 10/23/2017, exams remain the same as of yesterday except that has changed    General:  NAD, appear stronger, less hoarse  Cardiovascular: RRR  Respiratory: diminished at basis, no rhonchi, no wheezing  Abdomen: Soft/ND/NT, positive BS  Musculoskeletal: No Edema, 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)   Neuro: alert, oriented   Skin:         Data Reviewed: Basic Metabolic Panel: Recent Labs  Lab 10/17/17 0352  10/18/17 0857  10/20/17 0353 10/20/17 1605 10/21/17 0609 10/22/17 0553 10/23/17 0618  NA 142   < > 139   < > 136 137 138 139 139  K 3.9   < > 3.5   < > 2.7* 2.8* 3.7 2.8* 3.7  CL 86*   < > 84*   < > 90* 94* 98* 102 110  CO2 41*  --  40*   < > 33* 31 29 26 23   GLUCOSE 135*   < > 87   < > 112* 89 94 114* 92  BUN 15   < > 34*   < > 31* 26* 20 13 9   CREATININE 1.48*   < > 2.40*   < > 1.66* 1.48* 1.27* 0.99 0.69  CALCIUM 11.6*  --  9.6   < > 7.4* 7.2* 7.3* 7.4* 7.3*  MG 1.6*  --  1.3*  --  1.2* 2.1  --  1.3* 1.8  PHOS 2.3*  --  3.2  --   --   --   --   --   --    < > = values in this interval not displayed.   Liver Function Tests: Recent Labs  Lab  10/17/17 0352 10/18/17 0857 10/19/17 0616 10/22/17 0553  AST  --  42* 38 34  ALT  --  36 30 10*  ALKPHOS  --  168* 143* 124  BILITOT  --  1.1 1.1 0.7  PROT  --  5.7* 5.3* 5.3*  ALBUMIN 2.5* 2.3* 2.1* 2.1*   Recent Labs  Lab 10/19/17 0616 10/20/17 0353 10/21/17 0609 10/22/17 0553  LIPASE 64* 69* 59* 45   No results for input(s): AMMONIA in the last 168 hours. CBC: Recent Labs  Lab 10/18/17 0857 10/19/17 5409  10/20/17 0353 10/20/17 1605 10/21/17 0609 10/23/17 1553  WBC 15.4* 12.7* 10.5 7.5 6.7 5.4  NEUTROABS 11.8* 10.0* 8.5*  --  4.7 PENDING  HGB 8.2* 7.5* 6.8* 8.5* 8.7* 8.5*  HCT 24.6* 22.3* 20.0* 24.7* 25.8* 25.9*  MCV 91.4 89.9 88.9 87.3 88.7 89.0  PLT 229 256 318 363 373 564*   Cardiac Enzymes:   No results for input(s): CKTOTAL, CKMB, CKMBINDEX, TROPONINI in the last 168 hours. BNP (last 3 results) No results for input(s): BNP in the last 8760 hours.  ProBNP (last 3 results) No results for input(s): PROBNP in the last 8760 hours.  CBG: No results for input(s): GLUCAP in the last 168 hours.  Recent Results (from the past 240 hour(s))  Culture, Urine     Status: Abnormal   Collection Time: 10/18/17  6:51 PM  Result Value Ref Range Status   Specimen Description   Final    URINE, RANDOM Performed at [facility], [address], [place], [place] [address]    Special Requests   Final    NONE Performed at [facility], [address] [place], [place] [address]    Culture (A)  Final    >=100,000 COLONIES/mL ENTEROBACTER CLOACAE 40,000 COLONIES/mL ESCHERICHIA COLI 40,000 COLONIES/mL ENTEROCOCCUS FAECALIS    Report Status 10/21/2017 FINAL  Final   Organism ID, Bacteria ENTEROBACTER CLOACAE (A)  Final   Organism ID, Bacteria ESCHERICHIA COLI (A)  Final   Organism ID, Bacteria ENTEROCOCCUS FAECALIS (A)  Final      Susceptibility   Enterobacter cloacae - MIC*    CEFAZOLIN >=64 RESISTANT Resistant     CEFTRIAXONE <=1  SENSITIVE Sensitive     CIPROFLOXACIN <=0.25 SENSITIVE Sensitive     GENTAMICIN <=1 SENSITIVE Sensitive     IMIPENEM 0.5 SENSITIVE Sensitive     NITROFURANTOIN 64 INTERMEDIATE Intermediate     TRIMETH/SULFA <=20 SENSITIVE Sensitive     PIP/TAZO <=4 SENSITIVE Sensitive     * >=100,000 COLONIES/mL ENTEROBACTER CLOACAE   Escherichia coli - MIC*    AMPICILLIN <=2 SENSITIVE Sensitive     CEFAZOLIN <=4 SENSITIVE Sensitive     CEFTRIAXONE <=1 SENSITIVE Sensitive     CIPROFLOXACIN <=0.25 SENSITIVE Sensitive     GENTAMICIN <=1 SENSITIVE Sensitive     IMIPENEM <=0.25 SENSITIVE Sensitive     NITROFURANTOIN <=16 SENSITIVE Sensitive     TRIMETH/SULFA <=20 SENSITIVE Sensitive     AMPICILLIN/SULBACTAM <=2 SENSITIVE Sensitive     PIP/TAZO <=4 SENSITIVE Sensitive     Extended ESBL NEGATIVE Sensitive     * 40,000 COLONIES/mL ESCHERICHIA COLI   Enterococcus faecalis - MIC*    AMPICILLIN <=2 SENSITIVE Sensitive     LEVOFLOXACIN 2 SENSITIVE Sensitive     NITROFURANTOIN <=16 SENSITIVE Sensitive     VANCOMYCIN 1 SENSITIVE Sensitive     * 40,000 COLONIES/mL ENTEROCOCCUS FAECALIS  Culture, blood (routine x 2)     Status: Abnormal   Collection Time: 10/19/17 10:02 AM  Result Value Ref Range Status   Specimen Description   Final    BLOOD RIGHT ANTECUBITAL Performed at [facility], [address], [place], [place] [address]    Special Requests   Final    BOTTLES DRAWN AEROBIC AND ANAEROBIC Blood Culture adequate volume Performed at [facility], [address] [place], [place] [address]    Culture  Setup Time   Final    GRAM POSITIVE COCCI IN BOTH AEROBIC AND ANAEROBIC BOTTLES CRITICAL  RESULT CALLED TO, READ BACK BY AND VERIFIED WITH: B GREEN PHARMD 10/20/17 0405 JDW Performed at [facility], [address] [place], [place] [address]    Culture STAPHYLOCOCCUS AUREUS (A)  Final   Report Status 10/22/2017 FINAL  Final   Organism ID, Bacteria  STAPHYLOCOCCUS AUREUS  Final      Susceptibility   Staphylococcus aureus - MIC*    CIPROFLOXACIN <=0.5 SENSITIVE Sensitive     ERYTHROMYCIN <=0.25 SENSITIVE Sensitive     GENTAMICIN <=0.5 SENSITIVE Sensitive     OXACILLIN <=0.25 SENSITIVE Sensitive     TETRACYCLINE <=1 SENSITIVE Sensitive     VANCOMYCIN 1 SENSITIVE Sensitive     TRIMETH/SULFA <=10 SENSITIVE Sensitive     CLINDAMYCIN <=0.25 SENSITIVE Sensitive     RIFAMPIN <=0.5 SENSITIVE Sensitive     Inducible Clindamycin NEGATIVE Sensitive     * STAPHYLOCOCCUS AUREUS  Blood Culture ID Panel (Reflexed)     Status: Abnormal   Collection Time: 10/19/17 10:02 AM  Result Value Ref Range Status   Enterococcus species NOT DETECTED NOT DETECTED Final   Listeria monocytogenes NOT DETECTED NOT DETECTED Final   Staphylococcus species DETECTED (A) NOT DETECTED Final    Comment: CRITICAL RESULT CALLED TO, READ BACK BY AND VERIFIED WITH: B GREEN PHARMD 10/20/17 0405 JDW    Staphylococcus aureus DETECTED (A) NOT DETECTED Final    Comment: Methicillin (oxacillin) susceptible Staphylococcus aureus (MSSA). Preferred therapy is anti staphylococcal beta lactam antibiotic (Cefazolin or Nafcillin), unless clinically contraindicated. CRITICAL RESULT CALLED TO, READ BACK BY AND VERIFIED WITH: B GREEN PHARMD 10/20/17 0405 JDW    Methicillin resistance NOT DETECTED NOT DETECTED Final   Streptococcus species NOT DETECTED NOT DETECTED Final   Streptococcus agalactiae NOT DETECTED NOT DETECTED Final   Streptococcus pneumoniae NOT DETECTED NOT DETECTED Final   Streptococcus pyogenes NOT DETECTED NOT DETECTED Final   Acinetobacter baumannii NOT DETECTED NOT DETECTED Final   Enterobacteriaceae species NOT DETECTED NOT DETECTED Final   Enterobacter cloacae complex NOT DETECTED NOT DETECTED Final   Escherichia coli NOT DETECTED NOT DETECTED Final   Klebsiella oxytoca NOT DETECTED NOT DETECTED Final   Klebsiella pneumoniae NOT DETECTED NOT DETECTED Final    Proteus species NOT DETECTED NOT DETECTED Final   Serratia marcescens NOT DETECTED NOT DETECTED Final   Haemophilus influenzae NOT DETECTED NOT DETECTED Final   Neisseria meningitidis NOT DETECTED NOT DETECTED Final   Pseudomonas aeruginosa NOT DETECTED NOT DETECTED Final   Candida albicans NOT DETECTED NOT DETECTED Final   Candida glabrata NOT DETECTED NOT DETECTED Final   Candida krusei NOT DETECTED NOT DETECTED Final   Candida parapsilosis NOT DETECTED NOT DETECTED Final   Candida tropicalis NOT DETECTED NOT DETECTED Final  Culture, blood (routine x 2)     Status: Abnormal   Collection Time: 10/19/17 10:07 AM  Result Value Ref Range Status   Specimen Description   Final    BLOOD LEFT ANTECUBITAL Performed at [facility], [address] [place], [place] [address]    Special Requests   Final    BOTTLES DRAWN AEROBIC AND ANAEROBIC Blood Culture adequate volume Performed at [facility], [address] [place], [place] [address]    Culture  Setup Time   Final    GRAM POSITIVE COCCI IN BOTH AEROBIC AND ANAEROBIC BOTTLES CRITICAL RESULT CALLED TO, READ BACK BY AND VERIFIED WITH: B GREEN PHARMD 10/20/17 0405 JDW    Culture (A)  Final    STAPHYLOCOCCUS  AUREUS SUSCEPTIBILITIES PERFORMED ON PREVIOUS CULTURE WITHIN THE LAST 5 DAYS. Performed at [facility], [address] [place], [place] [address]    Report Status 10/22/2017 FINAL  Final  Culture, blood (routine x 2)     Status: None (Preliminary result)   Collection Time: 10/21/17  6:09 AM  Result Value Ref Range Status   Specimen Description   Final    BLOOD RIGHT ANTECUBITAL Performed at [facility], [address], [place], [place] [address]    Special Requests   Final    BOTTLES DRAWN AEROBIC ONLY Blood Culture adequate volume Performed at [facility], [address] [place], [place] [address]    Culture   Final    NO GROWTH 2  DAYS Performed at [facility], [address] [place], [place] [address]    Report Status PENDING  Incomplete  Culture, blood (routine x 2)     Status: None (Preliminary result)   Collection Time: 10/21/17  6:09 AM  Result Value Ref Range Status   Specimen Description   Final    BLOOD LEFT HAND Performed at [facility], [address] [place], [place] [address]    Special Requests   Final    BOTTLES DRAWN AEROBIC ONLY Blood Culture results may not be optimal due to an inadequate volume of blood received in culture bottles Performed at [facility], [address], [place], [place] [address]    Culture   Final    NO GROWTH 2 DAYS Performed at [facility], [address], [place], [place] [address]    Report Status PENDING  Incomplete     Studies: No results found.  Scheduled Meds: . Chlorhexidine Gluconate Cloth  6 each Topical Daily  . folic acid  1 mg Oral Daily  . guaiFENesin  600 mg Oral BID  . hydrocortisone cream   Topical BID  . ipratropium-albuterol  3 mL Nebulization Once  . mouth rinse  15 mL Mouth Rinse BID  . metroNIDAZOLE  500 mg Oral Q8H  . multivitamin with minerals  1 tablet Oral Daily  . pantoprazole  40 mg Oral Daily  . pneumococcal 23 valent vaccine  0.5 mL Intramuscular Tomorrow-1000  . sodium chloride flush  10-40 mL Intracatheter Q12H    Continuous Infusions: . sodium chloride Stopped (10/22/17 2210)  . vancomycin Stopped (10/23/17 1008)     Time spent: I have personally reviewed and interpreted on  10/23/2017 daily labs, tele strips, imagings as discussed above under date review session and assessment and plans.  I reviewed all nursing notes, pharmacy notes, consultant notes,  vitals, pertinent old records  I have discussed plan of care as described above with RN , patient  on 10/23/2017   Albertine Grates MD, PhD  [facility] Pager [phone]. If 7PM-7AM, please contact  night-coverage at [email or web address], password [facility] 10/23/2017, 5:03 PM  LOS: 10 days

## 2017-10-23 NOTE — Evaluation (Signed)
Physical Therapy Evaluation Patient Details Name: Ashley BowensDarlene S Steinmeyer MRN: [id number] DOB: December 22, 1950 Today's Date: 10/23/2017   History of Present Illness  Pt is a 67 year old female admitted with AMS 10/13/17 and required intubation due to poor airway protection, Found to have hypovolemic shock with AKI.  PMHx signficant for ETOH dependence, ETOH withdrawal seizures, cerebral palsy   Clinical Impression  Pt admitted with above diagnosis. Pt currently with functional limitations due to the deficits listed below (see PT Problem List).  Pt will benefit from skilled PT to increase their independence and safety with mobility to allow discharge to the venue listed below.  Pt eagerly anticipating PT today.  Pt assisted with ambulating short distance in hallway.  Pt fatigues quickly and presents with limited endurance.  Pt also presents with generalized weakness and decreased balance.  Pt reports her spouse can assist her at home.  If family unable to provide current assist level then pt may need SNF.      Follow Up Recommendations Home health PT;Supervision/Assistance - 24 hour    Equipment Recommendations  Rolling walker with 5" wheels    Recommendations for Other Services       Precautions / Restrictions Precautions Precautions: Fall Precaution Comments: R foot drop, L heel pressure injury       Mobility  Bed Mobility Overal bed mobility: Needs Assistance Bed Mobility: Supine to Sit     Supine to sit: Min guard;HOB elevated        Transfers Overall transfer level: Needs assistance Equipment used: Rolling walker (2 wheeled) Transfers: Sit to/from Stand Sit to Stand: Min assist         General transfer comment: verbal cues for safe technique, assist to rise and steady  Ambulation/Gait Ambulation/Gait assistance: Min assist Ambulation Distance (Feet): 18 Feet Assistive device: Rolling walker (2 wheeled) Gait Pattern/deviations: Step-through pattern;Decreased dorsiflexion -  right;Decreased stride length     General Gait Details: very unsteady, uncoordinated gait, distance limited by fatigue, Spo2 93% room air and HR 125 bpm upon return to recliner  Stairs            Wheelchair Mobility    Modified Rankin (Stroke Patients Only)       Balance Overall balance assessment: History of Falls;Needs assistance         Standing balance support: Bilateral upper extremity supported Standing balance-Leahy Scale: Poor Standing balance comment: requires UE support and external steadying assist with challenges                             Pertinent Vitals/Pain Pain Assessment: No/denies pain    Home Living Family/patient expects to be discharged to:: Private residence Living Arrangements: Spouse/significant other Available Help at Discharge: Family Type of Home: House Home Access: Stairs to enter   Secretary/administratorntrance Stairs-Number of Steps: 2 Home Layout: Able to live on main level with bedroom/bathroom Home Equipment: Walker - 4 wheels      Prior Function Level of Independence: Independent               Hand Dominance        Extremity/Trunk Assessment        Lower Extremity Assessment Lower Extremity Assessment: Generalized weakness;RLE deficits/detail RLE Deficits / Details: foot drop, 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, states worse when she does not perform mobility or with increased ETOH consumption, does not have orthosis       Communication   Communication:  No difficulties  Cognition Arousal/Alertness: Awake/alert Behavior During Therapy: WFL for tasks assessed/performed Overall Cognitive Status: Within Functional Limits for tasks assessed                                        General Comments      Exercises     Assessment/Plan    PT Assessment Patient needs continued PT services  PT Problem List Decreased strength;Decreased mobility;Decreased balance;Decreased knowledge of use of  DME;Decreased activity tolerance       PT Treatment Interventions Gait training;Therapeutic exercise;Patient/family education;DME instruction;Therapeutic activities;Stair training;Functional mobility training;Balance training    PT Goals (Current goals can be found in the Care Plan section)  Acute Rehab PT Goals PT Goal Formulation: With patient Time For Goal Achievement: 10/30/17 Potential to Achieve Goals: Good    Frequency Min 3X/week   Barriers to discharge        Co-evaluation               AM-PAC PT "6 Clicks" Daily Activity  Outcome Measure Difficulty turning over in bed (including adjusting bedclothes, sheets and blankets)?: A Little Difficulty moving from lying on back to sitting on the side of the bed? : A Little Difficulty sitting down on and standing up from a chair with arms (e.g., wheelchair, bedside commode, etc,.)?: Unable Help needed moving to and from a bed to chair (including a wheelchair)?: A Little Help needed walking in hospital room?: A Little Help needed climbing 3-5 steps with a railing? : A Lot 6 Click Score: 15    End of Session Equipment Utilized During Treatment: Gait belt Activity Tolerance: Patient limited by fatigue Patient left: in chair;with call bell/phone within reach Nurse Communication: Mobility status PT Visit Diagnosis: Other abnormalities of gait and mobility (R26.89)    Time: 1610-9604 PT Time Calculation (min) (ACUTE ONLY): 14 min   Charges:   PT Evaluation $PT Eval Low Complexity: 1 Low     PT G CodesZenovia Jarred, PT, DPT 10/23/2017 Pager: [phone]  Maida Sale E 10/23/2017, 1:02 PM

## 2017-10-23 NOTE — Progress Notes (Signed)
  Speech Language Pathology Treatment: Dysphagia  Patient Details Name: Ashley Bradley MRN: [id number] DOB: March 20, 1951 Today's Date: 10/23/2017 Time: [id number] SLP Time Calculation (min) (ACUTE ONLY): 12 min  Assessment / Plan / Recommendation Clinical Impression  Pt fully alert and sitting upright in chair today.   Her voice is much improved and oral lesions on soft palate are improved.  Note, pt desires diet advancement today - and states kitchen will not provide a banana to her.  SLP reviewed GI note where they recommended soft/thin diet on date of EGD but advancing to prior diet next date.  Pt passed 3 ounce water test easily - no indication of aspiration/airway compromise with all po observed.    3 ounces water, graham cracker.  She admits to minimal odynophagia with solid intake *likely from intubation related edema* but states has improved.  She self reports vocal strength being 8/10  - 10 being normal.    Recommend advance diet to regular/thin - No SLP follow up as suspect pt swallow is near baseline *premorbid dysphagia post-cervical spine surgery x2.  All education completed.  Thanks for allowing me to help care for this pt.   Of note, pt admits she likely had aspiration pna from drinking ETOH - she is aware of aspiration precautions.     HPI HPI: 67 [facility] - required intubation for hypovolemic shock secondary to AKI.  Pt with coffee ground emesis, hoarse, sore throat and h/o dysphagia s/p ACDF 10 years ago with dysphagia following.   MBS indicated due to premorbid dysphagia, intubation-dysphonia and concern for pna.        SLP Plan   Regular/thin diet Medicine as tolerated Reflux precautions  No SLP follow up        Recommendations   all education completed, no follow up indicated                        GO                Ashley AbrahamsKimball, Ashley Bradley 10/23/2017, 11:22 AM  Ashley Burnetamara [name], MS [facility] SLP [phone]

## 2017-10-23 NOTE — Progress Notes (Signed)
[facility]   Reason for visit: Follow up on bacteremia  Interval History: repeat cultures with ngtd.  TTE without vegetation.  Feels improved, up in chair.  WBC wnl from 4/20.  Developed a rash over the weekend with peeling of hands, bilateral.  No where else.  No mucous membrane involvement.    Physical Exam: Constitutional:  Vitals:   10/22/17 2049 10/23/17 0541  BP: 126/69 125/66  Pulse: 90 80  Resp: 17 20  Temp: 98.4 F (36.9 C) 98.3 F (36.8 C)  SpO2: 93% 94%   patient appears in NAD HENT: no thrush Respiratory: Normal respiratory effort; CTA B Cardiovascular: RRR GI: soft, nt, nd Skin: both hands peeling, stops at wrist area  Review of Systems: Constitutional: negative for fevers and chills Gastrointestinal: negative for diarrhea  Lab Results  Component Value Date   WBC 6.7 10/21/2017   HGB 8.7 (L) 10/21/2017   HCT 25.8 (L) 10/21/2017   MCV 88.7 10/21/2017   PLT 373 10/21/2017    Lab Results  Component Value Date   CREATININE 0.69 10/23/2017   BUN 9 10/23/2017   NA 139 10/23/2017   K 3.7 10/23/2017   CL 110 10/23/2017   CO2 23 10/23/2017    Lab Results  Component Value Date   ALT 10 (L) 10/22/2017   AST 34 10/22/2017   ALKPHOS 124 10/22/2017     Microbiology: Recent Results (from the past 240 hour(s))  Culture, Urine     Status: Abnormal   Collection Time: 10/18/17  6:51 PM  Result Value Ref Range Status   Specimen Description   Final    URINE, RANDOM Performed at [facility], [address] [place], [place] [id number]    Special Requests   Final    NONE Performed at [facility], [address] [place], [place] [id number]    Culture (A)  Final    >=100,000 COLONIES/mL ENTEROBACTER CLOACAE 40,000 COLONIES/mL ESCHERICHIA COLI 40,000 COLONIES/mL ENTEROCOCCUS FAECALIS    Report Status 10/21/2017 FINAL  Final   Organism ID, Bacteria ENTEROBACTER CLOACAE (A)  Final   Organism ID, Bacteria ESCHERICHIA COLI (A)  Final   Organism ID, Bacteria ENTEROCOCCUS FAECALIS (A)  Final      Susceptibility   Enterobacter cloacae - MIC*    CEFAZOLIN >=64 RESISTANT Resistant     CEFTRIAXONE <=1 SENSITIVE Sensitive     CIPROFLOXACIN <=0.25 SENSITIVE Sensitive     GENTAMICIN <=1 SENSITIVE Sensitive     IMIPENEM 0.5 SENSITIVE Sensitive     NITROFURANTOIN 64 INTERMEDIATE Intermediate     TRIMETH/SULFA <=20 SENSITIVE Sensitive     PIP/TAZO <=4 SENSITIVE Sensitive     * >=100,000 COLONIES/mL ENTEROBACTER CLOACAE   Escherichia coli - MIC*    AMPICILLIN <=2 SENSITIVE Sensitive     CEFAZOLIN <=4 SENSITIVE Sensitive     CEFTRIAXONE <=1 SENSITIVE Sensitive     CIPROFLOXACIN <=0.25 SENSITIVE Sensitive     GENTAMICIN <=1 SENSITIVE Sensitive     IMIPENEM <=0.25 SENSITIVE Sensitive     NITROFURANTOIN <=16 SENSITIVE Sensitive     TRIMETH/SULFA <=20 SENSITIVE Sensitive     AMPICILLIN/SULBACTAM <=2 SENSITIVE Sensitive     PIP/TAZO <=4 SENSITIVE Sensitive     Extended ESBL NEGATIVE Sensitive     * 40,000 COLONIES/mL ESCHERICHIA COLI   Enterococcus faecalis - MIC*    AMPICILLIN <=2 SENSITIVE Sensitive     LEVOFLOXACIN 2 SENSITIVE Sensitive     NITROFURANTOIN <=16 SENSITIVE  Sensitive     VANCOMYCIN 1 SENSITIVE Sensitive     * 40,000 COLONIES/mL ENTEROCOCCUS FAECALIS  Culture, blood (routine x 2)     Status: Abnormal   Collection Time: 10/19/17 10:02 AM  Result Value Ref Range Status   Specimen Description   Final    BLOOD RIGHT ANTECUBITAL Performed at [facility], [address] [place], [place] [address]    Special Requests   Final    BOTTLES DRAWN AEROBIC AND ANAEROBIC Blood Culture adequate volume Performed at [facility], [address] [place], [place] [address]    Culture  Setup Time   Final    GRAM POSITIVE COCCI IN BOTH AEROBIC AND ANAEROBIC BOTTLES CRITICAL RESULT CALLED TO, READ BACK BY AND VERIFIED WITH: B GREEN  PHARMD 10/20/17 0405 JDW Performed at [facility], [address], [place], [place] [address]    Culture STAPHYLOCOCCUS AUREUS (A)  Final   Report Status 10/22/2017 FINAL  Final   Organism ID, Bacteria STAPHYLOCOCCUS AUREUS  Final      Susceptibility   Staphylococcus aureus - MIC*    CIPROFLOXACIN <=0.5 SENSITIVE Sensitive     ERYTHROMYCIN <=0.25 SENSITIVE Sensitive     GENTAMICIN <=0.5 SENSITIVE Sensitive     OXACILLIN <=0.25 SENSITIVE Sensitive     TETRACYCLINE <=1 SENSITIVE Sensitive     VANCOMYCIN 1 SENSITIVE Sensitive     TRIMETH/SULFA <=10 SENSITIVE Sensitive     CLINDAMYCIN <=0.25 SENSITIVE Sensitive     RIFAMPIN <=0.5 SENSITIVE Sensitive     Inducible Clindamycin NEGATIVE Sensitive     * STAPHYLOCOCCUS AUREUS  Blood Culture ID Panel (Reflexed)     Status: Abnormal   Collection Time: 10/19/17 10:02 AM  Result Value Ref Range Status   Enterococcus species NOT DETECTED NOT DETECTED Final   Listeria monocytogenes NOT DETECTED NOT DETECTED Final   Staphylococcus species DETECTED (A) NOT DETECTED Final    Comment: CRITICAL RESULT CALLED TO, READ BACK BY AND VERIFIED WITH: B GREEN PHARMD 10/20/17 0405 JDW    Staphylococcus aureus DETECTED (A) NOT DETECTED Final    Comment: Methicillin (oxacillin) susceptible Staphylococcus aureus (MSSA). Preferred therapy is anti staphylococcal beta lactam antibiotic (Cefazolin or Nafcillin), unless clinically contraindicated. CRITICAL RESULT CALLED TO, READ BACK BY AND VERIFIED WITH: B GREEN PHARMD 10/20/17 0405 JDW    Methicillin resistance NOT DETECTED NOT DETECTED Final   Streptococcus species NOT DETECTED NOT DETECTED Final   Streptococcus agalactiae NOT DETECTED NOT DETECTED Final   Streptococcus pneumoniae NOT DETECTED NOT DETECTED Final   Streptococcus pyogenes NOT DETECTED NOT DETECTED Final   Acinetobacter baumannii NOT DETECTED NOT DETECTED Final   Enterobacteriaceae species NOT DETECTED NOT DETECTED Final   Enterobacter  cloacae complex NOT DETECTED NOT DETECTED Final   Escherichia coli NOT DETECTED NOT DETECTED Final   Klebsiella oxytoca NOT DETECTED NOT DETECTED Final   Klebsiella pneumoniae NOT DETECTED NOT DETECTED Final   Proteus species NOT DETECTED NOT DETECTED Final   Serratia marcescens NOT DETECTED NOT DETECTED Final   Haemophilus influenzae NOT DETECTED NOT DETECTED Final   Neisseria meningitidis NOT DETECTED NOT DETECTED Final   Pseudomonas aeruginosa NOT DETECTED NOT DETECTED Final   Candida albicans NOT DETECTED NOT DETECTED Final   Candida glabrata NOT DETECTED NOT DETECTED Final   Candida krusei NOT DETECTED NOT DETECTED Final   Candida parapsilosis NOT DETECTED NOT DETECTED Final   Candida tropicalis NOT DETECTED NOT DETECTED Final  Culture, blood (routine x 2)  Status: Abnormal   Collection Time: 10/19/17 10:07 AM  Result Value Ref Range Status   Specimen Description   Final    BLOOD LEFT ANTECUBITAL Performed at [facility], [address] [place], [place] [address]    Special Requests   Final    BOTTLES DRAWN AEROBIC AND ANAEROBIC Blood Culture adequate volume Performed at [facility], [address], [place], [place] [address]    Culture  Setup Time   Final    GRAM POSITIVE COCCI IN BOTH AEROBIC AND ANAEROBIC BOTTLES CRITICAL RESULT CALLED TO, READ BACK BY AND VERIFIED WITH: B GREEN PHARMD 10/20/17 0405 JDW    Culture (A)  Final    STAPHYLOCOCCUS AUREUS SUSCEPTIBILITIES PERFORMED ON PREVIOUS CULTURE WITHIN THE LAST 5 DAYS. Performed at [facility], [address], [place], [place] [address]    Report Status 10/22/2017 FINAL  Final  Culture, blood (routine x 2)     Status: None (Preliminary result)   Collection Time: 10/21/17  6:09 AM  Result Value Ref Range Status   Specimen Description   Final    BLOOD RIGHT ANTECUBITAL Performed at [facility], [address] [place], [place] [address]    Special  Requests   Final    BOTTLES DRAWN AEROBIC ONLY Blood Culture adequate volume Performed at [facility], [address] [place], [place] [address]    Culture   Final    NO GROWTH 1 DAY Performed at [facility], [address], [place], [place] [address]    Report Status PENDING  Incomplete  Culture, blood (routine x 2)     Status: None (Preliminary result)   Collection Time: 10/21/17  6:09 AM  Result Value Ref Range Status   Specimen Description   Final    BLOOD LEFT HAND Performed at [facility], [address] [place], [place] [address]    Special Requests   Final    BOTTLES DRAWN AEROBIC ONLY Blood Culture results may not be optimal due to an inadequate volume of blood received in culture bottles Performed at [facility], [address] [place], [place] [address]    Culture   Final    NO GROWTH 1 DAY Performed at [facility], [address] [place], [place] [address]    Report Status PENDING  Incomplete    Impression/Plan:  1. MSSA bacteremia - on vancomycin and will continue.  Monitoring blood cultures and remain ngtd. I recommend a TEE which will determine duration.  She will need at least 2 weeks, depending on the result.    2.  Rash - unclear etiology.  Differential could be infection/sepsis, antibiotic.  Continue off of cefazolin.   3.  Access - ok for picc line tomorrow if blood cultures remain ngtd.

## 2017-10-24 ENCOUNTER — Encounter (HOSPITAL_COMMUNITY)
Admission: EM | Disposition: A | Discharge status: Home Health | Payer: Self-pay | Source: Home / Self Care | Attending: Internal Medicine

## 2017-10-24 ENCOUNTER — Encounter (HOSPITAL_COMMUNITY): Payer: Self-pay | Admitting: *Deleted

## 2017-10-24 ENCOUNTER — Inpatient Hospital Stay (HOSPITAL_COMMUNITY): Payer: Medicare Other

## 2017-10-24 DIAGNOSIS — I340 Nonrheumatic mitral (valve) insufficiency: Secondary | ICD-10-CM

## 2017-10-24 DIAGNOSIS — R7881 Bacteremia: Secondary | ICD-10-CM

## 2017-10-24 HISTORY — PX: TEE WITHOUT CARDIOVERSION: SHX5443

## 2017-10-24 LAB — BASIC METABOLIC PANEL
ANION GAP: 8 (ref 5–15)
BUN: 6 mg/dL (ref 6–20)
CHLORIDE: 112 mmol/L — AB (ref 101–111)
CO2: 23 mmol/L (ref 22–32)
Calcium: 7.9 mg/dL — ABNORMAL LOW (ref 8.9–10.3)
Creatinine, Ser: 0.65 mg/dL (ref 0.44–1.00)
GFR calc Af Amer: >60 mL/min (ref >60)
GFR calc non Af Amer: >60 mL/min (ref >60)
GLUCOSE: 111 mg/dL — AB (ref 65–99)
POTASSIUM: 3.3 mmol/L — AB (ref 3.5–5.1)
Sodium: 143 mmol/L (ref 135–145)

## 2017-10-24 SURGERY — ECHOCARDIOGRAM, TRANSESOPHAGEAL
Anesthesia: Moderate Sedation

## 2017-10-24 MED ORDER — MIDAZOLAM HCL 5 MG/ML IJ SOLN
INTRAMUSCULAR | Status: DC | PRN
  Administered 2017-10-24 (×2): 1 mg via INTRAVENOUS
  Administered 2017-10-24: 2 mg via INTRAVENOUS
  Administered 2017-10-24 (×2): 1 mg via INTRAVENOUS
  Administered 2017-10-24: 2 mg via INTRAVENOUS

## 2017-10-24 MED ORDER — BUTAMBEN-TETRACAINE-BENZOCAINE 2-2-14 % EX AERO
INHALATION_SPRAY | CUTANEOUS | Status: DC | PRN
  Administered 2017-10-24: 2 via TOPICAL

## 2017-10-24 MED ORDER — FENTANYL CITRATE (PF) 100 MCG/2ML IJ SOLN
INTRAMUSCULAR | Status: DC | PRN
  Administered 2017-10-24 (×4): 25 ug via INTRAVENOUS

## 2017-10-24 MED ORDER — POTASSIUM CHLORIDE CRYS ER 10 MEQ PO TBCR
40.0000 meq | EXTENDED_RELEASE_TABLET | Freq: Once | ORAL | Status: AC
  Administered 2017-10-24: 40 meq via ORAL
  Filled 2017-10-24: qty 4

## 2017-10-24 MED ORDER — FENTANYL CITRATE (PF) 100 MCG/2ML IJ SOLN
INTRAMUSCULAR | Status: AC
  Filled 2017-10-24: qty 2

## 2017-10-24 MED ORDER — MIDAZOLAM HCL 5 MG/ML IJ SOLN
INTRAMUSCULAR | Status: AC
  Filled 2017-10-24: qty 2

## 2017-10-24 MED ORDER — SODIUM CHLORIDE 0.9 % IV SOLN
INTRAVENOUS | Status: DC
  Administered 2017-10-24: 500 mL via INTRAVENOUS

## 2017-10-24 NOTE — Progress Notes (Signed)
    [facility] has been requested to perform a transesophageal echocardiogram on 04/23 for bacteremia.  After careful review of history and examination, the risks and benefits of transesophageal echocardiogram have been explained including risks of esophageal damage, perforation (1:10,000 risk), bleeding, pharyngeal hematoma as well as other potential complications associated with conscious sedation including aspiration, arrhythmia, respiratory failure and death. Alternatives to treatment were discussed, questions were answered. Patient is willing to proceed.   Theodore Demarkhonda Barrett, PA-C 10/24/2017 10:13 AM

## 2017-10-24 NOTE — Progress Notes (Signed)
PROGRESS NOTE  Ashley Bradley ZOX:[id number] DOB: 08/17/1950 DOA: 10/13/2017 PCP: Sigmund Hazel, MD   Brief summary:   Pt from home called by family on 4/12 for decrease mental status and tremors Pt drinks about 1/5 ETOH per day for the last 2 weeks but only drank 1/2 that amount on 4/12  She is admitted to icu, Required intubation due to AMS / poor airway protection.  Found to have hypovolemic shock with AKI, significantly elevated bun on presentation.  Required CVVHD + pressors for volume removal.  Extubated 4/14.    Concern for GIB with coffee ground emesis x1 on presentation.   She remain profoundly weak,   Spiked fever on 4/18 am, on abx for pneumonia/mssa bacteremia    HPI/Recap of past 24 hours:  No fever for the last 24hr hgb stable for the last four days after prbc transfusion   She denies abdominal pain, No vomiting, no hematemesis   She is less hoarse, appear stronger, able to start regular diet now  skin pealing on both hands now complete, it appears skin pealing started on left foot ( sole)  She has rash on her upper back  She has a spot on left heal     Assessment/Plan: Active Problems:   AKI (acute kidney injury) (HCC)   High anion gap metabolic acidosis   Acute metabolic encephalopathy    Pealing skin on both hand developed on 4/21 -not sure if abx reaction. I have reviewed chart, patient in the past received flagyl/keflex without issues -No oral mucosa involvement -case discussed with infection disease Dr snider who recommended switch abx from ancef to vanc.  Dysphagia/horaseness Since after extubation Overall improving EGD + laryngeal edema She passed swallow eval, now on regular diet   MSSA bacteremia/ right lower lobe pneumonia -she developed Fever on 4/18 -Ct chest " Scattered ground-glass opacities including centrilobular ground-glass nodules and patchy consolidation most conspicuous in RIGHT lower lobe with air bronchograms." -  Aspiration pneumonia? Aspiration precaution,  -Blood culture from 4/18+MSSA, repeat blood culture from 4/20 no growth,  Repeat echo, no vegetation.  -central line discontinued on 4/19 -continue mucinex, flutter valve, nebs -she is started on unasyn since 4/18, abx changed to ancef plus flagyl on 4/19. Due to skin pealing on hands started on 4/21, abx changed from ancef to vanc ( case discussed with infectious disease Dr snider over the weekend) -ID recommended TEE -TEE today on 4/23 -infectious disease input appreciated  Acute Normocytic anemia: -Report hematemesisx1 on presentation, with elevated bun at 124 on presentation, FOBT+, with h/o alcohol use -hgb 9-10 at baseline, hgb trending down to 6.8 on 4/19,  -prbc transfusion x1 on 4/19 -continue  PPI,  -EGD on 4/20 "?Caustic gastritis? From NG tube while intubation or from alcohol or Cameron lesions from vomiting and hiatal hernia) Gi input appreciated  Acute metabolic encephalopathy (presenting symptom): required intubation. Now extubated, aaox3.  AKI on CKD III: Bun124/cr7.55 on presentation Required CVVHD + pressors for volume removal.  CRRT stopped 4/16 / off neo.   Over all improving Renal dosing meds, repeat lab in am  Elevated lft tbili 1.5, ast 80, alt 58 on presentation, likely from alcohol intoxication, hypotension.  lft improving Ct ab/pel from 2017 showed fatty liver Repeat ab imaging now showed "Marked hepatic steatosis."  hepatitis panel negative  Hypokalemia/hypomagnesemia: Remain low, continue to replace k/mag Repeat in am  Hypocalcemia: She received calcium gluconate initially Ct ab with signs of pancreatitis, she currently denies pain Lipase Peaked  at 69, normalized Calcium normalized    Alcohol abuse: Need social worker consult, consider naltraxone at discharge.   FTT,  start  PT eval      Code Status: full  Family Communication: patient and husband at bedside on 4/21  Disposition Plan:  hopefully d/c home in 1-2 days after TEE and picc line placement Need clearance from ID.   Consultants:  Critical care  Nephrology  Infectious disease  [facility]  cardiology   Procedures:  CRRT  Intubation/extubation  IJ placement and removal  EGD on 4/20  Antibiotics:  unasyn from 4/18  Ancef from 4/19 to 4/21  Iv vanc from 4/21 flagyl from 4/19  Objective: BP (!) 144/87 (BP Location: Left Arm)   Pulse 87   Temp 98.3 F (36.8 C)   Resp 19   Ht 5\' 5"  (1.651 m)   Wt 69.4 kg (153 lb)   SpO2 97%   BMI 25.46 kg/m   Intake/Output Summary (Last 24 hours) at 10/24/2017 1047 Last data filed at 10/23/2017 1833 Gross per 24 hour  Intake 370 ml  Output -  Net 370 ml   Filed Weights   10/22/17 0605 10/23/17 0541 10/24/17 0500  Weight: 74.6 kg (164 lb 7.4 oz) 74.1 kg (163 lb 5.8 oz) 69.4 kg (153 lb)    Exam: Patient is examined daily including today on 10/24/2017, exams remain the same as of yesterday except that has changed    General:  NAD, appear stronger, less hoarse  Cardiovascular: RRR  Respiratory: diminished at basis, no rhonchi, no wheezing  Abdomen: Soft/ND/NT, positive BS  Musculoskeletal: No Edema, 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3* 7.4* 7.3* 7.9*  MG 1.3*  --  1.2* 2.1  --  1.3* 1.8  --   PHOS 3.2  --   --   --   --    --   --   --    < > = values in this interval not displayed.   Liver Function Tests: Recent Labs  Lab 10/18/17 0857 10/19/17 0616 10/22/17 0553  AST 42* 38 34  ALT 36 30 10*  ALKPHOS 168* 143* 124  BILITOT 1.1 1.1 0.7  PROT 5.7* 5.3* 5.3*  ALBUMIN 2.3* 2.1* 2.1*   Recent Labs  Lab 10/19/17 0616 10/20/17 0353 10/21/17 0609 10/22/17 0553  LIPASE 64* 69* 59* 45   No results for input(s): AMMONIA in the last 168 hours. CBC: Recent Labs  Lab 10/18/17 0857 10/19/17 0616 10/20/17 0353 10/20/17 1605 10/21/17 0609 10/23/17 1553  WBC 15.4* 12.7* 10.5 7.5 6.7  5.4  NEUTROABS 11.8* 10.0* 8.5*  --  4.7 3.5  HGB 8.2* 7.5* 6.8* 8.5* 8.7* 8.5*  HCT 24.6* 22.3* 20.0* 24.7* 25.8* 25.9*  MCV 91.4 89.9 88.9 87.3 88.7 89.0  PLT 229 256 318 363 373 564*   Cardiac Enzymes:   No results for input(s): CKTOTAL, CKMB, CKMBINDEX, TROPONINI in the last 168 hours. BNP (last 3 results) No results for input(s): BNP in the last 8760 hours.  ProBNP (last 3 results) No results for input(s): PROBNP in the last 8760 hours.  CBG: No results for input(s): GLUCAP in the last 168 hours.  Recent Results (from the past 240 hour(s))  Culture, Urine     Status: Abnormal   Collection Time: 10/18/17  6:51 PM  Result Value Ref Range Status   Specimen Description   Final    URINE, RANDOM Performed at [facility], [address] [place], [place] [address]    Special Requests   Final    NONE Performed at [facility], [address] [place], [place] [address]    Culture (A)  Final    >=100,000 COLONIES/mL ENTEROBACTER CLOACAE 40,000 COLONIES/mL ESCHERICHIA COLI 40,000 COLONIES/mL ENTEROCOCCUS FAECALIS    Report Status 10/21/2017 FINAL  Final   Organism ID, Bacteria ENTEROBACTER CLOACAE (A)  Final   Organism ID, Bacteria ESCHERICHIA COLI (A)  Final   Organism ID, Bacteria ENTEROCOCCUS FAECALIS (A)  Final      Susceptibility   Enterobacter cloacae - MIC*      CEFAZOLIN >=64 RESISTANT Resistant     CEFTRIAXONE <=1 SENSITIVE Sensitive     CIPROFLOXACIN <=0.25 SENSITIVE Sensitive     GENTAMICIN <=1 SENSITIVE Sensitive     IMIPENEM 0.5 SENSITIVE Sensitive     NITROFURANTOIN 64 INTERMEDIATE Intermediate     TRIMETH/SULFA <=20 SENSITIVE Sensitive     PIP/TAZO <=4 SENSITIVE Sensitive     * >=100,000 COLONIES/mL ENTEROBACTER CLOACAE   Escherichia coli - MIC*    AMPICILLIN <=2 SENSITIVE Sensitive     CEFAZOLIN <=4 SENSITIVE Sensitive     CEFTRIAXONE <=1 SENSITIVE Sensitive     CIPROFLOXACIN <=0.25 SENSITIVE Sensitive     GENTAMICIN <=1 SENSITIVE Sensitive     IMIPENEM <=0.25 SENSITIVE Sensitive     NITROFURANTOIN <=16 SENSITIVE Sensitive     TRIMETH/SULFA <=20 SENSITIVE Sensitive     AMPICILLIN/SULBACTAM <=2 SENSITIVE Sensitive     PIP/TAZO <=4 SENSITIVE Sensitive     Extended ESBL NEGATIVE Sensitive     * 40,000 COLONIES/mL ESCHERICHIA COLI   Enterococcus faecalis - MIC*    AMPICILLIN <=2 SENSITIVE Sensitive     LEVOFLOXACIN 2 SENSITIVE Sensitive     NITROFURANTOIN <=16 SENSITIVE Sensitive     VANCOMYCIN 1 SENSITIVE Sensitive     * 40,000 COLONIES/mL ENTEROCOCCUS FAECALIS  Culture, blood (routine x 2)     Status: Abnormal   Collection Time: 10/19/17 10:02 AM  Result Value Ref Range Status   Specimen Description   Final    BLOOD RIGHT ANTECUBITAL Performed at [facility], [address] [place], [place] [address]    Special Requests   Final    BOTTLES DRAWN AEROBIC AND ANAEROBIC Blood Culture adequate volume Performed at [facility], [address] [place], [place] [address]    Culture  Setup Time   Final    GRAM POSITIVE COCCI IN BOTH AEROBIC AND ANAEROBIC BOTTLES CRITICAL RESULT CALLED TO, READ BACK BY AND VERIFIED WITH: B GREEN PHARMD 10/20/17 0405  JDW Performed at [facility], [address] [place], [place] [address]    Culture STAPHYLOCOCCUS AUREUS (A)  Final   Report Status  10/22/2017 FINAL  Final   Organism ID, Bacteria STAPHYLOCOCCUS AUREUS  Final      Susceptibility   Staphylococcus aureus - MIC*    CIPROFLOXACIN <=0.5 SENSITIVE Sensitive     ERYTHROMYCIN <=0.25 SENSITIVE Sensitive     GENTAMICIN <=0.5 SENSITIVE Sensitive     OXACILLIN <=0.25 SENSITIVE Sensitive     TETRACYCLINE <=1 SENSITIVE Sensitive     VANCOMYCIN 1 SENSITIVE Sensitive     TRIMETH/SULFA <=10 SENSITIVE Sensitive     CLINDAMYCIN <=0.25 SENSITIVE Sensitive     RIFAMPIN <=0.5 SENSITIVE Sensitive     Inducible Clindamycin NEGATIVE Sensitive     * STAPHYLOCOCCUS AUREUS  Blood Culture ID Panel (Reflexed)     Status: Abnormal   Collection Time: 10/19/17 10:02 AM  Result Value Ref Range Status   Enterococcus species NOT DETECTED NOT DETECTED Final   Listeria monocytogenes NOT DETECTED NOT DETECTED Final   Staphylococcus species DETECTED (A) NOT DETECTED Final    Comment: CRITICAL RESULT CALLED TO, READ BACK BY AND VERIFIED WITH: B GREEN PHARMD 10/20/17 0405 JDW    Staphylococcus aureus DETECTED (A) NOT DETECTED Final    Comment: Methicillin (oxacillin) susceptible Staphylococcus aureus (MSSA). Preferred therapy is anti staphylococcal beta lactam antibiotic (Cefazolin or Nafcillin), unless clinically contraindicated. CRITICAL RESULT CALLED TO, READ BACK BY AND VERIFIED WITH: B GREEN PHARMD 10/20/17 0405 JDW    Methicillin resistance NOT DETECTED NOT DETECTED Final   Streptococcus species NOT DETECTED NOT DETECTED Final   Streptococcus agalactiae NOT DETECTED NOT DETECTED Final   Streptococcus pneumoniae NOT DETECTED NOT DETECTED Final   Streptococcus pyogenes NOT DETECTED NOT DETECTED Final   Acinetobacter baumannii NOT DETECTED NOT DETECTED Final   Enterobacteriaceae species NOT DETECTED NOT DETECTED Final   Enterobacter cloacae complex NOT DETECTED NOT DETECTED Final   Escherichia coli NOT DETECTED NOT DETECTED Final   Klebsiella oxytoca NOT DETECTED NOT DETECTED Final   Klebsiella  pneumoniae NOT DETECTED NOT DETECTED Final   Proteus species NOT DETECTED NOT DETECTED Final   Serratia marcescens NOT DETECTED NOT DETECTED Final   Haemophilus influenzae NOT DETECTED NOT DETECTED Final   Neisseria meningitidis NOT DETECTED NOT DETECTED Final   Pseudomonas aeruginosa NOT DETECTED NOT DETECTED Final   Candida albicans NOT DETECTED NOT DETECTED Final   Candida glabrata NOT DETECTED NOT DETECTED Final   Candida krusei NOT DETECTED NOT DETECTED Final   Candida parapsilosis NOT DETECTED NOT DETECTED Final   Candida tropicalis NOT DETECTED NOT DETECTED Final  Culture, blood (routine x 2)     Status: Abnormal   Collection Time: 10/19/17 10:07 AM  Result Value Ref Range Status   Specimen Description   Final    BLOOD LEFT ANTECUBITAL Performed at [facility], [address] [place], [place] [address]    Special Requests   Final    BOTTLES DRAWN AEROBIC AND ANAEROBIC Blood Culture adequate volume Performed at [facility], [address] [place], [place] [address]    Culture  Setup Time   Final    GRAM POSITIVE COCCI IN BOTH AEROBIC AND ANAEROBIC BOTTLES CRITICAL RESULT CALLED TO, READ BACK BY AND VERIFIED WITH: B GREEN PHARMD 10/20/17 0405 JDW    Culture (A)  Final    STAPHYLOCOCCUS AUREUS SUSCEPTIBILITIES PERFORMED ON PREVIOUS CULTURE WITHIN THE LAST 5 DAYS. Performed at [facility], [address], [place], [place] [address]    Report Status 10/22/2017 FINAL  Final  Culture, blood (routine x 2)     Status: None (Preliminary result)   Collection Time: 10/21/17  6:09 AM  Result Value Ref Range Status   Specimen Description   Final    BLOOD RIGHT ANTECUBITAL Performed at [facility], [address] [place], [place] [address]    Special Requests   Final    BOTTLES DRAWN AEROBIC ONLY Blood Culture adequate volume Performed at [facility], [address], [place], [place] [address]     Culture   Final    NO GROWTH 2 DAYS Performed at [facility], [address] [place], [place] [address]    Report Status PENDING  Incomplete  Culture, blood (routine x 2)     Status: None (Preliminary result)   Collection Time: 10/21/17  6:09 AM  Result Value Ref Range Status   Specimen Description   Final    BLOOD LEFT HAND Performed at [facility], [address] [place], [place] [address]    Special Requests   Final    BOTTLES DRAWN AEROBIC ONLY Blood Culture results may not be optimal due to an inadequate volume of blood received in culture bottles Performed at [facility], [address] [place], [place] [address]    Culture   Final    NO GROWTH 2 DAYS Performed at [facility], [address], [place], [place] [address]    Report Status PENDING  Incomplete     Studies: No results found.  Scheduled Meds: . Chlorhexidine Gluconate Cloth  6 each Topical Daily  . folic acid  1 mg Oral Daily  . guaiFENesin  600 mg Oral BID  . hydrocortisone cream   Topical BID  . ipratropium-albuterol  3 mL Nebulization Once  . mouth rinse  15 mL Mouth Rinse BID  . multivitamin with minerals  1 tablet Oral Daily  . pantoprazole  40 mg Oral Daily  . pneumococcal 23 valent vaccine  0.5 mL Intramuscular Tomorrow-1000  . potassium chloride  40 mEq Oral Once  . sodium chloride flush  10-40 mL Intracatheter Q12H    Continuous Infusions: . sodium chloride Stopped (10/22/17 2210)  . vancomycin 1,250 mg (10/24/17 0925)     Time spent: I have personally reviewed and interpreted on  10/24/2017 daily labs, tele strips, imagings as discussed above under date review session and assessment and plans.  I reviewed all nursing notes, pharmacy notes, consultant notes,  vitals, pertinent old records  I have discussed plan of care as described above with RN , patient  on 10/24/2017   Albertine Grates MD, PhD  [facility] Pager  [phone]. If 7PM-7AM, please contact night-coverage at [email or web address], password [facility] 10/24/2017, 10:47 AM  LOS: 11 days

## 2017-10-24 NOTE — Progress Notes (Signed)
[facility] aware to have patient to [facility] by 1300 for scheduled TEE at 1400 on 10/24/17. Onalee HuaDavid, RN [phone]) aware to send consent and patient stickers with patient for procedure. Brt, rn

## 2017-10-24 NOTE — Progress Notes (Signed)
  Echocardiogram Echocardiogram Transesophageal has been performed.  Ashley Bradley 10/24/2017, 4:34 PM

## 2017-10-24 NOTE — Care Management Important Message (Signed)
Important Message  Patient Details  Name: Ashley BowensDarlene S Tuff MRN: [id number] Date of Birth: 1950-10-18   Medicare Important Message Given:  Yes    Caren MacadamFuller, Feven Alderfer 10/24/2017, 1:08 PMImportant Message  Patient Details  Name: Ashley BowensDarlene S Noblett MRN: [id number] Date of Birth: 1950-10-18   Medicare Important Message Given:  Yes    Caren MacadamFuller, Toretto Tingler 10/24/2017, 1:08 PM

## 2017-10-24 NOTE — Interval H&P Note (Signed)
History and Physical Interval Note: No changes. Will proceed with TEE as planned.  10/24/2017 1:13 PM  Carolan Kathi DerS Schwer  has presented today for surgery, with the diagnosis of bacteremia  The various methods of treatment have been discussed with the patient and family. After consideration of risks, benefits and other options for treatment, the patient has consented to  Procedure(s): TRANSESOPHAGEAL ECHOCARDIOGRAM (TEE) (N/A) as a surgical intervention .  The patient's history has been reviewed, patient examined, no change in status, stable for surgery.  I have reviewed the patient's chart and labs.  Questions were answered to the patient's satisfaction.     Prentice DockerSuresh Janei Scheff

## 2017-10-25 ENCOUNTER — Encounter (HOSPITAL_COMMUNITY): Payer: Self-pay | Admitting: Cardiovascular Disease

## 2017-10-25 ENCOUNTER — Inpatient Hospital Stay: Payer: Self-pay

## 2017-10-25 DIAGNOSIS — J9601 Acute respiratory failure with hypoxia: Secondary | ICD-10-CM

## 2017-10-25 LAB — BASIC METABOLIC PANEL
Anion gap: 8 (ref 5–15)
CALCIUM: 7.9 mg/dL — AB (ref 8.9–10.3)
CO2: 21 mmol/L — AB (ref 22–32)
CREATININE: 0.56 mg/dL (ref 0.44–1.00)
Chloride: 111 mmol/L (ref 101–111)
GFR calc non Af Amer: >60 mL/min (ref >60)
Glucose, Bld: 89 mg/dL (ref 65–99)
Potassium: 3.5 mmol/L (ref 3.5–5.1)
SODIUM: 140 mmol/L (ref 135–145)

## 2017-10-25 LAB — CBC WITH DIFFERENTIAL/PLATELET
Basophils Absolute: 0.2 10*3/uL — ABNORMAL HIGH (ref 0.0–0.1)
Basophils Relative: 3 %
EOS PCT: 2 %
Eosinophils Absolute: 0.1 10*3/uL (ref 0.0–0.7)
HEMATOCRIT: 25.7 % — AB (ref 36.0–46.0)
Hemoglobin: 8.5 g/dL — ABNORMAL LOW (ref 12.0–15.0)
LYMPHS ABS: 1.8 10*3/uL (ref 0.7–4.0)
Lymphocytes Relative: 33 %
MCH: 30.2 pg (ref 26.0–34.0)
MCHC: 33.1 g/dL (ref 30.0–36.0)
MCV: 91.5 fL (ref 78.0–100.0)
MONO ABS: 0.8 10*3/uL (ref 0.1–1.0)
MONOS PCT: 14 %
NEUTROS ABS: 2.7 10*3/uL (ref 1.7–7.7)
Neutrophils Relative %: 48 %
PLATELETS: 653 10*3/uL — AB (ref 150–400)
RBC: 2.81 MIL/uL — ABNORMAL LOW (ref 3.87–5.11)
RDW: 17.6 % — AB (ref 11.5–15.5)
WBC: 5.6 10*3/uL (ref 4.0–10.5)

## 2017-10-25 LAB — MAGNESIUM: MAGNESIUM: 1.4 mg/dL — AB (ref 1.7–2.4)

## 2017-10-25 MED ORDER — MAGNESIUM SULFATE 2 GM/50ML IV SOLN
2.0000 g | Freq: Once | INTRAVENOUS | Status: AC
  Administered 2017-10-25: 2 g via INTRAVENOUS
  Filled 2017-10-25: qty 50

## 2017-10-25 MED ORDER — LOSARTAN POTASSIUM 50 MG PO TABS
50.0000 mg | ORAL_TABLET | Freq: Every day | ORAL | Status: DC
  Administered 2017-10-25 – 2017-10-26 (×2): 50 mg via ORAL
  Filled 2017-10-25 (×2): qty 1

## 2017-10-25 NOTE — Progress Notes (Addendum)
PHARMACY CONSULT NOTE FOR:  OUTPATIENT  PARENTERAL ANTIBIOTIC THERAPY (OPAT)  Indication: bacteremia Regimen: vancomycin 1250 mg IV q24h End date: 11/03/17   IV antibiotic discharge orders are pended. To discharging provider:  please sign these orders via discharge navigator,  Select New Orders & click on the button choice - Manage This Unsigned Work.     Thank you for allowing pharmacy to be a part of this patient's care.    Adalberto ColeNikola Tirrell Buchberger, PharmD, BCPS Pager [phone] 10/25/2017 10:06 AM

## 2017-10-25 NOTE — Progress Notes (Signed)
    [facility]   Reason for visit: Follow up on MSSA bacteremia  Interval History: TEE negative for vegetation  Physical Exam: Constitutional:  Vitals:   10/24/17 1958 10/25/17 0614  BP: 128/74 (!) 154/79  Pulse: 96 84  Resp: (!) 22 20  Temp: 97.9 F (36.6 C) 98.9 F (37.2 C)  SpO2: 93% 98%   patient appears in NAD  Impression: bacteremia  Plan: 1. IV vancomycin for 2 weeks through May 3 2. Picc line - I will order 3.  OPAT consult - I will order I will arrange follow up with me in about 2 weeks. thanks

## 2017-10-25 NOTE — Progress Notes (Signed)
Patient ID: Ashley Bradley, female   DOB: 07-Sep-1950, 67 y.o.   MRN: [id number]  PROGRESS NOTE    Ashley Bradley  JXB:[id number] DOB: 04-27-1951 DOA: 10/13/2017 PCP: Sigmund Hazel, MD   Brief Narrative:  67 year old female with history of alcohol abuse, depression, hypertension, alcohol related seizures and prolonged QT presented with altered mental status and was admitted on 10/13/2017.  There was a question of GI bleeding as well.  Patient was hypotensive, tachypneic and tachycardic.  She was initially admitted under the critical care team.  She required intubation.  She was also found to have acute kidney injury and hypovolemic shock.  She required CVVHD and pressors.  She was extubated on 10/15/2017.  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  She spiked fever on 10/19/2017 and was found to have MSSA bacteremia and  pneumonia and was started on antibiotics.  ID was consulted.  TEE on 10/24/2017 was negative.  Repeat cultures have been negative.   Assessment & Plan:   Active Problems:   AKI (acute kidney injury) (HCC)   High anion gap metabolic acidosis   Acute metabolic encephalopathy   Bacteremia   MSSA bacteremia - Repeat cultures from 10/21/2017 have been negative so far. -Patient was initially started on Unasyn on 10/19/2017 which was changed to Ancef plus Flagyl on 10/20/2017.  Due to skin peeling on hands and feet, Ancef was changed to vancomycin as per ID recommendations. -TEE on 10/21/2017 was negative for vegetations -ID recommends vancomycin for 2 weeks till 11/03/2017  -PICC line to be placed today  Right lower lobe pneumonia with concern for aspiration -Aspiration precautions.  Diet as per SLP recommendations -Antibiotic plan as above -Respiratory status stable.  Acute hypoxic respiratory failure -Status post intubation and extubation.  Currently on room air.  Respiratory status  stable  Hypovolemic shock -Resolved.  Hemodynamically stable.  Initially required pressors with CRRT  Acute kidney injury -Resolved.  Off CRRT.  Renal function stable  Probable upper GI bleeding -Resolved.  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  Hemoglobin stable.  Repeat a.m. Labs  Normocytic anemia -Hemoglobin stable.  Repeat a.m. labs.  No signs of bleeding currently -Status post PRBC transfusion x1 on 10/20/2017 -Continue Protonix.  Acute metabolic/toxic encephalopathy -Present on admission.  Required intubation -Status post extubation -Improved. -Mental status stable  Hypomagnesemia -Replace.  Repeat a.m. Labs  Elevated LFTs -Probably secondary to alcohol abuse.  Improving.  Outpatient follow-up.  Hepatitis panel negative  Alcohol abuse -Outpatient follow-up.  Continue multivitamin and folate.  Add thiamine  History of hypertension -Blood pressure intermittently elevated.  Will resume losartan.  Monitor renal function  DVT prophylaxis: SCDs Code Status: Full Family Communication: None at bedside Disposition Plan: Home probably tomorrow  Consultants: Critical care/GI/nephrology/ID/cardiology  Procedures:   CRRT  Intubation/extubation  IJ placement and removal  EGD on 4/20   Antimicrobials:  Anti-infectives (From admission, onward)   Start     Dose/Rate Route Frequency Ordered Stop   10/23/17 1200  vancomycin (VANCOCIN) IVPB 1000 mg/200 mL premix  Status:  Discontinued     1,000 mg 200 mL/hr over 60 Minutes Intravenous Every 24 hours 10/22/17 1154 10/23/17 0803   10/23/17 1000  vancomycin (VANCOCIN) 1,250 mg in sodium chloride 0.9 % 250 mL IVPB     1,250 mg 166.7 mL/hr over 90 Minutes Intravenous Every 24 hours 10/23/17 0803     10/22/17 1230  vancomycin (VANCOCIN) 1,500 mg in sodium chloride 0.9 % 500 mL IVPB     1,500 mg 250 mL/hr over 120 Minutes Intravenous  Once 10/22/17 1149 10/22/17 1707   10/20/17 1030   metroNIDAZOLE (FLAGYL) tablet 500 mg     500 mg Oral Every 8 hours 10/20/17 1013 10/23/17 2359   10/20/17 0500  ceFAZolin (ANCEF) IVPB 2g/100 mL premix  Status:  Discontinued     2 g 200 mL/hr over 30 Minutes Intravenous Every 8 hours 10/20/17 0437 10/22/17 1138   10/19/17 1000  Ampicillin-Sulbactam (UNASYN) 3 g in sodium chloride 0.9 % 100 mL IVPB  Status:  Discontinued     3 g 200 mL/hr over 30 Minutes Intravenous Every 12 hours 10/19/17 0859 10/20/17 0437        Subjective: Patient seen and examined at bedside.  She denies overnight fever, nausea, vomiting.  She feels weak.  Objective: Vitals:   10/24/17 1510 10/24/17 1958 10/25/17 0500 10/25/17 0614  BP: (!) 129/57 128/74  (!) 154/79  Pulse: (!) 105 96  84  Resp: (!) 28 (!) 22  20  Temp: 98.5 F (36.9 C) 97.9 F (36.6 C)  98.9 F (37.2 C)  TempSrc: Oral Oral  Oral  SpO2: 95% 93%  98%  Weight:   69.2 kg (152 lb 8.9 oz)   Height:        Intake/Output Summary (Last 24 hours) at 10/25/2017 1121 Last data filed at 10/24/2017 1821 Gross per 24 hour  Intake 301.33 ml  Output -  Net 301.33 ml   Filed Weights   10/23/17 0541 10/24/17 0500 10/25/17 0500  Weight: 74.1 kg (163 lb 5.8 oz) 69.4 kg (153 lb) 69.2 kg (152 lb 8.9 oz)    Examination:  General exam: Appears calm and comfortable Respiratory system: Bilateral decreased breath sound at bases Cardiovascular system: S1 & S2 heard, rate controlled  gastrointestinal system: Abdomen is nondistended, soft and nontender. Normal bowel sounds heard. Central nervous system: Alert and oriented. No focal neurological deficits. Moving extremities Extremities: No cyanosis, clubbing; trace edema Skin: No rashes, lesions or ulcers Psychiatry: Judgement and insight appear normal. Mood & affect appropriate.     Data Reviewed: I have personally reviewed following labs and imaging studies  CBC: Recent Labs  Lab 10/19/17 0616 10/20/17 0353 10/20/17 1605 10/21/17 0609  10/23/17 1553  WBC 12.7* 10.5 7.5 6.7 5.4  NEUTROABS 10.0* 8.5*  --  4.7 3.5  HGB 7.5* 6.8* 8.5* 8.7* 8.5*  HCT 22.3* 20.0* 24.7* 25.8* 25.9*  MCV 89.9 88.9 87.3 88.7 89.0  PLT 256 318 363 373 564*   Basic Metabolic Panel: Recent Labs  Lab 10/20/17 0353 10/20/17 1605 10/21/17 0609 10/22/17 0553 10/23/17 0618 10/24/17 0613 10/25/17 0612  NA 136 137 138 139 139 143 140  K 2.7* 2.8* 3.7 2.8* 3.7 3.3* 3.5  CL 90* 94* 98* 102 110 112* 111  CO2 33* 31 29 26 23 23  21*  GLUCOSE 112* 89 94 114* 92 111* 89  BUN 31* 26* 20 13 9 6  <5*  CREATININE 1.66* 1.48* 1.27* 0.99 0.69 0.65 0.56  CALCIUM 7.4* 7.2* 7.3* 7.4* 7.3* 7.9* 7.9*  MG 1.2* 2.1  --  1.3* 1.8  --  1.4*   GFR: Estimated Creatinine Clearance: 65.8 mL/min (by C-G formula based on SCr of 0.56 mg/dL). Liver Function Tests: Recent Labs  Lab 10/19/17 0616 10/22/17 0553  AST 38 34  ALT 30 10*  ALKPHOS 143* 124  BILITOT 1.1 0.7  PROT  5.3* 5.3*  ALBUMIN 2.1* 2.1*   Recent Labs  Lab 10/19/17 0616 10/20/17 0353 10/21/17 0609 10/22/17 0553  LIPASE 64* 69* 59* 45   No results for input(s): AMMONIA in the last 168 hours. Coagulation Profile: No results for input(s): INR, PROTIME in the last 168 hours. Cardiac Enzymes: No results for input(s): CKTOTAL, CKMB, CKMBINDEX, TROPONINI in the last 168 hours. BNP (last 3 results) No results for input(s): PROBNP in the last 8760 hours. HbA1C: No results for input(s): HGBA1C in the last 72 hours. CBG: No results for input(s): GLUCAP in the last 168 hours. Lipid Profile: No results for input(s): CHOL, HDL, LDLCALC, TRIG, CHOLHDL, LDLDIRECT in the last 72 hours. Thyroid Function Tests: No results for input(s): TSH, T4TOTAL, FREET4, T3FREE, THYROIDAB in the last 72 hours. Anemia Panel: No results for input(s): VITAMINB12, FOLATE, FERRITIN, TIBC, IRON, RETICCTPCT in the last 72 hours. Sepsis Labs: Recent Labs  Lab 10/18/17 2040 10/19/17 0616 10/20/17 0353  PROCALCITON 0.83  0.62 0.67  LATICACIDVEN 0.7 0.8  --     Recent Results (from the past 240 hour(s))  Culture, Urine     Status: Abnormal   Collection Time: 10/18/17  6:51 PM  Result Value Ref Range Status   Specimen Description   Final    URINE, RANDOM Performed at [facility], [address] [place], [place] [address]    Special Requests   Final    NONE Performed at [facility], [address] [place], [place] [address]    Culture (A)  Final    >=100,000 COLONIES/mL ENTEROBACTER CLOACAE 40,000 COLONIES/mL ESCHERICHIA COLI 40,000 COLONIES/mL ENTEROCOCCUS FAECALIS    Report Status 10/21/2017 FINAL  Final   Organism ID, Bacteria ENTEROBACTER CLOACAE (A)  Final   Organism ID, Bacteria ESCHERICHIA COLI (A)  Final   Organism ID, Bacteria ENTEROCOCCUS FAECALIS (A)  Final      Susceptibility   Enterobacter cloacae - MIC*    CEFAZOLIN >=64 RESISTANT Resistant     CEFTRIAXONE <=1 SENSITIVE Sensitive     CIPROFLOXACIN <=0.25 SENSITIVE Sensitive     GENTAMICIN <=1 SENSITIVE Sensitive     IMIPENEM 0.5 SENSITIVE Sensitive     NITROFURANTOIN 64 INTERMEDIATE Intermediate     TRIMETH/SULFA <=20 SENSITIVE Sensitive     PIP/TAZO <=4 SENSITIVE Sensitive     * >=100,000 COLONIES/mL ENTEROBACTER CLOACAE   Escherichia coli - MIC*    AMPICILLIN <=2 SENSITIVE Sensitive     CEFAZOLIN <=4 SENSITIVE Sensitive     CEFTRIAXONE <=1 SENSITIVE Sensitive     CIPROFLOXACIN <=0.25 SENSITIVE Sensitive     GENTAMICIN <=1 SENSITIVE Sensitive     IMIPENEM <=0.25 SENSITIVE Sensitive     NITROFURANTOIN <=16 SENSITIVE Sensitive     TRIMETH/SULFA <=20 SENSITIVE Sensitive     AMPICILLIN/SULBACTAM <=2 SENSITIVE Sensitive     PIP/TAZO <=4 SENSITIVE Sensitive     Extended ESBL NEGATIVE Sensitive     * 40,000 COLONIES/mL ESCHERICHIA COLI   Enterococcus faecalis - MIC*    AMPICILLIN <=2 SENSITIVE Sensitive     LEVOFLOXACIN 2 SENSITIVE Sensitive     NITROFURANTOIN <=16 SENSITIVE Sensitive      VANCOMYCIN 1 SENSITIVE Sensitive     * 40,000 COLONIES/mL ENTEROCOCCUS FAECALIS  Culture, blood (routine x 2)     Status: Abnormal   Collection Time: 10/19/17 10:02 AM  Result Value Ref Range Status   Specimen Description   Final    BLOOD RIGHT ANTECUBITAL Performed at [facility], [address] [place], [place] [address]    Special Requests   Final    BOTTLES DRAWN AEROBIC AND ANAEROBIC Blood Culture adequate volume Performed at [facility], [address] [place], [place] [address]    Culture  Setup Time   Final    GRAM POSITIVE COCCI IN BOTH AEROBIC AND ANAEROBIC BOTTLES CRITICAL RESULT CALLED TO, READ BACK BY AND VERIFIED WITH: B GREEN PHARMD 10/20/17 0405 JDW Performed at [facility], [address], [place], [place] [address]    Culture STAPHYLOCOCCUS AUREUS (A)  Final   Report Status 10/22/2017 FINAL  Final   Organism ID, Bacteria STAPHYLOCOCCUS AUREUS  Final      Susceptibility   Staphylococcus aureus - MIC*    CIPROFLOXACIN <=0.5 SENSITIVE Sensitive     ERYTHROMYCIN <=0.25 SENSITIVE Sensitive     GENTAMICIN <=0.5 SENSITIVE Sensitive     OXACILLIN <=0.25 SENSITIVE Sensitive     TETRACYCLINE <=1 SENSITIVE Sensitive     VANCOMYCIN 1 SENSITIVE Sensitive     TRIMETH/SULFA <=10 SENSITIVE Sensitive     CLINDAMYCIN <=0.25 SENSITIVE Sensitive     RIFAMPIN <=0.5 SENSITIVE Sensitive     Inducible Clindamycin NEGATIVE Sensitive     * STAPHYLOCOCCUS AUREUS  Blood Culture ID Panel (Reflexed)     Status: Abnormal   Collection Time: 10/19/17 10:02 AM  Result Value Ref Range Status   Enterococcus species NOT DETECTED NOT DETECTED Final   Listeria monocytogenes NOT DETECTED NOT DETECTED Final   Staphylococcus species DETECTED (A) NOT DETECTED Final    Comment: CRITICAL RESULT CALLED TO, READ BACK BY AND VERIFIED WITH: B GREEN PHARMD 10/20/17 0405 JDW    Staphylococcus aureus DETECTED (A) NOT DETECTED Final    Comment: Methicillin  (oxacillin) susceptible Staphylococcus aureus (MSSA). Preferred therapy is anti staphylococcal beta lactam antibiotic (Cefazolin or Nafcillin), unless clinically contraindicated. CRITICAL RESULT CALLED TO, READ BACK BY AND VERIFIED WITH: B GREEN PHARMD 10/20/17 0405 JDW    Methicillin resistance NOT DETECTED NOT DETECTED Final   Streptococcus species NOT DETECTED NOT DETECTED Final   Streptococcus agalactiae NOT DETECTED NOT DETECTED Final   Streptococcus pneumoniae NOT DETECTED NOT DETECTED Final   Streptococcus pyogenes NOT DETECTED NOT DETECTED Final   Acinetobacter baumannii NOT DETECTED NOT DETECTED Final   Enterobacteriaceae species NOT DETECTED NOT DETECTED Final   Enterobacter cloacae complex NOT DETECTED NOT DETECTED Final   Escherichia coli NOT DETECTED NOT DETECTED Final   Klebsiella oxytoca NOT DETECTED NOT DETECTED Final   Klebsiella pneumoniae NOT DETECTED NOT DETECTED Final   Proteus species NOT DETECTED NOT DETECTED Final   Serratia marcescens NOT DETECTED NOT DETECTED Final   Haemophilus influenzae NOT DETECTED NOT DETECTED Final   Neisseria meningitidis NOT DETECTED NOT DETECTED Final   Pseudomonas aeruginosa NOT DETECTED NOT DETECTED Final   Candida albicans NOT DETECTED NOT DETECTED Final   Candida glabrata NOT DETECTED NOT DETECTED Final   Candida krusei NOT DETECTED NOT DETECTED Final   Candida parapsilosis NOT DETECTED NOT DETECTED Final   Candida tropicalis NOT DETECTED NOT DETECTED Final  Culture, blood (routine x 2)     Status: Abnormal   Collection Time: 10/19/17 10:07 AM  Result Value Ref Range Status   Specimen Description   Final    BLOOD LEFT ANTECUBITAL Performed at [facility], [address], [place], [place] [address]    Special Requests   Final    BOTTLES DRAWN AEROBIC AND ANAEROBIC Blood Culture adequate volume Performed  at [facility], [address], [place], [place] [address]    Culture  Setup Time    Final    GRAM POSITIVE COCCI IN BOTH AEROBIC AND ANAEROBIC BOTTLES CRITICAL RESULT CALLED TO, READ BACK BY AND VERIFIED WITH: B GREEN PHARMD 10/20/17 0405 JDW    Culture (A)  Final    STAPHYLOCOCCUS AUREUS SUSCEPTIBILITIES PERFORMED ON PREVIOUS CULTURE WITHIN THE LAST 5 DAYS. Performed at [facility], [address], [place], [place] [address]    Report Status 10/22/2017 FINAL  Final  Culture, blood (routine x 2)     Status: None (Preliminary result)   Collection Time: 10/21/17  6:09 AM  Result Value Ref Range Status   Specimen Description   Final    BLOOD RIGHT ANTECUBITAL Performed at [facility], [address] [place] [address], [place], [place] [address]    Special Requests   Final    BOTTLES DRAWN AEROBIC ONLY Blood Culture adequate volume Performed at [facility], [address] [place], [place] [address]    Culture   Final    NO GROWTH 3 DAYS Performed at [facility], [address], [place], [place] [address]    Report Status PENDING  Incomplete  Culture, blood (routine x 2)     Status: None (Preliminary result)   Collection Time: 10/21/17  6:09 AM  Result Value Ref Range Status   Specimen Description   Final    BLOOD LEFT HAND Performed at [facility], [address], [place], [place] [address]    Special Requests   Final    BOTTLES DRAWN AEROBIC ONLY Blood Culture results may not be optimal due to an inadequate volume of blood received in culture bottles Performed at [facility], [address] [place], [place] [address]    Culture   Final    NO GROWTH 3 DAYS Performed at [facility], [address], [place], [place] [address]    Report Status PENDING  Incomplete         Radiology Studies: [place] Ekg Site Rite  Result Date: 10/25/2017 If Site Rite image not attached, placement could not be confirmed due to current cardiac rhythm.       Scheduled Meds: . Chlorhexidine  Gluconate Cloth  6 each Topical Daily  . folic acid  1 mg Oral Daily  . guaiFENesin  600 mg Oral BID  . hydrocortisone cream   Topical BID  . ipratropium-albuterol  3 mL Nebulization Once  . losartan  50 mg Oral Daily  . mouth rinse  15 mL Mouth Rinse BID  . multivitamin with minerals  1 tablet Oral Daily  . pantoprazole  40 mg Oral Daily  . pneumococcal 23 valent vaccine  0.5 mL Intramuscular Tomorrow-1000  . sodium chloride flush  10-40 mL Intracatheter Q12H   Continuous Infusions: . vancomycin Stopped (10/25/17 1054)     LOS: 12 days        Glade Lloyd, MD [facility] Pager [phone]  If 7PM-7AM, please contact night-coverage [email or web address] Password [facility] 10/25/2017, 11:21 AM

## 2017-10-25 NOTE — Progress Notes (Signed)
[id number]/Kary Sugrue,BSN,RN3,CCM:[phone]/ [facility].

## 2017-10-26 DIAGNOSIS — J13 Pneumonia due to Streptococcus pneumoniae: Secondary | ICD-10-CM

## 2017-10-26 DIAGNOSIS — G9341 Metabolic encephalopathy: Secondary | ICD-10-CM

## 2017-10-26 LAB — GASTROINTESTINAL PANEL BY PCR, STOOL (REPLACES STOOL CULTURE)

## 2017-10-26 LAB — C DIFFICILE QUICK SCREEN W PCR REFLEX
C DIFFICILE (CDIFF) INTERP: NOT DETECTED
C DIFFICILE (CDIFF) TOXIN: NEGATIVE
C DIFFICLE (CDIFF) ANTIGEN: NEGATIVE

## 2017-10-26 LAB — COMPREHENSIVE METABOLIC PANEL
ALT: 23 U/L (ref 14–54)
AST: 45 U/L — ABNORMAL HIGH (ref 15–41)
Albumin: 2.8 g/dL — ABNORMAL LOW (ref 3.5–5.0)
Alkaline Phosphatase: 128 U/L — ABNORMAL HIGH (ref 38–126)
Anion gap: 10 (ref 5–15)
BILIRUBIN TOTAL: 0.5 mg/dL (ref 0.3–1.2)
BUN: <5 mg/dL — ABNORMAL LOW (ref 6–20)
CO2: 20 mmol/L — ABNORMAL LOW (ref 22–32)
CREATININE: 0.64 mg/dL (ref 0.44–1.00)
Calcium: 8.1 mg/dL — ABNORMAL LOW (ref 8.9–10.3)
Chloride: 112 mmol/L — ABNORMAL HIGH (ref 101–111)
Glucose, Bld: 121 mg/dL — ABNORMAL HIGH (ref 65–99)
POTASSIUM: 3.1 mmol/L — AB (ref 3.5–5.1)
Sodium: 142 mmol/L (ref 135–145)
TOTAL PROTEIN: 6.1 g/dL — AB (ref 6.5–8.1)

## 2017-10-26 LAB — CBC WITH DIFFERENTIAL/PLATELET
BASOS PCT: 3 %
Basophils Absolute: 0.2 10*3/uL — ABNORMAL HIGH (ref 0.0–0.1)
EOS PCT: 1 %
Eosinophils Absolute: 0.1 10*3/uL (ref 0.0–0.7)
HCT: 27.5 % — ABNORMAL LOW (ref 36.0–46.0)
Hemoglobin: 8.9 g/dL — ABNORMAL LOW (ref 12.0–15.0)
LYMPHS ABS: 1.4 10*3/uL (ref 0.7–4.0)
Lymphocytes Relative: 27 %
MCH: 30.1 pg (ref 26.0–34.0)
MCHC: 32.4 g/dL (ref 30.0–36.0)
MCV: 92.9 fL (ref 78.0–100.0)
Monocytes Absolute: 0.6 10*3/uL (ref 0.1–1.0)
Monocytes Relative: 12 %
NEUTROS ABS: 2.9 10*3/uL (ref 1.7–7.7)
Neutrophils Relative %: 57 %
Platelets: 642 10*3/uL — ABNORMAL HIGH (ref 150–400)
RBC: 2.96 MIL/uL — ABNORMAL LOW (ref 3.87–5.11)
RDW: 17.7 % — AB (ref 11.5–15.5)
WBC: 5.2 10*3/uL (ref 4.0–10.5)

## 2017-10-26 LAB — MAGNESIUM: MAGNESIUM: 1.6 mg/dL — AB (ref 1.7–2.4)

## 2017-10-26 LAB — CULTURE, BLOOD (ROUTINE X 2)
CULTURE: NO GROWTH
Culture: NO GROWTH
Special Requests: ADEQUATE

## 2017-10-26 MED ORDER — SODIUM CHLORIDE 0.9% FLUSH
10.0000 mL | INTRAVENOUS | Status: DC | PRN
  Administered 2017-10-26: 10 mL
  Filled 2017-10-26: qty 40

## 2017-10-26 MED ORDER — VANCOMYCIN IV (FOR PTA / DISCHARGE USE ONLY): 1250.0000 mg | INTRAVENOUS | 0 refills | Status: DC

## 2017-10-26 MED ORDER — MAGNESIUM SULFATE 2 GM/50ML IV SOLN
2.0000 g | Freq: Once | INTRAVENOUS | Status: AC
  Administered 2017-10-26: 2 g via INTRAVENOUS

## 2017-10-26 MED ORDER — LOPERAMIDE HCL 2 MG PO CAPS: 2.0000 mg | ORAL_CAPSULE | Freq: Three times a day (TID) | ORAL | 0 refills | Status: DC | PRN

## 2017-10-26 MED ORDER — PANTOPRAZOLE SODIUM 40 MG PO TBEC: 40.0000 mg | DELAYED_RELEASE_TABLET | Freq: Every day | ORAL | 0 refills | Status: DC

## 2017-10-26 MED ORDER — HEPARIN SOD (PORK) LOCK FLUSH 100 UNIT/ML IV SOLN
250.0000 [IU] | INTRAVENOUS | Status: AC | PRN
  Administered 2017-10-26: 250 [IU]

## 2017-10-26 MED ORDER — THIAMINE HCL 100 MG PO TABS: 100.0000 mg | ORAL_TABLET | Freq: Every day | ORAL | 0 refills | Status: DC

## 2017-10-26 MED ORDER — POTASSIUM CHLORIDE CRYS ER 10 MEQ PO TBCR
40.0000 meq | EXTENDED_RELEASE_TABLET | ORAL | Status: DC
  Administered 2017-10-26: 40 meq via ORAL
  Filled 2017-10-26: qty 4

## 2017-10-26 MED ORDER — FOLIC ACID 1 MG PO TABS: 1.0000 mg | ORAL_TABLET | Freq: Every day | ORAL | 0 refills | Status: DC

## 2017-10-26 NOTE — Progress Notes (Signed)
PT Cancellation Note  Patient Details Name: Ashley Bradley MRN: [id number] DOB: 1951/05/17   Cancelled Treatment:    Reason Eval/Treat Not Completed: Other (comment) Pt reports she just had PICC line placed and wishes to wait until free from IV pole prior to ambulating.  Pt states she ambulated around room with nursing yesterday and is feeling stronger.  Will check back as schedule permits.   Quatavious Rossa,KATHrine E 10/26/2017, 10:41 AM Zenovia JarredKati Denecia Brunette, PT, DPT 10/26/2017 Pager: [phone]

## 2017-10-26 NOTE — Discharge Summary (Signed)
Physician Discharge Summary  Ashley Bradley TTS:[id number] DOB: Jan 16, 1951 DOA: 10/13/2017  PCP: Kathyrn Lass, MD  Admit date: 10/13/2017 Discharge date: 10/26/2017  Admitted From: Home Disposition:  Home  Recommendations for Outpatient Follow-up:  1. Follow up with PCP in 1 week 2. Follow-up with ID/Dr. Linus Salmons in 1 to 2 weeks with repeat blood work  Home Health: Yes Equipment/Devices: PICC line  Discharge Condition: Stable CODE STATUS: Full Diet recommendation: Heart Healthy   Brief/Interim Summary: 67 year old female with history of alcohol abuse, depression, hypertension, alcohol related seizures and prolonged QT presented with altered mental status and was admitted on 10/13/2017.  There was a question of GI bleeding as well.  Patient was hypotensive, tachypneic and tachycardic.  She was initially admitted under the critical care team.  She required intubation.  She was also found to have acute kidney injury and hypovolemic shock.  She required CVVHD and pressors.  She was extubated on 10/15/2017.  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  She spiked fever on 10/19/2017 and was found to have MSSA bacteremia and  pneumonia and was started on antibiotics.  ID was consulted.  TEE on 10/24/2017 was negative.  Repeat cultures have been negative.  Patient had a PICC line placed today and she will be discharged home on IV antibiotics to complete course as per ID recommendations.    Discharge Diagnoses:  Active Problems:   AKI (acute kidney injury) ([place])   High anion gap metabolic acidosis   Acute metabolic encephalopathy   Bacteremia  MSSA bacteremia - Repeat cultures from 10/21/2017 have been negative so far. -Patient was initially started on Unasyn on 10/19/2017 which was changed to Ancef plus Flagyl on 10/20/2017.  Due to skin peeling on hands and feet, Ancef was changed to vancomycin as per ID  recommendations. -TEE on 10/21/2017 was negative for vegetations -ID recommends vancomycin for 2 weeks till 11/03/2017  -PICC line was placed today.  She will be discharged on IV vancomycin to complete course till 11/03/2017 and outpatient follow-up with ID with repeat blood work.  Right lower lobe pneumonia with concern for aspiration -Aspiration precautions.  Diet as per SLP recommendations -Antibiotic plan as above -Respiratory status stable.  Acute hypoxic respiratory failure -Status post intubation and extubation.  Currently on room air.  Respiratory status stable  Hypovolemic shock -Resolved.  Hemodynamically stable.  Initially required pressors with CRRT  Acute kidney injury -Resolved.  Off CRRT.  Renal function stable  Probable upper GI bleeding -Resolved.  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  Hemoglobin stable.    Continue Protonix.  Normocytic anemia -Hemoglobin stable. No signs of bleeding currently.  Outpatient follow-up with -Status post PRBC transfusion x1 on 10/20/2017 -Continue Protonix.  Acute metabolic/toxic encephalopathy -Present on admission.  Required intubation -Status post extubation -Improved. -Mental status stable  Hypomagnesemia -replaced.   Elevated LFTs -Probably secondary to alcohol abuse.  Improving.  Outpatient follow-up.  Hepatitis panel negative  Alcohol abuse -Outpatient follow-up.  Continue multivitamin, thiamine and folate  History of hypertension -Continue losartan.  Outpatient follow-up    Discharge Instructions  Discharge Instructions    Call MD for:  difficulty breathing, headache or visual disturbances   Complete by:  As directed    Call MD for:  extreme fatigue   Complete by:  As directed    Call MD for:  hives   Complete by:  As directed  Call MD for:  persistant dizziness or light-headedness   Complete by:  As directed    Call MD for:  persistant nausea and vomiting   Complete  by:  As directed    Call MD for:  severe uncontrolled pain   Complete by:  As directed    Call MD for:  temperature >100.4   Complete by:  As directed    Diet - low sodium heart healthy   Complete by:  As directed    Home infusion instructions [place] May follow [place] Dosing Protocol; May administer Cathflo as needed to maintain patency of vascular access device.; Flushing of vascular access device: per [facility] Protocol: 0.9% NaCl pre/post medica...   Complete by:  As directed    Instructions:  May follow [place] Dosing Protocol   Instructions:  May administer Cathflo as needed to maintain patency of vascular access device.   Instructions:  Flushing of vascular access device: per [facility] Protocol: 0.9% NaCl pre/post medication administration and prn patency; Heparin 100 u/ml, 44m for implanted ports and Heparin 10u/ml, 536mfor all other central venous catheters.   Instructions:  May follow [facility] Anaphylaxis Protocol for First Dose Administration in the home: 0.9% NaCl at 25-50 ml/hr to maintain IV access for protocol meds. Epinephrine 0.3 ml IV/IM PRN and Benadryl 25-50 IV/IM PRN s/s of anaphylaxis.   Instructions:  Ad[facility]isnfusion Coordinator (RN) to assist per patient IV care needs in the home PRN.   Increase activity slowly   Complete by:  As directed      Allergies as of 10/26/2017      Reactions   Macrodantin [nitrofurantoin] Nausea And Vomiting      Medication List    STOP taking these medications   ALPRAZolam 0.5 MG tablet Commonly known as:  XANAX   cephALEXin 500 MG capsule Commonly known as:  KEFLEX   phosphorus 155-852-130 MG tablet Commonly known as:  K PHOS NEUTRAL     TAKE these medications   aspirin EC 81 MG tablet Take 81 mg by mouth daily.   FLUoxetine 20 MG capsule Commonly known as:  PROZAC Take 1 capsule (20 mg total) by mouth daily. What changed:  how much to take   folic acid 1 MG tablet Commonly known as:  FOLVITE Take 1  tablet (1 mg total) by mouth daily.   loperamide 2 MG capsule Commonly known as:  IMODIUM Take 1 capsule (2 mg total) by mouth every 8 (eight) hours as needed for diarrhea or loose stools.   losartan 50 MG tablet Commonly known as:  COZAAR Take 1 tablet (50 mg total) by mouth daily.   multivitamin with minerals Tabs tablet Take 1 tablet by mouth daily.   pantoprazole 40 MG tablet Commonly known as:  PROTONIX Take 1 tablet (40 mg total) by mouth daily. Start taking on:  10/27/2017   saccharomyces boulardii 250 MG capsule Commonly known as:  FLORASTOR Take 250 mg by mouth 2 (two) times daily.   thiamine 100 MG tablet Take 1 tablet (100 mg total) by mouth daily.   vancomycin IVPB Inject 1,250 mg into the vein daily. Indication:  bacteremia Last Day of Therapy:  11/03/17 Labs - Sunday/Monday:  CBC/D, BMP, and vancomycin trough. Labs - Thursday:  BMP and vancomycin trough Labs - Every other week:  ESR and CRP            Home Infusion Instuctions  (From admission, onward)  Start     Ordered   10/26/17 0000  Home infusion instructions [facility] May follow [place] Dosing Protocol; May administer Cathflo as needed to maintain patency of vascular access device.; Flushing of vascular access device: per [facility] Protocol: 0.9% NaCl pre/post medica...    Question Answer Comment  Instructions May follow [place] Dosing Protocol   Instructions May administer Cathflo as needed to maintain patency of vascular access device.   Instructions Flushing of vascular access device: per [facility] Protocol: 0.9% NaCl pre/post medication administration and prn patency; Heparin 100 u/ml, 41m for implanted ports and Heparin 10u/ml, 547mfor all other central venous catheters.   Instructions May follow [facility] Anaphylaxis Protocol for First Dose Administration in the home: 0.9% NaCl at 25-50 ml/hr to maintain IV access for protocol meds. Epinephrine 0.3 ml IV/IM PRN and Benadryl 25-50 IV/IM PRN  s/s of anaphylaxis.   Instructions [facility] Infusion Coordinator (RN) to assist per patient IV care needs in the home PRN.      10/26/17 1311     Follow-up Information    MiKathyrn LassMD. Schedule an appointment as soon as possible for a visit in 1 week(s).   Specialty:  Family Medicine Contact information: [phone]        CoThayer HeadingsMD. Schedule an appointment as soon as possible for a visit in 1 week(s).   Specialty:  Infectious Diseases Contact information: [address] [place] [phone]        Allergies  Allergen Reactions  . Macrodantin [Nitrofurantoin] Nausea And Vomiting    Consultations: Critical care/GI/nephrology/ID/cardiology  Procedures/Studies: Dg Abd 1 View  Result Date: 10/13/2017 CLINICAL DATA:  Orogastric tube placement EXAM: ABDOMEN - 1 VIEW COMPARISON:  CT abdomen and pelvis January 16, 2016 FINDINGS: Orogastric tube tip and side port are in the stomach. There is no appreciable bowel dilatation or air-fluid level to suggest bowel obstruction. No free air. Probable old trauma in the sacroiliac joint region on the right, stable. IMPRESSION: Orogastric tube tip and side port in stomach. No evident bowel obstruction or free air. Electronically Signed   By: WiLowella GripII M.D.   On: 10/13/2017 17:16   Ct Head Wo Contrast  Result Date: 10/13/2017 CLINICAL DATA:  Altered level of consciousness. EXAM: CT HEAD WITHOUT CONTRAST TECHNIQUE: Contiguous axial images were obtained from the base of the skull through the vertex without intravenous contrast. COMPARISON:  Head CT 01/26/2016.  Brain MRI 01/28/2016 FINDINGS: Brain: Similar mild atrophy and chronic small vessel ischemia. No intracranial hemorrhage, mass effect, or midline shift. No hydrocephalus. The basilar cisterns are patent. Physiologic basal gangliar calcifications unchanged. No evidence of territorial infarct  or acute ischemia. No extra-axial or intracranial fluid collection. Vascular: No hyperdense vessel or unexpected calcification. Skull: No fracture or focal lesion. Sinuses/Orbits: Paranasal sinuses and mastoid air cells are clear. The visualized orbits are unremarkable. Other: None. IMPRESSION: 1.  No acute intracranial abnormality. 2. Stable degree of atrophy and chronic small vessel ischemia. Electronically Signed   By: MeJeb Levering.D.   On: 10/13/2017 03:22   Ct Chest Wo Contrast  Result Date: 10/18/2017 CLINICAL DATA:  Cough, acute kidney failure. History of bladder surgery and urinary tract infections. EXAM: CT CHEST, ABDOMEN AND PELVIS WITHOUT CONTRAST TECHNIQUE: Multidetector CT imaging of the chest, abdomen and pelvis was performed following the standard protocol without IV contrast. COMPARISON:  Chest radiograph October 16, 2017, abdominal radiograph October 13, 2017 and CT abdomen and pelvis January 16, 2016 FINDINGS: CT CHEST FINDINGS CARDIOVASCULAR: Heart and pericardium are unremarkable. Thoracic aorta is normal course and caliber, unremarkable. MEDIASTINUM/NODES: No mediastinal mass. No lymphadenopathy by CT size criteria limited assessment without contrast. Normal appearance of thoracic esophagus though not tailored for evaluation. RIGHT internal jugular central venous catheter terminates in mid superior vena cava. LUNGS/PLEURA: Tracheobronchial tree is patent, no pneumothorax. Prominent vascular structures corresponding to hilar prominence on prior radiograph. Small RIGHT and trace LEFT pleural effusion. Scattered ground-glass opacities including centrilobular ground-glass nodules and patchy consolidation most conspicuous in RIGHT lower lobe with air bronchograms. MUSCULOSKELETAL: Nonacute. Calcified bilateral breast implants. Elevated RIGHT hemidiaphragm. ACDF. Scattered Schmorl's nodes. CT ABDOMEN PELVIS FINDINGS HEPATOBILIARY: Diffusely markedly hypodense liver. Similarly dense gallbladder  without CT findings of acute cholecystitis. PANCREAS: Normal.  Very slight peripancreatic fat stranding. SPLEEN: Normal. ADRENALS/URINARY TRACT: Kidneys are orthotopic, demonstrating normal size and morphology. Mild nonspecific RIGHT perinephric fat stranding. No nephrolithiasis, hydronephrosis; limited assessment for renal masses on this nonenhanced examination. The unopacified ureters are normal in course and caliber. Urinary bladder is well distended with nondependent gas and mild perivesicular fat stranding. Normal adrenal glands. STOMACH/BOWEL: The stomach, small and large bowel are normal in course and caliber without inflammatory changes, sensitivity decreased by lack of enteric contrast. Normal appendix. VASCULAR/LYMPHATIC: Aortoiliac vessels are normal in course and caliber. Trace calcific atherosclerosis. No lymphadenopathy by CT size criteria. REPRODUCTIVE: Normal. OTHER: No intraperitoneal free fluid or free air. MUSCULOSKELETAL: Non-acute. Chronic deformity RIGHT iliac wing. Small bilateral hip effusions. Minimal grade 1 L3-4 anterolisthesis, multilevel moderate to severe facet arthropathy. Moderate to severe L5-S1 degenerative disc. IMPRESSION: CT CHEST: 1. Scattered ground-glass opacities and dense consolidations concerning for pneumonia, less likely pulmonary edema. Small RIGHT and trace LEFT pleural effusions. CT ABDOMEN AND PELVIS: 1. Mild urinary bladder wall thickening with gas concerning for cystitis, recommend correlation with recent instrumentation. 2. Marked hepatic steatosis. 3. Slight peripancreatic fat stranding, potential pancreatitis. Recommend correlation with amylase and lipase. Aortic Atherosclerosis (ICD10-I70.0). Electronically Signed   By: Elon Alas M.D.   On: 10/18/2017 17:53   US Renal  Result Date: 10/13/2017 CLINICAL DATA:  Renal failure. EXAM: RENAL / URINARY TRACT ULTRASOUND COMPLETE COMPARISON:  Renal ultrasound 01/07/2016 scratch the renal ultrasound 01/17/2016.  FINDINGS: Right Kidney: Length: 10.6 cm. No hydronephrosis. Cortical echogenicity is mildly increased. Note is made that the liver is echogenic. Left Kidney: Length: 11.0 cm. No hydronephrosis. Cortical echogenicity is mildly increased. Bladder: Not visualized. IMPRESSION: Negative for hydronephrosis or acute abnormality. Increased cortical echogenicity compatible with medical renal disease. Fatty infiltration of the liver. Electronically Signed   By: Inge Rise M.D.   On: 10/13/2017 12:52   Dg Chest Port 1 View  Result Date: 10/16/2017 CLINICAL DATA:  Acute kidney injury. EXAM: PORTABLE CHEST 1 VIEW COMPARISON:  Radiograph of October 13, 2017. FINDINGS: Stable cardiomediastinal silhouette. Endotracheal and nasogastric tubes have been removed. No pneumothorax is noted. Right internal jugular dialysis catheter is noted. Stable left lower lobe opacity is noted concerning for atelectasis or infiltrate. Mild right midlung opacity is noted concerning for atelectasis or infiltrate, with slightly increased right infrahilar opacity. No significant pleural effusion is noted. Bony thorax is unremarkable. IMPRESSION: Endotracheal and nasogastric tubes have been removed. Bilateral lung opacities are noted concerning for atelectasis or infiltrate. Electronically Signed   By: Marijo Conception, M.D.   On: 10/16/2017 07:05   Dg Chest Port 1 View  Result Date: 10/13/2017  CLINICAL DATA:  ETT, central line, and OG tube placement EXAM: PORTABLE CHEST 1 VIEW COMPARISON:  10/13/2017 at 0047 hours FINDINGS: Endotracheal tube terminates 3 cm above the carina. Right IJ venous catheter terminates in the lower SVC. Enteric tube courses into the stomach. Increased perihilar markings with mild left lower lobe opacity. This appearance suggests mild interstitial edema with left lower lobe atelectasis versus pneumonia. Aspiration is also possible. No definite pleural effusions. The heart is top-normal in size. Cervical spine fixation  hardware, incompletely visualized. IMPRESSION: Endotracheal tube terminates 3 cm above the carina. Right IJ venous catheter terminates in the lower SVC. Enteric tube courses into the stomach. Left lower lobe opacity, atelectasis versus pneumonia. Aspiration is also possible. Electronically Signed   By: Julian Hy M.D.   On: 10/13/2017 17:16   Dg Chest Port 1 View  Result Date: 10/13/2017 CLINICAL DATA:  Cough and shortness of breath. EXAM: PORTABLE CHEST 1 VIEW COMPARISON:  06/03/2016 FINDINGS: Low lung volumes.The cardiomediastinal contours are normal for portable AP technique pulmonary vasculature is normal. No consolidation, pleural effusion, or pneumothorax. No acute osseous abnormalities are seen. Cervical spine hardware is partially included. IMPRESSION: Low lung volumes without acute abnormality. Electronically Signed   By: Jeb Levering M.D.   On: 10/13/2017 01:51   Dg Swallowing Func-speech Pathology  Result Date: 10/19/2017 Objective Swallowing Evaluation: Type of Study: MBS-Modified Barium Swallow Study  Patient Details Name: JOZEE HAMMER MRN: [id number] Date of Birth: Nov 19, 1950 Today's Date: 10/19/2017 Time: SLP Start Time (ACUTE ONLY): 0850 -SLP Stop Time (ACUTE ONLY): 0930 SLP Time Calculation (min) (ACUTE ONLY): 40 min Past Medical History: Past Medical History: Diagnosis Date . Alcoholism ([place])  . Complication of anesthesia   hard time waking up . CP (cerebral palsy) ([place])  . Depression  . Hypertension  . PONV (postoperative nausea and vomiting)  . Seizures ([place])   ETOH induced Past Surgical History: Past Surgical History: Procedure Laterality Date . BLADDER SURGERY   . BREAST SURGERY   . TUBAL LIGATION   HPI: 67 yo female adm to [facility] with AMS - required intubation for hypovolemic shock secondary to AKI.  Pt with coffee ground emesis, hoarse, sore throat and h/o dysphagia s/p ACDF 10 years ago with dysphagia following.   MBS indicated due to premorbid dysphagia, intubation-dysphonia  and concern for pna.   Subjective: pt awake in chair Assessment / Plan / Recommendation CHL IP CLINICAL IMPRESSIONS 10/19/2017 Clinical Impression Patient presents with minimal oropharyngeal dysphagia - No aspiration observed.  Minimal oral residuals noted but pt orally transits and clears.  Trace laryngeal penetration of thin and nectar *flash* noted due to decreased timing of laryngeal closure.  Pt taxed with sequential straw swallows of thin and still did not aspirate.  Barium tablet given with thin momentarily lodged at vallecular region but cleared with furhter liquid boluses.  For maximal safety, recommend pt continue pills with applesauce - follow with liquids.  Pt agreeable to plan.  Pt's dysphagia symptoms were not redemonstrated during MBS but she did cough intermittently without barium visualized in larynx.  Recommend regular/thin diet with aspiration precautions.  Will follow up for dysphagia management, thanks for this order.  SLP Visit Diagnosis Dysphagia, oropharyngeal phase (R13.12) Attention and concentration deficit following -- Frontal lobe and executive function deficit following -- Impact on safety and function Mild aspiration risk   CHL IP TREATMENT RECOMMENDATION 10/19/2017 Treatment Recommendations Therapy as outlined in treatment plan below   Prognosis 10/19/2017 Prognosis for Safe Diet Advancement Good  Barriers to Reach Goals -- Barriers/Prognosis Comment -- CHL IP DIET RECOMMENDATION 10/19/2017 SLP Diet Recommendations Regular solids;Thin liquid Liquid Administration via Cup;Straw Medication Administration Whole meds with puree Compensations Minimize environmental distractions;Slow rate;Small sips/bites;Other (Comment) Postural Changes Remain semi-upright after after feeds/meals (Comment);Seated upright at 90 degrees   CHL IP OTHER RECOMMENDATIONS 10/19/2017 Recommended Consults -- Oral Care Recommendations Oral care BID Other Recommendations --   CHL IP FOLLOW UP RECOMMENDATIONS 10/19/2017  Follow up Recommendations (No Data)   CHL IP FREQUENCY AND DURATION 10/19/2017 Speech Therapy Frequency (ACUTE ONLY) min 1 x/week Treatment Duration 1 week      CHL IP ORAL PHASE 10/19/2017 Oral Phase WFL Oral - Pudding Teaspoon -- Oral - Pudding Cup -- Oral - Honey Teaspoon -- Oral - Honey Cup -- Oral - Nectar Teaspoon -- Oral - Nectar Cup WFL Oral - Nectar Straw WFL Oral - Thin Teaspoon WFL Oral - Thin Cup WFL Oral - Thin Straw WFL Oral - Puree WFL Oral - Mech Soft -- Oral - Regular Lingual/palatal residue;Premature spillage;Other (Comment) Oral - Multi-Consistency -- Oral - Pill WFL Oral Phase - Comment --  CHL IP PHARYNGEAL PHASE 10/19/2017 Pharyngeal Phase Impaired Pharyngeal- Pudding Teaspoon -- Pharyngeal -- Pharyngeal- Pudding Cup -- Pharyngeal -- Pharyngeal- Honey Teaspoon -- Pharyngeal -- Pharyngeal- Honey Cup -- Pharyngeal -- Pharyngeal- Nectar Teaspoon -- Pharyngeal -- Pharyngeal- Nectar Cup -- Pharyngeal -- Pharyngeal- Nectar Straw Penetration/Aspiration during swallow Pharyngeal Material enters airway, remains ABOVE vocal cords then ejected out Pharyngeal- Thin Teaspoon WFL Pharyngeal -- Pharyngeal- Thin Cup -- Pharyngeal -- Pharyngeal- Thin Straw Reduced airway/laryngeal closure Pharyngeal Material enters airway, remains ABOVE vocal cords then ejected out Pharyngeal- Puree WFL Pharyngeal -- Pharyngeal- Mechanical Soft -- Pharyngeal -- Pharyngeal- Regular WFL Pharyngeal -- Pharyngeal- Multi-consistency -- Pharyngeal -- Pharyngeal- Pill Pharyngeal residue - valleculae Pharyngeal -- Pharyngeal Comment --  CHL IP CERVICAL ESOPHAGEAL PHASE 10/19/2017 Cervical Esophageal Phase WFL Pudding Teaspoon -- Pudding Cup -- Honey Teaspoon -- Honey Cup -- Nectar Teaspoon -- Nectar Cup -- Nectar Straw -- Thin Teaspoon -- Thin Cup -- Thin Straw -- Puree -- Mechanical Soft -- Regular -- Multi-consistency -- Pill -- Cervical Esophageal Comment minimal residuals appear at distal esophagus - pt did not report symptoms of  stasis  No flowsheet data found. Macario Golds 10/19/2017, 9:49 AM  Luanna Salk, [place] SLP [phone]             Ct Renal Stone Study  Result Date: 10/18/2017 CLINICAL DATA:  Cough, acute kidney failure. History of bladder surgery and urinary tract infections. EXAM: CT CHEST, ABDOMEN AND PELVIS WITHOUT CONTRAST TECHNIQUE: Multidetector CT imaging of the chest, abdomen and pelvis was performed following the standard protocol without IV contrast. COMPARISON:  Chest radiograph October 16, 2017, abdominal radiograph October 13, 2017 and CT abdomen and pelvis January 16, 2016 FINDINGS: CT CHEST FINDINGS CARDIOVASCULAR: Heart and pericardium are unremarkable. Thoracic aorta is normal course and caliber, unremarkable. MEDIASTINUM/NODES: No mediastinal mass. No lymphadenopathy by CT size criteria limited assessment without contrast. Normal appearance of thoracic esophagus though not tailored for evaluation. RIGHT internal jugular central venous catheter terminates in mid superior vena cava. LUNGS/PLEURA: Tracheobronchial tree is patent, no pneumothorax. Prominent vascular structures corresponding to hilar prominence on prior radiograph. Small RIGHT and trace LEFT pleural effusion. Scattered ground-glass opacities including centrilobular ground-glass nodules and patchy consolidation most conspicuous in RIGHT lower lobe with air bronchograms. MUSCULOSKELETAL: Nonacute. Calcified bilateral breast implants. Elevated RIGHT hemidiaphragm. ACDF. Scattered Schmorl's nodes. CT ABDOMEN PELVIS FINDINGS  HEPATOBILIARY: Diffusely markedly hypodense liver. Similarly dense gallbladder without CT findings of acute cholecystitis. PANCREAS: Normal.  Very slight peripancreatic fat stranding. SPLEEN: Normal. ADRENALS/URINARY TRACT: Kidneys are orthotopic, demonstrating normal size and morphology. Mild nonspecific RIGHT perinephric fat stranding. No nephrolithiasis, hydronephrosis; limited assessment for renal masses on this nonenhanced  examination. The unopacified ureters are normal in course and caliber. Urinary bladder is well distended with nondependent gas and mild perivesicular fat stranding. Normal adrenal glands. STOMACH/BOWEL: The stomach, small and large bowel are normal in course and caliber without inflammatory changes, sensitivity decreased by lack of enteric contrast. Normal appendix. VASCULAR/LYMPHATIC: Aortoiliac vessels are normal in course and caliber. Trace calcific atherosclerosis. No lymphadenopathy by CT size criteria. REPRODUCTIVE: Normal. OTHER: No intraperitoneal free fluid or free air. MUSCULOSKELETAL: Non-acute. Chronic deformity RIGHT iliac wing. Small bilateral hip effusions. Minimal grade 1 L3-4 anterolisthesis, multilevel moderate to severe facet arthropathy. Moderate to severe L5-S1 degenerative disc. IMPRESSION: CT CHEST: 1. Scattered ground-glass opacities and dense consolidations concerning for pneumonia, less likely pulmonary edema. Small RIGHT and trace LEFT pleural effusions. CT ABDOMEN AND PELVIS: 1. Mild urinary bladder wall thickening with gas concerning for cystitis, recommend correlation with recent instrumentation. 2. Marked hepatic steatosis. 3. Slight peripancreatic fat stranding, potential pancreatitis. Recommend correlation with amylase and lipase. Aortic Atherosclerosis (ICD10-I70.0). Electronically Signed   By: Elon Alas M.D.   On: 10/18/2017 17:53   [place] Ekg Site Rite  Result Date: 10/25/2017 If Site Rite image not attached, placement could not be confirmed due to current cardiac rhythm.    CRRT  Intubation/extubation  IJ placement and removal  EGD on 4/20  PICC line placement on 10/26/2017   Subjective: Patient seen and examined at bedside.  She denies any overnight fever, nausea or vomiting.  Complains of some diarrhea this morning.  She feels better and wants to go home  Discharge Exam: Vitals:   10/25/17 2052 10/26/17 0452  BP: (!) 143/79 129/77  Pulse: 89 (!) 111   Resp: 20 17  Temp: 98.7 F (37.1 C) 98.4 F (36.9 C)  SpO2: 97% 98%   Vitals:   10/25/17 0614 10/25/17 1512 10/25/17 2052 10/26/17 0452  BP: (!) 154/79 (!) 149/85 (!) 143/79 129/77  Pulse: 84 90 89 (!) 111  Resp: 20 18 20 17   Temp: 98.9 F (37.2 C) 98.9 F (37.2 C) 98.7 F (37.1 C) 98.4 F (36.9 C)  TempSrc: Oral Oral    SpO2: 98% 98% 97% 98%  Weight:      Height:        General: Pt is alert, awake, not in acute distress Cardiovascular: Rate controlled, S1/S2 + Respiratory: Bilateral decreased breath sounds at bases  abdominal: Soft, NT, ND, bowel sounds + Extremities: Trace edema, no cyanosis    The results of significant diagnostics from this hospitalization (including imaging, microbiology, ancillary and laboratory) are listed below for reference.     Microbiology: Recent Results (from the past 240 hour(s))  Culture, Urine     Status: Abnormal   Collection Time: 10/18/17  6:51 PM  Result Value Ref Range Status   Specimen Description   Final    URINE, RANDOM Performed at [place] [address], [place], [place] [address]    Special Requests   Final    NONE Performed at [facility], [place] [address], [place], [place] [address]    Culture (A)  Final    >=100,000 COLONIES/mL ENTEROBACTER CLOACAE 40,000 COLONIES/mL ESCHERICHIA COLI 40,000 COLONIES/mL ENTEROCOCCUS FAECALIS  Report Status 10/21/2017 FINAL  Final   Organism ID, Bacteria ENTEROBACTER CLOACAE (A)  Final   Organism ID, Bacteria ESCHERICHIA COLI (A)  Final   Organism ID, Bacteria ENTEROCOCCUS FAECALIS (A)  Final      Susceptibility   Enterobacter cloacae - MIC*    CEFAZOLIN >=64 RESISTANT Resistant     CEFTRIAXONE <=1 SENSITIVE Sensitive     CIPROFLOXACIN <=0.25 SENSITIVE Sensitive     GENTAMICIN <=1 SENSITIVE Sensitive     IMIPENEM 0.5 SENSITIVE Sensitive     NITROFURANTOIN 64 INTERMEDIATE Intermediate     TRIMETH/SULFA <=20 SENSITIVE Sensitive      PIP/TAZO <=4 SENSITIVE Sensitive     * >=100,000 COLONIES/mL ENTEROBACTER CLOACAE   Escherichia coli - MIC*    AMPICILLIN <=2 SENSITIVE Sensitive     CEFAZOLIN <=4 SENSITIVE Sensitive     CEFTRIAXONE <=1 SENSITIVE Sensitive     CIPROFLOXACIN <=0.25 SENSITIVE Sensitive     GENTAMICIN <=1 SENSITIVE Sensitive     IMIPENEM <=0.25 SENSITIVE Sensitive     NITROFURANTOIN <=16 SENSITIVE Sensitive     TRIMETH/SULFA <=20 SENSITIVE Sensitive     AMPICILLIN/SULBACTAM <=2 SENSITIVE Sensitive     PIP/TAZO <=4 SENSITIVE Sensitive     Extended ESBL NEGATIVE Sensitive     * 40,000 COLONIES/mL ESCHERICHIA COLI   Enterococcus faecalis - MIC*    AMPICILLIN <=2 SENSITIVE Sensitive     LEVOFLOXACIN 2 SENSITIVE Sensitive     NITROFURANTOIN <=16 SENSITIVE Sensitive     VANCOMYCIN 1 SENSITIVE Sensitive     * 40,000 COLONIES/mL ENTEROCOCCUS FAECALIS  Culture, blood (routine x 2)     Status: Abnormal   Collection Time: 10/19/17 10:02 AM  Result Value Ref Range Status   Specimen Description   Final    BLOOD RIGHT ANTECUBITAL Performed at [facility], [place] [address], [place], [place] [address]    Special Requests   Final    BOTTLES DRAWN AEROBIC AND ANAEROBIC Blood Culture adequate volume Performed at [place] [address], [place], [place] [address]    Culture  Setup Time   Final    GRAM POSITIVE COCCI IN BOTH AEROBIC AND ANAEROBIC BOTTLES CRITICAL RESULT CALLED TO, READ BACK BY AND VERIFIED WITH: B GREEN PHARMD 10/20/17 0405 JDW Performed at [facility], [place] [address] [place], [place] [address]    Culture STAPHYLOCOCCUS AUREUS (A)  Final   Report Status 10/22/2017 FINAL  Final   Organism ID, Bacteria STAPHYLOCOCCUS AUREUS  Final      Susceptibility   Staphylococcus aureus - MIC*    CIPROFLOXACIN <=0.5 SENSITIVE Sensitive     ERYTHROMYCIN <=0.25 SENSITIVE Sensitive     GENTAMICIN <=0.5 SENSITIVE Sensitive     OXACILLIN <=0.25 SENSITIVE Sensitive      TETRACYCLINE <=1 SENSITIVE Sensitive     VANCOMYCIN 1 SENSITIVE Sensitive     TRIMETH/SULFA <=10 SENSITIVE Sensitive     CLINDAMYCIN <=0.25 SENSITIVE Sensitive     RIFAMPIN <=0.5 SENSITIVE Sensitive     Inducible Clindamycin NEGATIVE Sensitive     * STAPHYLOCOCCUS AUREUS  Blood Culture ID Panel (Reflexed)     Status: Abnormal   Collection Time: 10/19/17 10:02 AM  Result Value Ref Range Status   Enterococcus species NOT DETECTED NOT DETECTED Final   Listeria monocytogenes NOT DETECTED NOT DETECTED Final   Staphylococcus species DETECTED (A) NOT DETECTED Final    Comment: CRITICAL RESULT CALLED TO, READ BACK BY AND VERIFIED WITH: B GREEN PHARMD 10/20/17 0405  JDW    Staphylococcus aureus DETECTED (A) NOT DETECTED Final    Comment: Methicillin (oxacillin) susceptible Staphylococcus aureus (MSSA). Preferred therapy is anti staphylococcal beta lactam antibiotic (Cefazolin or Nafcillin), unless clinically contraindicated. CRITICAL RESULT CALLED TO, READ BACK BY AND VERIFIED WITH: B GREEN PHARMD 10/20/17 0405 JDW    Methicillin resistance NOT DETECTED NOT DETECTED Final   Streptococcus species NOT DETECTED NOT DETECTED Final   Streptococcus agalactiae NOT DETECTED NOT DETECTED Final   Streptococcus pneumoniae NOT DETECTED NOT DETECTED Final   Streptococcus pyogenes NOT DETECTED NOT DETECTED Final   Acinetobacter baumannii NOT DETECTED NOT DETECTED Final   Enterobacteriaceae species NOT DETECTED NOT DETECTED Final   Enterobacter cloacae complex NOT DETECTED NOT DETECTED Final   Escherichia coli NOT DETECTED NOT DETECTED Final   Klebsiella oxytoca NOT DETECTED NOT DETECTED Final   Klebsiella pneumoniae NOT DETECTED NOT DETECTED Final   Proteus species NOT DETECTED NOT DETECTED Final   Serratia marcescens NOT DETECTED NOT DETECTED Final   Haemophilus influenzae NOT DETECTED NOT DETECTED Final   Neisseria meningitidis NOT DETECTED NOT DETECTED Final   Pseudomonas aeruginosa NOT DETECTED  NOT DETECTED Final   Candida albicans NOT DETECTED NOT DETECTED Final   Candida glabrata NOT DETECTED NOT DETECTED Final   Candida krusei NOT DETECTED NOT DETECTED Final   Candida parapsilosis NOT DETECTED NOT DETECTED Final   Candida tropicalis NOT DETECTED NOT DETECTED Final  Culture, blood (routine x 2)     Status: Abnormal   Collection Time: 10/19/17 10:07 AM  Result Value Ref Range Status   Specimen Description   Final    BLOOD LEFT ANTECUBITAL Performed at [facility], [place] [address], [place], [place] [address]    Special Requests   Final    BOTTLES DRAWN AEROBIC AND ANAEROBIC Blood Culture adequate volume Performed at [place] [address], [place], [place] [address]    Culture  Setup Time   Final    GRAM POSITIVE COCCI IN BOTH AEROBIC AND ANAEROBIC BOTTLES CRITICAL RESULT CALLED TO, READ BACK BY AND VERIFIED WITH: B GREEN PHARMD 10/20/17 0405 JDW    Culture (A)  Final    STAPHYLOCOCCUS AUREUS SUSCEPTIBILITIES PERFORMED ON PREVIOUS CULTURE WITHIN THE LAST 5 DAYS. Performed at [facility], [place] [address], [place], [place] [address]    Report Status 10/22/2017 FINAL  Final  Culture, blood (routine x 2)     Status: None (Preliminary result)   Collection Time: 10/21/17  6:09 AM  Result Value Ref Range Status   Specimen Description   Final    BLOOD RIGHT ANTECUBITAL Performed at [place] [address], [place], [place] [address]    Special Requests   Final    BOTTLES DRAWN AEROBIC ONLY Blood Culture adequate volume Performed at [place] [address], [place], [place] [address]    Culture   Final    NO GROWTH 4 DAYS Performed at [facility], [place] [address], [place], [place] [address]    Report Status PENDING  Incomplete  Culture, blood (routine x 2)     Status: None (Preliminary result)   Collection Time: 10/21/17  6:09 AM  Result Value Ref Range Status    Specimen Description   Final    BLOOD LEFT HAND Performed at [place] [address], [place], [place] [address]    Special Requests   Final    BOTTLES DRAWN AEROBIC ONLY  Blood Culture results may not be optimal due to an inadequate volume of blood received in culture bottles Performed at [place] [address], [place], [place] [address]    Culture   Final    NO GROWTH 4 DAYS Performed at [facility], [place] [address], [place], [place] [address]    Report Status PENDING  Incomplete  C difficile quick scan w PCR reflex     Status: None   Collection Time: 10/26/17  8:27 AM  Result Value Ref Range Status   C Diff antigen NEGATIVE NEGATIVE Final   C Diff toxin NEGATIVE NEGATIVE Final   C Diff interpretation No C. difficile detected.  Final    Comment: Performed at [facility], [place] [address], [place], [place] [address]     Labs: BNP (last 3 results) No results for input(s): BNP in the last 8760 hours. Basic Metabolic Panel: Recent Labs  Lab 10/20/17 1605  10/22/17 0553 10/23/17 0618 10/24/17 7262 10/25/17 0612 10/26/17 0555  NA 137   < > 139 139 143 140 142  K 2.8*   < > 2.8* 3.7 3.3* 3.5 3.1*  CL 94*   < > 102 110 112* 111 112*  CO2 31   < > 26 23 23  21* 20*  GLUCOSE 89   < > 114* 92 111* 89 121*  BUN 26*   < > 13 9 6  <5* <5*  CREATININE 1.48*   < > 0.99 0.69 0.65 0.56 0.64  CALCIUM 7.2*   < > 7.4* 7.3* 7.9* 7.9* 8.1*  MG 2.1  --  1.3* 1.8  --  1.4* 1.6*   < > = values in this interval not displayed.   Liver Function Tests: Recent Labs  Lab 10/22/17 0553 10/26/17 0555  AST 34 45*  ALT 10* 23  ALKPHOS 124 128*  BILITOT 0.7 0.5  PROT 5.3* 6.1*  ALBUMIN 2.1* 2.8*   Recent Labs  Lab 10/20/17 0353 10/21/17 0609 10/22/17 0553  LIPASE 69* 59* 45   No results for input(s): AMMONIA in the last 168 hours. CBC: Recent Labs  Lab 10/20/17 0353 10/20/17 1605 10/21/17 0609 10/23/17 1553  10/25/17 1618 10/26/17 0555  WBC 10.5 7.5 6.7 5.4 5.6 5.2  NEUTROABS 8.5*  --  4.7 3.5 2.7 2.9  HGB 6.8* 8.5* 8.7* 8.5* 8.5* 8.9*  HCT 20.0* 24.7* 25.8* 25.9* 25.7* 27.5*  MCV 88.9 87.3 88.7 89.0 91.5 92.9  PLT 318 363 373 564* 653* 642*   Cardiac Enzymes: No results for input(s): CKTOTAL, CKMB, CKMBINDEX, TROPONINI in the last 168 hours. BNP: Invalid input(s): POCBNP CBG: No results for input(s): GLUCAP in the last 168 hours. D-Dimer No results for input(s): DDIMER in the last 72 hours. Hgb A1c No results for input(s): HGBA1C in the last 72 hours. Lipid Profile No results for input(s): CHOL, HDL, LDLCALC, TRIG, CHOLHDL, LDLDIRECT in the last 72 hours. Thyroid function studies No results for input(s): TSH, T4TOTAL, T3FREE, THYROIDAB in the last 72 hours.  Invalid input(s): FREET3 Anemia work up No results for input(s): VITAMINB12, FOLATE, FERRITIN, TIBC, IRON, RETICCTPCT in the last 72 hours. Urinalysis    Component Value Date/Time   COLORURINE YELLOW 10/18/2017 1851   APPEARANCEUR CLEAR 10/18/2017 1851   LABSPEC 1.009 10/18/2017 1851   PHURINE 9.0 (H) 10/18/2017 1851   GLUCOSEU NEGATIVE 10/18/2017 1851   HGBUR SMALL (A) 10/18/2017 [place] NEGATIVE 10/18/2017 1851   [place] 10/18/2017 1851  PROTEINUR 30 (A) 10/18/2017 1851   UROBILINOGEN 1.0 09/23/2013 0732   NITRITE NEGATIVE 10/18/2017 1851   LEUKOCYTESUR TRACE (A) 10/18/2017 1851   Sepsis Labs Invalid input(s): PROCALCITONIN,  WBC,  LACTICIDVEN Microbiology Recent Results (from the past 240 hour(s))  Culture, Urine     Status: Abnormal   Collection Time: 10/18/17  6:51 PM  Result Value Ref Range Status   Specimen Description   Final    URINE, RANDOM Performed at [facility], [place] [address], [place], [place] [address]    Special Requests   Final    NONE Performed at [facility], [place] [address] [place], [place] [address]    Culture (A)  Final     >=100,000 COLONIES/mL ENTEROBACTER CLOACAE 40,000 COLONIES/mL ESCHERICHIA COLI 40,000 COLONIES/mL ENTEROCOCCUS FAECALIS    Report Status 10/21/2017 FINAL  Final   Organism ID, Bacteria ENTEROBACTER CLOACAE (A)  Final   Organism ID, Bacteria ESCHERICHIA COLI (A)  Final   Organism ID, Bacteria ENTEROCOCCUS FAECALIS (A)  Final      Susceptibility   Enterobacter cloacae - MIC*    CEFAZOLIN >=64 RESISTANT Resistant     CEFTRIAXONE <=1 SENSITIVE Sensitive     CIPROFLOXACIN <=0.25 SENSITIVE Sensitive     GENTAMICIN <=1 SENSITIVE Sensitive     IMIPENEM 0.5 SENSITIVE Sensitive     NITROFURANTOIN 64 INTERMEDIATE Intermediate     TRIMETH/SULFA <=20 SENSITIVE Sensitive     PIP/TAZO <=4 SENSITIVE Sensitive     * >=100,000 COLONIES/mL ENTEROBACTER CLOACAE   Escherichia coli - MIC*    AMPICILLIN <=2 SENSITIVE Sensitive     CEFAZOLIN <=4 SENSITIVE Sensitive     CEFTRIAXONE <=1 SENSITIVE Sensitive     CIPROFLOXACIN <=0.25 SENSITIVE Sensitive     GENTAMICIN <=1 SENSITIVE Sensitive     IMIPENEM <=0.25 SENSITIVE Sensitive     NITROFURANTOIN <=16 SENSITIVE Sensitive     TRIMETH/SULFA <=20 SENSITIVE Sensitive     AMPICILLIN/SULBACTAM <=2 SENSITIVE Sensitive     PIP/TAZO <=4 SENSITIVE Sensitive     Extended ESBL NEGATIVE Sensitive     * 40,000 COLONIES/mL ESCHERICHIA COLI   Enterococcus faecalis - MIC*    AMPICILLIN <=2 SENSITIVE Sensitive     LEVOFLOXACIN 2 SENSITIVE Sensitive     NITROFURANTOIN <=16 SENSITIVE Sensitive     VANCOMYCIN 1 SENSITIVE Sensitive     * 40,000 COLONIES/mL ENTEROCOCCUS FAECALIS  Culture, blood (routine x 2)     Status: Abnormal   Collection Time: 10/19/17 10:02 AM  Result Value Ref Range Status   Specimen Description   Final    BLOOD RIGHT ANTECUBITAL Performed at [facility], [place] [address], [place], [place] [address]    Special Requests   Final    BOTTLES DRAWN AEROBIC AND ANAEROBIC Blood Culture adequate volume Performed at [place] [address], [place], [place] [address]    Culture  Setup Time   Final    GRAM POSITIVE COCCI IN BOTH AEROBIC AND ANAEROBIC BOTTLES CRITICAL RESULT CALLED TO, READ BACK BY AND VERIFIED WITH: B GREEN PHARMD 10/20/17 0405 JDW Performed at [facility], [place] [address] [place], [place] [address]    Culture STAPHYLOCOCCUS AUREUS (A)  Final   Report Status 10/22/2017 FINAL  Final   Organism ID, Bacteria STAPHYLOCOCCUS AUREUS  Final      Susceptibility   Staphylococcus aureus - MIC*    CIPROFLOXACIN <=0.5 SENSITIVE Sensitive     ERYTHROMYCIN <=0.25 SENSITIVE Sensitive  GENTAMICIN <=0.5 SENSITIVE Sensitive     OXACILLIN <=0.25 SENSITIVE Sensitive     TETRACYCLINE <=1 SENSITIVE Sensitive     VANCOMYCIN 1 SENSITIVE Sensitive     TRIMETH/SULFA <=10 SENSITIVE Sensitive     CLINDAMYCIN <=0.25 SENSITIVE Sensitive     RIFAMPIN <=0.5 SENSITIVE Sensitive     Inducible Clindamycin NEGATIVE Sensitive     * STAPHYLOCOCCUS AUREUS  Blood Culture ID Panel (Reflexed)     Status: Abnormal   Collection Time: 10/19/17 10:02 AM  Result Value Ref Range Status   Enterococcus species NOT DETECTED NOT DETECTED Final   Listeria monocytogenes NOT DETECTED NOT DETECTED Final   Staphylococcus species DETECTED (A) NOT DETECTED Final    Comment: CRITICAL RESULT CALLED TO, READ BACK BY AND VERIFIED WITH: B GREEN PHARMD 10/20/17 0405 JDW    Staphylococcus aureus DETECTED (A) NOT DETECTED Final    Comment: Methicillin (oxacillin) susceptible Staphylococcus aureus (MSSA). Preferred therapy is anti staphylococcal beta lactam antibiotic (Cefazolin or Nafcillin), unless clinically contraindicated. CRITICAL RESULT CALLED TO, READ BACK BY AND VERIFIED WITH: B GREEN PHARMD 10/20/17 0405 JDW    Methicillin resistance NOT DETECTED NOT DETECTED Final   Streptococcus species NOT DETECTED NOT DETECTED Final   Streptococcus agalactiae NOT DETECTED NOT DETECTED Final   Streptococcus pneumoniae NOT  DETECTED NOT DETECTED Final   Streptococcus pyogenes NOT DETECTED NOT DETECTED Final   Acinetobacter baumannii NOT DETECTED NOT DETECTED Final   Enterobacteriaceae species NOT DETECTED NOT DETECTED Final   Enterobacter cloacae complex NOT DETECTED NOT DETECTED Final   Escherichia coli NOT DETECTED NOT DETECTED Final   Klebsiella oxytoca NOT DETECTED NOT DETECTED Final   Klebsiella pneumoniae NOT DETECTED NOT DETECTED Final   Proteus species NOT DETECTED NOT DETECTED Final   Serratia marcescens NOT DETECTED NOT DETECTED Final   Haemophilus influenzae NOT DETECTED NOT DETECTED Final   Neisseria meningitidis NOT DETECTED NOT DETECTED Final   Pseudomonas aeruginosa NOT DETECTED NOT DETECTED Final   Candida albicans NOT DETECTED NOT DETECTED Final   Candida glabrata NOT DETECTED NOT DETECTED Final   Candida krusei NOT DETECTED NOT DETECTED Final   Candida parapsilosis NOT DETECTED NOT DETECTED Final   Candida tropicalis NOT DETECTED NOT DETECTED Final  Culture, blood (routine x 2)     Status: Abnormal   Collection Time: 10/19/17 10:07 AM  Result Value Ref Range Status   Specimen Description   Final    BLOOD LEFT ANTECUBITAL Performed at [facility], [place] [address] [place], [place] [address]    Special Requests   Final    BOTTLES DRAWN AEROBIC AND ANAEROBIC Blood Culture adequate volume Performed at [place] [address] [place], [place] [address]    Culture  Setup Time   Final    GRAM POSITIVE COCCI IN BOTH AEROBIC AND ANAEROBIC BOTTLES CRITICAL RESULT CALLED TO, READ BACK BY AND VERIFIED WITH: B GREEN PHARMD 10/20/17 0405 JDW    Culture (A)  Final    STAPHYLOCOCCUS AUREUS SUSCEPTIBILITIES PERFORMED ON PREVIOUS CULTURE WITHIN THE LAST 5 DAYS. Performed at [facility], [place] [address], [place], [place] [address]    Report Status 10/22/2017 FINAL  Final  Culture, blood (routine x 2)     Status: None (Preliminary result)    Collection Time: 10/21/17  6:09 AM  Result Value Ref Range Status   Specimen Description   Final    BLOOD RIGHT ANTECUBITAL Performed at [place] [address] [place],  [place] [address]    Special Requests   Final    BOTTLES DRAWN AEROBIC ONLY Blood Culture adequate volume Performed at [place] [address] [place], [place] [address]    Culture   Final    NO GROWTH 4 DAYS Performed at [facility], [place] [address] [place], [place] [address]    Report Status PENDING  Incomplete  Culture, blood (routine x 2)     Status: None (Preliminary result)   Collection Time: 10/21/17  6:09 AM  Result Value Ref Range Status   Specimen Description   Final    BLOOD LEFT HAND Performed at [place] [address] [place], [place] [address]    Special Requests   Final    BOTTLES DRAWN AEROBIC ONLY Blood Culture results may not be optimal due to an inadequate volume of blood received in culture bottles Performed at [place] [address], [place], [place] [address]    Culture   Final    NO GROWTH 4 DAYS Performed at [facility], [place] [address] [place], [place] [address]    Report Status PENDING  Incomplete  C difficile quick scan w PCR reflex     Status: None   Collection Time: 10/26/17  8:27 AM  Result Value Ref Range Status   C Diff antigen NEGATIVE NEGATIVE Final   C Diff toxin NEGATIVE NEGATIVE Final   C Diff interpretation No C. difficile detected.  Final    Comment: Performed at [facility], [place] [address], [place],  [address]     Time coordinating discharge: 35 minutes  SIGNED:   Aline August, MD  [facility] 10/26/2017, 1:12 PM Pager: [phone]  If 7PM-7AM, please contact night-coverage [email or web address] Password [email or web address]

## 2017-10-26 NOTE — Progress Notes (Signed)
[facility] services for home IV ABX at DC. [facility] with [facility].Lorenza Chickory Barnett with [facility] is aware and will follow and support HH DC. [facility] and [facility] are prepared for DC to home today as ordered.  If patient discharges after hours, please call [phone].   Sedalia Mutaamela S Chandler 10/26/2017, 10:14 AM

## 2017-10-26 NOTE — Progress Notes (Signed)
Peripherally Inserted Central Catheter/Midline Placement  The IV Nurse has discussed with the patient and/or persons authorized to consent for the patient, the purpose of this procedure and the potential benefits and risks involved with this procedure.  The benefits include less needle sticks, lab draws from the catheter, and the patient may be discharged home with the catheter. Risks include, but not limited to, infection, bleeding, blood clot (thrombus formation), and puncture of an artery; nerve damage and irregular heartbeat and possibility to perform a PICC exchange if needed/ordered by physician.  Alternatives to this procedure were also discussed.  Bard Power PICC patient education guide, fact sheet on infection prevention and patient information card has been provided to patient /or left at bedside.    PICC/Midline Placement Documentation  PICC Single Lumen 10/26/17 PICC Right Basilic 39 cm 0 cm (Active)  Indication for Insertion or Continuance of Line Home intravenous therapies (PICC only) 10/26/2017  9:00 AM  Exposed Catheter (cm) 0 cm 10/26/2017  9:00 AM  Site Assessment Clean;Dry;Intact 10/26/2017  9:00 AM  Line Status Flushed;Blood return noted 10/26/2017  9:00 AM  Dressing Type Transparent 10/26/2017  9:00 AM  Dressing Status Clean;Dry;Intact;Antimicrobial disc in place 10/26/2017  9:00 AM  Dressing Change Due 11/02/17 10/26/2017  9:00 AM       Stacie GlazeJoyce, Jenina Moening Horton 10/26/2017, 10:00 AM

## 2017-11-08 ENCOUNTER — Ambulatory Visit (INDEPENDENT_AMBULATORY_CARE_PROVIDER_SITE_OTHER): Payer: Self-pay | Admitting: Internal Medicine

## 2017-11-08 ENCOUNTER — Encounter: Payer: Self-pay | Admitting: Internal Medicine

## 2017-11-08 DIAGNOSIS — Z95828 Presence of other vascular implants and grafts: Secondary | ICD-10-CM

## 2017-11-08 DIAGNOSIS — N179 Acute kidney failure, unspecified: Secondary | ICD-10-CM

## 2017-11-08 DIAGNOSIS — R7881 Bacteremia: Secondary | ICD-10-CM

## 2017-11-10 ENCOUNTER — Encounter: Payer: Self-pay | Admitting: Internal Medicine

## 2017-11-10 DIAGNOSIS — Z95828 Presence of other vascular implants and grafts: Secondary | ICD-10-CM | POA: Insufficient documentation

## 2017-11-10 DIAGNOSIS — Z9889 Other specified postprocedural states: Secondary | ICD-10-CM | POA: Insufficient documentation

## 2017-11-10 NOTE — Assessment & Plan Note (Signed)
Resolved at discharge.  Recent labs ok.

## 2017-11-10 NOTE — Assessment & Plan Note (Signed)
No symptoms of recurrence.   Resolved.

## 2017-11-10 NOTE — Assessment & Plan Note (Signed)
Has been removed and no issues at the site.

## 2017-11-10 NOTE — Progress Notes (Signed)
   Subjective:    Patient ID: Ashley Bradley, female    DOB: 07/12/1950, 67 y.o.   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 TEE negative and on vancomycin for 2 weeks and completed last week.  Picc line removed.  No current fever or chills. No associated diarrhea, rash.  No new complaints.  No new issues.    Review of Systems  Constitutional: Negative for fever.  Gastrointestinal: Negative for diarrhea and nausea.  Skin: Negative for rash.       Objective:   Physical Exam  Constitutional: She appears well-developed and well-nourished. No distress.  Eyes: No scleral icterus.  Cardiovascular: Normal rate, regular rhythm and normal heart sounds.  No murmur heard. Pulmonary/Chest: Effort normal and breath sounds normal. No respiratory distress.  Lymphadenopathy:    She has no cervical adenopathy.  Skin: No rash noted.   SH: no tobacco        Assessment & Plan:

## 2018-01-18 ENCOUNTER — Other Ambulatory Visit: Payer: Self-pay | Admitting: Family Medicine

## 2018-01-18 DIAGNOSIS — N6310 Unspecified lump in the right breast, unspecified quadrant: Secondary | ICD-10-CM

## 2018-01-23 ENCOUNTER — Ambulatory Visit
Admission: RE | Admit: 2018-01-23 | Discharge: 2018-01-23 | Disposition: A | Discharge status: Home / Self Care | Payer: Medicare Other | Source: Ambulatory Visit | Attending: Family Medicine | Admitting: Family Medicine

## 2018-01-23 ENCOUNTER — Other Ambulatory Visit: Payer: Self-pay | Admitting: Family Medicine

## 2018-01-23 DIAGNOSIS — N6320 Unspecified lump in the left breast, unspecified quadrant: Secondary | ICD-10-CM

## 2018-01-23 DIAGNOSIS — N6310 Unspecified lump in the right breast, unspecified quadrant: Secondary | ICD-10-CM

## 2018-07-12 IMAGING — CR DG CHEST 2V
2 series · 2 of 2 positions shown · non-contrast
Comparison: January 26, 2016

CLINICAL DATA: Altered mental status

EXAM:
CHEST  2 VIEW

[x chest ap]
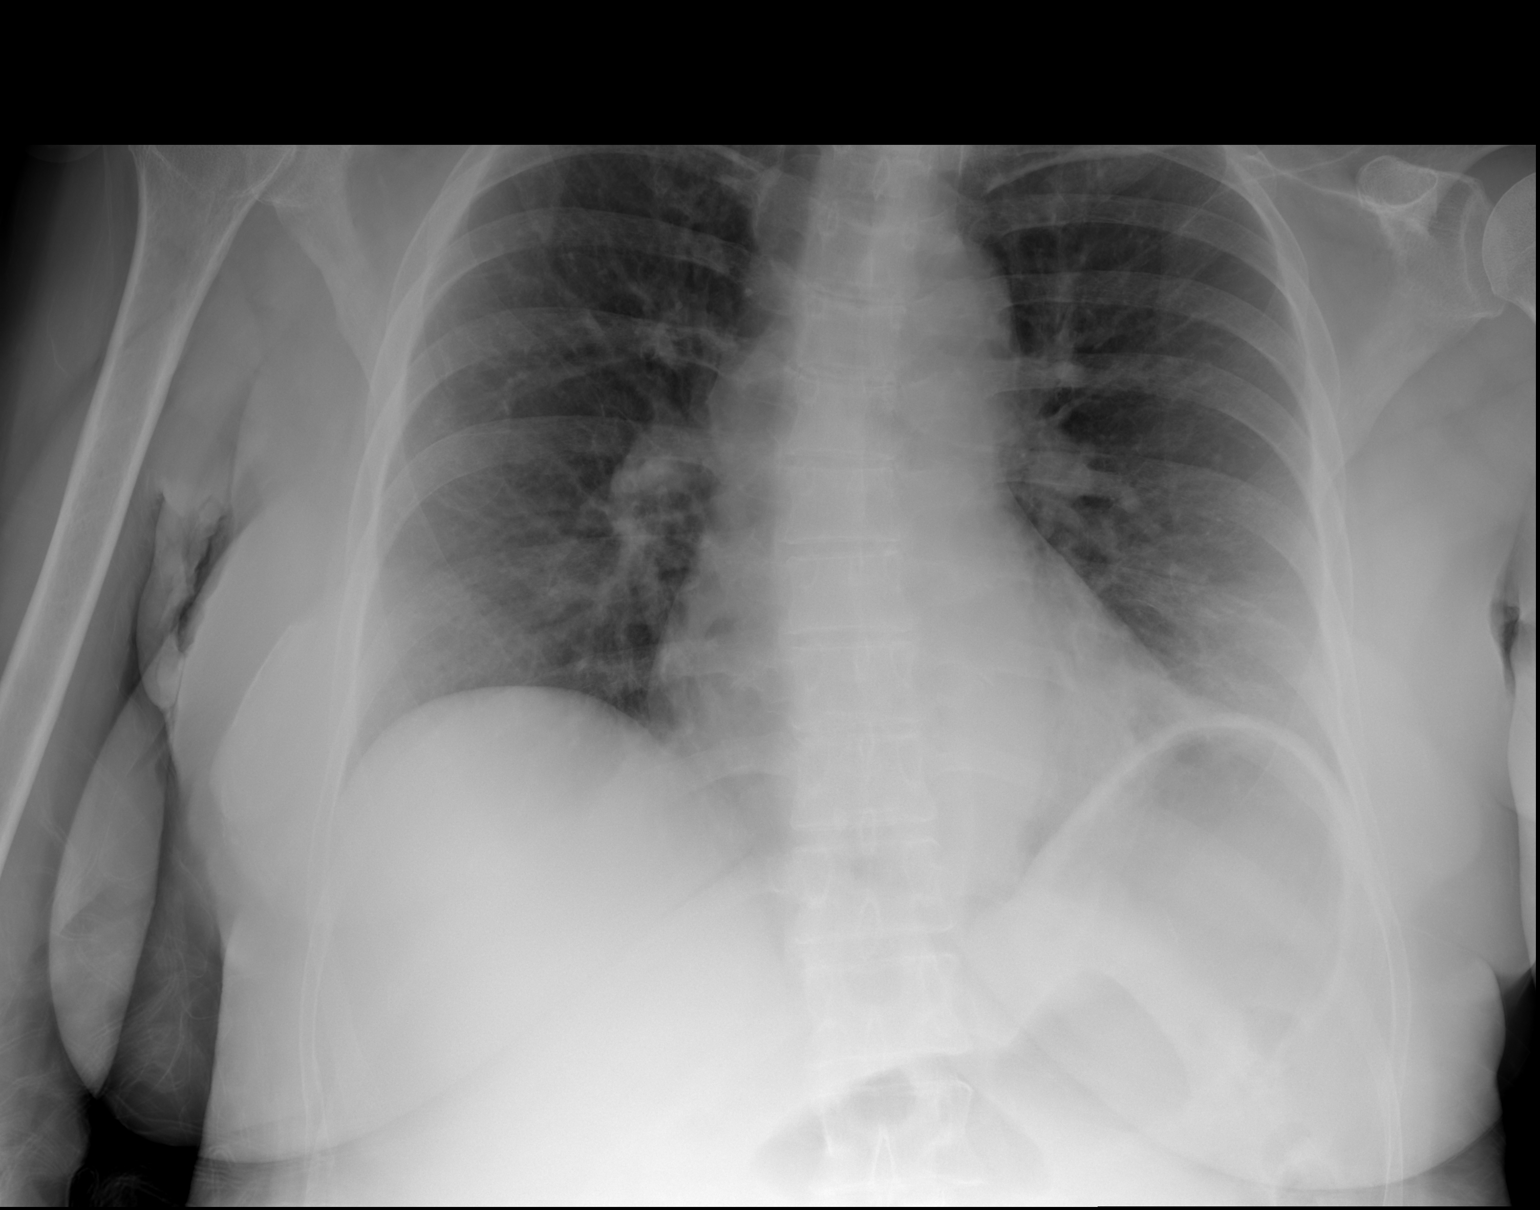

[w chest lat]
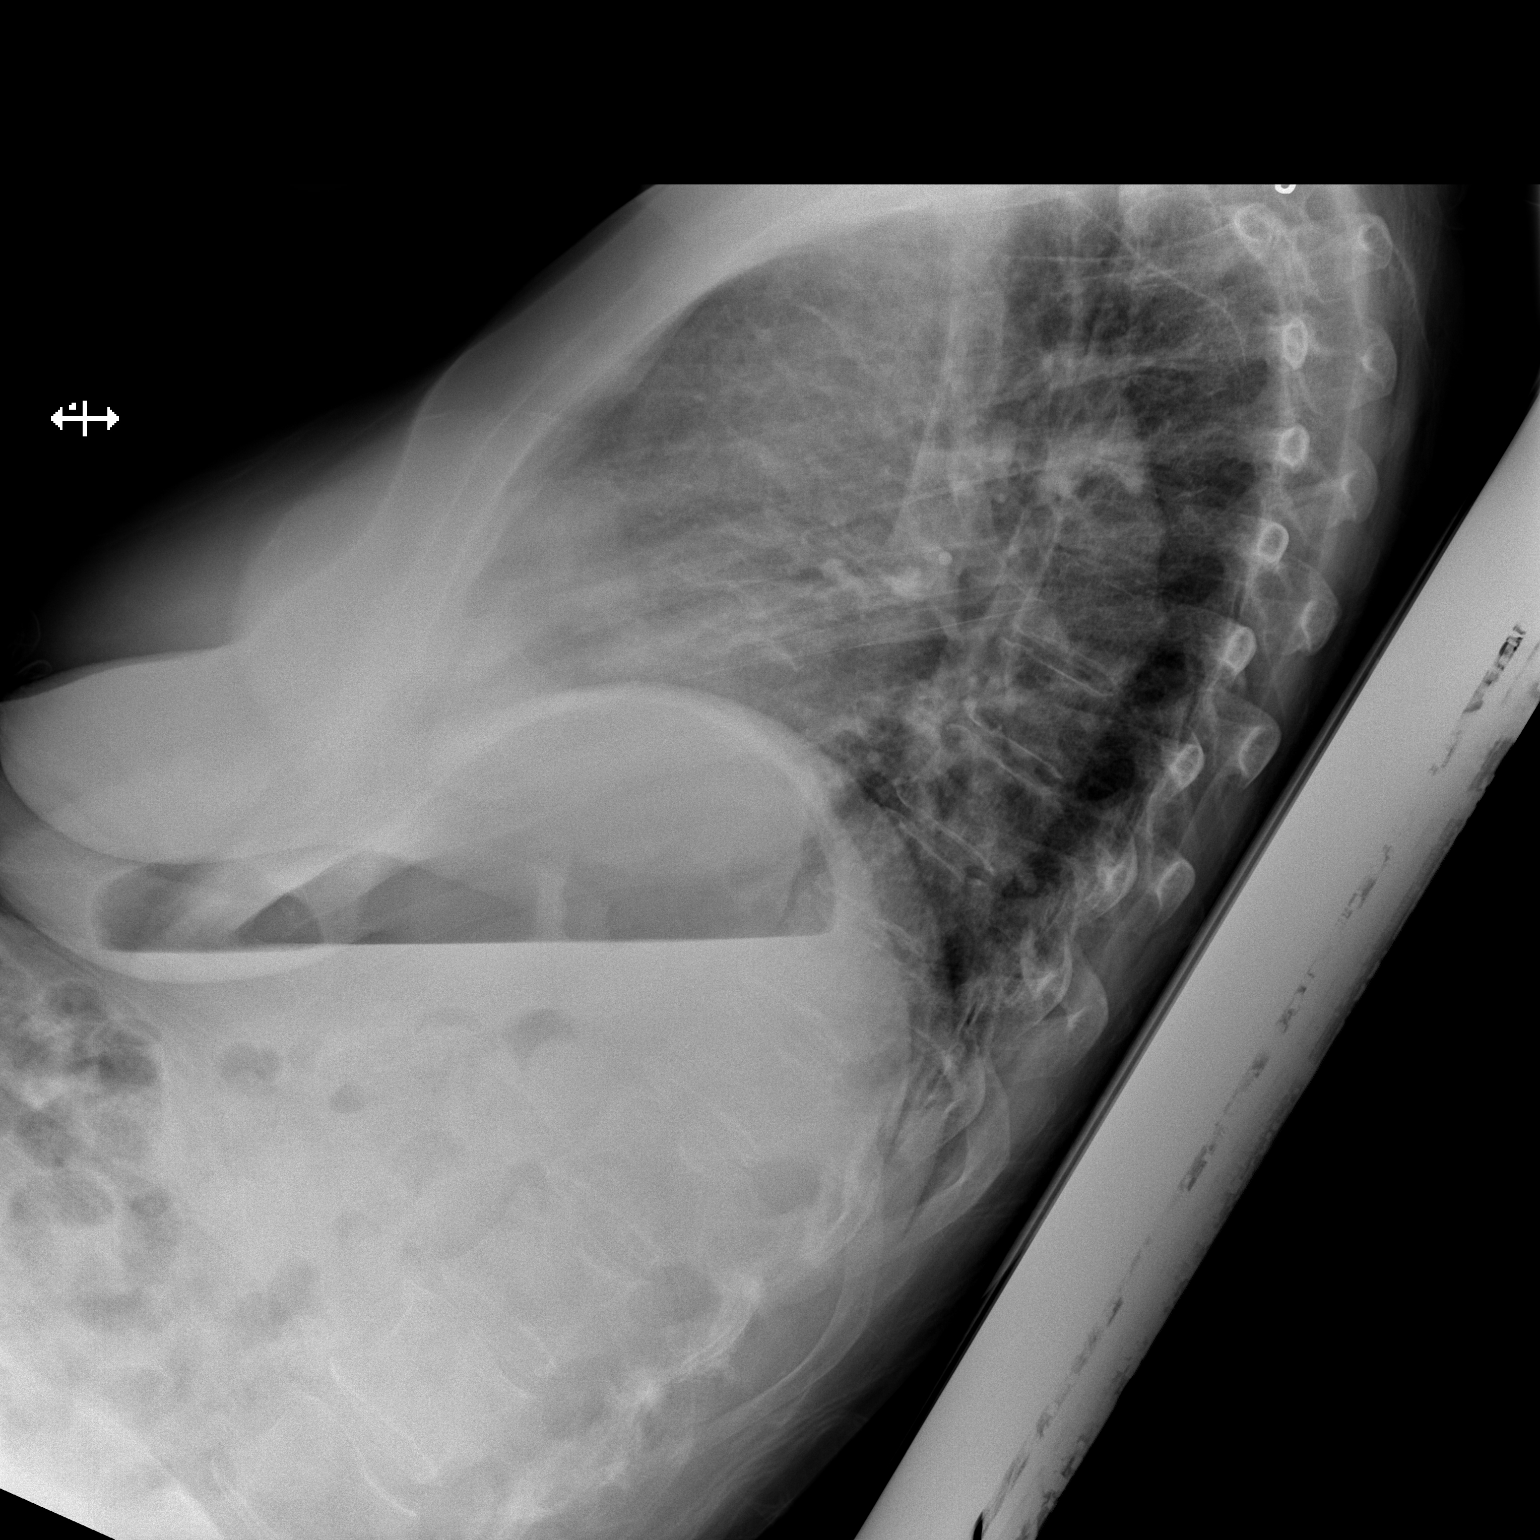

[2 of 2 positions shown; findings below may reference images not displayed]

FINDINGS: There is no edema or consolidation. Heart size and pulmonary
vascularity are normal. No adenopathy. There is postoperative change
in the lower cervical spine. Stomach is somewhat distended with
fluid and air.
IMPRESSION: No edema or consolidation.  Stomach distended with fluid and air.

## 2020-01-24 ENCOUNTER — Inpatient Hospital Stay (HOSPITAL_COMMUNITY): Payer: Medicare Other

## 2020-01-24 ENCOUNTER — Other Ambulatory Visit: Payer: Self-pay

## 2020-01-24 ENCOUNTER — Encounter (HOSPITAL_COMMUNITY): Payer: Self-pay | Admitting: Emergency Medicine

## 2020-01-24 ENCOUNTER — Inpatient Hospital Stay (HOSPITAL_COMMUNITY)
Admission: EM | Admit: 2020-01-24 | Discharge: 2020-02-03 | DRG: 896 | Disposition: A | Discharge status: Skilled Nursing Facility | Payer: Medicare Other | Attending: Internal Medicine | Admitting: Internal Medicine

## 2020-01-24 ENCOUNTER — Emergency Department (HOSPITAL_COMMUNITY): Payer: Medicare Other

## 2020-01-24 DIAGNOSIS — N179 Acute kidney failure, unspecified: Secondary | ICD-10-CM

## 2020-01-24 DIAGNOSIS — D649 Anemia, unspecified: Secondary | ICD-10-CM | POA: Diagnosis present

## 2020-01-24 DIAGNOSIS — F10231 Alcohol dependence with withdrawal delirium: Principal | ICD-10-CM | POA: Diagnosis present

## 2020-01-24 DIAGNOSIS — K7010 Alcoholic hepatitis without ascites: Secondary | ICD-10-CM | POA: Diagnosis present

## 2020-01-24 DIAGNOSIS — E872 Acidosis: Secondary | ICD-10-CM | POA: Diagnosis not present

## 2020-01-24 DIAGNOSIS — Z79899 Other long term (current) drug therapy: Secondary | ICD-10-CM | POA: Diagnosis not present

## 2020-01-24 DIAGNOSIS — G809 Cerebral palsy, unspecified: Secondary | ICD-10-CM | POA: Diagnosis present

## 2020-01-24 DIAGNOSIS — F39 Unspecified mood [affective] disorder: Secondary | ICD-10-CM | POA: Diagnosis present

## 2020-01-24 DIAGNOSIS — R7989 Other specified abnormal findings of blood chemistry: Secondary | ICD-10-CM

## 2020-01-24 DIAGNOSIS — K219 Gastro-esophageal reflux disease without esophagitis: Secondary | ICD-10-CM | POA: Diagnosis present

## 2020-01-24 DIAGNOSIS — Z7982 Long term (current) use of aspirin: Secondary | ICD-10-CM | POA: Diagnosis not present

## 2020-01-24 DIAGNOSIS — E876 Hypokalemia: Secondary | ICD-10-CM | POA: Diagnosis present

## 2020-01-24 DIAGNOSIS — Z20822 Contact with and (suspected) exposure to covid-19: Secondary | ICD-10-CM | POA: Diagnosis present

## 2020-01-24 DIAGNOSIS — G9341 Metabolic encephalopathy: Secondary | ICD-10-CM | POA: Diagnosis present

## 2020-01-24 DIAGNOSIS — F10239 Alcohol dependence with withdrawal, unspecified: Secondary | ICD-10-CM

## 2020-01-24 DIAGNOSIS — K7040 Alcoholic hepatic failure without coma: Secondary | ICD-10-CM | POA: Diagnosis present

## 2020-01-24 DIAGNOSIS — R569 Unspecified convulsions: Secondary | ICD-10-CM | POA: Diagnosis not present

## 2020-01-24 DIAGNOSIS — I471 Supraventricular tachycardia: Secondary | ICD-10-CM | POA: Diagnosis present

## 2020-01-24 DIAGNOSIS — I10 Essential (primary) hypertension: Secondary | ICD-10-CM | POA: Diagnosis present

## 2020-01-24 DIAGNOSIS — F10931 Alcohol use, unspecified with withdrawal delirium: Secondary | ICD-10-CM

## 2020-01-24 DIAGNOSIS — G4089 Other seizures: Secondary | ICD-10-CM | POA: Diagnosis present

## 2020-01-24 DIAGNOSIS — E871 Hypo-osmolality and hyponatremia: Secondary | ICD-10-CM | POA: Diagnosis present

## 2020-01-24 DIAGNOSIS — E8729 Other acidosis: Secondary | ICD-10-CM

## 2020-01-24 LAB — COMPREHENSIVE METABOLIC PANEL
ALT: 92 U/L — ABNORMAL HIGH (ref 0–44)
AST: 170 U/L — ABNORMAL HIGH (ref 15–41)
Albumin: 5.1 g/dL — ABNORMAL HIGH (ref 3.5–5.0)
Alkaline Phosphatase: 139 U/L — ABNORMAL HIGH (ref 38–126)
Anion gap: 23 — ABNORMAL HIGH (ref 5–15)
BUN: 11 mg/dL (ref 8–23)
CO2: 19 mmol/L — ABNORMAL LOW (ref 22–32)
Calcium: 10.6 mg/dL — ABNORMAL HIGH (ref 8.9–10.3)
Chloride: 86 mmol/L — ABNORMAL LOW (ref 98–111)
Creatinine, Ser: 0.53 mg/dL (ref 0.44–1.00)
GFR calc Af Amer: >60 mL/min (ref >60)
GFR calc non Af Amer: >60 mL/min (ref >60)
Glucose, Bld: 133 mg/dL — ABNORMAL HIGH (ref 70–99)
Potassium: 5.3 mmol/L — ABNORMAL HIGH (ref 3.5–5.1)
Sodium: 128 mmol/L — ABNORMAL LOW (ref 135–145)
Total Bilirubin: 2 mg/dL — ABNORMAL HIGH (ref 0.3–1.2)
Total Protein: 8.7 g/dL — ABNORMAL HIGH (ref 6.5–8.1)

## 2020-01-24 LAB — BASIC METABOLIC PANEL
Anion gap: 8 (ref 5–15)
BUN: 11 mg/dL (ref 8–23)
CO2: 23 mmol/L (ref 22–32)
Calcium: 8 mg/dL — ABNORMAL LOW (ref 8.9–10.3)
Chloride: 104 mmol/L (ref 98–111)
Creatinine, Ser: 0.46 mg/dL (ref 0.44–1.00)
GFR calc Af Amer: >60 mL/min (ref >60)
GFR calc non Af Amer: >60 mL/min (ref >60)
Glucose, Bld: 86 mg/dL (ref 70–99)
Potassium: 3.3 mmol/L — ABNORMAL LOW (ref 3.5–5.1)
Sodium: 135 mmol/L (ref 135–145)

## 2020-01-24 LAB — URINALYSIS, ROUTINE W REFLEX MICROSCOPIC
Bilirubin Urine: NEGATIVE
Glucose, UA: NEGATIVE mg/dL
Ketones, ur: 20 mg/dL — AB
Leukocytes,Ua: NEGATIVE
Nitrite: NEGATIVE
Protein, ur: 100 mg/dL — AB
Specific Gravity, Urine: 1.021 (ref 1.005–1.030)
pH: 5 (ref 5.0–8.0)

## 2020-01-24 LAB — SARS CORONAVIRUS 2 BY RT PCR (HOSPITAL ORDER, PERFORMED IN ~~LOC~~ HOSPITAL LAB): SARS Coronavirus 2: NEGATIVE

## 2020-01-24 LAB — MAGNESIUM: Magnesium: 2.4 mg/dL (ref 1.7–2.4)

## 2020-01-24 LAB — HEMOGLOBIN A1C
Hgb A1c MFr Bld: 4.9 % (ref 4.8–5.6)
Mean Plasma Glucose: 93.93 mg/dL

## 2020-01-24 LAB — CBC WITH DIFFERENTIAL/PLATELET
Abs Immature Granulocytes: 0.03 10*3/uL (ref 0.00–0.07)
Basophils Absolute: 0.1 10*3/uL (ref 0.0–0.1)
Basophils Relative: 1 %
Eosinophils Absolute: 0 10*3/uL (ref 0.0–0.5)
Eosinophils Relative: 0 %
HCT: 43 % (ref 36.0–46.0)
Hemoglobin: 14.4 g/dL (ref 12.0–15.0)
Immature Granulocytes: 0 %
Lymphocytes Relative: 20 %
Lymphs Abs: 1.7 10*3/uL (ref 0.7–4.0)
MCH: 33.8 pg (ref 26.0–34.0)
MCHC: 33.5 g/dL (ref 30.0–36.0)
MCV: 100.9 fL — ABNORMAL HIGH (ref 80.0–100.0)
Monocytes Absolute: 1.2 10*3/uL — ABNORMAL HIGH (ref 0.1–1.0)
Monocytes Relative: 14 %
Neutro Abs: 5.4 10*3/uL (ref 1.7–7.7)
Neutrophils Relative %: 65 %
Platelets: 207 10*3/uL (ref 150–400)
RBC: 4.26 MIL/uL (ref 3.87–5.11)
RDW: 14.3 % (ref 11.5–15.5)
WBC: 8.5 10*3/uL (ref 4.0–10.5)
nRBC: 0 % (ref 0.0–0.2)

## 2020-01-24 LAB — RAPID URINE DRUG SCREEN, HOSP PERFORMED
Amphetamines: NOT DETECTED
Barbiturates: NOT DETECTED
Benzodiazepines: NOT DETECTED
Cocaine: NOT DETECTED
Opiates: NOT DETECTED
Tetrahydrocannabinol: NOT DETECTED

## 2020-01-24 LAB — PROTIME-INR
INR: 1.1 (ref 0.8–1.2)
Prothrombin Time: 13.4 seconds (ref 11.4–15.2)

## 2020-01-24 LAB — TRIGLYCERIDES: Triglycerides: 34 mg/dL (ref <150)

## 2020-01-24 LAB — MRSA PCR SCREENING: MRSA by PCR: NEGATIVE

## 2020-01-24 LAB — HEPATIC FUNCTION PANEL
ALT: 63 U/L — ABNORMAL HIGH (ref 0–44)
AST: 94 U/L — ABNORMAL HIGH (ref 15–41)
Albumin: 3.4 g/dL — ABNORMAL LOW (ref 3.5–5.0)
Alkaline Phosphatase: 93 U/L (ref 38–126)
Bilirubin, Direct: 0.2 mg/dL (ref 0.0–0.2)
Indirect Bilirubin: 1 mg/dL — ABNORMAL HIGH (ref 0.3–0.9)
Total Bilirubin: 1.2 mg/dL (ref 0.3–1.2)
Total Protein: 5.5 g/dL — ABNORMAL LOW (ref 6.5–8.1)

## 2020-01-24 LAB — CBG MONITORING, ED
Glucose-Capillary: 102 mg/dL — ABNORMAL HIGH (ref 70–99)
Glucose-Capillary: 103 mg/dL — ABNORMAL HIGH (ref 70–99)

## 2020-01-24 LAB — ETHANOL: Alcohol, Ethyl (B): <10 mg/dL (ref <10)

## 2020-01-24 LAB — AMMONIA
Ammonia: 102 umol/L — ABNORMAL HIGH (ref 9–35)
Ammonia: 17 umol/L (ref 9–35)

## 2020-01-24 LAB — HEPATITIS PANEL, ACUTE
HCV Ab: NONREACTIVE
Hep A IgM: NONREACTIVE
Hep B C IgM: NONREACTIVE
Hepatitis B Surface Ag: NONREACTIVE

## 2020-01-24 LAB — LIPASE, BLOOD: Lipase: 21 U/L (ref 11–51)

## 2020-01-24 LAB — GLUCOSE, CAPILLARY
Glucose-Capillary: 86 mg/dL (ref 70–99)
Glucose-Capillary: 78 mg/dL (ref 70–99)
Glucose-Capillary: 83 mg/dL (ref 70–99)

## 2020-01-24 LAB — HIV ANTIBODY (ROUTINE TESTING W REFLEX): HIV Screen 4th Generation wRfx: NONREACTIVE

## 2020-01-24 LAB — SALICYLATE LEVEL: Salicylate Lvl: <7 mg/dL — ABNORMAL LOW (ref 7.0–30.0)

## 2020-01-24 LAB — LACTIC ACID, PLASMA: Lactic Acid, Venous: 1 mmol/L (ref 0.5–1.9)

## 2020-01-24 LAB — ACETAMINOPHEN LEVEL: Acetaminophen (Tylenol), Serum: <10 ug/mL — ABNORMAL LOW (ref 10–30)

## 2020-01-24 LAB — BETA-HYDROXYBUTYRIC ACID: Beta-Hydroxybutyric Acid: 0.91 mmol/L — ABNORMAL HIGH (ref 0.05–0.27)

## 2020-01-24 LAB — AMYLASE: Amylase: 32 U/L (ref 28–100)

## 2020-01-24 MED ORDER — DOCUSATE SODIUM 100 MG PO CAPS: 100.0000 mg | ORAL_CAPSULE | Freq: Two times a day (BID) | ORAL | Status: DC | PRN

## 2020-01-24 MED ORDER — DEXMEDETOMIDINE HCL IN NACL 200 MCG/50ML IV SOLN
0.4000 ug/kg/h | INTRAVENOUS | Status: DC
  Administered 2020-01-24: 0.4 ug/kg/h via INTRAVENOUS
  Filled 2020-01-24: qty 50

## 2020-01-24 MED ORDER — THIAMINE HCL 100 MG/ML IJ SOLN: 100.0000 mg | Freq: Every day | INTRAMUSCULAR | Status: DC

## 2020-01-24 MED ORDER — THIAMINE HCL 100 MG PO TABS: 100.0000 mg | ORAL_TABLET | Freq: Every day | ORAL | Status: DC

## 2020-01-24 MED ORDER — LORAZEPAM 2 MG/ML IJ SOLN
1.0000 mg | INTRAMUSCULAR | Status: DC | PRN
  Administered 2020-01-24 – 2020-01-25 (×2): 1 mg via INTRAVENOUS
  Filled 2020-01-24 (×2): qty 1

## 2020-01-24 MED ORDER — POTASSIUM CHLORIDE CRYS ER 20 MEQ PO TBCR
40.0000 meq | EXTENDED_RELEASE_TABLET | Freq: Once | ORAL | Status: AC
  Administered 2020-01-24: 40 meq via ORAL
  Filled 2020-01-24: qty 2

## 2020-01-24 MED ORDER — LACTULOSE 10 GM/15ML PO SOLN
30.0000 g | Freq: Two times a day (BID) | ORAL | Status: DC
  Administered 2020-01-24 (×2): 30 g via ORAL
  Filled 2020-01-24: qty 60
  Filled 2020-01-24 (×2): qty 45

## 2020-01-24 MED ORDER — CHLORHEXIDINE GLUCONATE CLOTH 2 % EX PADS
6.0000 | MEDICATED_PAD | Freq: Every day | CUTANEOUS | Status: DC
  Administered 2020-01-24 – 2020-01-25 (×2): 6 via TOPICAL

## 2020-01-24 MED ORDER — DEXMEDETOMIDINE HCL IN NACL 200 MCG/50ML IV SOLN
0.2000 ug/kg/h | INTRAVENOUS | Status: DC
  Administered 2020-01-24 (×2): 0.2 ug/kg/h via INTRAVENOUS
  Filled 2020-01-24 (×2): qty 50

## 2020-01-24 MED ORDER — FOLIC ACID 5 MG/ML IJ SOLN: 1.0000 mg | Freq: Every day | INTRAMUSCULAR | Status: DC

## 2020-01-24 MED ORDER — THIAMINE HCL 100 MG PO TABS
100.0000 mg | ORAL_TABLET | Freq: Every day | ORAL | Status: DC
  Administered 2020-01-24: 100 mg via ORAL
  Filled 2020-01-24: qty 1

## 2020-01-24 MED ORDER — FOLIC ACID 1 MG PO TABS
1.0000 mg | ORAL_TABLET | Freq: Every day | ORAL | Status: DC
  Administered 2020-01-24: 1 mg via ORAL
  Filled 2020-01-24: qty 1

## 2020-01-24 MED ORDER — SODIUM CHLORIDE 0.9 % IV BOLUS
500.0000 mL | Freq: Once | INTRAVENOUS | Status: AC
  Administered 2020-01-24: 500 mL via INTRAVENOUS

## 2020-01-24 MED ORDER — LACTATED RINGERS IV BOLUS
1000.0000 mL | Freq: Once | INTRAVENOUS | Status: AC
  Administered 2020-01-24: 1000 mL via INTRAVENOUS

## 2020-01-24 MED ORDER — LORAZEPAM 2 MG/ML IJ SOLN
INTRAMUSCULAR | Status: AC
  Administered 2020-01-24: 2 mg
  Filled 2020-01-24: qty 1

## 2020-01-24 MED ORDER — M.V.I. ADULT IV INJ
INJECTION | Freq: Once | INTRAVENOUS | Status: AC
  Filled 2020-01-24: qty 1000

## 2020-01-24 MED ORDER — POLYETHYLENE GLYCOL 3350 17 G PO PACK: 17.0000 g | PACK | Freq: Every day | ORAL | Status: DC | PRN

## 2020-01-24 MED ORDER — LEVETIRACETAM IN NACL 1000 MG/100ML IV SOLN
1000.0000 mg | Freq: Once | INTRAVENOUS | Status: AC
  Administered 2020-01-24: 1000 mg via INTRAVENOUS
  Filled 2020-01-24: qty 100

## 2020-01-24 MED ORDER — LEVETIRACETAM IN NACL 1000 MG/100ML IV SOLN
1000.0000 mg | Freq: Two times a day (BID) | INTRAVENOUS | Status: DC
  Administered 2020-01-24 – 2020-01-27 (×6): 1000 mg via INTRAVENOUS
  Filled 2020-01-24 (×7): qty 100

## 2020-01-24 MED ORDER — LIP MEDEX EX OINT
TOPICAL_OINTMENT | Freq: Once | CUTANEOUS | Status: AC
  Filled 2020-01-24: qty 7

## 2020-01-24 MED ORDER — HEPARIN SODIUM (PORCINE) 5000 UNIT/ML IJ SOLN
5000.0000 [IU] | Freq: Three times a day (TID) | INTRAMUSCULAR | Status: DC
  Administered 2020-01-24 – 2020-02-03 (×32): 5000 [IU] via SUBCUTANEOUS
  Filled 2020-01-24 (×29): qty 1

## 2020-01-24 MED ORDER — SODIUM CHLORIDE 0.9 % IV SOLN: INTRAVENOUS | Status: DC

## 2020-01-24 MED ORDER — INSULIN ASPART 100 UNIT/ML ~~LOC~~ SOLN
0.0000 [IU] | SUBCUTANEOUS | Status: DC
  Administered 2020-01-25 – 2020-02-02 (×10): 1 [IU] via SUBCUTANEOUS
  Filled 2020-01-24: qty 0.09

## 2020-01-24 NOTE — ED Triage Notes (Signed)
Pt brought via [facility] for seizure activity. EMS reports that pt's husband said patient went rigid, screamed, then passed out. EMS states that patient drank 8-10 small boxes of wine today. Patient reports drinking 4-6 boxes of wine daily. EMS denies any seizure activity with them. Patient has mild upper extremity tremors.

## 2020-01-24 NOTE — ED Notes (Signed)
Report called on patient, however room is not yet clean so will have to wait 20-30 mins before moving patient.  Alexia Freestone, RN charge nurse made aware.

## 2020-01-24 NOTE — Plan of Care (Signed)
RN will continue to monitor patient's progression with care plan. 

## 2020-01-24 NOTE — Plan of Care (Addendum)
69 year old female admitted this AM s/p seizures.   Admits to 3-4 "little boxes" of wine a day. Stopped 2-3 days ago. Now on exam patient is alert, oriented, and following commands, noted to have slight termor to upper extremities. Denies nausea, hallucinations, or headache.   Also noted to have elevated LFT and Ammonia Levels. Lactulose is scheduled. Patient reports multiple loose bowel movements today. Will obtain U/S of ABD and after patient can eat. STAT BMP ordered as on admission she was noted to have hyponatremia and increased anion gap acidosis. Will follow on this and U/S results.   Husband updated on plan of care at bedside.   Attending note: I have seen and examined the patient. History, labs and imaging reviewed. Agree with assessment above  Chilton Greathouse MD [facility] 01/24/2020, 4:23 PM

## 2020-01-24 NOTE — H&P (Signed)
NAME:  Ashley Bradley, MRN:  [id number], DOB:  May 06, 1951, LOS: 0 ADMISSION DATE:  01/24/2020, CONSULTATION DATE: January 24, 2020 REFERRING MD: Dr. Nicholes Stairs, CHIEF COMPLAINT: Weakness, seizure  Brief History   68 year old female presented to the [facility] long emergency room complaining of weakness, desire to stop drinking alcohol.  Had a seizure in the [facility] long emergency room and exhibited signs of alcohol withdrawal.  Pulmonary and critical care medicine consulted for admission.  History of present illness   This is a 69 year old female who says she came to the emergency room "because she got herself in trouble from drinking too much".  Her husband called EMS to bring her in because she was profoundly weak.  She says she has not had a regular meal in days.  She has been drinking "too much".  Her last drink of alcohol was yesterday.  In the emergency room she screamed out and had bilateral extremity convulsive type movements.  She was given Ativan and Keppra.  She was started on Precedex for presumed alcohol withdrawal.  Labs were notable for hyponatremia, and increased anion gap, and elevated LFTs.  Pulmonary and critical care medicine was consulted for admission.  Of note, the patient was confused and unable to provide a reliable history.  Past Medical History  Seizures, alcohol withdrawal related Hypertension Depression Alcohol abuse  Significant Hospital Events   July 23 admitted  Consults:    Procedures:    Significant Diagnostic Tests:  July 23 CT head no acute intracranial process  Micro Data:  July 23 SARS-CoV-2 negative  Antimicrobials:    Interim history/subjective:    Objective   Blood pressure (!) 83/44, pulse 88, temperature 98.8 F (37.1 C), resp. rate (!) 24, height 5' 5"  (1.651 m), weight 72.6 kg, SpO2 95 %.        Intake/Output Summary (Last 24 hours) at 01/24/2020 7353 Last data filed at 01/24/2020 2992 Gross per 24 hour  Intake 620.54 ml  Output --   Net 620.54 ml   Filed Weights   01/24/20 0141  Weight: 72.6 kg    Examination:  General:  In bed, sleeping HENT: NCAT MM dry, blood dried around lips PULM: CTA B, normal effort CV: tachycardia, no mgr GI: BS+, soft, nontender MSK: normal bulk and tone Neuro: Sleeping but arouses to voice, answers some questions, knows that she is at [facility] long hospital, knows that she came because of alcohol abuse, otherwise not oriented to date, moves all four extremities well   [place].m list     Assessment & Plan:  Delirium tremens with alcohol withdrawal seizure: Keppra twice daily Order EEG if seizure occurs again ICU alcohol withdrawal protocol with Precedex, as needed Versed, thiamine, folate Admit to ICU for closer monitoring  Acute metabolic encephalopathy secondary to alcohol withdrawal complicated by hepatic encephalopathy: Lactulose scheduled, titrate to 3 stools a day  Increased anion gap metabolic acidosis: Possibly alcohol ketosis considering poor p.o. intake but work-up incomplete. Check beta hydroxybutyrate Check urine ketones Check lactic acid  Hyponatremia: Possibly related to alcohol abuse Free water restrict Follow sodium Work-up if no improvement with free water restriction N.p.o.  Alcoholic hepatitis: Follow LFTs Check coags Follow up discriminant function  Best practice:  Diet: npo Pain/Anxiety/Delirium protocol (if indicated): n/a VAP protocol (if indicated): n/a DVT prophylaxis: sub q heparin GI prophylaxis: n/a Glucose control: SSI Mobility: bed rest Code Status: full Family Communication: none bedside Disposition:   Labs   CBC: Recent Labs  Lab 01/24/20  0111  WBC 8.5  NEUTROABS 5.4  HGB 14.4  HCT 43.0  MCV 100.9*  PLT 053    Basic Metabolic Panel: Recent Labs  Lab 01/24/20 0111  NA 128*  K 5.3*  CL 86*  CO2 19*  GLUCOSE 133*  BUN 11  CREATININE 0.53  CALCIUM 10.6*   GFR: Estimated Creatinine  Clearance: 65.3 mL/min (by C-G formula based on SCr of 0.53 mg/dL). Recent Labs  Lab 01/24/20 0111  WBC 8.5    Liver Function Tests: Recent Labs  Lab 01/24/20 0111  AST 170*  ALT 92*  ALKPHOS 139*  BILITOT 2.0*  PROT 8.7*  ALBUMIN 5.1*   No results for input(s): LIPASE, AMYLASE in the last 168 hours. Recent Labs  Lab 01/24/20 0111  AMMONIA 102*    ABG    Component Value Date/Time   PHART 7.367 10/13/2017 1725   PCO2ART 23.8 (L) 10/13/2017 1725   PO2ART 335 (H) 10/13/2017 1725   HCO3 13.3 (L) 10/13/2017 1725   TCO2 42 (H) 10/17/2017 0623   ACIDBASEDEF 10.4 (H) 10/13/2017 1725   O2SAT 100.0 10/13/2017 1725     Coagulation Profile: No results for input(s): INR, PROTIME in the last 168 hours.  Cardiac Enzymes: No results for input(s): CKTOTAL, CKMB, CKMBINDEX, TROPONINI in the last 168 hours.  HbA1C: Hgb A1c MFr Bld  Date/Time Value Ref Range Status  01/26/2016 03:55 PM 4.9 4.8 - 5.6 % Final    Comment:    (NOTE)         Pre-diabetes: 5.7 - 6.4         Diabetes: >6.4         Glycemic control for adults with diabetes: <7.0   01/21/2016 05:13 AM 4.9 4.8 - 5.6 % Final    Comment:    (NOTE)         Pre-diabetes: 5.7 - 6.4         Diabetes: >6.4         Glycemic control for adults with diabetes: <7.0     CBG: No results for input(s): GLUCAP in the last 168 hours.  Review of Systems:   Cannot obtain due to confusion  Past Medical History  She,  has a past medical history of Alcoholism ([place]), Complication of anesthesia, CP (cerebral palsy) ([place]), Depression, Hypertension, PONV (postoperative nausea and vomiting), and Seizures ([place]).   Surgical History    Past Surgical History:  Procedure Laterality Date  . BLADDER SURGERY    . BREAST SURGERY    . ESOPHAGOGASTRODUODENOSCOPY (EGD) WITH PROPOFOL N/A 10/21/2017   Procedure: ESOPHAGOGASTRODUODENOSCOPY (EGD) WITH PROPOFOL;  Surgeon: Clarene Essex, MD;  Location: [facility] ENDOSCOPY;  Service: Endoscopy;  Laterality:  N/A;  . TEE WITHOUT CARDIOVERSION N/A 10/24/2017   Procedure: TRANSESOPHAGEAL ECHOCARDIOGRAM (TEE);  Surgeon: Herminio Commons, MD;  Location: [facility] ENDOSCOPY;  Service: Cardiovascular;  Laterality: N/A;  . TUBAL LIGATION       Social History   reports that she has never smoked. She has never used smokeless tobacco. She reports current alcohol use. She reports that she does not use drugs.   Family History   Her family history includes CAD (age of onset: 38) in her mother; Lung cancer (age of onset: 31) in her father.   Allergies Allergies  Allergen Reactions  . Macrodantin [Nitrofurantoin] Nausea And Vomiting     Home Medications  Prior to Admission medications   Medication Sig Start Date End Date Taking? Authorizing Provider  aspirin EC 81 MG tablet Take  81 mg by mouth daily.    [provider]  FLUoxetine (PROZAC) 20 MG capsule Take 1 capsule (20 mg total) by mouth daily. Patient taking differently: Take 40 mg by mouth daily.  01/29/16   Debbe Odea, MD  folic acid (FOLVITE) 1 MG tablet Take 1 tablet (1 mg total) by mouth daily. 10/26/17   Aline August, MD  loperamide (IMODIUM) 2 MG capsule Take 1 capsule (2 mg total) by mouth every 8 (eight) hours as needed for diarrhea or loose stools. 10/26/17   Aline August, MD  losartan (COZAAR) 50 MG tablet Take 1 tablet (50 mg total) by mouth daily. 01/29/16   Debbe Odea, MD  Multiple Vitamin (MULTIVITAMIN WITH MINERALS) TABS tablet Take 1 tablet by mouth daily. Patient not taking: Reported on 10/13/2017 01/23/16   Dana Allan I, MD  pantoprazole (PROTONIX) 40 MG tablet Take 1 tablet (40 mg total) by mouth daily. 10/27/17   Aline August, MD  saccharomyces boulardii (FLORASTOR) 250 MG capsule Take 250 mg by mouth 2 (two) times daily.    [provider]  thiamine 100 MG tablet Take 1 tablet (100 mg total) by mouth daily. 10/26/17   Aline August, MD  vancomycin IVPB Inject 1,250 mg into the vein daily. Indication:   bacteremia Last Day of Therapy:  11/03/17 Labs - Sunday/Monday:  CBC/D, BMP, and vancomycin trough. Labs - Thursday:  BMP and vancomycin trough Labs - Every other week:  ESR and CRP 10/26/17   Aline August, MD     Critical care time: 35 minutes    Roselie Awkward, MD [place] Pager: [phone] Cell: [phone] If no response, call [phone]

## 2020-01-24 NOTE — ED Provider Notes (Signed)
[facility] DEPT Provider Note   CSN: [id number] Arrival date & time: 01/24/20  0013     History Chief Complaint  Patient presents with  . Seizures    Ashley Bradley is a 69 y.o. female.  The history is provided by the EMS personnel. The history is limited by the condition of the patient.  Seizures Seizure activity on arrival: no   Seizure type:  Tonic (screamed out and then was tense ) Preceding symptoms: no sensation of an aura present   Preceding symptoms comment:  Cried out  Initial focality:  None Episode characteristics: eye deviation   Episode characteristics: no abnormal movements, no combativeness and no confusion   Postictal symptoms: no confusion   Severity:  Mild Timing:  Once Number of seizures this episode:  1 Progression:  Resolved Context: alcohol withdrawal and cerebral palsy   Recent head injury:  No recent head injuries PTA treatment:  None Patient with known alcoholism presents after drinking 8-10 small wine boxes today and then screaming out and having a tonic episode at home per reports.       Past Medical History:  Diagnosis Date  . Alcoholism ([place])   . Complication of anesthesia    hard time waking up  . CP (cerebral palsy) ([place])   . Depression   . Hypertension   . PONV (postoperative nausea and vomiting)   . Seizures ([place])    ETOH induced    Patient Active Problem List   Diagnosis Date Noted  . High anion gap metabolic acidosis 62/95/2841  . Melena 06/29/2016  . Syncope 01/26/2016  . Prolonged Q-T interval on ECG 01/26/2016  . History of Clostridium difficile colitis 01/26/2016  . Elevated troponin   . Alcohol use disorder, moderate, in early remission, dependence ([place]) 01/21/2016  . AKI (acute kidney injury) ([place]) 01/16/2016  . Seizures ([place]) 09/24/2013  . Hyponatremia 09/20/2013  . Major depression 03/19/2013  . Alcohol intoxication in relapsed alcoholic ([place]) 32/44/0102    Past Surgical  History:  Procedure Laterality Date  . BLADDER SURGERY    . BREAST SURGERY    . ESOPHAGOGASTRODUODENOSCOPY (EGD) WITH PROPOFOL N/A 10/21/2017   Procedure: ESOPHAGOGASTRODUODENOSCOPY (EGD) WITH PROPOFOL;  Surgeon: Clarene Essex, MD;  Location: [facility] ENDOSCOPY;  Service: Endoscopy;  Laterality: N/A;  . TEE WITHOUT CARDIOVERSION N/A 10/24/2017   Procedure: TRANSESOPHAGEAL ECHOCARDIOGRAM (TEE);  Surgeon: Herminio Commons, MD;  Location: [facility] ENDOSCOPY;  Service: Cardiovascular;  Laterality: N/A;  . TUBAL LIGATION       OB History   No obstetric history on file.     Family History  Problem Relation Age of Onset  . CAD Mother 45  . Lung cancer Father 59    Social History   Tobacco Use  . Smoking status: Never Smoker  . Smokeless tobacco: Never Used  Substance Use Topics  . Alcohol use: Yes    Comment: 1-2 bottles wine per day  . Drug use: No    Home Medications Prior to Admission medications   Medication Sig Start Date End Date Taking? Authorizing Provider  aspirin EC 81 MG tablet Take 81 mg by mouth daily.    [provider]  FLUoxetine (PROZAC) 20 MG capsule Take 1 capsule (20 mg total) by mouth daily. Patient taking differently: Take 40 mg by mouth daily.  01/29/16   Debbe Odea, MD  folic acid (FOLVITE) 1 MG tablet Take 1 tablet (1 mg total) by mouth daily. 10/26/17   Aline August, MD  loperamide (IMODIUM) 2 MG capsule Take 1 capsule (2 mg total) by mouth every 8 (eight) hours as needed for diarrhea or loose stools. 10/26/17   Aline August, MD  losartan (COZAAR) 50 MG tablet Take 1 tablet (50 mg total) by mouth daily. 01/29/16   Debbe Odea, MD  Multiple Vitamin (MULTIVITAMIN WITH MINERALS) TABS tablet Take 1 tablet by mouth daily. Patient not taking: Reported on 10/13/2017 01/23/16   Dana Allan I, MD  pantoprazole (PROTONIX) 40 MG tablet Take 1 tablet (40 mg total) by mouth daily. 10/27/17   Aline August, MD  saccharomyces boulardii (FLORASTOR) 250 MG capsule  Take 250 mg by mouth 2 (two) times daily.    [provider]  thiamine 100 MG tablet Take 1 tablet (100 mg total) by mouth daily. 10/26/17   Aline August, MD  vancomycin IVPB Inject 1,250 mg into the vein daily. Indication:  bacteremia Last Day of Therapy:  11/03/17 Labs - Sunday/Monday:  CBC/D, BMP, and vancomycin trough. Labs - Thursday:  BMP and vancomycin trough Labs - Every other week:  ESR and CRP 10/26/17   Aline August, MD    Allergies    Macrodantin [nitrofurantoin]  Review of Systems   Review of Systems  Unable to perform ROS: Acuity of condition  Neurological: Positive for seizures.    Physical Exam Updated Vital Signs BP (!) 149/91 (BP Location: Right Arm)   Pulse (!) 153   Temp 98.6 F (37 C) (Rectal)   Resp (!) 26   SpO2 98%   Physical Exam Vitals and nursing note reviewed.  Constitutional:      Appearance: She is not diaphoretic.  HENT:     Head: Normocephalic and atraumatic.     Nose: Nose normal.     Mouth/Throat:     Mouth: Mucous membranes are moist.     Pharynx: Oropharynx is clear.  Eyes:     Conjunctiva/sclera: Conjunctivae normal.     Pupils: Pupils are equal, round, and reactive to light.  Cardiovascular:     Rate and Rhythm: Regular rhythm. Tachycardia present.     Pulses: Normal pulses.     Heart sounds: Normal heart sounds.  Pulmonary:     Effort: Pulmonary effort is normal.     Breath sounds: Normal breath sounds.  Abdominal:     General: There is no distension.     Palpations: Abdomen is soft. There is fluid wave.     Tenderness: There is no guarding. Negative signs include Murphy's sign and McBurney's sign.  Musculoskeletal:        General: No deformity.     Cervical back: Normal range of motion and neck supple.     Right lower leg: No edema.     Left lower leg: No edema.  Skin:    General: Skin is warm and dry.     Capillary Refill: Capillary refill takes less than 2 seconds.  Neurological:     Mental Status: She is  alert.     Deep Tendon Reflexes: Reflexes normal.  Psychiatric:        Mood and Affect: Mood normal.     ED Results / Procedures / Treatments   Labs (all labs ordered are listed, but only abnormal results are displayed) Results for orders placed or performed during the hospital encounter of 01/24/20  SARS Coronavirus 2 by RT PCR (hospital order, performed in [facility] hospital lab) Nasopharyngeal Nasopharyngeal Swab   Specimen: Nasopharyngeal Swab  Result Value Ref Range  SARS Coronavirus 2 NEGATIVE NEGATIVE  CBC with Differential/Platelet  Result Value Ref Range   WBC 8.5 4.0 - 10.5 K/uL   RBC 4.26 3.87 - 5.11 MIL/uL   Hemoglobin 14.4 12.0 - 15.0 g/dL   HCT 43.0 36 - 46 %   MCV 100.9 (H) 80.0 - 100.0 fL   MCH 33.8 26.0 - 34.0 pg   MCHC 33.5 30.0 - 36.0 g/dL   RDW 14.3 11.5 - 15.5 %   Platelets 207 150 - 400 K/uL   nRBC 0.0 0.0 - 0.2 %   Neutrophils Relative % 65 %   Neutro Abs 5.4 1.7 - 7.7 K/uL   Lymphocytes Relative 20 %   Lymphs Abs 1.7 0.7 - 4.0 K/uL   Monocytes Relative 14 %   Monocytes Absolute 1.2 (H) 0 - 1 K/uL   Eosinophils Relative 0 %   Eosinophils Absolute 0.0 0 - 0 K/uL   Basophils Relative 1 %   Basophils Absolute 0.1 0 - 0 K/uL   Immature Granulocytes 0 %   Abs Immature Granulocytes 0.03 0.00 - 0.07 K/uL  Comprehensive metabolic panel  Result Value Ref Range   Sodium 128 (L) 135 - 145 mmol/L   Potassium 5.3 (H) 3.5 - 5.1 mmol/L   Chloride 86 (L) 98 - 111 mmol/L   CO2 19 (L) 22 - 32 mmol/L   Glucose, Bld 133 (H) 70 - 99 mg/dL   BUN 11 8 - 23 mg/dL   Creatinine, Ser 0.53 0.44 - 1.00 mg/dL   Calcium 10.6 (H) 8.9 - 10.3 mg/dL   Total Protein 8.7 (H) 6.5 - 8.1 g/dL   Albumin 5.1 (H) 3.5 - 5.0 g/dL   AST 170 (H) 15 - 41 U/L   ALT 92 (H) 0 - 44 U/L   Alkaline Phosphatase 139 (H) 38 - 126 U/L   Total Bilirubin 2.0 (H) 0.3 - 1.2 mg/dL   GFR calc non Af Amer >60 >60 mL/min   GFR calc Af Amer >60 >60 mL/min   Anion gap 23 (H) 5 - 15  Ammonia  Result  Value Ref Range   Ammonia 102 (H) 9 - 35 umol/L  Acetaminophen level  Result Value Ref Range   Acetaminophen (Tylenol), Serum <10 (L) 10 - 30 ug/mL  Salicylate level  Result Value Ref Range   Salicylate Lvl <6.2 (L) 7.0 - 30.0 mg/dL  Rapid urine drug screen (hospital performed)  Result Value Ref Range   Opiates NONE DETECTED NONE DETECTED   Cocaine NONE DETECTED NONE DETECTED   Benzodiazepines NONE DETECTED NONE DETECTED   Amphetamines NONE DETECTED NONE DETECTED   Tetrahydrocannabinol NONE DETECTED NONE DETECTED   Barbiturates NONE DETECTED NONE DETECTED  Ethanol  Result Value Ref Range   Alcohol, Ethyl (B) <10 <10 mg/dL   CT Head Wo Contrast  Result Date: 01/24/2020 CLINICAL DATA:  Syncope and possible seizure EXAM: CT HEAD WITHOUT CONTRAST TECHNIQUE: Contiguous axial images were obtained from the base of the skull through the vertex without intravenous contrast. COMPARISON:  10/13/2017 FINDINGS: Brain: There is no mass, hemorrhage or extra-axial collection. The size and configuration of the ventricles and extra-axial CSF spaces are normal. The brain parenchyma is normal, without acute or chronic infarction. Vascular: No abnormal hyperdensity of the major intracranial arteries or dural venous sinuses. No intracranial atherosclerosis. Skull: The visualized skull base, calvarium and extracranial soft tissues are normal. Sinuses/Orbits: No fluid levels or advanced mucosal thickening of the visualized paranasal sinuses. No mastoid or middle ear effusion.  The orbits are normal. IMPRESSION: Normal head CT. Electronically Signed   By: Ulyses Jarred M.D.   On: 01/24/2020 02:27   DG Chest Port 1 View  Result Date: 01/24/2020 CLINICAL DATA:  Possible seizure EXAM: PORTABLE CHEST 1 VIEW COMPARISON:  10/16/2017 FINDINGS: The heart size and mediastinal contours are within normal limits. Both lungs are clear. The visualized skeletal structures are unremarkable. IMPRESSION: No active disease.  Electronically Signed   By: Ulyses Jarred M.D.   On: 01/24/2020 02:26   ] EKG  EKG Interpretation  Date/Time:  Friday January 24 2020 00:54:32 EDT Ventricular Rate:  142 PR Interval:    QRS Duration: 85 QT Interval:  291 QTC Calculation: 448 R Axis:   68 Text Interpretation: Sinus tachycardia Confirmed by Randal Buba, Lawanna Cecere ([id number]) on 01/24/2020 1:47:09 AM      Procedures Procedures (including critical care time)  Medications Ordered in ED Medications  dexmedetomidine (PRECEDEX) 200 MCG/50ML (4 mcg/mL) infusion (0.4 mcg/kg/hr  72.6 kg Intravenous New Bag/Given 01/24/20 0220)  LORazepam (ATIVAN) 2 MG/ML injection (2 mg  Given 01/24/20 0108)  levETIRAcetam (KEPPRA) IVPB 1000 mg/100 mL premix (0 mg Intravenous Stopped 01/24/20 0154)  sodium chloride 0.9 % 1,000 mL with thiamine 347 mg, folic acid 1 mg, multivitamins adult 10 mL, magnesium sulfate 2 g infusion ( Intravenous New Bag/Given 01/24/20 0153)  sodium chloride 0.9 % bolus 500 mL (0 mLs Intravenous Stopped 01/24/20 0220)    ED Course  I have reviewed the triage vital signs and the nursing notes.  Pertinent labs & imaging results that were available during my care of the patient were reviewed by me and considered in my medical decision making (see chart for details).   Patient had witnessed seizure in the ED lasted less than a minute, cried out and then became rigid and unresponsive.  Ativan given with cessation of activity.   Patient's tachycardia and BP consistent with DTs, will start precedex drip in the ED to manage symptoms.    HR and BP are better controlled post drip.  Patient is now resting comfortably.    MDM Reviewed: previous chart, nursing note and vitals Interpretation: labs, ECG, x-ray and CT scan (elevated anion gap, negative ETOH ) Total time providing critical care: 75-105 minutes (precedex drip started in the ED, seizure management ). This excludes time spent performing separately reportable procedures and  services. Consults: critical care and neurology (per Dr. Leonel Ramsay a dose od keppra is fine, patient needs an EEG today and [facility] at [facility] )  CRITICAL CARE Performed by: Leen Tworek K Edrick Whitehorn-Rasch Total critical care time: 75 minutes Critical care time was exclusive of separately billable procedures and treating other patients. Critical care was necessary to treat or prevent imminent or life-threatening deterioration. Critical care was time spent personally by me on the following activities: development of treatment plan with patient and/or surrogate as well as nursing, discussions with consultants, evaluation of patient's response to treatment, examination of patient, obtaining history from patient or surrogate, ordering and performing treatments and interventions, ordering and review of laboratory studies, ordering and review of radiographic studies, pulse oximetry and re-evaluation of patient's condition.  Final Clinical Impression(s) / ED Diagnoses Admit to ICU on Precedex drip    Giulietta Prokop, MD 01/24/20 4259

## 2020-01-25 LAB — COMPREHENSIVE METABOLIC PANEL
ALT: 71 U/L — ABNORMAL HIGH (ref 0–44)
ALT: 68 U/L — ABNORMAL HIGH (ref 0–44)
AST: 100 U/L — ABNORMAL HIGH (ref 15–41)
AST: 87 U/L — ABNORMAL HIGH (ref 15–41)
Albumin: 3.8 g/dL (ref 3.5–5.0)
Albumin: 3.8 g/dL (ref 3.5–5.0)
Alkaline Phosphatase: 107 U/L (ref 38–126)
Alkaline Phosphatase: 112 U/L (ref 38–126)
Anion gap: 10 (ref 5–15)
Anion gap: 11 (ref 5–15)
BUN: 10 mg/dL (ref 8–23)
BUN: 10 mg/dL (ref 8–23)
CO2: 21 mmol/L — ABNORMAL LOW (ref 22–32)
CO2: 20 mmol/L — ABNORMAL LOW (ref 22–32)
Calcium: 8.7 mg/dL — ABNORMAL LOW (ref 8.9–10.3)
Calcium: 8.5 mg/dL — ABNORMAL LOW (ref 8.9–10.3)
Chloride: 104 mmol/L (ref 98–111)
Chloride: 105 mmol/L (ref 98–111)
Creatinine, Ser: 0.46 mg/dL (ref 0.44–1.00)
Creatinine, Ser: 0.43 mg/dL — ABNORMAL LOW (ref 0.44–1.00)
GFR calc Af Amer: >60 mL/min (ref >60)
GFR calc Af Amer: >60 mL/min (ref >60)
GFR calc non Af Amer: >60 mL/min (ref >60)
GFR calc non Af Amer: >60 mL/min (ref >60)
Glucose, Bld: 95 mg/dL (ref 70–99)
Glucose, Bld: 125 mg/dL — ABNORMAL HIGH (ref 70–99)
Potassium: 3.5 mmol/L (ref 3.5–5.1)
Potassium: 3.6 mmol/L (ref 3.5–5.1)
Sodium: 135 mmol/L (ref 135–145)
Sodium: 136 mmol/L (ref 135–145)
Total Bilirubin: 0.8 mg/dL (ref 0.3–1.2)
Total Bilirubin: 1 mg/dL (ref 0.3–1.2)
Total Protein: 6.3 g/dL — ABNORMAL LOW (ref 6.5–8.1)
Total Protein: 6.4 g/dL — ABNORMAL LOW (ref 6.5–8.1)

## 2020-01-25 LAB — CBC
HCT: 34.6 % — ABNORMAL LOW (ref 36.0–46.0)
HCT: 34.2 % — ABNORMAL LOW (ref 36.0–46.0)
Hemoglobin: 10.9 g/dL — ABNORMAL LOW (ref 12.0–15.0)
Hemoglobin: 11.1 g/dL — ABNORMAL LOW (ref 12.0–15.0)
MCH: 33.4 pg (ref 26.0–34.0)
MCH: 34 pg (ref 26.0–34.0)
MCHC: 31.5 g/dL (ref 30.0–36.0)
MCHC: 32.5 g/dL (ref 30.0–36.0)
MCV: 106.1 fL — ABNORMAL HIGH (ref 80.0–100.0)
MCV: 104.9 fL — ABNORMAL HIGH (ref 80.0–100.0)
Platelets: 142 10*3/uL — ABNORMAL LOW (ref 150–400)
Platelets: 145 10*3/uL — ABNORMAL LOW (ref 150–400)
RBC: 3.26 MIL/uL — ABNORMAL LOW (ref 3.87–5.11)
RBC: 3.26 MIL/uL — ABNORMAL LOW (ref 3.87–5.11)
RDW: 14.7 % (ref 11.5–15.5)
RDW: 14.6 % (ref 11.5–15.5)
WBC: 4.5 10*3/uL (ref 4.0–10.5)
WBC: 4.7 10*3/uL (ref 4.0–10.5)
nRBC: 0 % (ref 0.0–0.2)
nRBC: 0 % (ref 0.0–0.2)

## 2020-01-25 LAB — GLUCOSE, CAPILLARY
Glucose-Capillary: 97 mg/dL (ref 70–99)
Glucose-Capillary: 136 mg/dL — ABNORMAL HIGH (ref 70–99)
Glucose-Capillary: 106 mg/dL — ABNORMAL HIGH (ref 70–99)
Glucose-Capillary: 102 mg/dL — ABNORMAL HIGH (ref 70–99)

## 2020-01-25 LAB — MAGNESIUM
Magnesium: 2.2 mg/dL (ref 1.7–2.4)
Magnesium: 2 mg/dL (ref 1.7–2.4)

## 2020-01-25 LAB — PHOSPHORUS
Phosphorus: 2.1 mg/dL — ABNORMAL LOW (ref 2.5–4.6)
Phosphorus: 1.8 mg/dL — ABNORMAL LOW (ref 2.5–4.6)

## 2020-01-25 LAB — AMMONIA: Ammonia: 22 umol/L (ref 9–35)

## 2020-01-25 LAB — PROTIME-INR
INR: 1 (ref 0.8–1.2)
Prothrombin Time: 13.1 seconds (ref 11.4–15.2)

## 2020-01-25 MED ORDER — ADULT MULTIVITAMIN W/MINERALS CH
1.0000 | ORAL_TABLET | Freq: Every day | ORAL | Status: DC
  Administered 2020-01-25 – 2020-02-03 (×10): 1 via ORAL
  Filled 2020-01-25 (×10): qty 1

## 2020-01-25 MED ORDER — THIAMINE HCL 100 MG/ML IJ SOLN
100.0000 mg | Freq: Every day | INTRAMUSCULAR | Status: DC
  Filled 2020-01-25: qty 2

## 2020-01-25 MED ORDER — LORAZEPAM 2 MG/ML IJ SOLN: 1.0000 mg | INTRAMUSCULAR | Status: AC | PRN

## 2020-01-25 MED ORDER — FOLIC ACID 1 MG PO TABS
1.0000 mg | ORAL_TABLET | Freq: Every day | ORAL | Status: DC
  Administered 2020-01-25 – 2020-02-03 (×10): 1 mg via ORAL
  Filled 2020-01-25 (×10): qty 1

## 2020-01-25 MED ORDER — LORAZEPAM 1 MG PO TABS
1.0000 mg | ORAL_TABLET | ORAL | Status: AC | PRN
  Administered 2020-01-26: 1 mg via ORAL
  Administered 2020-01-27: 2 mg via ORAL
  Filled 2020-01-25: qty 2
  Filled 2020-01-25 (×2): qty 1

## 2020-01-25 MED ORDER — THIAMINE HCL 100 MG PO TABS
100.0000 mg | ORAL_TABLET | Freq: Every day | ORAL | Status: DC
  Administered 2020-01-25 – 2020-02-03 (×10): 100 mg via ORAL
  Filled 2020-01-25 (×10): qty 1

## 2020-01-25 MED ORDER — LACTATED RINGERS IV BOLUS
1000.0000 mL | Freq: Once | INTRAVENOUS | Status: AC
  Administered 2020-01-25: 1000 mL via INTRAVENOUS

## 2020-01-25 NOTE — Progress Notes (Addendum)
NAME:  Ashley Bradley, MRN:  [id number], DOB:  [birth year], LOS: 1 ADMISSION DATE:  01/24/2020, CONSULTATION DATE: January 24, 2020 REFERRING MD: Dr. Daun Peacock, CHIEF COMPLAINT: Weakness, seizure  Brief History   69 year old female presented to the [facility] long emergency room complaining of weakness, desire to stop drinking alcohol.  Had a seizure in the [facility] long emergency room and exhibited signs of alcohol withdrawal.  Pulmonary and critical care medicine consulted for admission.  History of present illness   This is a 69 year old female who says she came to the emergency room "because she got herself in trouble from drinking too much".  Her husband called EMS to bring her in because she was profoundly weak.  She says she has not had a regular meal in days.  She has been drinking "too much".  Her last drink of alcohol was yesterday.  In the emergency room she screamed out and had bilateral extremity convulsive type movements.  She was given Ativan and Keppra.  She was started on Precedex for presumed alcohol withdrawal.  Labs were notable for hyponatremia, and increased anion gap, and elevated LFTs.  Pulmonary and critical care medicine was consulted for admission.  Of note, the patient was confused and unable to provide a reliable history.  Past Medical History  Seizures, alcohol withdrawal related Hypertension Depression Alcohol abuse  Significant Hospital Events   July 23 admitted  Consults:    Procedures:    Significant Diagnostic Tests:  July 23 CT head no acute intracranial process  Micro Data:  July 23 SARS-CoV-2 negative  Antimicrobials:    Interim history/subjective:   No issues overnight.  Wants something to eat  Objective   Blood pressure (!) 113/58, pulse 103, temperature 98.4 F (36.9 C), temperature source Oral, resp. rate (!) 30, height 5\' 5"  (1.651 m), weight 73.5 kg, SpO2 93 %.        Intake/Output Summary (Last 24 hours) at 01/25/2020 0903 Last data filed  at 01/25/2020 0700 Gross per 24 hour  Intake 2544.4 ml  Output --  Net 2544.4 ml   Filed Weights   01/24/20 0141 01/24/20 1712  Weight: 72.6 kg 73.5 kg    Examination: Gen:      No acute distress, unkempt HEENT:  EOMI, sclera anicteric Neck:     No masses; no thyromegaly Lungs:    Clear to auscultation bilaterally; normal respiratory effort CV:         Regular rate and rhythm; no murmurs Abd:      + bowel sounds; soft, non-tender; no palpable masses, no distension Ext:    No edema; adequate peripheral perfusion Skin:      Warm and dry; no rash Neuro: alert and oriented x 3 Psych: normal mood and affect  Labs/imaging reviewed CBC is stable.  Metabolic panel pending No new imaging   Resolved Hospital Proble.m list     Assessment & Plan:  Delirium tremens with alcohol withdrawal seizure: Keppra twice daily Order EEG if seizure occurs again ICU alcohol withdrawal protocol CIWA protocol Stable for transfer to progressive  Acute metabolic encephalopathy secondary to alcohol withdrawal complicated by hepatic encephalopathy: Lactulose scheduled, titrate to 3 stools a day  Increased anion gap metabolic acidosis: Possibly alcohol ketosis considering poor p.o. intake Normal lactic acid Follow labs today  Sinus tachycardia LR fluid bolus  Hyponatremia: Possibly related to alcohol abuse Free water restrict Follow sodium  Alcoholic hepatitis: Follow LFTs Discriminant function 10. Does not need steroids  Best practice:  Diet:  Start p.o. diet Pain/Anxiety/Delirium protocol (if indicated): n/a VAP protocol (if indicated): n/a DVT prophylaxis: sub q heparin GI prophylaxis: n/a Glucose control: SSI Mobility: bed rest Code Status: full Family Communication: Patient and daughter updated. Disposition: Transfer out of ICU  Critical care time: NA   Chilton Greathouse MD [facility] Please see [email or web address] for pager details.  01/25/2020, 9:03  AM

## 2020-01-26 DIAGNOSIS — R7989 Other specified abnormal findings of blood chemistry: Secondary | ICD-10-CM

## 2020-01-26 DIAGNOSIS — E872 Acidosis: Secondary | ICD-10-CM

## 2020-01-26 DIAGNOSIS — F10231 Alcohol dependence with withdrawal delirium: Principal | ICD-10-CM

## 2020-01-26 DIAGNOSIS — R569 Unspecified convulsions: Secondary | ICD-10-CM

## 2020-01-26 LAB — COMPREHENSIVE METABOLIC PANEL
ALT: 63 U/L — ABNORMAL HIGH (ref 0–44)
AST: 67 U/L — ABNORMAL HIGH (ref 15–41)
Albumin: 3.7 g/dL (ref 3.5–5.0)
Alkaline Phosphatase: 111 U/L (ref 38–126)
Anion gap: 8 (ref 5–15)
BUN: 7 mg/dL — ABNORMAL LOW (ref 8–23)
CO2: 23 mmol/L (ref 22–32)
Calcium: 8.8 mg/dL — ABNORMAL LOW (ref 8.9–10.3)
Chloride: 105 mmol/L (ref 98–111)
Creatinine, Ser: 0.42 mg/dL — ABNORMAL LOW (ref 0.44–1.00)
GFR calc Af Amer: >60 mL/min (ref >60)
GFR calc non Af Amer: >60 mL/min (ref >60)
Glucose, Bld: 124 mg/dL — ABNORMAL HIGH (ref 70–99)
Potassium: 3.2 mmol/L — ABNORMAL LOW (ref 3.5–5.1)
Sodium: 136 mmol/L (ref 135–145)
Total Bilirubin: 1 mg/dL (ref 0.3–1.2)
Total Protein: 6.4 g/dL — ABNORMAL LOW (ref 6.5–8.1)

## 2020-01-26 LAB — CBC
HCT: 34.5 % — ABNORMAL LOW (ref 36.0–46.0)
Hemoglobin: 11.5 g/dL — ABNORMAL LOW (ref 12.0–15.0)
MCH: 34 pg (ref 26.0–34.0)
MCHC: 33.3 g/dL (ref 30.0–36.0)
MCV: 102.1 fL — ABNORMAL HIGH (ref 80.0–100.0)
Platelets: 142 10*3/uL — ABNORMAL LOW (ref 150–400)
RBC: 3.38 MIL/uL — ABNORMAL LOW (ref 3.87–5.11)
RDW: 14.5 % (ref 11.5–15.5)
WBC: 4.8 10*3/uL (ref 4.0–10.5)
nRBC: 0 % (ref 0.0–0.2)

## 2020-01-26 LAB — MAGNESIUM: Magnesium: 2 mg/dL (ref 1.7–2.4)

## 2020-01-26 LAB — GLUCOSE, CAPILLARY
Glucose-Capillary: 102 mg/dL — ABNORMAL HIGH (ref 70–99)
Glucose-Capillary: 104 mg/dL — ABNORMAL HIGH (ref 70–99)
Glucose-Capillary: 118 mg/dL — ABNORMAL HIGH (ref 70–99)
Glucose-Capillary: 131 mg/dL — ABNORMAL HIGH (ref 70–99)
Glucose-Capillary: 86 mg/dL (ref 70–99)
Glucose-Capillary: 88 mg/dL (ref 70–99)
Glucose-Capillary: 93 mg/dL (ref 70–99)

## 2020-01-26 LAB — PHOSPHORUS: Phosphorus: 2.5 mg/dL (ref 2.5–4.6)

## 2020-01-26 MED ORDER — TRAMADOL HCL 50 MG PO TABS
50.0000 mg | ORAL_TABLET | Freq: Four times a day (QID) | ORAL | Status: DC | PRN
  Administered 2020-01-26 – 2020-01-29 (×2): 50 mg via ORAL
  Filled 2020-01-26 (×2): qty 1

## 2020-01-26 MED ORDER — PANTOPRAZOLE SODIUM 40 MG PO TBEC
40.0000 mg | DELAYED_RELEASE_TABLET | Freq: Every day | ORAL | Status: DC
  Administered 2020-01-26 – 2020-02-03 (×9): 40 mg via ORAL
  Filled 2020-01-26 (×9): qty 1

## 2020-01-26 MED ORDER — FLUOXETINE HCL 20 MG PO CAPS
40.0000 mg | ORAL_CAPSULE | Freq: Every morning | ORAL | Status: DC
  Administered 2020-01-26 – 2020-02-03 (×9): 40 mg via ORAL
  Filled 2020-01-26 (×9): qty 2

## 2020-01-26 MED ORDER — POTASSIUM CHLORIDE 20 MEQ PO PACK
40.0000 meq | PACK | ORAL | Status: AC
  Administered 2020-01-26: 40 meq via ORAL
  Filled 2020-01-26: qty 2

## 2020-01-26 NOTE — Progress Notes (Signed)
[facility]  PROGRESS NOTE  Ashley Bradley EGB:[id number] DOB: 1950/11/07 DOA: 01/24/2020 PCP: Sigmund Hazel, MD Admit date - 01/24/2020   Admitting Physician Lupita Leash, MD  Outpatient Primary MD for the patient is Sigmund Hazel, MD  LOS - 2 Brief Narrative   Ashley Bradley is a 69 year old female with medical history significant for hypertension, prior MRSA bacteremia (2019), alcohol abuse, cerebral palsy with right-sided residual weakness who presented on 7/23 with a desire to stop drinking alcohol and had a witnessed convulsive type movements concerning for alcohol withdrawal seizure in the [facility] long emergency room.  Patient was given Ativan and Keppra and started on Precedex drip for presumed alcohol withdrawal with critical care management.  During hospital course she was found to have hyponatremia, increased anion gap metabolic acidosis and transaminitis presume related to alcohol abuse.  Patient also found to have ammonia level of 102 prompting lactulose therapy and improvement in mental status and resolution of ammonia 22.  Patient was able to transition off of Precedex within 24 hours and with resolution of delirium tremens transferred to progressive unit under [facility] care on 7/25    Subjective  Today has no complaints.  Admits to heavy alcohol drinking prior to admission.  States she has had seizures before but not since she has been a kid.  A & P   Metabolic encephalopathy, multifactorial etiology including alcohol withdrawal/seizure and hepatic encephalopathy, now resolved.  Patient is alert and oriented x4, has no active signs of alcohol withdrawal, no asterixis, ammonia is down trended from 100-22 -Discontinue lactulose, monitor bowel movements -Continue to monitor on CIWA protocol, as needed Ativan -Continue IV Keppra, transition to oral  Weakness seizure-like activity.  No recurrent episodes.  Occurred in the setting of alcohol withdrawal.  Reports history of cerebral  palsy and prior seizure occurring as a kid remotely. -Has been on IV Keppra, transition to oral -Seizure precautions  Alcohol abuse.  Patient would like to abstain.  Admits to using alcohol to deal with life stressors. -TOC consulted for substance abuse counseling, outpatient resources -Daily folic acid, thiamine, multivitamin -BMP, magnesium  Alcoholic hepatitis.  AST 170, ALT 92 on admission.  Now improved 67 AST, 63 ALT.  Has no abdominal pain. -Monitor CMP  Hypokalemia.  Magnesium within normal limits.  Laxatives alcohol abuse. -Replete, goal potassium 4 -monitor telemetry  Asymptomatic SVT.  6 beats reported on telemetry. -Monitor potassium, mag, replete as needed  Hyponatremia.  Likely related to decreased salt low related to alcohol abuse, now resolved  Cerebral palsy.  Has residual right-sided weakness though states it does not affect her ability to ambulate -PT to eval and treat  Hypertension, currently at goal -Continue to monitor, will hold home losartan  GERD, stable -Continue home PPI  Mood disorder, stable -Continue home Prozac   Family Communication  : None  Code Status : Full  Disposition Plan  :  Patient is from home. Anticipated d/c date:  1 to 2 days. Barriers to d/c or necessity for inpatient status:  Close monitoring of electrolytes, SVT, disposition stenting to be required needing PT evaluation Consults  : None  Procedures  : None  DVT Prophylaxis  : Heparin  Lab Results  Component Value Date   PLT 142 (L) 01/26/2020    Diet :  Diet Order            Diet heart healthy/carb modified Room service appropriate? Yes; Fluid consistency: Thin  Diet effective now  Inpatient Medications Scheduled Meds: . FLUoxetine  40 mg Oral q morning - 10a  . folic acid  1 mg Oral Daily  . heparin  5,000 Units Subcutaneous Q8H  . insulin aspart  0-9 Units Subcutaneous Q4H  . multivitamin with minerals  1 tablet Oral Daily  .  pantoprazole  40 mg Oral Daily  . thiamine  100 mg Oral Daily   Or  . thiamine  100 mg Intravenous Daily   Continuous Infusions: . levETIRAcetam 1,000 mg (01/26/20 0445)   PRN Meds:.docusate sodium, LORazepam, LORazepam **OR** LORazepam, polyethylene glycol  Antibiotics  :   Anti-infectives (From admission, onward)   None       Objective   Vitals:   01/26/20 0010 01/26/20 0500 01/26/20 0544 01/26/20 1146  BP: (!) 152/91  (!) 138/91 (!) 155/89  Pulse: 96  (!) 107 101  Resp: 19  19 20   Temp: 99.4 F (37.4 C)  99 F (37.2 C) 98.2 F (36.8 C)  TempSrc: Oral  Oral Oral  SpO2: 98%  98% 95%  Weight:  73.6 kg    Height:        SpO2: 95 % O2 Flow Rate (L/min): 1 L/min  Wt Readings from Last 3 Encounters:  01/26/20 73.6 kg  11/08/17 66.4 kg  10/25/17 69.2 kg     Intake/Output Summary (Last 24 hours) at 01/26/2020 1416 Last data filed at 01/26/2020 1220 Gross per 24 hour  Intake 2147.25 ml  Output 5650 ml  Net -3502.75 ml    Physical Exam:   Alert and oriented x4, normal affect Right lower extremity able to move but noticeably weaker than left (chronic per patient), follows commands appropriately, no asterixis Abdomen soft, nondistended, normal bowel sounds No peripheral edema Mildly tachycardic, regular rate and rhythm Bruise on lower lip   I have personally reviewed the following:   Data Reviewed:  CBC Recent Labs  Lab 01/24/20 0111 01/25/20 0230 01/25/20 0948 01/26/20 0633  WBC 8.5 4.5 4.7 4.8  HGB 14.4 10.9* 11.1* 11.5*  HCT 43.0 34.6* 34.2* 34.5*  PLT 207 142* 145* 142*  MCV 100.9* 106.1* 104.9* 102.1*  MCH 33.8 33.4 34.0 34.0  MCHC 33.5 31.5 32.5 33.3  RDW 14.3 14.7 14.6 14.5  LYMPHSABS 1.7  --   --   --   MONOABS 1.2*  --   --   --   EOSABS 0.0  --   --   --   BASOSABS 0.1  --   --   --     Chemistries  Recent Labs  Lab 01/24/20 0111 01/24/20 0510 01/24/20 1452 01/25/20 0230 01/25/20 0948 01/26/20 0633  NA 128*  --  135 135  136 136  K 5.3*  --  3.3* 3.5 3.6 3.2*  CL 86*  --  104 104 105 105  CO2 19*  --  23 21* 20* 23  GLUCOSE 133*  --  86 95 125* 124*  BUN 11  --  11 10 10  7*  CREATININE 0.53  --  0.46 0.46 0.43* 0.42*  CALCIUM 10.6*  --  8.0* 8.7* 8.5* 8.8*  MG  --  2.4  --  2.2 2.0 2.0  AST 170*  --  94* 100* 87* 67*  ALT 92*  --  63* 71* 68* 63*  ALKPHOS 139*  --  93 107 112 111  BILITOT 2.0*  --  1.2 0.8 1.0 1.0   ------------------------------------------------------------------------------------------------------------------ Recent Labs    01/24/20 1452  TRIG 34  Lab Results  Component Value Date   HGBA1C 4.9 01/24/2020   ------------------------------------------------------------------------------------------------------------------ No results for input(s): TSH, T4TOTAL, T3FREE, THYROIDAB in the last 72 hours.  Invalid input(s): FREET3 ------------------------------------------------------------------------------------------------------------------ No results for input(s): VITAMINB12, FOLATE, FERRITIN, TIBC, IRON, RETICCTPCT in the last 72 hours.  Coagulation profile Recent Labs  Lab 01/24/20 0626 01/25/20 0230  INR 1.1 1.0    No results for input(s): DDIMER in the last 72 hours.  Cardiac Enzymes No results for input(s): CKMB, TROPONINI, MYOGLOBIN in the last 168 hours.  Invalid input(s): CK ------------------------------------------------------------------------------------------------------------------ No results found for: BNP  Micro Results Recent Results (from the past 240 hour(s))  SARS Coronavirus 2 by RT PCR (hospital order, performed in [facility] lab) Nasopharyngeal Nasopharyngeal Swab     Status: None   Collection Time: 01/24/20  1:08 AM   Specimen: Nasopharyngeal Swab  Result Value Ref Range Status   SARS Coronavirus 2 NEGATIVE NEGATIVE Final    Comment: (NOTE) SARS-CoV-2 target nucleic acids are NOT DETECTED.  The SARS-CoV-2 RNA is generally  detectable in upper and lower respiratory specimens during the acute phase of infection. The lowest concentration of SARS-CoV-2 viral copies this assay can detect is 250 copies / mL. A negative result does not preclude SARS-CoV-2 infection and should not be used as the sole basis for treatment or other patient management decisions.  A negative result may occur with improper specimen collection / handling, submission of specimen other than nasopharyngeal swab, presence of viral mutation(s) within the areas targeted by this assay, and inadequate number of viral copies (<250 copies / mL). A negative result must be combined with clinical observations, patient history, and epidemiological information.  Fact Sheet for Patients:   BoilerBrush.com.cyhttps://www.fda.gov/media/136312/download  Fact Sheet for Healthcare Providers: https://pope.com/https://www.fda.gov/media/136313/download  This test is not yet approved or  cleared by the [place] FDA and has been authorized for detection and/or diagnosis of SARS-CoV-2 by FDA under an Emergency Use Authorization (EUA).  This EUA will remain in effect (meaning this test can be used) for the duration of the COVID-19 declaration under Section 564(b)(1) of the Act, 21 U.S.C. section 360bbb-3(b)(1), unless the authorization is terminated or revoked sooner.  Performed at [facility], [address] [place], [place] [address]   MRSA PCR Screening     Status: None   Collection Time: 01/24/20  5:01 PM   Specimen: Nasal Mucosa; Nasopharyngeal  Result Value Ref Range Status   MRSA by PCR NEGATIVE NEGATIVE Final    Comment:        The GeneXpert MRSA Assay (FDA approved for NASAL specimens only), is one component of a comprehensive MRSA colonization surveillance program. It is not intended to diagnose MRSA infection nor to guide or monitor treatment for MRSA infections. Performed at [facility], [address] [place], [place]  [id number]     Radiology Reports CT Head Wo Contrast  Result Date: 01/24/2020 CLINICAL DATA:  Syncope and possible seizure EXAM: CT HEAD WITHOUT CONTRAST TECHNIQUE: Contiguous axial images were obtained from the base of the skull through the vertex without intravenous contrast. COMPARISON:  10/13/2017 FINDINGS: Brain: There is no mass, hemorrhage or extra-axial collection. The size and configuration of the ventricles and extra-axial CSF spaces are normal. The brain parenchyma is normal, without acute or chronic infarction. Vascular: No abnormal hyperdensity of the major intracranial arteries or dural venous sinuses. No intracranial atherosclerosis. Skull: The visualized skull base, calvarium and extracranial soft tissues are normal. Sinuses/Orbits: No fluid levels  or advanced mucosal thickening of the visualized paranasal sinuses. No mastoid or middle ear effusion. The orbits are normal. IMPRESSION: Normal head CT. Electronically Signed   By: Deatra Robinson M.D.   On: 01/24/2020 02:27   US Abdomen Complete  Result Date: 01/24/2020 CLINICAL DATA:  Elevated liver function tests. Acute renal insufficiency. EXAM: ABDOMEN ULTRASOUND COMPLETE COMPARISON:  Renal ultrasound, dated October 13, 2017. FINDINGS: Gallbladder: No gallstones or wall thickening visualized (3.0 mm). No sonographic Murphy sign noted by sonographer. Common bile duct: Diameter: 3.9 mm Liver: No focal lesion identified. There is diffusely increased echogenicity of the liver parenchyma. Portal vein is patent on color Doppler imaging with normal direction of blood flow towards the liver. IVC: No abnormality visualized. Pancreas: Visualized portion unremarkable. Spleen: Size (4.2 cm) and appearance within normal limits. Right Kidney: Length: 10.2 cm. Echogenicity within normal limits. No mass or hydronephrosis visualized. Left Kidney: Length: 10.6 cm. Echogenicity within normal limits. No mass or hydronephrosis visualized. Abdominal aorta: No aneurysm  visualized. Other findings: No free fluid is identified. IMPRESSION: Fatty liver. Electronically Signed   By: Aram Candela M.D.   On: 01/24/2020 22:03   DG Chest Port 1 View  Result Date: 01/24/2020 CLINICAL DATA:  Possible seizure EXAM: PORTABLE CHEST 1 VIEW COMPARISON:  10/16/2017 FINDINGS: The heart size and mediastinal contours are within normal limits. Both lungs are clear. The visualized skeletal structures are unremarkable. IMPRESSION: No active disease. Electronically Signed   By: Deatra Robinson M.D.   On: 01/24/2020 02:26     Time Spent in minutes  30     Laverna Peace M.D on 01/26/2020 at 2:16 PM  To page go to [email or web address] - password [facility]

## 2020-01-26 NOTE — Progress Notes (Signed)
Pt with 6 beats SVT, this RN present in room during occurrence. Pt asymptomatic, resting in bed having conversation with this nurse. MD Nettey made aware. Will continue to monitor patient.

## 2020-01-27 ENCOUNTER — Encounter (HOSPITAL_COMMUNITY): Payer: Self-pay | Admitting: Pulmonary Disease

## 2020-01-27 LAB — MAGNESIUM: Magnesium: 1.9 mg/dL (ref 1.7–2.4)

## 2020-01-27 LAB — GLUCOSE, CAPILLARY
Glucose-Capillary: 87 mg/dL (ref 70–99)
Glucose-Capillary: 99 mg/dL (ref 70–99)
Glucose-Capillary: 97 mg/dL (ref 70–99)
Glucose-Capillary: 119 mg/dL — ABNORMAL HIGH (ref 70–99)
Glucose-Capillary: 150 mg/dL — ABNORMAL HIGH (ref 70–99)
Glucose-Capillary: 107 mg/dL — ABNORMAL HIGH (ref 70–99)

## 2020-01-27 LAB — BASIC METABOLIC PANEL
Anion gap: 11 (ref 5–15)
BUN: 9 mg/dL (ref 8–23)
CO2: 24 mmol/L (ref 22–32)
Calcium: 9.3 mg/dL (ref 8.9–10.3)
Chloride: 99 mmol/L (ref 98–111)
Creatinine, Ser: 0.35 mg/dL — ABNORMAL LOW (ref 0.44–1.00)
GFR calc Af Amer: >60 mL/min (ref >60)
GFR calc non Af Amer: >60 mL/min (ref >60)
Glucose, Bld: 93 mg/dL (ref 70–99)
Potassium: 3.5 mmol/L (ref 3.5–5.1)
Sodium: 134 mmol/L — ABNORMAL LOW (ref 135–145)

## 2020-01-27 LAB — CBC
HCT: 36.5 % (ref 36.0–46.0)
Hemoglobin: 12 g/dL (ref 12.0–15.0)
MCH: 33.6 pg (ref 26.0–34.0)
MCHC: 32.9 g/dL (ref 30.0–36.0)
MCV: 102.2 fL — ABNORMAL HIGH (ref 80.0–100.0)
Platelets: 168 10*3/uL (ref 150–400)
RBC: 3.57 MIL/uL — ABNORMAL LOW (ref 3.87–5.11)
RDW: 14 % (ref 11.5–15.5)
WBC: 4.9 10*3/uL (ref 4.0–10.5)
nRBC: 0 % (ref 0.0–0.2)

## 2020-01-27 MED ORDER — POTASSIUM CHLORIDE 20 MEQ PO PACK
40.0000 meq | PACK | Freq: Once | ORAL | Status: AC
  Administered 2020-01-27: 40 meq via ORAL
  Filled 2020-01-27: qty 2

## 2020-01-27 MED ORDER — LOSARTAN POTASSIUM 50 MG PO TABS
50.0000 mg | ORAL_TABLET | Freq: Every day | ORAL | Status: DC
  Administered 2020-01-27 – 2020-02-03 (×8): 50 mg via ORAL
  Filled 2020-01-27 (×8): qty 1

## 2020-01-27 MED ORDER — LEVETIRACETAM 500 MG PO TABS
1000.0000 mg | ORAL_TABLET | Freq: Two times a day (BID) | ORAL | Status: DC
  Administered 2020-01-27 – 2020-02-03 (×14): 1000 mg via ORAL
  Filled 2020-01-27 (×14): qty 2

## 2020-01-27 NOTE — Progress Notes (Signed)
[facility]  PROGRESS NOTE  Ashley Bradley BJS:[id number] DOB: [birth year] DOA: 01/24/2020 PCP: Sigmund Hazel, MD Admit date - 01/24/2020   Admitting Physician Lupita Leash, MD  Outpatient Primary MD for the patient is Sigmund Hazel, MD  LOS - 3 Brief Narrative   [name] is a 69 year old female with medical history significant for hypertension, prior MRSA bacteremia (2019), alcohol abuse, cerebral palsy with right-sided residual weakness who presented on 7/23 with a desire to stop drinking alcohol and had a witnessed convulsive type movements concerning for alcohol withdrawal seizure in the [facility] long emergency room.  Patient was given Ativan and Keppra and started on Precedex drip for presumed alcohol withdrawal with critical care management.  During hospital course she was found to have hyponatremia, increased anion gap metabolic acidosis and transaminitis presume related to alcohol abuse.  Patient also found to have ammonia level of 102 prompting lactulose therapy and improvement in mental status and resolution of ammonia 22.  Patient was able to transition off of Precedex within 24 hours and with resolution of delirium tremens transferred to progressive unit under [facility] care on 7/25    Subjective  Today states felt achy last night.  Had T-max of 100.  Has chronic cough with no changes.  No dysuria.  No abdominal pain.  Eating well.  A & P   Metabolic encephalopathy, multifactorial etiology including alcohol withdrawal/seizure and hepatic encephalopathy, now resolved.  Patient is alert and oriented x4, has no active signs of alcohol withdrawal, no asterixis, ammonia is down trended from 100-22 -Discontinued lactulose, monitor bowel movements -Continue to monitor on CIWA protocol, as needed Ativan -Oral Keppra  Weakness seizure-like activity.  No recurrent episodes.  Occurred in the setting of alcohol withdrawal.  Reports history of cerebral palsy and prior seizure occurring as a  kid remotely. -On Keppra -Seizure precautions  Alcohol abuse.  Patient would like to abstain.  Admits to using alcohol to deal with life stressors. -TOC consulted for substance abuse counseling, outpatient resources -Daily folic acid, thiamine, multivitamin -BMP, magnesium  Alcoholic hepatitis.  AST 170, ALT 92 on admission.  Now improved 67 AST, 63 ALT.  Has no abdominal pain. -Monitor CMP  Hypokalemia.  Magnesium within normal limits.  Laxatives alcohol abuse. -Replete, goal potassium 4 -monitor telemetry  Asymptomatic SVT.  6 beats reported on telemetry on 7/25 -Monitor potassium (goal of 4), mag (goal of 2), replete as needed  Hyponatremia.  Likely related to decreased salt low related to alcohol abuse, now resolved  Cerebral palsy.  Has residual right-sided weakness though states it does not affect her ability to ambulate -PT to eval and treat--recommending SNF Profound weakness and decreased ability to mobilize, patient elects for 24-hour assist with husband home health PT  Hypertension, elevated to 180s -Resume home losartan  GERD, stable -Continue home PPI  Mood disorder, stable -Continue home Prozac   Family Communication  : None  Code Status : Full  Disposition Plan  :  Patient is from home. Anticipated d/c date:  1 to 2 days. Barriers to d/c or necessity for inpatient status:  Close monitoring of electrolytes, monitor fever curve, blood cultures afebrile, monitor for infusion of acceptable  .  Anticipate discharge home with home physical therapy as patient is declining SNF Consults  : None  Procedures  : None  DVT Prophylaxis  : Heparin  Lab Results  Component Value Date   PLT 168 01/27/2020    Diet :  Diet Order  Diet heart healthy/carb modified Room service appropriate? Yes; Fluid consistency: Thin  Diet effective now                  Inpatient Medications Scheduled Meds: . FLUoxetine  40 mg Oral q morning - 10a  . folic acid  1  mg Oral Daily  . heparin  5,000 Units Subcutaneous Q8H  . insulin aspart  0-9 Units Subcutaneous Q4H  . levETIRAcetam  1,000 mg Oral BID  . losartan  50 mg Oral Daily  . multivitamin with minerals  1 tablet Oral Daily  . pantoprazole  40 mg Oral Daily  . thiamine  100 mg Oral Daily   Or  . thiamine  100 mg Intravenous Daily   Continuous Infusions:  PRN Meds:.docusate sodium, LORazepam, LORazepam **OR** LORazepam, polyethylene glycol, traMADol  Antibiotics  :   Anti-infectives (From admission, onward)   None       Objective   Vitals:   01/27/20 0605 01/27/20 0848 01/27/20 1017 01/27/20 1217  BP: (!) 143/86 (!) 146/86 (!) 146/86 (!) 135/87  Pulse: 92 91  89  Resp: 18 18  18   Temp: 99.3 F (37.4 C) 98.4 F (36.9 C)  99.5 F (37.5 C)  TempSrc: Oral Oral  Oral  SpO2: 94% 96%  93%  Weight:      Height:        SpO2: 93 % O2 Flow Rate (L/min): 1 L/min  Wt Readings from Last 3 Encounters:  01/26/20 73.6 kg  11/08/17 66.4 kg  10/25/17 69.2 kg     Intake/Output Summary (Last 24 hours) at 01/27/2020 1728 Last data filed at 01/27/2020 1638 Gross per 24 hour  Intake 680 ml  Output 3100 ml  Net -2420 ml    Physical Exam:   Alert and oriented x4, normal affect Right lower extremity able to move but noticeably weaker than left (chronic per patient), follows commands appropriately, no asterixis Abdomen soft, nondistended, normal bowel sounds No peripheral edema regular rate and rhythm Bruise on lower lip   I have personally reviewed the following:   Data Reviewed:  CBC Recent Labs  Lab 01/24/20 0111 01/25/20 0230 01/25/20 0948 01/26/20 0633 01/27/20 0622  WBC 8.5 4.5 4.7 4.8 4.9  HGB 14.4 10.9* 11.1* 11.5* 12.0  HCT 43.0 34.6* 34.2* 34.5* 36.5  PLT 207 142* 145* 142* 168  MCV 100.9* 106.1* 104.9* 102.1* 102.2*  MCH 33.8 33.4 34.0 34.0 33.6  MCHC 33.5 31.5 32.5 33.3 32.9  RDW 14.3 14.7 14.6 14.5 14.0  LYMPHSABS 1.7  --   --   --   --   MONOABS 1.2*   --   --   --   --   EOSABS 0.0  --   --   --   --   BASOSABS 0.1  --   --   --   --     Chemistries  Recent Labs  Lab 01/24/20 0111 01/24/20 0111 01/24/20 0510 01/24/20 1452 01/25/20 0230 01/25/20 0948 01/26/20 0633 01/27/20 0622  NA 128*   < >  --  135 135 136 136 134*  K 5.3*   < >  --  3.3* 3.5 3.6 3.2* 3.5  CL 86*   < >  --  104 104 105 105 99  CO2 19*   < >  --  23 21* 20* 23 24  GLUCOSE 133*   < >  --  86 95 125* 124* 93  BUN 11   < >  --  11 10 10  7* 9  CREATININE 0.53   < >  --  0.46 0.46 0.43* 0.42* 0.35*  CALCIUM 10.6*   < >  --  8.0* 8.7* 8.5* 8.8* 9.3  MG  --   --  2.4  --  2.2 2.0 2.0 1.9  AST 170*  --   --  94* 100* 87* 67*  --   ALT 92*  --   --  63* 71* 68* 63*  --   ALKPHOS 139*  --   --  93 107 112 111  --   BILITOT 2.0*  --   --  1.2 0.8 1.0 1.0  --    < > = values in this interval not displayed.   ------------------------------------------------------------------------------------------------------------------ No results for input(s): CHOL, HDL, LDLCALC, TRIG, CHOLHDL, LDLDIRECT in the last 72 hours.  Lab Results  Component Value Date   HGBA1C 4.9 01/24/2020   ------------------------------------------------------------------------------------------------------------------ No results for input(s): TSH, T4TOTAL, T3FREE, THYROIDAB in the last 72 hours.  Invalid input(s): FREET3 ------------------------------------------------------------------------------------------------------------------ No results for input(s): VITAMINB12, FOLATE, FERRITIN, TIBC, IRON, RETICCTPCT in the last 72 hours.  Coagulation profile Recent Labs  Lab 01/24/20 0626 01/25/20 0230  INR 1.1 1.0    No results for input(s): DDIMER in the last 72 hours.  Cardiac Enzymes No results for input(s): CKMB, TROPONINI, MYOGLOBIN in the last 168 hours.  Invalid input(s):  CK ------------------------------------------------------------------------------------------------------------------ No results found for: BNP  Micro Results Recent Results (from the past 240 hour(s))  SARS Coronavirus 2 by RT PCR (hospital order, performed [facility]) Nasopharyngeal Nasopharyngeal Swab     Status: None   Collection Time: 01/24/20  1:08 AM   Specimen: Nasopharyngeal Swab  Result Value Ref Range Status   SARS Coronavirus 2 NEGATIVE NEGATIVE Final    Comment: (NOTE) SARS-CoV-2 target nucleic acids are NOT DETECTED.  The SARS-CoV-2 RNA is generally detectable in upper and lower respiratory specimens during the acute phase of infection. The lowest concentration of SARS-CoV-2 viral copies this assay can detect is 250 copies / mL. A negative result does not preclude SARS-CoV-2 infection and should not be used as the sole basis for treatment or other patient management decisions.  A negative result may occur with improper specimen collection / handling, submission of specimen other than nasopharyngeal swab, presence of viral mutation(s) within the areas targeted by this assay, and inadequate number of viral copies (<250 copies / mL). A negative result must be combined with clinical observations, patient history, and epidemiological information.  Fact Sheet for Patients:   BoilerBrush.com.cyhttps://www.fda.gov/media/136312/download  Fact Sheet for Healthcare Providers: https://pope.com/https://www.fda.gov/media/136313/download  This test is not yet approved or  cleared by the [place] FDA and has been authorized for detection and/or diagnosis of SARS-CoV-2 by FDA under an Emergency Use Authorization (EUA).  This EUA will remain in effect (meaning this test can be used) for the duration of the COVID-19 declaration under Section 564(b)(1) of the Act, 21 U.S.C. section 360bbb-3(b)(1), unless the authorization is terminated or revoked sooner.  Performed at [facility], [address] [place], [place] [address]   MRSA PCR Screening     Status: None   Collection Time: 01/24/20  5:01 PM   Specimen: Nasal Mucosa; Nasopharyngeal  Result Value Ref Range Status   MRSA by PCR NEGATIVE NEGATIVE Final    Comment:        The GeneXpert MRSA Assay (FDA approved for NASAL specimens only), is one component of a comprehensive MRSA  colonization surveillance program. It is not intended to diagnose MRSA infection nor to guide or monitor treatment for MRSA infections. Performed at [facility], [address] [place], [place] [address]     Radiology Reports CT Head Wo Contrast  Result Date: 01/24/2020 CLINICAL DATA:  Syncope and possible seizure EXAM: CT HEAD WITHOUT CONTRAST TECHNIQUE: Contiguous axial images were obtained from the base of the skull through the vertex without intravenous contrast. COMPARISON:  10/13/2017 FINDINGS: Brain: There is no mass, hemorrhage or extra-axial collection. The size and configuration of the ventricles and extra-axial CSF spaces are normal. The brain parenchyma is normal, without acute or chronic infarction. Vascular: No abnormal hyperdensity of the major intracranial arteries or dural venous sinuses. No intracranial atherosclerosis. Skull: The visualized skull base, calvarium and extracranial soft tissues are normal. Sinuses/Orbits: No fluid levels or advanced mucosal thickening of the visualized paranasal sinuses. No mastoid or middle ear effusion. The orbits are normal. IMPRESSION: Normal head CT. Electronically Signed   By: Deatra Robinson M.D.   On: 01/24/2020 02:27   US Abdomen Complete  Result Date: 01/24/2020 CLINICAL DATA:  Elevated liver function tests. Acute renal insufficiency. EXAM: ABDOMEN ULTRASOUND COMPLETE COMPARISON:  Renal ultrasound, dated October 13, 2017. FINDINGS: Gallbladder: No gallstones or wall thickening visualized (3.0 mm). No sonographic Murphy sign noted by sonographer. Common  bile duct: Diameter: 3.9 mm Liver: No focal lesion identified. There is diffusely increased echogenicity of the liver parenchyma. Portal vein is patent on color Doppler imaging with normal direction of blood flow towards the liver. IVC: No abnormality visualized. Pancreas: Visualized portion unremarkable. Spleen: Size (4.2 cm) and appearance within normal limits. Right Kidney: Length: 10.2 cm. Echogenicity within normal limits. No mass or hydronephrosis visualized. Left Kidney: Length: 10.6 cm. Echogenicity within normal limits. No mass or hydronephrosis visualized. Abdominal aorta: No aneurysm visualized. Other findings: No free fluid is identified. IMPRESSION: Fatty liver. Electronically Signed   By: Aram Candela M.D.   On: 01/24/2020 22:03   DG Chest Port 1 View  Result Date: 01/24/2020 CLINICAL DATA:  Possible seizure EXAM: PORTABLE CHEST 1 VIEW COMPARISON:  10/16/2017 FINDINGS: The heart size and mediastinal contours are within normal limits. Both lungs are clear. The visualized skeletal structures are unremarkable. IMPRESSION: No active disease. Electronically Signed   By: Deatra Robinson M.D.   On: 01/24/2020 02:26     Time Spent in minutes  30     Laverna Peace M.D on 01/27/2020 at 5:28 PM  To page go to [email or web address] - password [facility]

## 2020-01-27 NOTE — Evaluation (Addendum)
Physical Therapy Evaluation Patient Details Name: Ashley Bradley MRN: [id number] DOB: 06-16-1951 Today's Date: 01/27/2020   History of Present Illness  69 year old female with medical history significant for hypertension, prior MRSA bacteremia (2019), alcohol abuse, cerebral palsy with right-sided residual weakness who presented on 7/23 with a desire to stop drinking alcohol and had a witnessed convulsive type movements concerning for alcohol withdrawal seizure in the [facility] long emergency room.  Patient was given Ativan and Keppra and started on Precedex drip for presumed alcohol withdrawal with critical care management.  Clinical Impression  Pt admitted with above diagnosis. Pt with generalized weakness, R>L due to Cerebal Palsy. Pt PLOF independent with mobility and household chores, currently unable to tolerate standing or transfers due to elevated BP and HR. Pt tolerates sitting 3 min EOB, but increased symptoms so returned to supine. Max HR of 137 noted during supine to sit transfer, but returns to 110-120 while seated EOB and once returning to supine. RN notified of BP and HR. Pt adamant her spouse can care for her, reports he is getting a WC today for her, but no plan for 2 steps to enter home. Pt currently requires 24 hr assist due to weakness, spouse not present during evaluation. Pt currently with functional limitations due to the deficits listed below (see PT Problem List). Pt will benefit from skilled PT to increase their independence and safety with mobility to allow discharge to the venue listed below.        Follow Up Recommendations SNF;Supervision/Assistance - 24 hour (pending spouse's ability to care for pt)    Equipment Recommendations  None recommended by PT    Recommendations for Other Services       Precautions / Restrictions Precautions Precautions: Fall Precaution Comments: monitor BP Restrictions Weight Bearing Restrictions: No      Mobility  Bed  Mobility Overal bed mobility: Needs Assistance Bed Mobility: Supine to Sit;Sit to Supine  Supine to sit: Min assist;HOB elevated Sit to supine: Min guard;HOB elevated  General bed mobility comments: use of bedrail and elevated HOB with min A to upright trunk and scoot to EOB; min guard to return to supine, no physical assist to elevate LE back into bed  Transfers  General transfer comment: unable 2* dizziness, headache with sitting  Ambulation/Gait  General Gait Details: unable 2* dizziness, headache with sitting  Stairs            Wheelchair Mobility    Modified Rankin (Stroke Patients Only)       Balance Overall balance assessment: Needs assistance Sitting-balance support: Feet supported;Bilateral upper extremity supported Sitting balance-Leahy Scale: Fair Sitting balance - Comments: seated EOB  Standing balance comment: unable 2* dizziness, headache with sitting        Pertinent Vitals/Pain Pain Assessment: 0-10 Pain Score: 3  Pain Location: neck and bil ankles Pain Descriptors / Indicators: Sore;Spasm Pain Intervention(s): Limited activity within patient's tolerance;Monitored during session;Repositioned    Home Living Family/patient expects to be discharged to:: Private residence Living Arrangements: Spouse/significant other Available Help at Discharge: Family;Available PRN/intermittently Type of Home: House Home Access: Stairs to enter Entrance Stairs-Rails: Right Entrance Stairs-Number of Steps: 2 Home Layout: Two level;Able to live on main level with bedroom/bathroom Home Equipment: Dan Humphreys - 2 wheels;Walker - 4 wheels;Shower seat - built in;Grab bars - toilet;Grab bars - tub/shower;Wheelchair - manual Additional Comments: Pt reports spouse is getting her a lightweight WC today    Prior Function Level of Independence: Independent with assistive device(s)  Comments: Pt reports Ind with bathing, dressing, household chores, uses RW inside the  home and community when feels weak, and Ind with driving. Pt reports walking dog daily. Pt denies recent falls.     Hand Dominance   Dominant Hand: Left    Extremity/Trunk Assessment   Upper Extremity Assessment Upper Extremity Assessment: Generalized weakness    Lower Extremity Assessment Lower Extremity Assessment: Generalized weakness;RLE deficits/detail;LLE deficits/detail RLE Deficits / Details: AROM ~50%, hip/knee strength 2+/5, ankle dorsiflexion 3+/5, denies numbness/tingling throughout BLE LLE Deficits / Details: AROM ~50%, hip/knee/ankle strength 3+/5, denies numbness/tingling throughout LE    Cervical / Trunk Assessment Cervical / Trunk Assessment: Normal  Communication   Communication: No difficulties  Cognition Arousal/Alertness: Awake/alert Behavior During Therapy: WFL for tasks assessed/performed Overall Cognitive Status: Within Functional Limits for tasks assessed         General Comments General comments (skin integrity, edema, etc.): Pt seated ~3 min with increased headache and dizziness that doesn't resolve; vitals BP: 162/173mmHg, HR: 110-120 bpm with 137 max noted, SpO2 97% on RA    Exercises     Assessment/Plan    PT Assessment Patient needs continued PT services  PT Problem List Decreased strength;Decreased range of motion;Decreased activity tolerance;Decreased balance;Decreased mobility;Pain       PT Treatment Interventions DME instruction;Gait training;Functional mobility training;Therapeutic activities;Therapeutic exercise;Balance training;Neuromuscular re-education;Patient/family education    PT Goals (Current goals can be found in the Care Plan section)  Acute Rehab PT Goals Patient Stated Goal: "go back home with my husband" PT Goal Formulation: With patient Time For Goal Achievement: 02/03/20 Potential to Achieve Goals: Good    Frequency Min 3X/week   Barriers to discharge        Co-evaluation               AM-PAC PT "6  Clicks" Mobility  Outcome Measure Help needed turning from your back to your side while in a flat bed without using bedrails?: A Little Help needed moving from lying on your back to sitting on the side of a flat bed without using bedrails?: A Lot Help needed moving to and from a bed to a chair (including a wheelchair)?: Total Help needed standing up from a chair using your arms (e.g., wheelchair or bedside chair)?: Total Help needed to walk in hospital room?: Total Help needed climbing 3-5 steps with a railing? : Total 6 Click Score: 9    End of Session   Activity Tolerance: Patient tolerated treatment well;Other (comment) (Pt limited by headache, dizziness) Patient left: in bed;with call bell/phone within reach;with bed alarm set Nurse Communication: Mobility status;Other (comment) (BP and HR) PT Visit Diagnosis: Other abnormalities of gait and mobility (R26.89);Muscle weakness (generalized) (M62.81);Difficulty in walking, not elsewhere classified (R26.2)    Time: 2233-6122 PT Time Calculation (min) (ACUTE ONLY): 24 min   Charges:   PT Evaluation $PT Eval Moderate Complexity: 1 Mod          Tori Jayln Madeira PT, DPT 01/27/20, 2:11 PM

## 2020-01-28 LAB — COMPREHENSIVE METABOLIC PANEL
ALT: 50 U/L — ABNORMAL HIGH (ref 0–44)
AST: 34 U/L (ref 15–41)
Albumin: 3.7 g/dL (ref 3.5–5.0)
Alkaline Phosphatase: 126 U/L (ref 38–126)
Anion gap: 11 (ref 5–15)
BUN: 9 mg/dL (ref 8–23)
CO2: 24 mmol/L (ref 22–32)
Calcium: 9.5 mg/dL (ref 8.9–10.3)
Chloride: 98 mmol/L (ref 98–111)
Creatinine, Ser: 0.44 mg/dL (ref 0.44–1.00)
GFR calc Af Amer: >60 mL/min (ref >60)
GFR calc non Af Amer: >60 mL/min (ref >60)
Glucose, Bld: 95 mg/dL (ref 70–99)
Potassium: 3.9 mmol/L (ref 3.5–5.1)
Sodium: 133 mmol/L — ABNORMAL LOW (ref 135–145)
Total Bilirubin: 0.6 mg/dL (ref 0.3–1.2)
Total Protein: 6.8 g/dL (ref 6.5–8.1)

## 2020-01-28 LAB — CBC
HCT: 38 % (ref 36.0–46.0)
Hemoglobin: 12.2 g/dL (ref 12.0–15.0)
MCH: 33.1 pg (ref 26.0–34.0)
MCHC: 32.1 g/dL (ref 30.0–36.0)
MCV: 103 fL — ABNORMAL HIGH (ref 80.0–100.0)
Platelets: 220 10*3/uL (ref 150–400)
RBC: 3.69 MIL/uL — ABNORMAL LOW (ref 3.87–5.11)
RDW: 14 % (ref 11.5–15.5)
WBC: 5.1 10*3/uL (ref 4.0–10.5)
nRBC: 0 % (ref 0.0–0.2)

## 2020-01-28 LAB — GLUCOSE, CAPILLARY
Glucose-Capillary: 100 mg/dL — ABNORMAL HIGH (ref 70–99)
Glucose-Capillary: 89 mg/dL (ref 70–99)
Glucose-Capillary: 104 mg/dL — ABNORMAL HIGH (ref 70–99)
Glucose-Capillary: 120 mg/dL — ABNORMAL HIGH (ref 70–99)
Glucose-Capillary: 114 mg/dL — ABNORMAL HIGH (ref 70–99)

## 2020-01-28 NOTE — Progress Notes (Signed)
[facility]  PROGRESS NOTE  [name]:[id number] DOB: 1950-07-29 DOA: 01/24/2020 PCP: Sigmund Hazel, MD Admit date - 01/24/2020   Admitting Physician Lupita Leash, MD  Outpatient Primary MD for the patient is Sigmund Hazel, MD  LOS - 4 Brief Narrative   [name] is a 69 year old female with medical history significant for hypertension, prior MRSA bacteremia (2019), alcohol abuse, cerebral palsy with right-sided residual weakness who presented on 7/23 with a desire to stop drinking alcohol and had a witnessed convulsive type movements concerning for alcohol withdrawal seizure in the [facility] long emergency room.  Patient was given Ativan and Keppra and started on Precedex drip for presumed alcohol withdrawal with critical care management.  During hospital course she was found to have hyponatremia, increased anion gap metabolic acidosis and transaminitis presume related to alcohol abuse.  Patient also found to have ammonia level of 102 prompting lactulose therapy and improvement in mental status and resolution of ammonia 22.  Patient was able to transition off of Precedex within 24 hours and with resolution of delirium tremens transferred to progressive unit under [facility] care on 7/25    Subjective  Today states she didn't work well with PT and would like to try again  A & P   Metabolic encephalopathy, multifactorial etiology including alcohol withdrawal/seizure and hepatic encephalopathy, now resolved.  Patient is alert and oriented x4, has no active signs of alcohol withdrawal, no asterixis, ammonia is down trended from 100-22 -Discontinued lactulose, monitor bowel movements -Continue to monitor on CIWA protocol, as needed Ativan -Oral Keppra  Witnessed seizure-like activity.  No recurrent episodes.  Occurred in the setting of alcohol withdrawal.  Reports history of cerebral palsy and prior seizure occurring as a kid remotely. -On Keppra -Seizure precautions  Alcohol abuse.   Patient would like to abstain.  Admits to using alcohol to deal with life stressors. -TOC consulted for substance abuse counseling, outpatient resources, already familiar with AA program -Daily folic acid, thiamine, multivitamin -BMP, magnesium  Alcoholic hepatitis.  AST 170, ALT 92 on admission.  Now improved.  Has no abdominal pain. -Monitor CMP  Hypokalemia.  Magnesium within normal limits.  Laxatives alcohol abuse. -Replete, goal potassium 4 -monitor telemetry  Asymptomatic SVT.  6 beats reported on telemetry on 7/25 -Monitor potassium (goal of 4), mag (goal of 2), replete as needed  Hyponatremia.  Likely related to decreased salt low related to alcohol abuse, now resolved  Cerebral palsy.  Has residual right-sided weakness though states it does not affect her ability to ambulate -PT to eval and treat--recommending SNF Profound weakness and decreased ability to mobilize, patient elects for 24-hour assist with husband home health PT  Hypertension, elevated to 180s -Resume home losartan  GERD, stable -Continue home PPI  Mood disorder, stable -Continue home Prozac   Family Communication  : Husband updated at bedside on 7/26  Code Status : Full  Disposition Plan  :  Patient is from home. Anticipated d/c date:  1 to 2 days. Barriers to d/c or necessity for inpatient status:  Close monitoring of electrolytes, monitor fever curve, blood cultures afebrile,.  Anticipate discharge home with home physical therapy as patient is declining SNF, but she would like to work with PT again Consults  : None  Procedures  : None  DVT Prophylaxis  : Heparin  Lab Results  Component Value Date   PLT 220 01/28/2020    Diet :  Diet Order  Diet heart healthy/carb modified Room service appropriate? Yes; Fluid consistency: Thin  Diet effective now                  Inpatient Medications Scheduled Meds: . FLUoxetine  40 mg Oral q morning - 10a  . folic acid  1 mg Oral Daily   . heparin  5,000 Units Subcutaneous Q8H  . insulin aspart  0-9 Units Subcutaneous Q4H  . levETIRAcetam  1,000 mg Oral BID  . losartan  50 mg Oral Daily  . multivitamin with minerals  1 tablet Oral Daily  . pantoprazole  40 mg Oral Daily  . thiamine  100 mg Oral Daily   Or  . thiamine  100 mg Intravenous Daily   Continuous Infusions:  PRN Meds:.docusate sodium, LORazepam, polyethylene glycol, traMADol  Antibiotics  :   Anti-infectives (From admission, onward)   None       Objective   Vitals:   01/28/20 0500 01/28/20 1040 01/28/20 1301 01/28/20 2035  BP:  (!) 144/83 (!) 136/83 (!) 144/80  Pulse:  98 96 87  Resp:   20 18  Temp:  98.4 F (36.9 C) 99.7 F (37.6 C) 98.2 F (36.8 C)  TempSrc:  Oral Oral Oral  SpO2:  97% 97% 97%  Weight: 70.5 kg     Height:        SpO2: 97 % O2 Flow Rate (L/min): 1 L/min  Wt Readings from Last 3 Encounters:  01/28/20 70.5 kg  11/08/17 66.4 kg  10/25/17 69.2 kg     Intake/Output Summary (Last 24 hours) at 01/28/2020 2111 Last data filed at 01/28/2020 1927 Gross per 24 hour  Intake 720 ml  Output 3601 ml  Net -2881 ml    Physical Exam:   Alert and oriented x4, normal affect Right lower extremity able to move but noticeably weaker than left (chronic per patient), follows commands appropriately, no asterixis Abdomen soft, nondistended, normal bowel sounds No peripheral edema regular rate and rhythm Bruise on lower lip   I have personally reviewed the following:   Data Reviewed:  CBC Recent Labs  Lab 01/24/20 0111 01/24/20 0111 01/25/20 0230 01/25/20 0948 01/26/20 0633 01/27/20 0622 01/28/20 0522  WBC 8.5   < > 4.5 4.7 4.8 4.9 5.1  HGB 14.4   < > 10.9* 11.1* 11.5* 12.0 12.2  HCT 43.0   < > 34.6* 34.2* 34.5* 36.5 38.0  PLT 207   < > 142* 145* 142* 168 220  MCV 100.9*   < > 106.1* 104.9* 102.1* 102.2* 103.0*  MCH 33.8   < > 33.4 34.0 34.0 33.6 33.1  MCHC 33.5   < > 31.5 32.5 33.3 32.9 32.1  RDW 14.3   < > 14.7  14.6 14.5 14.0 14.0  LYMPHSABS 1.7  --   --   --   --   --   --   MONOABS 1.2*  --   --   --   --   --   --   EOSABS 0.0  --   --   --   --   --   --   BASOSABS 0.1  --   --   --   --   --   --    < > = values in this interval not displayed.    Chemistries  Recent Labs  Lab 01/24/20 0111 01/24/20 0510 01/24/20 1452 01/24/20 1452 01/25/20 0230 01/25/20 0948 01/26/20 0633 01/27/20 0622 01/28/20 0522  NA   < >  --  135   < > 135 136 136 134* 133*  K   < >  --  3.3*   < > 3.5 3.6 3.2* 3.5 3.9  CL   < >  --  104   < > 104 105 105 99 98  CO2   < >  --  23   < > 21* 20* 23 24 24   GLUCOSE   < >  --  86   < > 95 125* 124* 93 95  BUN   < >  --  11   < > 10 10 7* 9 9  CREATININE   < >  --  0.46   < > 0.46 0.43* 0.42* 0.35* 0.44  CALCIUM   < >  --  8.0*   < > 8.7* 8.5* 8.8* 9.3 9.5  MG  --  2.4  --   --  2.2 2.0 2.0 1.9  --   AST   < >  --  94*  --  100* 87* 67*  --  34  ALT   < >  --  63*  --  71* 68* 63*  --  50*  ALKPHOS   < >  --  93  --  107 112 111  --  126  BILITOT   < >  --  1.2  --  0.8 1.0 1.0  --  0.6   < > = values in this interval not displayed.   ------------------------------------------------------------------------------------------------------------------ No results for input(s): CHOL, HDL, LDLCALC, TRIG, CHOLHDL, LDLDIRECT in the last 72 hours.  Lab Results  Component Value Date   HGBA1C 4.9 01/24/2020   ------------------------------------------------------------------------------------------------------------------ No results for input(s): TSH, T4TOTAL, T3FREE, THYROIDAB in the last 72 hours.  Invalid input(s): FREET3 ------------------------------------------------------------------------------------------------------------------ No results for input(s): VITAMINB12, FOLATE, FERRITIN, TIBC, IRON, RETICCTPCT in the last 72 hours.  Coagulation profile Recent Labs  Lab 01/24/20 0626 01/25/20 0230  INR 1.1 1.0    No results for input(s): DDIMER in the  last 72 hours.  Cardiac Enzymes No results for input(s): CKMB, TROPONINI, MYOGLOBIN in the last 168 hours.  Invalid input(s): CK ------------------------------------------------------------------------------------------------------------------ No results found for: BNP  Micro Results Recent Results (from the past 240 hour(s))  SARS Coronavirus 2 by RT PCR (hospital order, performed in [facility] hospital lab) Nasopharyngeal Nasopharyngeal Swab     Status: None   Collection Time: 01/24/20  1:08 AM   Specimen: Nasopharyngeal Swab  Result Value Ref Range Status   SARS Coronavirus 2 NEGATIVE NEGATIVE Final    Comment: (NOTE) SARS-CoV-2 target nucleic acids are NOT DETECTED.  The SARS-CoV-2 RNA is generally detectable in upper and lower respiratory specimens during the acute phase of infection. The lowest concentration of SARS-CoV-2 viral copies this assay can detect is 250 copies / mL. A negative result does not preclude SARS-CoV-2 infection and should not be used as the sole basis for treatment or other patient management decisions.  A negative result may occur with improper specimen collection / handling, submission of specimen other than nasopharyngeal swab, presence of viral mutation(s) within the areas targeted by this assay, and inadequate number of viral copies (<250 copies / mL). A negative result must be combined with clinical observations, patient history, and epidemiological information.  Fact Sheet for Patients:   01/26/20  Fact Sheet for Healthcare Providers: BoilerBrush.com.cy  This test is not yet approved or  cleared by the https://pope.com/ FDA and has been authorized for detection and/or diagnosis of SARS-CoV-2 by  FDA under an Emergency Use Authorization (EUA).  This EUA will remain in effect (meaning this test can be used) for the duration of the COVID-19 declaration under Section 564(b)(1) of the Act, 21  U.S.C. section 360bbb-3(b)(1), unless the authorization is terminated or revoked sooner.  Performed at [facility], [address] [place], [place] [address]   MRSA PCR Screening     Status: None   Collection Time: 01/24/20  5:01 PM   Specimen: Nasal Mucosa; Nasopharyngeal  Result Value Ref Range Status   MRSA by PCR NEGATIVE NEGATIVE Final    Comment:        The GeneXpert MRSA Assay (FDA approved for NASAL specimens only), is one component of a comprehensive MRSA colonization surveillance program. It is not intended to diagnose MRSA infection nor to guide or monitor treatment for MRSA infections. Performed at [facility], [address] [place], [place] [address]     Radiology Reports CT Head Wo Contrast  Result Date: 01/24/2020 CLINICAL DATA:  Syncope and possible seizure EXAM: CT HEAD WITHOUT CONTRAST TECHNIQUE: Contiguous axial images were obtained from the base of the skull through the vertex without intravenous contrast. COMPARISON:  10/13/2017 FINDINGS: Brain: There is no mass, hemorrhage or extra-axial collection. The size and configuration of the ventricles and extra-axial CSF spaces are normal. The brain parenchyma is normal, without acute or chronic infarction. Vascular: No abnormal hyperdensity of the major intracranial arteries or dural venous sinuses. No intracranial atherosclerosis. Skull: The visualized skull base, calvarium and extracranial soft tissues are normal. Sinuses/Orbits: No fluid levels or advanced mucosal thickening of the visualized paranasal sinuses. No mastoid or middle ear effusion. The orbits are normal. IMPRESSION: Normal head CT. Electronically Signed   By: Deatra RobinsonKevin  Herman M.D.   On: 01/24/2020 02:27   US Abdomen Complete  Result Date: 01/24/2020 CLINICAL DATA:  Elevated liver function tests. Acute renal insufficiency. EXAM: ABDOMEN ULTRASOUND COMPLETE COMPARISON:  Renal ultrasound, dated October 13, 2017.  FINDINGS: Gallbladder: No gallstones or wall thickening visualized (3.0 mm). No sonographic Murphy sign noted by sonographer. Common bile duct: Diameter: 3.9 mm Liver: No focal lesion identified. There is diffusely increased echogenicity of the liver parenchyma. Portal vein is patent on color Doppler imaging with normal direction of blood flow towards the liver. IVC: No abnormality visualized. Pancreas: Visualized portion unremarkable. Spleen: Size (4.2 cm) and appearance within normal limits. Right Kidney: Length: 10.2 cm. Echogenicity within normal limits. No mass or hydronephrosis visualized. Left Kidney: Length: 10.6 cm. Echogenicity within normal limits. No mass or hydronephrosis visualized. Abdominal aorta: No aneurysm visualized. Other findings: No free fluid is identified. IMPRESSION: Fatty liver. Electronically Signed   By: Aram Candelahaddeus  Houston M.D.   On: 01/24/2020 22:03   DG Chest Port 1 View  Result Date: 01/24/2020 CLINICAL DATA:  Possible seizure EXAM: PORTABLE CHEST 1 VIEW COMPARISON:  10/16/2017 FINDINGS: The heart size and mediastinal contours are within normal limits. Both lungs are clear. The visualized skeletal structures are unremarkable. IMPRESSION: No active disease. Electronically Signed   By: Deatra RobinsonKevin  Herman M.D.   On: 01/24/2020 02:26     Time Spent in minutes  30     Laverna PeaceShayla D Quan Cybulski M.D on 01/28/2020 at 9:11 PM  To page go to [email or web address] - password [email or web address]

## 2020-01-28 NOTE — Progress Notes (Signed)
RN discussed importance of mobilization with patient and potential discharge with home health. Pt is reluctant to get out of bed with nursing staff and inquired about physical therapist revaluation.

## 2020-01-28 NOTE — TOC Progression Note (Signed)
Transition of Care [facility]) - Progression Note    Patient Details  Name: Ashley Bradley MRN: [id number] Date of Birth: 1950/10/28  Transition of Care [facility]) CM/SW Contact  Corrisa Gibby, [name], RN Phone Number: 01/28/2020, 2:53 PM  Clinical Narrative: Frances Furbish for HHPT. No further CM needs.      Expected Discharge Plan: Home w Home Health Services Barriers to Discharge: Continued Medical Work up  Expected Discharge Plan and Services Expected Discharge Plan: Home w Home Health Services                                               Social Determinants of Health (SDOH) Interventions    Readmission Risk Interventions No flowsheet data found.

## 2020-01-28 NOTE — Care Management Important Message (Signed)
Important Message  Patient Details IM Letter presented to the Patient Name: Ashley Bradley MRN: [id number] Date of Birth: 03/14/1951   Medicare Important Message Given:  Yes     [name] 01/28/2020, 1:15 PM

## 2020-01-29 LAB — GLUCOSE, CAPILLARY
Glucose-Capillary: 100 mg/dL — ABNORMAL HIGH (ref 70–99)
Glucose-Capillary: 104 mg/dL — ABNORMAL HIGH (ref 70–99)
Glucose-Capillary: 109 mg/dL — ABNORMAL HIGH (ref 70–99)
Glucose-Capillary: 110 mg/dL — ABNORMAL HIGH (ref 70–99)
Glucose-Capillary: 113 mg/dL — ABNORMAL HIGH (ref 70–99)
Glucose-Capillary: 92 mg/dL (ref 70–99)

## 2020-01-29 LAB — COMPREHENSIVE METABOLIC PANEL
ALT: 48 U/L — ABNORMAL HIGH (ref 0–44)
AST: 29 U/L (ref 15–41)
Albumin: 4 g/dL (ref 3.5–5.0)
Alkaline Phosphatase: 122 U/L (ref 38–126)
Anion gap: 10 (ref 5–15)
BUN: 10 mg/dL (ref 8–23)
CO2: 25 mmol/L (ref 22–32)
Calcium: 9.5 mg/dL (ref 8.9–10.3)
Chloride: 101 mmol/L (ref 98–111)
Creatinine, Ser: 0.38 mg/dL — ABNORMAL LOW (ref 0.44–1.00)
GFR calc Af Amer: >60 mL/min (ref >60)
GFR calc non Af Amer: >60 mL/min (ref >60)
Glucose, Bld: 90 mg/dL (ref 70–99)
Potassium: 3.5 mmol/L (ref 3.5–5.1)
Sodium: 136 mmol/L (ref 135–145)
Total Bilirubin: 1 mg/dL (ref 0.3–1.2)
Total Protein: 6.8 g/dL (ref 6.5–8.1)

## 2020-01-29 MED ORDER — LEVETIRACETAM 1000 MG PO TABS: 1000.0000 mg | ORAL_TABLET | Freq: Two times a day (BID) | ORAL | 1 refills | Status: DC

## 2020-01-29 MED ORDER — THIAMINE HCL 100 MG PO TABS: 100.0000 mg | ORAL_TABLET | Freq: Every day | ORAL | 3 refills | Status: DC

## 2020-01-29 NOTE — Care Management (Signed)
Transition of Care (TOC) -30 day Note       Patient Details  Name: Ashley Bradley. Groom BIP:[id number] Date of Birth:06/26/1951  Transition of Care [facility]) CM/SW Contact  Name:[name] [facility]  Phone Number:[phone] Date:01/29/20 Time:1:50p   MUST ID: [id number]   To Whom it May Concern:   Please be advised that the above patient will require a short-term nursing home stay, anticipated 30 days or less rehabilitation and strengthening. The plan is for return home.

## 2020-01-29 NOTE — Plan of Care (Signed)
  Problem: Education: Goal: Knowledge of General Education information will improve Description: Including pain rating scale, medication(s)/side effects and non-pharmacologic comfort measures Outcome: Progressing   Problem: Activity: Goal: Risk for activity intolerance will decrease Outcome: Progressing   Problem: Nutrition: Goal: Adequate nutrition will be maintained Outcome: Progressing   Problem: Coping: Goal: Level of anxiety will decrease Outcome: Progressing   

## 2020-01-29 NOTE — Progress Notes (Signed)
Physical Therapy Treatment Patient Details Name: Ashley Bradley MRN: [id number] DOB: May 24, 1951 Today's Date: 01/29/2020    History of Present Illness 69 year old female with medical history significant for hypertension, prior MRSA bacteremia (2019), alcohol abuse, cerebral palsy with right-sided residual weakness who presented on 7/23 with a desire to stop drinking alcohol and had a witnessed convulsive type movements concerning for alcohol withdrawal seizure in the [facility] long emergency room.  Patient was given Ativan and Keppra and started on Precedex drip for presumed alcohol withdrawal with critical care management.    PT Comments    Pt resting upon arrival, easy to arouse, reports she got to chair with RN earlier and sat up for 2 hours and is now exhausted. Pt is motivated and agreeable to PT and is also in agreement with SNF now. Pt tolerates therapeutic exercises with intermittent cues for form and decreased R AROM due to weakness. Attempted STS reps from EOB, able to clear bottom but unable to fully extend hips and knees with assist from therapist due to weakness. Pt able to complete bed mobility without physical assist, but does use handrail to assist. Patient will benefit from continued physical therapy in hospital and recommendations below to increase strength, balance, endurance for safe ADLs and gait.    Follow Up Recommendations  SNF;Supervision/Assistance - 24 hour     Equipment Recommendations  None recommended by PT    Recommendations for Other Services       Precautions / Restrictions Precautions Precautions: Fall Restrictions Weight Bearing Restrictions: No    Mobility  Bed Mobility Overal bed mobility: Needs Assistance Bed Mobility: Supine to Sit;Sit to Supine  Supine to sit: Min guard;HOB elevated Sit to supine: Min guard   General bed mobility comments: elevated HOB and use of bedrail requiring increased time to come to sitting EOB; increased time to return  to supine without physical assist  Transfers Overall transfer level: Needs assistance Equipment used: Rolling walker (2 wheeled) Transfers: Sit to/from Stand;Lateral/Scoot Transfers Sit to Stand: Max assist   Lateral/Scoot Transfers: Min guard General transfer comment: attempted STS 3 reps, able to clear bottom with BUE assisting to power up, but unable to extend bil knees and hips for upright posture; pt able to lateral scoot to [facility] with min G assist  Ambulation/Gait  General Gait Details: unable   Stairs             Wheelchair Mobility    Modified Rankin (Stroke Patients Only)       Balance Overall balance assessment: Needs assistance Sitting-balance support: Feet supported;Bilateral upper extremity supported Sitting balance-Leahy Scale: Good Sitting balance - Comments: seated EOB       Cognition Arousal/Alertness: Awake/alert Behavior During Therapy: WFL for tasks assessed/performed Overall Cognitive Status: Within Functional Limits for tasks assessed         Exercises General Exercises - Lower Extremity Long Arc Quad: AROM;Strengthening;Seated;10 reps (R decreased AROM) Hip Flexion/Marching: AROM;Strengthening;Seated;Both;10 reps Toe Raises: AROM;Strengthening;Seated;Both;10 reps (R decreased AROM) Heel Raises: AROM;Strengthening;Seated;Both;10 reps (R decreased AROM)    General Comments General comments (skin integrity, edema, etc.): Pt without complaints, HR 100-110 with mobility      Pertinent Vitals/Pain Pain Assessment: No/denies pain    Home Living                      Prior Function            PT Goals (current goals can now be found in the care plan section) Acute  Rehab PT Goals Patient Stated Goal: "go back home with my husband" PT Goal Formulation: With patient Time For Goal Achievement: 02/03/20 Potential to Achieve Goals: Good Progress towards PT goals: Progressing toward goals    Frequency    Min 3X/week      PT  Plan Current plan remains appropriate    Co-evaluation              AM-PAC PT "6 Clicks" Mobility   Outcome Measure  Help needed turning from your back to your side while in a flat bed without using bedrails?: A Little Help needed moving from lying on your back to sitting on the side of a flat bed without using bedrails?: A Little Help needed moving to and from a bed to a chair (including a wheelchair)?: A Lot Help needed standing up from a chair using your arms (e.g., wheelchair or bedside chair)?: A Lot Help needed to walk in hospital room?: Total Help needed climbing 3-5 steps with a railing? : Total 6 Click Score: 12    End of Session   Activity Tolerance: Patient tolerated treatment well Patient left: in bed;with call bell/phone within reach;with bed alarm set Nurse Communication: Mobility status PT Visit Diagnosis: Other abnormalities of gait and mobility (R26.89);Muscle weakness (generalized) (M62.81);Difficulty in walking, not elsewhere classified (R26.2)     Time: 2094-7096 PT Time Calculation (min) (ACUTE ONLY): 13 min  Charges:  $Therapeutic Exercise: 8-22 mins                      Tori Lavin [place] PT, DPT 01/29/20, 3:51 PM

## 2020-01-29 NOTE — Discharge Summary (Addendum)
Physician Discharge Summary  Ashley Bradley MPN:[id number] DOB: Sep 12, 1950 DOA: 01/24/2020  PCP: Sigmund Hazel, MD  Admit date: 01/24/2020 Discharge date: 01/30/2020  Admitted From: Home.  Disposition: snf   Recommendations for Outpatient Follow-up:  1. Follow up with PCP in 1-2 weeks 2. Please obtain BMP/CBC in one week   Home Health:yes   Discharge Condition: STABLE.  CODE STATUS: FULL CODE.  Diet recommendation: Heart Healthy  Brief/Interim Summary: Ashley Bradley is a 69 year old female with medical history significant for hypertension, prior MRSA bacteremia (2019), alcohol abuse, cerebral palsy with right-sided residual weakness who presented on 7/23 with a desire to stop drinking alcohol and had a witnessed convulsive type movements concerning for alcohol withdrawal seizure in the [facility] long emergency room.  Patient was given Ativan and Keppra and started on Precedex drip for presumed alcohol withdrawal with critical care management.  During hospital course she was found to have hyponatremia, increased anion gap metabolic acidosis and transaminitis presume related to alcohol abuse.  Patient also found to have ammonia level of 102 prompting lactulose therapy and improvement in mental status and resolution of ammonia 22.  Patient was able to transition off of Precedex within 24 hours and with resolution of delirium tremens transferred to progressive unit under [facility] care on 7/25   Discharge Diagnoses:  Active Problems:   DTs (delirium tremens) (HCC)   Alcohol withdrawal seizure (HCC)  Metabolic encephalopathy, multifactorial etiology including alcohol withdrawal/seizure and hepatic encephalopathy, now resolved.  Patient is alert and oriented x4, has no active signs of alcohol withdrawal, no asterixis, ammonia is down trended from 100-22 -Discontinued lactulose, monitor bowel movements -Continue to monitor on CIWA protocol, as needed Ativan -Oral Keppra  Witnessed seizure-like activity.   No recurrent episodes.  Occurred in the setting of alcohol withdrawal.  Reports history of cerebral palsy and prior seizure occurring as a kid remotely. -On Keppra -Seizure precautions  Alcohol abuse.  Patient would like to abstain.  Admits to using alcohol to deal with life stressors. -TOC consulted for substance abuse counseling, outpatient resources, already familiar with AA program -Daily folic acid, thiamine, multivitamin  Alcoholic hepatitis.  AST 170, ALT 92 on admission.  Now improved.  Has no abdominal pain.   Hypokalemia.  Magnesium within normal limits.  Laxatives alcohol abuse. -Replete, goal potassium 4 -monitor telemetry  Asymptomatic SVT.  6 beats reported on telemetry on 7/25 -Monitor potassium (goal of 4), mag (goal of 2), replete as needed  Hyponatremia.  Likely related to decreased salt low related to alcohol abuse, now resolved  Cerebral palsy.  Has residual right-sided weakness though states it does not affect her ability to ambulate -PT to eval and treat--recommending SNF Profound weakness and decreased ability to mobilize, patient elects for 24-hour assist with husband home health PT  Hypertension, elevated to 180s -Resume home losartan  GERD, stable -Continue home PPI  Mood disorder, stable -Continue home Prozac   Discharge Instructions  Discharge Instructions    Diet - low sodium heart healthy   Complete by: As directed    Discharge instructions   Complete by: As directed    Please follow up with PCP in one week.     Allergies as of 01/29/2020      Reactions   Macrodantin [nitrofurantoin] Nausea And Vomiting      Medication List    STOP taking these medications   vancomycin  IVPB     TAKE these medications   aspirin EC 81 MG tablet Take 81 mg by  mouth daily.   FLUoxetine 40 MG capsule Commonly known as: PROZAC Take 40 mg by mouth every morning. What changed: Another medication with the same name was removed. Continue  taking this medication, and follow the directions you see here.   folic acid 1 MG tablet Commonly known as: FOLVITE Take 1 tablet (1 mg total) by mouth daily.   levETIRAcetam 1000 MG tablet Commonly known as: KEPPRA Take 1 tablet (1,000 mg total) by mouth 2 (two) times daily.   loperamide 2 MG capsule Commonly known as: IMODIUM Take 1 capsule (2 mg total) by mouth every 8 (eight) hours as needed for diarrhea or loose stools.   losartan 50 MG tablet Commonly known as: COZAAR Take 1 tablet (50 mg total) by mouth daily. What changed: Another medication with the same name was removed. Continue taking this medication, and follow the directions you see here.   multivitamin with minerals Tabs tablet Take 1 tablet by mouth daily.   pantoprazole 40 MG tablet Commonly known as: PROTONIX Take 1 tablet (40 mg total) by mouth daily.   saccharomyces boulardii 250 MG capsule Commonly known as: FLORASTOR Take 250 mg by mouth 2 (two) times daily.   thiamine 100 MG tablet Take 1 tablet (100 mg total) by mouth daily.       Follow-up Information    Care, [facility] Follow up.   Specialty: Home Health Services Why: HH physical therapy Contact information: [address] [place] [address] [phone]              Allergies  Allergen Reactions  . Macrodantin [Nitrofurantoin] Nausea And Vomiting    Consultations:  None.     Procedures/Studies: CT Head Wo Contrast  Result Date: 01/24/2020 CLINICAL DATA:  Syncope and possible seizure EXAM: CT HEAD WITHOUT CONTRAST TECHNIQUE: Contiguous axial images were obtained from the base of the skull through the vertex without intravenous contrast. COMPARISON:  10/13/2017 FINDINGS: Brain: There is no mass, hemorrhage or extra-axial collection. The size and configuration of the ventricles and extra-axial CSF spaces are normal. The brain parenchyma is normal, without acute or chronic infarction. Vascular: No abnormal  hyperdensity of the major intracranial arteries or dural venous sinuses. No intracranial atherosclerosis. Skull: The visualized skull base, calvarium and extracranial soft tissues are normal. Sinuses/Orbits: No fluid levels or advanced mucosal thickening of the visualized paranasal sinuses. No mastoid or middle ear effusion. The orbits are normal. IMPRESSION: Normal head CT. Electronically Signed   By: Deatra Robinson M.D.   On: 01/24/2020 02:27   US Abdomen Complete  Result Date: 01/24/2020 CLINICAL DATA:  Elevated liver function tests. Acute renal insufficiency. EXAM: ABDOMEN ULTRASOUND COMPLETE COMPARISON:  Renal ultrasound, dated October 13, 2017. FINDINGS: Gallbladder: No gallstones or wall thickening visualized (3.0 mm). No sonographic Murphy sign noted by sonographer. Common bile duct: Diameter: 3.9 mm Liver: No focal lesion identified. There is diffusely increased echogenicity of the liver parenchyma. Portal vein is patent on color Doppler imaging with normal direction of blood flow towards the liver. IVC: No abnormality visualized. Pancreas: Visualized portion unremarkable. Spleen: Size (4.2 cm) and appearance within normal limits. Right Kidney: Length: 10.2 cm. Echogenicity within normal limits. No mass or hydronephrosis visualized. Left Kidney: Length: 10.6 cm. Echogenicity within normal limits. No mass or hydronephrosis visualized. Abdominal aorta: No aneurysm visualized. Other findings: No free fluid is identified. IMPRESSION: Fatty liver. Electronically Signed   By: Aram Candela M.D.   On: 01/24/2020 22:03   DG Chest [facility]  1 View  Result Date: 01/24/2020 CLINICAL DATA:  Possible seizure EXAM: PORTABLE CHEST 1 VIEW COMPARISON:  10/16/2017 FINDINGS: The heart size and mediastinal contours are within normal limits. Both lungs are clear. The visualized skeletal structures are unremarkable. IMPRESSION: No active disease. Electronically Signed   By: Deatra Robinson M.D.   On: 01/24/2020 02:26        Subjective: No new complaints.   Discharge Exam: Vitals:   01/28/20 2035 01/29/20 0445  BP: (!) 144/80 (!) 135/79  Pulse: 87 95  Resp: 18 18  Temp: 98.2 F (36.8 C) 98.6 F (37 C)  SpO2: 97% 95%   Vitals:   01/28/20 1040 01/28/20 1301 01/28/20 2035 01/29/20 0445  BP: (!) 144/83 (!) 136/83 (!) 144/80 (!) 135/79  Pulse: 98 96 87 95  Resp:  20 18 18   Temp: 98.4 F (36.9 C) 99.7 F (37.6 C) 98.2 F (36.8 C) 98.6 F (37 C)  TempSrc: Oral Oral Oral Oral  SpO2: 97% 97% 97% 95%  Weight:      Height:        General: Pt is alert, awake, not in acute distress Cardiovascular: RRR, S1/S2 +, no rubs, no gallops Respiratory: CTA bilaterally, no wheezing, no rhonchi Abdominal: Soft, NT, ND, bowel sounds + Extremities: no edema, no cyanosis    The results of significant diagnostics from this hospitalization (including imaging, microbiology, ancillary and laboratory) are listed below for reference.     Microbiology: Recent Results (from the past 240 hour(s))  SARS Coronavirus 2 by RT PCR (hospital order, performed in [facility] hospital lab) Nasopharyngeal Nasopharyngeal Swab     Status: None   Collection Time: 01/24/20  1:08 AM   Specimen: Nasopharyngeal Swab  Result Value Ref Range Status   SARS Coronavirus 2 NEGATIVE NEGATIVE Final    Comment: (NOTE) SARS-CoV-2 target nucleic acids are NOT DETECTED.  The SARS-CoV-2 RNA is generally detectable in upper and lower respiratory specimens during the acute phase of infection. The lowest concentration of SARS-CoV-2 viral copies this assay can detect is 250 copies / mL. A negative result does not preclude SARS-CoV-2 infection and should not be used as the sole basis for treatment or other patient management decisions.  A negative result may occur with improper specimen collection / handling, submission of specimen other than nasopharyngeal swab, presence of viral mutation(s) within the areas targeted by this assay, and  inadequate number of viral copies (<250 copies / mL). A negative result must be combined with clinical observations, patient history, and epidemiological information.  Fact Sheet for Patients:   01/26/20  Fact Sheet for Healthcare Providers: BoilerBrush.com.cy  This test is not yet approved or  cleared by the https://pope.com/ FDA and has been authorized for detection and/or diagnosis of SARS-CoV-2 by FDA under an Emergency Use Authorization (EUA).  This EUA will remain in effect (meaning this test can be used) for the duration of the COVID-19 declaration under Section 564(b)(1) of the Act, 21 U.S.C. section 360bbb-3(b)(1), unless the authorization is terminated or revoked sooner.  Performed at [facility], [address] [place], [place]   MRSA PCR Screening     Status: None   Collection Time: 01/24/20  5:01 PM   Specimen: Nasal Mucosa; Nasopharyngeal  Result Value Ref Range Status   MRSA by PCR NEGATIVE NEGATIVE Final    Comment:        The GeneXpert MRSA Assay (FDA approved for NASAL specimens only), is one component of a  comprehensive MRSA colonization surveillance program. It is not intended to diagnose MRSA infection nor to guide or monitor treatment for MRSA infections. Performed at [facility], [address] [place], [place] [id number]      Labs: BNP (last 3 results) No results for input(s): BNP in the last 8760 hours. Basic Metabolic Panel: Recent Labs  Lab 01/24/20 0510 01/24/20 1452 01/25/20 0230 01/25/20 0230 01/25/20 0948 01/26/20 [id number] 01/27/20 0622 01/28/20 0522 01/29/20 0522  NA  --    < > 135   < > 136 136 134* 133* 136  K  --    < > 3.5   < > 3.6 3.2* 3.5 3.9 3.5  CL  --    < > 104   < > 105 105 99 98 101  CO2  --    < > 21*   < > 20* 23 24 24 25   GLUCOSE  --    < > 95   < > 125* 124* 93 95 90  BUN  --    < > 10   < > 10 7* 9 9 10    CREATININE  --    < > 0.46   < > 0.43* 0.42* 0.35* 0.44 0.38*  CALCIUM  --    < > 8.7*   < > 8.5* 8.8* 9.3 9.5 9.5  MG 2.4  --  2.2  --  2.0 2.0 1.9  --   --   PHOS  --   --  2.1*  --  1.8* 2.5  --   --   --    < > = values in this interval not displayed.   Liver Function Tests: Recent Labs  Lab 01/25/20 0230 01/25/20 0948 01/26/20 0633 01/28/20 0522 01/29/20 0522  AST 100* 87* 67* 34 29  ALT 71* 68* 63* 50* 48*  ALKPHOS 107 112 111 126 122  BILITOT 0.8 1.0 1.0 0.6 1.0  PROT 6.3* 6.4* 6.4* 6.8 6.8  ALBUMIN 3.8 3.8 3.7 3.7 4.0   Recent Labs  Lab 01/24/20 1452  LIPASE 21  AMYLASE 32   Recent Labs  Lab 01/24/20 0111 01/24/20 1452 01/25/20 0230  AMMONIA 102* 17 22   CBC: Recent Labs  Lab 01/24/20 0111 01/24/20 0111 01/25/20 0230 01/25/20 0948 01/26/20 0633 01/27/20 0622 01/28/20 0522  WBC 8.5   < > 4.5 4.7 4.8 4.9 5.1  NEUTROABS 5.4  --   --   --   --   --   --   HGB 14.4   < > 10.9* 11.1* 11.5* 12.0 12.2  HCT 43.0   < > 34.6* 34.2* 34.5* 36.5 38.0  MCV 100.9*   < > 106.1* 104.9* 102.1* 102.2* 103.0*  PLT 207   < > 142* 145* 142* 168 220   < > = values in this interval not displayed.   Cardiac Enzymes: No results for input(s): CKTOTAL, CKMB, CKMBINDEX, TROPONINI in the last 168 hours. BNP: Invalid input(s): POCBNP CBG: Recent Labs  Lab 01/28/20 1632 01/28/20 2035 01/29/20 0044 01/29/20 0445 01/29/20 0739  GLUCAP 120* 114* 113* 100* 92   D-Dimer No results for input(s): DDIMER in the last 72 hours. Hgb A1c No results for input(s): HGBA1C in the last 72 hours. Lipid Profile No results for input(s): CHOL, HDL, LDLCALC, TRIG, CHOLHDL, LDLDIRECT in the last 72 hours. Thyroid function studies No results for input(s): TSH, T4TOTAL, T3FREE, THYROIDAB in the last 72 hours.  Invalid input(s): FREET3 Anemia work up No  results for input(s): VITAMINB12, FOLATE, FERRITIN, TIBC, IRON, RETICCTPCT in the last 72 hours. Urinalysis    Component Value  Date/Time   COLORURINE YELLOW 01/24/2020 0626   APPEARANCEUR HAZY (A) 01/24/2020 0626   LABSPEC 1.021 01/24/2020 0626   PHURINE 5.0 01/24/2020 0626   GLUCOSEU NEGATIVE 01/24/2020 0626   HGBUR LARGE (A) 01/24/2020 0626   BILIRUBINUR NEGATIVE 01/24/2020 0626   KETONESUR 20 (A) 01/24/2020 0626   PROTEINUR 100 (A) 01/24/2020 0626   UROBILINOGEN 1.0 09/23/2013 0732   NITRITE NEGATIVE 01/24/2020 0626   LEUKOCYTESUR NEGATIVE 01/24/2020 0626   Sepsis Labs Invalid input(s): PROCALCITONIN,  WBC,  LACTICIDVEN Microbiology Recent Results (from the past 240 hour(s))  SARS Coronavirus 2 by RT PCR (hospital order, performed in [facility] hospital lab) Nasopharyngeal Nasopharyngeal Swab     Status: None   Collection Time: 01/24/20  1:08 AM   Specimen: Nasopharyngeal Swab  Result Value Ref Range Status   SARS Coronavirus 2 NEGATIVE NEGATIVE Final    Comment: (NOTE) SARS-CoV-2 target nucleic acids are NOT DETECTED.  The SARS-CoV-2 RNA is generally detectable in upper and lower respiratory specimens during the acute phase of infection. The lowest concentration of SARS-CoV-2 viral copies this assay can detect is 250 copies / mL. A negative result does not preclude SARS-CoV-2 infection and should not be used as the sole basis for treatment or other patient management decisions.  A negative result may occur with improper specimen collection / handling, submission of specimen other than nasopharyngeal swab, presence of viral mutation(s) within the areas targeted by this assay, and inadequate number of viral copies (<250 copies / mL). A negative result must be combined with clinical observations, patient history, and epidemiological information.  Fact Sheet for Patients:   BoilerBrush.com.cy  Fact Sheet for Healthcare Providers: https://pope.com/  This test is not yet approved or  cleared by the [place] FDA and has been authorized for  detection and/or diagnosis of SARS-CoV-2 by FDA under an Emergency Use Authorization (EUA).  This EUA will remain in effect (meaning this test can be used) for the duration of the COVID-19 declaration under Section 564(b)(1) of the Act, 21 U.S.C. section 360bbb-3(b)(1), unless the authorization is terminated or revoked sooner.  Performed at [facility], [address], [place], [place] [address]   MRSA PCR Screening     Status: None   Collection Time: 01/24/20  5:01 PM   Specimen: Nasal Mucosa; Nasopharyngeal  Result Value Ref Range Status   MRSA by PCR NEGATIVE NEGATIVE Final    Comment:        The GeneXpert MRSA Assay (FDA approved for NASAL specimens only), is one component of a comprehensive MRSA colonization surveillance program. It is not intended to diagnose MRSA infection nor to guide or monitor treatment for MRSA infections. Performed at [facility], [address] [place], [place] [address]      Time coordinating discharge:32 minutes.   SIGNED:   Kathlen Mody, MD  [facility] 01/29/2020, 11:08 AM  Addendum Pt worked with PT, will need SNF ON Discharge.    Kathlen Mody, MD

## 2020-01-29 NOTE — Progress Notes (Signed)
Ambulated with patient and she is much weaker than she expected.  Was not able to ambulate to chair with walker without heavy assist.  Will put in a PT order to evaluate for rehab again.

## 2020-01-29 NOTE — [place] FL2 (Signed)
[place] MEDICAID FL2 LEVEL OF CARE SCREENING TOOL     IDENTIFICATION  Patient Name: Ashley Bradley Birthdate: [birth year] Sex: female Admission Date (Current Location): 01/24/2020  [facility] and [place] Number:  Producer, television/film/video and Address:  [facility],  [address] [place] [address]      Provider Number: [id number]  Attending Physician Name and Address:  Kathlen Mody, MD  Relative Name and Phone Number:  [name] spouse [phone]    Current Level of Care: Hospital Recommended Level of Care: Skilled Nursing Facility Prior Approval Number:    Date Approved/Denied:   PASRR Number:    Discharge Plan: SNF    Current Diagnoses: Patient Active Problem List   Diagnosis Date Noted  . DTs (delirium tremens) (HCC) 01/24/2020  . Alcohol withdrawal seizure (HCC) 01/24/2020  . High anion gap metabolic acidosis 10/13/2017  . Melena 06/29/2016  . Syncope 01/26/2016  . Prolonged Q-T interval on ECG 01/26/2016  . History of Clostridium difficile colitis 01/26/2016  . Elevated troponin   . Alcohol use disorder, moderate, in early remission, dependence (HCC) 01/21/2016  . AKI (acute kidney injury) (HCC) 01/16/2016  . Seizures (HCC) 09/24/2013  . Hyponatremia 09/20/2013  . Major depression 03/19/2013  . Alcohol intoxication in relapsed alcoholic (HCC) 03/19/2013    Orientation RESPIRATION BLADDER Height & Weight     Self, Time, Place  Normal Continent Weight: 70.5 kg Height:  5\' 5"  (165.1 cm)  BEHAVIORAL SYMPTOMS/MOOD NEUROLOGICAL BOWEL NUTRITION STATUS    Convulsions/Seizures Continent Diet (Regular)  AMBULATORY STATUS COMMUNICATION OF NEEDS Skin   Limited Assist Verbally Normal                       Personal Care Assistance Level of Assistance  Bathing, Feeding, Dressing Bathing Assistance: Limited assistance Feeding assistance: Limited assistance Dressing Assistance: Limited assistance     Functional Limitations Info  Sight,  Hearing, Speech Sight Info: Adequate Hearing Info: Adequate Speech Info: Adequate    SPECIAL CARE FACTORS FREQUENCY  PT (By licensed PT), OT (By licensed OT)     PT Frequency: 5x week OT Frequency: 5x week            Contractures Contractures Info: Not present    Additional Factors Info  Code Status, Psychotropic Code Status Info: Full   Psychotropic Info: Cerebral Palsy;Alcohol use;ativan;see MAR         Current Medications (01/29/2020):  This is the current hospital active medication list Current Facility-Administered Medications  Medication Dose Route Frequency Provider Last Rate Last Admin  . docusate sodium (COLACE) capsule 100 mg  100 mg Oral BID PRN 01/31/2020 B, NP      . FLUoxetine (PROZAC) capsule 40 mg  40 mg Oral q morning - 10a Selmer Dominion D, MD   40 mg at 01/29/20 0835  . folic acid (FOLVITE) tablet 1 mg  1 mg Oral Daily Mannam, Praveen, MD   1 mg at 01/29/20 0835  . heparin injection 5,000 Units  5,000 Units Subcutaneous Q8H 01/31/20 B, NP   5,000 Units at 01/29/20 01/31/20  . insulin aspart (novoLOG) injection 0-9 Units  0-9 Units Subcutaneous Q4H 4268, MD   1 Units at 01/27/20 2000  . levETIRAcetam (KEPPRA) tablet 1,000 mg  1,000 mg Oral BID 01/29/20 D, MD   1,000 mg at 01/29/20 0835  . LORazepam (ATIVAN) injection 1-2 mg  1-2 mg Intravenous Q1H PRN 01/31/20, NP  1 mg at 01/25/20 0107  . losartan (COZAAR) tablet 50 mg  50 mg Oral Daily Roberto Scales D, MD   50 mg at 01/29/20 0835  . multivitamin with minerals tablet 1 tablet  1 tablet Oral Daily Mannam, Praveen, MD   1 tablet at 01/29/20 0835  . pantoprazole (PROTONIX) EC tablet 40 mg  40 mg Oral Daily Roberto Scales D, MD   40 mg at 01/29/20 0836  . polyethylene glycol (MIRALAX / GLYCOLAX) packet 17 g  17 g Oral Daily PRN Selmer Dominion B, NP      . thiamine tablet 100 mg  100 mg Oral Daily Mannam, Praveen, MD   100 mg at 01/29/20 0835   Or  . thiamine (B-1) injection  100 mg  100 mg Intravenous Daily Mannam, Praveen, MD      . traMADol (ULTRAM) tablet 50 mg  50 mg Oral Q6H PRN Roberto Scales D, MD   50 mg at 01/29/20 1914     Discharge Medications: Please see discharge summary for a list of discharge medications.  Relevant Imaging Results:  Relevant Lab Results:   Additional Information SS#[phone], [name], [place]

## 2020-01-29 NOTE — TOC Progression Note (Signed)
Transition of Care [facility]) - Progression Note    Patient Details  Name: Ashley Bradley MRN: [id number] Date of Birth: 02/16/1951  [facility] CM/SW Contact  Atara Paterson, [name], RN Phone Number: 01/29/2020, 2:39 PM  Clinical Narrative: Patient now agreeable to SNF-faxed out awaiting pasrr,bed offers, & then insurance auth.      Expected Discharge Plan: Skilled Nursing Facility Barriers to Discharge: SNF Pending bed offer, Insurance Authorization, Awaiting State Approval (PASRR)  Expected Discharge Plan and Services Expected Discharge Plan: Skilled Nursing Facility         Expected Discharge Date: 01/29/20                                     Social Determinants of Health (SDOH) Interventions    Readmission Risk Interventions No flowsheet data found.

## 2020-01-30 DIAGNOSIS — N179 Acute kidney failure, unspecified: Secondary | ICD-10-CM

## 2020-01-30 LAB — COMPREHENSIVE METABOLIC PANEL
ALT: 47 U/L — ABNORMAL HIGH (ref 0–44)
AST: 28 U/L (ref 15–41)
Albumin: 3.9 g/dL (ref 3.5–5.0)
Alkaline Phosphatase: 132 U/L — ABNORMAL HIGH (ref 38–126)
Anion gap: 10 (ref 5–15)
BUN: 9 mg/dL (ref 8–23)
CO2: 26 mmol/L (ref 22–32)
Calcium: 9.5 mg/dL (ref 8.9–10.3)
Chloride: 101 mmol/L (ref 98–111)
Creatinine, Ser: 0.4 mg/dL — ABNORMAL LOW (ref 0.44–1.00)
GFR calc Af Amer: >60 mL/min (ref >60)
GFR calc non Af Amer: >60 mL/min (ref >60)
Glucose, Bld: 98 mg/dL (ref 70–99)
Potassium: 3.7 mmol/L (ref 3.5–5.1)
Sodium: 137 mmol/L (ref 135–145)
Total Bilirubin: 0.7 mg/dL (ref 0.3–1.2)
Total Protein: 7 g/dL (ref 6.5–8.1)

## 2020-01-30 LAB — GLUCOSE, CAPILLARY
Glucose-Capillary: 112 mg/dL — ABNORMAL HIGH (ref 70–99)
Glucose-Capillary: 106 mg/dL — ABNORMAL HIGH (ref 70–99)
Glucose-Capillary: 111 mg/dL — ABNORMAL HIGH (ref 70–99)
Glucose-Capillary: 143 mg/dL — ABNORMAL HIGH (ref 70–99)
Glucose-Capillary: 124 mg/dL — ABNORMAL HIGH (ref 70–99)
Glucose-Capillary: 106 mg/dL — ABNORMAL HIGH (ref 70–99)

## 2020-01-30 NOTE — TOC Progression Note (Signed)
Transition of Care [facility]) - Progression Note    Patient Details  Name: Ashley [place] MRN: [id number] Date of Birth: 1951/02/16  Transition of Care [facility]) CM/SW Contact  Artez Regis, [name], RN Phone Number: 01/30/2020, 9:18 AM  Clinical Narrative:  . 1.7 mi [facility], [facility] [address] [place], [place] [address] [phone] Overall rating Below average 2. 2 mi [facility] [address] [place], [place] [address] [phone] Overall rating Below average 3. 2.1 mi [address] at [place], [place] [address] [place], [place] [address] [phone] Overall rating Much below average 4. 2.2 mi [facility] [address] [place], [place] [address] [phone] Overall rating Much above average 5. 2.2 mi [facility] and [facility] [address] [place], [place] [address] [phone] Overall rating Below average 6. 2.7 mi [facility] [address] [place], [place] [address] [phone] Overall rating Much above average 7. 3.2 mi [facility] and [facility] [address] [place], [place] [address] [phone] Overall rating Below average 8. 3.3 mi [facility] [address] [place], [place] [address] [phone] Overall rating Below average 9. 3.5 mi [facility] [address] [place], [place] [address] [phone] Overall rating Average 10. 4.3 mi [facility] [address] [place], [place] [address] [phone] Overall rating Below average 11. 4.7 mi [facility] [employer] [address] [place], [place] [address] [phone] Overall rating Much above average 12. 5.2 mi [facility] [address] [place], [place] [address] [phone] Overall rating Much  above average 13. 6.2 mi [facility] [address] [place], [place] [address] [phone] Overall rating Average 14. 8.1 mi [facility] [address] [place], [place] [address] [phone] Overall rating Above average 15. 8.1 mi [facility] [address] [place], [place] [address] [phone] Overall rating Much below average    16. 9.7 mi [facility] [address] [place], [place] [address] [phone] Overall rating Below average 17. 10 mi [facility] [address] [place], [place] [address] [phone] Overall rating Much above average 18. 11.4 mi [facility] [address] [place], [place] [address] [phone] Overall rating Much above average 19. 13.5 [facility] [address] [place], [place] [address] [phone] Overall rating Much below average 20. 13.5 mi [facility] [address] [place], [place] [address] [phone] Overall rating Much below average 21. 14.5 mi [facility] [address] [place], [place] [address] [phone] Overall rating Much below average 22. 14.9 mi [facility] [employer] [facility] [address] [place], [place] [address] [phone] Overall rating Much below average 23. 15.1 mi [facility] [address] [place], [place] [address] [phone] Overall rating Below average 24. 15.3 mi [facility] [address] [place], [place] [address] [phone] Overall rating Average 25. 16 mi [facility] [address] [place], [place] [address] [phone] Overall rating Much above average 26. 16.2 mi [facility] [address] [place], [place] [address] [phone] Overall rating Much above average 27. 16.6 mi [facility] [address] [place] [address]  [place], [place] [address] [phone] Overall rating Below average 28. 17.2 mi [employer] [place] [address] [place], [place] [address] [phone] Overall rating Average 29. 18.9 [facility] [address] [place], [place] [address] [phone] Overall rating Much below average 30. 19.4 mi [address] at [employer] at [facility], [place] [address] [phone] Overall rating Much above average 31. 20.6 mi [facility] and [facility] [address] [place], [place] [address] [phone] Overall rating Below average 32. 20.7 mi [facility] and [facility] [address] [place], [place] [address] [phone] Overall rating Average 33. 20.9 [facility] [address] [place], [place] [address] [phone] Overall rating Below average 34. 21.1 [facility] [address] [place], [place] [address] [phone] Overall rating Much above average 35. 22 mi [facility] [address] [place], [place] [address] [phone] Overall rating Below average 36. 22.4 mi [address] [place], [place] [address] [phone] Overall rating Below average 37. 22.7 mi [facility] [address] [place], [place] [address] [phone] Overall rating Above average 38. 22.8 [address] [place], [place] [address] [phone] Overall rating Much above average 39. 22.9 [facility] [address] [place], [place] [address] [phone] Overall rating Below average 40. 23 mi [facility] [address] [place], [place] [address] [phone] Overall rating Much below average 41. 24.4 mi [facility] [address] [place], [place] [address] [phone] Overall rating Below average 42. 24.6 [facility] [address] [place], [place] [address] [phone] Overall rating Much below average 43. 24.6 mi [facility] [address] [place], [place] [address] [phone] Overall rating Much below average 44. 24.9 mi [facility] [address] [place], [place] [address] [phone] Overall rating Much below average   Expected Discharge Plan: Skilled Nursing Facility Barriers to Discharge: SNF Pending bed offer, Insurance Authorization, Awaiting State Approval (PASRR)  Expected Discharge Plan and Services Expected Discharge Plan: Skilled Nursing Facility         Expected Discharge Date: 01/29/20                                     Social Determinants of Health (SDOH) Interventions    Readmission Risk Interventions No flowsheet data found.

## 2020-01-30 NOTE — TOC Progression Note (Signed)
Transition of Care [facility]) - Progression Note    Patient Details  Name: Ashley [place] MRN: [id number] Date of Birth: 06-15-1951  Transition of Care [facility]) CM/SW Contact  Amica Harron, [name], RN Phone Number: 01/30/2020, 10:49 AM  Clinical Narrative: Bed offers given-await choice;pasrr screen for level taken place-await level;once choice made will start insurance auth.      Expected Discharge Plan: Skilled Nursing Facility Barriers to Discharge: SNF Pending bed offer, Insurance Authorization, Awaiting State Approval (PASRR)  Expected Discharge Plan and Services Expected Discharge Plan: Skilled Nursing Facility         Expected Discharge Date: 01/29/20                                     Social Determinants of Health (SDOH) Interventions    Readmission Risk Interventions No flowsheet data found.

## 2020-01-30 NOTE — Progress Notes (Signed)
PROGRESS NOTE    Ashley [place]  UUV:[id number] DOB: 22-Dec-1950 DOA: 01/24/2020 PCP: Sigmund Hazel, MD    Chief Complaint  Patient presents with  . Seizures    Brief Narrative: Ashley Bradley is a 69 year old female with medical history significant for hypertension, prior MRSA bacteremia (2019), alcohol abuse, cerebral palsy with right-sided residual weakness who presented on 7/23 with a desire to stop drinking alcohol and had a witnessed convulsive type movements concerning for alcohol withdrawal seizure in the [facility] long emergency room. Patient was given Ativan and Keppra and started on Precedex drip for presumed alcohol withdrawal with critical care management. During hospital course she was found to have hyponatremia, increased anion gap metabolic acidosis and transaminitis presume related to alcohol abuse. Patient also found to have ammonia level of 102 prompting lactulose therapy and improvement in mental status and resolution of ammonia 22. Patient was able to transition off of Precedex within 24 hours and with resolution of delirium tremens transferred to progressive unit under TRH care on 7/25. No seizure activity.  PT eval recommending SNF.   Assessment & Plan:   Active Problems:   DTs (delirium tremens) (HCC)   Alcohol withdrawal seizure (HCC)   Acute metabolic encephalopathy Multifactorial secondary to alcohol withdrawal/seizures and hepatic encephalopathy. Resolved patient is currently alert and oriented without any signs of withdrawals.  Ammonia level normalized.  Continue to monitor on CIWA protocol and as needed Ativan.   Witnessed seizure-like activity No recurrent episodes occurred in the setting of alcohol withdrawal. History of cerebral palsy and prior seizure episode as a kid. Patient was started on Keppra, continue the same on discharge and recommend outpatient referral for neurology.    Alcohol abuse admits to using alcohol to deal with life stressors TOC  consulted for substance abuse counseling, outpatient resources given patient familiar with AA program Continue with folic acid, thiamine and multivitamin.   Mild alcoholic hepatitis Improving.  Recommend outpatient follow-up of the liver enzymes in about 4 to 6 weeks.    Hypokalemia Replaced.   Asymptomatic SVT Probably secondary to hypokalemia.    Hyponatremia.  Probably secondary to alcohol abuse Resolved with hydration.   Cerebral palsy PT evaluation recommending SNF.    Essential hypertension Well-controlled blood pressure parameters, resume home medications   GERD Stable.    Mood disorder Continue with Prozac.   DVT prophylaxis:  Code Status: FULL CODE.  Family Communication: none at bedside.  Disposition:   Status is: Inpatient  Remains inpatient appropriate because:Unsafe d/c plan   Dispo: The patient is from: Home              Anticipated d/c is to: SNF              Anticipated d/c date is: 1 day              Patient currently is medically stable to d/c.       Consultants:   None.    Procedures:none.   Antimicrobials: none.  Subjective:  No new complaints.   Objective: Vitals:   01/29/20 1309 01/29/20 2017 01/30/20 0434 01/30/20 1252  BP: (!) 132/77 (!) 146/73 (!) 137/79 127/72  Pulse: 98 102 95 103  Resp: 17 18 16 16   Temp: 98.4 F (36.9 C) 98.3 F (36.8 C) 98.5 F (36.9 C) 99.3 F (37.4 C)  TempSrc: Oral Oral Oral Oral  SpO2: 95% 100% 100% 96%  Weight:      Height:        Intake/Output  Summary (Last 24 hours) at 01/30/2020 1408 Last data filed at 01/30/2020 0900 Gross per 24 hour  Intake 600 ml  Output 650 ml  Net -50 ml   Filed Weights   01/24/20 1712 01/26/20 0500 01/28/20 0500  Weight: 73.5 kg 73.6 kg 70.5 kg    Examination:  General exam: Appears calm and comfortable  Respiratory system: Clear to auscultation. Respiratory effort normal. Cardiovascular system: S1 & S2 heard,tachycardic.No JVD,  No  pedal edema. Gastrointestinal system: Abdomen is nondistended, soft and nontender. Normal bowel sounds heard. Central nervous system: Alert and oriented. No focal neurological deficits. Extremities: Symmetric 5 x 5 power. Skin: No rashes, lesions or ulcers Psychiatry:  Mood & affect appropriate.     Data Reviewed: I have personally reviewed following labs and imaging studies  CBC: Recent Labs  Lab 01/24/20 0111 01/24/20 0111 01/25/20 0230 01/25/20 0948 01/26/20 0633 01/27/20 0622 01/28/20 0522  WBC 8.5   < > 4.5 4.7 4.8 4.9 5.1  NEUTROABS 5.4  --   --   --   --   --   --   HGB 14.4   < > 10.9* 11.1* 11.5* 12.0 12.2  HCT 43.0   < > 34.6* 34.2* 34.5* 36.5 38.0  MCV 100.9*   < > 106.1* 104.9* 102.1* 102.2* 103.0*  PLT 207   < > 142* 145* 142* 168 220   < > = values in this interval not displayed.    Basic Metabolic Panel: Recent Labs  Lab 01/24/20 0510 01/24/20 1452 01/25/20 0230 01/25/20 0230 01/25/20 [id number] 01/25/20 0948 01/26/20 [id number] 01/27/20 0622 01/28/20 0522 01/29/20 0522 01/30/20 0524  NA  --    < > 135   < > 136   < > 136 134* 133* 136 137  K  --    < > 3.5   < > 3.6   < > 3.2* 3.5 3.9 3.5 3.7  CL  --    < > 104   < > 105   < > 105 99 98 101 101  CO2  --    < > 21*   < > 20*   < > 23 24 24 25 26   GLUCOSE  --    < > 95   < > 125*   < > 124* 93 95 90 98  BUN  --    < > 10   < > 10   < > 7* 9 9 10 9   CREATININE  --    < > 0.46   < > 0.43*   < > 0.42* 0.35* 0.44 0.38* 0.40*  CALCIUM  --    < > 8.7*   < > 8.5*   < > 8.8* 9.3 9.5 9.5 9.5  MG 2.4  --  2.2  --  2.0  --  2.0 1.9  --   --   --   PHOS  --   --  2.1*  --  1.8*  --  2.5  --   --   --   --    < > = values in this interval not displayed.    GFR: Estimated Creatinine Clearance: 64.5 mL/min (A) (by C-G formula based on SCr of 0.4 mg/dL (L)).  Liver Function Tests: Recent Labs  Lab 01/25/20 0948 01/26/20 0633 01/28/20 0522 01/29/20 0522 01/30/20 0524  AST 87* 67* 34 29 28  ALT 68* 63* 50* 48* 47*   ALKPHOS 112 111 126 122 132*  BILITOT 1.0  1.0 0.6 1.0 0.7  PROT 6.4* 6.4* 6.8 6.8 7.0  ALBUMIN 3.8 3.7 3.7 4.0 3.9    CBG: Recent Labs  Lab 01/29/20 2024 01/30/20 0150 01/30/20 0435 01/30/20 0743 01/30/20 1209  GLUCAP 109* 112* 106* 111* 143*     Recent Results (from the past 240 hour(s))  SARS Coronavirus 2 by RT PCR (hospital order, performed in [facility] hospital lab) Nasopharyngeal Nasopharyngeal Swab     Status: None   Collection Time: 01/24/20  1:08 AM   Specimen: Nasopharyngeal Swab  Result Value Ref Range Status   SARS Coronavirus 2 NEGATIVE NEGATIVE Final    Comment: (NOTE) SARS-CoV-2 target nucleic acids are NOT DETECTED.  The SARS-CoV-2 RNA is generally detectable in upper and lower respiratory specimens during the acute phase of infection. The lowest concentration of SARS-CoV-2 viral copies this assay can detect is 250 copies / mL. A negative result does not preclude SARS-CoV-2 infection and should not be used as the sole basis for treatment or other patient management decisions.  A negative result may occur with improper specimen collection / handling, submission of specimen other than nasopharyngeal swab, presence of viral mutation(s) within the areas targeted by this assay, and inadequate number of viral copies (<250 copies / mL). A negative result must be combined with clinical observations, patient history, and epidemiological information.  Fact Sheet for Patients:   BoilerBrush.com.cy  Fact Sheet for Healthcare Providers: https://pope.com/  This test is not yet approved or  cleared by the [place] FDA and has been authorized for detection and/or diagnosis of SARS-CoV-2 by FDA under an Emergency Use Authorization (EUA).  This EUA will remain in effect (meaning this test can be used) for the duration of the COVID-19 declaration under Section 564(b)(1) of the Act, 21 U.S.C. section 360bbb-3(b)(1),  unless the authorization is terminated or revoked sooner.  Performed at [facility], [address], [place], [place] [address]   MRSA PCR Screening     Status: None   Collection Time: 01/24/20  5:01 PM   Specimen: Nasal Mucosa; Nasopharyngeal  Result Value Ref Range Status   MRSA by PCR NEGATIVE NEGATIVE Final    Comment:        The GeneXpert MRSA Assay (FDA approved for NASAL specimens only), is one component of a comprehensive MRSA colonization surveillance program. It is not intended to diagnose MRSA infection nor to guide or monitor treatment for MRSA infections. Performed at [facility], [address], [place], [place] [address]          Radiology Studies: No results found.      Scheduled Meds: . FLUoxetine  40 mg Oral q morning - 10a  . folic acid  1 mg Oral Daily  . heparin  5,000 Units Subcutaneous Q8H  . insulin aspart  0-9 Units Subcutaneous Q4H  . levETIRAcetam  1,000 mg Oral BID  . losartan  50 mg Oral Daily  . multivitamin with minerals  1 tablet Oral Daily  . pantoprazole  40 mg Oral Daily  . thiamine  100 mg Oral Daily   Or  . thiamine  100 mg Intravenous Daily   Continuous Infusions:   LOS: 6 days        Kathlen Mody, MD [facility]   To contact the attending provider between 7A-7P or the covering provider during after hours 7P-7A, please log into the web site [email or web address] and access using universal [place] password for that web site. If you do  not have the password, please call the hospital operator.  01/30/2020, 2:08 PM

## 2020-01-31 LAB — GLUCOSE, CAPILLARY
Glucose-Capillary: 101 mg/dL — ABNORMAL HIGH (ref 70–99)
Glucose-Capillary: 101 mg/dL — ABNORMAL HIGH (ref 70–99)
Glucose-Capillary: 120 mg/dL — ABNORMAL HIGH (ref 70–99)
Glucose-Capillary: 122 mg/dL — ABNORMAL HIGH (ref 70–99)
Glucose-Capillary: 126 mg/dL — ABNORMAL HIGH (ref 70–99)
Glucose-Capillary: 129 mg/dL — ABNORMAL HIGH (ref 70–99)
Glucose-Capillary: 133 mg/dL — ABNORMAL HIGH (ref 70–99)

## 2020-01-31 NOTE — Progress Notes (Signed)
PROGRESS NOTE    Ashley Bradley  DUK:[id number] DOB: 1951-06-22 DOA: 01/24/2020 PCP: Sigmund Hazel, MD    Chief Complaint  Patient presents with  . Seizures    Brief Narrative: Ashley Bradley is a 69 year old female with medical history significant for hypertension, prior MRSA bacteremia (2019), alcohol abuse, cerebral palsy with right-sided residual weakness who presented on 7/23 with a desire to stop drinking alcohol and had a witnessed convulsive type movements concerning for alcohol withdrawal seizure in the [facility] long emergency room. Patient was given Ativan and Keppra and started on Precedex drip for presumed alcohol withdrawal with critical care management. During hospital course she was found to have hyponatremia, increased anion gap metabolic acidosis and transaminitis presume related to alcohol abuse. Patient also found to have ammonia level of 102 prompting lactulose therapy and improvement in mental status and resolution of ammonia 22. Patient was able to transition off of Precedex within 24 hours and with resolution of delirium tremens transferred to progressive unit under TRH care on 7/25. No seizure activity.  PT eval recommending SNF.   Assessment & Plan:   Active Problems:   DTs (delirium tremens) (HCC)   Alcohol withdrawal seizure (HCC)   Acute metabolic encephalopathy Multifactorial secondary to alcohol withdrawal/seizures and hepatic encephalopathy. Resolved patient is currently alert and oriented without any signs of withdrawals.  Ammonia level normalized.  Continue to monitor on CIWA protocol and as needed Ativan.   Witnessed seizure-like activity No recurrent episodes occurred in the setting of alcohol withdrawal. History of cerebral palsy and prior seizure episode as a kid. Patient was started on Keppra, continue the same on discharge and recommend outpatient referral for neurology.    Alcohol abuse admits to using alcohol to deal with life stressors TOC  consulted for substance abuse counseling, outpatient resources given patient familiar with AA program Continue with folic acid, thiamine and multivitamin.   Mild alcoholic hepatitis Improving.  Recommend outpatient follow-up of the liver enzymes in about 4 to 6 weeks.    Hypokalemia Replaced.   Asymptomatic SVT Probably secondary to hypokalemia.    Hyponatremia.  Probably secondary to alcohol abuse Resolved with hydration.   Cerebral palsy PT evaluation recommending SNF.    Essential hypertension Well-controlled blood pressure parameters, resume home medications   GERD Stable.    Mood disorder Continue with Prozac.   DVT prophylaxis:  Code Status: FULL CODE.  Family Communication: none at bedside.  Disposition:   Status is: Inpatient  Remains inpatient appropriate because:Unsafe d/c plan   Dispo: The patient is from: Home              Anticipated d/c is to: SNF              Anticipated d/c date is: 1 day              Patient currently is medically stable to d/c.       Consultants:   None.    Procedures:none.   Antimicrobials: none.  Subjective:  No new complaints.   Objective: Vitals:   01/30/20 1252 01/30/20 2033 01/31/20 0613 01/31/20 1540  BP: 127/72 123/71 (!) 131/64 119/65  Pulse: 103 88 101 95  Resp: 16 21 18 18   Temp: 99.3 F (37.4 C) 98.4 F (36.9 C) 98.9 F (37.2 C) 98.2 F (36.8 C)  TempSrc: Oral Oral Oral Oral  SpO2: 96% 94% 93% 96%  Weight:      Height:        Intake/Output Summary (Last  24 hours) at 01/31/2020 1805 Last data filed at 01/30/2020 2100 Gross per 24 hour  Intake 440 ml  Output 200 ml  Net 240 ml   Filed Weights   01/24/20 1712 01/26/20 0500 01/28/20 0500  Weight: 73.5 kg 73.6 kg 70.5 kg    Examination:  General exam: Appears calm and comfortable  Respiratory system: Clear to auscultation. Respiratory effort normal. Cardiovascular system: S1 & S2 heard,tachycardic.No JVD,  No pedal  edema. Gastrointestinal system: Abdomen is nondistended, soft and nontender. Normal bowel sounds heard. Central nervous system: Alert and oriented. No focal neurological deficits. Extremities: Symmetric 5 x 5 power. Skin: No rashes, lesions or ulcers Psychiatry:  Mood & affect appropriate.     Data Reviewed: I have personally reviewed following labs and imaging studies  CBC: Recent Labs  Lab 01/25/20 0230 01/25/20 0948 01/26/20 0633 01/27/20 0622 01/28/20 0522  WBC 4.5 4.7 4.8 4.9 5.1  HGB 10.9* 11.1* 11.5* 12.0 12.2  HCT 34.6* 34.2* 34.5* 36.5 38.0  MCV 106.1* 104.9* 102.1* 102.2* 103.0*  PLT 142* 145* 142* 168 220    Basic Metabolic Panel: Recent Labs  Lab 01/25/20 0230 01/25/20 0230 01/25/20 0948 01/25/20 0948 01/26/20 4650 01/27/20 0622 01/28/20 0522 01/29/20 0522 01/30/20 0524  NA 135   < > 136   < > 136 134* 133* 136 137  K 3.5   < > 3.6   < > 3.2* 3.5 3.9 3.5 3.7  CL 104   < > 105   < > 105 99 98 101 101  CO2 21*   < > 20*   < > 23 24 24 25 26   GLUCOSE 95   < > 125*   < > 124* 93 95 90 98  BUN 10   < > 10   < > 7* 9 9 10 9   CREATININE 0.46   < > 0.43*   < > 0.42* 0.35* 0.44 0.38* 0.40*  CALCIUM 8.7*   < > 8.5*   < > 8.8* 9.3 9.5 9.5 9.5  MG 2.2  --  2.0  --  2.0 1.9  --   --   --   PHOS 2.1*  --  1.8*  --  2.5  --   --   --   --    < > = values in this interval not displayed.    GFR: Estimated Creatinine Clearance: 64.5 mL/min (A) (by C-G formula based on SCr of 0.4 mg/dL (L)).  Liver Function Tests: Recent Labs  Lab 01/25/20 0948 01/26/20 0633 01/28/20 0522 01/29/20 0522 01/30/20 0524  AST 87* 67* 34 29 28  ALT 68* 63* 50* 48* 47*  ALKPHOS 112 111 126 122 132*  BILITOT 1.0 1.0 0.6 1.0 0.7  PROT 6.4* 6.4* 6.8 6.8 7.0  ALBUMIN 3.8 3.7 3.7 4.0 3.9    CBG: Recent Labs  Lab 01/31/20 0020 01/31/20 0435 01/31/20 0718 01/31/20 1155 01/31/20 1653  GLUCAP 101* 126* 120* 101* 129*     Recent Results (from the past 240 hour(s))  SARS  Coronavirus 2 by RT PCR (hospital order, performed in [facility] hospital lab) Nasopharyngeal Nasopharyngeal Swab     Status: None   Collection Time: 01/24/20  1:08 AM   Specimen: Nasopharyngeal Swab  Result Value Ref Range Status   SARS Coronavirus 2 NEGATIVE NEGATIVE Final    Comment: (NOTE) SARS-CoV-2 target nucleic acids are NOT DETECTED.  The SARS-CoV-2 RNA is generally detectable in upper and lower respiratory specimens during the acute  phase of infection. The lowest concentration of SARS-CoV-2 viral copies this assay can detect is 250 copies / mL. A negative result does not preclude SARS-CoV-2 infection and should not be used as the sole basis for treatment or other patient management decisions.  A negative result may occur with improper specimen collection / handling, submission of specimen other than nasopharyngeal swab, presence of viral mutation(s) within the areas targeted by this assay, and inadequate number of viral copies (<250 copies / mL). A negative result must be combined with clinical observations, patient history, and epidemiological information.  Fact Sheet for Patients:   BoilerBrush.com.cy  Fact Sheet for Healthcare Providers: https://pope.com/  This test is not yet approved or  cleared by the [place] FDA and has been authorized for detection and/or diagnosis of SARS-CoV-2 by FDA under an Emergency Use Authorization (EUA).  This EUA will remain in effect (meaning this test can be used) for the duration of the COVID-19 declaration under Section 564(b)(1) of the Act, 21 U.S.C. section 360bbb-3(b)(1), unless the authorization is terminated or revoked sooner.  Performed at [facility], [address] [place], [place] [address]   MRSA PCR Screening     Status: None   Collection Time: 01/24/20  5:01 PM   Specimen: Nasal Mucosa; Nasopharyngeal  Result Value Ref Range Status   MRSA by  PCR NEGATIVE NEGATIVE Final    Comment:        The GeneXpert MRSA Assay (FDA approved for NASAL specimens only), is one component of a comprehensive MRSA colonization surveillance program. It is not intended to diagnose MRSA infection nor to guide or monitor treatment for MRSA infections. Performed at [facility], [address], [place], [place] [address]          Radiology Studies: No results found.      Scheduled Meds: . FLUoxetine  40 mg Oral q morning - 10a  . folic acid  1 mg Oral Daily  . heparin  5,000 Units Subcutaneous Q8H  . insulin aspart  0-9 Units Subcutaneous Q4H  . levETIRAcetam  1,000 mg Oral BID  . losartan  50 mg Oral Daily  . multivitamin with minerals  1 tablet Oral Daily  . pantoprazole  40 mg Oral Daily  . thiamine  100 mg Oral Daily   Or  . thiamine  100 mg Intravenous Daily   Continuous Infusions:   LOS: 7 days        Kathlen Mody, MD [facility]   To contact the attending provider between 7A-7P or the covering provider during after hours 7P-7A, please log into the web site [email or web address] and access using universal [place] password for that web site. If you do not have the password, please call the hospital operator.  01/31/2020, 6:05 PM

## 2020-01-31 NOTE — TOC Progression Note (Signed)
Transition of Care [facility]) - Progression Note    Patient Details  Name: Ashley Bradley MRN: [id number] Date of Birth: 1951-03-08  Transition of [facility]) CM/SW Contact  Sarahann Horrell, [name], RN Phone Number: 01/31/2020, 10:31 AM  Clinical Narrative: [facility] insurance auth intiiated [phone] chosen-no bed today likely avail  Monday-rep Tammy following,per patient vaccine pfizer Jan,& Feb 2021,await covid to be ordered,& auth.     Expected Discharge Plan: Skilled Nursing Facility Barriers to Discharge:  (insurance auth;[facility] chosen no bed today likely monday.)  Expected Discharge Plan and Services Expected Discharge Plan: Skilled Nursing Facility         Expected Discharge Date: 01/29/20                                     Social Determinants of Health (SDOH) Interventions    Readmission Risk Interventions No flowsheet data found.

## 2020-01-31 NOTE — NC FL2 (Signed)
[place] MEDICAID [id number] LEVEL OF CARE SCREENING TOOL     IDENTIFICATION  Patient Name: Ashley Bradley Birthdate: June 30, 1951 Sex: female Admission Date (Current Location): 01/24/2020  [facility] and [place] Number:  Producer, television/film/video and Address:  [facility],  [address], [place] [address]      Provider Number: [id number]  Attending Physician Name and Address:  Kathlen Mody, MD  Relative Name and Phone Number:  Jalyah Weinheimer spouse [phone]    Current Level of Care: Hospital Recommended Level of Care: Skilled Nursing Facility Prior Approval Number:    Date Approved/Denied:   PASRR Number: [id number]  Discharge Plan: SNF    Current Diagnoses: Patient Active Problem List   Diagnosis Date Noted  . DTs (delirium tremens) (HCC) 01/24/2020  . Alcohol withdrawal seizure (HCC) 01/24/2020  . High anion gap metabolic acidosis 10/13/2017  . Melena 06/29/2016  . Syncope 01/26/2016  . Prolonged Q-T interval on ECG 01/26/2016  . History of Clostridium difficile colitis 01/26/2016  . Elevated troponin   . Alcohol use disorder, moderate, in early remission, dependence (HCC) 01/21/2016  . AKI (acute kidney injury) (HCC) 01/16/2016  . Seizures (HCC) 09/24/2013  . Hyponatremia 09/20/2013  . Major depression 03/19/2013  . Alcohol intoxication in relapsed alcoholic (HCC) 03/19/2013    Orientation RESPIRATION BLADDER Height & Weight     Self, Time, Place  Normal Continent Weight: 70.5 kg Height:  5\' 5"  (165.1 cm)  BEHAVIORAL SYMPTOMS/MOOD NEUROLOGICAL BOWEL NUTRITION STATUS    Convulsions/Seizures Continent Diet (Regular)  AMBULATORY STATUS COMMUNICATION OF NEEDS Skin   Limited Assist Verbally Normal                       Personal Care Assistance Level of Assistance  Bathing, Feeding, Dressing Bathing Assistance: Limited assistance Feeding assistance: Limited assistance Dressing Assistance: Limited assistance     Functional Limitations Info   Sight, Hearing, Speech Sight Info: Adequate Hearing Info: Adequate Speech Info: Adequate    SPECIAL CARE FACTORS FREQUENCY  PT (By licensed PT), OT (By licensed OT)     PT Frequency: 5x week OT Frequency: 5x week            Contractures Contractures Info: Not present    Additional Factors Info  Code Status, Psychotropic Code Status Info: Full   Psychotropic Info: Cerebral Palsy;Alcohol use;ativan;see MAR         Current Medications (01/31/2020):  This is the current hospital active medication list Current Facility-Administered Medications  Medication Dose Route Frequency Provider Last Rate Last Admin  . docusate sodium (COLACE) capsule 100 mg  100 mg Oral BID PRN 02/02/2020 B, NP      . FLUoxetine (PROZAC) capsule 40 mg  40 mg Oral q morning - 10a Selmer Dominion D, MD   40 mg at 01/31/20 02/02/20  . folic acid (FOLVITE) tablet 1 mg  1 mg Oral Daily Mannam, Praveen, MD   1 mg at 01/31/20 0928  . heparin injection 5,000 Units  5,000 Units Subcutaneous Q8H 02/02/20 B, NP   5,000 Units at 01/31/20 0636  . insulin aspart (novoLOG) injection 0-9 Units  0-9 Units Subcutaneous Q4H 02/02/20, MD   1 Units at 01/31/20 [phone]  . levETIRAcetam (KEPPRA) tablet 1,000 mg  1,000 mg Oral BID 0383 D, MD   1,000 mg at 01/31/20 02/02/20  . LORazepam (ATIVAN) injection 1-2 mg  1-2 mg Intravenous Q1H PRN 3383, NP  1 mg at 01/25/20 0107  . losartan (COZAAR) tablet 50 mg  50 mg Oral Daily Roberto Scales D, MD   50 mg at 01/31/20 4401  . multivitamin with minerals tablet 1 tablet  1 tablet Oral Daily Mannam, Praveen, MD   1 tablet at 01/31/20 0928  . pantoprazole (PROTONIX) EC tablet 40 mg  40 mg Oral Daily Roberto Scales D, MD   40 mg at 01/31/20 0272  . polyethylene glycol (MIRALAX / GLYCOLAX) packet 17 g  17 g Oral Daily PRN Selmer Dominion B, NP      . thiamine tablet 100 mg  100 mg Oral Daily Mannam, Praveen, MD   100 mg at 01/31/20 5366   Or  . thiamine (B-1)  injection 100 mg  100 mg Intravenous Daily Mannam, Praveen, MD      . traMADol (ULTRAM) tablet 50 mg  50 mg Oral Q6H PRN Roberto Scales D, MD   50 mg at 01/29/20 4403     Discharge Medications: Please see discharge summary for a list of discharge medications.  Relevant Imaging Results:  Relevant Lab Results:   Additional Information SS#[phone], [name], [place]

## 2020-01-31 NOTE — Care Management Important Message (Signed)
Important Message  Patient Details IM Letter given to the Patient Name: TEREASE MARCOTTE MRN: [id number] Date of Birth: [birth year]   Medicare Important Message Given:  Yes     [name] 01/31/2020, 1:23 PM

## 2020-01-31 NOTE — Progress Notes (Signed)
Physical Therapy Treatment Patient Details Name: Ashley Bradley MRN: [id number] DOB: 1950-08-28 Today's Date: 01/31/2020    History of Present Illness 69 year old female with medical history significant for hypertension, prior MRSA bacteremia (2019), alcohol abuse, cerebral palsy with right-sided residual weakness who presented on 7/23 with a desire to stop drinking alcohol and had a witnessed convulsive type movements concerning for alcohol withdrawal seizure in the [facility] long emergency room.  Patient was given Ativan and Keppra and started on Precedex drip for presumed alcohol withdrawal with critical care management.    PT Comments    Pt in bed AxO x 3 very pleasant retired Engineer, civil (consulting).  General transfer comment: attempted sit to stand with walker, pt was unable to clear hips off bed and c/o R LE "won't hold me" so then + 2 side by side assist to [facility] with partial turn completion.  Great difficulty weight shifting onto R LE and present with poor posture.  Pt sat on BSC an extended time for a BM.  Then + 2 side by side assist back to bed. General Gait Details: unable to attempt due to poor transfer ability Pt present with alcohol withdrawal paresis  Pt lives home with spouse and was able to amb functionally.  Pt will need ST Rehab at SNF  Follow Up Recommendations  SNF     Equipment Recommendations       Recommendations for Other Services       Precautions / Restrictions Precautions Precautions: Fall Precaution Comments: Hx CP R side effected Restrictions Weight Bearing Restrictions: No    Mobility  Bed Mobility Overal bed mobility: Needs Assistance Bed Mobility: Supine to Sit     Supine to sit: Min guard     General bed mobility comments: with HOB elevated and increased time was able to partially transition to EOB then therapist used bed pad to complete scooting  Transfers Overall transfer level: Needs assistance Equipment used: 2 person hand held assist Transfers: Stand  Pivot Transfers   Stand pivot transfers: +2 physical assistance;+2 safety/equipment;Max assist       General transfer comment: attempted sit to stand with walker, pt was unable to clear hips off bed and c/o R LE "won't hold me" so then + 2 side by side assist to [facility] with partial turn completion.  Great difficulty weight shifting onto R LE and present with poor posture.  Pt sat on BSC an extended time for a BM.  Then + 2 side by side assist back to bed.  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  General Comments        Pertinent Vitals/Pain      Home Living                      Prior Function            PT Goals (current goals can now be found in the care plan section) Progress towards PT goals: Progressing toward goals    Frequency    Min 3X/week      PT Plan Current plan remains appropriate    Co-evaluation              AM-PAC PT "6 Clicks" Mobility   Outcome Measure  Help needed turning from your back to your side while in a flat bed without using bedrails?: A Lot Help needed moving from lying on your back to sitting on the side of a flat bed without using bedrails?: A Lot Help needed moving to and from a bed to a chair (including a wheelchair)?: A Lot Help needed standing up from a chair using your arms (e.g., wheelchair or bedside chair)?: A Lot Help needed to walk in hospital room?: Total Help needed climbing 3-5 steps with a  railing? : Total 6 Click Score: 10    End of Session Equipment Utilized During Treatment: Gait belt Activity Tolerance: Patient tolerated treatment well Patient left: in bed;with call bell/phone within reach;with bed alarm set Nurse Communication: Mobility status       Time:  14:30 -1520    Charges:  $Therapeutic Activity: 38-52 mins                     Felecia Shelling  PTA Acute  Rehabilitation Services Pager      [phone] Office      [phone]

## 2020-02-01 LAB — GLUCOSE, CAPILLARY
Glucose-Capillary: 101 mg/dL — ABNORMAL HIGH (ref 70–99)
Glucose-Capillary: 92 mg/dL (ref 70–99)
Glucose-Capillary: 112 mg/dL — ABNORMAL HIGH (ref 70–99)
Glucose-Capillary: 92 mg/dL (ref 70–99)
Glucose-Capillary: 114 mg/dL — ABNORMAL HIGH (ref 70–99)

## 2020-02-01 NOTE — Progress Notes (Signed)
PROGRESS NOTE    Ashley Bradley  OEU:[id number] DOB: 1951-03-02 DOA: 01/24/2020 PCP: Sigmund Hazel, MD    Chief Complaint  Patient presents with  . Seizures    Brief Narrative: Ashley Bradley is a 69 year old female with medical history significant for hypertension, prior MRSA bacteremia (2019), alcohol abuse, cerebral palsy with right-sided residual weakness who presented on 7/23 with a desire to stop drinking alcohol and had a witnessed convulsive type movements concerning for alcohol withdrawal seizure in the [facility] long emergency room. Patient was given Ativan and Keppra and started on Precedex drip for presumed alcohol withdrawal with critical care management. During hospital course she was found to have hyponatremia, increased anion gap metabolic acidosis and transaminitis presume related to alcohol abuse. Patient also found to have ammonia level of 102 prompting lactulose therapy and improvement in mental status and resolution of ammonia 22. Patient was able to transition off of Precedex within 24 hours and with resolution of delirium tremens transferred to progressive unit under TRH care on 7/25. No seizure activity.  PT eval recommending SNF.  No new complaints.   Assessment & Plan:   Active Problems:   DTs (delirium tremens) (HCC)   Alcohol withdrawal seizure (HCC)   Acute metabolic encephalopathy Multifactorial secondary to alcohol withdrawal/seizures and hepatic encephalopathy. Resolved patient is currently alert and oriented without any signs of withdrawals.   Ammonia level normalized.   Monitor on CIWA.    Witnessed seizure-like activity No recurrent episodes occurred in the setting of alcohol withdrawal. History of cerebral palsy and prior seizure episode as a kid. Patient was started on Keppra, continue the same on discharge and recommend outpatient AMBULATORY referral for neurology.    Alcohol abuse admits to using alcohol to deal with life stressors TOC  consulted for substance abuse counseling, outpatient resources given patient familiar with AA program Continue with folic acid, thiamine and multivitamin. No withdrawal symptoms.    Mild alcoholic hepatitis Improving.  Recommend outpatient follow-up of the liver enzymes in about 4 to 6 weeks.    Hypokalemia Replaced. No new labs today.    Asymptomatic SVT Probably secondary to hypokalemia.    Hyponatremia.  Probably secondary to alcohol abuse Resolved with hydration.   Cerebral palsy PT evaluation recommending SNF.    Essential hypertension Well-controlled blood pressure parameters, resume home medications   GERD Stable.    Mood disorder Continue with Prozac.   DVT prophylaxis:  Code Status: FULL CODE.  Family Communication: none at bedside.  Disposition:   Status is: Inpatient  Remains inpatient appropriate because:Unsafe d/c plan   Dispo: The patient is from: Home              Anticipated d/c is to: SNF              Anticipated d/c date is: 1 day              Patient currently is medically stable to d/c.       Consultants:   None.    Procedures:none.   Antimicrobials: none.  Subjective:  No new complaints.   Objective: Vitals:   01/31/20 0613 01/31/20 1540 01/31/20 2016 02/01/20 0542  BP: (!) 131/64 119/65 118/66 (!) 129/76  Pulse: 101 95 100 86  Resp: 18 18 18 18   Temp: 98.9 F (37.2 C) 98.2 F (36.8 C) 98.2 F (36.8 C) 98.4 F (36.9 C)  TempSrc: Oral Oral Oral Oral  SpO2: 93% 96% 95% 98%  Weight:  Height:        Intake/Output Summary (Last 24 hours) at 02/01/2020 1100 Last data filed at 02/01/2020 0847 Gross per 24 hour  Intake 240 ml  Output --  Net 240 ml   Filed Weights   01/24/20 1712 01/26/20 0500 01/28/20 0500  Weight: 73.5 kg 73.6 kg 70.5 kg    Examination:  General exam: Alert and comfortable Respiratory system: Clear to auscultation bilaterally, no wheezing or rhonchi Cardiovascular system: S1-S2  heard, regular rate rhythm, no JVD Gastrointestinal system: Abdomen is soft, nontender, nondistended, bowel sounds normal Central nervous system: Alert and oriented Extremities: No cyanosis or clubbing Skin: No rashes seen Psychiatry: Is appropriate    Data Reviewed: I have personally reviewed following labs and imaging studies  CBC: Recent Labs  Lab 01/26/20 0633 01/27/20 0622 01/28/20 0522  WBC 4.8 4.9 5.1  HGB 11.5* 12.0 12.2  HCT 34.5* 36.5 38.0  MCV 102.1* 102.2* 103.0*  PLT 142* 168 220    Basic Metabolic Panel: Recent Labs  Lab 01/26/20 0633 01/27/20 0622 01/28/20 0522 01/29/20 0522 01/30/20 0524  NA 136 134* 133* 136 137  K 3.2* 3.5 3.9 3.5 3.7  CL 105 99 98 101 101  CO2 23 24 24 25 26   GLUCOSE 124* 93 95 90 98  BUN 7* 9 9 10 9   CREATININE 0.42* 0.35* 0.44 0.38* 0.40*  CALCIUM 8.8* 9.3 9.5 9.5 9.5  MG 2.0 1.9  --   --   --   PHOS 2.5  --   --   --   --     GFR: Estimated Creatinine Clearance: 64.5 mL/min (A) (by C-G formula based on SCr of 0.4 mg/dL (L)).  Liver Function Tests: Recent Labs  Lab 01/26/20 0633 01/28/20 0522 01/29/20 0522 01/30/20 0524  AST 67* 34 29 28  ALT 63* 50* 48* 47*  ALKPHOS 111 126 122 132*  BILITOT 1.0 0.6 1.0 0.7  PROT 6.4* 6.8 6.8 7.0  ALBUMIN 3.7 3.7 4.0 3.9    CBG: Recent Labs  Lab 01/31/20 1653 01/31/20 2014 01/31/20 2349 02/01/20 0408 02/01/20 0737  GLUCAP 129* 133* 122* 101* 92     Recent Results (from the past 240 hour(s))  SARS Coronavirus 2 by RT PCR (hospital order, performed in [facility] hospital lab) Nasopharyngeal Nasopharyngeal Swab     Status: None   Collection Time: 01/24/20  1:08 AM   Specimen: Nasopharyngeal Swab  Result Value Ref Range Status   SARS Coronavirus 2 NEGATIVE NEGATIVE Final    Comment: (NOTE) SARS-CoV-2 target nucleic acids are NOT DETECTED.  The SARS-CoV-2 RNA is generally detectable in upper and lower respiratory specimens during the acute phase of infection. The  lowest concentration of SARS-CoV-2 viral copies this assay can detect is 250 copies / mL. A negative result does not preclude SARS-CoV-2 infection and should not be used as the sole basis for treatment or other patient management decisions.  A negative result may occur with improper specimen collection / handling, submission of specimen other than nasopharyngeal swab, presence of viral mutation(s) within the areas targeted by this assay, and inadequate number of viral copies (<250 copies / mL). A negative result must be combined with clinical observations, patient history, and epidemiological information.  Fact Sheet for Patients:   [facility]  Fact Sheet for Healthcare Providers: 01/26/20  This test is not yet approved or  cleared by the [employer] FDA and has been authorized for detection and/or diagnosis of SARS-CoV-2 by FDA under an Emergency  Use Authorization (EUA).  This EUA will remain in effect (meaning this test can be used) for the duration of the COVID-19 declaration under Section 564(b)(1) of the Act, 21 U.S.C. section 360bbb-3(b)(1), unless the authorization is terminated or revoked sooner.  Performed at [facility], [address], [place], [place] [address]   MRSA PCR Screening     Status: None   Collection Time: 01/24/20  5:01 PM   Specimen: Nasal Mucosa; Nasopharyngeal  Result Value Ref Range Status   MRSA by PCR NEGATIVE NEGATIVE Final    Comment:        The GeneXpert MRSA Assay (FDA approved for NASAL specimens only), is one component of a comprehensive MRSA colonization surveillance program. It is not intended to diagnose MRSA infection nor to guide or monitor treatment for MRSA infections. Performed at [facility], [address] [place], [place] [address]          Radiology Studies: No results found.      Scheduled Meds: .  FLUoxetine  40 mg Oral q morning - 10a  . folic acid  1 mg Oral Daily  . heparin  5,000 Units Subcutaneous Q8H  . insulin aspart  0-9 Units Subcutaneous Q4H  . levETIRAcetam  1,000 mg Oral BID  . losartan  50 mg Oral Daily  . multivitamin with minerals  1 tablet Oral Daily  . pantoprazole  40 mg Oral Daily  . thiamine  100 mg Oral Daily   Or  . thiamine  100 mg Intravenous Daily   Continuous Infusions:   LOS: 8 days        Kathlen Mody, MD [facility]   To contact the attending provider between 7A-7P or the covering provider during after hours 7P-7A, please log into the web site [email or web address] and access using universal [place] password for that web site. If you do not have the password, please call the hospital operator.  02/01/2020, 11:00 AM

## 2020-02-01 NOTE — Plan of Care (Signed)

## 2020-02-02 LAB — BASIC METABOLIC PANEL
Anion gap: 10 (ref 5–15)
BUN: 11 mg/dL (ref 8–23)
CO2: 28 mmol/L (ref 22–32)
Calcium: 9.6 mg/dL (ref 8.9–10.3)
Chloride: 100 mmol/L (ref 98–111)
Creatinine, Ser: 0.62 mg/dL (ref 0.44–1.00)
GFR calc Af Amer: >60 mL/min (ref >60)
GFR calc non Af Amer: >60 mL/min (ref >60)
Glucose, Bld: 115 mg/dL — ABNORMAL HIGH (ref 70–99)
Potassium: 4.2 mmol/L (ref 3.5–5.1)
Sodium: 138 mmol/L (ref 135–145)

## 2020-02-02 LAB — CBC
HCT: 36.4 % (ref 36.0–46.0)
Hemoglobin: 11.9 g/dL — ABNORMAL LOW (ref 12.0–15.0)
MCH: 33.5 pg (ref 26.0–34.0)
MCHC: 32.7 g/dL (ref 30.0–36.0)
MCV: 102.5 fL — ABNORMAL HIGH (ref 80.0–100.0)
Platelets: 430 10*3/uL — ABNORMAL HIGH (ref 150–400)
RBC: 3.55 MIL/uL — ABNORMAL LOW (ref 3.87–5.11)
RDW: 13.3 % (ref 11.5–15.5)
WBC: 5.8 10*3/uL (ref 4.0–10.5)
nRBC: 0 % (ref 0.0–0.2)

## 2020-02-02 LAB — GLUCOSE, CAPILLARY
Glucose-Capillary: 102 mg/dL — ABNORMAL HIGH (ref 70–99)
Glucose-Capillary: 102 mg/dL — ABNORMAL HIGH (ref 70–99)
Glucose-Capillary: 107 mg/dL — ABNORMAL HIGH (ref 70–99)
Glucose-Capillary: 122 mg/dL — ABNORMAL HIGH (ref 70–99)
Glucose-Capillary: 82 mg/dL (ref 70–99)
Glucose-Capillary: 83 mg/dL (ref 70–99)
Glucose-Capillary: 95 mg/dL (ref 70–99)

## 2020-02-02 NOTE — Progress Notes (Signed)
PROGRESS NOTE    Ashley Bradley  XIP:[id number] DOB: [birth year] DOA: 01/24/2020 PCP: Sigmund Hazel, MD    Chief Complaint  Patient presents with  . Seizures    Brief Narrative: Ashley Bradley is a 69 year old female with medical history significant for hypertension, prior MRSA bacteremia (2019), alcohol abuse, cerebral palsy with right-sided residual weakness who presented on 7/23 with a desire to stop drinking alcohol and had a witnessed convulsive type movements concerning for alcohol withdrawal seizure in the [facility] long emergency room. Patient was given Ativan and Keppra and started on Precedex drip for presumed alcohol withdrawal with critical care management. During hospital course she was found to have hyponatremia, increased anion gap metabolic acidosis and transaminitis presume related to alcohol abuse. Patient also found to have ammonia level of 102 prompting lactulose therapy and improvement in mental status and resolution of ammonia 22. Patient was able to transition off of Precedex within 24 hours and with resolution of delirium tremens transferred to progressive unit under TRH care on 7/25. No seizure activity.  PT eval recommending SNF. Pt seen and examined at bedside. No new complaints.   Assessment & Plan:   Active Problems:   DTs (delirium tremens) (HCC)   Alcohol withdrawal seizure (HCC)   Acute metabolic encephalopathy Multifactorial secondary to alcohol withdrawal/seizures and hepatic encephalopathy. Resolved patient is currently alert and oriented without any signs of withdrawals.   Ammonia level normalized.   Monitor on CIWA.  No withdrawal symptoms.    Witnessed seizure-like activity No recurrent episodes occurred in the setting of alcohol withdrawal. History of cerebral palsy and prior seizure episode as a kid. Patient was started on Keppra, continue the same on discharge and recommend outpatient AMBULATORY referral for neurology.    Alcohol abuse admits  to using alcohol to deal with life stressors TOC consulted for substance abuse counseling, outpatient resources given patient familiar with AA program Continue with folic acid, thiamine and multivitamin. No withdrawal symptoms.    Mild alcoholic hepatitis Much improved.  Recommend outpatient follow-up of the liver enzymes in about 4 to 6 weeks.    Hypokalemia Replaced. No new labs today.    Asymptomatic SVT Probably secondary to hypokalemia.    Hyponatremia.  Probably secondary to alcohol abuse Resolved with hydration.   Cerebral palsy PT evaluation recommending SNF.    Essential hypertension Well controlled. No changes in meds.    GERD Stable.    Mood disorder Continue with Prozac.   DVT prophylaxis: Heparin. Code Status: FULL CODE.  Family Communication: none at bedside.  Disposition:   Status is: Inpatient  Remains inpatient appropriate because:Unsafe d/c plan   Dispo: The patient is from: Home              Anticipated d/c is to: SNF              Anticipated d/c date is: 1 day              Patient currently is medically stable to d/c.       Consultants:   None.    Procedures:none.   Antimicrobials: none.  Subjective: No chest pain or sob, no nausea, vomiting or abd pain.   Objective: Vitals:   02/01/20 1300 02/01/20 2018 02/02/20 0539 02/02/20 1320  BP: 118/71 (!) 130/76 (!) 139/83 (!) 143/84  Pulse: 87 83 85 80  Resp: 16 18 19 17   Temp: 99.1 F (37.3 C) 98.5 F (36.9 C) 98.5 F (36.9 C) 98.8 F (37.1 C)  TempSrc: Oral Oral Oral Oral  SpO2: 97% 98% 93% 94%  Weight:      Height:        Intake/Output Summary (Last 24 hours) at 02/02/2020 1345 Last data filed at 02/02/2020 0700 Gross per 24 hour  Intake 360 ml  Output 200 ml  Net 160 ml   Filed Weights   01/24/20 1712 01/26/20 0500 01/28/20 0500  Weight: 73.5 kg 73.6 kg 70.5 kg    Examination:  General exam: Alert and comfortable.  Respiratory system: clear to  auscultation, no wheezing heard.  Cardiovascular system: S1-S2 heard, regular rate rhythm, no JVD Gastrointestinal system: Abdomen is soft, nontender, distended, bowel sounds normal Central nervous system: Alert and oriented Extremities: No cyanosis or patient Skin: No rashes seen Psychiatry: Mood is appropriate    Data Reviewed: I have personally reviewed following labs and imaging studies  CBC: Recent Labs  Lab 01/27/20 0622 01/28/20 0522  WBC 4.9 5.1  HGB 12.0 12.2  HCT 36.5 38.0  MCV 102.2* 103.0*  PLT 168 220    Basic Metabolic Panel: Recent Labs  Lab 01/27/20 0622 01/28/20 0522 01/29/20 0522 01/30/20 0524  NA 134* 133* 136 137  K 3.5 3.9 3.5 3.7  CL 99 98 101 101  CO2 24 24 25 26   GLUCOSE 93 95 90 98  BUN 9 9 10 9   CREATININE 0.35* 0.44 0.38* 0.40*  CALCIUM 9.3 9.5 9.5 9.5  MG 1.9  --   --   --     GFR: Estimated Creatinine Clearance: 64.5 mL/min (A) (by C-G formula based on SCr of 0.4 mg/dL (L)).  Liver Function Tests: Recent Labs  Lab 01/28/20 0522 01/29/20 0522 01/30/20 0524  AST 34 29 28  ALT 50* 48* 47*  ALKPHOS 126 122 132*  BILITOT 0.6 1.0 0.7  PROT 6.8 6.8 7.0  ALBUMIN 3.7 4.0 3.9    CBG: Recent Labs  Lab 02/01/20 2016 02/02/20 0006 02/02/20 0405 02/02/20 0736 02/02/20 1200  GLUCAP 114* 95 82 83 102*     Recent Results (from the past 240 hour(s))  SARS Coronavirus 2 by RT PCR (hospital order, performed in [facility] hospital lab) Nasopharyngeal Nasopharyngeal Swab     Status: None   Collection Time: 01/24/20  1:08 AM   Specimen: Nasopharyngeal Swab  Result Value Ref Range Status   SARS Coronavirus 2 NEGATIVE NEGATIVE Final    Comment: (NOTE) SARS-CoV-2 target nucleic acids are NOT DETECTED.  The SARS-CoV-2 RNA is generally detectable in upper and lower respiratory specimens during the acute phase of infection. The lowest concentration of SARS-CoV-2 viral copies this assay can detect is 250 copies / mL. A negative result  does not preclude SARS-CoV-2 infection and should not be used as the sole basis for treatment or other patient management decisions.  A negative result may occur with improper specimen collection / handling, submission of specimen other than nasopharyngeal swab, presence of viral mutation(s) within the areas targeted by this assay, and inadequate number of viral copies (<250 copies / mL). A negative result must be combined with clinical observations, patient history, and epidemiological information.  Fact Sheet for Patients:   [facility]  Fact Sheet for Healthcare Providers: 01/26/20  This test is not yet approved or  cleared by the [employer] FDA and has been authorized for detection and/or diagnosis of SARS-CoV-2 by FDA under an Emergency Use Authorization (EUA).  This EUA will remain in effect (meaning this test can be used) for the duration of the  COVID-19 declaration under Section 564(b)(1) of the Act, 21 U.S.C. section 360bbb-3(b)(1), unless the authorization is terminated or revoked sooner.  Performed at [facility], [address] [place], [place] [address]   MRSA PCR Screening     Status: None   Collection Time: 01/24/20  5:01 PM   Specimen: Nasal Mucosa; Nasopharyngeal  Result Value Ref Range Status   MRSA by PCR NEGATIVE NEGATIVE Final    Comment:        The GeneXpert MRSA Assay (FDA approved for NASAL specimens only), is one component of a comprehensive MRSA colonization surveillance program. It is not intended to diagnose MRSA infection nor to guide or monitor treatment for MRSA infections. Performed at [facility], [address], [place], [place] [address]          Radiology Studies: No results found.      Scheduled Meds: . FLUoxetine  40 mg Oral q morning - 10a  . folic acid  1 mg Oral Daily  . heparin  5,000 Units Subcutaneous Q8H  .  insulin aspart  0-9 Units Subcutaneous Q4H  . levETIRAcetam  1,000 mg Oral BID  . losartan  50 mg Oral Daily  . multivitamin with minerals  1 tablet Oral Daily  . pantoprazole  40 mg Oral Daily  . thiamine  100 mg Oral Daily   Or  . thiamine  100 mg Intravenous Daily   Continuous Infusions:   LOS: 9 days        Kathlen Mody, MD [facility]   To contact the attending provider between 7A-7P or the covering provider during after hours 7P-7A, please log into the web site [email or web address] and access using universal [place] password for that web site. If you do not have the password, please call the hospital operator.  02/02/2020, 1:45 PM

## 2020-02-03 LAB — GLUCOSE, CAPILLARY
Glucose-Capillary: 90 mg/dL (ref 70–99)
Glucose-Capillary: 92 mg/dL (ref 70–99)
Glucose-Capillary: 113 mg/dL — ABNORMAL HIGH (ref 70–99)

## 2020-02-03 LAB — SARS CORONAVIRUS 2 BY RT PCR (HOSPITAL ORDER, PERFORMED IN ~~LOC~~ HOSPITAL LAB): SARS Coronavirus 2: NEGATIVE

## 2020-02-03 NOTE — TOC Progression Note (Signed)
Transition of Care [facility]) - Progression Note    Patient Details  Name: Ashley Bradley MRN: [id number] Date of Birth: Oct 30, 1950  Transition of Care [facility]) CM/SW Contact  Armanda Heritage, RN Phone Number: 02/03/2020, 2:02 PM  Clinical Narrative:    CM confirmed bed availability at [facility] for today.  Patient will discharge to room 111P.  Insurance authorization received, reference # [id number] good from 02/03/20-02/05/20.  Patient did receive a Covid vaccine and has a copy of her card for the facility.  PTAR transportation arranged.    Expected Discharge Plan: Skilled Nursing Facility Barriers to Discharge: No Barriers Identified  Expected Discharge Plan and Services Expected Discharge Plan: Skilled Nursing Facility   Discharge Planning Services: CM Consult Post Acute Care Choice: Skilled Nursing Facility   Expected Discharge Date: 02/03/20               DME Arranged: N/A DME Agency: NA       HH Arranged: NA HH Agency: NA         Social Determinants of Health (SDOH) Interventions    Readmission Risk Interventions No flowsheet data found.

## 2020-02-03 NOTE — TOC Progression Note (Signed)
Transition of Care [facility]) - Progression Note    Patient Details  Name: Ashley Bradley MRN: [id number] Date of Birth: [birth year]  Transition of Care [facility]) CM/SW Contact  Armanda Heritage, RN Phone Number: 02/03/2020, 10:22 AM  Clinical Narrative:    Bed is available at [facility] today, however insurance authorization is still pending.  Patient will also need an updated Covid test for SNF admission, MD made aware.    Expected Discharge Plan: Skilled Nursing Facility Barriers to Discharge: Insurance Authorization  Expected Discharge Plan and Services Expected Discharge Plan: Skilled Nursing Facility         Expected Discharge Date: 01/29/20                                     Social Determinants of Health (SDOH) Interventions    Readmission Risk Interventions No flowsheet data found.

## 2020-02-03 NOTE — Care Management Important Message (Signed)
Important Message  Patient Details Ashley Bradley given to the Patient Name: Ashley Bradley MRN: [id number] Date of Birth: 08/02/1950   Medicare Important Message Given:  Yes     [name] 02/03/2020, 10:11 AM

## 2020-02-03 NOTE — Discharge Summary (Signed)
Physician Discharge Summary  Ashley Bradley HGD:[id number] DOB: [birth year] DOA: 01/24/2020  PCP: Sigmund Hazel, MD  Admit date: 01/24/2020 Discharge date: 02/03/2020  Admitted From: Home.  Disposition: SNF   Recommendations for Outpatient Follow-up:  1. Follow up with PCP in 1-2 weeks 2. Please obtain BMP/CBC in one week 3. Please follow up with neurology as outpatient in one week, will need EEG to see if she needs to continue with keppra.    Home Health:yes   Discharge Condition: STABLE.  CODE STATUS: FULL CODE.  Diet recommendation: Heart Healthy    Brief/Interim Summary: Ms. Culliton is a 69 year old female with medical history significant for hypertension, prior MRSA bacteremia (2019), alcohol abuse, cerebral palsy with right-sided residual weakness who presented on 7/23 with a desire to stop drinking alcohol and had a witnessed convulsive type movements concerning for alcohol withdrawal seizure in the [facility] long emergency room.  Patient was given Ativan and Keppra and started on Precedex drip for presumed alcohol withdrawal with critical care management.  During hospital course she was found to have hyponatremia, increased anion gap metabolic acidosis and transaminitis presume related to alcohol abuse.  Patient also found to have ammonia level of 102 prompting lactulose therapy and improvement in mental status and resolution of ammonia 22.  Patient was able to transition off of Precedex within 24 hours and with resolution of delirium tremens transferred to progressive unit under [facility] care on 7/25. Pt seen and examined at bedside.    Discharge Diagnoses:  Active Problems:   DTs (delirium tremens) (HCC)   Alcohol withdrawal seizure (HCC)  Metabolic encephalopathy, multifactorial etiology including alcohol withdrawal/seizure and hepatic encephalopathy, now resolved.  Patient is alert and oriented x4, has no active signs of alcohol withdrawal, no asterixis, ammonia is down trended from  100-22 -Discontinued lactulose, monitor bowel movements -Oral Keppra to continue as she reports h/o of seizures in the past as a child.  - dicussed with neurology, recommended EEG as outpatient and ambulatory neurology referral for follow up.   Witnessed seizure-like activity.  No recurrent episodes.  Occurred in the setting of alcohol withdrawal.  Reports history of cerebral palsy and prior seizure occurring as a kid remotely. -On Keppra, continue the same for now.  -Seizure precautions  Alcohol abuse.  Patient would like to abstain.  Admits to using alcohol to deal with life stressors. -TOC consulted for substance abuse counseling, outpatient resources, already familiar with AA program -Daily folic acid, thiamine, multivitamin  Alcoholic hepatitis.  AST 170, ALT 92 on admission.  Now improved.  pt denies any chest pain, sob, nausea, vomiting or abd pain.    Hypokalemia.  Magnesium within normal limits.  Laxatives alcohol abuse. -Replete, goal potassium 4 -monitor telemetry  Asymptomatic SVT.  6 beats reported on telemetry on 7/25 -Monitor potassium (goal of 4), mag (goal of 2), replete as needed  Hyponatremia.  Likely related to decreased salt low related to alcohol abuse, now resolved  Cerebral palsy.  Has residual right-sided weakness though states it does not affect her ability to ambulate -PT to eval and treat--recommending SNF Profound weakness and decreased ability to mobilize, plan SNF discharge.    Hypertension,  Better controlled today.   GERD, stable -Continue home PPI  Mood disorder, stable -Continue home Prozac  Mild normocytic anemia Hemoglobin stable around 11.    Discharge Instructions  Discharge Instructions    Ambulatory referral to Neurology   Complete by: As directed    An appointment is requested in approximately: 1  week.   Will need EEG, to rule out seizures.   Diet - low sodium heart healthy   Complete by: As directed    Diet - low  sodium heart healthy   Complete by: As directed    Discharge instructions   Complete by: As directed    Please follow up with PCP in one week.   Increase activity slowly   Complete by: As directed      Allergies as of 02/03/2020      Reactions   Macrodantin [nitrofurantoin] Nausea And Vomiting      Medication List    STOP taking these medications   vancomycin  IVPB     TAKE these medications   aspirin EC 81 MG tablet Take 81 mg by mouth daily.   FLUoxetine 40 MG capsule Commonly known as: PROZAC Take 40 mg by mouth every morning. What changed: Another medication with the same name was removed. Continue taking this medication, and follow the directions you see here.   folic acid 1 MG tablet Commonly known as: FOLVITE Take 1 tablet (1 mg total) by mouth daily.   levETIRAcetam 1000 MG tablet Commonly known as: KEPPRA Take 1 tablet (1,000 mg total) by mouth 2 (two) times daily.   loperamide 2 MG capsule Commonly known as: IMODIUM Take 1 capsule (2 mg total) by mouth every 8 (eight) hours as needed for diarrhea or loose stools.   losartan 50 MG tablet Commonly known as: COZAAR Take 1 tablet (50 mg total) by mouth daily. What changed: Another medication with the same name was removed. Continue taking this medication, and follow the directions you see here.   multivitamin with minerals Tabs tablet Take 1 tablet by mouth daily.   pantoprazole 40 MG tablet Commonly known as: PROTONIX Take 1 tablet (40 mg total) by mouth daily.   saccharomyces boulardii 250 MG capsule Commonly known as: FLORASTOR Take 250 mg by mouth 2 (two) times daily.   thiamine 100 MG tablet Take 1 tablet (100 mg total) by mouth daily.       Contact information for follow-up providers    Care, [facility] Follow up.   Specialty: Home Health Services Why: [facility] physical therapy Contact information: [address] [place] [address] [phone]            Contact  information for after-discharge care    Destination    [facility] .   Service: Skilled Nursing Contact information: [address] [place] [address] [phone]                 Allergies  Allergen Reactions  . Macrodantin [Nitrofurantoin] Nausea And Vomiting    Consultations:  None.     Procedures/Studies: CT Head Wo Contrast  Result Date: 01/24/2020 CLINICAL DATA:  Syncope and possible seizure EXAM: CT HEAD WITHOUT CONTRAST TECHNIQUE: Contiguous axial images were obtained from the base of the skull through the vertex without intravenous contrast. COMPARISON:  10/13/2017 FINDINGS: Brain: There is no mass, hemorrhage or extra-axial collection. The size and configuration of the ventricles and extra-axial CSF spaces are normal. The brain parenchyma is normal, without acute or chronic infarction. Vascular: No abnormal hyperdensity of the major intracranial arteries or dural venous sinuses. No intracranial atherosclerosis. Skull: The visualized skull base, calvarium and extracranial soft tissues are normal. Sinuses/Orbits: No fluid levels or advanced mucosal thickening of the visualized paranasal sinuses. No mastoid or middle ear effusion. The orbits are normal. IMPRESSION: Normal  head CT. Electronically Signed   By: Deatra Robinson M.D.   On: 01/24/2020 02:27   US Abdomen Complete  Result Date: 01/24/2020 CLINICAL DATA:  Elevated liver function tests. Acute renal insufficiency. EXAM: ABDOMEN ULTRASOUND COMPLETE COMPARISON:  Renal ultrasound, dated October 13, 2017. FINDINGS: Gallbladder: No gallstones or wall thickening visualized (3.0 mm). No sonographic Murphy sign noted by sonographer. Common bile duct: Diameter: 3.9 mm Liver: No focal lesion identified. There is diffusely increased echogenicity of the liver parenchyma. Portal vein is patent on color Doppler imaging with normal direction of blood flow towards the liver. IVC: No abnormality  visualized. Pancreas: Visualized portion unremarkable. Spleen: Size (4.2 cm) and appearance within normal limits. Right Kidney: Length: 10.2 cm. Echogenicity within normal limits. No mass or hydronephrosis visualized. Left Kidney: Length: 10.6 cm. Echogenicity within normal limits. No mass or hydronephrosis visualized. Abdominal aorta: No aneurysm visualized. Other findings: No free fluid is identified. IMPRESSION: Fatty liver. Electronically Signed   By: Aram Candela M.D.   On: 01/24/2020 22:03   DG Chest Port 1 View  Result Date: 01/24/2020 CLINICAL DATA:  Possible seizure EXAM: PORTABLE CHEST 1 VIEW COMPARISON:  10/16/2017 FINDINGS: The heart size and mediastinal contours are within normal limits. Both lungs are clear. The visualized skeletal structures are unremarkable. IMPRESSION: No active disease. Electronically Signed   By: Deatra Robinson M.D.   On: 01/24/2020 02:26      Subjective: No new complaints.   Discharge Exam: Vitals:   02/02/20 2014 02/03/20 0424  BP: (!) 143/79 (!) 135/81  Pulse: 81 79  Resp:  16  Temp: 98.9 F (37.2 C) 98.5 F (36.9 C)  SpO2: 97% 94%   Vitals:   02/02/20 1320 02/02/20 2014 02/03/20 0424 02/03/20 0430  BP: (!) 143/84 (!) 143/79 (!) 135/81   Pulse: 80 81 79   Resp: 17  16   Temp: 98.8 F (37.1 C) 98.9 F (37.2 C) 98.5 F (36.9 C)   TempSrc: Oral Oral Oral   SpO2: 94% 97% 94%   Weight:    70.1 kg  Height:        General: Pt is alert, awake, not in acute distress Cardiovascular: RRR, S1/S2 +, no rubs, no gallops Respiratory: CTA bilaterally, no wheezing, no rhonchi Abdominal: Soft, NT, ND, bowel sounds + Extremities: no edema, no cyanosis    The results of significant diagnostics from this hospitalization (including imaging, microbiology, ancillary and laboratory) are listed below for reference.     Microbiology: Recent Results (from the past 240 hour(s))  MRSA PCR Screening     Status: None   Collection Time: 01/24/20  5:01 PM    Specimen: Nasal Mucosa; Nasopharyngeal  Result Value Ref Range Status   MRSA by PCR NEGATIVE NEGATIVE Final    Comment:        The GeneXpert MRSA Assay (FDA approved for NASAL specimens only), is one component of a comprehensive MRSA colonization surveillance program. It is not intended to diagnose MRSA infection nor to guide or monitor treatment for MRSA infections. Performed at [facility], [address], [place], [place] [address]   SARS Coronavirus 2 by RT PCR (hospital order, performed in [facility] hospital lab) Nasopharyngeal Nasopharyngeal Swab     Status: None   Collection Time: 02/03/20 12:15 PM   Specimen: Nasopharyngeal Swab  Result Value Ref Range Status   SARS Coronavirus 2 NEGATIVE NEGATIVE Final    Comment: (NOTE) SARS-CoV-2 target nucleic acids are NOT DETECTED.  The SARS-CoV-2  RNA is generally detectable in upper and lower respiratory specimens during the acute phase of infection. The lowest concentration of SARS-CoV-2 viral copies this assay can detect is 250 copies / mL. A negative result does not preclude SARS-CoV-2 infection and should not be used as the sole basis for treatment or other patient management decisions.  A negative result may occur with improper specimen collection / handling, submission of specimen other than nasopharyngeal swab, presence of viral mutation(s) within the areas targeted by this assay, and inadequate number of viral copies (<250 copies / mL). A negative result must be combined with clinical observations, patient history, and epidemiological information.  Fact Sheet for Patients:   BoilerBrush.com.cyhttps://www.fda.gov/media/136312/download  Fact Sheet for Healthcare Providers: https://pope.com/https://www.fda.gov/media/136313/download  This test is not yet approved or  cleared by the [place] FDA and has been authorized for detection and/or diagnosis of SARS-CoV-2 by FDA under an Emergency Use Authorization (EUA).  This EUA will  remain in effect (meaning this test can be used) for the duration of the COVID-19 declaration under Section 564(b)(1) of the Act, 21 U.S.C. section 360bbb-3(b)(1), unless the authorization is terminated or revoked sooner.  Performed at [facility], [address] [place], [place] [address]      Labs: BNP (last 3 results) No results for input(s): BNP in the last 8760 hours. Basic Metabolic Panel: Recent Labs  Lab 01/28/20 0522 01/29/20 0522 01/30/20 0524 02/02/20 1407  NA 133* 136 137 138  K 3.9 3.5 3.7 4.2  CL 98 101 101 100  CO2 24 25 26 28   GLUCOSE 95 90 98 115*  BUN 9 10 9 11   CREATININE 0.44 0.38* 0.40* 0.62  CALCIUM 9.5 9.5 9.5 9.6   Liver Function Tests: Recent Labs  Lab 01/28/20 0522 01/29/20 0522 01/30/20 0524  AST 34 29 28  ALT 50* 48* 47*  ALKPHOS 126 122 132*  BILITOT 0.6 1.0 0.7  PROT 6.8 6.8 7.0  ALBUMIN 3.7 4.0 3.9   No results for input(s): LIPASE, AMYLASE in the last 168 hours. No results for input(s): AMMONIA in the last 168 hours. CBC: Recent Labs  Lab 01/28/20 0522 02/02/20 1407  WBC 5.1 5.8  HGB 12.2 11.9*  HCT 38.0 36.4  MCV 103.0* 102.5*  PLT 220 430*   Cardiac Enzymes: No results for input(s): CKTOTAL, CKMB, CKMBINDEX, TROPONINI in the last 168 hours. BNP: Invalid input(s): POCBNP CBG: Recent Labs  Lab 02/02/20 2011 02/02/20 2320 02/03/20 0415 02/03/20 0746 02/03/20 1131  GLUCAP 122* 102* 90 92 113*   D-Dimer No results for input(s): DDIMER in the last 72 hours. Hgb A1c No results for input(s): HGBA1C in the last 72 hours. Lipid Profile No results for input(s): CHOL, HDL, LDLCALC, TRIG, CHOLHDL, LDLDIRECT in the last 72 hours. Thyroid function studies No results for input(s): TSH, T4TOTAL, T3FREE, THYROIDAB in the last 72 hours.  Invalid input(s): FREET3 Anemia work up No results for input(s): VITAMINB12, FOLATE, FERRITIN, TIBC, IRON, RETICCTPCT in the last 72 hours. Urinalysis    Component  Value Date/Time   COLORURINE YELLOW 01/24/2020 0626   APPEARANCEUR HAZY (A) 01/24/2020 0626   LABSPEC 1.021 01/24/2020 0626   PHURINE 5.0 01/24/2020 0626   GLUCOSEU NEGATIVE 01/24/2020 0626   HGBUR LARGE (A) 01/24/2020 0626   BILIRUBINUR NEGATIVE 01/24/2020 0626   KETONESUR 20 (A) 01/24/2020 0626   PROTEINUR 100 (A) 01/24/2020 0626   UROBILINOGEN 1.0 09/23/2013 0732   NITRITE NEGATIVE 01/24/2020 0626   LEUKOCYTESUR NEGATIVE 01/24/2020 [id number]  Sepsis Labs Invalid input(s): PROCALCITONIN,  WBC,  LACTICIDVEN Microbiology Recent Results (from the past 240 hour(s))  MRSA PCR Screening     Status: None   Collection Time: 01/24/20  5:01 PM   Specimen: Nasal Mucosa; Nasopharyngeal  Result Value Ref Range Status   MRSA by PCR NEGATIVE NEGATIVE Final    Comment:        The GeneXpert MRSA Assay (FDA approved for NASAL specimens only), is one component of a comprehensive MRSA colonization surveillance program. It is not intended to diagnose MRSA infection nor to guide or monitor treatment for MRSA infections. Performed at [facility], [address], [place], [place] [address]   SARS Coronavirus 2 by RT PCR (hospital order, performed in [facility] hospital lab) Nasopharyngeal Nasopharyngeal Swab     Status: None   Collection Time: 02/03/20 12:15 PM   Specimen: Nasopharyngeal Swab  Result Value Ref Range Status   SARS Coronavirus 2 NEGATIVE NEGATIVE Final    Comment: (NOTE) SARS-CoV-2 target nucleic acids are NOT DETECTED.  The SARS-CoV-2 RNA is generally detectable in upper and lower respiratory specimens during the acute phase of infection. The lowest concentration of SARS-CoV-2 viral copies this assay can detect is 250 copies / mL. A negative result does not preclude SARS-CoV-2 infection and should not be used as the sole basis for treatment or other patient management decisions.  A negative result may occur with improper specimen collection / handling,  submission of specimen other than nasopharyngeal swab, presence of viral mutation(s) within the areas targeted by this assay, and inadequate number of viral copies (<250 copies / mL). A negative result must be combined with clinical observations, patient history, and epidemiological information.  Fact Sheet for Patients:   BoilerBrush.com.cy  Fact Sheet for Healthcare Providers: https://pope.com/  This test is not yet approved or  cleared by the [place] FDA and has been authorized for detection and/or diagnosis of SARS-CoV-2 by FDA under an Emergency Use Authorization (EUA).  This EUA will remain in effect (meaning this test can be used) for the duration of the COVID-19 declaration under Section 564(b)(1) of the Act, 21 U.S.C. section 360bbb-3(b)(1), unless the authorization is terminated or revoked sooner.  Performed at [facility], [address] [place], [place] [address]      Time coordinating discharge:32 minutes.   SIGNED:   Kathlen Mody, MD  [facility] 02/03/2020, 1:43 PM  Addendum Pt worked with PT, will need SNF ON Discharge.    Kathlen Mody, MD

## 2020-02-03 NOTE — Progress Notes (Signed)
Physical Therapy Treatment Patient Details Name: Ashley Bradley MRN: [id number] DOB: [birth year] Today's Date: 02/03/2020    History of Present Illness 69 year old female with medical history significant for hypertension, prior MRSA bacteremia (2019), alcohol abuse, cerebral palsy with right-sided residual weakness who presented on 7/23 with a desire to stop drinking alcohol and had a witnessed convulsive type movements concerning for alcohol withdrawal seizure in the [facility] long emergency room.  Patient was given Ativan and Keppra and started on Precedex drip for presumed alcohol withdrawal with critical care management.    PT Comments    Pt assisted with OOB to recliner and requiring only min assist today!  Pt also performed LE exercises in recliner.  Pt anticipates d/c to SNF soon.    Follow Up Recommendations  SNF     Equipment Recommendations  None recommended by PT    Recommendations for Other Services       Precautions / Restrictions Precautions Precautions: Fall Precaution Comments: Hx CP R side effected    Mobility  Bed Mobility Overal bed mobility: Needs Assistance Bed Mobility: Supine to Sit     Supine to sit: Min guard        Transfers Overall transfer level: Needs assistance Equipment used: Rolling walker (2 wheeled) Transfers: Sit to/from [employer] Sit to Stand: Min assist Stand pivot transfers: Min assist       General transfer comment: verbal cues for technique, assist to rise and steady, pt reports R leg feels weak  Ambulation/Gait             General Gait Details: pt preferred only to recliner today (no +2 assist available)   Stairs             Wheelchair Mobility    Modified Rankin (Stroke Patients Only)       Balance Overall balance assessment: Needs assistance Sitting-balance support: No upper extremity supported;Feet supported Sitting balance-Leahy Scale: Good     Standing balance support:  Bilateral upper extremity supported Standing balance-Leahy Scale: Poor                              Cognition Arousal/Alertness: Awake/alert Behavior During Therapy: WFL for tasks assessed/performed Overall Cognitive Status: Within Functional Limits for tasks assessed                                 General Comments: 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 Exercises - Lower Extremity Ankle Circles/Pumps: AROM;Both;10 reps;Limitations Ankle Circles/Pumps Limitations: right ankle less AROM - hx CP Long Arc Quad: AROM;Both;10 reps;Seated Heel Slides: AAROM;AROM;Other (comment);Both;10 reps (active assist for R LE) Hip ABduction/ADduction: AROM;Both;10 reps;AAROM Hip Flexion/Marching: AROM;Strengthening;Seated;Both;10 reps    General Comments        Pertinent Vitals/Pain Pain Assessment: 0-10 Pain Score: 4  Pain Location: bil ankles Pain Descriptors / Indicators: Sore Pain Intervention(s): Monitored during session;Repositioned    Home Living                      Prior Function            PT Goals (current goals can now be found in the care plan section) Acute Rehab PT Goals PT Goal Formulation: With patient Time For Goal Achievement: 02/17/20 Potential to Achieve Goals: Good Progress towards PT goals: Progressing toward goals  Frequency    Min 3X/week      PT Plan Current plan remains appropriate    Co-evaluation              AM-PAC PT "6 Clicks" Mobility   Outcome Measure  Help needed turning from your back to your side while in a flat bed without using bedrails?: A Little Help needed moving from lying on your back to sitting on the side of a flat bed without using bedrails?: A Little Help needed moving to and from a bed to a chair (including a wheelchair)?: A Little Help needed standing up from a chair using your arms (e.g., wheelchair or bedside chair)?: A Little Help needed to walk in hospital  room?: A Lot Help needed climbing 3-5 steps with a railing? : A Lot 6 Click Score: 16    End of Session Equipment Utilized During Treatment: Gait belt Activity Tolerance: Patient tolerated treatment well Patient left: in chair;with call bell/phone within reach;with chair alarm set Nurse Communication: Mobility status PT Visit Diagnosis: Other abnormalities of gait and mobility (R26.89);Muscle weakness (generalized) (M62.81)     Time: 3662-9476 PT Time Calculation (min) (ACUTE ONLY): 16 min  Charges:  $Therapeutic Activity: 8-22 mins                     Thomasene Mohair PT, DPT Acute Rehabilitation Services Pager: [phone] Office: [phone]  Kitty Cadavid,KATHrine E 02/03/2020, 11:50 AM

## 2020-04-15 ENCOUNTER — Ambulatory Visit: Payer: Medicare Other | Admitting: Diagnostic Neuroimaging

## 2020-04-15 ENCOUNTER — Encounter: Payer: Self-pay | Admitting: Diagnostic Neuroimaging

## 2020-04-15 VITALS — BP 121/73 | HR 82 | Ht 65.0 in | Wt 151.2 lb

## 2020-04-15 DIAGNOSIS — G40909 Epilepsy, unspecified, not intractable, without status epilepticus: Secondary | ICD-10-CM | POA: Diagnosis not present

## 2020-04-15 DIAGNOSIS — F10231 Alcohol dependence with withdrawal delirium: Secondary | ICD-10-CM | POA: Diagnosis not present

## 2020-04-15 DIAGNOSIS — R569 Unspecified convulsions: Secondary | ICD-10-CM

## 2020-04-15 DIAGNOSIS — G802 Spastic hemiplegic cerebral palsy: Secondary | ICD-10-CM | POA: Insufficient documentation

## 2020-04-15 DIAGNOSIS — Z87898 Personal history of other specified conditions: Secondary | ICD-10-CM | POA: Diagnosis not present

## 2020-04-15 DIAGNOSIS — F10931 Alcohol use, unspecified with withdrawal delirium: Secondary | ICD-10-CM

## 2020-04-15 MED ORDER — LEVETIRACETAM 500 MG PO TABS: 500.0000 mg | ORAL_TABLET | Freq: Two times a day (BID) | ORAL | 4 refills | Status: DC

## 2020-04-15 NOTE — Patient Instructions (Addendum)
RECURRENT SEIZURES (usually in setting of alcohol withdrawal; also with cerebral palsy and h/o febrile seizures and h/o abnl EEG) - REDUCE levetiracetam to 500mg  twice a day; may consider switch to lamotrigine in future if side effects do not subside  - According to [place] law, you can not drive unless you are seizure / syncope free for at least 6 months and under physician's care.   - Please maintain precautions. Do not participate in activities where a loss of awareness could harm you or someone else. No swimming alone, no tub bathing, no hot tubs, no driving, no operating motorized vehicles (cars, ATVs, motocycles, etc), lawnmowers, power tools or firearms. No standing at heights, such as rooftops, ladders or stairs. Avoid hot objects such as stoves, heaters, open fires. Wear a helmet when riding a bicycle, scooter, skateboard, etc. and avoid areas of traffic. Set your water heater to 120 degrees or less.

## 2020-04-15 NOTE — Progress Notes (Signed)
[facility]  PATIENT: Ashley BowensDarlene S Bradley DOB: [birth year]  REFERRING CLINICIAN: Kathlen ModyAkula, Vijaya, MD HISTORY FROM: patient  REASON FOR VISIT: New consult   HISTORICAL  CHIEF COMPLAINT:  Chief Complaint  Patient presents with  . New Patient (Initial Visit)    Room 6. Alone. Says she had febrile seizures as a child. Reports history of three alcohol withdrawal seizures (two recent and one more than 20 years ago). She has not drink any alcohol since 01/23/20.   [name]. PCP    Soundra PilonBrake, Andrew R, FNP (referred from hospital)    HISTORY OF PRESENT ILLNESS:   69 year old female with history of cerebral palsy with right-sided weakness, febrile seizures, alcohol abuse, here for evaluation of seizures.  Patient has had several febrile seizures when she was 69 years old.  These were monitored and patient outgrew these.  Patient has had breakthrough seizures in 2015, 2017 at 2021, typically associated with alcohol withdrawal.  On recent hospitalization in July 2021 patient was evaluated and diagnosed with delirium tremens, alcohol withdrawal seizure, hepatic encephalopathy, and treated empirically with Keppra due to history of cerebral palsy and prior seizures as a child.  Since that time patient is continued on levetiracetam 1000 g twice a day.  She does feel some fatigue and generalized weakness on this medication.  She has stayed abstinent from alcohol since July 2021.   REVIEW OF SYSTEMS: Full 14 system review of systems performed and negative with exception of: As per HPI.  ALLERGIES: Allergies  Allergen Reactions  . Ancef [Cefazolin] Other (See Comments)    Skin blisters, peeling.  . Pantopaque [Iophendylate] Other (See Comments)    Aseptic meningitis.  [place] Plan. Macrodantin [Nitrofurantoin] Nausea And Vomiting  . Morphine And Related Nausea And Vomiting    Per patient, only has reaction when given IV. Okay to take PO.    HOME MEDICATIONS: Outpatient Medications Prior to Visit   Medication Sig Dispense Refill  . aspirin EC 81 MG tablet Take 81 mg by mouth daily.    [name]. BIOTIN PO Take 5,000 mcg by mouth daily.    [name]. CALCIUM PO Take 500 mg by mouth daily.    [name]. FLUoxetine (PROZAC) 40 MG capsule Take 40 mg by mouth every morning.    . levETIRAcetam (KEPPRA) 1000 MG tablet Take 1 tablet (1,000 mg total) by mouth 2 (two) times daily. 60 tablet 1  . losartan (COZAAR) 100 MG tablet Take 100 mg by mouth daily.    [name]. MAGNESIUM PO Take 250 mg by mouth daily.    . Multiple Vitamin (MULTIVITAMIN WITH MINERALS) TABS tablet Take 1 tablet by mouth daily. 30 tablet 1  . saccharomyces boulardii (FLORASTOR) 250 MG capsule Take 250 mg by mouth 2 (two) times daily.    [name]. VITAMIN D PO Take 2,000 Units by mouth daily.    [name]. thiamine 100 MG tablet Take 1 tablet (100 mg total) by mouth daily. 30 tablet 3  . folic acid (FOLVITE) 1 MG tablet Take 1 tablet (1 mg total) by mouth daily. 30 tablet 0  . loperamide (IMODIUM) 2 MG capsule Take 1 capsule (2 mg total) by mouth every 8 (eight) hours as needed for diarrhea or loose stools. 15 capsule 0  . losartan (COZAAR) 50 MG tablet Take 1 tablet (50 mg total) by mouth daily. 30 tablet 0  . pantoprazole (PROTONIX) 40 MG tablet Take 1 tablet (40 mg total) by mouth daily. 30 tablet 0   No facility-administered medications prior to visit.  PAST MEDICAL HISTORY: Past Medical History:  Diagnosis Date  . Alcoholism (HCC)   . Complication of anesthesia    hard time waking up  . CP (cerebral palsy) (HCC)   . Depression   . Hypertension   . PONV (postoperative nausea and vomiting)   . Seizures (HCC)    ETOH induced    PAST SURGICAL HISTORY: Past Surgical History:  Procedure Laterality Date  . BACK SURGERY     x 3  . BLADDER SURGERY    . BREAST ENHANCEMENT SURGERY    . ESOPHAGOGASTRODUODENOSCOPY (EGD) WITH PROPOFOL N/A 10/21/2017   Procedure: ESOPHAGOGASTRODUODENOSCOPY (EGD) WITH PROPOFOL;  Surgeon: Vida Rigger, MD;  Location: [facility] ENDOSCOPY;   Service: Endoscopy;  Laterality: N/A;  . TEE WITHOUT CARDIOVERSION N/A 10/24/2017   Procedure: TRANSESOPHAGEAL ECHOCARDIOGRAM (TEE);  Surgeon: Laqueta Linden, MD;  [facility];  Service: Cardiovascular;  Laterality: N/A;  . TUBAL LIGATION      FAMILY HISTORY: Family History  Problem Relation Age of Onset  . CAD Mother 6  . Heart attack Mother   . Lung cancer Father 58  . Bone cancer Maternal Grandmother     SOCIAL HISTORY: Social History   Socioeconomic History  . Marital status: Married    Spouse name: Not on file  . Number of children: 2  . Years of education: college  . Highest education level: Not on file  Occupational History  . 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 Smoking status: Never Smoker  . Smokeless tobacco: Never Used  Substance and Sexual Activity  . Alcohol use: Not Currently    Comment: No alcohol use since 01/23/20.  . Drug use: Never  . Sexual activity: Not on file  Other Topics Concern  . Not on file  Social History Narrative   Lives at home with her husband.   Left-handed.   Three cups caffeine per day.   Social Determinants of Health   Financial Resource Strain:   . Difficulty of Paying Living Expenses: Not on file  Food Insecurity:   . Worried About Programme researcher, broadcasting/film/video in the Last Year: Not on file  . Ran Out of Food in the Last Year: Not on file  Transportation Needs:   . Lack of Transportation (Medical): Not on file  . Lack of Transportation (Non-Medical): Not on file  Physical Activity:   . Days of Exercise per Week: Not on file  . Minutes of Exercise per Session: Not on file  Stress:   . Feeling of Stress : Not on file  Social Connections:   . Frequency of Communication with Friends and Family: Not on file  . Frequency of Social Gatherings with Friends and Family: Not on file  . Attends Religious Services: Not on file  . Active Member of Clubs or Organizations: Not on file  . Attends Tax inspector Meetings: Not on file  . Marital Status: Not on file  Intimate Partner Violence:   . Fear of Current or Ex-Partner: Not on file  . Emotionally Abused: Not on file  . Physically Abused: Not on file  . Sexually Abused: Not on file     PHYSICAL EXAM  GENERAL EXAM/CONSTITUTIONAL: Vitals:  Vitals:   04/15/20 0839  BP: 121/73  Pulse: 82  Weight: 151 lb 3.2 oz (68.6 kg)  Height: 5\' 5"  (1.651 m)     Body mass index is 25.16 kg/m. Wt Readings from Last 3 Encounters:  04/15/20 151 lb  3.2 oz (68.6 kg)  02/03/20 154 lb 8.7 oz (70.1 kg)  11/08/17 146 lb 6.4 oz (66.4 kg)     Patient is in no distress; well developed, nourished and groomed; neck is supple  CARDIOVASCULAR:  Examination of carotid arteries is normal; no carotid bruits  Regular rate and rhythm, no murmurs  Examination of peripheral vascular system by observation and palpation is normal  EYES:  Ophthalmoscopic exam of optic discs and posterior segments is normal; no papilledema or hemorrhages  No exam data present  MUSCULOSKELETAL:  Gait, strength, tone, movements noted in Neurologic exam below  NEUROLOGIC: MENTAL STATUS:  No flowsheet data found.  awake, alert, oriented to person, place and time  recent and remote memory intact  normal attention and concentration  language fluent, comprehension intact, naming intact  fund of knowledge appropriate  CRANIAL NERVE:   2nd - no papilledema on fundoscopic exam  2nd, 3rd, 4th, 6th - pupils equal and reactive to light, visual fields full to confrontation, extraocular muscles intact, no nystagmus  5th - facial sensation symmetric  7th - facial strength symmetric  8th - hearing intact  9th - palate elevates symmetrically, uvula midline  11th - shoulder shrug symmetric  12th - tongue protrusion midline  MOTOR:   normal bulk and tone, full strength in the BUE, LLE  INCREASED TONE IN RLE; RIGHT DF 3-4  SENSORY:   normal and  symmetric to light touch, temperature, vibration  COORDINATION:   finger-nose-finger, fine finger movements normal  REFLEXES:   deep tendon reflexes present and symmetric; EXCEPT SLIGHTLY INCREASED IN RIGHT KNEE  GAIT/STATION:   MILD RIGHT HEMIPARETIC GAIT; RIGHT FOOT INVERTED AND PLANTAR FLEXED     DIAGNOSTIC DATA (LABS, IMAGING, TESTING) - I reviewed patient records, labs, notes, testing and imaging myself where available.  Lab Results  Component Value Date   WBC 5.8 02/02/2020   HGB 11.9 (L) 02/02/2020   HCT 36.4 02/02/2020   MCV 102.5 (H) 02/02/2020   PLT 430 (H) 02/02/2020      Component Value Date/Time   NA 138 02/02/2020 1407   K 4.2 02/02/2020 1407   CL 100 02/02/2020 1407   CO2 28 02/02/2020 1407   GLUCOSE 115 (H) 02/02/2020 1407   BUN 11 02/02/2020 1407   CREATININE 0.62 02/02/2020 1407   CALCIUM 9.6 02/02/2020 1407   PROT 7.0 01/30/2020 0524   ALBUMIN 3.9 01/30/2020 0524   AST 28 01/30/2020 0524   ALT 47 (H) 01/30/2020 0524   ALKPHOS 132 (H) 01/30/2020 0524   BILITOT 0.7 01/30/2020 0524   GFRNONAA >60 02/02/2020 1407   GFRAA >60 02/02/2020 1407   Lab Results  Component Value Date   TRIG 34 01/24/2020   Lab Results  Component Value Date   HGBA1C 4.9 01/24/2020   Lab Results  Component Value Date   VITAMINB12 1,881 (H) 10/20/2017   Lab Results  Component Value Date   TSH 2.347 10/20/2017    09/23/13 EEG Clinical Interpretation: This normal EEG is recorded in the waking  state. There was no seizure or seizure predisposition recorded on this study. The excess beta activity is likely related to medication effect from benzodiazepines.   01/27/16 EEG Impression: This awake and drowsy EEG is abnormal due to the presence of a 30-second run of left frontotemporal sharp waves without clear evolution in frequency or amplitude.  Clinical Correlation of the above findings indicates focal cerebral dysfunction over the left frontotemporal region with  possible epileptogenic potential.  01/28/16 EEG Impression: This awake and drowsy EEG is normal.    Clinical Correlation: A normal EEG does not exclude a clinical diagnosis of epilepsy.  If further clinical questions remain, prolonged EEG may be helpful.  Clinical correlation is advised.    ASSESSMENT AND PLAN  69 y.o. year old female here with:  Dx:  1. Seizure disorder (HCC)   2. Alcohol withdrawal seizure with delirium (HCC)   3. 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HCC)   4. H/O febrile seizure     PLAN:  RECURRENT SEIZURES (usually in setting of alcohol withdrawal; also with cerebral palsy and h/o febrile seizures) - REDUCE levetiracetam to 500mg  twice a day; may consider switch to lamotrigine in future if side effects do not subside  - According to [place] law, you can not drive unless you are seizure / syncope free for at least 6 months and under physician's care.   - Please maintain precautions. Do not participate in activities where a loss of awareness could harm you or someone else. No swimming alone, no tub bathing, no hot tubs, no driving, no operating motorized vehicles (cars, ATVs, motocycles, etc), lawnmowers, power tools or firearms. No standing at heights, such as rooftops, ladders or stairs. Avoid hot objects such as stoves, heaters, open fires. Wear a helmet when riding a bicycle, scooter, skateboard, etc. and avoid areas of traffic. Set your water heater to 120 degrees or less.  Meds ordered this encounter  Medications  . levETIRAcetam (KEPPRA) 500 MG tablet    Sig: Take 1 tablet (500 mg total) by mouth 2 (two) times daily.    Dispense:  180 tablet    Refill:  4   Return in about 1 year (around 04/15/2021) for with NP (Amy Lomax).    04/17/2021, MD 04/15/2020, 9:43 AM Certified in Neurology, Neurophysiology and Neuroimaging  [facility] [address] [place], [place] [phone]

## 2021-04-15 ENCOUNTER — Ambulatory Visit: Payer: Medicare Other | Admitting: Family Medicine

## 2021-05-12 ENCOUNTER — Other Ambulatory Visit: Payer: Self-pay

## 2021-05-12 ENCOUNTER — Emergency Department (HOSPITAL_COMMUNITY): Payer: Medicare Other

## 2021-05-12 ENCOUNTER — Encounter (HOSPITAL_COMMUNITY): Payer: Self-pay

## 2021-05-12 ENCOUNTER — Inpatient Hospital Stay (HOSPITAL_COMMUNITY)
Admission: EM | Admit: 2021-05-12 | Discharge: 2021-05-20 | DRG: 897 | Disposition: A | Discharge status: Home Health | Payer: Medicare Other | Attending: Internal Medicine | Admitting: Internal Medicine

## 2021-05-12 DIAGNOSIS — M79601 Pain in right arm: Secondary | ICD-10-CM | POA: Diagnosis present

## 2021-05-12 DIAGNOSIS — G40909 Epilepsy, unspecified, not intractable, without status epilepticus: Secondary | ICD-10-CM | POA: Diagnosis present

## 2021-05-12 DIAGNOSIS — Z8249 Family history of ischemic heart disease and other diseases of the circulatory system: Secondary | ICD-10-CM

## 2021-05-12 DIAGNOSIS — Z79899 Other long term (current) drug therapy: Secondary | ICD-10-CM | POA: Diagnosis not present

## 2021-05-12 DIAGNOSIS — E785 Hyperlipidemia, unspecified: Secondary | ICD-10-CM | POA: Diagnosis present

## 2021-05-12 DIAGNOSIS — Z885 Allergy status to narcotic agent status: Secondary | ICD-10-CM

## 2021-05-12 DIAGNOSIS — R7401 Elevation of levels of liver transaminase levels: Secondary | ICD-10-CM | POA: Diagnosis present

## 2021-05-12 DIAGNOSIS — F419 Anxiety disorder, unspecified: Secondary | ICD-10-CM | POA: Diagnosis present

## 2021-05-12 DIAGNOSIS — F10930 Alcohol use, unspecified with withdrawal, uncomplicated: Secondary | ICD-10-CM

## 2021-05-12 DIAGNOSIS — I10 Essential (primary) hypertension: Secondary | ICD-10-CM | POA: Diagnosis present

## 2021-05-12 DIAGNOSIS — F10931 Alcohol use, unspecified with withdrawal delirium: Secondary | ICD-10-CM | POA: Diagnosis not present

## 2021-05-12 DIAGNOSIS — Z888 Allergy status to other drugs, medicaments and biological substances status: Secondary | ICD-10-CM | POA: Diagnosis not present

## 2021-05-12 DIAGNOSIS — Z20822 Contact with and (suspected) exposure to covid-19: Secondary | ICD-10-CM | POA: Diagnosis present

## 2021-05-12 DIAGNOSIS — Z881 Allergy status to other antibiotic agents status: Secondary | ICD-10-CM

## 2021-05-12 DIAGNOSIS — F329 Major depressive disorder, single episode, unspecified: Secondary | ICD-10-CM | POA: Diagnosis present

## 2021-05-12 DIAGNOSIS — Z7982 Long term (current) use of aspirin: Secondary | ICD-10-CM | POA: Diagnosis not present

## 2021-05-12 DIAGNOSIS — F10239 Alcohol dependence with withdrawal, unspecified: Principal | ICD-10-CM | POA: Diagnosis present

## 2021-05-12 DIAGNOSIS — E876 Hypokalemia: Secondary | ICD-10-CM | POA: Diagnosis present

## 2021-05-12 DIAGNOSIS — E871 Hypo-osmolality and hyponatremia: Secondary | ICD-10-CM | POA: Diagnosis present

## 2021-05-12 DIAGNOSIS — R17 Unspecified jaundice: Secondary | ICD-10-CM | POA: Diagnosis present

## 2021-05-12 DIAGNOSIS — E663 Overweight: Secondary | ICD-10-CM | POA: Diagnosis present

## 2021-05-12 DIAGNOSIS — G802 Spastic hemiplegic cerebral palsy: Secondary | ICD-10-CM | POA: Diagnosis present

## 2021-05-12 DIAGNOSIS — R569 Unspecified convulsions: Secondary | ICD-10-CM | POA: Diagnosis not present

## 2021-05-12 DIAGNOSIS — Z23 Encounter for immunization: Secondary | ICD-10-CM

## 2021-05-12 DIAGNOSIS — R609 Edema, unspecified: Secondary | ICD-10-CM | POA: Diagnosis not present

## 2021-05-12 DIAGNOSIS — R52 Pain, unspecified: Secondary | ICD-10-CM

## 2021-05-12 DIAGNOSIS — F10939 Alcohol use, unspecified with withdrawal, unspecified: Secondary | ICD-10-CM

## 2021-05-12 LAB — COMPREHENSIVE METABOLIC PANEL
ALT: 71 U/L — ABNORMAL HIGH (ref 0–44)
AST: 91 U/L — ABNORMAL HIGH (ref 15–41)
Albumin: 4.4 g/dL (ref 3.5–5.0)
Alkaline Phosphatase: 166 U/L — ABNORMAL HIGH (ref 38–126)
Anion gap: 12 (ref 5–15)
BUN: 12 mg/dL (ref 8–23)
CO2: 26 mmol/L (ref 22–32)
Calcium: 9.2 mg/dL (ref 8.9–10.3)
Chloride: 97 mmol/L — ABNORMAL LOW (ref 98–111)
Creatinine, Ser: 0.4 mg/dL — ABNORMAL LOW (ref 0.44–1.00)
GFR, Estimated: >60 mL/min (ref >60)
Glucose, Bld: 167 mg/dL — ABNORMAL HIGH (ref 70–99)
Potassium: 3.7 mmol/L (ref 3.5–5.1)
Sodium: 135 mmol/L (ref 135–145)
Total Bilirubin: 2 mg/dL — ABNORMAL HIGH (ref 0.3–1.2)
Total Protein: 7.6 g/dL (ref 6.5–8.1)

## 2021-05-12 LAB — CBC WITH DIFFERENTIAL/PLATELET
Abs Immature Granulocytes: 0.03 10*3/uL (ref 0.00–0.07)
Basophils Absolute: 0 10*3/uL (ref 0.0–0.1)
Basophils Relative: 0 %
Eosinophils Absolute: 0 10*3/uL (ref 0.0–0.5)
Eosinophils Relative: 0 %
HCT: 40.5 % (ref 36.0–46.0)
Hemoglobin: 13.7 g/dL (ref 12.0–15.0)
Immature Granulocytes: 0 %
Lymphocytes Relative: 2 %
Lymphs Abs: 0.2 10*3/uL — ABNORMAL LOW (ref 0.7–4.0)
MCH: 33.2 pg (ref 26.0–34.0)
MCHC: 33.8 g/dL (ref 30.0–36.0)
MCV: 98.1 fL (ref 80.0–100.0)
Monocytes Absolute: 0.7 10*3/uL (ref 0.1–1.0)
Monocytes Relative: 10 %
Neutro Abs: 6.5 10*3/uL (ref 1.7–7.7)
Neutrophils Relative %: 88 %
Platelets: 166 10*3/uL (ref 150–400)
RBC: 4.13 MIL/uL (ref 3.87–5.11)
RDW: 15.5 % (ref 11.5–15.5)
WBC: 7.4 10*3/uL (ref 4.0–10.5)
nRBC: 0 % (ref 0.0–0.2)

## 2021-05-12 LAB — RESP PANEL BY RT-PCR (FLU A&B, COVID) ARPGX2
Influenza A by PCR: NEGATIVE
Influenza B by PCR: NEGATIVE
SARS Coronavirus 2 by RT PCR: NEGATIVE

## 2021-05-12 LAB — LIPASE, BLOOD: Lipase: 64 U/L — ABNORMAL HIGH (ref 11–51)

## 2021-05-12 LAB — MAGNESIUM: Magnesium: 1.9 mg/dL (ref 1.7–2.4)

## 2021-05-12 MED ORDER — ACETAMINOPHEN 325 MG PO TABS
650.0000 mg | ORAL_TABLET | Freq: Four times a day (QID) | ORAL | Status: DC | PRN
  Administered 2021-05-13 – 2021-05-15 (×3): 650 mg via ORAL
  Filled 2021-05-12 (×3): qty 2

## 2021-05-12 MED ORDER — POLYETHYLENE GLYCOL 3350 17 G PO PACK
17.0000 g | PACK | Freq: Every day | ORAL | Status: DC | PRN
  Administered 2021-05-18 – 2021-05-19 (×2): 17 g via ORAL
  Filled 2021-05-12 (×2): qty 1

## 2021-05-12 MED ORDER — THIAMINE HCL 100 MG PO TABS
100.0000 mg | ORAL_TABLET | Freq: Every day | ORAL | Status: DC
  Administered 2021-05-13 – 2021-05-20 (×8): 100 mg via ORAL
  Filled 2021-05-12 (×8): qty 1

## 2021-05-12 MED ORDER — ONDANSETRON 4 MG PO TBDP
4.0000 mg | ORAL_TABLET | Freq: Four times a day (QID) | ORAL | Status: DC | PRN
  Administered 2021-05-12: 10:00:00 4 mg via ORAL
  Filled 2021-05-12: qty 1

## 2021-05-12 MED ORDER — LORAZEPAM 1 MG PO TABS
1.0000 mg | ORAL_TABLET | ORAL | Status: AC | PRN
  Administered 2021-05-12 – 2021-05-13 (×2): 1 mg via ORAL
  Administered 2021-05-13: 2 mg via ORAL
  Administered 2021-05-14 – 2021-05-15 (×3): 1 mg via ORAL
  Filled 2021-05-12 (×2): qty 1
  Filled 2021-05-12: qty 2
  Filled 2021-05-12 (×4): qty 1

## 2021-05-12 MED ORDER — THIAMINE HCL 100 MG/ML IJ SOLN
100.0000 mg | Freq: Once | INTRAMUSCULAR | Status: AC
  Administered 2021-05-12: 10:00:00 100 mg via INTRAMUSCULAR
  Filled 2021-05-12: qty 2

## 2021-05-12 MED ORDER — FLUOXETINE HCL 20 MG PO CAPS
40.0000 mg | ORAL_CAPSULE | Freq: Every morning | ORAL | Status: DC
  Administered 2021-05-13 – 2021-05-20 (×8): 40 mg via ORAL
  Filled 2021-05-12 (×8): qty 2

## 2021-05-12 MED ORDER — ADULT MULTIVITAMIN W/MINERALS CH
1.0000 | ORAL_TABLET | Freq: Every day | ORAL | Status: DC
  Administered 2021-05-12: 10:00:00 1 via ORAL
  Filled 2021-05-12: qty 1

## 2021-05-12 MED ORDER — FOLIC ACID 1 MG PO TABS
1.0000 mg | ORAL_TABLET | Freq: Every day | ORAL | Status: DC
  Administered 2021-05-12 – 2021-05-20 (×9): 1 mg via ORAL
  Filled 2021-05-12 (×9): qty 1

## 2021-05-12 MED ORDER — THIAMINE HCL 100 MG PO TABS: 100.0000 mg | ORAL_TABLET | Freq: Every day | ORAL | Status: DC

## 2021-05-12 MED ORDER — ENOXAPARIN SODIUM 40 MG/0.4ML IJ SOSY
40.0000 mg | PREFILLED_SYRINGE | INTRAMUSCULAR | Status: DC
  Administered 2021-05-12 – 2021-05-19 (×8): 40 mg via SUBCUTANEOUS
  Filled 2021-05-12 (×8): qty 0.4

## 2021-05-12 MED ORDER — SODIUM CHLORIDE 0.9 % IV SOLN: INTRAVENOUS | Status: AC

## 2021-05-12 MED ORDER — ACETAMINOPHEN 650 MG RE SUPP: 650.0000 mg | Freq: Four times a day (QID) | RECTAL | Status: DC | PRN

## 2021-05-12 MED ORDER — THIAMINE HCL 100 MG/ML IJ SOLN: 100.0000 mg | Freq: Every day | INTRAMUSCULAR | Status: DC

## 2021-05-12 MED ORDER — LORAZEPAM 2 MG/ML IJ SOLN: 1.0000 mg | INTRAMUSCULAR | Status: AC | PRN

## 2021-05-12 MED ORDER — SODIUM CHLORIDE 0.9 % IV BOLUS
1000.0000 mL | Freq: Once | INTRAVENOUS | Status: AC
  Administered 2021-05-12: 11:00:00 1000 mL via INTRAVENOUS

## 2021-05-12 MED ORDER — HYDRALAZINE HCL 25 MG PO TABS: 25.0000 mg | ORAL_TABLET | Freq: Four times a day (QID) | ORAL | Status: DC | PRN

## 2021-05-12 MED ORDER — INFLUENZA VAC A&B SA ADJ QUAD 0.5 ML IM PRSY
0.5000 mL | PREFILLED_SYRINGE | INTRAMUSCULAR | Status: AC
  Administered 2021-05-13: 0.5 mL via INTRAMUSCULAR
  Filled 2021-05-12: qty 0.5

## 2021-05-12 MED ORDER — LORAZEPAM 1 MG PO TABS
1.0000 mg | ORAL_TABLET | Freq: Four times a day (QID) | ORAL | Status: DC | PRN
  Administered 2021-05-12: 10:00:00 1 mg via ORAL
  Filled 2021-05-12: qty 1

## 2021-05-12 MED ORDER — LEVETIRACETAM 500 MG PO TABS
500.0000 mg | ORAL_TABLET | Freq: Two times a day (BID) | ORAL | Status: DC
  Administered 2021-05-12 – 2021-05-20 (×16): 500 mg via ORAL
  Filled 2021-05-12 (×16): qty 1

## 2021-05-12 MED ORDER — ADULT MULTIVITAMIN W/MINERALS CH
1.0000 | ORAL_TABLET | Freq: Every day | ORAL | Status: DC
  Administered 2021-05-13 – 2021-05-20 (×8): 1 via ORAL
  Filled 2021-05-12 (×8): qty 1

## 2021-05-12 MED ORDER — PROCHLORPERAZINE EDISYLATE 10 MG/2ML IJ SOLN
10.0000 mg | Freq: Four times a day (QID) | INTRAMUSCULAR | Status: DC | PRN
  Administered 2021-05-13: 10 mg via INTRAVENOUS
  Filled 2021-05-12: qty 2

## 2021-05-12 NOTE — ED Triage Notes (Signed)
Pt. BIB [facility]. Pt. Experienced a witnessed seizure lasting about 2 minutes. She was postictal.Last seizure was about 2 years ago during alcohol withdrawal. EMS states that pt. Has been on an alcohol binge for about a month.Last consumption of alcohol per pt. "Day before yesterday."  EMS VS: HR:130 RR:30 O2: 96% RA BP: 142/87 CBG: 187

## 2021-05-12 NOTE — TOC Initial Note (Signed)
Transition of Care [facility]) - Initial/Assessment Note    Patient Details  Name: Ashley Bradley MRN: [id number] Date of Birth: 06-05-1951  [facility]) CM/SW Contact:    Ashley Chars, LCSW Phone Number: 05/12/2021, 6:15 PM  Clinical Narrative:  CSW met with pt for initial assessment and Cage Aid. (See separate note)  Pt from home with husband Ashley Bradley, permission given to speak with husband.  PCP in place.  Current DME in home: wheelchair, walker, shower chair.  Pt is vaccinated for covid but not boosted.                   Expected Discharge Plan:  (TBD) Barriers to Discharge: Continued Medical Work up   Patient Goals and CMS Choice Patient states their goals for this hospitalization and ongoing recovery are:: "get back on my feet"      Expected Discharge Plan and Services Expected Discharge Plan:  (TBD) In-house Referral: Clinical Social Work     Living arrangements for the past 2 months: Single Family Home                                      Prior Living Arrangements/Services Living arrangements for the past 2 months: Single Family Home Lives with:: Spouse Patient language and need for interpreter reviewed:: Yes Do you feel safe going back to the place where you live?: Yes      Need for Family Participation in Patient Care: No (Comment) Care giver support system in place?: Yes (comment) Current home services: Other (comment) (none) Criminal Activity/Legal Involvement Pertinent to Current Situation/Hospitalization: No - Comment as needed  Activities of Daily Living Home Assistive Devices/Equipment: Walker (specify type) ADL Screening (condition at time of admission) Patient's cognitive ability adequate to safely complete daily activities?: Yes Is the patient deaf or have difficulty hearing?: No Does the patient have difficulty seeing, even when wearing glasses/contacts?: No Does the patient have difficulty concentrating, remembering, or making  decisions?: No Patient able to express need for assistance with ADLs?: Yes Does the patient have difficulty dressing or bathing?: No Independently performs ADLs?: No Communication: Independent Dressing (OT): Independent Grooming: Independent Feeding: Independent Bathing: Independent Toileting: Needs assistance Is this a change from baseline?: Change from baseline, expected to last <3 days In/Out Bed: Needs assistance Is this a change from baseline?: Change from baseline, expected to last <3 days Walks in Home: Needs assistance Is this a change from baseline?: Change from baseline, expected to last <3 days Does the patient have difficulty walking or climbing stairs?: Yes Weakness of Legs: Both Weakness of Arms/Hands: Both  Permission Sought/Granted Permission sought to share information with : Family Supports Permission granted to share information with : Yes, Verbal Permission Granted  Share Information with NAME: husband Ashley Bradley           Emotional Assessment Appearance:: Appears stated age Attitude/Demeanor/Rapport: Engaged Affect (typically observed): Appropriate, Pleasant Orientation: :  (not recorded, appears oriented) Alcohol / Substance Use: Alcohol Use Psych Involvement: No (comment)  Admission diagnosis:  Alcohol withdrawal seizure ([place]) [C16.384, R56.9] Patient Active Problem List   Diagnosis Date Noted   Essential hypertension 05/12/2021   Transaminitis 05/12/2021   Total bilirubin, elevated 05/12/2021   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[place]) 04/15/2020   H/O febrile seizure 04/15/2020   DTs (delirium tremens) ([place]) 01/24/2020   Alcohol withdrawal seizure ([place]) 01/24/2020   High anion gap metabolic acidosis  10/13/2017   Melena 06/29/2016   Syncope 01/26/2016   Prolonged Q-T interval on ECG 01/26/2016   History of Clostridium difficile colitis 01/26/2016   Elevated troponin    Alcohol use disorder, moderate, in early remission, dependence ([place]) 01/21/2016    AKI (acute kidney injury) ([place]) 01/16/2016   Seizure disorder ([place]) 09/24/2013   Hyponatremia 09/20/2013   Major depression 03/19/2013   Alcohol intoxication in relapsed alcoholic ([place]) 26/41/5830   PCP:  Ashley Loader, FNP Pharmacy:   [place], [place] [address] Phone: [phone] Fax: [phone]     Social Determinants of Health (SDOH) Interventions    Readmission Risk Interventions No flowsheet data found.

## 2021-05-12 NOTE — TOC CAGE-AID Note (Signed)
Transition of Care [facility]) - CAGE-AID Screening   Patient Details  Name: Ashley Bradley MRN: [id number] Date of Birth: 1951-04-19  Transition of Care [facility]) CM/SW Contact:    Joanne Chars, LCSW Phone Number: 05/12/2021, 6:10 PM   Clinical Narrative:CSW met with pt to complete Cage Aid.  Pt reports current daily drinking of 3 bottles of wine since September.  Pt reports she is recovering alcoholic and was sober 7 years prior to this relapse.  Pt reports that she has been in residential treatment in the past at [facility] and was been active with AA in the past.  Pt is not interested in treatment currently and believes that reengaging with AA meetings will get her back on track.  Pt also reports her husband is also recovering alcoholic and is a good support.  Resource list given.    CAGE-AID Screening:    Have You Ever Felt You Ought to Cut Down on Your Drinking or Drug Use?: Yes Have People Annoyed You By Critizing Your Drinking Or Drug Use?: Yes Have You Felt Bad Or Guilty About Your Drinking Or Drug Use?: Yes Have You Ever Had a Drink or Used Drugs First Thing In The Morning to Steady Your Nerves or to Get Rid of a Hangover?: Yes CAGE-AID Score: 4  Substance Abuse Education Offered: Yes  Substance abuse interventions: Patient Counseling, Other (must comment) Civil engineer, contracting)

## 2021-05-12 NOTE — ED Provider Notes (Signed)
[facility] DEPT Provider Note   CSN: [id number] Arrival date & time: 05/12/21  0859     History Chief Complaint  Patient presents with   Seizures    Ashley Bradley is a 70 y.o. female.  HPI  70 year old female with past medical history of alcoholism, withdrawal seizures, DTs, CP, HTN presents the emergency department for reported seizure.  Per EMS the husband called the ambulance after witnessing an approximately 2-minute long seizure that he described as whole body shaking.  Patient has amnesia in regards to this episode.  Patient was initially postictal but improved.  States that her last seizure was over a year ago and was related to alcohol withdrawal.  Patient admits to drinking significant amount of alcohol over the last month.  Cannot give me an exact time of her last consumption, she believes it was 2 days ago but she is unsure.  Currently she is complaining of nausea and tremors.  Does not believe that she hit her head.  Denies any headache, neck pain.  No recent fever or illness.  Denies any ongoing auditory/visual hallucinations.  No SI/HI.  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   H/O febrile seizure 04/15/2020   DTs (delirium tremens) ([place]) 01/24/2020   Alcohol withdrawal seizure ([place]) 01/24/2020   High anion gap metabolic acidosis [id number]   Melena 06/29/2016   Syncope 01/26/2016   Prolonged Q-T interval on ECG 01/26/2016   History of Clostridium difficile colitis 01/26/2016   Elevated troponin    Alcohol use disorder, moderate, in early remission, dependence ([place]) 01/21/2016   AKI (acute kidney injury) ([place])  01/16/2016   Seizure disorder ([place]) 09/24/2013   Hyponatremia 09/20/2013   Major depression 03/19/2013   Alcohol intoxication in relapsed alcoholic ([place]) [id number]    Past Surgical History:  Procedure Laterality Date   BACK SURGERY     x 3   BLADDER SURGERY     BREAST ENHANCEMENT SURGERY     ESOPHAGOGASTRODUODENOSCOPY (EGD) WITH PROPOFOL N/A 10/21/2017   Procedure: ESOPHAGOGASTRODUODENOSCOPY (EGD) WITH PROPOFOL;  Surgeon: Clarene Essex, MD;  Location: [facility] ENDOSCOPY;  Service: Endoscopy;  Laterality: N/A;   TEE WITHOUT CARDIOVERSION N/A 10/24/2017   Procedure: TRANSESOPHAGEAL ECHOCARDIOGRAM (TEE);  Surgeon: Herminio Commons, MD;  Location: [facility] ENDOSCOPY;  Service: Cardiovascular;  Laterality: N/A;   TUBAL LIGATION       OB History   No obstetric history on file.     Family History  Problem Relation Age of Onset   CAD Mother 58   Heart attack Mother    Lung cancer Father 11   Bone cancer Maternal Grandmother     Social History   Tobacco Use   Smoking status: Never   Smokeless tobacco: Never  Substance Use Topics   Alcohol use: Yes    Comment: No alcohol use since 05/10/21.   Drug use: Never    Home Medications Prior to Admission medications   Medication Sig Start Date End Date Taking? Authorizing Provider  aspirin EC 81 MG tablet Take 81 mg by mouth daily.    [provider]  BIOTIN PO Take 5,000 mcg by mouth daily.    [provider]  CALCIUM PO Take 500 mg by mouth daily.    [provider]  FLUoxetine (PROZAC) 40 MG capsule Take 40 mg by mouth every morning. 10/26/19   [provider]  levETIRAcetam (KEPPRA) 500 MG tablet Take 1 tablet (500 mg total) by mouth 2 (two) times daily. 04/15/20   Penumalli, Glenford Bayley, MD  losartan (COZAAR) 100 MG tablet Take 100 mg by mouth daily.    [provider]  MAGNESIUM PO Take 250 mg by mouth daily.    [provider]  Multiple Vitamin (MULTIVITAMIN WITH MINERALS) TABS tablet  Take 1 tablet by mouth daily. 01/23/16   Barnetta Chapel, MD  saccharomyces boulardii (FLORASTOR) 250 MG capsule Take 250 mg by mouth 2 (two) times daily.    [provider]  VITAMIN D PO Take 2,000 Units by mouth daily.    [provider]    Allergies    Ancef [cefazolin], Pantopaque [iophendylate], Macrodantin [nitrofurantoin], and Morphine and related  Review of Systems   Review of Systems  Constitutional:  Positive for appetite change and fatigue. Negative for chills and fever.  HENT:  Negative for congestion.   Eyes:  Negative for visual disturbance.  Respiratory:  Negative for shortness of breath.   Cardiovascular:  Negative for chest pain.  Gastrointestinal:  Negative for abdominal pain, diarrhea and vomiting.  Genitourinary:  Negative for dysuria.  Musculoskeletal:  Negative for back pain and neck pain.  Skin:  Negative for rash.  Neurological:  Positive for seizures. Negative for headaches.  Psychiatric/Behavioral:  Positive for agitation and confusion. Negative for hallucinations and suicidal ideas.    Physical Exam Updated Vital Signs BP 138/82 (BP Location: Left Arm)   Pulse (!) 120   Temp 98.2 F (36.8 C) (Oral)   Resp 19   Ht 5\' 5"  (1.651 m)   Wt 65.3 kg   SpO2 97%   BMI 23.96 kg/m   Physical Exam Vitals and nursing note reviewed.  HENT:     Head: Normocephalic.     Mouth/Throat:     Mouth: Mucous membranes are moist.  Eyes:     Extraocular Movements: Extraocular movements intact.     Pupils: Pupils are equal, round, and reactive to light.  Cardiovascular:     Rate and Rhythm: Normal rate.  Pulmonary:     Effort: Pulmonary effort is normal. No respiratory distress.  Abdominal:     Palpations: Abdomen is soft.     Tenderness: There is no abdominal tenderness.  Musculoskeletal:     Cervical back: No rigidity or tenderness.  Skin:    General: Skin is warm.  Neurological:     Mental Status: She is alert. Mental status is at  baseline. She is disoriented.  Psychiatric:     Comments: Anxious, tremulous, at times confused but cooperative    ED Results / Procedures / Treatments   Labs (all labs ordered are listed, but only abnormal results are displayed) Labs Reviewed  CBC WITH DIFFERENTIAL/PLATELET  COMPREHENSIVE METABOLIC PANEL  LIPASE, BLOOD  MAGNESIUM    EKG None  Radiology No results found.  Procedures Procedures   Medications Ordered in ED Medications  thiamine (B-1) injection 100 mg (has no administration in time range)  thiamine tablet 100 mg (has no administration in time range)  multivitamin with minerals tablet 1 tablet (has no administration in time range)  LORazepam (ATIVAN) tablet 1 mg (has no  administration in time range)  ondansetron (ZOFRAN-ODT) disintegrating tablet 4 mg (has no administration in time range)    ED Course  I have reviewed the triage vital signs and the nursing notes.  Pertinent labs & imaging results that were available during my care of the patient were reviewed by me and considered in my medical decision making (see chart for details).    MDM Rules/Calculators/A&P                           70 year old female presents emergency department reported seizure-like activity.  Patient has reportedly been without alcohol for last 48 hours, endorses daily binging.  Patient has history of alcohol withdrawal complicated by seizures and DTs.  She has required admission to ICU with Precedex drip before.  Patient is complaining of generalized fatigue.  States that she wants to detox and be sober and pursue rehab.  Blood work shows some mild abnormalities.  She has been placed on CIWA protocol and receiving Ativan.  She feels slightly improved, continues to be tachycardic, vitals are otherwise stable.  Plan to admit for alcohol withdrawal in the setting of history of seizures/DTs.  Patients evaluation and results requires admission for further treatment and care. Patient  agrees with admission plan, offers no new complaints and is stable/unchanged at time of admit.  Final Clinical Impression(s) / ED Diagnoses Final diagnoses:  None    Rx / DC Orders ED Discharge Orders     None        Lorelle Gibbs, DO 05/12/21 1413

## 2021-05-12 NOTE — H&P (Signed)
History and Physical    Ashley Bradley [id number] DOB: Jan 22, 1951 DOA: 05/12/2021  PCP: Soundra Pilon, FNP  Patient coming from: Home via EMS  I have personally briefly reviewed patient's old medical records in [facility]  Chief Complaint: Seizure  HPI: Ashley Bradley is a 70 y.o. female with medical history significant of essential hypertension, depression/anxiety, history of seizure disorder, history of alcohol withdrawal/seizure/DTs, with current alcohol abuse who presents to [facility] ED on 11/9 following witnessed seizure activity by husband at home.  Patient does not recall events leading to EMS at her home.  No family present at bedside this afternoon, but husband provided advance via telephone.  Apparently, patient was resting in her bed, has recently started excessive alcohol use in September due to continued grieving in regards to the passing of her parents.  Has been drinking upwards of 4.5 L of red wine daily.  Husband reports while in her bed this morning, she lost eye contact became stiff with eventual arms flailing which lasted 4-5 minutes.  No loss of bowel or bladder but with tongue biting; which resolved spontaneously.  Patient with significant history of DTs/alcohol withdrawal seizures in the past, even requiring ICU level of care on Precedex drip.  ED Course: Temperature 98.2 F, HR 130, RR 17, BP 124/81, SPO2 99% on room air.  WBC 7.4, hemoglobin 13.7, platelets 166.  Sodium 135, potassium 3.7, chloride 97, CO2 26, BUN 12, creatinine 0.60, glucose 167.  Magnesium 1.9, total bilirubin 2.0, AST 91, ALT 71, lipase 64.  CT head without contrast with no acute intracranial abnormality.  Patient was started on 1 L NS bolus, Ativan, multivitamin, thiamine.  EDP consulted [facility] for further evaluation and management of alcohol withdrawal seizure.  Review of Systems: Constitutional - + Fatigue, No Weight Loss Vision - No impaired vision, decreased visual  acuity Ear/Nose/Mouth/Throat - No decreased hearing, no congestion Respiratory - No shortness of breath, no exertional dyspnea, chronic cough Cardiovascular - No chest pain, no palpitations, no peripheral edema Gastrointestinal - No nausea, no diarrhea, no constipation, Genitourinary - No excessive urination, no urinary incontinence Integumentary - No rashes or concerning skin lesions Neurologic - No numbness, no tingling, no dizziness, no headaches, no confusion or memory loss   Past Medical History:  Diagnosis Date   Alcoholism (HCC)    Complication of anesthesia    hard time waking up   CP (cerebral palsy) (HCC)    Depression    Hypertension    PONV (postoperative nausea and vomiting)    Seizures (HCC)    ETOH induced    Past Surgical History:  Procedure Laterality Date   BACK SURGERY     x 3   BLADDER SURGERY     BREAST ENHANCEMENT SURGERY     ESOPHAGOGASTRODUODENOSCOPY (EGD) WITH PROPOFOL N/A 10/21/2017   Procedure: ESOPHAGOGASTRODUODENOSCOPY (EGD) WITH PROPOFOL;  Surgeon: Vida Rigger, MD;  Location: [facility] ENDOSCOPY;  Service: Endoscopy;  Laterality: N/A;   TEE WITHOUT CARDIOVERSION N/A 10/24/2017   Procedure: TRANSESOPHAGEAL ECHOCARDIOGRAM (TEE);  Surgeon: Laqueta Linden, MD;  Location: [facility] ENDOSCOPY;  Service: Cardiovascular;  Laterality: N/A;   TUBAL LIGATION       reports that she has never smoked. She has never used smokeless tobacco. She reports current alcohol use. She reports that she does not use drugs.  Allergies  Allergen Reactions   Ancef [Cefazolin] Other (See Comments)    Skin blisters, peeling.   Pantopaque [Iophendylate] Other (See Comments)    Aseptic  meningitis.   Macrodantin [Nitrofurantoin] Nausea And Vomiting   Morphine And Related Nausea And Vomiting    Per patient, only has reaction when given IV. Okay to take PO.    Family History  Problem Relation Age of Onset   CAD Mother 89   Heart attack Mother    Lung cancer Father 47   Bone  cancer Maternal Grandmother     Family history reviewed and not pertinent   Prior to Admission medications   Medication Sig Start Date End Date Taking? Authorizing Provider  aspirin EC 81 MG tablet Take 81 mg by mouth daily.   Yes [provider]  atorvastatin (LIPITOR) 10 MG tablet Take 10 mg by mouth daily. 04/05/21  Yes [provider]  BIOTIN PO Take 5,000 mcg by mouth daily.   Yes [provider]  CALCIUM PO Take 500 mg by mouth daily.   Yes [provider]  FLUoxetine (PROZAC) 40 MG capsule Take 40 mg by mouth every morning. 10/26/19  Yes [provider]  levETIRAcetam (KEPPRA) 500 MG tablet Take 1 tablet (500 mg total) by mouth 2 (two) times daily. 04/15/20  Yes Penumalli, Glenford Bayley, MD  losartan (COZAAR) 100 MG tablet Take 50 mg by mouth daily.   Yes [provider]  Multiple Vitamin (MULTIVITAMIN WITH MINERALS) TABS tablet Take 1 tablet by mouth daily. 01/23/16  Yes Barnetta Chapel, MD  saccharomyces boulardii (FLORASTOR) 250 MG capsule Take 250 mg by mouth 2 (two) times daily.   Yes [provider]  VITAMIN D PO Take 2,000 Units by mouth daily.   Yes [provider]    Physical Exam: Vitals:   05/12/21 1230 05/12/21 1300 05/12/21 1330 05/12/21 1400  BP: 125/76 128/76 124/76 124/81  Pulse: (!) 110 (!) 113 (!) 109 (!) 114  Resp: (!) 29 (!) 21 (!) 26 17  Temp:      TempSrc:      SpO2: 94% 98% 95% 99%  Weight:      Height:        Constitutional: NAD, calm, comfortable Eyes: PERRL, lids and conjunctivae normal ENMT: Mucous membranes are moist with small amount of dried blood surrounding lips, posterior pharynx clear of any exudate or lesions.Normal dentition.  Neck: normal, supple, no masses, no thyromegaly Respiratory: clear to auscultation bilaterally, no wheezing, no crackles. Normal respiratory effort. No accessory muscle use.  Cardiovascular: Regular rate and rhythm, no murmurs / rubs / gallops. No  extremity edema. 2+ pedal pulses. No carotid bruits.  Abdomen: no tenderness, no masses palpated. No hepatosplenomegaly. Bowel sounds positive.  Musculoskeletal: no clubbing / cyanosis. No joint deformity upper and lower extremities. Good ROM, no contractures. Normal muscle tone.  Skin: no rashes, lesions, ulcers. No induration Neurologic: CN 2-12 grossly intact. Sensation intact, DTR normal. Strength 5/5 in all 4.  Psychiatric: Normal judgment and insight. Alert and oriented x 3. Normal mood.    Labs on Admission: I have personally reviewed following labs and imaging studies  CBC: Recent Labs  Lab 05/12/21 1012  WBC 7.4  NEUTROABS 6.5  HGB 13.7  HCT 40.5  MCV 98.1  PLT 166   Basic Metabolic Panel: Recent Labs  Lab 05/12/21 1012  NA 135  K 3.7  CL 97*  CO2 26  GLUCOSE 167*  BUN 12  CREATININE 0.40*  CALCIUM 9.2  MG 1.9   GFR: Estimated Creatinine Clearance: 58 mL/min (A) (by C-G formula based on SCr of 0.4 mg/dL (L)). Liver Function Tests:  Recent Labs  Lab 05/12/21 1012  AST 91*  ALT 71*  ALKPHOS 166*  BILITOT 2.0*  PROT 7.6  ALBUMIN 4.4   Recent Labs  Lab 05/12/21 1012  LIPASE 64*   No results for input(s): AMMONIA in the last 168 hours. Coagulation Profile: No results for input(s): INR, PROTIME in the last 168 hours. Cardiac Enzymes: No results for input(s): CKTOTAL, CKMB, CKMBINDEX, TROPONINI in the last 168 hours. BNP (last 3 results) No results for input(s): PROBNP in the last 8760 hours. HbA1C: No results for input(s): HGBA1C in the last 72 hours. CBG: No results for input(s): GLUCAP in the last 168 hours. Lipid Profile: No results for input(s): CHOL, HDL, LDLCALC, TRIG, CHOLHDL, LDLDIRECT in the last 72 hours. Thyroid Function Tests: No results for input(s): TSH, T4TOTAL, FREET4, T3FREE, THYROIDAB in the last 72 hours. Anemia Panel: No results for input(s): VITAMINB12, FOLATE, FERRITIN, TIBC, IRON, RETICCTPCT in the last 72 hours. Urine  analysis:    Component Value Date/Time   COLORURINE YELLOW 01/24/2020 0626   APPEARANCEUR HAZY (A) 01/24/2020 0626   LABSPEC 1.021 01/24/2020 0626   PHURINE 5.0 01/24/2020 0626   GLUCOSEU NEGATIVE 01/24/2020 0626   HGBUR LARGE (A) 01/24/2020 0626   BILIRUBINUR NEGATIVE 01/24/2020 0626   KETONESUR 20 (A) 01/24/2020 0626   PROTEINUR 100 (A) 01/24/2020 0626   UROBILINOGEN 1.0 09/23/2013 0732   NITRITE NEGATIVE 01/24/2020 0626   LEUKOCYTESUR NEGATIVE 01/24/2020 0626    Radiological Exams on Admission: CT Head Wo Contrast  Result Date: 05/12/2021 CLINICAL DATA:  Seizure. EXAM: CT HEAD WITHOUT CONTRAST TECHNIQUE: Contiguous axial images were obtained from the base of the skull through the vertex without intravenous contrast. COMPARISON:  CT head dated January 24, 2020. FINDINGS: Brain: No evidence of acute infarction, hemorrhage, hydrocephalus, extra-axial collection or mass lesion/mass effect. Stable mild atrophy. Vascular: No hyperdense vessel or unexpected calcification. Skull: Normal. Negative for fracture or focal lesion. Sinuses/Orbits: No acute finding. Other: None. IMPRESSION: 1.  No acute intracranial abnormality. Electronically Signed   By: Obie Dredge M.D.   On: 05/12/2021 11:07    EKG: Independently reviewed.   Assessment/Plan Principal Problem:   Alcohol withdrawal seizure (HCC) Active Problems:   Major depression   Seizure disorder (HCC)   Essential hypertension   Transaminitis   Total bilirubin, elevated    EtOH withdrawal seizure  Patient presenting to ED after husband reports witnessed seizure-like activity at home while lying in bed.  Patient recently started to binge drinking with 4.5 L of red wine daily in September.  History of previous DTs.  Has been to AA in the past.  CT head without contrast unrevealing. --Admit inpatient, telemetry --CIWA protocol with symptom triggered Ativan --Resume home Keppra --Multivitamin, thiamine, folic acid --Supportive care,  seizure precautions  Hx seizure disorder --Continue home Keppra 500 mg p.o. twice daily  EtOH use disorder, recurrent Patient endorses drinking 4.5 L red wine daily, starting in September.  Husband reports etiology likely from continued grieving over her parents passing which was several years ago, exacerbated by her sister bringing family pictures recently.  Counseled patient on need for complete cessation.  She states has follow-up with AA in the past. --TOC consult  Transaminitis with elevated total bilirubin Etiology likely secondary to continued EtOH abuse. --UDS, Tylenol/salicylate level --Continue IV fluid hydration --Repeat CMP in the a.m.  Essential hypertension On losartan 100 mg p.o. daily at home.  BP 124/81 currently. --Hold home losartan for now --Hydralazine 25 mg PO q6h PRN  SBP >170 or DBP >110  Anxiety/depression: --Prozac 40 mg p.o. daily  Hyperlipidemia: --Hold atorvastatin in the setting of transaminitis    DVT prophylaxis:   Lovenox Code Status: Full code Family Communication: Updated patient's spouse via telephone this afternoon Disposition Plan: Anticipate discharge home when medically stable Consults called: None Admission status: Inpatient Level of care: Telemetry   Severity of Illness: The appropriate patient status for this patient is INPATIENT. Inpatient status is judged to be reasonable and necessary in order to provide the required intensity of service to ensure the patient's safety. The patient's presenting symptoms, physical exam findings, and initial radiographic and laboratory data in the context of their chronic comorbidities is felt to place them at high risk for further clinical deterioration. Furthermore, it is not anticipated that the patient will be medically stable for discharge from the hospital within 2 midnights of admission. The following factors support the patient status of inpatient.   " The patient's presenting symptoms include  seizure, confusion " The worrisome physical exam findings include tachycardia, tachypnea " The initial radiographic and laboratory data are worrisome because of hyponatremia, transaminitis, elevated bilirubin " The chronic co-morbidities include depression/anxiety, EtOH withdrawal, history of DTs, history of alcohol withdrawal seizures, active alcohol abuse.   * I certify that at the point of admission it is my clinical judgment that the patient will require inpatient hospital care spanning beyond 2 midnights from the point of admission due to high intensity of service, high risk for further deterioration and high frequency of surveillance required.*    Alvira Philips [place] DO [facility] Available via Epic secure chat 7am-7pm After these hours, please refer to coverage provider listed on [email or web address] 05/12/2021, 2:51 PM

## 2021-05-13 DIAGNOSIS — I10 Essential (primary) hypertension: Secondary | ICD-10-CM | POA: Diagnosis not present

## 2021-05-13 DIAGNOSIS — F329 Major depressive disorder, single episode, unspecified: Secondary | ICD-10-CM | POA: Diagnosis not present

## 2021-05-13 DIAGNOSIS — G802 Spastic hemiplegic cerebral palsy: Secondary | ICD-10-CM

## 2021-05-13 DIAGNOSIS — R569 Unspecified convulsions: Secondary | ICD-10-CM | POA: Diagnosis not present

## 2021-05-13 DIAGNOSIS — F10931 Alcohol use, unspecified with withdrawal delirium: Secondary | ICD-10-CM | POA: Diagnosis not present

## 2021-05-13 LAB — SALICYLATE LEVEL: Salicylate Lvl: <7 mg/dL — ABNORMAL LOW (ref 7.0–30.0)

## 2021-05-13 LAB — COMPREHENSIVE METABOLIC PANEL
ALT: 50 U/L — ABNORMAL HIGH (ref 0–44)
AST: 60 U/L — ABNORMAL HIGH (ref 15–41)
Albumin: 3.4 g/dL — ABNORMAL LOW (ref 3.5–5.0)
Alkaline Phosphatase: 137 U/L — ABNORMAL HIGH (ref 38–126)
Anion gap: 7 (ref 5–15)
BUN: 12 mg/dL (ref 8–23)
CO2: 27 mmol/L (ref 22–32)
Calcium: 8.6 mg/dL — ABNORMAL LOW (ref 8.9–10.3)
Chloride: 98 mmol/L (ref 98–111)
Creatinine, Ser: 0.39 mg/dL — ABNORMAL LOW (ref 0.44–1.00)
GFR, Estimated: >60 mL/min (ref >60)
Glucose, Bld: 86 mg/dL (ref 70–99)
Potassium: 2.9 mmol/L — ABNORMAL LOW (ref 3.5–5.1)
Sodium: 132 mmol/L — ABNORMAL LOW (ref 135–145)
Total Bilirubin: 1.2 mg/dL (ref 0.3–1.2)
Total Protein: 6.1 g/dL — ABNORMAL LOW (ref 6.5–8.1)

## 2021-05-13 LAB — MAGNESIUM: Magnesium: 1.7 mg/dL (ref 1.7–2.4)

## 2021-05-13 LAB — PHOSPHORUS: Phosphorus: 2.4 mg/dL — ABNORMAL LOW (ref 2.5–4.6)

## 2021-05-13 LAB — CBC
HCT: 37.1 % (ref 36.0–46.0)
Hemoglobin: 12.1 g/dL (ref 12.0–15.0)
MCH: 33.2 pg (ref 26.0–34.0)
MCHC: 32.6 g/dL (ref 30.0–36.0)
MCV: 101.6 fL — ABNORMAL HIGH (ref 80.0–100.0)
Platelets: 134 10*3/uL — ABNORMAL LOW (ref 150–400)
RBC: 3.65 MIL/uL — ABNORMAL LOW (ref 3.87–5.11)
RDW: 15.4 % (ref 11.5–15.5)
WBC: 5.9 10*3/uL (ref 4.0–10.5)
nRBC: 0 % (ref 0.0–0.2)

## 2021-05-13 LAB — RAPID URINE DRUG SCREEN, HOSP PERFORMED
Amphetamines: NOT DETECTED
Barbiturates: NOT DETECTED
Benzodiazepines: POSITIVE — AB
Cocaine: NOT DETECTED
Opiates: NOT DETECTED
Tetrahydrocannabinol: NOT DETECTED

## 2021-05-13 LAB — ETHANOL: Alcohol, Ethyl (B): <10 mg/dL (ref <10)

## 2021-05-13 LAB — ACETAMINOPHEN LEVEL: Acetaminophen (Tylenol), Serum: <10 ug/mL — ABNORMAL LOW (ref 10–30)

## 2021-05-13 MED ORDER — ENSURE ENLIVE PO LIQD
237.0000 mL | Freq: Three times a day (TID) | ORAL | Status: DC
Start: 2021-05-13 — End: 2021-05-20
  Administered 2021-05-13 – 2021-05-20 (×17): 237 mL via ORAL

## 2021-05-13 MED ORDER — MAGNESIUM SULFATE 2 GM/50ML IV SOLN
2.0000 g | Freq: Once | INTRAVENOUS | Status: AC
  Administered 2021-05-13: 2 g via INTRAVENOUS
  Filled 2021-05-13: qty 50

## 2021-05-13 MED ORDER — POTASSIUM CHLORIDE CRYS ER 20 MEQ PO TBCR
40.0000 meq | EXTENDED_RELEASE_TABLET | ORAL | Status: AC
  Administered 2021-05-13 (×2): 40 meq via ORAL
  Filled 2021-05-13 (×2): qty 2

## 2021-05-13 NOTE — Evaluation (Signed)
Physical Therapy Evaluation Patient Details Name: Ashley Bradley MRN: [id number] DOB: 09-11-1950 Today's Date: 05/13/2021  History of Present Illness  70 y.o. female admited 05/12/21 for EtOH withdrawal seizure. CT negative for acute intracranial abnormalities. PMH significant for essential HTN, depression/anxiety, seizure, hx of alcohol withdrawal, seizure, DTs.  Clinical Impression  Pt admitted with above diagnosis. Pt currently with functional limitations due to the deficits listed below (see PT Problem List). Pt will benefit from skilled PT to increase their independence and safety with mobility to allow discharge to the venue listed below.  Pt reports some new right UE pain and hx of R LE deficits from hx of CP.  Pt assisted to sitting EOB however requiring max assist for bed mobility and unable to maintain upright trunk posture sitting EOB.  Pt with significant forward trunk lean in sitting and reporting dizziness which did not resolve, so pt assisted back to supine.  Vitals upon return to bed: 96/59 mmHg, 92 bpm, 95% SPO2.  NT in room and to tell RN about BP.  Pt reports dizziness resolved with return to supine.  Discussed current assist level and recommendations for SNF upon d/c with pt and spouse.  Pt reluctant to d/c anywhere but home however spouse is not physically able to assist.  If home, pt plans to obtain "lift chair" however also discussed current safety concern with pt's significant forward trunk lean in sitting today.      Recommendations for follow up therapy are one component of a multi-disciplinary discharge planning process, led by the attending physician.  Recommendations may be updated based on patient status, additional functional criteria and insurance authorization.  Follow Up Recommendations Skilled nursing-short term rehab (<3 hours/day)    Assistance Recommended at Discharge Frequent or constant Supervision/Assistance  Functional Status Assessment Patient has had a  recent decline in their functional status and demonstrates the ability to make significant improvements in function in a reasonable and predictable amount of time.  Equipment Recommendations  Hospital bed    Recommendations for Other Services       Precautions / Restrictions Precautions Precautions: Fall Precaution Comments: seizure Restrictions Weight Bearing Restrictions: No      Mobility  Bed Mobility Overal bed mobility: Needs Assistance Bed Mobility: Rolling;Sidelying to Sit;Sit to Supine Rolling: Mod assist Sidelying to sit: Max assist Supine to sit: Mod assist Sit to supine: Max assist   General bed mobility comments: reports tender R UE and not assisting with R UE, pt able to reach over with left arm to assist with rolling however required assist for lower body; increased assist for trunk upright; assist for LEs onto bed; required trendelenberg position for pt to assist with scooting up HOB    Transfers Overall transfer level: Needs assistance   Transfers: Bed to chair/wheelchair/BSC             General transfer comment: pt felt unable, dizziness with sitting which did not resolve, also increased forward trunk lean present    Ambulation/Gait                  Stairs            Wheelchair Mobility    Modified Rankin (Stroke Patients Only)       Balance Overall balance assessment: Needs assistance Sitting-balance support: Bilateral upper extremity supported;Feet supported Sitting balance-Leahy Scale: Poor Sitting balance - Comments: requiring physical assist due to forward trunk lean, pt also reporting dizziness   Standing balance support: Reliant on assistive  device for balance Standing balance-Leahy Scale: Poor                               Pertinent Vitals/Pain Pain Assessment: Faces Faces Pain Scale: Hurts even more Pain Location: right elbow and distal Pain Descriptors / Indicators: Grimacing;Tender Pain  Intervention(s): Repositioned;Monitored during session    Home Living Family/patient expects to be discharged to:: Private residence Living Arrangements: Spouse/significant other Available Help at Discharge: Available 24 hours/day Type of Home: House Home Access: Stairs to enter Entrance Stairs-Rails: Right Entrance Stairs-Number of Steps: 2   Home Layout: Two level;Able to live on main level with bedroom/bathroom Home Equipment: Rolling Walker (2 wheels);Wheelchair - manual;Toilet riser;BSC/3in1 Additional Comments: pt reports she has CP in the R LE. fingers numb in RUE for past 3 weeks, with tightness and painful MCP extension.    Prior Function Prior Level of Function : Needs assist             Mobility Comments: stand pivots to [facility] for past 6 weeks. also reported she was able to walk ADLs Comments: reports husband has been bringing her meals in bed for past 6 weeks.     Hand Dominance   Dominant Hand: Left    Extremity/Trunk Assessment   Upper Extremity Assessment Upper Extremity Assessment: RUE deficits/detail RUE Deficits / Details: RUE MCPs tight and unable to extend to full ROM actively.    Lower Extremity Assessment Lower Extremity Assessment: Generalized weakness;RLE deficits/detail (requiring assist for bil LEs with bed mobility) RLE Deficits / Details: reports hx of CP with R LE deficits    Cervical / Trunk Assessment Cervical / Trunk Assessment: Normal  Communication   Communication: No difficulties  Cognition Arousal/Alertness: Awake/alert Behavior During Therapy: WFL for tasks assessed/performed Overall Cognitive Status: Within Functional Limits for tasks assessed                                          General Comments      Exercises     Assessment/Plan    PT Assessment Patient needs continued PT services  PT Problem List Decreased strength;Decreased coordination;Decreased mobility;Decreased balance;Decreased activity  tolerance;Decreased knowledge of use of DME       PT Treatment Interventions DME instruction;Gait training;Balance training;Therapeutic exercise;Functional mobility training;Therapeutic activities;Patient/family education;Wheelchair mobility training    PT Goals (Current goals can be found in the Care Plan section)  Acute Rehab PT Goals PT Goal Formulation: With patient/family Time For Goal Achievement: 05/27/21 Potential to Achieve Goals: Fair    Frequency Min 2X/week   Barriers to discharge        Co-evaluation               AM-PAC PT "6 Clicks" Mobility  Outcome Measure Help needed turning from your back to your side while in a flat bed without using bedrails?: Total Help needed moving from lying on your back to sitting on the side of a flat bed without using bedrails?: Total Help needed moving to and from a bed to a chair (including a wheelchair)?: Total Help needed standing up from a chair using your arms (e.g., wheelchair or bedside chair)?: Total Help needed to walk in hospital room?: Total Help needed climbing 3-5 steps with a railing? : Total 6 Click Score: 6    End of Session Equipment Utilized During Treatment:  Gait belt Activity Tolerance: Other (comment) (limited by dizziness, low BP upon returning to bed) Patient left: in bed;with call bell/phone within reach;with family/visitor present Nurse Communication: Mobility status PT Visit Diagnosis: Other abnormalities of gait and mobility (R26.89);Muscle weakness (generalized) (M62.81)    Time: 5681-2751 PT Time Calculation (min) (ACUTE ONLY): 18 min   Charges:   PT Evaluation $PT Eval Moderate Complexity: 1 Mod        Kati PT, DPT Acute Rehabilitation Services Pager: [phone] Office: [phone]   Janan Halter P[place] 05/13/2021, 3:35 PM

## 2021-05-13 NOTE — Progress Notes (Signed)
PROGRESS NOTE    Ashley Bradley  EXB:[id number] DOB: [birth year] DOA: 05/12/2021 PCP: Soundra Pilon, FNP    Brief Narrative:  69 year old female with a history of hypertension, depression, seizure disorder, alcohol abuse, admitted to the hospital with seizure.  It was felt that patient likely had alcohol withdrawal seizure.  She was continued on her home dose of Keppra, started on CIWA protocol, started on IV fluids.   Assessment & Plan:   Principal Problem:   Alcohol withdrawal seizure (HCC) Active Problems:   Major depression   Seizure disorder (HCC)   Essential hypertension   Transaminitis   Total bilirubin, elevated   Alcohol withdrawal seizure -Currently on CIWA protocol -She is chronically on Keppra, has been reports compliance -No evidence of further seizure since admission -Continue CIWA protocol with Ativan  Alcohol abuse -Seen by [facility], patient is not interested in residential alcohol rehab program -She does plan to connect with AA after discharge  Transaminitis -Likely related to alcohol intake -Holding statin for now  Hypertension -Chronically on losartan, hold for now due to borderline blood pressures  Depression -Continue on Prozac  Cerebral palsy -Chronic right lower extremity weakness  Generalized weakness -Seen by physical therapy with recommendations for skilled nursing facility placement -Her husband said that approximately a month ago she was ambulating independently -Generalized weakness progressed over the past month since she was mostly laying in bed and drinking -We will need to discuss with patient to see if she is agreeable   DVT prophylaxis: enoxaparin (LOVENOX) injection 40 mg Start: 05/12/21 2200  Code Status: Full code Family Communication: Updated patient's husband over the phone Disposition Plan: Status is: Inpatient  Remains inpatient appropriate because: Continued management of alcohol withdrawal     Consultants:     Procedures:    Antimicrobials:      Subjective: Appeared to be more tremulous this morning Per record, during my evaluation, she wakes up to voice, denies any complaints  Objective: Vitals:   05/13/21 0500 05/13/21 0913 05/13/21 0933 05/13/21 1506  BP: 122/72 125/79 101/63 (!) 96/59  Pulse: 98 (!) 116 (!) 107 95  Resp: 14 20 20 18   Temp: 98.2 F (36.8 C) 98.5 F (36.9 C) 98.5 F (36.9 C) 98.7 F (37.1 C)  TempSrc: Oral Oral Oral Oral  SpO2: 98% 96% 94% 94%  Weight:      Height:        Intake/Output Summary (Last 24 hours) at 05/13/2021 1924 Last data filed at 05/13/2021 1600 Gross per 24 hour  Intake 1443.59 ml  Output --  Net 1443.59 ml   Filed Weights   05/12/21 0919 05/12/21 2036  Weight: 65.3 kg 65.7 kg    Examination:  General exam: Appears calm and comfortable  Respiratory system: Clear to auscultation. Respiratory effort normal. Cardiovascular system: S1 & S2 heard, RRR. No JVD, murmurs, rubs, gallops or clicks. No pedal edema. Gastrointestinal system: Abdomen is nondistended, soft and nontender. No organomegaly or masses felt. Normal bowel sounds heard. Central nervous system: Alert and oriented. No focal neurological deficits. Extremities: Chronic right upper and lower extremity weakness Skin: No rashes, lesions or ulcers Psychiatry: Judgement and insight appear normal. Mood & affect appropriate.     Data Reviewed: I have personally reviewed following labs and imaging studies  CBC: Recent Labs  Lab 05/12/21 1012 05/13/21 0440  WBC 7.4 5.9  NEUTROABS 6.5  --   HGB 13.7 12.1  HCT 40.5 37.1  MCV 98.1 101.6*  PLT 166 134*  Basic Metabolic Panel: Recent Labs  Lab 05/12/21 1012 05/13/21 0440  NA 135 132*  K 3.7 2.9*  CL 97* 98  CO2 26 27  GLUCOSE 167* 86  BUN 12 12  CREATININE 0.40* 0.39*  CALCIUM 9.2 8.6*  MG 1.9 1.7  PHOS  --  2.4*   GFR: Estimated Creatinine Clearance: 58 mL/min (A) (by C-G formula based on SCr of 0.39  mg/dL (L)). Liver Function Tests: Recent Labs  Lab 05/12/21 1012 05/13/21 0440  AST 91* 60*  ALT 71* 50*  ALKPHOS 166* 137*  BILITOT 2.0* 1.2  PROT 7.6 6.1*  ALBUMIN 4.4 3.4*   Recent Labs  Lab 05/12/21 1012  LIPASE 64*   No results for input(s): AMMONIA in the last 168 hours. Coagulation Profile: No results for input(s): INR, PROTIME in the last 168 hours. Cardiac Enzymes: No results for input(s): CKTOTAL, CKMB, CKMBINDEX, TROPONINI in the last 168 hours. BNP (last 3 results) No results for input(s): PROBNP in the last 8760 hours. HbA1C: No results for input(s): HGBA1C in the last 72 hours. CBG: No results for input(s): GLUCAP in the last 168 hours. Lipid Profile: No results for input(s): CHOL, HDL, LDLCALC, TRIG, CHOLHDL, LDLDIRECT in the last 72 hours. Thyroid Function Tests: No results for input(s): TSH, T4TOTAL, FREET4, T3FREE, THYROIDAB in the last 72 hours. Anemia Panel: No results for input(s): VITAMINB12, FOLATE, FERRITIN, TIBC, IRON, RETICCTPCT in the last 72 hours. Sepsis Labs: No results for input(s): PROCALCITON, LATICACIDVEN in the last 168 hours.  Recent Results (from the past 240 hour(s))  Resp Panel by RT-PCR (Flu A&B, Covid) Nasopharyngeal Swab     Status: None   Collection Time: 05/12/21  2:26 PM   Specimen: Nasopharyngeal Swab; Nasopharyngeal(NP) swabs in vial transport medium  Result Value Ref Range Status   SARS Coronavirus 2 by RT PCR NEGATIVE NEGATIVE Final    Comment: (NOTE) SARS-CoV-2 target nucleic acids are NOT DETECTED.  The SARS-CoV-2 RNA is generally detectable in upper respiratory specimens during the acute phase of infection. The lowest concentration of SARS-CoV-2 viral copies this assay can detect is 138 copies/mL. A negative result does not preclude SARS-Cov-2 infection and should not be used as the sole basis for treatment or other patient management decisions. A negative result may occur with  improper specimen  collection/handling, submission of specimen other than nasopharyngeal swab, presence of viral mutation(s) within the areas targeted by this assay, and inadequate number of viral copies(<138 copies/mL). A negative result must be combined with clinical observations, patient history, and epidemiological information. The expected result is Negative.  Fact Sheet for Patients:  BloggerCourse.com  Fact Sheet for Healthcare Providers:  SeriousBroker.it  This test is no t yet approved or cleared by the [place] FDA and  has been authorized for detection and/or diagnosis of SARS-CoV-2 by FDA under an Emergency Use Authorization (EUA). This EUA will remain  in effect (meaning this test can be used) for the duration of the COVID-19 declaration under Section 564(b)(1) of the Act, 21 U.S.C.section 360bbb-3(b)(1), unless the authorization is terminated  or revoked sooner.       Influenza A by PCR NEGATIVE NEGATIVE Final   Influenza B by PCR NEGATIVE NEGATIVE Final    Comment: (NOTE) The Xpert Xpress SARS-CoV-2/FLU/RSV plus assay is intended as an aid in the diagnosis of influenza from Nasopharyngeal swab specimens and should not be used as a sole basis for treatment. Nasal washings and aspirates are unacceptable for Xpert Xpress SARS-CoV-2/FLU/RSV testing.  Fact Sheet  for Patients: BloggerCourse.com  Fact Sheet for Healthcare Providers: SeriousBroker.it  This test is not yet approved or cleared by the [place] FDA and has been authorized for detection and/or diagnosis of SARS-CoV-2 by FDA under an Emergency Use Authorization (EUA). This EUA will remain in effect (meaning this test can be used) for the duration of the COVID-19 declaration under Section 564(b)(1) of the Act, 21 U.S.C. section 360bbb-3(b)(1), unless the authorization is terminated or revoked.  Performed at [facility], [address] [place], [place] [address]          Radiology Studies: CT Head Wo Contrast  Result Date: 05/12/2021 CLINICAL DATA:  Seizure. EXAM: CT HEAD WITHOUT CONTRAST TECHNIQUE: Contiguous axial images were obtained from the base of the skull through the vertex without intravenous contrast. COMPARISON:  CT head dated January 24, 2020. FINDINGS: Brain: No evidence of acute infarction, hemorrhage, hydrocephalus, extra-axial collection or mass lesion/mass effect. Stable mild atrophy. Vascular: No hyperdense vessel or unexpected calcification. Skull: Normal. Negative for fracture or focal lesion. Sinuses/Orbits: No acute finding. Other: None. IMPRESSION: 1.  No acute intracranial abnormality. Electronically Signed   By: Obie Dredge M.D.   On: 05/12/2021 11:07        Scheduled Meds:  enoxaparin (LOVENOX) injection  40 mg Subcutaneous Q24H   feeding supplement  237 mL Oral TID BM   FLUoxetine  40 mg Oral q morning   folic acid  1 mg Oral Daily   levETIRAcetam  500 mg Oral BID   multivitamin with minerals  1 tablet Oral Daily   thiamine  100 mg Oral Daily   Or   thiamine  100 mg Intravenous Daily   Continuous Infusions:  sodium chloride 75 mL/hr at 05/13/21 1144     LOS: 1 day    Time spent:    Erick Blinks, MD [facility]   If 7PM-7AM, please contact night-coverage [email or web address]  05/13/2021, 7:24 PM

## 2021-05-13 NOTE — Evaluation (Signed)
Occupational Therapy Evaluation Patient Details Name: Ashley Bradley MRN: [id number] DOB: 17-May-1951 Today's Date: 05/13/2021   History of Present Illness 70 y.o. female admited after husband witnessed siezure activity at home. CT negative for acute intracranial abnormalities. PMH significant for essential HTN, depression/anxiety, seizure, hx of alcohol withdrawal, seizure, DTs.   Clinical Impression   PTA, pt was living at home with husband and reports that she was requiring assistance with ADLs and husband was bringing her meals in bed for past 6 weeks due to increased depression symptoms and heavy drinking. Pt also reports she was stand pivoting to [facility] beside bed independently. Upon evaluation, pt with decreased functional strength, balance, and activity tolerance limiting functional abilities. Pt currently requires set up for UB ADLs, Max A +2 for LB ADLs, and Mod A+2 for squat pivot to recliner. Patient will benefit from skilled OT services while in hospital to improve deficits and learn compensatory strategies as needed in order to return to PLOF. Due to current level of function and level of caregiver assistance, recommending SNF rehab post-discharge. Pt will likely decline, as she has had a negative experience with a SNF in the past. Educated on safety and need for SNF post-discharge at current functional level, pt and caregiver verbalized understanding.      Recommendations for follow up therapy are one component of a multi-disciplinary discharge planning process, led by the attending physician.  Recommendations may be updated based on patient status, additional functional criteria and insurance authorization.   Follow Up Recommendations  Skilled nursing-short term rehab (<3 hours/day)    Assistance Recommended at Discharge    Functional Status Assessment  Patient has had a recent decline in their functional status and demonstrates the ability to make significant improvements in  function in a reasonable and predictable amount of time.  Equipment Recommendations  Other (comment) (TBD)    Recommendations for Other Services       Precautions / Restrictions Restrictions Weight Bearing Restrictions: No      Mobility Bed Mobility Overal bed mobility: Needs Assistance Bed Mobility: Supine to Sit     Supine to sit: Mod assist     General bed mobility comments: Required physical assist to push up into sitting and scoot to EOB.    Transfers Overall transfer level: Needs assistance   Transfers: Bed to chair/wheelchair/BSC             General transfer comment: pivot to recliner, physical assist for power up, pivoting, and guiding down into chair.      Balance Overall balance assessment: Needs assistance Sitting-balance support: Feet supported Sitting balance-Leahy Scale: Fair     Standing balance support: Reliant on assistive device for balance Standing balance-Leahy Scale: Poor                             ADL either performed or assessed with clinical judgement   ADL Overall ADL's : Needs assistance/impaired Eating/Feeding: Set up;Sitting   Grooming: Set up;Sitting   Upper Body Bathing: Sitting;Set up   Lower Body Bathing: Sit to/from stand;+2 for physical assistance;+2 for safety/equipment;Maximal assistance   Upper Body Dressing : Set up;Sitting   Lower Body Dressing: Maximal assistance;Sit to/from stand;+2 for physical assistance;+2 for safety/equipment   Toilet Transfer: BSC/3in1;+2 for physical assistance;+2 for safety/equipment;Moderate assistance;Squat-pivot   Toileting- Clothing Manipulation and Hygiene: Maximal assistance;+2 for physical assistance;+2 for safety/equipment;Sit to/from stand       Functional mobility during ADLs: +2 for  physical assistance;+2 for safety/equipment;Moderate assistance       Vision Patient Visual Report: No change from baseline       Perception     Praxis      Pertinent  Vitals/Pain Pain Assessment: Faces Faces Pain Scale: Hurts little more Pain Location: R hand-MCPs with extension Pain Descriptors / Indicators: Grimacing Pain Intervention(s): Monitored during session     Hand Dominance Left   Extremity/Trunk Assessment Upper Extremity Assessment Upper Extremity Assessment: RUE deficits/detail RUE Deficits / Details: RUE MCPs tight and unable to extend to full ROM actively.   Lower Extremity Assessment Lower Extremity Assessment: Defer to PT evaluation   Cervical / Trunk Assessment Cervical / Trunk Assessment: Normal   Communication Communication Communication: No difficulties   Cognition Arousal/Alertness: Awake/alert Behavior During Therapy: WFL for tasks assessed/performed Overall Cognitive Status: Within Functional Limits for tasks assessed                                       General Comments       Exercises     Shoulder Instructions      Home Living Family/patient expects to be discharged to:: Private residence Living Arrangements: Spouse/significant other Available Help at Discharge: Available 24 hours/day Type of Home: House Home Access: Stairs to enter [employer] of Steps: 2 Entrance Stairs-Rails: Right Home Layout: Two level;Able to live on main level with bedroom/bathroom     Bathroom Shower/Tub: Arts development officer Toilet: Handicapped height     Home Equipment: Agricultural consultant (2 wheels);Wheelchair - manual;Toilet riser;BSC/3in1   Additional Comments: pt reports she has CP in the R foot. fingers numb in RUE for past 3 weeks, with tightness and painful MCP extension.      Prior Functioning/Environment Prior Level of Function : Needs assist             Mobility Comments: stand pivots to [facility] for past 6 weeks. ADLs Comments: reports husband has been bringing her meals in bed for past 6 weeks.        OT Problem List: Decreased strength;Decreased activity tolerance;Impaired  balance (sitting and/or standing);Decreased knowledge of use of DME or AE;Decreased safety awareness      OT Treatment/Interventions: Self-care/ADL training;Therapeutic exercise;DME and/or AE instruction;Therapeutic activities;Patient/family education;Balance training    OT Goals(Current goals can be found in the care plan section) Acute Rehab OT Goals Patient Stated Goal: to go home OT Goal Formulation: With patient Time For Goal Achievement: 05/27/21 Potential to Achieve Goals: Fair  OT Frequency: Min 2X/week   Barriers to D/C:            Co-evaluation              AM-PAC OT "6 Clicks" Daily Activity     Outcome Measure Help from another person eating meals?: A Lot Help from another person taking care of personal grooming?: A Little Help from another person toileting, which includes using toliet, bedpan, or urinal?: A Lot Help from another person bathing (including washing, rinsing, drying)?: A Lot Help from another person to put on and taking off regular upper body clothing?: A Little Help from another person to put on and taking off regular lower body clothing?: A Lot 6 Click Score: 14   End of Session Equipment Utilized During Treatment: Gait belt Nurse Communication: Mobility status  Activity Tolerance: Patient tolerated treatment well Patient left: in chair;with call bell/phone within  reach;with chair alarm set  OT Visit Diagnosis: Muscle weakness (generalized) (M62.81);Unsteadiness on feet (R26.81);Other abnormalities of gait and mobility (R26.89)                Time: 5465-0354 OT Time Calculation (min): 34 min Charges:  OT General Charges $OT Visit: 1 Visit OT Evaluation $OT Eval Low Complexity: 1 Low OT Treatments $Therapeutic Activity: 8-22 mins  Prudence Davidson, OTS Acute Rehab Office: [phone]   Barri Neidlinger 05/13/2021, 12:37 PM

## 2021-05-13 NOTE — Progress Notes (Signed)
Initial Nutrition Assessment  DOCUMENTATION CODES:  Not applicable  INTERVENTION:  Add Ensure Plus High Protein po TID, each supplement provides 350 kcal and 20 grams of protein.   Continue CIWA protocol.  Encourage PO and supplement intake.  NUTRITION DIAGNOSIS:  Increased nutrient needs related to chronic illness (EtOH abuse) as evidenced by estimated needs.  GOAL:  Patient will meet greater than or equal to 90% of their needs  MONITOR:  PO intake, Supplement acceptance, Labs, Weight trends, I & O's  REASON FOR ASSESSMENT:  Malnutrition Screening Tool    ASSESSMENT:  70 yo female with a PMH of essential hypertension, depression/anxiety, history of seizure disorder, history of alcohol withdrawal/seizure/DTs, with current alcohol abuse who presents to [facility] ED on 11/9 following witnessed seizure activity by husband at home.  RD working remotely.  Per H&P, "Apparently, patient was resting in her bed, has recently started excessive alcohol use in September due to continued grieving in regards to the passing of her parents. Has been drinking upwards of 4.5 L of red wine daily."   No recent weight history in Epic. Pt's weight appears to be around ~144 lbs, which is what she weighed on admission. Per Care Everywhere, pt weighed 144 lbs in both July and August 2022 as well.  Pt at increased risk for developing malnutrition given heavy EtOH abuse. Pt likely malnourished to some degree, but RD cannot definitively diagnose at this time.   Recommend Ensure TID and continuing CIWA protocol.  Medications: reviewed; folic acid, Keppra BID, MVI with minerals, Klor-Con 40 mEq, thiamine, NaCl @ 75 ml/hr, Ativan PRN (given once today), Compazine PRN per IV (given once today)  Labs: reviewed; Na 132 (L), K 2.9 (L - repletion in progress), Phos 2.4 (L),   NUTRITION - FOCUSED PHYSICAL EXAM: Unable to perform - defer to follow-up  Diet Order:   Diet Order             Diet Heart Room service  appropriate? Yes with Assist; Fluid consistency: Thin  Diet effective now                  EDUCATION NEEDS:  No education needs have been identified at this time  Skin:  Skin Assessment: Reviewed RN Assessment  Last BM:  05/11/21  Height:  Ht Readings from Last 1 Encounters:  05/12/21 5\' 5"  (1.651 m)   Weight:  Wt Readings from Last 1 Encounters:  05/12/21 65.7 kg   BMI:  Body mass index is 24.1 kg/m.  Estimated Nutritional Needs:  Kcal:  1800-2000 Protein:  85-100 grams Fluid: >1.8 L  13/09/22, RD, LDN (she/her/hers) Clinical Inpatient Dietitian RD Pager/After-Hours/Weekend Pager # in [place]

## 2021-05-14 ENCOUNTER — Inpatient Hospital Stay (HOSPITAL_COMMUNITY): Payer: Medicare Other

## 2021-05-14 DIAGNOSIS — R569 Unspecified convulsions: Secondary | ICD-10-CM | POA: Diagnosis not present

## 2021-05-14 DIAGNOSIS — F10931 Alcohol use, unspecified with withdrawal delirium: Secondary | ICD-10-CM | POA: Diagnosis not present

## 2021-05-14 DIAGNOSIS — F329 Major depressive disorder, single episode, unspecified: Secondary | ICD-10-CM | POA: Diagnosis not present

## 2021-05-14 DIAGNOSIS — I10 Essential (primary) hypertension: Secondary | ICD-10-CM | POA: Diagnosis not present

## 2021-05-14 LAB — CBC
HCT: 35.9 % — ABNORMAL LOW (ref 36.0–46.0)
Hemoglobin: 11.9 g/dL — ABNORMAL LOW (ref 12.0–15.0)
MCH: 33.8 pg (ref 26.0–34.0)
MCHC: 33.1 g/dL (ref 30.0–36.0)
MCV: 102 fL — ABNORMAL HIGH (ref 80.0–100.0)
Platelets: 126 10*3/uL — ABNORMAL LOW (ref 150–400)
RBC: 3.52 MIL/uL — ABNORMAL LOW (ref 3.87–5.11)
RDW: 14.9 % (ref 11.5–15.5)
WBC: 5.9 10*3/uL (ref 4.0–10.5)
nRBC: 0 % (ref 0.0–0.2)

## 2021-05-14 LAB — COMPREHENSIVE METABOLIC PANEL
ALT: 63 U/L — ABNORMAL HIGH (ref 0–44)
AST: 99 U/L — ABNORMAL HIGH (ref 15–41)
Albumin: 2.9 g/dL — ABNORMAL LOW (ref 3.5–5.0)
Alkaline Phosphatase: 137 U/L — ABNORMAL HIGH (ref 38–126)
Anion gap: 6 (ref 5–15)
BUN: 13 mg/dL (ref 8–23)
CO2: 24 mmol/L (ref 22–32)
Calcium: 8 mg/dL — ABNORMAL LOW (ref 8.9–10.3)
Chloride: 104 mmol/L (ref 98–111)
Creatinine, Ser: 0.33 mg/dL — ABNORMAL LOW (ref 0.44–1.00)
GFR, Estimated: >60 mL/min (ref >60)
Glucose, Bld: 105 mg/dL — ABNORMAL HIGH (ref 70–99)
Potassium: 3.4 mmol/L — ABNORMAL LOW (ref 3.5–5.1)
Sodium: 134 mmol/L — ABNORMAL LOW (ref 135–145)
Total Bilirubin: 0.9 mg/dL (ref 0.3–1.2)
Total Protein: 5.5 g/dL — ABNORMAL LOW (ref 6.5–8.1)

## 2021-05-14 LAB — MAGNESIUM: Magnesium: 1.9 mg/dL (ref 1.7–2.4)

## 2021-05-14 LAB — PHOSPHORUS
Phosphorus: 1.7 mg/dL — ABNORMAL LOW (ref 2.5–4.6)
Phosphorus: 5.2 mg/dL — ABNORMAL HIGH (ref 2.5–4.6)

## 2021-05-14 MED ORDER — POTASSIUM PHOSPHATES 15 MMOLE/5ML IV SOLN
45.0000 mmol | Freq: Once | INTRAVENOUS | Status: AC
  Administered 2021-05-14: 45 mmol via INTRAVENOUS
  Filled 2021-05-14: qty 15

## 2021-05-14 NOTE — TOC Progression Note (Signed)
Transition of Care [facility]) - Progression Note    Patient Details  Name: ALMAROSA BOHAC MRN: [id number] Date of Birth: [birth year]  Transition of Care [facility]) CM/SW Contact  Golda Acre, RN Phone Number: 05/14/2021, 8:09 AM  Clinical Narrative:    70 year old female with a history of hypertension, depression, seizure disorder, alcohol abuse, admitted to the hospital with seizure.  It was felt that patient likely had alcohol withdrawal seizure.  She was continued on her home dose of Keppra, started on CIWA protocol, started on IV fluids.     Assessment & Plan:   Principal Problem:   Alcohol withdrawal seizure (HCC) Active Problems:   Major depression   Seizure disorder (HCC)   Essential hypertension   Transaminitis   Total bilirubin, elevated  PLAN OF CARE: patient intends to go back home, does not want substance abuse information.  Will go to [employer] Expected Discharge Plan: Home/Self Care Barriers to Discharge: Continued Medical Work up  Expected Discharge Plan and Services Expected Discharge Plan: Home/Self Care In-house Referral: Clinical Social Work     Living arrangements for the past 2 months: Single Family Home                                       Social Determinants of Health (SDOH) Interventions    Readmission Risk Interventions No flowsheet data found.

## 2021-05-14 NOTE — Progress Notes (Signed)
Unable to complete orthostatic vitals as pt was unable to stand up with assistance long enough for completion of BP    Pt in need of physical therapy.

## 2021-05-14 NOTE — Progress Notes (Signed)
PROGRESS NOTE    Ashley Bradley  QIW:[id number] DOB: [birth year] DOA: 05/12/2021 PCP: Soundra Pilon, FNP    Brief Narrative:  70 year old female with a history of hypertension, depression, seizure disorder, alcohol abuse, admitted to the hospital with seizure.  It was felt that patient likely had alcohol withdrawal seizure.  She was continued on her home dose of Keppra, started on CIWA protocol, started on IV fluids.   Assessment & Plan:   Principal Problem:   Alcohol withdrawal seizure (HCC) Active Problems:   Major depression   Seizure disorder (HCC)   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HCC)   Essential hypertension   Transaminitis   Total bilirubin, elevated   Alcohol withdrawal seizure -Currently on CIWA protocol -She is chronically on Keppra, husband reports compliance -No evidence of further seizure since admission -Continue CIWA protocol with Ativan -Per staff, she still having significant tremors requiring lorazepam  Alcohol abuse -Seen by TOC, patient is not interested in residential alcohol rehab program -She does plan to connect with AA after discharge  Transaminitis -Likely related to alcohol intake -Holding statin for now  Hypokalemia/hypophosphatemia -Related to poor p.o. intake -Replace  Hypertension -Chronically on losartan, hold for now due to borderline blood pressures  Depression -Continue on Prozac  Cerebral palsy -Chronic right lower extremity weakness  Right arm pain -She does have reported history of fall, although it is unclear when exactly she fell -Appears to have pain with decreased range of motion in her right elbow and shoulder -We will check x-ray of elbow and shoulder -She also describes intermittent swelling, will check venous Dopplers  Generalized weakness -Seen by physical therapy with recommendations for skilled nursing facility placement -Her husband said that approximately a month ago she was ambulating  independently -Generalized weakness progressed over the past month since she was mostly laying in bed and drinking -Patient is agreeable for skilled nursing facility placement   DVT prophylaxis: enoxaparin (LOVENOX) injection 40 mg Start: 05/12/21 2200  Code Status: Full code Family Communication: Updated patient's husband over the phone Disposition Plan: Status is: Inpatient  Remains inpatient appropriate because: Continued management of alcohol withdrawal     Consultants:    Procedures:    Antimicrobials:      Subjective: Patient was reportedly increasingly tremulous earlier today.  She required Ativan with improvement of her tremors.  Complains of pain in her right arm.  Difficulty raising her arm.  Says it was present prior to admission, although she does not remember having a recent fall.  Objective: Vitals:   05/13/21 2204 05/14/21 0632 05/14/21 0636 05/14/21 1258  BP: 112/65 133/75 (!) 152/89 126/75  Pulse: (!) 110 90 (!) 108 98  Resp: 20 18  (!) 24  Temp: 99.5 F (37.5 C) 99.9 F (37.7 C)  99.5 F (37.5 C)  TempSrc: Oral Oral  Oral  SpO2: 92% 96% 97% 96%  Weight:   68.7 kg   Height:        Intake/Output Summary (Last 24 hours) at 05/14/2021 1750 Last data filed at 05/14/2021 1736 Gross per 24 hour  Intake 2679.62 ml  Output 2900 ml  Net -220.38 ml   Filed Weights   05/12/21 0919 05/12/21 2036 05/14/21 0636  Weight: 65.3 kg 65.7 kg 68.7 kg    Examination:  General exam: Appears calm and comfortable  Respiratory system: Clear to auscultation. Respiratory effort normal. Cardiovascular system: S1 & S2 heard, RRR. No JVD, murmurs, rubs, gallops or clicks. No pedal edema. Gastrointestinal system: Abdomen is  nondistended, soft and nontender. No organomegaly or masses felt. Normal bowel sounds heard. Central nervous system: Alert and oriented. No focal neurological deficits. Extremities: ROM of right upper extremity limited by pain, tender over right  elbow and right shoulder Skin: No rashes, lesions or ulcers Psychiatry: Judgement and insight appear normal. Mood & affect appropriate.     Data Reviewed: I have personally reviewed following labs and imaging studies  CBC: Recent Labs  Lab 05/12/21 1012 05/13/21 0440 05/14/21 0434  WBC 7.4 5.9 5.9  NEUTROABS 6.5  --   --   HGB 13.7 12.1 11.9*  HCT 40.5 37.1 35.9*  MCV 98.1 101.6* 102.0*  PLT 166 134* 126*   Basic Metabolic Panel: Recent Labs  Lab 05/12/21 1012 05/13/21 0440 05/14/21 0434  NA 135 132* 134*  K 3.7 2.9* 3.4*  CL 97* 98 104  CO2 26 27 24   GLUCOSE 167* 86 105*  BUN 12 12 13   CREATININE 0.40* 0.39* 0.33*  CALCIUM 9.2 8.6* 8.0*  MG 1.9 1.7 1.9  PHOS  --  2.4* 1.7*   GFR: Estimated Creatinine Clearance: 62.8 mL/min (A) (by C-G formula based on SCr of 0.33 mg/dL (L)). Liver Function Tests: Recent Labs  Lab 05/12/21 1012 05/13/21 0440 05/14/21 0434  AST 91* 60* 99*  ALT 71* 50* 63*  ALKPHOS 166* 137* 137*  BILITOT 2.0* 1.2 0.9  PROT 7.6 6.1* 5.5*  ALBUMIN 4.4 3.4* 2.9*   Recent Labs  Lab 05/12/21 1012  LIPASE 64*   No results for input(s): AMMONIA in the last 168 hours. Coagulation Profile: No results for input(s): INR, PROTIME in the last 168 hours. Cardiac Enzymes: No results for input(s): CKTOTAL, CKMB, CKMBINDEX, TROPONINI in the last 168 hours. BNP (last 3 results) No results for input(s): PROBNP in the last 8760 hours. HbA1C: No results for input(s): HGBA1C in the last 72 hours. CBG: No results for input(s): GLUCAP in the last 168 hours. Lipid Profile: No results for input(s): CHOL, HDL, LDLCALC, TRIG, CHOLHDL, LDLDIRECT in the last 72 hours. Thyroid Function Tests: No results for input(s): TSH, T4TOTAL, FREET4, T3FREE, THYROIDAB in the last 72 hours. Anemia Panel: No results for input(s): VITAMINB12, FOLATE, FERRITIN, TIBC, IRON, RETICCTPCT in the last 72 hours. Sepsis Labs: No results for input(s): PROCALCITON, LATICACIDVEN in  the last 168 hours.  Recent Results (from the past 240 hour(s))  Resp Panel by RT-PCR (Flu A&B, Covid) Nasopharyngeal Swab     Status: None   Collection Time: 05/12/21  2:26 PM   Specimen: Nasopharyngeal Swab; Nasopharyngeal(NP) swabs in vial transport medium  Result Value Ref Range Status   SARS Coronavirus 2 by RT PCR NEGATIVE NEGATIVE Final    Comment: (NOTE) SARS-CoV-2 target nucleic acids are NOT DETECTED.  The SARS-CoV-2 RNA is generally detectable in upper respiratory specimens during the acute phase of infection. The lowest concentration of SARS-CoV-2 viral copies this assay can detect is 138 copies/mL. A negative result does not preclude SARS-Cov-2 infection and should not be used as the sole basis for treatment or other patient management decisions. A negative result may occur with  improper specimen collection/handling, submission of specimen other than nasopharyngeal swab, presence of viral mutation(s) within the areas targeted by this assay, and inadequate number of viral copies(<138 copies/mL). A negative result must be combined with clinical observations, patient history, and epidemiological information. The expected result is Negative.  Fact Sheet for Patients:  13/09/22  Fact Sheet for Healthcare Providers:  13/09/22  This test is no t  yet approved or cleared by the [place] and  has been authorized for detection and/or diagnosis of SARS-CoV-2 by FDA under an Emergency Use Authorization (EUA). This EUA will remain  in effect (meaning this test can be used) for the duration of the COVID-19 declaration under Section 564(b)(1) of the Act, 21 U.S.C.section 360bbb-3(b)(1), unless the authorization is terminated  or revoked sooner.       Influenza A by PCR NEGATIVE NEGATIVE Final   Influenza B by PCR NEGATIVE NEGATIVE Final    Comment: (NOTE) The Xpert Xpress SARS-CoV-2/FLU/RSV plus assay  is intended as an aid in the diagnosis of influenza from Nasopharyngeal swab specimens and should not be used as a sole basis for treatment. Nasal washings and aspirates are unacceptable for Xpert Xpress SARS-CoV-2/FLU/RSV testing.  Fact Sheet for Patients: BloggerCourse.com  Fact Sheet for Healthcare Providers: SeriousBroker.it  This test is not yet approved or cleared by the [place] FDA and has been authorized for detection and/or diagnosis of SARS-CoV-2 by FDA under an Emergency Use Authorization (EUA). This EUA will remain in effect (meaning this test can be used) for the duration of the COVID-19 declaration under Section 564(b)(1) of the Act, 21 U.S.C. section 360bbb-3(b)(1), unless the authorization is terminated or revoked.  Performed at [facility], [address] [place], [place] [address]          Radiology Studies: No results found.      Scheduled Meds:  enoxaparin (LOVENOX) injection  40 mg Subcutaneous Q24H   feeding supplement  237 mL Oral TID BM   FLUoxetine  40 mg Oral q morning   folic acid  1 mg Oral Daily   levETIRAcetam  500 mg Oral BID   multivitamin with minerals  1 tablet Oral Daily   thiamine  100 mg Oral Daily   Or   thiamine  100 mg Intravenous Daily   Continuous Infusions:  sodium chloride Stopped (05/14/21 1543)     LOS: 2 days    Time spent:    Erick Blinks, MD [facility]   If 7PM-7AM, please contact night-coverage [email or web address]  05/14/2021, 5:50 PM

## 2021-05-15 DIAGNOSIS — I10 Essential (primary) hypertension: Secondary | ICD-10-CM | POA: Diagnosis not present

## 2021-05-15 DIAGNOSIS — R569 Unspecified convulsions: Secondary | ICD-10-CM | POA: Diagnosis not present

## 2021-05-15 DIAGNOSIS — F329 Major depressive disorder, single episode, unspecified: Secondary | ICD-10-CM | POA: Diagnosis not present

## 2021-05-15 DIAGNOSIS — F10931 Alcohol use, unspecified with withdrawal delirium: Secondary | ICD-10-CM | POA: Diagnosis not present

## 2021-05-15 LAB — COMPREHENSIVE METABOLIC PANEL
ALT: 61 U/L — ABNORMAL HIGH (ref 0–44)
AST: 64 U/L — ABNORMAL HIGH (ref 15–41)
Albumin: 3.5 g/dL (ref 3.5–5.0)
Alkaline Phosphatase: 157 U/L — ABNORMAL HIGH (ref 38–126)
Anion gap: 12 (ref 5–15)
BUN: 6 mg/dL — ABNORMAL LOW (ref 8–23)
CO2: 23 mmol/L (ref 22–32)
Calcium: 8.8 mg/dL — ABNORMAL LOW (ref 8.9–10.3)
Chloride: 97 mmol/L — ABNORMAL LOW (ref 98–111)
Creatinine, Ser: <0.3 mg/dL — ABNORMAL LOW (ref 0.44–1.00)
Glucose, Bld: 82 mg/dL (ref 70–99)
Potassium: 3.2 mmol/L — ABNORMAL LOW (ref 3.5–5.1)
Sodium: 132 mmol/L — ABNORMAL LOW (ref 135–145)
Total Bilirubin: 1.6 mg/dL — ABNORMAL HIGH (ref 0.3–1.2)
Total Protein: 6.6 g/dL (ref 6.5–8.1)

## 2021-05-15 LAB — CBC
HCT: 40.6 % (ref 36.0–46.0)
Hemoglobin: 14.1 g/dL (ref 12.0–15.0)
MCH: 34 pg (ref 26.0–34.0)
MCHC: 34.7 g/dL (ref 30.0–36.0)
MCV: 97.8 fL (ref 80.0–100.0)
Platelets: 154 10*3/uL (ref 150–400)
RBC: 4.15 MIL/uL (ref 3.87–5.11)
RDW: 14.2 % (ref 11.5–15.5)
WBC: 4.9 10*3/uL (ref 4.0–10.5)
nRBC: 0 % (ref 0.0–0.2)

## 2021-05-15 LAB — MAGNESIUM: Magnesium: 1.9 mg/dL (ref 1.7–2.4)

## 2021-05-15 LAB — PHOSPHORUS: Phosphorus: 3 mg/dL (ref 2.5–4.6)

## 2021-05-15 MED ORDER — POTASSIUM CHLORIDE CRYS ER 20 MEQ PO TBCR
40.0000 meq | EXTENDED_RELEASE_TABLET | ORAL | Status: AC
  Administered 2021-05-15 (×2): 40 meq via ORAL
  Filled 2021-05-15 (×2): qty 2

## 2021-05-15 MED ORDER — LACTATED RINGERS IV SOLN
INTRAVENOUS | Status: DC
Start: 2021-05-15 — End: 2021-05-16

## 2021-05-15 NOTE — TOC Progression Note (Addendum)
Transition of Care [facility]) - Progression Note    Patient Details  Name: KHALIYA GOLINSKI MRN: [id number] Date of Birth: 1950/10/28  Transition of Care [facility]) CM/SW Contact  Larrie Kass, LCSW Phone Number: 05/15/2021, 10:43 AM  Clinical Narrative:     CSW spoke with pt at bedside, pt agrees with reconnections for SNF placement. Pt stated she would like to go to [facility] or [facility]. P stated she does not want to go back to [facility].  CSW is to complete PASRR and submit it for auth.  Adden 11:20am Pt's [place] Must PASRR went to level 2 review status . CSW to submit documents for review.    Expected Discharge Plan: Home/Self Care Barriers to Discharge: Continued Medical Work up  Expected Discharge Plan and Services Expected Discharge Plan: Home/Self Care In-house Referral: Clinical Social Work     Living arrangements for the past 2 months: Single Family Home                                       Social Determinants of Health (SDOH) Interventions    Readmission Risk Interventions No flowsheet data found.

## 2021-05-15 NOTE — NC FL2 (Signed)
[place] MEDICAID FL2 LEVEL OF CARE SCREENING TOOL     IDENTIFICATION  Patient Name: Ashley Bradley Birthdate: 10-May-1951 Sex: female Admission Date (Current Location): 05/12/2021  [facility] and [place] Number:  Producer, television/film/video and Address:  [facility],  [address] [place] [address]      Provider Number: [id number]  Attending Physician Name and Address:  Erick Blinks, MD  Relative Name and Phone Number:  Moxie, Kalil (Spouse)   [phone]    Current Level of Care: Hospital Recommended Level of Care: Skilled Nursing Facility Prior Approval Number:    Date Approved/Denied:   PASRR Number: Pending  Discharge Plan: SNF    Current Diagnoses: Patient Active Problem List   Diagnosis Date Noted   Essential hypertension 05/12/2021   Transaminitis 05/12/2021   Total bilirubin, elevated 05/12/2021   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/2020   H/O febrile seizure 04/15/2020   DTs (delirium tremens) (HCC) 01/24/2020   Alcohol withdrawal seizure (HCC) 01/24/2020   High anion gap metabolic acidosis 10/13/2017   Melena 06/29/2016   Syncope 01/26/2016   Prolonged Q-T interval on ECG 01/26/2016   History of Clostridium difficile colitis 01/26/2016   Elevated troponin    Alcohol use disorder, moderate, in early remission, dependence (HCC) 01/21/2016   AKI (acute kidney injury) (HCC) 01/16/2016   Seizure disorder (HCC) 09/24/2013   Hyponatremia 09/20/2013   Major depression 03/19/2013   Alcohol intoxication in relapsed alcoholic (HCC) 03/19/2013    Orientation RESPIRATION BLADDER Height & Weight     Self, Time, Situation, Place  Normal Indwelling catheter, Incontinent Weight: 151 lb 7.3 oz (68.7 kg) Height:  5\' 5"  (165.1 cm)  BEHAVIORAL SYMPTOMS/MOOD NEUROLOGICAL BOWEL NUTRITION STATUS    Convulsions/Seizures Continent Diet (regular)  AMBULATORY STATUS COMMUNICATION OF NEEDS Skin   Limited Assist Verbally Normal, Skin abrasions (MASD on  groin and small skin tear at inner distal crease of buttocks)                       Personal Care Assistance Level of Assistance  Bathing, Feeding, Dressing Bathing Assistance: Limited assistance Feeding assistance: Independent Dressing Assistance: Limited assistance     Functional Limitations Info  Sight, Hearing, Speech Sight Info: Adequate Hearing Info: Adequate Speech Info: Adequate    SPECIAL CARE FACTORS FREQUENCY  PT (By licensed PT), OT (By licensed OT)     PT Frequency: 5 x a week OT Frequency: 5 x a week            Contractures Contractures Info: Not present    Additional Factors Info  Code Status, Allergies, Psychotropic Code Status Info: full Allergies Info: Ancef (cefazolin),Pantopaque (iophendylate),Morphine And Related Psychotropic Info: FLUoxetine (PROZAC) capsule 40 mg         Current Medications (05/15/2021):  This is the current hospital active medication list Current Facility-Administered Medications  Medication Dose Route Frequency Provider Last Rate Last Admin   acetaminophen (TYLENOL) tablet 650 mg  650 mg Oral Q6H PRN 13/06/2021, Eric J, DO   650 mg at 05/15/21 13/12/22   Or   acetaminophen (TYLENOL) suppository 650 mg  650 mg Rectal Q6H PRN 4970, [place], DO       enoxaparin (LOVENOX) injection 40 mg  40 mg Subcutaneous Q24H Alvira Philips, [place], DO   40 mg at 05/14/21 2024   feeding supplement (ENSURE ENLIVE / ENSURE PLUS) liquid 237 mL  237 mL Oral TID BM 2025, MD   237 mL at  05/15/21 0852   FLUoxetine (PROZAC) capsule 40 mg  40 mg Oral q morning [place] Eric J, DO   40 mg at [id number]   folic acid (FOLVITE) tablet 1 mg  1 mg Oral Daily [place] Donnamarie Poag, DO   1 mg at 05/15/21 [id number]   hydrALAZINE (APRESOLINE) tablet 25 mg  25 mg Oral Q6H PRN [place] Donnamarie Poag, DO       levETIRAcetam (KEPPRA) tablet 500 mg  500 mg Oral BID [place] Eric J, DO   500 mg at 05/15/21 [id number]   LORazepam (ATIVAN) tablet 1-4 mg  1-4 mg Oral Q1H PRN [place]  Eric J, DO   1 mg at 05/15/21 1039   Or   LORazepam (ATIVAN) injection 1-4 mg  1-4 mg Intravenous Q1H PRN [place] Eric J, DO       multivitamin with minerals tablet 1 tablet  1 tablet Oral Daily [place] Eric J, DO   1 tablet at 05/15/21 [id number]   polyethylene glycol (MIRALAX / GLYCOLAX) packet 17 g  17 g Oral Daily PRN [place] Eric J, DO       prochlorperazine (COMPAZINE) injection 10 mg  10 mg Intravenous Q6H PRN [place] Eric J, DO   10 mg at 05/13/21 [id number]   thiamine tablet 100 mg  100 mg Oral Daily [place] Eric J, DO   100 mg at 05/15/21 [id number]   Or   thiamine (B-1) injection 100 mg  100 mg Intravenous Daily [place] Eric J, DO         Discharge Medications: Please see discharge summary for a list of discharge medications.  Relevant Imaging Results:  Relevant Lab Results:   Additional Information SSN [id number]  Arlie Solomons Imunique Samad, LCSW

## 2021-05-15 NOTE — Progress Notes (Signed)
PROGRESS NOTE    Ashley Bradley  OHY:[id number] DOB: August 26, 1950 DOA: 05/12/2021 PCP: Soundra Pilon, FNP    Brief Narrative:  70 year old female with a history of hypertension, depression, seizure disorder, alcohol abuse, admitted to the hospital with seizure.  It was felt that patient likely had alcohol withdrawal seizure.  She was continued on her home dose of Keppra, started on CIWA protocol, started on IV fluids.   Assessment & Plan:   Principal Problem:   Alcohol withdrawal seizure (HCC) Active Problems:   Major depression   Seizure disorder (HCC)   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HCC)   Essential hypertension   Transaminitis   Total bilirubin, elevated   Alcohol withdrawal seizure -Currently on CIWA protocol -She is chronically on Keppra, husband reports compliance -No evidence of further seizure since admission -Continue CIWA protocol with Ativan -Per staff, she still having significant tremors and occasional hallucinations requiring lorazepam  Alcohol abuse -Seen by [facility], patient is not interested in residential alcohol rehab program -She does plan to connect with AA after discharge  Transaminitis -Likely related to alcohol intake -Holding statin for now  Hypokalemia/hypophosphatemia -Related to poor p.o. intake -Replace  Hypertension -Chronically on losartan, hold for now due to borderline blood pressures  Depression -Continue on Prozac  Cerebral palsy -Chronic right lower extremity weakness  Right arm pain -She does have reported history of fall, although it is unclear when exactly she fell -Appears to have pain with decreased range of motion in her right elbow and shoulder -X-rays of elbow and shoulder are unremarkable -She also describes intermittent swelling, venous Dopplers pending  Generalized weakness -Seen by physical therapy with recommendations for skilled nursing facility placement -Her husband said that approximately a month ago she  was ambulating independently -Generalized weakness progressed over the past month since she was mostly laying in bed and drinking -Patient is agreeable for skilled nursing facility placement   DVT prophylaxis: enoxaparin (LOVENOX) injection 40 mg Start: 05/12/21 2200  Code Status: Full code Family Communication: Updated patient's husband over the phone Disposition Plan: Status is: Inpatient  Remains inpatient appropriate because: Continued management of alcohol withdrawal     Consultants:    Procedures:    Antimicrobials:      Subjective: Feels that her right arm is less painful and she can move it more today.  Staff says that she has been having hallucinations.  Feels that overall tremors are better.  Feels that Ativan makes her feel "weird".  Objective: Vitals:   05/15/21 0124 05/15/21 0620 05/15/21 0840 05/15/21 1054  BP: 138/82 (!) 141/86 (!) 141/92 139/89  Pulse: 93 99 100 100  Resp: (!) 24 (!) 24 (!) 24 (!) 24  Temp: 99.2 F (37.3 C) 100 F (37.8 C) 99.1 F (37.3 C) 99 F (37.2 C)  TempSrc: Oral Oral Oral Oral  SpO2: 95% 94% 98% 94%  Weight:      Height:        Intake/Output Summary (Last 24 hours) at 05/15/2021 1926 Last data filed at 05/15/2021 1800 Gross per 24 hour  Intake 480 ml  Output 4700 ml  Net -4220 ml   Filed Weights   05/12/21 0919 05/12/21 2036 05/14/21 0636  Weight: 65.3 kg 65.7 kg 68.7 kg    Examination:  General exam: Appears calm and comfortable  Respiratory system: Clear to auscultation. Respiratory effort normal. Cardiovascular system: S1 & S2 heard, RRR. No JVD, murmurs, rubs, gallops or clicks. No pedal edema. Gastrointestinal system: Abdomen is nondistended, soft  and nontender. No organomegaly or masses felt. Normal bowel sounds heard. Central nervous system: Alert and oriented. No focal neurological deficits. Extremities: Increased range of motion of right upper extremity, less painful Skin: No rashes, lesions or  ulcers Psychiatry: Judgement and insight appear normal. Mood & affect appropriate.     Data Reviewed: I have personally reviewed following labs and imaging studies  CBC: Recent Labs  Lab 05/12/21 1012 05/13/21 0440 05/14/21 0434 05/15/21 0529  WBC 7.4 5.9 5.9 4.9  NEUTROABS 6.5  --   --   --   HGB 13.7 12.1 11.9* 14.1  HCT 40.5 37.1 35.9* 40.6  MCV 98.1 101.6* 102.0* 97.8  PLT 166 134* 126* 154   Basic Metabolic Panel: Recent Labs  Lab 05/12/21 1012 05/13/21 0440 05/14/21 0434 05/14/21 1937 05/15/21 0529  NA 135 132* 134*  --  132*  K 3.7 2.9* 3.4*  --  3.2*  CL 97* 98 104  --  97*  CO2 26 27 24   --  23  GLUCOSE 167* 86 105*  --  82  BUN 12 12 13   --  6*  CREATININE 0.40* 0.39* 0.33*  --  <0.30*  CALCIUM 9.2 8.6* 8.0*  --  8.8*  MG 1.9 1.7 1.9  --  1.9  PHOS  --  2.4* 1.7* 5.2* 3.0   GFR: CrCl cannot be calculated (This lab value cannot be used to calculate CrCl because it is not a number: <0.30). Liver Function Tests: Recent Labs  Lab 05/12/21 1012 05/13/21 0440 05/14/21 0434 05/15/21 0529  AST 91* 60* 99* 64*  ALT 71* 50* 63* 61*  ALKPHOS 166* 137* 137* 157*  BILITOT 2.0* 1.2 0.9 1.6*  PROT 7.6 6.1* 5.5* 6.6  ALBUMIN 4.4 3.4* 2.9* 3.5   Recent Labs  Lab 05/12/21 1012  LIPASE 64*   No results for input(s): AMMONIA in the last 168 hours. Coagulation Profile: No results for input(s): INR, PROTIME in the last 168 hours. Cardiac Enzymes: No results for input(s): CKTOTAL, CKMB, CKMBINDEX, TROPONINI in the last 168 hours. BNP (last 3 results) No results for input(s): PROBNP in the last 8760 hours. HbA1C: No results for input(s): HGBA1C in the last 72 hours. CBG: No results for input(s): GLUCAP in the last 168 hours. Lipid Profile: No results for input(s): CHOL, HDL, LDLCALC, TRIG, CHOLHDL, LDLDIRECT in the last 72 hours. Thyroid Function Tests: No results for input(s): TSH, T4TOTAL, FREET4, T3FREE, THYROIDAB in the last 72 hours. Anemia  Panel: No results for input(s): VITAMINB12, FOLATE, FERRITIN, TIBC, IRON, RETICCTPCT in the last 72 hours. Sepsis Labs: No results for input(s): PROCALCITON, LATICACIDVEN in the last 168 hours.  Recent Results (from the past 240 hour(s))  Resp Panel by RT-PCR (Flu A&B, Covid) Nasopharyngeal Swab     Status: None   Collection Time: 05/12/21  2:26 PM   Specimen: Nasopharyngeal Swab; Nasopharyngeal(NP) swabs in vial transport medium  Result Value Ref Range Status   SARS Coronavirus 2 by RT PCR NEGATIVE NEGATIVE Final    Comment: (NOTE) SARS-CoV-2 target nucleic acids are NOT DETECTED.  The SARS-CoV-2 RNA is generally detectable in upper respiratory specimens during the acute phase of infection. The lowest concentration of SARS-CoV-2 viral copies this assay can detect is 138 copies/mL. A negative result does not preclude SARS-Cov-2 infection and should not be used as the sole basis for treatment or other patient management decisions. A negative result may occur with  improper specimen collection/handling, submission of specimen other than nasopharyngeal swab, presence  of viral mutation(s) within the areas targeted by this assay, and inadequate number of viral copies(<138 copies/mL). A negative result must be combined with clinical observations, patient history, and epidemiological information. The expected result is Negative.  Fact Sheet for Patients:  BloggerCourse.com  Fact Sheet for Healthcare Providers:  SeriousBroker.it  This test is no t yet approved or cleared by the [place] FDA and  has been authorized for detection and/or diagnosis of SARS-CoV-2 by FDA under an Emergency Use Authorization (EUA). This EUA will remain  in effect (meaning this test can be used) for the duration of the COVID-19 declaration under Section 564(b)(1) of the Act, 21 U.S.C.section 360bbb-3(b)(1), unless the authorization is terminated  or  revoked sooner.       Influenza A by PCR NEGATIVE NEGATIVE Final   Influenza B by PCR NEGATIVE NEGATIVE Final    Comment: (NOTE) The Xpert Xpress SARS-CoV-2/FLU/RSV plus assay is intended as an aid in the diagnosis of influenza from Nasopharyngeal swab specimens and should not be used as a sole basis for treatment. Nasal washings and aspirates are unacceptable for Xpert Xpress SARS-CoV-2/FLU/RSV testing.  Fact Sheet for Patients: BloggerCourse.com  Fact Sheet for Healthcare Providers: SeriousBroker.it  This test is not yet approved or cleared by the [place] FDA and has been authorized for detection and/or diagnosis of SARS-CoV-2 by FDA under an Emergency Use Authorization (EUA). This EUA will remain in effect (meaning this test can be used) for the duration of the COVID-19 declaration under Section 564(b)(1) of the Act, 21 U.S.C. section 360bbb-3(b)(1), unless the authorization is terminated or revoked.  Performed at [facility], [address], [place], [place] [address]          Radiology Studies: DG Shoulder Right  Result Date: 05/14/2021 CLINICAL DATA:  Fall EXAM: RIGHT ELBOW - 2 VIEW; RIGHT SHOULDER - 2+ VIEW COMPARISON:  None. FINDINGS: Right shoulder: There is no evidence of fracture, dislocation, or joint effusion. There is no evidence of arthropathy or other focal bone abnormality. Soft tissues are unremarkable. Right elbow: No evidence of fracture, dislocation, or definite joint effusion. No evidence of severe arthropathy. No aggressive appearing focal bone abnormality. Soft tissues are unremarkable. IMPRESSION: No acute displaced fracture or dislocation of the right shoulder and elbow. Electronically Signed   By: Tish Frederickson M.D.   On: 05/14/2021 19:08   DG Elbow 2 Views Right  Result Date: 05/14/2021 CLINICAL DATA:  Fall EXAM: RIGHT ELBOW - 2 VIEW; RIGHT SHOULDER - 2+ VIEW  COMPARISON:  None. FINDINGS: Right shoulder: There is no evidence of fracture, dislocation, or joint effusion. There is no evidence of arthropathy or other focal bone abnormality. Soft tissues are unremarkable. Right elbow: No evidence of fracture, dislocation, or definite joint effusion. No evidence of severe arthropathy. No aggressive appearing focal bone abnormality. Soft tissues are unremarkable. IMPRESSION: No acute displaced fracture or dislocation of the right shoulder and elbow. Electronically Signed   By: Tish Frederickson M.D.   On: 05/14/2021 19:08        Scheduled Meds:  enoxaparin (LOVENOX) injection  40 mg Subcutaneous Q24H   feeding supplement  237 mL Oral TID BM   FLUoxetine  40 mg Oral q morning   folic acid  1 mg Oral Daily   levETIRAcetam  500 mg Oral BID   multivitamin with minerals  1 tablet Oral Daily   thiamine  100 mg Oral Daily   Or   thiamine  100 mg Intravenous Daily  Continuous Infusions:     LOS: 3 days    Time spent:    Erick Blinks, MD [facility]   If 7PM-7AM, please contact night-coverage [email or web address]  05/15/2021, 7:26 PM

## 2021-05-16 ENCOUNTER — Inpatient Hospital Stay (HOSPITAL_COMMUNITY): Payer: Medicare Other

## 2021-05-16 DIAGNOSIS — R609 Edema, unspecified: Secondary | ICD-10-CM | POA: Diagnosis not present

## 2021-05-16 DIAGNOSIS — F329 Major depressive disorder, single episode, unspecified: Secondary | ICD-10-CM | POA: Diagnosis not present

## 2021-05-16 DIAGNOSIS — F10931 Alcohol use, unspecified with withdrawal delirium: Secondary | ICD-10-CM | POA: Diagnosis not present

## 2021-05-16 DIAGNOSIS — R569 Unspecified convulsions: Secondary | ICD-10-CM | POA: Diagnosis not present

## 2021-05-16 DIAGNOSIS — I10 Essential (primary) hypertension: Secondary | ICD-10-CM | POA: Diagnosis not present

## 2021-05-16 LAB — CBC
HCT: 40.5 % (ref 36.0–46.0)
Hemoglobin: 13.6 g/dL (ref 12.0–15.0)
MCH: 33.4 pg (ref 26.0–34.0)
MCHC: 33.6 g/dL (ref 30.0–36.0)
MCV: 99.5 fL (ref 80.0–100.0)
Platelets: 191 10*3/uL (ref 150–400)
RBC: 4.07 MIL/uL (ref 3.87–5.11)
RDW: 14.5 % (ref 11.5–15.5)
WBC: 4.9 10*3/uL (ref 4.0–10.5)
nRBC: 0 % (ref 0.0–0.2)

## 2021-05-16 LAB — BASIC METABOLIC PANEL
Anion gap: 7 (ref 5–15)
BUN: 15 mg/dL (ref 8–23)
CO2: 24 mmol/L (ref 22–32)
Calcium: 9.1 mg/dL (ref 8.9–10.3)
Chloride: 104 mmol/L (ref 98–111)
Creatinine, Ser: 0.32 mg/dL — ABNORMAL LOW (ref 0.44–1.00)
GFR, Estimated: >60 mL/min (ref >60)
Glucose, Bld: 103 mg/dL — ABNORMAL HIGH (ref 70–99)
Potassium: 4 mmol/L (ref 3.5–5.1)
Sodium: 135 mmol/L (ref 135–145)

## 2021-05-16 MED ORDER — CHLORDIAZEPOXIDE HCL 5 MG PO CAPS
25.0000 mg | ORAL_CAPSULE | ORAL | Status: AC
  Administered 2021-05-18 – 2021-05-19 (×2): 25 mg via ORAL
  Filled 2021-05-16 (×2): qty 5

## 2021-05-16 MED ORDER — LORAZEPAM 2 MG/ML IJ SOLN
1.0000 mg | Freq: Once | INTRAMUSCULAR | Status: AC
  Administered 2021-05-16: 1 mg via INTRAVENOUS
  Filled 2021-05-16: qty 1

## 2021-05-16 MED ORDER — CHLORDIAZEPOXIDE HCL 5 MG PO CAPS: 25.0000 mg | ORAL_CAPSULE | Freq: Four times a day (QID) | ORAL | Status: AC | PRN

## 2021-05-16 MED ORDER — CHLORDIAZEPOXIDE HCL 5 MG PO CAPS
25.0000 mg | ORAL_CAPSULE | Freq: Three times a day (TID) | ORAL | Status: AC
  Administered 2021-05-17 – 2021-05-18 (×3): 25 mg via ORAL
  Filled 2021-05-16 (×3): qty 5

## 2021-05-16 MED ORDER — LOPERAMIDE HCL 2 MG PO CAPS: 2.0000 mg | ORAL_CAPSULE | ORAL | Status: AC | PRN

## 2021-05-16 MED ORDER — CHLORDIAZEPOXIDE HCL 5 MG PO CAPS
25.0000 mg | ORAL_CAPSULE | Freq: Every day | ORAL | Status: AC
  Administered 2021-05-19: 25 mg via ORAL
  Filled 2021-05-16: qty 5

## 2021-05-16 MED ORDER — CHLORDIAZEPOXIDE HCL 5 MG PO CAPS
25.0000 mg | ORAL_CAPSULE | Freq: Four times a day (QID) | ORAL | Status: AC
  Administered 2021-05-16 – 2021-05-17 (×3): 25 mg via ORAL
  Filled 2021-05-16 (×3): qty 5

## 2021-05-16 MED ORDER — HYDROXYZINE HCL 25 MG PO TABS: 25.0000 mg | ORAL_TABLET | Freq: Four times a day (QID) | ORAL | Status: AC | PRN

## 2021-05-16 NOTE — Progress Notes (Signed)
Right upper extremity venous duplex completed. Refer to "CV Proc" under chart review to view preliminary results.  05/16/2021 10:10 AM Eula Fried., MHA, RVT, RDCS, RDMS

## 2021-05-16 NOTE — TOC Progression Note (Signed)
Transition of Care [facility]) - Progression Note    Patient Details  Name: Ashley Bradley MRN: [id number] Date of Birth: 1950-11-22  Transition of Care [facility]) CM/SW Contact  Lorri Frederick, LCSW Phone Number: 05/16/2021, 9:45 AM  Clinical Narrative:   CSW noted pt not sent out in hub yet, referral sent out in hub.  Level 2 PASSR still pending in [place].    Expected Discharge Plan: Home/Self Care Barriers to Discharge: Continued Medical Work up  Expected Discharge Plan and Services Expected Discharge Plan: Home/Self Care In-house Referral: Clinical Social Work     Living arrangements for the past 2 months: Single Family Home                                       Social Determinants of Health (SDOH) Interventions    Readmission Risk Interventions No flowsheet data found.

## 2021-05-16 NOTE — Progress Notes (Signed)
PROGRESS NOTE    DERRY SLAVEN  SHF:[id number] DOB: 1951-01-24 DOA: 05/12/2021 PCP: Soundra Pilon, FNP    Brief Narrative:  70 year old female with a history of hypertension, depression, seizure disorder, alcohol abuse, admitted to the hospital with seizure.  It was felt that patient likely had alcohol withdrawal seizure.  She was continued on her home dose of Keppra, started on CIWA protocol, started on IV fluids.   Assessment & Plan:   Principal Problem:   Alcohol withdrawal seizure (HCC) Active Problems:   Major depression   Seizure disorder (HCC)   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HCC)   Essential hypertension   Transaminitis   Total bilirubin, elevated   Alcohol withdrawal seizure -Currently on CIWA protocol -She is chronically on Keppra, husband reports compliance -No evidence of further seizure since admission -Currently on detox protocol with Librium  Alcohol abuse -Seen by [facility], patient is not interested in residential alcohol rehab program -She does plan to connect with AA after discharge  Transaminitis -Likely related to alcohol intake -Holding statin for now  Hypokalemia/hypophosphatemia -Related to poor p.o. intake -Replace  Hypertension -Chronically on losartan, hold for now since she is normotensive  Depression -Continue on Prozac  Cerebral palsy -Chronic right lower extremity weakness  Right arm pain -She does have reported history of fall, although it is unclear when exactly she fell -Appears to have pain with decreased range of motion in her right elbow and shoulder -X-rays of elbow and shoulder are unremarkable -She also describes intermittent swelling, venous Dopplers negative  Generalized weakness -Seen by physical therapy with recommendations for skilled nursing facility placement -Her husband said that approximately a month ago she was ambulating independently -Generalized weakness progressed over the past month since she was  mostly laying in bed and drinking -Patient is agreeable for skilled nursing facility placement   DVT prophylaxis: enoxaparin (LOVENOX) injection 40 mg Start: 05/12/21 2200  Code Status: Full code Family Communication: Updated patient's husband over the phone Disposition Plan: Status is: Inpatient  Remains inpatient appropriate because: Continued management of alcohol withdrawal     Consultants:    Procedures:    Antimicrobials:      Subjective: She feels well today.  Does not have pain in her right arm.  She is in good spirits.  Still tremulous this morning, but felt Ativan was helpful.  Objective: Vitals:   05/15/21 2022 05/16/21 0521 05/16/21 1138 05/16/21 1742  BP: 129/84 134/73 127/77 116/69  Pulse: 97 89 99 93  Resp: 20 20 20    Temp: 98.9 F (37.2 C) 98.1 F (36.7 C) 99 F (37.2 C)   TempSrc: Oral Oral Oral   SpO2: 95% 95% 97%   Weight:      Height:        Intake/Output Summary (Last 24 hours) at 05/16/2021 2035 Last data filed at 05/16/2021 1900 Gross per 24 hour  Intake 840 ml  Output 1150 ml  Net -310 ml   Filed Weights   05/12/21 0919 05/12/21 2036 05/14/21 0636  Weight: 65.3 kg 65.7 kg 68.7 kg    Examination:  General exam: Appears calm and comfortable  Respiratory system: Clear to auscultation. Respiratory effort normal. Cardiovascular system: S1 & S2 heard, RRR. No JVD, murmurs, rubs, gallops or clicks. No pedal edema. Gastrointestinal system: Abdomen is nondistended, soft and nontender. No organomegaly or masses felt. Normal bowel sounds heard. Central nervous system: Alert and oriented. No focal neurological deficits. Extremities: Increased range of motion of right upper extremity, less  painful Skin: No rashes, lesions or ulcers Psychiatry: Judgement and insight appear normal. Mood & affect appropriate.     Data Reviewed: I have personally reviewed following labs and imaging studies  CBC: Recent Labs  Lab 05/12/21 1012  05/13/21 0440 05/14/21 0434 05/15/21 0529 05/16/21 0524  WBC 7.4 5.9 5.9 4.9 4.9  NEUTROABS 6.5  --   --   --   --   HGB 13.7 12.1 11.9* 14.1 13.6  HCT 40.5 37.1 35.9* 40.6 40.5  MCV 98.1 101.6* 102.0* 97.8 99.5  PLT 166 134* 126* 154 191   Basic Metabolic Panel: Recent Labs  Lab 05/12/21 1012 05/13/21 0440 05/14/21 0434 05/14/21 1937 05/15/21 0529 05/16/21 0524  NA 135 132* 134*  --  132* 135  K 3.7 2.9* 3.4*  --  3.2* 4.0  CL 97* 98 104  --  97* 104  CO2 26 27 24   --  23 24  GLUCOSE 167* 86 105*  --  82 103*  BUN 12 12 13   --  6* 15  CREATININE 0.40* 0.39* 0.33*  --  <0.30* 0.32*  CALCIUM 9.2 8.6* 8.0*  --  8.8* 9.1  MG 1.9 1.7 1.9  --  1.9  --   PHOS  --  2.4* 1.7* 5.2* 3.0  --    GFR: Estimated Creatinine Clearance: 62.8 mL/min (A) (by C-G formula based on SCr of 0.32 mg/dL (L)). Liver Function Tests: Recent Labs  Lab 05/12/21 1012 05/13/21 0440 05/14/21 0434 05/15/21 0529  AST 91* 60* 99* 64*  ALT 71* 50* 63* 61*  ALKPHOS 166* 137* 137* 157*  BILITOT 2.0* 1.2 0.9 1.6*  PROT 7.6 6.1* 5.5* 6.6  ALBUMIN 4.4 3.4* 2.9* 3.5   Recent Labs  Lab 05/12/21 1012  LIPASE 64*   No results for input(s): AMMONIA in the last 168 hours. Coagulation Profile: No results for input(s): INR, PROTIME in the last 168 hours. Cardiac Enzymes: No results for input(s): CKTOTAL, CKMB, CKMBINDEX, TROPONINI in the last 168 hours. BNP (last 3 results) No results for input(s): PROBNP in the last 8760 hours. HbA1C: No results for input(s): HGBA1C in the last 72 hours. CBG: No results for input(s): GLUCAP in the last 168 hours. Lipid Profile: No results for input(s): CHOL, HDL, LDLCALC, TRIG, CHOLHDL, LDLDIRECT in the last 72 hours. Thyroid Function Tests: No results for input(s): TSH, T4TOTAL, FREET4, T3FREE, THYROIDAB in the last 72 hours. Anemia Panel: No results for input(s): VITAMINB12, FOLATE, FERRITIN, TIBC, IRON, RETICCTPCT in the last 72 hours. Sepsis Labs: No results  for input(s): PROCALCITON, LATICACIDVEN in the last 168 hours.  Recent Results (from the past 240 hour(s))  Resp Panel by RT-PCR (Flu A&B, Covid) Nasopharyngeal Swab     Status: None   Collection Time: 05/12/21  2:26 PM   Specimen: Nasopharyngeal Swab; Nasopharyngeal(NP) swabs in vial transport medium  Result Value Ref Range Status   SARS Coronavirus 2 by RT PCR NEGATIVE NEGATIVE Final    Comment: (NOTE) SARS-CoV-2 target nucleic acids are NOT DETECTED.  The SARS-CoV-2 RNA is generally detectable in upper respiratory specimens during the acute phase of infection. The lowest concentration of SARS-CoV-2 viral copies this assay can detect is 138 copies/mL. A negative result does not preclude SARS-Cov-2 infection and should not be used as the sole basis for treatment or other patient management decisions. A negative result may occur with  improper specimen collection/handling, submission of specimen other than nasopharyngeal swab, presence of viral mutation(s) within the areas targeted by this  assay, and inadequate number of viral copies(<138 copies/mL). A negative result must be combined with clinical observations, patient history, and epidemiological information. The expected result is Negative.  Fact Sheet for Patients:  BloggerCourse.com  Fact Sheet for Healthcare Providers:  SeriousBroker.it  This test is no t yet approved or cleared by the [place] FDA and  has been authorized for detection and/or diagnosis of SARS-CoV-2 by FDA under an Emergency Use Authorization (EUA). This EUA will remain  in effect (meaning this test can be used) for the duration of the COVID-19 declaration under Section 564(b)(1) of the Act, 21 U.S.C.section 360bbb-3(b)(1), unless the authorization is terminated  or revoked sooner.       Influenza A by PCR NEGATIVE NEGATIVE Final   Influenza B by PCR NEGATIVE NEGATIVE Final    Comment: (NOTE) The  Xpert Xpress SARS-CoV-2/FLU/RSV plus assay is intended as an aid in the diagnosis of influenza from Nasopharyngeal swab specimens and should not be used as a sole basis for treatment. Nasal washings and aspirates are unacceptable for Xpert Xpress SARS-CoV-2/FLU/RSV testing.  Fact Sheet for Patients: BloggerCourse.com  Fact Sheet for Healthcare Providers: SeriousBroker.it  This test is not yet approved or cleared by the [place] FDA and has been authorized for detection and/or diagnosis of SARS-CoV-2 by FDA under an Emergency Use Authorization (EUA). This EUA will remain in effect (meaning this test can be used) for the duration of the COVID-19 declaration under Section 564(b)(1) of the Act, 21 U.S.C. section 360bbb-3(b)(1), unless the authorization is terminated or revoked.  Performed at [facility], [address], [place], [place] [address]          Radiology Studies: VAS [place] UPPER EXTREMITY VENOUS DUPLEX  Result Date: 05/16/2021 UPPER VENOUS STUDY  Patient Name:  LAURENASHLEY VIAR  Date of Exam:   05/16/2021 Medical Rec #: [id number]        Accession #:    [id number] Date of Birth: [birth year]        Patient Gender: F Patient Age:   64 years Exam Location:  [facility] Procedure:      VAS [place] UPPER EXTREMITY VENOUS DUPLEX Referring Phys: Durward Mallard Josselin Gaulin --------------------------------------------------------------------------------  Indications: Edema Comparison Study: No prior study Performing Technologist: Gertie Fey MHA, RDMS, RVT, RDCS  Examination Guidelines: A complete evaluation includes B-mode imaging, spectral Doppler, color Doppler, and power Doppler as needed of all accessible portions of each vessel. Bilateral testing is considered an integral part of a complete examination. Limited examinations for reoccurring indications may be performed as noted.  Right Findings:  +----------+------------+---------+-----------+----------+-------+ RIGHT     CompressiblePhasicitySpontaneousPropertiesSummary +----------+------------+---------+-----------+----------+-------+ IJV           Full       Yes       Yes                      +----------+------------+---------+-----------+----------+-------+ Subclavian    Full       Yes       Yes                      +----------+------------+---------+-----------+----------+-------+ Axillary      Full       Yes       Yes                      +----------+------------+---------+-----------+----------+-------+ Brachial      Full       Yes  Yes                      +----------+------------+---------+-----------+----------+-------+ Radial        Full                                          +----------+------------+---------+-----------+----------+-------+ Ulnar         Full                                          +----------+------------+---------+-----------+----------+-------+ Cephalic      Full                                          +----------+------------+---------+-----------+----------+-------+ Basilic       Full                                          +----------+------------+---------+-----------+----------+-------+  Left Findings: +----------+------------+---------+-----------+----------+-------+ LEFT      CompressiblePhasicitySpontaneousPropertiesSummary +----------+------------+---------+-----------+----------+-------+ Subclavian               Yes       Yes                      +----------+------------+---------+-----------+----------+-------+  Summary:  Right: No evidence of deep vein thrombosis in the upper extremity. No evidence of superficial vein thrombosis in the upper extremity.  Left: No evidence of thrombosis in the subclavian.  *See table(s) above for measurements and observations.    Preliminary         Scheduled Meds:  chlordiazePOXIDE  25 mg  Oral QID   Followed by   Melene Muller ON 05/17/2021] chlordiazePOXIDE  25 mg Oral TID   Followed by   Melene Muller ON 05/18/2021] chlordiazePOXIDE  25 mg Oral BH-qamhs   Followed by   Melene Muller ON 05/19/2021] chlordiazePOXIDE  25 mg Oral Daily   enoxaparin (LOVENOX) injection  40 mg Subcutaneous Q24H   feeding supplement  237 mL Oral TID BM   FLUoxetine  40 mg Oral q morning   folic acid  1 mg Oral Daily   levETIRAcetam  500 mg Oral BID   multivitamin with minerals  1 tablet Oral Daily   thiamine  100 mg Oral Daily   Or   thiamine  100 mg Intravenous Daily   Continuous Infusions:     LOS: 4 days    Time spent:    Erick Blinks, MD [facility]   If 7PM-7AM, please contact night-coverage [email or web address]  05/16/2021, 8:35 PM

## 2021-05-17 DIAGNOSIS — I10 Essential (primary) hypertension: Secondary | ICD-10-CM | POA: Diagnosis not present

## 2021-05-17 DIAGNOSIS — F329 Major depressive disorder, single episode, unspecified: Secondary | ICD-10-CM | POA: Diagnosis not present

## 2021-05-17 DIAGNOSIS — R569 Unspecified convulsions: Secondary | ICD-10-CM | POA: Diagnosis not present

## 2021-05-17 DIAGNOSIS — F10931 Alcohol use, unspecified with withdrawal delirium: Secondary | ICD-10-CM | POA: Diagnosis not present

## 2021-05-17 LAB — BASIC METABOLIC PANEL
Anion gap: 9 (ref 5–15)
BUN: 9 mg/dL (ref 8–23)
CO2: 24 mmol/L (ref 22–32)
Calcium: 9.2 mg/dL (ref 8.9–10.3)
Chloride: 100 mmol/L (ref 98–111)
Creatinine, Ser: <0.3 mg/dL — ABNORMAL LOW (ref 0.44–1.00)
Glucose, Bld: 106 mg/dL — ABNORMAL HIGH (ref 70–99)
Potassium: 3.8 mmol/L (ref 3.5–5.1)
Sodium: 133 mmol/L — ABNORMAL LOW (ref 135–145)

## 2021-05-17 LAB — MAGNESIUM: Magnesium: 1.9 mg/dL (ref 1.7–2.4)

## 2021-05-17 LAB — RESP PANEL BY RT-PCR (FLU A&B, COVID) ARPGX2
Influenza A by PCR: NEGATIVE
Influenza B by PCR: NEGATIVE
SARS Coronavirus 2 by RT PCR: NEGATIVE

## 2021-05-17 MED ORDER — POTASSIUM CHLORIDE CRYS ER 20 MEQ PO TBCR
40.0000 meq | EXTENDED_RELEASE_TABLET | Freq: Once | ORAL | Status: AC
  Administered 2021-05-17: 40 meq via ORAL
  Filled 2021-05-17: qty 2

## 2021-05-17 NOTE — TOC Progression Note (Signed)
Transition of Care [facility]) - Progression Note    Patient Details  Name: Ashley Bradley MRN: [id number] Date of Birth: 18-Aug-1950  Transition of Care [facility]) CM/SW Contact  [place] Fassnacht, [name], RN Phone Number: 05/17/2021, 3:01 PM  Clinical Narrative: [facility] auth initiated Berkley Harvey [id number] auth for [facility]. MD notified to order covid.      Expected Discharge Plan: Skilled Nursing Facility Barriers to Discharge: Insurance Authorization  Expected Discharge Plan and Services Expected Discharge Plan: Skilled Nursing Facility In-house Referral: Clinical Social Work     Living arrangements for the past 2 months: Single Family Home                                       Social Determinants of Health (SDOH) Interventions    Readmission Risk Interventions No flowsheet data found.

## 2021-05-17 NOTE — Progress Notes (Signed)
PROGRESS NOTE    ITZABELLA SORRELS  VOJ:[id number] DOB: 01/27/1951 DOA: 05/12/2021 PCP: Soundra Pilon, FNP    Brief Narrative:  70 year old female with a history of hypertension, depression, seizure disorder, alcohol abuse, admitted to the hospital with seizure.  It was felt that patient likely had alcohol withdrawal seizure.  She was continued on her home dose of Keppra, started on CIWA protocol, started on IV fluids.   Assessment & Plan:   Principal Problem:   Alcohol withdrawal seizure (HCC) Active Problems:   Major depression   Seizure disorder (HCC)   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HCC)   Essential hypertension   Transaminitis   Total bilirubin, elevated   Alcohol withdrawal seizure -Currently on CIWA protocol -She is chronically on Keppra, husband reports compliance -No evidence of further seizure since admission -Currently on detox protocol with Librium  Alcohol abuse -Seen by [facility] patient is not interested in residential alcohol rehab program -She does plan to connect with AA after discharge  Transaminitis -Likely related to alcohol intake -Holding statin for now  Hypokalemia/hypophosphatemia -Related to poor p.o. intake -Replace  Hypertension -Chronically on losartan, hold for now since she is normotensive  Depression -Continue on Prozac  Cerebral palsy -Chronic right lower extremity weakness  Right arm pain -She does have reported history of fall, although it is unclear when exactly she fell -Appears to have pain with decreased range of motion in her right elbow and shoulder -X-rays of elbow and shoulder are unremarkable -She also describes intermittent swelling, venous Dopplers negative  Generalized weakness -Seen by physical therapy with recommendations for skilled nursing facility placement -Her husband said that approximately a month ago she was ambulating independently -Generalized weakness progressed over the past month since she was  mostly laying in bed and drinking -Patient is agreeable for skilled nursing facility placement -She is stable for discharge once a bed is available   DVT prophylaxis: enoxaparin (LOVENOX) injection 40 mg Start: 05/12/21 2200  Code Status: Full code Family Communication: Updated patient's husband over the phone Disposition Plan: Status is: Inpatient  Remains inpatient appropriate because: Continued management of alcohol withdrawal     Consultants:    Procedures:    Antimicrobials:      Subjective: She feels well today, no new complaints  Objective: Vitals:   05/17/21 0606 05/17/21 1348 05/17/21 1400 05/17/21 1431  BP: (!) 147/85   125/87  Pulse: 88 (!) 135 99 94  Resp:    18  Temp: 98.1 F (36.7 C)   98.7 F (37.1 C)  TempSrc: Oral   Oral  SpO2: 98%   97%  Weight:      Height:        Intake/Output Summary (Last 24 hours) at 05/17/2021 1904 Last data filed at 05/17/2021 0853 Gross per 24 hour  Intake 120 ml  Output 1000 ml  Net -880 ml   Filed Weights   05/12/21 0919 05/12/21 2036 05/14/21 0636  Weight: 65.3 kg 65.7 kg 68.7 kg    Examination:  General exam: Appears calm and comfortable  Respiratory system: Clear to auscultation. Respiratory effort normal. Cardiovascular system: S1 & S2 heard, RRR. No JVD, murmurs, rubs, gallops or clicks. No pedal edema. Gastrointestinal system: Abdomen is nondistended, soft and nontender. No organomegaly or masses felt. Normal bowel sounds heard. Central nervous system: Alert and oriented. No focal neurological deficits. Extremities: Increased range of motion of right upper extremity, less painful Skin: No rashes, lesions or ulcers Psychiatry: Judgement and insight appear normal.  Mood & affect appropriate.     Data Reviewed: I have personally reviewed following labs and imaging studies  CBC: Recent Labs  Lab 05/12/21 1012 05/13/21 0440 05/14/21 0434 05/15/21 0529 05/16/21 0524  WBC 7.4 5.9 5.9 4.9 4.9   NEUTROABS 6.5  --   --   --   --   HGB 13.7 12.1 11.9* 14.1 13.6  HCT 40.5 37.1 35.9* 40.6 40.5  MCV 98.1 101.6* 102.0* 97.8 99.5  PLT 166 134* 126* 154 191   Basic Metabolic Panel: Recent Labs  Lab 05/12/21 1012 05/13/21 0440 05/14/21 0434 05/14/21 1937 05/15/21 0529 05/16/21 0524 05/17/21 1230  NA 135 132* 134*  --  132* 135 133*  K 3.7 2.9* 3.4*  --  3.2* 4.0 3.8  CL 97* 98 104  --  97* 104 100  CO2 26 27 24   --  23 24 24   GLUCOSE 167* 86 105*  --  82 103* 106*  BUN 12 12 13   --  6* 15 9  CREATININE 0.40* 0.39* 0.33*  --  <0.30* 0.32* <0.30*  CALCIUM 9.2 8.6* 8.0*  --  8.8* 9.1 9.2  MG 1.9 1.7 1.9  --  1.9  --  1.9  PHOS  --  2.4* 1.7* 5.2* 3.0  --   --    GFR: CrCl cannot be calculated (This lab value cannot be used to calculate CrCl because it is not a number: <0.30). Liver Function Tests: Recent Labs  Lab 05/12/21 1012 05/13/21 0440 05/14/21 0434 05/15/21 0529  AST 91* 60* 99* 64*  ALT 71* 50* 63* 61*  ALKPHOS 166* 137* 137* 157*  BILITOT 2.0* 1.2 0.9 1.6*  PROT 7.6 6.1* 5.5* 6.6  ALBUMIN 4.4 3.4* 2.9* 3.5   Recent Labs  Lab 05/12/21 1012  LIPASE 64*   No results for input(s): AMMONIA in the last 168 hours. Coagulation Profile: No results for input(s): INR, PROTIME in the last 168 hours. Cardiac Enzymes: No results for input(s): CKTOTAL, CKMB, CKMBINDEX, TROPONINI in the last 168 hours. BNP (last 3 results) No results for input(s): PROBNP in the last 8760 hours. HbA1C: No results for input(s): HGBA1C in the last 72 hours. CBG: No results for input(s): GLUCAP in the last 168 hours. Lipid Profile: No results for input(s): CHOL, HDL, LDLCALC, TRIG, CHOLHDL, LDLDIRECT in the last 72 hours. Thyroid Function Tests: No results for input(s): TSH, T4TOTAL, FREET4, T3FREE, THYROIDAB in the last 72 hours. Anemia Panel: No results for input(s): VITAMINB12, FOLATE, FERRITIN, TIBC, IRON, RETICCTPCT in the last 72 hours. Sepsis Labs: No results for input(s):  PROCALCITON, LATICACIDVEN in the last 168 hours.  Recent Results (from the past 240 hour(s))  Resp Panel by RT-PCR (Flu A&B, Covid) Nasopharyngeal Swab     Status: None   Collection Time: 05/12/21  2:26 PM   Specimen: Nasopharyngeal Swab; Nasopharyngeal(NP) swabs in vial transport medium  Result Value Ref Range Status   SARS Coronavirus 2 by RT PCR NEGATIVE NEGATIVE Final    Comment: (NOTE) SARS-CoV-2 target nucleic acids are NOT DETECTED.  The SARS-CoV-2 RNA is generally detectable in upper respiratory specimens during the acute phase of infection. The lowest concentration of SARS-CoV-2 viral copies this assay can detect is 138 copies/mL. A negative result does not preclude SARS-Cov-2 infection and should not be used as the sole basis for treatment or other patient management decisions. A negative result may occur with  improper specimen collection/handling, submission of specimen other than nasopharyngeal swab, presence of viral mutation(s) within  the areas targeted by this assay, and inadequate number of viral copies(<138 copies/mL). A negative result must be combined with clinical observations, patient history, and epidemiological information. The expected result is Negative.  Fact Sheet for Patients:  BloggerCourse.com  Fact Sheet for Healthcare Providers:  SeriousBroker.it  This test is no t yet approved or cleared by the [place] FDA and  has been authorized for detection and/or diagnosis of SARS-CoV-2 by FDA under an Emergency Use Authorization (EUA). This EUA will remain  in effect (meaning this test can be used) for the duration of the COVID-19 declaration under Section 564(b)(1) of the Act, 21 U.S.C.section 360bbb-3(b)(1), unless the authorization is terminated  or revoked sooner.       Influenza A by PCR NEGATIVE NEGATIVE Final   Influenza B by PCR NEGATIVE NEGATIVE Final    Comment: (NOTE) The Xpert Xpress  SARS-CoV-2/FLU/RSV plus assay is intended as an aid in the diagnosis of influenza from Nasopharyngeal swab specimens and should not be used as a sole basis for treatment. Nasal washings and aspirates are unacceptable for Xpert Xpress SARS-CoV-2/FLU/RSV testing.  Fact Sheet for Patients: BloggerCourse.com  Fact Sheet for Healthcare Providers: SeriousBroker.it  This test is not yet approved or cleared by the [place] FDA and has been authorized for detection and/or diagnosis of SARS-CoV-2 by FDA under an Emergency Use Authorization (EUA). This EUA will remain in effect (meaning this test can be used) for the duration of the COVID-19 declaration under Section 564(b)(1) of the Act, 21 U.S.C. section 360bbb-3(b)(1), unless the authorization is terminated or revoked.  Performed at [facility], [address] [place], [place] [address]   Resp Panel by RT-PCR (Flu A&B, Covid) Nasopharyngeal Swab     Status: None   Collection Time: 05/17/21 12:23 PM   Specimen: Nasopharyngeal Swab; Nasopharyngeal(NP) swabs in vial transport medium  Result Value Ref Range Status   SARS Coronavirus 2 by RT PCR NEGATIVE NEGATIVE Final    Comment: (NOTE) SARS-CoV-2 target nucleic acids are NOT DETECTED.  The SARS-CoV-2 RNA is generally detectable in upper respiratory specimens during the acute phase of infection. The lowest concentration of SARS-CoV-2 viral copies this assay can detect is 138 copies/mL. A negative result does not preclude SARS-Cov-2 infection and should not be used as the sole basis for treatment or other patient management decisions. A negative result may occur with  improper specimen collection/handling, submission of specimen other than nasopharyngeal swab, presence of viral mutation(s) within the areas targeted by this assay, and inadequate number of viral copies(<138 copies/mL). A negative result must be  combined with clinical observations, patient history, and epidemiological information. The expected result is Negative.  Fact Sheet for Patients:  BloggerCourse.com  Fact Sheet for Healthcare Providers:  SeriousBroker.it  This test is no t yet approved or cleared by the [place] FDA and  has been authorized for detection and/or diagnosis of SARS-CoV-2 by FDA under an Emergency Use Authorization (EUA). This EUA will remain  in effect (meaning this test can be used) for the duration of the COVID-19 declaration under Section 564(b)(1) of the Act, 21 U.S.C.section 360bbb-3(b)(1), unless the authorization is terminated  or revoked sooner.       Influenza A by PCR NEGATIVE NEGATIVE Final   Influenza B by PCR NEGATIVE NEGATIVE Final    Comment: (NOTE) The Xpert Xpress SARS-CoV-2/FLU/RSV plus assay is intended as an aid in the diagnosis of influenza from Nasopharyngeal swab specimens and should not be used as a sole  basis for treatment. Nasal washings and aspirates are unacceptable for Xpert Xpress SARS-CoV-2/FLU/RSV testing.  Fact Sheet for Patients: BloggerCourse.com  Fact Sheet for Healthcare Providers: SeriousBroker.it  This test is not yet approved or cleared by the [place] FDA and has been authorized for detection and/or diagnosis of SARS-CoV-2 by FDA under an Emergency Use Authorization (EUA). This EUA will remain in effect (meaning this test can be used) for the duration of the COVID-19 declaration under Section 564(b)(1) of the Act, 21 U.S.C. section 360bbb-3(b)(1), unless the authorization is terminated or revoked.  Performed at [facility], [address], [place], [place] [address]          Radiology Studies: VAS [place] UPPER EXTREMITY VENOUS DUPLEX  Result Date: 05/17/2021 UPPER VENOUS STUDY  Patient Name:  DASHONA PLOURDE  Date of  Exam:   05/16/2021 Medical Rec #: [id number]        Accession #:    [id number] Date of Birth: [birth year]        Patient Gender: F Patient Age:   89 years Exam Location:  [facility] Procedure:      VAS [place] UPPER EXTREMITY VENOUS DUPLEX Referring Phys: Durward Mallard Odin Mariani --------------------------------------------------------------------------------  Indications: Edema Comparison Study: No prior study Performing Technologist: Gertie Fey MHA, RDMS, RVT, RDCS  Examination Guidelines: A complete evaluation includes B-mode imaging, spectral Doppler, color Doppler, and power Doppler as needed of all accessible portions of each vessel. Bilateral testing is considered an integral part of a complete examination. Limited examinations for reoccurring indications may be performed as noted.  Right Findings: +----------+------------+---------+-----------+----------+-------+ RIGHT     CompressiblePhasicitySpontaneousPropertiesSummary +----------+------------+---------+-----------+----------+-------+ IJV           Full       Yes       Yes                      +----------+------------+---------+-----------+----------+-------+ Subclavian    Full       Yes       Yes                      +----------+------------+---------+-----------+----------+-------+ Axillary      Full       Yes       Yes                      +----------+------------+---------+-----------+----------+-------+ Brachial      Full       Yes       Yes                      +----------+------------+---------+-----------+----------+-------+ Radial        Full                                          +----------+------------+---------+-----------+----------+-------+ Ulnar         Full                                          +----------+------------+---------+-----------+----------+-------+ Cephalic      Full                                           +----------+------------+---------+-----------+----------+-------+  Basilic       Full                                          +----------+------------+---------+-----------+----------+-------+  Left Findings: +----------+------------+---------+-----------+----------+-------+ LEFT      CompressiblePhasicitySpontaneousPropertiesSummary +----------+------------+---------+-----------+----------+-------+ Subclavian               Yes       Yes                      +----------+------------+---------+-----------+----------+-------+  Summary:  Right: No evidence of deep vein thrombosis in the upper extremity. No evidence of superficial vein thrombosis in the upper extremity.  Left: No evidence of thrombosis in the subclavian.  *See table(s) above for measurements and observations.  Diagnosing physician: Sherald Hess MD Electronically signed by Sherald Hess MD on 05/17/2021 at 11:57:45 AM.    Final         Scheduled Meds:  chlordiazePOXIDE  25 mg Oral TID   Followed by   Melene Muller ON 05/18/2021] chlordiazePOXIDE  25 mg Oral BH-qamhs   Followed by   Melene Muller ON 05/19/2021] chlordiazePOXIDE  25 mg Oral Daily   enoxaparin (LOVENOX) injection  40 mg Subcutaneous Q24H   feeding supplement  237 mL Oral TID BM   FLUoxetine  40 mg Oral q morning   folic acid  1 mg Oral Daily   levETIRAcetam  500 mg Oral BID   multivitamin with minerals  1 tablet Oral Daily   thiamine  100 mg Oral Daily   Or   thiamine  100 mg Intravenous Daily   Continuous Infusions:     LOS: 5 days    Time spent:    Erick Blinks, MD [facility]   If 7PM-7AM, please contact night-coverage [email or web address]  05/17/2021, 7:04 PM

## 2021-05-17 NOTE — Progress Notes (Signed)
Physical Therapy Treatment Patient Details Name: Ashley Bradley MRN: [id number] DOB: 1951-06-23 Today's Date: 05/17/2021   History of Present Illness 70 y.o. female admited 05/12/21 for EtOH withdrawal seizure. CT negative for acute intracranial abnormalities. PMH significant for essential HTN, depression/anxiety, seizure, hx of alcohol withdrawal, seizure, DTs.    PT Comments    Improved activity tolerance today, pt sat at edge of bed for 10 minutes with improved sitting balance. Attempted sit to stand x 2 with RW, pt unable to come to full stand with max assist. Performed lateral scoot towards head of bed x 2 with mod assist. Pt performed seated BUE/LE strengthening exercises at EOB. HR 135 with activity.     Recommendations for follow up therapy are one component of a multi-disciplinary discharge planning process, led by the attending physician.  Recommendations may be updated based on patient status, additional functional criteria and insurance authorization.  Follow Up Recommendations  Skilled nursing-short term rehab (<3 hours/day)     Assistance Recommended at Discharge Frequent or constant Supervision/Assistance  Equipment Recommendations  Hospital bed    Recommendations for Other Services       Precautions / Restrictions Precautions Precautions: Fall Precaution Comments: seizure Restrictions Weight Bearing Restrictions: No     Mobility  Bed Mobility Overal bed mobility: Needs Assistance   Rolling: Mod assist Sidelying to sit: Mod assist       General bed mobility comments: assist to initiate movement, mod A to power up    Transfers Overall transfer level: Needs assistance                Lateral/Scoot Transfers: Max assist General transfer comment: attempted sit to stand x 2 with RW from elevated bed, pt unable to come to full stand; then performed lateral scoot towards HOB x 2 with mod assist    Ambulation/Gait               General Gait  Details: unable   Stairs             Wheelchair Mobility    Modified Rankin (Stroke Patients Only)       Balance Overall balance assessment: Needs assistance Sitting-balance support: Feet supported;No upper extremity supported Sitting balance-Leahy Scale: Fair Sitting balance - Comments: able to sit unsupported for ~10 minutes     Standing balance-Leahy Scale: Zero                              Cognition Arousal/Alertness: Awake/alert Behavior During Therapy: WFL for tasks assessed/performed Overall Cognitive Status: Within Functional Limits for tasks assessed                                          Exercises General Exercises - Upper Extremity Shoulder Flexion: AROM;Left;10 reps;Seated General Exercises - Lower Extremity Long Arc Quad: AROM;Both;10 reps;Seated Hip Flexion/Marching: AROM;Both;10 reps;Seated    General Comments        Pertinent Vitals/Pain Pain Score: 3  Pain Location: RUE from shoulder to wrist Pain Descriptors / Indicators: Aching Pain Intervention(s): Limited activity within patient's tolerance;Monitored during session;Repositioned    Home Living                          Prior Function            PT Goals (  current goals can now be found in the care plan section) Acute Rehab PT Goals PT Goal Formulation: With patient/family Time For Goal Achievement: 05/27/21 Potential to Achieve Goals: Fair Progress towards PT goals: Progressing toward goals    Frequency    Min 2X/week      PT Plan Current plan remains appropriate    Co-evaluation              AM-PAC PT "6 Clicks" Mobility   Outcome Measure  Help needed turning from your back to your side while in a flat bed without using bedrails?: A Lot Help needed moving from lying on your back to sitting on the side of a flat bed without using bedrails?: A Lot Help needed moving to and from a bed to a chair (including a wheelchair)?:  Total Help needed standing up from a chair using your arms (e.g., wheelchair or bedside chair)?: Total Help needed to walk in hospital room?: Total Help needed climbing 3-5 steps with a railing? : Total 6 Click Score: 8    End of Session Equipment Utilized During Treatment: Gait belt Activity Tolerance: Patient limited by fatigue Patient left: in bed;with call bell/phone within reach;with family/visitor present Nurse Communication: Mobility status;Other (comment) (bed saturated in urine, purewick canister completely full) PT Visit Diagnosis: Other abnormalities of gait and mobility (R26.89);Muscle weakness (generalized) (M62.81)     Time: 4585-9292 PT Time Calculation (min) (ACUTE ONLY): 18 min  Charges:  $Therapeutic Activity: 8-22 mins                     Ralene Bathe Kistler PT 05/17/2021  Acute Rehabilitation Services Pager [phone] Office [phone]

## 2021-05-17 NOTE — TOC Progression Note (Addendum)
Transition of Care [facility]) - Progression Note    Patient Details  Name: Ashley Bradley MRN: [id number] Date of Birth: 06-10-1951  [facility]) CM/SW Contact  Dashiell Franchino, [name], RN Phone Number: 05/17/2021, 12:10 PM  Clinical Narrative: [facility] accepted-patient agree-will start auth, awaiting pasrr level 2. Will need covid test within 24-48hrs of d/c. MD notified.Per rounds anticipate d/c tomorrow. PT office notified for PT prior initiating auth.     Expected Discharge Plan: Skilled Nursing Facility Barriers to Discharge: Continued Medical Work up  Expected Discharge Plan and Services Expected Discharge Plan: Skilled Nursing Facility In-house Referral: Clinical Social Work     Living arrangements for the past 2 months: Single Family Home                                       Social Determinants of Health (SDOH) Interventions    Readmission Risk Interventions No flowsheet data found.

## 2021-05-18 MED ORDER — SACCHAROMYCES BOULARDII 250 MG PO CAPS
250.0000 mg | ORAL_CAPSULE | Freq: Every day | ORAL | Status: DC
  Administered 2021-05-18 – 2021-05-20 (×3): 250 mg via ORAL
  Filled 2021-05-18 (×4): qty 1

## 2021-05-18 NOTE — Progress Notes (Signed)
Occupational Therapy Treatment Patient Details Name: Ashley Bradley MRN: [id number] DOB: [birth year] Today's Date: 05/18/2021   History of present illness 70 y.o. female admited 05/12/21 for EtOH withdrawal seizure. CT negative for acute intracranial abnormalities. PMH significant for essential HTN, depression/anxiety, seizure, hx of alcohol withdrawal, seizure, DTs.   OT comments  Patient progressing and showed improved EOB sitting balance and tolerance during ADLs with ability to sit and use BUEs in functional grooming activity with setup, compared to previous session where pt tolerated bed level with increased assistance.  Pt did require encouragement to participate with therapy today stating that she planned to wait until going to SNF to work, but was very receptive to education on importance of daily mobility to tolerance, and agreed to engage with OT. Patient remains limited by anxiety and increased HR with activity to 130 EOB, pain to RUE, generalized weakness and decreased activity tolerance along with deficits noted below. Pt continues to demonstrate good rehab potential and would benefit from continued skilled OT to increase safety and independence with ADLs and functional transfers to allow pt to return home safely and reduce caregiver burden and fall risk.    Recommendations for follow up therapy are one component of a multi-disciplinary discharge planning process, led by the attending physician.  Recommendations may be updated based on patient status, additional functional criteria and insurance authorization.    Follow Up Recommendations  Skilled nursing-short term rehab (<3 hours/day)    Assistance Recommended at Discharge    Equipment Recommendations       Recommendations for Other Services      Precautions / Restrictions Precautions Precautions: Fall Precaution Comments: seizure Restrictions Weight Bearing Restrictions: No       Mobility Bed Mobility Overal bed  mobility: Needs Assistance Bed Mobility: Rolling;Sidelying to Sit;Sit to Supine;Sit to Sidelying Rolling: Min assist Sidelying to sit: Mod assist (for trunk)     Sit to sidelying: Mod assist (for LEs) General bed mobility comments: Anterior scooting to EOB, slow but with Min guard only.    Transfers                   General transfer comment: Pt declined working on transfers today due to fatigue after EOB ADLs.     Balance Overall balance assessment: Needs assistance Sitting-balance support: Feet supported;No upper extremity supported Sitting balance-Leahy Scale: Fair Sitting balance - Comments: able to sit unsupported for ~10 minutes                                   ADL either performed or assessed with clinical judgement   ADL Overall ADL's : Needs assistance/impaired Eating/Feeding: Set up;Bed level Eating/Feeding Details (indicate cue type and reason): Pt finishing lunch at bed level. Grooming: Set up;Sitting;Oral care Grooming Details (indicate cue type and reason): Pt sat EOB ~10 min and worked on restful sitting balance while discussing relaxation techniques in order to lower HR which was affective to bring HR down from 130 to 113. Pt then initiated oral hygiene with setup, regressing to Min As to offer back support for sitting due to increased HR to 125. With back support HR decreased to 119 as pt completed oral care.             Lower Body Dressing: Bed level;Moderate assistance;Cueing for sequencing Lower Body Dressing Details (indicate cue type and reason): Pt's Pure wick out of place and pt requested mesh  underwear to hold in place. Total Assist to don over feet, then pt able to bridge and roll RT and LT to pull over hips with Mod As.   Toilet Transfer Details (indicate cue type and reason): Pt shown Stedy and how it works for transfer assist. Pt was reluctant to use but stated if she felt well after EOB ADLs she would try. After ADLs pt  reported too much fatigue and asked to try another day. Toileting- Clothing Manipulation and Hygiene: Bed level;Maximal assistance              Extremity/Trunk Assessment     Lower Extremity Assessment RLE Deficits / Details: reports hx of CP with R LE deficits   Cervical / Trunk Assessment Cervical / Trunk Assessment: Normal    Vision Patient Visual Report: No change from baseline Vision Assessment?: No apparent visual deficits   Perception     Praxis      Cognition Arousal/Alertness: Awake/alert Behavior During Therapy: WFL for tasks assessed/performed Overall Cognitive Status: Within Functional Limits for tasks assessed                                 General Comments: Very pleasant and cooperative.          Exercises     Shoulder Instructions       General Comments      Pertinent Vitals/ Pain       Pain Assessment: Faces Faces Pain Scale: Hurts little more Pain Location: RUE from shoulder to wrist Pain Descriptors / Indicators: Sore Pain Intervention(s): Limited activity within patient's tolerance;Monitored during session;Repositioned  Home Living                                          Prior Functioning/Environment              Frequency  Min 2X/week        Progress Toward Goals  OT Goals(current goals can now be found in the care plan section)  Progress towards OT goals: Progressing toward goals  Acute Rehab OT Goals Patient Stated Goal: Pt tolerate activity within safe HR limits OT Goal Formulation: With patient Time For Goal Achievement: 05/27/21 Potential to Achieve Goals: Fair  Plan Discharge plan remains appropriate    Co-evaluation                 AM-PAC OT "6 Clicks" Daily Activity     Outcome Measure   Help from another person eating meals?: None Help from another person taking care of personal grooming?: A Little Help from another person toileting, which includes using toliet,  bedpan, or urinal?: A Lot Help from another person bathing (including washing, rinsing, drying)?: A Lot Help from another person to put on and taking off regular upper body clothing?: A Little Help from another person to put on and taking off regular lower body clothing?: A Lot 6 Click Score: 16    End of Session    OT Visit Diagnosis: Muscle weakness (generalized) (M62.81);Unsteadiness on feet (R26.81);Other abnormalities of gait and mobility (R26.89)   Activity Tolerance Patient tolerated treatment well;Patient limited by fatigue   Patient Left in bed;with call bell/phone within reach;with bed alarm set   Nurse Communication Mobility status        Time: 9833-8250 OT Time Calculation (min): 44  min  Charges: OT General Charges $OT Visit: 1 Visit OT Treatments $Self Care/Home Management : 8-22 mins $Therapeutic Activity: 23-37 mins  Ashley Bradley, OT Acute Rehab Services Office: [phone] 05/18/2021  Theodoro Clock 05/18/2021, 1:09 PM

## 2021-05-18 NOTE — TOC Progression Note (Signed)
Transition of Care [facility]) - Progression Note    Patient Details  Name: Ashley Bradley MRN: [id number] Date of Birth: 06-Jun-1951  Transition of Care [facility]) CM/SW Contact  Rosmery Duggin, [name], RN Phone Number: 05/18/2021, 10:10 AM  Clinical Narrative: Beaulah Dinning HW#[id number] Berkley Harvey YV#[id number] for [facility]-rep Aggie Cosier following/awaiting level 2 pasrr-delay placed. Will need covid.     Expected Discharge Plan: Skilled Nursing Facility Barriers to Discharge: Awaiting State Approval [facility])  Expected Discharge Plan and Services Expected Discharge Plan: Skilled Nursing Facility In-house Referral: Clinical Social Work     Living arrangements for the past 2 months: Single Family Home                                       Social Determinants of Health (SDOH) Interventions    Readmission Risk Interventions No flowsheet data found.

## 2021-05-18 NOTE — Progress Notes (Signed)
PROGRESS NOTE    Ashley Bradley  XMI:[id number] DOB: February 15, 1951 DOA: 05/12/2021 PCP: Soundra Pilon, FNP    Brief Narrative:  70 year old female with a history of hypertension, depression, seizure disorder, alcohol abuse, admitted to the hospital with seizure.  It was felt that patient likely had alcohol withdrawal seizure.  She was continued on her home dose of Keppra, started on CIWA protocol, started on IV fluids.  Overall condition has improved.  She is not had any seizure activity since admission.  Alcohol withdrawal is also now resolved.  She is on oral Librium protocol.  She is awaiting skilled nursing facility placement.  She is stable for discharge once a bed is available.   Assessment & Plan:   Principal Problem:   Alcohol withdrawal seizure (HCC) Active Problems:   Major depression   Seizure disorder (HCC)   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HCC)   Essential hypertension   Transaminitis   Total bilirubin, elevated   Alcohol withdrawal seizure -Currently on CIWA protocol -She is chronically on Keppra, husband reports compliance -No evidence of further seizure since admission -Currently on detox protocol with Librium  Alcohol abuse -Seen by [facility], patient is not interested in residential alcohol rehab program -She does plan to connect with AA after discharge  Transaminitis -Likely related to alcohol intake -Holding statin for now  Hypokalemia/hypophosphatemia -Related to poor p.o. intake -Replace  Hypertension -Chronically on losartan, hold for now since she is normotensive  Depression -Continue on Prozac  Cerebral palsy -Chronic right lower extremity weakness  Right arm pain -She does have reported history of fall, although it is unclear when exactly she fell -Appears to have pain with decreased range of motion in her right elbow and shoulder -X-rays of elbow and shoulder are unremarkable -She also describes intermittent swelling, venous Dopplers  negative  Generalized weakness -Seen by physical therapy with recommendations for skilled nursing facility placement -Her husband said that approximately a month ago she was ambulating independently -Generalized weakness progressed over the past month since she was mostly laying in bed and drinking -Patient is agreeable for skilled nursing facility placement -She is stable for discharge once a bed is available   DVT prophylaxis: enoxaparin (LOVENOX) injection 40 mg Start: 05/12/21 2200  Code Status: Full code Family Communication: Updated patient's husband over the phone Disposition Plan: Status is: Inpatient  Remains inpatient appropriate because: Continued management of alcohol withdrawal     Consultants:    Procedures:    Antimicrobials:      Subjective: She does not have any new complaints  Objective: Vitals:   05/17/21 1431 05/17/21 2044 05/18/21 0613 05/18/21 1411  BP: 125/87 126/72 128/65 113/68  Pulse: 94 97 87 92  Resp: 18 20 18 18   Temp: 98.7 F (37.1 C) 99 F (37.2 C) 98.4 F (36.9 C) 98.7 F (37.1 C)  TempSrc: Oral Oral Oral Oral  SpO2: 97% 95% 98% 96%  Weight:      Height:        Intake/Output Summary (Last 24 hours) at 05/18/2021 2116 Last data filed at 05/18/2021 1742 Gross per 24 hour  Intake 120 ml  Output 1600 ml  Net -1480 ml   Filed Weights   05/12/21 0919 05/12/21 2036 05/14/21 0636  Weight: 65.3 kg 65.7 kg 68.7 kg    Examination:  General exam: Appears calm and comfortable  Respiratory system: Clear to auscultation. Respiratory effort normal. Cardiovascular system: S1 & S2 heard, RRR. No JVD, murmurs, rubs, gallops or clicks. No  pedal edema. Gastrointestinal system: Abdomen is nondistended, soft and nontender. No organomegaly or masses felt. Normal bowel sounds heard. Central nervous system: Alert and oriented. No focal neurological deficits. Extremities: Increased range of motion of right upper extremity, less painful Skin:  No rashes, lesions or ulcers Psychiatry: Judgement and insight appear normal. Mood & affect appropriate.     Data Reviewed: I have personally reviewed following labs and imaging studies  CBC: Recent Labs  Lab 05/12/21 1012 05/13/21 0440 05/14/21 0434 05/15/21 0529 05/16/21 0524  WBC 7.4 5.9 5.9 4.9 4.9  NEUTROABS 6.5  --   --   --   --   HGB 13.7 12.1 11.9* 14.1 13.6  HCT 40.5 37.1 35.9* 40.6 40.5  MCV 98.1 101.6* 102.0* 97.8 99.5  PLT 166 134* 126* 154 191   Basic Metabolic Panel: Recent Labs  Lab 05/12/21 1012 05/13/21 0440 05/14/21 0434 05/14/21 1937 05/15/21 0529 05/16/21 0524 05/17/21 1230  NA 135 132* 134*  --  132* 135 133*  K 3.7 2.9* 3.4*  --  3.2* 4.0 3.8  CL 97* 98 104  --  97* 104 100  CO2 26 27 24   --  23 24 24   GLUCOSE 167* 86 105*  --  82 103* 106*  BUN 12 12 13   --  6* 15 9  CREATININE 0.40* 0.39* 0.33*  --  <0.30* 0.32* <0.30*  CALCIUM 9.2 8.6* 8.0*  --  8.8* 9.1 9.2  MG 1.9 1.7 1.9  --  1.9  --  1.9  PHOS  --  2.4* 1.7* 5.2* 3.0  --   --    GFR: CrCl cannot be calculated (This lab value cannot be used to calculate CrCl because it is not a number: <0.30). Liver Function Tests: Recent Labs  Lab 05/12/21 1012 05/13/21 0440 05/14/21 0434 05/15/21 0529  AST 91* 60* 99* 64*  ALT 71* 50* 63* 61*  ALKPHOS 166* 137* 137* 157*  BILITOT 2.0* 1.2 0.9 1.6*  PROT 7.6 6.1* 5.5* 6.6  ALBUMIN 4.4 3.4* 2.9* 3.5   Recent Labs  Lab 05/12/21 1012  LIPASE 64*   No results for input(s): AMMONIA in the last 168 hours. Coagulation Profile: No results for input(s): INR, PROTIME in the last 168 hours. Cardiac Enzymes: No results for input(s): CKTOTAL, CKMB, CKMBINDEX, TROPONINI in the last 168 hours. BNP (last 3 results) No results for input(s): PROBNP in the last 8760 hours. HbA1C: No results for input(s): HGBA1C in the last 72 hours. CBG: No results for input(s): GLUCAP in the last 168 hours. Lipid Profile: No results for input(s): CHOL, HDL,  LDLCALC, TRIG, CHOLHDL, LDLDIRECT in the last 72 hours. Thyroid Function Tests: No results for input(s): TSH, T4TOTAL, FREET4, T3FREE, THYROIDAB in the last 72 hours. Anemia Panel: No results for input(s): VITAMINB12, FOLATE, FERRITIN, TIBC, IRON, RETICCTPCT in the last 72 hours. Sepsis Labs: No results for input(s): PROCALCITON, LATICACIDVEN in the last 168 hours.  Recent Results (from the past 240 hour(s))  Resp Panel by RT-PCR (Flu A&B, Covid) Nasopharyngeal Swab     Status: None   Collection Time: 05/12/21  2:26 PM   Specimen: Nasopharyngeal Swab; Nasopharyngeal(NP) swabs in vial transport medium  Result Value Ref Range Status   SARS Coronavirus 2 by RT PCR NEGATIVE NEGATIVE Final    Comment: (NOTE) SARS-CoV-2 target nucleic acids are NOT DETECTED.  The SARS-CoV-2 RNA is generally detectable in upper respiratory specimens during the acute phase of infection. The lowest concentration of SARS-CoV-2 viral copies this  assay can detect is 138 copies/mL. A negative result does not preclude SARS-Cov-2 infection and should not be used as the sole basis for treatment or other patient management decisions. A negative result may occur with  improper specimen collection/handling, submission of specimen other than nasopharyngeal swab, presence of viral mutation(s) within the areas targeted by this assay, and inadequate number of viral copies(<138 copies/mL). A negative result must be combined with clinical observations, patient history, and epidemiological information. The expected result is Negative.  Fact Sheet for Patients:  BloggerCourse.com  Fact Sheet for Healthcare Providers:  SeriousBroker.it  This test is no t yet approved or cleared by the [place] FDA and  has been authorized for detection and/or diagnosis of SARS-CoV-2 by FDA under an Emergency Use Authorization (EUA). This EUA will remain  in effect (meaning this test  can be used) for the duration of the COVID-19 declaration under Section 564(b)(1) of the Act, 21 U.S.C.section 360bbb-3(b)(1), unless the authorization is terminated  or revoked sooner.       Influenza A by PCR NEGATIVE NEGATIVE Final   Influenza B by PCR NEGATIVE NEGATIVE Final    Comment: (NOTE) The Xpert Xpress SARS-CoV-2/FLU/RSV plus assay is intended as an aid in the diagnosis of influenza from Nasopharyngeal swab specimens and should not be used as a sole basis for treatment. Nasal washings and aspirates are unacceptable for Xpert Xpress SARS-CoV-2/FLU/RSV testing.  Fact Sheet for Patients: BloggerCourse.com  Fact Sheet for Healthcare Providers: SeriousBroker.it  This test is not yet approved or cleared by the [place] FDA and has been authorized for detection and/or diagnosis of SARS-CoV-2 by FDA under an Emergency Use Authorization (EUA). This EUA will remain in effect (meaning this test can be used) for the duration of the COVID-19 declaration under Section 564(b)(1) of the Act, 21 U.S.C. section 360bbb-3(b)(1), unless the authorization is terminated or revoked.  Performed at [facility], [address], [place], [place] [address]   Resp Panel by RT-PCR (Flu A&B, Covid) Nasopharyngeal Swab     Status: None   Collection Time: 05/17/21 12:23 PM   Specimen: Nasopharyngeal Swab; Nasopharyngeal(NP) swabs in vial transport medium  Result Value Ref Range Status   SARS Coronavirus 2 by RT PCR NEGATIVE NEGATIVE Final    Comment: (NOTE) SARS-CoV-2 target nucleic acids are NOT DETECTED.  The SARS-CoV-2 RNA is generally detectable in upper respiratory specimens during the acute phase of infection. The lowest concentration of SARS-CoV-2 viral copies this assay can detect is 138 copies/mL. A negative result does not preclude SARS-Cov-2 infection and should not be used as the sole basis for treatment  or other patient management decisions. A negative result may occur with  improper specimen collection/handling, submission of specimen other than nasopharyngeal swab, presence of viral mutation(s) within the areas targeted by this assay, and inadequate number of viral copies(<138 copies/mL). A negative result must be combined with clinical observations, patient history, and epidemiological information. The expected result is Negative.  Fact Sheet for Patients:  BloggerCourse.com  Fact Sheet for Healthcare Providers:  SeriousBroker.it  This test is no t yet approved or cleared by the [place] FDA and  has been authorized for detection and/or diagnosis of SARS-CoV-2 by FDA under an Emergency Use Authorization (EUA). This EUA will remain  in effect (meaning this test can be used) for the duration of the COVID-19 declaration under Section 564(b)(1) of the Act, 21 U.S.C.section 360bbb-3(b)(1), unless the authorization is terminated  or revoked sooner.  Influenza A by PCR NEGATIVE NEGATIVE Final   Influenza B by PCR NEGATIVE NEGATIVE Final    Comment: (NOTE) The Xpert Xpress SARS-CoV-2/FLU/RSV plus assay is intended as an aid in the diagnosis of influenza from Nasopharyngeal swab specimens and should not be used as a sole basis for treatment. Nasal washings and aspirates are unacceptable for Xpert Xpress SARS-CoV-2/FLU/RSV testing.  Fact Sheet for Patients: BloggerCourse.com  Fact Sheet for Healthcare Providers: SeriousBroker.it  This test is not yet approved or cleared by the [place] FDA and has been authorized for detection and/or diagnosis of SARS-CoV-2 by FDA under an Emergency Use Authorization (EUA). This EUA will remain in effect (meaning this test can be used) for the duration of the COVID-19 declaration under Section 564(b)(1) of the Act, 21 U.S.C. section  360bbb-3(b)(1), unless the authorization is terminated or revoked.  Performed at [facility], [address], [place], [place] [address]          Radiology Studies: No results found.      Scheduled Meds:  chlordiazePOXIDE  25 mg Oral BH-qamhs   Followed by   Melene Muller ON 05/19/2021] chlordiazePOXIDE  25 mg Oral Daily   enoxaparin (LOVENOX) injection  40 mg Subcutaneous Q24H   feeding supplement  237 mL Oral TID BM   FLUoxetine  40 mg Oral q morning   folic acid  1 mg Oral Daily   levETIRAcetam  500 mg Oral BID   multivitamin with minerals  1 tablet Oral Daily   saccharomyces boulardii  250 mg Oral Daily   thiamine  100 mg Oral Daily   Or   thiamine  100 mg Intravenous Daily   Continuous Infusions:     LOS: 6 days    Time spent:    Erick Blinks, MD [facility]   If 7PM-7AM, please contact night-coverage [email or web address]  05/18/2021, 9:16 PM

## 2021-05-19 LAB — CREATININE, SERUM
Creatinine, Ser: 0.37 mg/dL — ABNORMAL LOW (ref 0.44–1.00)
GFR, Estimated: >60 mL/min (ref >60)

## 2021-05-19 MED ORDER — FOLIC ACID 1 MG PO TABS: 1.0000 mg | ORAL_TABLET | Freq: Every day | ORAL | 0 refills | Status: DC

## 2021-05-19 MED ORDER — THIAMINE HCL 100 MG PO TABS: 100.0000 mg | ORAL_TABLET | Freq: Every day | ORAL | 0 refills | Status: DC

## 2021-05-19 MED ORDER — ENSURE ENLIVE PO LIQD: 237.0000 mL | Freq: Three times a day (TID) | ORAL | 12 refills | Status: DC

## 2021-05-19 NOTE — TOC Progression Note (Signed)
Transition of Care [facility]) - Progression Note    Patient Details  Name: Ashley Bradley MRN: [id number] Date of Birth: January 07, 1951  Transition of [facility]) CM/SW Contact  Evelean Bigler, [name], RN Phone Number: 05/19/2021, 9:34 AM  Clinical Narrative: Awaiting pasrr.TC [place] must to f/u w/administration of delay of pasrr-CM supv aware.  [employer] auth ends today for [facility].    Expected Discharge Plan: Skilled Nursing Facility Barriers to Discharge: Awaiting State Approval [facility])  Expected Discharge Plan and Services Expected Discharge Plan: Skilled Nursing Facility In-house Referral: Clinical Social Work     Living arrangements for the past 2 months: Single Family Home                                       Social Determinants of Health (SDOH) Interventions    Readmission Risk Interventions No flowsheet data found.

## 2021-05-19 NOTE — Discharge Summary (Signed)
Physician Discharge Summary  Ashley Bradley JXB:[id number] DOB: Jul 25, 1950 DOA: 05/12/2021  PCP: Soundra Pilon, FNP  Admit date: 05/12/2021 Discharge date: 05/19/2021  Admitted From: Home Disposition: SNF  Recommendations for Outpatient Follow-up:  Follow up with PCP in 1-2 weeks Please obtain CMP/CBC, Mag, Phos in one week Please follow up on the following pending results:  Home Health: No  Equipment/Devices: None  Discharge Condition: Stable  CODE STATUS: FULL CODE  Diet recommendation:   Brief/Interim Summary: The patient is a 70 year old overweight Caucasian female with a past medical history significant for but not limited to hypertension, depression, history of seizure disorder, history of alcohol abuse as well as other comorbidities who was admitted to hospital with a seizure.  It was felt likely the patient had alcohol withdrawal seizure as she had abruptly stopped drinking.  She was continued on her home dose of Keppra and started on CIWA protocol and started on IV fluids.  Overall her condition improved and she had not had any more seizure activity since admission.  Her alcohol withdrawal is improving and she has mild tremors and she has been placed on oral Librium taper which is to be completed today.  She is deemed medically stable to be discharged and she is awaiting skilled nursing facility placement and is stable for discharge once bed is available and once insurance authorization is obtained as well as her PASRR number.   Discharge Diagnoses:  Principal Problem:   Alcohol withdrawal seizure (HCC) Active Problems:   Major depression   Seizure disorder (HCC)   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HCC)   Essential hypertension   Transaminitis   Total bilirubin, elevated  Alcohol withdrawal seizure -Currently on CIWA protocol -She is chronically on levetiracetam 5 mg p.o. twice daily, husband reports compliance -No evidence of further seizure since  admission -Currently on detox protocol with Librium she is to receive her last dose of Librium today -Continue with folic acid 1 mg p.o. daily, multivitamin with minerals 1 tab p.o. daily as well as thiamine 100 mg p.o./IV daily   Alcohol abuse -Seen by TOC, patient is not interested in residential alcohol rehab program -Daily with folic acid 1 mg p.o. daily, multivitamin with minerals daily 1 tab p.o. daily, and thiamine 100 mg p.o/IV. daily -She does plan to connect with AA after discharge   Transaminitis/abnormal LFTs Hyperbilirubinemia -Likely related to alcohol intake -Holding statin for now -AST and ALT had been slightly elevated but mildly improved. -AST went from 99 and trended down to 60 on last check -ALT went from 63 and trended on 61 on last check -T bili trended up slightly and went from 0.9-1.6 on last check -Continue to monitor and trend and repeat CMP within 1 week   Hypokalemia/hypophosphatemia -Related to poor p.o. intake -Given replete meant prior to discharge and potassium level is 3.8 on last check and her phosphorus level was 3.0 on last check -Continue monitor and trend and repeat CMP in the a.m.  Hyponatremia -Mild and essentially stable -Patient's sodium went from 132 -> 134 -> 132 -> 135 -> 133 -Trend and repeat CMP in the outpatient setting within 1 week   Hypertension -Chronically on losartan but was on hold for now since she was normotensive -Continue to monitor blood pressures per protocol -last blood pressure reading was 118/61   Depression and anxiety -Continue on fluoxetine 40 g p.o. daily   Cerebral palsy -Chronic right lower extremity weakness   Right arm pain -She does have reported  history of fall, although it is unclear when exactly she fell -Appears to have pain with decreased range of motion in her right elbow and shoulder -X-rays of elbow and shoulder are unremarkable -She also describes intermittent swelling, venous Dopplers  negative -Follow-up with orthopedic surgery in the outpatient setting   Generalized weakness -Seen by physical therapy with recommendations for skilled nursing facility placement -Her husband said that approximately a month ago she was ambulating independently -Generalized weakness progressed over the past month since she was mostly laying in bed and drinking -Patient is agreeable for skilled nursing facility placement -She is stable for discharge once a bed is available; bed is available and she is informed authorization but currently awaiting PASRR  Discharge Instructions  Discharge Instructions     Call MD for:  difficulty breathing, headache or visual disturbances   Complete by: As directed    Call MD for:  extreme fatigue   Complete by: As directed    Call MD for:  hives   Complete by: As directed    Call MD for:  persistant dizziness or light-headedness   Complete by: As directed    Call MD for:  persistant nausea and vomiting   Complete by: As directed    Call MD for:  redness, tenderness, or signs of infection (pain, swelling, redness, odor or green/yellow discharge around incision site)   Complete by: As directed    Call MD for:  severe uncontrolled pain   Complete by: As directed    Call MD for:  temperature >100.4   Complete by: As directed    Diet - low sodium heart healthy   Complete by: As directed    Discharge instructions   Complete by: As directed    You were cared for by a hospitalist during your hospital stay. If you have any questions about your discharge medications or the care you received while you were in the hospital after you are discharged, you can call the unit and ask to speak with the hospitalist on call if the hospitalist that took care of you is not available. Once you are discharged, your primary care physician will handle any further medical issues. Please note that NO REFILLS for any discharge medications will be authorized once you are discharged,  as it is imperative that you return to your primary care physician (or establish a relationship with a primary care physician if you do not have one) for your aftercare needs so that they can reassess your need for medications and monitor your lab values.  Follow up with PCP and follow up with Resources provided to you by TOC. Take all medications as prescribed. If symptoms change or worsen please return to the ED for evaluation   Increase activity slowly   Complete by: As directed       Allergies as of 05/19/2021       Reactions   Ancef [cefazolin] Other (See Comments)   Skin blisters, peeling.   Pantopaque [iophendylate] Other (See Comments)   Aseptic meningitis.   Macrodantin [nitrofurantoin] Nausea And Vomiting   Morphine And Related Nausea And Vomiting   Per patient, only has reaction when given IV. Okay to take PO.        Medication List     TAKE these medications    aspirin EC 81 MG tablet Take 81 mg by mouth daily.   atorvastatin 10 MG tablet Commonly known as: LIPITOR Take 10 mg by mouth daily.   BIOTIN PO Take  5,000 mcg by mouth daily.   CALCIUM PO Take 500 mg by mouth daily.   feeding supplement Liqd Take 237 mLs by mouth 3 (three) times daily between meals.   FLUoxetine 40 MG capsule Commonly known as: PROZAC Take 40 mg by mouth every morning.   folic acid 1 MG tablet Commonly known as: FOLVITE Take 1 tablet (1 mg total) by mouth daily. Start taking on: May 20, 2021   levETIRAcetam 500 MG tablet Commonly known as: KEPPRA Take 1 tablet (500 mg total) by mouth 2 (two) times daily.   losartan 100 MG tablet Commonly known as: COZAAR Take 50 mg by mouth daily.   multivitamin with minerals Tabs tablet Take 1 tablet by mouth daily.   saccharomyces boulardii 250 MG capsule Commonly known as: FLORASTOR Take 250 mg by mouth 2 (two) times daily.   thiamine 100 MG tablet Take 1 tablet (100 mg total) by mouth daily. Start taking on: May 20, 2021   VITAMIN D PO Take 2,000 Units by mouth daily.        Contact information for after-discharge care     Destination     [facility] Preferred SNF .   Service: Skilled Nursing Contact information: [address] [place] [address] [phone]                    Allergies  Allergen Reactions   Ancef [Cefazolin] Other (See Comments)    Skin blisters, peeling.   Pantopaque [Iophendylate] Other (See Comments)    Aseptic meningitis.   Macrodantin [Nitrofurantoin] Nausea And Vomiting   Morphine And Related Nausea And Vomiting    Per patient, only has reaction when given IV. Okay to take PO.   Consultations: None  Procedures/Studies: DG Shoulder Right  Result Date: 05/14/2021 CLINICAL DATA:  Fall EXAM: RIGHT ELBOW - 2 VIEW; RIGHT SHOULDER - 2+ VIEW COMPARISON:  None. FINDINGS: Right shoulder: There is no evidence of fracture, dislocation, or joint effusion. There is no evidence of arthropathy or other focal bone abnormality. Soft tissues are unremarkable. Right elbow: No evidence of fracture, dislocation, or definite joint effusion. No evidence of severe arthropathy. No aggressive appearing focal bone abnormality. Soft tissues are unremarkable. IMPRESSION: No acute displaced fracture or dislocation of the right shoulder and elbow. Electronically Signed   By: Tish Frederickson M.D.   On: 05/14/2021 19:08   DG Elbow 2 Views Right  Result Date: 05/14/2021 CLINICAL DATA:  Fall EXAM: RIGHT ELBOW - 2 VIEW; RIGHT SHOULDER - 2+ VIEW COMPARISON:  None. FINDINGS: Right shoulder: There is no evidence of fracture, dislocation, or joint effusion. There is no evidence of arthropathy or other focal bone abnormality. Soft tissues are unremarkable. Right elbow: No evidence of fracture, dislocation, or definite joint effusion. No evidence of severe arthropathy. No aggressive appearing focal bone abnormality. Soft tissues are unremarkable.  IMPRESSION: No acute displaced fracture or dislocation of the right shoulder and elbow. Electronically Signed   By: Tish Frederickson M.D.   On: 05/14/2021 19:08   CT Head Wo Contrast  Result Date: 05/12/2021 CLINICAL DATA:  Seizure. EXAM: CT HEAD WITHOUT CONTRAST TECHNIQUE: Contiguous axial images were obtained from the base of the skull through the vertex without intravenous contrast. COMPARISON:  CT head dated January 24, 2020. FINDINGS: Brain: No evidence of acute infarction, hemorrhage, hydrocephalus, extra-axial collection or mass lesion/mass effect. Stable mild atrophy. Vascular: No hyperdense vessel or unexpected calcification. Skull: Normal. Negative for fracture or focal lesion.  Sinuses/Orbits: No acute finding. Other: None. IMPRESSION: 1.  No acute intracranial abnormality. Electronically Signed   By: Obie Dredge M.D.   On: 05/12/2021 11:07   VAS [place] UPPER EXTREMITY VENOUS DUPLEX  Result Date: 05/17/2021 UPPER VENOUS STUDY  Patient Name:  Ashley Bradley  Date of Exam:   05/16/2021 Medical Rec #: [id number]        Accession #:    [id number] Date of Birth: 22-Sep-1950        Patient Gender: F Patient Age:   42 years Exam Location:  [facility] Procedure:      VAS [place] UPPER EXTREMITY VENOUS DUPLEX Referring Phys: Durward Mallard MEMON --------------------------------------------------------------------------------  Indications: Edema Comparison Study: No prior study Performing Technologist: Gertie Fey MHA, RDMS, RVT, RDCS  Examination Guidelines: A complete evaluation includes B-mode imaging, spectral Doppler, color Doppler, and power Doppler as needed of all accessible portions of each vessel. Bilateral testing is considered an integral part of a complete examination. Limited examinations for reoccurring indications may be performed as noted.  Right Findings: +----------+------------+---------+-----------+----------+-------+ RIGHT     CompressiblePhasicitySpontaneousPropertiesSummary  +----------+------------+---------+-----------+----------+-------+ IJV           Full       Yes       Yes                      +----------+------------+---------+-----------+----------+-------+ Subclavian    Full       Yes       Yes                      +----------+------------+---------+-----------+----------+-------+ Axillary      Full       Yes       Yes                      +----------+------------+---------+-----------+----------+-------+ Brachial      Full       Yes       Yes                      +----------+------------+---------+-----------+----------+-------+ Radial        Full                                          +----------+------------+---------+-----------+----------+-------+ Ulnar         Full                                          +----------+------------+---------+-----------+----------+-------+ Cephalic      Full                                          +----------+------------+---------+-----------+----------+-------+ Basilic       Full                                          +----------+------------+---------+-----------+----------+-------+  Left Findings: +----------+------------+---------+-----------+----------+-------+ LEFT      CompressiblePhasicitySpontaneousPropertiesSummary +----------+------------+---------+-----------+----------+-------+ Subclavian               Yes  Yes                      +----------+------------+---------+-----------+----------+-------+  Summary:  Right: No evidence of deep vein thrombosis in the upper extremity. No evidence of superficial vein thrombosis in the upper extremity.  Left: No evidence of thrombosis in the subclavian.  *See table(s) above for measurements and observations.  Diagnosing physician: Sherald Hess MD Electronically signed by Sherald Hess MD on 05/17/2021 at 11:57:45 AM.    Final      Subjective: Seen and examined at bedside and she was medically  stable to be discharged to SNF.  States that she had some mild arm pain.  No chest pain or shortness of breath. Stable to D/C to SNF  Discharge Exam: Vitals:   05/18/21 2119 05/19/21 0402  BP: 108/79 118/61  Pulse: 93 86  Resp: 20 20  Temp: 97.6 F (36.4 C) 98.2 F (36.8 C)  SpO2: 93% 94%   Vitals:   05/18/21 0613 05/18/21 1411 05/18/21 2119 05/19/21 0402  BP: 128/65 113/68 108/79 118/61  Pulse: 87 92 93 86  Resp: 18 18 20 20   Temp: 98.4 F (36.9 C) 98.7 F (37.1 C) 97.6 F (36.4 C) 98.2 F (36.8 C)  TempSrc: Oral Oral Oral Oral  SpO2: 98% 96% 93% 94%  Weight:      Height:       General: Pt is alert, awake, not in acute distress Cardiovascular: RRR, S1/S2 +, no rubs, no gallops Respiratory: Diminished bilaterally, no wheezing, no rhonchi; Unlabored breathing  Abdominal: Soft, NT, slightly distended, bowel sounds + Extremities: no edema, no cyanosis  The results of significant diagnostics from this hospitalization (including imaging, microbiology, ancillary and laboratory) are listed below for reference.    Microbiology: Recent Results (from the past 240 hour(s))  Resp Panel by RT-PCR (Flu A&B, Covid) Nasopharyngeal Swab     Status: None   Collection Time: 05/12/21  2:26 PM   Specimen: Nasopharyngeal Swab; Nasopharyngeal(NP) swabs in vial transport medium  Result Value Ref Range Status   SARS Coronavirus 2 by RT PCR NEGATIVE NEGATIVE Final    Comment: (NOTE) SARS-CoV-2 target nucleic acids are NOT DETECTED.  The SARS-CoV-2 RNA is generally detectable in upper respiratory specimens during the acute phase of infection. The lowest concentration of SARS-CoV-2 viral copies this assay can detect is 138 copies/mL. A negative result does not preclude SARS-Cov-2 infection and should not be used as the sole basis for treatment or other patient management decisions. A negative result may occur with  improper specimen collection/handling, submission of specimen other than  nasopharyngeal swab, presence of viral mutation(s) within the areas targeted by this assay, and inadequate number of viral copies(<138 copies/mL). A negative result must be combined with clinical observations, patient history, and epidemiological information. The expected result is Negative.  Fact Sheet for Patients:  BloggerCourse.com  Fact Sheet for Healthcare Providers:  SeriousBroker.it  This test is no t yet approved or cleared by the [place] FDA and  has been authorized for detection and/or diagnosis of SARS-CoV-2 by FDA under an Emergency Use Authorization (EUA). This EUA will remain  in effect (meaning this test can be used) for the duration of the COVID-19 declaration under Section 564(b)(1) of the Act, 21 U.S.C.section 360bbb-3(b)(1), unless the authorization is terminated  or revoked sooner.       Influenza A by PCR NEGATIVE NEGATIVE Final   Influenza B by PCR NEGATIVE NEGATIVE Final    Comment: (NOTE) The  Xpert Xpress SARS-CoV-2/FLU/RSV plus assay is intended as an aid in the diagnosis of influenza from Nasopharyngeal swab specimens and should not be used as a sole basis for treatment. Nasal washings and aspirates are unacceptable for Xpert Xpress SARS-CoV-2/FLU/RSV testing.  Fact Sheet for Patients: BloggerCourse.com  Fact Sheet for Healthcare Providers: SeriousBroker.it  This test is not yet approved or cleared by the [place] FDA and has been authorized for detection and/or diagnosis of SARS-CoV-2 by FDA under an Emergency Use Authorization (EUA). This EUA will remain in effect (meaning this test can be used) for the duration of the COVID-19 declaration under Section 564(b)(1) of the Act, 21 U.S.C. section 360bbb-3(b)(1), unless the authorization is terminated or revoked.  Performed at [facility], [address], [place], [place] [address]   Resp Panel by RT-PCR (Flu A&B, Covid) Nasopharyngeal Swab     Status: None   Collection Time: 05/17/21 12:23 PM   Specimen: Nasopharyngeal Swab; Nasopharyngeal(NP) swabs in vial transport medium  Result Value Ref Range Status   SARS Coronavirus 2 by RT PCR NEGATIVE NEGATIVE Final    Comment: (NOTE) SARS-CoV-2 target nucleic acids are NOT DETECTED.  The SARS-CoV-2 RNA is generally detectable in upper respiratory specimens during the acute phase of infection. The lowest concentration of SARS-CoV-2 viral copies this assay can detect is 138 copies/mL. A negative result does not preclude SARS-Cov-2 infection and should not be used as the sole basis for treatment or other patient management decisions. A negative result may occur with  improper specimen collection/handling, submission of specimen other than nasopharyngeal swab, presence of viral mutation(s) within the areas targeted by this assay, and inadequate number of viral copies(<138 copies/mL). A negative result must be combined with clinical observations, patient history, and epidemiological information. The expected result is Negative.  Fact Sheet for Patients:  BloggerCourse.com  Fact Sheet for Healthcare Providers:  SeriousBroker.it  This test is no t yet approved or cleared by the [place] FDA and  has been authorized for detection and/or diagnosis of SARS-CoV-2 by FDA under an Emergency Use Authorization (EUA). This EUA will remain  in effect (meaning this test can be used) for the duration of the COVID-19 declaration under Section 564(b)(1) of the Act, 21 U.S.C.section 360bbb-3(b)(1), unless the authorization is terminated  or revoked sooner.       Influenza A by PCR NEGATIVE NEGATIVE Final   Influenza B by PCR NEGATIVE NEGATIVE Final    Comment: (NOTE) The Xpert Xpress SARS-CoV-2/FLU/RSV plus assay is intended as an aid in the  diagnosis of influenza from Nasopharyngeal swab specimens and should not be used as a sole basis for treatment. Nasal washings and aspirates are unacceptable for Xpert Xpress SARS-CoV-2/FLU/RSV testing.  Fact Sheet for Patients: BloggerCourse.com  Fact Sheet for Healthcare Providers: SeriousBroker.it  This test is not yet approved or cleared by the [place] FDA and has been authorized for detection and/or diagnosis of SARS-CoV-2 by FDA under an Emergency Use Authorization (EUA). This EUA will remain in effect (meaning this test can be used) for the duration of the COVID-19 declaration under Section 564(b)(1) of the Act, 21 U.S.C. section 360bbb-3(b)(1), unless the authorization is terminated or revoked.  Performed at [facility], [address], [place], [place] [address]     Labs: BNP (last 3 results) No results for input(s): BNP in the last 8760 hours. Basic Metabolic Panel: Recent Labs  Lab 05/13/21 0440 05/14/21 0434 05/14/21 1937 05/15/21 0529 05/16/21 0524 05/17/21 1230  05/19/21 0437  NA 132* 134*  --  132* 135 133*  --   K 2.9* 3.4*  --  3.2* 4.0 3.8  --   CL 98 104  --  97* 104 100  --   CO2 27 24  --  23 24 24   --   GLUCOSE 86 105*  --  82 103* 106*  --   BUN 12 13  --  6* 15 9  --   CREATININE 0.39* 0.33*  --  <0.30* 0.32* <0.30* 0.37*  CALCIUM 8.6* 8.0*  --  8.8* 9.1 9.2  --   MG 1.7 1.9  --  1.9  --  1.9  --   PHOS 2.4* 1.7* 5.2* 3.0  --   --   --    Liver Function Tests: Recent Labs  Lab 05/13/21 0440 05/14/21 0434 05/15/21 0529  AST 60* 99* 64*  ALT 50* 63* 61*  ALKPHOS 137* 137* 157*  BILITOT 1.2 0.9 1.6*  PROT 6.1* 5.5* 6.6  ALBUMIN 3.4* 2.9* 3.5   No results for input(s): LIPASE, AMYLASE in the last 168 hours. No results for input(s): AMMONIA in the last 168 hours. CBC: Recent Labs  Lab 05/13/21 0440 05/14/21 0434 05/15/21 0529 05/16/21 0524  WBC 5.9 5.9 4.9  4.9  HGB 12.1 11.9* 14.1 13.6  HCT 37.1 35.9* 40.6 40.5  MCV 101.6* 102.0* 97.8 99.5  PLT 134* 126* 154 191   Cardiac Enzymes: No results for input(s): CKTOTAL, CKMB, CKMBINDEX, TROPONINI in the last 168 hours. BNP: Invalid input(s): POCBNP CBG: No results for input(s): GLUCAP in the last 168 hours. D-Dimer No results for input(s): DDIMER in the last 72 hours. Hgb A1c No results for input(s): HGBA1C in the last 72 hours. Lipid Profile No results for input(s): CHOL, HDL, LDLCALC, TRIG, CHOLHDL, LDLDIRECT in the last 72 hours. Thyroid function studies No results for input(s): TSH, T4TOTAL, T3FREE, THYROIDAB in the last 72 hours.  Invalid input(s): FREET3 Anemia work up No results for input(s): VITAMINB12, FOLATE, FERRITIN, TIBC, IRON, RETICCTPCT in the last 72 hours. Urinalysis    Component Value Date/Time   COLORURINE YELLOW 01/24/2020 0626   APPEARANCEUR HAZY (A) 01/24/2020 0626   LABSPEC 1.021 01/24/2020 0626   PHURINE 5.0 01/24/2020 0626   GLUCOSEU NEGATIVE 01/24/2020 0626   HGBUR LARGE (A) 01/24/2020 0626   BILIRUBINUR NEGATIVE 01/24/2020 0626   KETONESUR 20 (A) 01/24/2020 0626   PROTEINUR 100 (A) 01/24/2020 0626   UROBILINOGEN 1.0 09/23/2013 0732   NITRITE NEGATIVE 01/24/2020 0626   LEUKOCYTESUR NEGATIVE 01/24/2020 0626   Sepsis Labs Invalid input(s): PROCALCITONIN,  WBC,  LACTICIDVEN Microbiology Recent Results (from the past 240 hour(s))  Resp Panel by RT-PCR (Flu A&B, Covid) Nasopharyngeal Swab     Status: None   Collection Time: 05/12/21  2:26 PM   Specimen: Nasopharyngeal Swab; Nasopharyngeal(NP) swabs in vial transport medium  Result Value Ref Range Status   SARS Coronavirus 2 by RT PCR NEGATIVE NEGATIVE Final    Comment: (NOTE) SARS-CoV-2 target nucleic acids are NOT DETECTED.  The SARS-CoV-2 RNA is generally detectable in upper respiratory specimens during the acute phase of infection. The lowest concentration of SARS-CoV-2 viral copies this assay can  detect is 138 copies/mL. A negative result does not preclude SARS-Cov-2 infection and should not be used as the sole basis for treatment or other patient management decisions. A negative result may occur with  improper specimen collection/handling, submission of specimen other than nasopharyngeal swab, presence of viral mutation(s) within  the areas targeted by this assay, and inadequate number of viral copies(<138 copies/mL). A negative result must be combined with clinical observations, patient history, and epidemiological information. The expected result is Negative.  Fact Sheet for Patients:  BloggerCourse.com  Fact Sheet for Healthcare Providers:  SeriousBroker.it  This test is no t yet approved or cleared by the [place] FDA and  has been authorized for detection and/or diagnosis of SARS-CoV-2 by FDA under an Emergency Use Authorization (EUA). This EUA will remain  in effect (meaning this test can be used) for the duration of the COVID-19 declaration under Section 564(b)(1) of the Act, 21 U.S.C.section 360bbb-3(b)(1), unless the authorization is terminated  or revoked sooner.       Influenza A by PCR NEGATIVE NEGATIVE Final   Influenza B by PCR NEGATIVE NEGATIVE Final    Comment: (NOTE) The Xpert Xpress SARS-CoV-2/FLU/RSV plus assay is intended as an aid in the diagnosis of influenza from Nasopharyngeal swab specimens and should not be used as a sole basis for treatment. Nasal washings and aspirates are unacceptable for Xpert Xpress SARS-CoV-2/FLU/RSV testing.  Fact Sheet for Patients: BloggerCourse.com  Fact Sheet for Healthcare Providers: SeriousBroker.it  This test is not yet approved or cleared by the [place] FDA and has been authorized for detection and/or diagnosis of SARS-CoV-2 by FDA under an Emergency Use Authorization (EUA). This EUA will remain in  effect (meaning this test can be used) for the duration of the COVID-19 declaration under Section 564(b)(1) of the Act, 21 U.S.C. section 360bbb-3(b)(1), unless the authorization is terminated or revoked.  Performed at [facility], [address], [place], [place] [address]   Resp Panel by RT-PCR (Flu A&B, Covid) Nasopharyngeal Swab     Status: None   Collection Time: 05/17/21 12:23 PM   Specimen: Nasopharyngeal Swab; Nasopharyngeal(NP) swabs in vial transport medium  Result Value Ref Range Status   SARS Coronavirus 2 by RT PCR NEGATIVE NEGATIVE Final    Comment: (NOTE) SARS-CoV-2 target nucleic acids are NOT DETECTED.  The SARS-CoV-2 RNA is generally detectable in upper respiratory specimens during the acute phase of infection. The lowest concentration of SARS-CoV-2 viral copies this assay can detect is 138 copies/mL. A negative result does not preclude SARS-Cov-2 infection and should not be used as the sole basis for treatment or other patient management decisions. A negative result may occur with  improper specimen collection/handling, submission of specimen other than nasopharyngeal swab, presence of viral mutation(s) within the areas targeted by this assay, and inadequate number of viral copies(<138 copies/mL). A negative result must be combined with clinical observations, patient history, and epidemiological information. The expected result is Negative.  Fact Sheet for Patients:  BloggerCourse.com  Fact Sheet for Healthcare Providers:  SeriousBroker.it  This test is no t yet approved or cleared by the [place] FDA and  has been authorized for detection and/or diagnosis of SARS-CoV-2 by FDA under an Emergency Use Authorization (EUA). This EUA will remain  in effect (meaning this test can be used) for the duration of the COVID-19 declaration under Section 564(b)(1) of the Act, 21 U.S.C.section  360bbb-3(b)(1), unless the authorization is terminated  or revoked sooner.       Influenza A by PCR NEGATIVE NEGATIVE Final   Influenza B by PCR NEGATIVE NEGATIVE Final    Comment: (NOTE) The Xpert Xpress SARS-CoV-2/FLU/RSV plus assay is intended as an aid in the diagnosis of influenza from Nasopharyngeal swab specimens and should not be used as a sole  basis for treatment. Nasal washings and aspirates are unacceptable for Xpert Xpress SARS-CoV-2/FLU/RSV testing.  Fact Sheet for Patients: BloggerCourse.com  Fact Sheet for Healthcare Providers: SeriousBroker.it  This test is not yet approved or cleared by the [place] FDA and has been authorized for detection and/or diagnosis of SARS-CoV-2 by FDA under an Emergency Use Authorization (EUA). This EUA will remain in effect (meaning this test can be used) for the duration of the COVID-19 declaration under Section 564(b)(1) of the Act, 21 U.S.C. section 360bbb-3(b)(1), unless the authorization is terminated or revoked.  Performed at [facility], [address], [place], [place] [address]    Time coordinating discharge: 35 minutes  SIGNED:  Merlene Laughter, DO [facility] 05/19/2021, 1:11 PM Pager is on AMION  If 7PM-7AM, please contact night-coverage [email or web address]

## 2021-05-19 NOTE — Progress Notes (Signed)
Nutrition Follow-up  DOCUMENTATION CODES:   Not applicable  INTERVENTION:  - continue Ensure Enlive TID.  NUTRITION DIAGNOSIS:   Increased nutrient needs related to chronic illness (EtOH abuse) as evidenced by estimated needs. -ongoing  GOAL:   Patient will meet greater than or equal to 90% of their needs -met  MONITOR:   PO intake, Supplement acceptance, Labs, Weight trends, I & O's  ASSESSMENT:   70 yo female with a PMH of essential hypertension, depression/anxiety, history of seizure disorder, history of alcohol withdrawal/seizure/DTs, with current alcohol abuse who presents to [facility] ED on 11/9 following witnessed seizure activity by husband at home.  She has been eating 100% at nearly all meals over the past 3 days. Ensure Enlive ordered TID on 11/10 and she has accepted all but 1 bottle since that time.   She has not been weighed since 11/11 at which time weight was +6 lb compared to weight on 11/9. Mild pitting edema to BLE documented in the edema section of flow sheet.  Discharge order and discharge summary entered earlier this afternoon for d/c to SNF.    Labs reviewed; Na: 133 mmol/l, creatinine: 0.37 mg/dl.  Medications reviewed; 1 mg folvite/day, 1 tablet multivitamin with minerals/day, 250 mg florastor/day, 100 mg thiamine/day.    Diet Order:   Diet Order             Diet - low sodium heart healthy           Diet Heart Room service appropriate? Yes with Assist; Fluid consistency: Thin  Diet effective now                   EDUCATION NEEDS:   No education needs have been identified at this time  Skin:  Skin Assessment: Reviewed RN Assessment  Last BM:  11/16 (type 2 x1)  Height:   Ht Readings from Last 1 Encounters:  05/12/21 5' 5"  (1.651 m)    Weight:   Wt Readings from Last 1 Encounters:  05/14/21 68.7 kg     Estimated Nutritional Needs:  Kcal:  1800-2000 Protein:  85-100 grams Fluid:  >1.8 L      Jarome Matin, MS, RD,  LDN, CNSC Inpatient Clinical Dietitian RD pager # available in AMION  After hours/weekend pager # available in [facility]

## 2021-05-20 NOTE — Care Management Important Message (Signed)
Important Message  Patient Details IM Letter given to the Patient Name: Ashley Bradley MRN: [id number] Date of Birth: [birth year]   Medicare Important Message Given:  Yes     [name] 05/20/2021, 12:31 PM

## 2021-05-20 NOTE — Progress Notes (Signed)
Pt is requesting for home health with [facility] and home health PT with [facility] instead of SNF rehab placement.

## 2021-05-20 NOTE — Discharge Summary (Signed)
Physician Discharge Summary  Ashley Bradley WJX:[id number] DOB: [birth year] DOA: 05/12/2021  PCP: Soundra Pilon, FNP  Admit date: 05/12/2021 Discharge date: 05/20/2021  Admitted From: Home Disposition: Home with Home Health PT/OT/CSW  Recommendations for Outpatient Follow-up:  Follow up with PCP in 1-2 weeks Please obtain CMP/CBC, Mag, Phos in one week Please follow up on the following pending results:  Home Health: No  Equipment/Devices: None  Discharge Condition: Stable  CODE STATUS: FULL CODE  Diet recommendation:   Brief/Interim Summary: The patient is a 70 year old overweight Caucasian female with a past medical history significant for but not limited to hypertension, depression, history of seizure disorder, history of alcohol abuse as well as other comorbidities who was admitted to hospital with a seizure.  It was felt likely the patient had alcohol withdrawal seizure as she had abruptly stopped drinking.  She was continued on her home dose of Keppra and started on CIWA protocol and started on IV fluids.  Overall her condition improved and she had not had any more seizure activity since admission.  Her alcohol withdrawal is improving and she has mild tremors and she has been placed on oral Librium taper which is to be completed today.  She is deemed medically stable to be discharged and she is awaiting skilled nursing facility placement and is stable for discharge once bed is available and once insurance authorization is obtained as well as her PASRR number however patient changed her mind about SNF and wanted to go home so will go home with Home Health Services.  Discharge Diagnoses:  Principal Problem:   Alcohol withdrawal seizure (HCC) Active Problems:   Major depression   Seizure disorder (HCC)   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HCC)   Essential hypertension   Transaminitis   Total bilirubin, elevated  Alcohol withdrawal seizure -Currently on CIWA protocol -She is  chronically on levetiracetam 5 mg p.o. twice daily, husband reports compliance -No evidence of further seizure since admission -Currently on detox protocol with Librium she is to receive her last dose of Librium today -Continue with folic acid 1 mg p.o. daily, multivitamin with minerals 1 tab p.o. daily as well as thiamine 100 mg p.o./IV daily   Alcohol abuse -Seen by TOC, patient is not interested in residential alcohol rehab program -Daily with folic acid 1 mg p.o. daily, multivitamin with minerals daily 1 tab p.o. daily, and thiamine 100 mg p.o/IV. daily -She does plan to connect with AA after discharge   Transaminitis/abnormal LFTs Hyperbilirubinemia -Likely related to alcohol intake -Holding statin for now -AST and ALT had been slightly elevated but mildly improved. -AST went from 99 and trended down to 60 on last check -ALT went from 63 and trended on 61 on last check -T bili trended up slightly and went from 0.9-1.6 on last check -Continue to monitor and trend and repeat CMP within 1 week   Hypokalemia/hypophosphatemia -Related to poor p.o. intake -Given replete meant prior to discharge and potassium level is 3.8 on last check and her phosphorus level was 3.0 on last check -Continue monitor and trend and repeat CMP in the a.m.  Hyponatremia -Mild and essentially stable -Patient's sodium went from 132 -> 134 -> 132 -> 135 -> 133 -Trend and repeat CMP in the outpatient setting within 1 week   Hypertension -Chronically on losartan but was on hold for now since she was normotensive -Continue to monitor blood pressures per protocol -last blood pressure reading was 118/61   Depression and anxiety -Continue on  fluoxetine 40 g p.o. daily   Cerebral palsy -Chronic right lower extremity weakness   Right arm pain -She does have reported history of fall, although it is unclear when exactly she fell -Appears to have pain with decreased range of motion in her right elbow and  shoulder -X-rays of elbow and shoulder are unremarkable -She also describes intermittent swelling, venous Dopplers negative -Follow-up with orthopedic surgery in the outpatient setting   Generalized weakness -Seen by physical therapy with recommendations for skilled nursing facility placement -Her husband said that approximately a month ago she was ambulating independently -Generalized weakness progressed over the past month since she was mostly laying in bed and drinking -Patient is agreeable for skilled nursing facility placement -She is stable for discharge once a bed is available; bed is available and she is informed authorization but currently awaiting PASRR  Discharge Instructions  Discharge Instructions     Call MD for:  difficulty breathing, headache or visual disturbances   Complete by: As directed    Call MD for:  extreme fatigue   Complete by: As directed    Call MD for:  hives   Complete by: As directed    Call MD for:  persistant dizziness or light-headedness   Complete by: As directed    Call MD for:  persistant nausea and vomiting   Complete by: As directed    Call MD for:  redness, tenderness, or signs of infection (pain, swelling, redness, odor or green/yellow discharge around incision site)   Complete by: As directed    Call MD for:  severe uncontrolled pain   Complete by: As directed    Call MD for:  temperature >100.4   Complete by: As directed    Diet - low sodium heart healthy   Complete by: As directed    Discharge instructions   Complete by: As directed    You were cared for by a hospitalist during your hospital stay. If you have any questions about your discharge medications or the care you received while you were in the hospital after you are discharged, you can call the unit and ask to speak with the hospitalist on call if the hospitalist that took care of you is not available. Once you are discharged, your primary care physician will handle any further  medical issues. Please note that NO REFILLS for any discharge medications will be authorized once you are discharged, as it is imperative that you return to your primary care physician (or establish a relationship with a primary care physician if you do not have one) for your aftercare needs so that they can reassess your need for medications and monitor your lab values.  Follow up with PCP and follow up with Resources provided to you by TOC. Take all medications as prescribed. If symptoms change or worsen please return to the ED for evaluation   Increase activity slowly   Complete by: As directed       Allergies as of 05/20/2021       Reactions   Ancef [cefazolin] Other (See Comments)   Skin blisters, peeling.   Pantopaque [iophendylate] Other (See Comments)   Aseptic meningitis.   Macrodantin [nitrofurantoin] Nausea And Vomiting   Morphine And Related Nausea And Vomiting   Per patient, only has reaction when given IV. Okay to take PO.        Medication List     TAKE these medications    aspirin EC 81 MG tablet Take 81 mg by  mouth daily.   atorvastatin 10 MG tablet Commonly known as: LIPITOR Take 10 mg by mouth daily.   BIOTIN PO Take 5,000 mcg by mouth daily.   CALCIUM PO Take 500 mg by mouth daily.   feeding supplement Liqd Take 237 mLs by mouth 3 (three) times daily between meals.   FLUoxetine 40 MG capsule Commonly known as: PROZAC Take 40 mg by mouth every morning.   folic acid 1 MG tablet Commonly known as: FOLVITE Take 1 tablet (1 mg total) by mouth daily.   levETIRAcetam 500 MG tablet Commonly known as: KEPPRA Take 1 tablet (500 mg total) by mouth 2 (two) times daily.   losartan 100 MG tablet Commonly known as: COZAAR Take 50 mg by mouth daily.   multivitamin with minerals Tabs tablet Take 1 tablet by mouth daily.   saccharomyces boulardii 250 MG capsule Commonly known as: FLORASTOR Take 250 mg by mouth 2 (two) times daily.   thiamine 100 MG  tablet Take 1 tablet (100 mg total) by mouth daily.   VITAMIN D PO Take 2,000 Units by mouth daily.        Contact information for after-discharge care     Destination     [facility] Preferred SNF .   Service: Skilled Nursing Contact information: [address] [place] [address] [phone]                    Allergies  Allergen Reactions   Ancef [Cefazolin] Other (See Comments)    Skin blisters, peeling.   Pantopaque [Iophendylate] Other (See Comments)    Aseptic meningitis.   Macrodantin [Nitrofurantoin] Nausea And Vomiting   Morphine And Related Nausea And Vomiting    Per patient, only has reaction when given IV. Okay to take PO.   Consultations: None  Procedures/Studies: DG Shoulder Right  Result Date: 05/14/2021 CLINICAL DATA:  Fall EXAM: RIGHT ELBOW - 2 VIEW; RIGHT SHOULDER - 2+ VIEW COMPARISON:  None. FINDINGS: Right shoulder: There is no evidence of fracture, dislocation, or joint effusion. There is no evidence of arthropathy or other focal bone abnormality. Soft tissues are unremarkable. Right elbow: No evidence of fracture, dislocation, or definite joint effusion. No evidence of severe arthropathy. No aggressive appearing focal bone abnormality. Soft tissues are unremarkable. IMPRESSION: No acute displaced fracture or dislocation of the right shoulder and elbow. Electronically Signed   By: Tish Frederickson M.D.   On: 05/14/2021 19:08   DG Elbow 2 Views Right  Result Date: 05/14/2021 CLINICAL DATA:  Fall EXAM: RIGHT ELBOW - 2 VIEW; RIGHT SHOULDER - 2+ VIEW COMPARISON:  None. FINDINGS: Right shoulder: There is no evidence of fracture, dislocation, or joint effusion. There is no evidence of arthropathy or other focal bone abnormality. Soft tissues are unremarkable. Right elbow: No evidence of fracture, dislocation, or definite joint effusion. No evidence of severe arthropathy. No aggressive appearing focal bone  abnormality. Soft tissues are unremarkable. IMPRESSION: No acute displaced fracture or dislocation of the right shoulder and elbow. Electronically Signed   By: Tish Frederickson M.D.   On: 05/14/2021 19:08   CT Head Wo Contrast  Result Date: 05/12/2021 CLINICAL DATA:  Seizure. EXAM: CT HEAD WITHOUT CONTRAST TECHNIQUE: Contiguous axial images were obtained from the base of the skull through the vertex without intravenous contrast. COMPARISON:  CT head dated January 24, 2020. FINDINGS: Brain: No evidence of acute infarction, hemorrhage, hydrocephalus, extra-axial collection or mass lesion/mass effect. Stable mild atrophy. Vascular: No hyperdense vessel  or unexpected calcification. Skull: Normal. Negative for fracture or focal lesion. Sinuses/Orbits: No acute finding. Other: None. IMPRESSION: 1.  No acute intracranial abnormality. Electronically Signed   By: Obie Dredge M.D.   On: 05/12/2021 11:07   VAS [place] UPPER EXTREMITY VENOUS DUPLEX  Result Date: 05/17/2021 UPPER VENOUS STUDY  Patient Name:  FELICITE ZEIMET  Date of Exam:   05/16/2021 Medical Rec #: [id number]        Accession #:    [id number] Date of Birth: [birth year]        Patient Gender: F Patient Age:   35 years Exam Location:  [facility] Procedure:      VAS [place] UPPER EXTREMITY VENOUS DUPLEX Referring Phys: Durward Mallard MEMON --------------------------------------------------------------------------------  Indications: Edema Comparison Study: No prior study Performing Technologist: Gertie Fey MHA, RDMS, RVT, RDCS  Examination Guidelines: A complete evaluation includes B-mode imaging, spectral Doppler, color Doppler, and power Doppler as needed of all accessible portions of each vessel. Bilateral testing is considered an integral part of a complete examination. Limited examinations for reoccurring indications may be performed as noted.  Right Findings: +----------+------------+---------+-----------+----------+-------+ RIGHT      CompressiblePhasicitySpontaneousPropertiesSummary +----------+------------+---------+-----------+----------+-------+ IJV           Full       Yes       Yes                      +----------+------------+---------+-----------+----------+-------+ Subclavian    Full       Yes       Yes                      +----------+------------+---------+-----------+----------+-------+ Axillary      Full       Yes       Yes                      +----------+------------+---------+-----------+----------+-------+ Brachial      Full       Yes       Yes                      +----------+------------+---------+-----------+----------+-------+ Radial        Full                                          +----------+------------+---------+-----------+----------+-------+ Ulnar         Full                                          +----------+------------+---------+-----------+----------+-------+ Cephalic      Full                                          +----------+------------+---------+-----------+----------+-------+ Basilic       Full                                          +----------+------------+---------+-----------+----------+-------+  Left Findings: +----------+------------+---------+-----------+----------+-------+ LEFT      CompressiblePhasicitySpontaneousPropertiesSummary +----------+------------+---------+-----------+----------+-------+ Subclavian  Yes       Yes                      +----------+------------+---------+-----------+----------+-------+  Summary:  Right: No evidence of deep vein thrombosis in the upper extremity. No evidence of superficial vein thrombosis in the upper extremity.  Left: No evidence of thrombosis in the subclavian.  *See table(s) above for measurements and observations.  Diagnosing physician: Sherald Hess MD Electronically signed by Sherald Hess MD on 05/17/2021 at 11:57:45 AM.    Final       Subjective: Seen and examined at bedside and she was medically stable to be discharged to SNF.  States that she had some mild arm pain.  No chest pain or shortness of breath. Stable to D/C to SNF  Discharge Exam: Vitals:   05/19/21 2056 05/20/21 0449  BP: (!) 101/52 (!) 105/59  Pulse: 96 84  Resp: 18 18  Temp: 97.7 F (36.5 C) 97.6 F (36.4 C)  SpO2: 94% 99%   Vitals:   05/19/21 0402 05/19/21 1327 05/19/21 2056 05/20/21 0449  BP: 118/61 117/63 (!) 101/52 (!) 105/59  Pulse: 86 89 96 84  Resp: Temp: 98.2 F (36.8 C) 99 F (37.2 C) 97.7 F (36.5 C) 97.6 F (36.4 C)  TempSrc: Oral Oral Oral Oral  SpO2: 94% 94% 94% 99%  Weight:      Height:       General: Pt is alert, awake, not in acute distress Cardiovascular: RRR, S1/S2 +, no rubs, no gallops Respiratory: Diminished bilaterally, no wheezing, no rhonchi; Unlabored breathing  Abdominal: Soft, NT, slightly distended, bowel sounds + Extremities: no edema, no cyanosis  The results of significant diagnostics from this hospitalization (including imaging, microbiology, ancillary and laboratory) are listed below for reference.    Microbiology: Recent Results (from the past 240 hour(s))  Resp Panel by RT-PCR (Flu A&B, Covid) Nasopharyngeal Swab     Status: None   Collection Time: 05/12/21  2:26 PM   Specimen: Nasopharyngeal Swab; Nasopharyngeal(NP) swabs in vial transport medium  Result Value Ref Range Status   SARS Coronavirus 2 by RT PCR NEGATIVE NEGATIVE Final    Comment: (NOTE) SARS-CoV-2 target nucleic acids are NOT DETECTED.  The SARS-CoV-2 RNA is generally detectable in upper respiratory specimens during the acute phase of infection. The lowest concentration of SARS-CoV-2 viral copies this assay can detect is 138 copies/mL. A negative result does not preclude SARS-Cov-2 infection and should not be used as the sole basis for treatment or other patient management decisions. A negative result may occur  with  improper specimen collection/handling, submission of specimen other than nasopharyngeal swab, presence of viral mutation(s) within the areas targeted by this assay, and inadequate number of viral copies(<138 copies/mL). A negative result must be combined with clinical observations, patient history, and epidemiological information. The expected result is Negative.  Fact Sheet for Patients:  BloggerCourse.com  Fact Sheet for Healthcare Providers:  SeriousBroker.it  This test is no t yet approved or cleared by the [place] FDA and  has been authorized for detection and/or diagnosis of SARS-CoV-2 by FDA under an Emergency Use Authorization (EUA). This EUA will remain  in effect (meaning this test can be used) for the duration of the COVID-19 declaration under Section 564(b)(1) of the Act, 21 U.S.C.section 360bbb-3(b)(1), unless the authorization is terminated  or revoked sooner.       Influenza A by PCR NEGATIVE NEGATIVE Final   Influenza B  by PCR NEGATIVE NEGATIVE Final    Comment: (NOTE) The Xpert Xpress SARS-CoV-2/FLU/RSV plus assay is intended as an aid in the diagnosis of influenza from Nasopharyngeal swab specimens and should not be used as a sole basis for treatment. Nasal washings and aspirates are unacceptable for Xpert Xpress SARS-CoV-2/FLU/RSV testing.  Fact Sheet for Patients: BloggerCourse.com  Fact Sheet for Healthcare Providers: SeriousBroker.it  This test is not yet approved or cleared by the [place] FDA and has been authorized for detection and/or diagnosis of SARS-CoV-2 by FDA under an Emergency Use Authorization (EUA). This EUA will remain in effect (meaning this test can be used) for the duration of the COVID-19 declaration under Section 564(b)(1) of the Act, 21 U.S.C. section 360bbb-3(b)(1), unless the authorization is terminated  or revoked.  Performed at [facility] [address], [place], [place] [address]   Resp Panel by RT-PCR (Flu A&B, Covid) Nasopharyngeal Swab     Status: None   Collection Time: 05/17/21 12:23 PM   Specimen: Nasopharyngeal Swab; Nasopharyngeal(NP) swabs in vial transport medium  Result Value Ref Range Status   SARS Coronavirus 2 by RT PCR NEGATIVE NEGATIVE Final    Comment: (NOTE) SARS-CoV-2 target nucleic acids are NOT DETECTED.  The SARS-CoV-2 RNA is generally detectable in upper respiratory specimens during the acute phase of infection. The lowest concentration of SARS-CoV-2 viral copies this assay can detect is 138 copies/mL. A negative result does not preclude SARS-Cov-2 infection and should not be used as the sole basis for treatment or other patient management decisions. A negative result may occur with  improper specimen collection/handling, submission of specimen other than nasopharyngeal swab, presence of viral mutation(s) within the areas targeted by this assay, and inadequate number of viral copies(<138 copies/mL). A negative result must be combined with clinical observations, patient history, and epidemiological information. The expected result is Negative.  Fact Sheet for Patients:  BloggerCourse.com  Fact Sheet for Healthcare Providers:  SeriousBroker.it  This test is no t yet approved or cleared by the [place] FDA and  has been authorized for detection and/or diagnosis of SARS-CoV-2 by FDA under an Emergency Use Authorization (EUA). This EUA will remain  in effect (meaning this test can be used) for the duration of the COVID-19 declaration under Section 564(b)(1) of the Act, 21 U.S.C.section 360bbb-3(b)(1), unless the authorization is terminated  or revoked sooner.       Influenza A by PCR NEGATIVE NEGATIVE Final   Influenza B by PCR NEGATIVE NEGATIVE Final    Comment: (NOTE) The  Xpert Xpress SARS-CoV-2/FLU/RSV plus assay is intended as an aid in the diagnosis of influenza from Nasopharyngeal swab specimens and should not be used as a sole basis for treatment. Nasal washings and aspirates are unacceptable for Xpert Xpress SARS-CoV-2/FLU/RSV testing.  Fact Sheet for Patients: BloggerCourse.com  Fact Sheet for Healthcare Providers: SeriousBroker.it  This test is not yet approved or cleared by the [place] FDA and has been authorized for detection and/or diagnosis of SARS-CoV-2 by FDA under an Emergency Use Authorization (EUA). This EUA will remain in effect (meaning this test can be used) for the duration of the COVID-19 declaration under Section 564(b)(1) of the Act, 21 U.S.C. section 360bbb-3(b)(1), unless the authorization is terminated or revoked.  Performed at [facility], [address], [place], [place] [address]     Labs: BNP (last 3 results) No results for input(s): BNP in the last 8760 hours. Basic Metabolic Panel: Recent Labs  Lab 05/14/21  7829 05/14/21 1937 05/15/21 0529 05/16/21 0524 05/17/21 1230 05/19/21 0437  NA 134*  --  132* 135 133*  --   K 3.4*  --  3.2* 4.0 3.8  --   CL 104  --  97* 104 100  --   CO2 24  --  --   GLUCOSE 105*  --  82 103* 106*  --   BUN 13  --  6* 15 9  --   CREATININE 0.33*  --  <0.30* 0.32* <0.30* 0.37*  CALCIUM 8.0*  --  8.8* 9.1 9.2  --   MG 1.9  --  1.9  --  1.9  --   PHOS 1.7* 5.2* 3.0  --   --   --     Liver Function Tests: Recent Labs  Lab 05/14/21 0434 05/15/21 0529  AST 99* 64*  ALT 63* 61*  ALKPHOS 137* 157*  BILITOT 0.9 1.6*  PROT 5.5* 6.6  ALBUMIN 2.9* 3.5    No results for input(s): LIPASE, AMYLASE in the last 168 hours. No results for input(s): AMMONIA in the last 168 hours. CBC: Recent Labs  Lab 05/14/21 0434 05/15/21 0529 05/16/21 0524  WBC 5.9 4.9 4.9  HGB 11.9* 14.1 13.6  HCT 35.9*  40.6 40.5  MCV 102.0* 97.8 99.5  PLT 126* 154 191    Cardiac Enzymes: No results for input(s): CKTOTAL, CKMB, CKMBINDEX, TROPONINI in the last 168 hours. BNP: Invalid input(s): POCBNP CBG: No results for input(s): GLUCAP in the last 168 hours. D-Dimer No results for input(s): DDIMER in the last 72 hours. Hgb A1c No results for input(s): HGBA1C in the last 72 hours. Lipid Profile No results for input(s): CHOL, HDL, LDLCALC, TRIG, CHOLHDL, LDLDIRECT in the last 72 hours. Thyroid function studies No results for input(s): TSH, T4TOTAL, T3FREE, THYROIDAB in the last 72 hours.  Invalid input(s): FREET3 Anemia work up No results for input(s): VITAMINB12, FOLATE, FERRITIN, TIBC, IRON, RETICCTPCT in the last 72 hours. Urinalysis    Component Value Date/Time   COLORURINE YELLOW 01/24/2020 0626   APPEARANCEUR HAZY (A) 01/24/2020 0626   LABSPEC 1.021 01/24/2020 0626   PHURINE 5.0 01/24/2020 0626   GLUCOSEU NEGATIVE 01/24/2020 0626   HGBUR LARGE (A) 01/24/2020 0626   BILIRUBINUR NEGATIVE 01/24/2020 0626   KETONESUR 20 (A) 01/24/2020 0626   PROTEINUR 100 (A) 01/24/2020 0626   UROBILINOGEN 1.0 09/23/2013 0732   NITRITE NEGATIVE 01/24/2020 0626   LEUKOCYTESUR NEGATIVE 01/24/2020 0626   Sepsis Labs Invalid input(s): PROCALCITONIN,  WBC,  LACTICIDVEN Microbiology Recent Results (from the past 240 hour(s))  Resp Panel by RT-PCR (Flu A&B, Covid) Nasopharyngeal Swab     Status: None   Collection Time: 05/12/21  2:26 PM   Specimen: Nasopharyngeal Swab; Nasopharyngeal(NP) swabs in vial transport medium  Result Value Ref Range Status   SARS Coronavirus 2 by RT PCR NEGATIVE NEGATIVE Final    Comment: (NOTE) SARS-CoV-2 target nucleic acids are NOT DETECTED.  The SARS-CoV-2 RNA is generally detectable in upper respiratory specimens during the acute phase of infection. The lowest concentration of SARS-CoV-2 viral copies this assay can detect is 138 copies/mL. A negative result does not  preclude SARS-Cov-2 infection and should not be used as the sole basis for treatment or other patient management decisions. A negative result may occur with  improper specimen collection/handling, submission of specimen other than nasopharyngeal swab, presence of viral mutation(s) within the areas targeted by this assay, and inadequate number of viral copies(<138 copies/mL).  A negative result must be combined with clinical observations, patient history, and epidemiological information. The expected result is Negative.  Fact Sheet for Patients:  BloggerCourse.com  Fact Sheet for Healthcare Providers:  SeriousBroker.it  This test is no t yet approved or cleared by the [place] FDA and  has been authorized for detection and/or diagnosis of SARS-CoV-2 by FDA under an Emergency Use Authorization (EUA). This EUA will remain  in effect (meaning this test can be used) for the duration of the COVID-19 declaration under Section 564(b)(1) of the Act, 21 U.S.C.section 360bbb-3(b)(1), unless the authorization is terminated  or revoked sooner.       Influenza A by PCR NEGATIVE NEGATIVE Final   Influenza B by PCR NEGATIVE NEGATIVE Final    Comment: (NOTE) The Xpert Xpress SARS-CoV-2/FLU/RSV plus assay is intended as an aid in the diagnosis of influenza from Nasopharyngeal swab specimens and should not be used as a sole basis for treatment. Nasal washings and aspirates are unacceptable for Xpert Xpress SARS-CoV-2/FLU/RSV testing.  Fact Sheet for Patients: BloggerCourse.com  Fact Sheet for Healthcare Providers: SeriousBroker.it  This test is not yet approved or cleared by the [place] FDA and has been authorized for detection and/or diagnosis of SARS-CoV-2 by FDA under an Emergency Use Authorization (EUA). This EUA will remain in effect (meaning this test can be used) for the  duration of the COVID-19 declaration under Section 564(b)(1) of the Act, 21 U.S.C. section 360bbb-3(b)(1), unless the authorization is terminated or revoked.  Performed at [facility], [address] [place], [place] [address]   Resp Panel by RT-PCR (Flu A&B, Covid) Nasopharyngeal Swab     Status: None   Collection Time: 05/17/21 12:23 PM   Specimen: Nasopharyngeal Swab; Nasopharyngeal(NP) swabs in vial transport medium  Result Value Ref Range Status   SARS Coronavirus 2 by RT PCR NEGATIVE NEGATIVE Final    Comment: (NOTE) SARS-CoV-2 target nucleic acids are NOT DETECTED.  The SARS-CoV-2 RNA is generally detectable in upper respiratory specimens during the acute phase of infection. The lowest concentration of SARS-CoV-2 viral copies this assay can detect is 138 copies/mL. A negative result does not preclude SARS-Cov-2 infection and should not be used as the sole basis for treatment or other patient management decisions. A negative result may occur with  improper specimen collection/handling, submission of specimen other than nasopharyngeal swab, presence of viral mutation(s) within the areas targeted by this assay, and inadequate number of viral copies(<138 copies/mL). A negative result must be combined with clinical observations, patient history, and epidemiological information. The expected result is Negative.  Fact Sheet for Patients:  BloggerCourse.com  Fact Sheet for Healthcare Providers:  SeriousBroker.it  This test is no t yet approved or cleared by the [place] FDA and  has been authorized for detection and/or diagnosis of SARS-CoV-2 by FDA under an Emergency Use Authorization (EUA). This EUA will remain  in effect (meaning this test can be used) for the duration of the COVID-19 declaration under Section 564(b)(1) of the Act, 21 U.S.C.section 360bbb-3(b)(1), unless the authorization is  terminated  or revoked sooner.       Influenza A by PCR NEGATIVE NEGATIVE Final   Influenza B by PCR NEGATIVE NEGATIVE Final    Comment: (NOTE) The Xpert Xpress SARS-CoV-2/FLU/RSV plus assay is intended as an aid in the diagnosis of influenza from Nasopharyngeal swab specimens and should not be used as a sole basis for treatment. Nasal washings and aspirates are unacceptable for Xpert Xpress SARS-CoV-2/FLU/RSV  testing.  Fact Sheet for Patients: BloggerCourse.com  Fact Sheet for Healthcare Providers: SeriousBroker.it  This test is not yet approved or cleared by the [place] FDA and has been authorized for detection and/or diagnosis of SARS-CoV-2 by FDA under an Emergency Use Authorization (EUA). This EUA will remain in effect (meaning this test can be used) for the duration of the COVID-19 declaration under Section 564(b)(1) of the Act, 21 U.S.C. section 360bbb-3(b)(1), unless the authorization is terminated or revoked.  Performed at [facility], [address], [place], [place] [address]    Time coordinating discharge: 35 minutes  SIGNED:  Merlene Laughter, DO [facility] 05/20/2021, 11:24 AM Pager is on AMION  If 7PM-7AM, please contact night-coverage [email or web address]

## 2021-05-20 NOTE — TOC Transition Note (Addendum)
Transition of Care [facility]) - CM/SW Discharge Note   Patient Details  Name: Ashley Bradley MRN: [id number] Date of Birth: [birth year]  Transition of Care [facility]) CM/SW Contact:  Lanier Clam, RN Phone Number: 05/20/2021, 9:45 AM   Clinical Narrative: Noted patient wants home w/HHC-HHPT/OT/CSW-[facility] rep Marylene Land accepted. TC [facility] rep Revonda Standard  about private duty care per  noted request.Will confirm PTAR for transport. 11a-PTAR scheduled for 1p pick up.No further CM needs.    Final next level of care: Home w Home Health Services Barriers to Discharge: No Barriers Identified   Patient Goals and CMS Choice Patient states their goals for this hospitalization and ongoing recovery are:: go home CMS Medicare.gov Compare Post Acute Care list provided to:: Patient Choice offered to / list presented to : Patient  Discharge Placement                       Discharge Plan and Services In-house Referral: Clinical Social Work Discharge Planning Services: CM Consult Post Acute Care Choice: Home Health                      [facility] Agency: [facility] Date [facility] Agency Contacted: 05/20/21 Time HH Agency Contacted: [phone] Representative spoke with at [facility] Agency: Marylene Land  Social Determinants of Health (SDOH) Interventions     Readmission Risk Interventions No flowsheet data found.

## 2022-03-22 ENCOUNTER — Encounter (HOSPITAL_COMMUNITY): Payer: Self-pay | Admitting: Emergency Medicine

## 2022-03-22 ENCOUNTER — Inpatient Hospital Stay (HOSPITAL_COMMUNITY)
Admission: EM | Admit: 2022-03-22 | Discharge: 2022-04-12 | DRG: 897 | Disposition: A | Discharge status: Skilled Nursing Facility | Payer: Medicare Other | Attending: Internal Medicine | Admitting: Internal Medicine

## 2022-03-22 ENCOUNTER — Emergency Department (HOSPITAL_COMMUNITY): Payer: Medicare Other

## 2022-03-22 DIAGNOSIS — E876 Hypokalemia: Secondary | ICD-10-CM | POA: Diagnosis not present

## 2022-03-22 DIAGNOSIS — R5381 Other malaise: Secondary | ICD-10-CM | POA: Diagnosis not present

## 2022-03-22 DIAGNOSIS — M6281 Muscle weakness (generalized): Secondary | ICD-10-CM | POA: Diagnosis present

## 2022-03-22 DIAGNOSIS — G802 Spastic hemiplegic cerebral palsy: Secondary | ICD-10-CM | POA: Diagnosis present

## 2022-03-22 DIAGNOSIS — F329 Major depressive disorder, single episode, unspecified: Secondary | ICD-10-CM | POA: Diagnosis present

## 2022-03-22 DIAGNOSIS — Z23 Encounter for immunization: Secondary | ICD-10-CM | POA: Diagnosis present

## 2022-03-22 DIAGNOSIS — E871 Hypo-osmolality and hyponatremia: Secondary | ICD-10-CM | POA: Diagnosis present

## 2022-03-22 DIAGNOSIS — Z634 Disappearance and death of family member: Secondary | ICD-10-CM

## 2022-03-22 DIAGNOSIS — R509 Fever, unspecified: Secondary | ICD-10-CM | POA: Diagnosis not present

## 2022-03-22 DIAGNOSIS — F10931 Alcohol use, unspecified with withdrawal delirium: Secondary | ICD-10-CM | POA: Diagnosis not present

## 2022-03-22 DIAGNOSIS — Z888 Allergy status to other drugs, medicaments and biological substances status: Secondary | ICD-10-CM

## 2022-03-22 DIAGNOSIS — Z885 Allergy status to narcotic agent status: Secondary | ICD-10-CM

## 2022-03-22 DIAGNOSIS — Z79899 Other long term (current) drug therapy: Secondary | ICD-10-CM

## 2022-03-22 DIAGNOSIS — Z801 Family history of malignant neoplasm of trachea, bronchus and lung: Secondary | ICD-10-CM

## 2022-03-22 DIAGNOSIS — F10930 Alcohol use, unspecified with withdrawal, uncomplicated: Principal | ICD-10-CM

## 2022-03-22 DIAGNOSIS — F339 Major depressive disorder, recurrent, unspecified: Secondary | ICD-10-CM | POA: Diagnosis not present

## 2022-03-22 DIAGNOSIS — F322 Major depressive disorder, single episode, severe without psychotic features: Secondary | ICD-10-CM | POA: Diagnosis present

## 2022-03-22 DIAGNOSIS — Z881 Allergy status to other antibiotic agents status: Secondary | ICD-10-CM

## 2022-03-22 DIAGNOSIS — Z8619 Personal history of other infectious and parasitic diseases: Secondary | ICD-10-CM

## 2022-03-22 DIAGNOSIS — F10939 Alcohol use, unspecified with withdrawal, unspecified: Secondary | ICD-10-CM | POA: Diagnosis not present

## 2022-03-22 DIAGNOSIS — R197 Diarrhea, unspecified: Secondary | ICD-10-CM

## 2022-03-22 DIAGNOSIS — R000 Tachycardia, unspecified: Secondary | ICD-10-CM | POA: Diagnosis present

## 2022-03-22 DIAGNOSIS — D692 Other nonthrombocytopenic purpura: Secondary | ICD-10-CM | POA: Diagnosis present

## 2022-03-22 DIAGNOSIS — R21 Rash and other nonspecific skin eruption: Secondary | ICD-10-CM

## 2022-03-22 DIAGNOSIS — L309 Dermatitis, unspecified: Secondary | ICD-10-CM | POA: Diagnosis not present

## 2022-03-22 DIAGNOSIS — I10 Essential (primary) hypertension: Secondary | ICD-10-CM | POA: Diagnosis present

## 2022-03-22 DIAGNOSIS — R9431 Abnormal electrocardiogram [ECG] [EKG]: Secondary | ICD-10-CM | POA: Diagnosis present

## 2022-03-22 DIAGNOSIS — E785 Hyperlipidemia, unspecified: Secondary | ICD-10-CM | POA: Diagnosis present

## 2022-03-22 DIAGNOSIS — E860 Dehydration: Secondary | ICD-10-CM | POA: Diagnosis present

## 2022-03-22 DIAGNOSIS — L249 Irritant contact dermatitis, unspecified cause: Secondary | ICD-10-CM | POA: Diagnosis present

## 2022-03-22 DIAGNOSIS — Z8249 Family history of ischemic heart disease and other diseases of the circulatory system: Secondary | ICD-10-CM

## 2022-03-22 DIAGNOSIS — R059 Cough, unspecified: Secondary | ICD-10-CM | POA: Diagnosis not present

## 2022-03-22 DIAGNOSIS — F10230 Alcohol dependence with withdrawal, uncomplicated: Principal | ICD-10-CM | POA: Diagnosis present

## 2022-03-22 DIAGNOSIS — Z7982 Long term (current) use of aspirin: Secondary | ICD-10-CM

## 2022-03-22 DIAGNOSIS — S025XXA Fracture of tooth (traumatic), initial encounter for closed fracture: Secondary | ICD-10-CM | POA: Diagnosis present

## 2022-03-22 DIAGNOSIS — G40909 Epilepsy, unspecified, not intractable, without status epilepticus: Secondary | ICD-10-CM | POA: Diagnosis present

## 2022-03-22 DIAGNOSIS — Z7401 Bed confinement status: Secondary | ICD-10-CM

## 2022-03-22 DIAGNOSIS — K7030 Alcoholic cirrhosis of liver without ascites: Secondary | ICD-10-CM | POA: Diagnosis present

## 2022-03-22 DIAGNOSIS — L219 Seborrheic dermatitis, unspecified: Secondary | ICD-10-CM | POA: Diagnosis present

## 2022-03-22 LAB — MAGNESIUM: Magnesium: 2 mg/dL (ref 1.7–2.4)

## 2022-03-22 LAB — CBC
HCT: 41 % (ref 36.0–46.0)
Hemoglobin: 14.2 g/dL (ref 12.0–15.0)
MCH: 32.9 pg (ref 26.0–34.0)
MCHC: 34.6 g/dL (ref 30.0–36.0)
MCV: 94.9 fL (ref 80.0–100.0)
Platelets: 252 10*3/uL (ref 150–400)
RBC: 4.32 MIL/uL (ref 3.87–5.11)
RDW: 13.5 % (ref 11.5–15.5)
WBC: 7.9 10*3/uL (ref 4.0–10.5)
nRBC: 0 % (ref 0.0–0.2)

## 2022-03-22 LAB — FOLATE: Folate: 18.7 ng/mL (ref >5.9)

## 2022-03-22 LAB — COMPREHENSIVE METABOLIC PANEL
ALT: 60 U/L — ABNORMAL HIGH (ref 0–44)
AST: 74 U/L — ABNORMAL HIGH (ref 15–41)
Albumin: 3.6 g/dL (ref 3.5–5.0)
Alkaline Phosphatase: 202 U/L — ABNORMAL HIGH (ref 38–126)
Anion gap: 11 (ref 5–15)
BUN: <5 mg/dL — ABNORMAL LOW (ref 8–23)
CO2: 24 mmol/L (ref 22–32)
Calcium: 9.9 mg/dL (ref 8.9–10.3)
Chloride: 99 mmol/L (ref 98–111)
Creatinine, Ser: 0.43 mg/dL — ABNORMAL LOW (ref 0.44–1.00)
GFR, Estimated: >60 mL/min (ref >60)
Glucose, Bld: 125 mg/dL — ABNORMAL HIGH (ref 70–99)
Potassium: 3.7 mmol/L (ref 3.5–5.1)
Sodium: 134 mmol/L — ABNORMAL LOW (ref 135–145)
Total Bilirubin: 1.8 mg/dL — ABNORMAL HIGH (ref 0.3–1.2)
Total Protein: 7.3 g/dL (ref 6.5–8.1)

## 2022-03-22 LAB — BILIRUBIN, DIRECT: Bilirubin, Direct: 0.4 mg/dL — ABNORMAL HIGH (ref 0.0–0.2)

## 2022-03-22 LAB — AMMONIA: Ammonia: 20 umol/L (ref 9–35)

## 2022-03-22 LAB — PROTIME-INR
INR: 1.1 (ref 0.8–1.2)
Prothrombin Time: 14.2 seconds (ref 11.4–15.2)

## 2022-03-22 LAB — LACTATE DEHYDROGENASE: LDH: 125 U/L (ref 98–192)

## 2022-03-22 LAB — RETICULOCYTES
Immature Retic Fract: 8.8 % (ref 2.3–15.9)
RBC.: 4.25 MIL/uL (ref 3.87–5.11)
Retic Count, Absolute: 81.6 10*3/uL (ref 19.0–186.0)
Retic Ct Pct: 1.9 % (ref 0.4–3.1)

## 2022-03-22 LAB — TSH: TSH: 3.239 u[IU]/mL (ref 0.350–4.500)

## 2022-03-22 LAB — HIV ANTIBODY (ROUTINE TESTING W REFLEX): HIV Screen 4th Generation wRfx: NONREACTIVE

## 2022-03-22 LAB — FIBRINOGEN: Fibrinogen: 350 mg/dL (ref 210–475)

## 2022-03-22 LAB — ETHANOL: Alcohol, Ethyl (B): <10 mg/dL (ref <10)

## 2022-03-22 LAB — LIPASE, BLOOD: Lipase: 32 U/L (ref 11–51)

## 2022-03-22 LAB — VITAMIN B12: Vitamin B-12: 1133 pg/mL — ABNORMAL HIGH (ref 180–914)

## 2022-03-22 MED ORDER — ADULT MULTIVITAMIN W/MINERALS CH
1.0000 | ORAL_TABLET | Freq: Every day | ORAL | Status: DC
  Administered 2022-03-22 – 2022-04-12 (×22): 1 via ORAL
  Filled 2022-03-22 (×22): qty 1

## 2022-03-22 MED ORDER — KETOCONAZOLE 2 % EX CREA
TOPICAL_CREAM | Freq: Every day | CUTANEOUS | Status: DC
  Filled 2022-03-22 (×7): qty 15

## 2022-03-22 MED ORDER — THIAMINE MONONITRATE 100 MG PO TABS
100.0000 mg | ORAL_TABLET | Freq: Every day | ORAL | Status: DC
  Administered 2022-03-22 – 2022-04-12 (×22): 100 mg via ORAL
  Filled 2022-03-22 (×23): qty 1

## 2022-03-22 MED ORDER — LEVETIRACETAM 500 MG PO TABS
500.0000 mg | ORAL_TABLET | Freq: Two times a day (BID) | ORAL | Status: DC
  Administered 2022-03-22 – 2022-04-12 (×42): 500 mg via ORAL
  Filled 2022-03-22 (×42): qty 1

## 2022-03-22 MED ORDER — LORAZEPAM 2 MG/ML IJ SOLN
1.0000 mg | INTRAMUSCULAR | Status: DC | PRN
  Administered 2022-03-22: 1 mg via INTRAVENOUS
  Administered 2022-03-22 – 2022-03-24 (×5): 2 mg via INTRAVENOUS
  Administered 2022-03-24: 1 mg via INTRAVENOUS
  Administered 2022-03-24: 3 mg via INTRAVENOUS
  Administered 2022-03-24: 2 mg via INTRAVENOUS
  Administered 2022-03-25: 3 mg via INTRAVENOUS
  Administered 2022-03-25: 2 mg via INTRAVENOUS
  Filled 2022-03-22 (×2): qty 1
  Filled 2022-03-22: qty 2
  Filled 2022-03-22 (×7): qty 1
  Filled 2022-03-22: qty 2

## 2022-03-22 MED ORDER — FLUOXETINE HCL 20 MG PO CAPS
40.0000 mg | ORAL_CAPSULE | Freq: Every morning | ORAL | Status: DC
  Administered 2022-03-23 – 2022-04-12 (×21): 40 mg via ORAL
  Filled 2022-03-22 (×21): qty 2

## 2022-03-22 MED ORDER — LORAZEPAM 1 MG PO TABS
1.0000 mg | ORAL_TABLET | ORAL | Status: DC | PRN
  Administered 2022-03-23 – 2022-03-24 (×2): 2 mg via ORAL
  Filled 2022-03-22 (×2): qty 2
  Filled 2022-03-22: qty 1
  Filled 2022-03-22: qty 2

## 2022-03-22 MED ORDER — ACETAMINOPHEN 650 MG RE SUPP: 650.0000 mg | Freq: Four times a day (QID) | RECTAL | Status: DC | PRN

## 2022-03-22 MED ORDER — TRIAMCINOLONE ACETONIDE 0.025 % EX CREA
TOPICAL_CREAM | Freq: Two times a day (BID) | CUTANEOUS | Status: DC
  Filled 2022-03-22 (×7): qty 15

## 2022-03-22 MED ORDER — ACETAMINOPHEN 325 MG PO TABS
650.0000 mg | ORAL_TABLET | Freq: Four times a day (QID) | ORAL | Status: DC | PRN
  Administered 2022-03-26 – 2022-04-12 (×7): 650 mg via ORAL
  Filled 2022-03-22 (×7): qty 2

## 2022-03-22 MED ORDER — KETOCONAZOLE 2 % EX SHAM
MEDICATED_SHAMPOO | CUTANEOUS | Status: DC
  Filled 2022-03-22 (×2): qty 120

## 2022-03-22 MED ORDER — THIAMINE HCL 100 MG/ML IJ SOLN
100.0000 mg | Freq: Every day | INTRAMUSCULAR | Status: DC
  Filled 2022-03-22: qty 2

## 2022-03-22 MED ORDER — LACTATED RINGERS IV BOLUS
1000.0000 mL | Freq: Once | INTRAVENOUS | Status: AC
  Administered 2022-03-22: 1000 mL via INTRAVENOUS

## 2022-03-22 MED ORDER — ENOXAPARIN SODIUM 40 MG/0.4ML IJ SOSY
40.0000 mg | PREFILLED_SYRINGE | INTRAMUSCULAR | Status: DC
  Administered 2022-03-22 – 2022-04-11 (×21): 40 mg via SUBCUTANEOUS
  Filled 2022-03-22 (×21): qty 0.4

## 2022-03-22 MED ORDER — OXYCODONE HCL 5 MG PO TABS
5.0000 mg | ORAL_TABLET | ORAL | Status: DC | PRN
  Administered 2022-03-28 – 2022-03-30 (×3): 5 mg via ORAL
  Filled 2022-03-22 (×3): qty 1

## 2022-03-22 MED ORDER — SODIUM CHLORIDE 0.9 % IV SOLN: INTRAVENOUS | Status: DC

## 2022-03-22 MED ORDER — SODIUM CHLORIDE 0.9% FLUSH
3.0000 mL | Freq: Two times a day (BID) | INTRAVENOUS | Status: DC
  Administered 2022-03-22 – 2022-04-12 (×27): 3 mL via INTRAVENOUS

## 2022-03-22 MED ORDER — FOLIC ACID 1 MG PO TABS
1.0000 mg | ORAL_TABLET | Freq: Every day | ORAL | Status: DC
  Administered 2022-03-22 – 2022-04-12 (×22): 1 mg via ORAL
  Filled 2022-03-22 (×22): qty 1

## 2022-03-22 NOTE — Assessment & Plan Note (Addendum)
-   see cerebral palsy - Continue Keppra - Keppra level 13 (LLN)

## 2022-03-22 NOTE — Hospital Course (Addendum)
Ashley Bradley is a 71 yo female with PMH cerebral palsy with seizure disorder, alcohol abuse history with alcohol withdrawal seizure, depression, HTN, HLD, prolonged QTc, history of C. difficile who presented to the ER with wanting to detox from alcohol. Her son passed away the end of 12-11-22 and she states she has been in severe depression and has been in bed since then with continuous drinking daily.  She states that she drinks approximately 10-12 Coors light per day.  She orders beer via delivery.  Her husband is sober and does not drink in the house. She also has not showered since 2022/12/11.  She states that she has stayed in bed since then and wears diapers which her husband changes. She has been compliant on her Prozac and Keppra.  She states it has been about 2 to 3 years since her last seizure which was associated with alcohol withdrawal at that time.  She has a diffuse rash which is worse on her back.  The rash also involves her chest, arms, and legs.  She has no lesions in her mouth.  She does have a tooth which she states chipped recently and she feels may have possible infection. The rash was discussed with [facility] dermatology however without adequate examination in person, they were unable to provide significant recommendations.  They however did not recommend need for urgent transfer.  Patient admitted for support for alcohol withdrawal and ongoing monitoring of rash along with consultation with psychiatry due to severe depression.  She endorsed no suicidal ideation.

## 2022-03-22 NOTE — Assessment & Plan Note (Addendum)
-   Hold losartan in setting of soft blood pressure

## 2022-03-22 NOTE — Assessment & Plan Note (Addendum)
-   Last drink 6 AM on 03/22/2022 -Last withdrawal seizure approximately 2 to 3 years ago she reports.  She is on chronic Keppra possibly also in setting of history of cerebral palsy - etoh level negative; NH3 normal (20) -Minimal alcohol withdrawal symptoms during hospitalization - Continue folate, multivitamin, thiamine

## 2022-03-22 NOTE — ED Triage Notes (Addendum)
Per EMS, patient from home, reports ETOH binge x6 weeks. Hx alcohol abuse. C/o N/V/D x6 weeks. Reports rash to body x2 weeks. Decreased PO intake per patient. A&Ox4. Last drink this morning. Tremors with EMS.  BP 138/50 HR 130 97% RA CBG 135

## 2022-03-22 NOTE — Assessment & Plan Note (Signed)
-   left mandibular premolar tooth noted with chipping and poor dentition in general - no systemic signs of infection and no purulent drainage/bad taste in mouth - hold off on abx for now, but can initiate if develops further signs/symptoms - needs outpatient followup with dentist for extractions

## 2022-03-22 NOTE — Progress Notes (Signed)
Transition of Care [facility]) - Emergency Department Mini Assessment   Patient Details  Name: Ashley Bradley MRN: [id number] Date of Birth: Jun 09, 1951  [facility] Care [facility]) CM/SW Contact:    [name], LCSW Phone Number: 03/22/2022, 11:33 AM   Clinical Narrative: The pt presented to the ED for alcohol use symptoms. TOC was consulted for Substance Abuse resources. This CSW went to speak with the pt. The pt declined receiving Substance Abuse resources and stated "I know them all but thank you for offering anyway." TOC will not be attaching Substance Abuse resources per the pt's request.    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/2020   H/O febrile seizure 04/15/2020   DTs (delirium tremens) ([place]) 01/24/2020   Alcohol withdrawal seizure ([place]) 01/24/2020   High anion gap metabolic acidosis 56/97/9480   Melena 06/29/2016   Syncope 01/26/2016   Prolonged Q-T interval on ECG 01/26/2016   History of Clostridium difficile colitis 01/26/2016   Elevated troponin    Alcohol use disorder, moderate, in early remission, dependence ([place]) 01/21/2016   AKI (acute kidney injury) ([place]) 01/16/2016   Seizure disorder ([place]) 09/24/2013   Hyponatremia 09/20/2013   Major depression 03/19/2013   Alcohol intoxication in relapsed alcoholic ([place]) 16/55/3748   PCP:  Kristen Loader, FNP Pharmacy:   [facility], [place] [address] Phone: [phone] Fax: [phone]

## 2022-03-22 NOTE — ED Provider Notes (Addendum)
[facility] DEPT Provider Note   CSN: [id number] Arrival date & time: 03/22/22  2694     History  Chief Complaint  Patient presents with   Alcohol Problem    Ashley Bradley is a 71 y.o. female.   Alcohol Problem     71 year old female with medical history significant for cerebral palsy with seizure disorder, alcohol abuse and alcoholism history of delirium tremens and alcohol withdrawal seizures, prolonged QTc, history of C. difficile colitis, who presents to the emergency department with concern for alcohol withdrawal, purpuric rash that has been along her chest and abdomen for the past 2 weeks.  The patient denies any abdominal pain, chest pain, cough, fever or chills.  She states that she has had a purpuric rash that is been along her bilateral upper and lower extremities and along her chest and abdomen.  She denies any involvement of her mucous membranes.  She denies involvement of her palms or soles of her feet.  States that she has been on an alcohol binge for the last 6 weeks and tried to stop and subsequently "developed the shakes."  She endorses a chief complaint of nausea, vomiting, diarrhea for the past 6 weeks.  She states that she is unable to stop drinking.  She endorses decreased oral intake.  Her last consumption of alcohol was this morning.  She had tremors noted with EMS.  EMS found the patient and brought her to the emergency department vitals with EMS significant for heart rate 130s, mild tremor noted, CBG 135, BP 138/50, saturations 97% on room air.  Home Medications Prior to Admission medications   Medication Sig Start Date End Date Taking? Authorizing Provider  aspirin EC 81 MG tablet Take 81 mg by mouth daily.    [provider]  atorvastatin (LIPITOR) 10 MG tablet Take 10 mg by mouth daily. 04/05/21   [provider]  atorvastatin (LIPITOR) 20 MG tablet Take 20 mg by mouth daily. 12/07/21   [provider]   BIOTIN PO Take 5,000 mcg by mouth daily.    [provider]  CALCIUM PO Take 500 mg by mouth daily.    [provider]  feeding supplement (ENSURE ENLIVE / ENSURE PLUS) LIQD Take 237 mLs by mouth 3 (three) times daily between meals. 05/19/21   Raiford Noble Latif, DO  FLUoxetine (PROZAC) 40 MG capsule Take 40 mg by mouth every morning. 10/26/19   [provider]  folic acid (FOLVITE) 1 MG tablet Take 1 tablet (1 mg total) by mouth daily. 05/20/21   Sheikh, Omair Latif, DO  levETIRAcetam (KEPPRA) 500 MG tablet Take 1 tablet (500 mg total) by mouth 2 (two) times daily. 04/15/20   Penumalli, Earlean Polka, MD  losartan (COZAAR) 100 MG tablet Take 50 mg by mouth daily.    [provider]  losartan (COZAAR) 50 MG tablet Take 50 mg by mouth daily. 12/10/21   [provider]  Multiple Vitamin (MULTIVITAMIN WITH MINERALS) TABS tablet Take 1 tablet by mouth daily. 01/23/16   Bonnell Public, MD  saccharomyces boulardii (FLORASTOR) 250 MG capsule Take 250 mg by mouth 2 (two) times daily.    [provider]  thiamine 100 MG tablet Take 1 tablet (100 mg total) by mouth daily. 05/20/21   Raiford Noble Latif, DO  VITAMIN D PO Take 2,000 Units by mouth daily.    [provider]      Allergies    Ancef [cefazolin], Pantopaque [iophendylate], Macrodantin [  nitrofurantoin], and Morphine and related    Review of Systems   Review of Systems  Gastrointestinal:  Positive for diarrhea, nausea and vomiting.  Skin:  Positive for rash.  Neurological:  Positive for tremors.  All other systems reviewed and are negative.   Physical Exam Updated Vital Signs BP (!) 117/58   Pulse (!) 116   Temp (!) 97.5 F (36.4 C) (Oral)   Resp (!) 26   SpO2 95%  Physical Exam Vitals and nursing note reviewed.  Constitutional:      General: She is not in acute distress.    Appearance: She is well-developed.  HENT:     Head: Normocephalic and atraumatic.  Eyes:      Conjunctiva/sclera: Conjunctivae normal.  Cardiovascular:     Rate and Rhythm: Regular rhythm. Tachycardia present.  Pulmonary:     Effort: Pulmonary effort is normal. No respiratory distress.     Breath sounds: Normal breath sounds.  Abdominal:     Palpations: Abdomen is soft.     Tenderness: There is no abdominal tenderness.  Musculoskeletal:        General: No swelling.     Cervical back: Neck supple.  Skin:    General: Skin is warm and dry.     Capillary Refill: Capillary refill takes less than 2 seconds.     Findings: Rash present.     Comments: Palpable Purpura present throughout the patient's torso and abdomen, along all 4 extremities, spares the palms and soles, spares mucous membranes.  Diffuse erythrodermic rash covering the patient's back with some scaling present, no desquamation.  Neurological:     Mental Status: She is alert.     Comments: No cranial nerve deficit.  Moving all 4 extremities spontaneously and intact sensation to light touch, baseline tremor noted.  No asterixis  Psychiatric:        Mood and Affect: Mood normal.            ED Results / Procedures / Treatments   Labs (all labs ordered are listed, but only abnormal results are displayed) Labs Reviewed  COMPREHENSIVE METABOLIC PANEL - Abnormal; Notable for the following components:      Result Value   Sodium 134 (*)    Glucose, Bld 125 (*)    BUN <5 (*)    Creatinine, Ser 0.43 (*)    AST 74 (*)    ALT 60 (*)    Alkaline Phosphatase 202 (*)    Total Bilirubin 1.8 (*)    All other components within normal limits  VITAMIN B12 - Abnormal; Notable for the following components:   Vitamin B-12 1,133 (*)    All other components within normal limits  BILIRUBIN, DIRECT - Abnormal; Notable for the following components:   Bilirubin, Direct 0.4 (*)    All other components within normal limits  C DIFFICILE QUICK SCREEN W PCR REFLEX    GASTROINTESTINAL PANEL BY PCR, STOOL (REPLACES STOOL CULTURE)   MAGNESIUM  CBC  PROTIME-INR  FOLATE  ETHANOL  FIBRINOGEN  LACTATE DEHYDROGENASE  RETICULOCYTES  AMMONIA  LIPASE, BLOOD  HAPTOGLOBIN  URINALYSIS, ROUTINE W REFLEX MICROSCOPIC  CBG MONITORING, ED    EKG None  Radiology US Abdomen Limited RUQ (LIVER/GB)  Result Date: 03/22/2022 CLINICAL DATA:  Elevated liver function tests EXAM: ULTRASOUND ABDOMEN LIMITED RIGHT UPPER QUADRANT COMPARISON:  None Available. FINDINGS: Gallbladder: No gallstones or wall thickening visualized. No sonographic Murphy sign noted by sonographer. Common bile duct: Diameter: 0.2 cm Liver: No focal lesion  identified. Coarse echotexture with accentuated echogenicity. Portal vein is patent on color Doppler imaging with normal direction of blood flow towards the liver. Other: None. IMPRESSION: Coarse echogenic liver with poor sonic penetration compatible with diffuse hepatic steatosis. Electronically Signed   By: Van Clines M.D.   On: 03/22/2022 16:34    Procedures Procedures    Medications Ordered in ED Medications  LORazepam (ATIVAN) tablet 1-4 mg ( Oral See Alternative 03/22/22 1204)    Or  LORazepam (ATIVAN) injection 1-4 mg (2 mg Intravenous Given 03/22/22 1204)  thiamine (VITAMIN B1) tablet 100 mg (100 mg Oral Given 03/22/22 1204)    Or  thiamine (VITAMIN B1) injection 100 mg ( Intravenous See Alternative 09/25/38 1027)  folic acid (FOLVITE) tablet 1 mg (1 mg Oral Given 03/22/22 1204)  multivitamin with minerals tablet 1 tablet (1 tablet Oral Given 03/22/22 1204)  lactated ringers bolus 1,000 mL (0 mLs Intravenous Stopped 03/22/22 1702)    ED Course/ Medical Decision Making/ A&P                           Medical Decision Making Amount and/or Complexity of Data Reviewed Labs: ordered. Radiology: ordered.  Risk OTC drugs. Prescription drug management. Decision regarding hospitalization.    71 year old female with medical history significant for cerebral palsy with seizure disorder, alcohol  abuse and alcoholism history of delirium tremens and alcohol withdrawal seizures, prolonged QTc, history of C. difficile colitis, who presents to the emergency department with concern for alcohol withdrawal, purpuric rash that has been along her chest and abdomen for the past 2 weeks.  The patient denies any abdominal pain, chest pain, cough, fever or chills.  She states that she has had a purpuric rash that is been along her bilateral upper and lower extremities and along her chest and abdomen.  She denies any involvement of her mucous membranes.  She denies involvement of her palms or soles of her feet.  States that she has been on an alcohol binge for the last 6 weeks and tried to stop and subsequently "developed the shakes."  She endorses a chief complaint of nausea, vomiting, diarrhea for the past 6 weeks.  She states that she is unable to stop drinking.  She endorses decreased oral intake.  Her last consumption of alcohol was this morning.  She had tremors noted with EMS.  EMS found the patient and brought her to the emergency department vitals with EMS significant for heart rate 130s, mild tremor noted, CBG 135, BP 138/50, saturations 97% on room air.  On arrival, the patient was afebrile, tachycardic P1 31, hemodynamically stable BP 127/72, saturating well on room air.  Sinus tachycardia noted on cardiac telemetry.  Physical exam significant for purpura noted across the patient's extremities and abdomen and chest.  Favor likely refer associated with chronic liver disease in the setting of chronic alcohol abuse.  Additionally considered TTP.  Will obtain additional screening labs to evaluate.  Finally considered undiagnosed psoriasis with diffuse erythrodermic psoriasis as the etiology of the patient's rash.  Patient could benefit from outpatient dermatology evaluation and biopsy.  Low concern for TPN or SJS.  Low concern for erythema multiforme.  Patient not on any recent medications, lower concern for  drug reaction.  Laboratory evaluation significant for magnesium normal, reticulocytes normal, CBC without leukocytosis or anemia, notably platelets 252.  Low concern for ITP or TTP at this time with no anemia or thrombocytopenia.  CMP with no  evidence of AKI, blood glucose 125, no significant electrolyte abnormality, elevated LFTs with an AST of 74, ALT of 60, alkaline phosphatase of 202, T. bili elevated at 1.8.  LDH was normal, serum folate level was normal, ferritin was normal, PT and INR was also normal.  B12 is mildly elevated at 1133.  Given the patient's diarrheal illness, C. difficile PCR and GIP was ordered.  Her abdomen on exam was soft, nontender, nondistended with no fluid wave, low concern for SBP.  The patient's CIWA was found to be elevated at 13.  Plan for admission for IV fluid resuscitation and management of likely alcohol withdrawal symptoms.  Hospitalist medicine consulted for admission.   Plan at time of signout to engage the hospitalist service for likely admission.  Signout given to Dr. Francia Greaves at 1600.   Final Clinical Impression(s) / ED Diagnoses Final diagnoses:  Purpura ([place])  Alcohol withdrawal syndrome without complication (HCC)  Alcoholic cirrhosis, unspecified whether ascites present [facility])    Rx / DC Orders ED Discharge Orders     None         Regan Lemming, MD 03/22/22 1708    Regan Lemming, MD 03/22/22 1709

## 2022-03-22 NOTE — Assessment & Plan Note (Addendum)
-   bed bound since end of June - SNF recommended -Patient encouraged to continue working with PT/OT

## 2022-03-22 NOTE — Assessment & Plan Note (Signed)
-   Right lower extremity noted with increased tone and decreased flexibility - Continue Keppra

## 2022-03-22 NOTE — Assessment & Plan Note (Addendum)
-   Likely alcohol related

## 2022-03-22 NOTE — H&P (Signed)
History and Physical    Ashley Bradley  TMA:[id number]  DOB: 01-Jul-1951  DOA: 03/22/2022  PCP: Kristen Loader, FNP Patient coming from: Home  Chief Complaint: Wants to detox from etoh   HPI:  Ms. Ladouceur is a 71 yo female with PMH cerebral palsy with seizure disorder, alcohol abuse history with alcohol withdrawal seizure, depression, HTN, HLD, prolonged QTc, history of C. difficile who presented to the ER with wanting to detox from alcohol. Her son passed away the end of 01/09/2023 and she states she has been in severe depression and has been in bed since then with continuous drinking daily.  She states that she drinks approximately 10-12 [employer] light per day.  She orders beer via delivery.  Her husband is sober and does not drink in the house. She also has not showered since 01-09-23.  She states that she has stayed in bed since then and wears diapers which her husband changes. She has been compliant on her Prozac and Keppra.  She states it has been about 2 to 3 years since her last seizure which was associated with alcohol withdrawal at that time.  She has a diffuse rash which is worse on her back.  The rash also involves her chest, arms, and legs.  She has no lesions in her mouth.  She does have a tooth which she states chipped recently and she feels may have possible infection. The rash was discussed with [facility] dermatology however without adequate examination in person, they were unable to provide significant recommendations.  They however did not recommend need for urgent transfer.  Patient also did not wish to transfer.  Bed availability of course also unavailable beds at [facility].  Vitals in the ER included tachycardia, HR 131.  Remainder of vitals: 98.7, RR 18, BP 127/72 (mild downtrend to 105/72), 96% RA.  WBC 7.9, Hgb 14.2, PLTC 252. Na 134, K 3.7, BUN < 5, creat 0.43, AST 74, ALT 60, Alk phos 202, TB 1.8 Folate 18.7, B12 1133.   Patient admitted for support for alcohol withdrawal and  ongoing monitoring of rash along with probable consultation with psychiatry due to severe depression.  She endorsed no suicidal ideation.  I have personally briefly reviewed patient's old medical records in [facility] and discussed patient with the ER provider when appropriate/indicated.  Assessment and Plan: Alcohol withdrawal ([place]) - Last drink 6 AM on 03/22/2022 -Last withdrawal seizure approximately 2 to 3 years ago she reports.  She is on chronic Keppra possibly also in setting of history of cerebral palsy - etoh level negative; NH3 normal (20) -Continue on CIWA protocol - Continue folate, multivitamin, thiamine - Continue diet - Continue fluids  Dermatitis - patient states no history of rash in general prior to being bed ridden; denies history of psoriasis or skin conditions in general. She has not showered or been out of bed since end of 2023-01-09 and wearing diapers for voiding and have BMs - ER did discuss with [facility] derm (Dr. Denese Killings) but no pics able to be reviewed; their initial rec was to consider triamcinolone or silvadene cream - much of her rash looks like seborrheic and/or irritant contact dermatitis especially since it's worse on her back which if she's been laying in bed for 3 months would seem expected; scalp also has seborrheic appearance and very neglected hair - ketoconazole shampoo every other day to hair/scalp - bathe patient DAILY - triamcinolone 0.025% (low potency) cream and ketoconazole cream to body affected with  rash (chest, back, arms, legs, etc)  Major depression - Compliant with Prozac she states -Denies suicidal ideation but patient grossly depressed since her son's death the end of 2023-01-23.  She has been laying in bed ordering delivery of beer and has not showered since 01/23/2023 either -Continue Prozac - Psychiatry consult once withdrawals are near complete  Diarrhea - Likely alcohol related but C. 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[place]) - Right lower extremity noted with increased tone and decreased flexibility - Continue Keppra  Seizure disorder ([place]) - see cerebral palsy - Continue Keppra - Check Keppra level    Code Status: Full   DVT Prophylaxis: Lovenox     Anticipated disposition is to: pending PT eval  History: Past Medical History:  Diagnosis Date   Alcoholism ([place])    Complication of anesthesia    hard time waking up   CP (cerebral palsy) ([place])    Depression    Hypertension    PONV (postoperative nausea and vomiting)    Seizures ([place])    ETOH induced    Past Surgical History:  Procedure Laterality Date   BACK SURGERY     x 3   BLADDER SURGERY     BREAST ENHANCEMENT SURGERY     ESOPHAGOGASTRODUODENOSCOPY (EGD) WITH PROPOFOL N/A 10/21/2017   Procedure: ESOPHAGOGASTRODUODENOSCOPY (EGD) WITH PROPOFOL;  Surgeon: Clarene Essex, MD;  Location: [facility] ENDOSCOPY;  Service: Endoscopy;  Laterality: N/A;   TEE WITHOUT CARDIOVERSION N/A 10/24/2017   Procedure: TRANSESOPHAGEAL ECHOCARDIOGRAM (TEE);  Surgeon: Herminio Commons, MD;  Location: [facility] ENDOSCOPY;  Service: Cardiovascular;  Laterality: N/A;   TUBAL LIGATION       reports that she has never smoked. She has never used smokeless tobacco. She reports current alcohol use. She reports that she does not use drugs.  Allergies  Allergen Reactions   Ancef [Cefazolin] Other (See Comments)    Skin blisters, peeling.    Pantopaque [Iophendylate] Other (See Comments)    Aseptic meningitis.   Macrodantin [Nitrofurantoin] Nausea And Vomiting   Morphine And Related Nausea And Vomiting    Per patient, only has reaction when given IV. Okay to take PO.    Family History  Problem Relation Age of Onset   CAD Mother 54   Heart attack Mother    Lung cancer Father 42   Bone cancer Maternal Grandmother    Home Medications: Prior to Admission medications   Medication Sig Start Date End Date Taking? Authorizing Provider  aspirin EC 81 MG tablet Take 81 mg by mouth daily.    [provider]  atorvastatin (LIPITOR) 10 MG tablet Take 10 mg by mouth daily. 04/05/21   [provider]  atorvastatin (LIPITOR) 20 MG tablet Take 20 mg by mouth daily. 12/07/21   [provider]  BIOTIN PO Take 5,000 mcg by mouth daily.    [provider]  CALCIUM PO Take 500 mg by mouth daily.    [provider]  feeding supplement (ENSURE ENLIVE / ENSURE PLUS) LIQD Take 237 mLs by mouth 3 (three) times daily between meals. 05/19/21   Raiford Noble Latif, DO  FLUoxetine (PROZAC) 40 MG capsule Take 40 mg by mouth every morning. 10/26/19   [provider]  folic acid (FOLVITE) 1 MG tablet Take 1 tablet (1 mg total) by mouth daily. 05/20/21   Sheikh, Omair Latif, DO  levETIRAcetam (KEPPRA) 500 MG tablet Take 1 tablet (500 mg total) by mouth 2 (two) times daily. 04/15/20   Penumalli, Earlean Polka, MD  losartan (COZAAR) 100 MG tablet Take 50 mg by mouth daily.    [provider]  losartan (COZAAR) 50 MG tablet Take 50 mg by mouth daily. 12/10/21   [provider]  Multiple Vitamin (MULTIVITAMIN WITH MINERALS) TABS tablet Take 1 tablet by mouth daily. 01/23/16   Bonnell Public, MD  saccharomyces boulardii (FLORASTOR) 250 MG capsule Take 250 mg by mouth 2 (two) times daily.    [provider]  thiamine 100 MG tablet Take 1 tablet (100 mg total) by mouth daily. 05/20/21    Raiford Noble Latif, DO  VITAMIN D PO Take 2,000 Units by mouth daily.    [provider]    Review of Systems:  Review of Systems  Constitutional: Negative.   HENT: Negative.    Eyes: Negative.   Respiratory: Negative.    Cardiovascular: Negative.   Gastrointestinal:  Positive for diarrhea.  Genitourinary: Negative.   Musculoskeletal: Negative.   Skin:  Positive for itching and rash.  Neurological:  Positive for weakness.  Endo/Heme/Allergies: Negative.   Psychiatric/Behavioral:  Positive for depression. Negative for suicidal ideas.     Physical Exam:  Vitals:   03/22/22 1500 03/22/22 1600 03/22/22 1630 03/22/22 1730  BP: 112/62 (!) 117/58 109/65 105/72  Pulse: (!) 111 (!) 116 (!) 108 (!) 102  Resp: 12 (!) 26 20 (!) 23  Temp:    97.8 F (36.6 C)  TempSrc:      SpO2: 95% 95% 96% 92%   Physical Exam Constitutional:      Comments: Awake, alert, and cooperative resting in bed in no distress  HENT:     Head: Normocephalic and atraumatic.     Comments: Scalp with dandruff appreciated    Mouth/Throat:     Mouth: Mucous membranes are moist.     Comments: Chipped tooth noted left mandibular premolar area; no mucosal lesions appreciated Eyes:     Extraocular Movements: Extraocular movements intact.  Cardiovascular:     Rate and Rhythm: Normal rate and regular rhythm.  Pulmonary:     Effort: Pulmonary effort is normal.     Breath sounds: Normal breath sounds.  Abdominal:     General: Bowel sounds are normal. There is no distension.     Palpations: Abdomen is soft.     Tenderness: There is no abdominal tenderness.  Musculoskeletal:        General: No swelling or tenderness.     Cervical back: Normal range of motion and neck supple.     Comments: Stiff right lower extremity, decreased ROM in right ankle chronic in setting of cerebral palsy history  Skin:    Comments: Diffuse rash throughout body appearing erythematous, scaly, patchy (worse in back, next worse in  chest). Then scattered lesions on arms and legs  Neurological:     General: No focal deficit present.     Mental Status: She is oriented to person, place, and time.  Psychiatric:  Comments: Depressed mood      Labs on Admission:  I have personally reviewed following labs and imaging studies Results for orders placed or performed during the hospital encounter of 03/22/22 (from the past 24 hour(s))  Protime-INR     Status: None   Collection Time: 03/22/22 11:42 AM  Result Value Ref Range   Prothrombin Time 14.2 11.4 - 15.2 seconds   INR 1.1 0.8 - 1.2  Vitamin B12     Status: Abnormal   Collection Time: 03/22/22 11:42 AM  Result Value Ref Range   Vitamin B-12 1,133 (H) 180 - 914 pg/mL  Folate, serum, performed at [facility] lab     Status: None   Collection Time: 03/22/22 11:42 AM  Result Value Ref Range   Folate 18.7 >5.9 ng/mL  Fibrinogen     Status: None   Collection Time: 03/22/22 11:42 AM  Result Value Ref Range   Fibrinogen 350 210 - 475 mg/dL  Lactate dehydrogenase     Status: None   Collection Time: 03/22/22 11:42 AM  Result Value Ref Range   LDH 125 98 - 192 U/L  Ammonia     Status: None   Collection Time: 03/22/22 11:47 AM  Result Value Ref Range   Ammonia 20 9 - 35 umol/L  Ethanol     Status: None   Collection Time: 03/22/22 12:00 PM  Result Value Ref Range   Alcohol, Ethyl (B) <10 <10 mg/dL  Comprehensive metabolic panel     Status: Abnormal   Collection Time: 03/22/22 12:02 PM  Result Value Ref Range   Sodium 134 (L) 135 - 145 mmol/L   Potassium 3.7 3.5 - 5.1 mmol/L   Chloride 99 98 - 111 mmol/L   CO2 24 22 - 32 mmol/L   Glucose, Bld 125 (H) 70 - 99 mg/dL   BUN <5 (L) 8 - 23 mg/dL   Creatinine, Ser 0.43 (L) 0.44 - 1.00 mg/dL   Calcium 9.9 8.9 - 10.3 mg/dL   Total Protein 7.3 6.5 - 8.1 g/dL   Albumin 3.6 3.5 - 5.0 g/dL   AST 74 (H) 15 - 41 U/L   ALT 60 (H) 0 - 44 U/L   Alkaline Phosphatase 202 (H) 38 - 126 U/L   Total Bilirubin 1.8 (H) 0.3 - 1.2  mg/dL   GFR, Estimated >60 >60 mL/min   Anion gap 11 5 - 15  Magnesium     Status: None   Collection Time: 03/22/22 12:02 PM  Result Value Ref Range   Magnesium 2.0 1.7 - 2.4 mg/dL  CBC     Status: None   Collection Time: 03/22/22 12:02 PM  Result Value Ref Range   WBC 7.9 4.0 - 10.5 K/uL   RBC 4.32 3.87 - 5.11 MIL/uL   Hemoglobin 14.2 12.0 - 15.0 g/dL   HCT 41.0 36.0 - 46.0 %   MCV 94.9 80.0 - 100.0 fL   MCH 32.9 26.0 - 34.0 pg   MCHC 34.6 30.0 - 36.0 g/dL   RDW 13.5 11.5 - 15.5 %   Platelets 252 150 - 400 K/uL   nRBC 0.0 0.0 - 0.2 %  Reticulocytes     Status: None   Collection Time: 03/22/22 12:32 PM  Result Value Ref Range   Retic Ct Pct 1.9 0.4 - 3.1 %   RBC. 4.25 3.87 - 5.11 MIL/uL   Retic Count, Absolute 81.6 19.0 - 186.0 K/uL   Immature Retic Fract 8.8 2.3 - 15.9 %  Bilirubin, direct     Status: Abnormal   Collection Time: 03/22/22  2:40 PM  Result Value Ref Range   Bilirubin, Direct 0.4 (H) 0.0 - 0.2 mg/dL  Lipase, blood     Status: None   Collection Time: 03/22/22  2:40 PM  Result Value Ref Range   Lipase 32 11 - 51 U/L     Radiological Exams on Admission: US Abdomen Limited RUQ (LIVER/GB)  Result Date: 03/22/2022 CLINICAL DATA:  Elevated liver function tests EXAM: ULTRASOUND ABDOMEN LIMITED RIGHT UPPER QUADRANT COMPARISON:  None Available. FINDINGS: Gallbladder: No gallstones or wall thickening visualized. No sonographic Murphy sign noted by sonographer. Common bile duct: Diameter: 0.2 cm Liver: No focal lesion identified. Coarse echotexture with accentuated echogenicity. Portal vein is patent on color Doppler imaging with normal direction of blood flow towards the liver. Other: None. IMPRESSION: Coarse echogenic liver with poor sonic penetration compatible with diffuse hepatic steatosis. Electronically Signed   By: Van Clines M.D.   On: 03/22/2022 16:34    US Abdomen Limited RUQ (LIVER/GB)  Final Result      Consults called:     EKG: pending     Dwyane Dee, MD [facility] 03/22/2022, 6:49 PM

## 2022-03-22 NOTE — ED Notes (Signed)
Techs have been working to get urine sample and having no luck yet.

## 2022-03-22 NOTE — Assessment & Plan Note (Addendum)
-   Compliant with Prozac she states -Denies suicidal ideation but patient grossly depressed since her son's death the end of 01/05/23.  She has been laying in bed ordering delivery of beer and has not showered since 01-05-2023 either -Continue Prozac -patient declined psychiatry consult with plans for following up outpatient with her counselor

## 2022-03-22 NOTE — ED Notes (Signed)
ED TO INPATIENT HANDOFF REPORT  Name/Age/Gender Ashley Bradley 71 y.o. female  Code Status Code Status History     Date Active Date Inactive Code Status Order ID Comments User Context   05/12/2021 2040 05/20/2021 2059 Full Code [id number]  [place], [place] Inpatient   01/24/2020 0512 02/03/2020 2016 Full Code [id number]  Arnell Asal, NP ED   10/13/2017 0323 10/26/2017 1800 Full Code [id number]  Kandice Hams, MD ED   06/29/2016 [phone] 07/05/2016 1617 DNR [id number]  Karmen Bongo, MD ED   01/26/2016 1405 01/27/2016 1038 Full Code [id number]  Verlee Monte, MD Inpatient   01/16/2016 1407 01/23/2016 1925 Full Code [id number]  Florencia Reasons, MD Inpatient   09/20/2013 1639 09/24/2013 1646 Full Code [id number]  Thurnell Lose, MD Inpatient   03/15/2013 2000 03/19/2013 1637 Full Code [id number]  Delfin Gant, NP Inpatient   03/14/2013 1751 03/15/2013 2000 Full Code [id number]  Elisha Headland, NP ED       Home/SNF/Other Home  Chief Complaint Alcohol withdrawal ([place]) [F10.939]  Level of Care/Admitting Diagnosis ED Disposition     ED Disposition  Admit   Condition  --   Comment  Hospital Area: [facility] [[id number]]  Level of Care: Progressive [102]  Admit to Progressive based on following criteria: MULTISYSTEM THREATS such as stable sepsis, metabolic/electrolyte imbalance with or without encephalopathy that is responding to early treatment.  May admit patient to [facility] or [facility] if equivalent level of care is available:: No  Covid Evaluation: Asymptomatic - no recent exposure (last 10 days) testing not required  Diagnosis: Alcohol withdrawal ([place]) [291.81.ICD-9-CM]  Admitting Physician: Dwyane Dee [phone]  Attending Physician: Dwyane Dee [[id number]]  Certification:: I certify this patient will need inpatient services for at least 2 midnights  Estimated Length of Stay: 2          Medical History Past Medical History:  Diagnosis Date   Alcoholism  ([place])    Complication of anesthesia    hard time waking up   CP (cerebral palsy) ([place])    Depression    Hypertension    PONV (postoperative nausea and vomiting)    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   Pantopaque [Iophendylate] Other (See Comments)    Aseptic meningitis.   Macrodantin [Nitrofurantoin] Nausea And Vomiting   Morphine And Related Nausea And Vomiting    Per patient, only has reaction when given IV. Okay to take PO.    IV Location/Drains/Wounds Patient Lines/Drains/Airways Status     Active Line/Drains/Airways     Name Placement date Placement time Site Days   Peripheral IV 03/22/22 20 G Left Antecubital 03/22/22  1146  Antecubital  less than 1   External Urinary Catheter 03/22/22  1515  --  less than 1   Wound / Incision (Open or Dehisced) 10/21/17 10/21/17  --  --  1613            Labs/Imaging Results for orders placed or performed during the hospital encounter of 03/22/22 (from the past 48 hour(s))  Protime-INR     Status: None   Collection Time: 03/22/22 11:42 AM  Result Value Ref Range   Prothrombin Time 14.2 11.4 - 15.2 seconds   INR 1.1 0.8 - 1.2    Comment: (NOTE) INR goal varies based on device and disease states. Performed at [facility], [address], [place], [place] [address]   Vitamin B12     Status: Abnormal   Collection Time: 03/22/22 11:42 AM  Result Value Ref Range   Vitamin B-12 1,133 (H) 180 - 914 pg/mL    Comment: (NOTE) This assay is not validated for testing neonatal or myeloproliferative syndrome specimens for Vitamin B12 levels. Performed at [facility], [address] [place], [place] [address]   Folate, serum, performed at [facility] lab     Status: None   Collection Time: 03/22/22 11:42 AM  Result Value Ref Range   Folate 18.7 >5.9 ng/mL    Comment: Performed at  [facility], [address], [place], [place] [address]  Fibrinogen     Status: None   Collection Time: 03/22/22 11:42 AM  Result Value Ref Range   Fibrinogen 350 210 - 475 mg/dL    Comment: (NOTE) Fibrinogen results may be underestimated in patients receiving thrombolytic therapy. Performed at [facility], [address] [place], [place] [address]   Lactate dehydrogenase     Status: None   Collection Time: 03/22/22 11:42 AM  Result Value Ref Range   LDH 125 98 - 192 U/L    Comment: Performed at [facility], [address], [place], [place] [address]  Ammonia     Status: None   Collection Time: 03/22/22 11:47 AM  Result Value Ref Range   Ammonia 20 9 - 35 umol/L    Comment: Performed at [facility], [address] [place], [place] [address]  Ethanol     Status: None   Collection Time: 03/22/22 12:00 PM  Result Value Ref Range   Alcohol, Ethyl (B) <10 <10 mg/dL    Comment: (NOTE) Lowest detectable limit for serum alcohol is 10 mg/dL.  For medical purposes only. Performed at [facility], [address], [place], [place] [address]   Comprehensive metabolic panel     Status: Abnormal   Collection Time: 03/22/22 12:02 PM  Result Value Ref Range   Sodium 134 (L) 135 - 145 mmol/L   Potassium 3.7 3.5 - 5.1 mmol/L   Chloride 99 98 - 111 mmol/L   CO2 24 22 - 32 mmol/L   Glucose, Bld 125 (H) 70 - 99 mg/dL    Comment: Glucose reference range applies only to samples taken after fasting for at least 8 hours.   BUN <5 (L) 8 - 23 mg/dL   Creatinine, Ser 9.79 (L) 0.44 - 1.00 mg/dL   Calcium 9.9 8.9 - 48.0 mg/dL   Total Protein 7.3 6.5 - 8.1 g/dL   Albumin 3.6 3.5 - 5.0 g/dL   AST 74 (H) 15 - 41 U/L   ALT 60 (H) 0 - 44 U/L   Alkaline Phosphatase 202 (H) 38 - 126 U/L   Total Bilirubin 1.8 (H) 0.3 - 1.2 mg/dL   GFR, Estimated >16 >55 mL/min    Comment: (NOTE) Calculated using the  CKD-EPI Creatinine Equation (2021)    Anion gap 11 5 - 15    Comment: Performed at [facility], [address], [place], [place] [address]  Magnesium     Status: None   Collection Time: 03/22/22 12:02 PM  Result Value Ref Range   Magnesium 2.0 1.7 - 2.4 mg/dL    Comment: Performed at [facility], [address] [place], [place] [address]  CBC     Status: None  Collection Time: 03/22/22 12:02 PM  Result Value Ref Range   WBC 7.9 4.0 - 10.5 K/uL   RBC 4.32 3.87 - 5.11 MIL/uL   Hemoglobin 14.2 12.0 - 15.0 g/dL   HCT 82.941.0 56.236.0 - 13.046.0 %   MCV 94.9 80.0 - 100.0 fL   MCH 32.9 26.0 - 34.0 pg   MCHC 34.6 30.0 - 36.0 g/dL   RDW 86.513.5 78.411.5 - 69.615.5 %   Platelets 252 150 - 400 K/uL   nRBC 0.0 0.0 - 0.2 %    Comment: Performed at [facility], [address] [place], [place] [address]  Reticulocytes     Status: None   Collection Time: 03/22/22 12:32 PM  Result Value Ref Range   Retic Ct Pct 1.9 0.4 - 3.1 %   RBC. 4.25 3.87 - 5.11 MIL/uL   Retic Count, Absolute 81.6 19.0 - 186.0 K/uL   Immature Retic Fract 8.8 2.3 - 15.9 %    Comment: Performed at [facility], [address] [place], [place] [address]  Bilirubin, direct     Status: Abnormal   Collection Time: 03/22/22  2:40 PM  Result Value Ref Range   Bilirubin, Direct 0.4 (H) 0.0 - 0.2 mg/dL    Comment: Performed at [facility], [address] [place], [place] [address]  Lipase, blood     Status: None   Collection Time: 03/22/22  2:40 PM  Result Value Ref Range   Lipase 32 11 - 51 U/L    Comment: Performed at [facility], [address] [place], [place] [address]   US Abdomen Limited RUQ (LIVER/GB)  Result Date: 03/22/2022 CLINICAL DATA:  Elevated liver function tests EXAM: ULTRASOUND ABDOMEN LIMITED RIGHT UPPER QUADRANT COMPARISON:  None Available. FINDINGS: Gallbladder: No gallstones or wall thickening  visualized. No sonographic Murphy sign noted by sonographer. Common bile duct: Diameter: 0.2 cm Liver: No focal lesion identified. Coarse echotexture with accentuated echogenicity. Portal vein is patent on color Doppler imaging with normal direction of blood flow towards the liver. Other: None. IMPRESSION: Coarse echogenic liver with poor sonic penetration compatible with diffuse hepatic steatosis. Electronically Signed   By: Gaylyn RongWalter  Liebkemann M.D.   On: 03/22/2022 16:34    Pending Labs Unresulted Labs (From admission, onward)     Start     Ordered   03/22/22 1833  HIV Antibody (routine testing w rflx)  (HIV Antibody (Routine testing w reflex) panel)  Add-on,   AD        03/22/22 1832   03/22/22 1829  Levetiracetam level  Once,   R        03/22/22 1828   03/22/22 1145  C Difficile Quick Screen w PCR reflex  (C Difficile quick screen w PCR reflex panel )  Once, for 24 hours,   URGENT       References:    CDiff Information Tool   03/22/22 1144   03/22/22 1145  Gastrointestinal Panel by PCR , Stool  (Gastrointestinal Panel by PCR, Stool                                                                                                                                                     **  Does Not include CLOSTRIDIUM DIFFICILE testing. **If CDIFF testing is needed, place order from the "C Difficile Testing" order set.**)  Once,   URGENT        03/22/22 1144   03/22/22 1143  Haptoglobin  Once,   URGENT        03/22/22 1142   03/22/22 1143  Urinalysis, Routine w reflex microscopic  Once,   URGENT        03/22/22 1142   Signed and Held  TSH  Add-on,   R        Signed and Held   Signed and Held  CBC with Differential/Platelet  Daily at [facility],   R     Question:  Specimen collection method  Answer:  Lab=Lab collect   Signed and Held   Signed and Held  Comprehensive metabolic panel  Daily at [facility],   R     Question:  Specimen collection method  Answer:  Lab=Lab collect   Signed and Held   Signed and Held   Magnesium  Daily at [facility],   R     Question:  Specimen collection method  Answer:  Lab=Lab collect   Signed and Held   Signed and Held  Phosphorus  Daily at [facility],   R     Question:  Specimen collection method  Answer:  Lab=Lab collect   Signed and Held            Vitals/Pain Today's Vitals   03/22/22 1500 03/22/22 1600 03/22/22 1630 03/22/22 1730  BP: 112/62 (!) 117/58 109/65 105/72  Pulse: (!) 111 (!) 116 (!) 108 (!) 102  Resp: 12 (!) 26 20 (!) 23  Temp:    97.8 F (36.6 C)  TempSrc:      SpO2: 95% 95% 96% 92%  PainSc:        Isolation Precautions Enteric precautions (UV disinfection)  Medications Medications  LORazepam (ATIVAN) tablet 1-4 mg ( Oral See Alternative 03/22/22 1801)    Or  LORazepam (ATIVAN) injection 1-4 mg (2 mg Intravenous Given 03/22/22 1801)  thiamine (VITAMIN B1) tablet 100 mg (100 mg Oral Given 03/22/22 1204)    Or  thiamine (VITAMIN B1) injection 100 mg ( Intravenous See Alternative 03/22/22 1204)  folic acid (FOLVITE) tablet 1 mg (1 mg Oral Given 03/22/22 1204)  multivitamin with minerals tablet 1 tablet (1 tablet Oral Given 03/22/22 1204)  lactated ringers bolus 1,000 mL (0 mLs Intravenous Stopped 03/22/22 1702)    Mobility non-ambulatory

## 2022-03-22 NOTE — Assessment & Plan Note (Addendum)
-   patient states no history of rash in general prior to being bed ridden; denies history of psoriasis or skin conditions in general. She has not showered or been out of bed since end of June and wearing diapers for voiding and have BMs - ER did discuss with [facility] derm (Dr. Denese Killings) but no pics able to be reviewed; their initial rec was to consider triamcinolone or silvadene cream - much of her rash looks like seborrheic and/or irritant contact dermatitis especially since it's worse on her back which if she's been laying in bed for 3 months would seem expected; scalp also has seborrheic appearance and very neglected hair - ketoconazole shampoo every other day to hair/scalp - bathe patient DAILY -Continue fluconazole, calamine lotion, ketoconazole shampoo

## 2022-03-22 NOTE — ED Provider Notes (Signed)
Seen after prior EDP.  Dr. Armandina Gemma discussed admission with hospitalist service prior to my arrival.  Hospitalist service asked for you of case with [facility] dermatology.  Case reviewed with Dr. Denese Killings (Derm) at [facility].  Without the benefit of an in person exam, dermatology had limited recommendations.  She did recommend good skin care with use of both triamcinolone cream alternating with Silvadene.  Dermatology did not feel that the patient required emergent transfer for evaluation of her skin condition.  Additionally, patient has capacity to make decisions.  Patient is not interested in transfer.  She would prefer to be admitted closer to her home here in [place].  [facility] is currently without beds to accept transfer.  Hospitalist service made aware of this development.  Patient does require admission for acute alcohol withdrawal, dehydration, and deconditioning.   Valarie Merino, MD 03/22/22 [phone]

## 2022-03-23 DIAGNOSIS — F10930 Alcohol use, unspecified with withdrawal, uncomplicated: Secondary | ICD-10-CM | POA: Diagnosis not present

## 2022-03-23 LAB — URINALYSIS, ROUTINE W REFLEX MICROSCOPIC
Bilirubin Urine: NEGATIVE
Glucose, UA: NEGATIVE mg/dL
Ketones, ur: NEGATIVE mg/dL
Nitrite: NEGATIVE
Protein, ur: NEGATIVE mg/dL
Specific Gravity, Urine: 1.014 (ref 1.005–1.030)
pH: 6 (ref 5.0–8.0)

## 2022-03-23 LAB — HAPTOGLOBIN: Haptoglobin: 260 mg/dL (ref 42–346)

## 2022-03-23 MED ORDER — DIAZEPAM 5 MG PO TABS
5.0000 mg | ORAL_TABLET | Freq: Two times a day (BID) | ORAL | Status: DC
  Administered 2022-03-23 – 2022-03-24 (×4): 5 mg via ORAL
  Filled 2022-03-23 (×4): qty 1

## 2022-03-23 NOTE — Progress Notes (Signed)
   03/23/22 0511  Assess: MEWS Score  Temp 99.3 F (37.4 C)  BP 118/66  MAP (mmHg) 82  Pulse Rate (!) 102  ECG Heart Rate (!) 101  Resp (!) 22  SpO2 100 %  O2 Device Room Air  Assess: MEWS Score  MEWS Temp 0  MEWS Systolic 0  MEWS Pulse 1  MEWS RR 1  MEWS LOC 0  MEWS Score 2  MEWS Score Color Yellow  Assess: if the MEWS score is Yellow or Red  Were vital signs taken at a resting state? Yes  Focused Assessment No change from prior assessment  Does the patient meet 2 or more of the SIRS criteria? Yes  Does the patient have a confirmed or suspected source of infection? No  MEWS guidelines implemented *See Row Information* Yes  Treat  MEWS Interventions Administered scheduled meds/treatments;Other (Comment) (CIWA score 11. Ativan given)  Pain Scale 0-10  Pain Score 0  Take Vital Signs  Increase Vital Sign Frequency  Yellow: Q 2hr X 2 then Q 4hr X 2, if remains yellow, continue Q 4hrs  Escalate  MEWS: Escalate Yellow: discuss with charge nurse/RN and consider discussing with provider and RRT  Notify: Charge Nurse/RN  Name of Charge Nurse/RN Notified Pam  Date Charge Nurse/RN Notified 03/23/22  Time Charge Nurse/RN Notified [id number]  Notify: Provider  Provider Name/Title Kyere  Date Provider Notified 03/23/22  Time Provider Notified [phone]  Method of Notification  (secure chat)  Notification Reason Other (Comment) (yellow mews)  Provider response No new orders  Date of Provider Response 03/23/22  Time of Provider Response 0518  Assess: SIRS CRITERIA  SIRS Temperature  0  SIRS Pulse 1  SIRS Respirations  1  SIRS WBC 1  SIRS Score Sum  3

## 2022-03-23 NOTE — Progress Notes (Signed)
PROGRESS NOTE  Ashley Bradley  DOB: [birth year]  PCP: Kristen Loader, FNP JSE:[id number]  DOA: 03/22/2022  LOS: 1 day  Hospital Day: 2  Brief narrative: Ashley Bradley is a 71 y.o. female with PMH significant for cerebral palsy with seizure disorder, alcohol abuse with alcohol withdrawal seizure, depression, HTN, HLD, prolonged QTc, history of C. Difficile 90-19, patient presented to the ER with wanting to detox from alcohol. Her son passed away the end of 26-Dec-2022 and she states she has been in severe depression and has been in bed since then with continuous drinking daily.  She states that she drinks approximately 10-12 [employer] light per day.  She orders beer via delivery.  Her husband is sober and does not drink in the house. She also has not showered since 26-Dec-2022.  She states that she has stayed in bed since then and wears diapers which her husband changes. She has been compliant on her Prozac and Keppra.  She states it has been about 2 to 3 years since her last seizure which was associated with alcohol withdrawal at that time.  In the ED, she was tachycardic to 130s CBC unremarkable.  BMP with potassium low at 3.7, normal renal function On exam, she was noted to have diffuse rash all over her body which is worse on her back. She has no lesions in her mouth.   The rash was discussed with [facility] dermatology however without adequate examination in person, they were unable to provide significant recommendations.  They did not recommend urgent transfer.  Patient also did not wish to transfer.  Bed availability of course also unavailable beds [facility].   Patient was admitted to [facility] for alcohol withdrawal and ongoing monitoring of rash as well as suspicion of severe depression.  She did not endorse any suicidal ideation.   Subjective: Patient was seen and examined this morning.  Pleasant elderly Caucasian female.  Propped up in bed.  Not in physical distress.  She has slight tremors and is  tachycardic. Family not at bedside Chart reviewed Remains tachycardic in low 100s, afebrile No labs today.  Assessment and plan: Chronic alcoholism  alcohol withdrawal  Patient apparently been drinking heavily since her son expired in 12-26-2022.  She presented to the ED wanting to be detoxed. Last drink in the morning of 03/22/2022 Currently on CIWA protocol with as needed Ativan.  Has tremors this morning.  I started her on scheduled Valium 5 mg twice daily to minimize the frequent need of IV Ativan. Continue folate, multivitamin, thiamine Continue IV fluid, regular diet  History of seizure disorder, cerebral palsy Patient reports that her last withdrawal seizure was about 2 to 3 years ago. Continue Keppra  Dermatitis Likely because of bedridden status.  Apparently, she has not showered or been out of bed since end of 2022/12/26 and wearing diapers for voiding and have BMs ER did discuss with [facility] derm (Dr. Denese Killings) but no pics able to be reviewed; their initial rec was to consider triamcinolone or silvadene cream. Much of her rash looks like seborrheic and/or irritant contact dermatitis especially since it's worse on her back which if she's been laying in bed for 3 months would seem expected; scalp also has seborrheic appearance and very neglected hair Started on ketoconazole shampoo every other day to hair/scalp Please bathe patient daily Continue triamcinolone 0.025% (low potency) cream and ketoconazole cream to body affected with rash (chest, back, arms, legs, etc)   Major depression Reports compliance to  Prozac.   Denies suicidal ideation but patient grossly depressed since her son's death the end of 01/07/2023.  She has been laying in bed ordering delivery of beer and has not showered since 2023/01/07 either. Continue Prozac Psychiatry consult called.   Diarrhea Likely alcohol related but C. difficile and GI panel ordered for thoroughness.    Physical deconditioning bed bound since end of  2023/01/07 will need PT/OT eval once withdrawal near complete    Essential hypertension Hold losartan in setting of soft blood pressure Resume as able   Spastic hemiplegic cerebral palsy  Right lower extremity noted with increased tone and decreased flexibility Continue Keppra  Goals of care   Code Status: Full Code    Mobility: PT eval probably tomorrow  Skin assessment: Generalized rashes noted.    Nutritional status:  Body mass index is 24.07 kg/m.          Diet:  Diet Order             Diet regular Room service appropriate? No; Fluid consistency: Thin  Diet effective now                   DVT prophylaxis:  enoxaparin (LOVENOX) injection 40 mg Start: 03/22/22 2215   Antimicrobials: Topical ketoconazole Fluid: NS at 75 mill per hour Consultants: Psychiatry Family Communication: None at bedside   Status is: Inpatient  Continue inhospital care because: Ongoing management for alcohol withdrawal Level of care: Progressive   Dispo: The patient is from: Home              Anticipated d/c is to: Pending clinical course              Patient currently is not medically stable to d/c.   Difficult to place patient No     Infusions:   sodium chloride 75 mL/hr at 03/23/22 0303    Scheduled Meds:  diazepam  5 mg Oral Q12H   enoxaparin (LOVENOX) injection  40 mg Subcutaneous Q24H   FLUoxetine  40 mg Oral q morning   folic acid  1 mg Oral Daily   ketoconazole   Topical Daily   ketoconazole   Topical QODAY   levETIRAcetam  500 mg Oral BID   multivitamin with minerals  1 tablet Oral Daily   sodium chloride flush  3 mL Intravenous Q12H   thiamine  100 mg Oral Daily   Or   thiamine  100 mg Intravenous Daily   triamcinolone   Topical BID    PRN meds: acetaminophen **OR** acetaminophen, LORazepam **OR** LORazepam, oxyCODONE   Antimicrobials: Anti-infectives (From admission, onward)    None       Objective: Vitals:   03/23/22 0958 03/23/22 1334  BP:  (!) 128/91 119/69  Pulse: (!) 108 (!) 113  Resp:  20  Temp:  98.9 F (37.2 C)  SpO2:  96%    Intake/Output Summary (Last 24 hours) at 03/23/2022 1405 Last data filed at 03/23/2022 0600 Gross per 24 hour  Intake 1373 ml  Output 100 ml  Net 1273 ml   Filed Weights   03/22/22 2118  Weight: 65.6 kg   Weight change:  Body mass index is 24.07 kg/m.   Physical Exam: General exam: Pleasant elderly Caucasian female.  Not in physical distress Skin: No rashes, lesions or ulcers. HEENT: Atraumatic, normocephalic, no obvious bleeding Lungs: Clear to auscultation bilaterally CVS: Sinus tachycardia, no murmur GI/Abd soft, nontender, nondistended, bowel sound present CNS: Alert, awake, oriented x3 Psychiatry:  Depressed dark Extremities: No pedal edema, no calf tenderness  Data Review: I have personally reviewed the laboratory data and studies available.  F/u labs ordered Unresulted Labs (From admission, onward)     Start     Ordered   03/22/22 1848  Levetiracetam level  Once,   R        03/22/22 1848   03/22/22 1145  C Difficile Quick Screen w PCR reflex  (C Difficile quick screen w PCR reflex panel )  Once, for 24 hours,   URGENT       References:    CDiff Information Tool   03/22/22 1144   03/22/22 1145  Gastrointestinal Panel by PCR , Stool  (Gastrointestinal Panel by PCR, Stool                                                                                                                                                     **Does Not include CLOSTRIDIUM DIFFICILE testing. **If CDIFF testing is needed, place order from the "C Difficile Testing" order set.**)  Once,   URGENT        03/22/22 1144   03/22/22 1143  Haptoglobin  Once,   URGENT        03/22/22 1142   Signed and Held  CBC with Differential/Platelet  Daily at 5am,   R     Question:  Specimen collection method  Answer:  Lab=Lab collect   Signed and Held   Signed and Held  Comprehensive metabolic panel  Daily at [facility],   R      Question:  Specimen collection method  Answer:  Lab=Lab collect   Signed and Held   Signed and Held  Magnesium  Daily at [facility],   R     Question:  Specimen collection method  Answer:  Lab=Lab collect   Signed and Held   Signed and Held  Phosphorus  Daily at [facility],   R     Question:  Specimen collection method  Answer:  Lab=Lab collect   Signed and Held            Signed, Lorin Glass, MD [facility] 03/23/2022

## 2022-03-24 DIAGNOSIS — F10930 Alcohol use, unspecified with withdrawal, uncomplicated: Secondary | ICD-10-CM | POA: Diagnosis not present

## 2022-03-24 LAB — CBC
HCT: 37 % (ref 36.0–46.0)
Hemoglobin: 12.4 g/dL (ref 12.0–15.0)
MCH: 32.7 pg (ref 26.0–34.0)
MCHC: 33.5 g/dL (ref 30.0–36.0)
MCV: 97.6 fL (ref 80.0–100.0)
Platelets: 190 10*3/uL (ref 150–400)
RBC: 3.79 MIL/uL — ABNORMAL LOW (ref 3.87–5.11)
RDW: 13.1 % (ref 11.5–15.5)
WBC: 6.3 10*3/uL (ref 4.0–10.5)
nRBC: 0 % (ref 0.0–0.2)

## 2022-03-24 LAB — BASIC METABOLIC PANEL
Anion gap: 9 (ref 5–15)
BUN: 7 mg/dL — ABNORMAL LOW (ref 8–23)
CO2: 24 mmol/L (ref 22–32)
Calcium: 8 mg/dL — ABNORMAL LOW (ref 8.9–10.3)
Chloride: 99 mmol/L (ref 98–111)
Creatinine, Ser: 0.34 mg/dL — ABNORMAL LOW (ref 0.44–1.00)
GFR, Estimated: >60 mL/min (ref >60)
Glucose, Bld: 91 mg/dL (ref 70–99)
Potassium: 2.8 mmol/L — ABNORMAL LOW (ref 3.5–5.1)
Sodium: 132 mmol/L — ABNORMAL LOW (ref 135–145)

## 2022-03-24 LAB — GLUCOSE, CAPILLARY: Glucose-Capillary: 117 mg/dL — ABNORMAL HIGH (ref 70–99)

## 2022-03-24 LAB — MAGNESIUM: Magnesium: 1.7 mg/dL (ref 1.7–2.4)

## 2022-03-24 LAB — LEVETIRACETAM LEVEL: Levetiracetam Lvl: 13 ug/mL (ref 10.0–40.0)

## 2022-03-24 MED ORDER — MAGNESIUM SULFATE 2 GM/50ML IV SOLN
2.0000 g | Freq: Once | INTRAVENOUS | Status: AC
  Administered 2022-03-24: 2 g via INTRAVENOUS
  Filled 2022-03-24: qty 50

## 2022-03-24 MED ORDER — POTASSIUM CHLORIDE CRYS ER 20 MEQ PO TBCR
40.0000 meq | EXTENDED_RELEASE_TABLET | ORAL | Status: AC
  Administered 2022-03-24 (×3): 40 meq via ORAL
  Filled 2022-03-24 (×3): qty 2

## 2022-03-24 NOTE — Progress Notes (Signed)
Per MD order, enteric precautions discontinued.  Angie Fava, RN

## 2022-03-24 NOTE — Progress Notes (Signed)
   03/24/22 0900  Assess: MEWS Score  Temp 99.5 F (37.5 C)  BP 122/74  MAP (mmHg) 87  Pulse Rate (!) 108  Resp (!) 26  Level of Consciousness Alert  SpO2 98 %  O2 Device Room Air  Assess: MEWS Score  MEWS Temp 0  MEWS Systolic 0  MEWS Pulse 1  MEWS RR 2  MEWS LOC 0  MEWS Score 3  MEWS Score Color Yellow  Assess: if the MEWS score is Yellow or Red  Were vital signs taken at a resting state? Yes  Focused Assessment No change from prior assessment  Does the patient meet 2 or more of the SIRS criteria? Yes  Does the patient have a confirmed or suspected source of infection? No  MEWS guidelines implemented *See Row Information* No, previously yellow, continue vital signs every 4 hours  Assess: SIRS CRITERIA  SIRS Temperature  0  SIRS Pulse 1  SIRS Respirations  1  SIRS WBC 1  SIRS Score Sum  3

## 2022-03-24 NOTE — Consult Note (Signed)
Brief Psychiatry Consult Note  Went to see pt at the request of Dr. Marlowe Aschoff for significant depression. Pt politely declined the consult. She stated that she has an oupt psychiatrist, outpt therapist, and had come to the hospital primarily to detox from alcohol which she relapsed on after the loss of her son. States she told "them" that she has an outpt psychiatry team. She denies any suicidal ideation and would pray, talk to her husband, or come to the hospital if this occurred.   - psychiatry will sign off. Generally in depression with self-neglect early and frequent involvement of PT is essential  - continue current andepressant regimen   - brief review of w/u (TSH, B12, CBC) revealed no organic cause for depression  We will sign off at this time. This has been communicated to the primary team. If issues arise in the future, don't hesitate to reconsult the Psychiatry Inpatient Consult Service. Please only consult for questions of safety and/or capacity unless pt consents to consult.   Brannon Levene A Early Ord

## 2022-03-24 NOTE — Progress Notes (Signed)
PROGRESS NOTE  [facility]  DOB: 10-28-1950  PCP: Kristen Loader, FNP QPY:[id number]  DOA: 03/22/2022  LOS: 2 days  Hospital Day: 3  Brief narrative: [facility] is a 71 y.o. female with PMH significant for cerebral palsy with seizure disorder, alcohol abuse with alcohol withdrawal seizure, depression, HTN, HLD, prolonged QTc, history of C. Difficile 90-19, patient presented to the ER with wanting to detox from alcohol. Her son passed away the end of 12/30/22 and she states she has been in severe depression and has been in bed since then with continuous drinking daily.  She states that she drinks approximately 10-12 Coors light per day.  She orders beer via delivery.  Her husband is sober and does not drink in the house. She also has not showered since Dec 30, 2022.  She states that she has stayed in bed since then and wears diapers which her husband changes. She has been compliant on her Prozac and Keppra.  She states it has been about 2 to 3 years since her last seizure which was associated with alcohol withdrawal at that time.  In the ED, she was tachycardic to 130s CBC unremarkable.  BMP with potassium low at 3.7, normal renal function On exam, she was noted to have diffuse rash all over her body which is worse on her back. She has no lesions in her mouth.   The rash was discussed with [facility] dermatology however without adequate examination in person, they were unable to provide significant recommendations.  They did not recommend urgent transfer.  Patient also did not wish to transfer.  Bed availability of course also unavailable beds at [facility].   Patient was admitted to [facility] for alcohol withdrawal and ongoing monitoring of rash as well as suspicion of severe depression.  She did not endorse any suicidal ideation.   Subjective: Patient was seen and examined this morning.   Lying on bed.  Not in distress.  Feels better than yesterday.  Mild alcohol-related tremors remain.  Getting IV  Ativan and oral Valium per CIWA protocol.   Pending psychiatry consultation.  Assessment and plan: Chronic alcoholism  alcohol withdrawal  Patient apparently been drinking heavily since her son expired in 2022/12/30.  She presented to the ED wanting to be detoxed. Last drink in the morning of 03/22/2022 Currently on CIWA protocol with scheduled Valium 5 mg twice daily as well as as needed Ativan.   Continue folate, multivitamin, thiamine Continue IV fluid, regular diet  History of seizure disorder, cerebral palsy Patient reports that her last withdrawal seizure was about 2 to 3 years ago. Continue Keppra  Dermatitis Likely because of bedridden status.  Apparently, she has not showered or been out of bed since end of 2022/12/30 and wearing diapers for voiding and have [facility] did discuss with [facility] derm (Dr. Denese Killings) but no pics able to be reviewed; their initial rec was to consider triamcinolone or silvadene cream. Much of her rash looks like seborrheic and/or irritant contact dermatitis especially since it's worse on her back which if she's been laying in bed for 3 months would seem expected; scalp also has seborrheic appearance and very neglected hair Started on ketoconazole shampoo every other day to hair/scalp Please bathe patient daily Continue triamcinolone 0.025% (low potency) cream and ketoconazole cream to body affected with rash (chest, back, arms, legs, etc)   Major depression Reports compliance to Prozac.   Denies suicidal ideation but patient grossly depressed since her son's death the end of  June.  She has been laying in bed ordering delivery of beer and has not showered since June either. Continue Prozac Psychiatry consult pending.  Sinus tachycardia Remains tachycardic in low 100s.  Probably because of dehydration alcohol withdrawal.  Physiological.  Monitor for now.   Essential hypertension Hold losartan in setting of soft blood pressure Resume as able  Diarrhea Likely  alcohol related but C. difficile and GI panel ordered for thoroughness. Seems to have improved proved since admission.    Physical deconditioning bed bound since end of June PT eval ordered   Spastic hemiplegic cerebral palsy  Right lower extremity noted with increased tone and decreased flexibility Continue Keppra  Goals of care   Code Status: Full Code    Mobility: PT eval ordered  Skin assessment: Generalized rashes noted.    Nutritional status:  Body mass index is 24.07 kg/m.          Diet:  Diet Order             Diet regular Room service appropriate? No; Fluid consistency: Thin  Diet effective now                   DVT prophylaxis:  enoxaparin (LOVENOX) injection 40 mg Start: 03/22/22 2215   Antimicrobials: Topical ketoconazole Fluid: NS at 75 mill per hour Consultants: Psychiatry Family Communication: None at bedside   Status is: Inpatient  Continue inhospital care because: Ongoing management for alcohol withdrawal Level of care: Progressive   Dispo: The patient is from: Home              Anticipated d/c is to: Pending clinical course              Patient currently is not medically stable to d/c.   Difficult to place patient No     Infusions:   sodium chloride 75 mL/hr at 03/23/22 2310    Scheduled Meds:  diazepam  5 mg Oral Q12H   enoxaparin (LOVENOX) injection  40 mg Subcutaneous Q24H   FLUoxetine  40 mg Oral q morning   folic acid  1 mg Oral Daily   ketoconazole   Topical Daily   ketoconazole   Topical QODAY   levETIRAcetam  500 mg Oral BID   multivitamin with minerals  1 tablet Oral Daily   potassium chloride  40 mEq Oral Q2H   sodium chloride flush  3 mL Intravenous Q12H   thiamine  100 mg Oral Daily   Or   thiamine  100 mg Intravenous Daily   triamcinolone   Topical BID    PRN meds: acetaminophen **OR** acetaminophen, LORazepam **OR** LORazepam, oxyCODONE   Antimicrobials: Anti-infectives (From admission, onward)     None       Objective: Vitals:   03/24/22 1204 03/24/22 1325  BP: (!) 108/55 (!) 105/56  Pulse: 93 100  Resp:  20  Temp:  99 F (37.2 C)  SpO2: 97% 99%    Intake/Output Summary (Last 24 hours) at 03/24/2022 1429 Last data filed at 03/24/2022 1418 Gross per 24 hour  Intake 1323 ml  Output 400 ml  Net 923 ml   Filed Weights   03/22/22 2118  Weight: 65.6 kg   Weight change:  Body mass index is 24.07 kg/m.   Physical Exam: General exam: Pleasant elderly Caucasian female.  Not in physical distress Skin: No rashes, lesions or ulcers. HEENT: Atraumatic, normocephalic, no obvious bleeding Lungs: Clear to auscultation bilaterally CVS: Sinus tachycardia, no murmur GI/Abd soft,  nontender, nondistended, bowel sound present CNS: Alert, awake, oriented x3 Psychiatry: Depressed look Extremities: No pedal edema, no calf tenderness  Data Review: I have personally reviewed the laboratory data and studies available.  F/u labs ordered Unresulted Labs (From admission, onward)     Start     Ordered   03/25/22 0500  CBC with Differential/Platelet  Daily at 5am,   R     Question:  Specimen collection method  Answer:  Lab=Lab collect   03/24/22 0745   03/25/22 0500  Comprehensive metabolic panel  Daily at 5am,   R     Question:  Specimen collection method  Answer:  Lab=Lab collect   03/24/22 0745   03/25/22 0500  Magnesium  Daily at 5am,   R     Question:  Specimen collection method  Answer:  Lab=Lab collect   03/24/22 0745   03/25/22 0500  Phosphorus  Daily at 5am,   R     Question:  Specimen collection method  Answer:  Lab=Lab collect   03/24/22 0745   03/22/22 1848  Levetiracetam level  Once,   R        03/22/22 1848   03/22/22 1145  Gastrointestinal Panel by PCR , Stool  (Gastrointestinal Panel by PCR, Stool                                                                                                                                                     **Does Not include  CLOSTRIDIUM DIFFICILE testing. **If CDIFF testing is needed, place order from the "C Difficile Testing" order set.**)  Once,   URGENT        03/22/22 1144            Signed, Lorin Glass, MD [facility] 03/24/2022

## 2022-03-25 DIAGNOSIS — F10930 Alcohol use, unspecified with withdrawal, uncomplicated: Secondary | ICD-10-CM | POA: Diagnosis not present

## 2022-03-25 LAB — CBC WITH DIFFERENTIAL/PLATELET
Abs Immature Granulocytes: 0.02 10*3/uL (ref 0.00–0.07)
Basophils Absolute: 0.1 10*3/uL (ref 0.0–0.1)
Basophils Relative: 1 %
Eosinophils Absolute: 0 10*3/uL (ref 0.0–0.5)
Eosinophils Relative: 0 %
HCT: 35.3 % — ABNORMAL LOW (ref 36.0–46.0)
Hemoglobin: 11.7 g/dL — ABNORMAL LOW (ref 12.0–15.0)
Immature Granulocytes: 0 %
Lymphocytes Relative: 16 %
Lymphs Abs: 1.1 10*3/uL (ref 0.7–4.0)
MCH: 32.8 pg (ref 26.0–34.0)
MCHC: 33.1 g/dL (ref 30.0–36.0)
MCV: 98.9 fL (ref 80.0–100.0)
Monocytes Absolute: 1.3 10*3/uL — ABNORMAL HIGH (ref 0.1–1.0)
Monocytes Relative: 19 %
Neutro Abs: 4.3 10*3/uL (ref 1.7–7.7)
Neutrophils Relative %: 64 %
Platelets: 199 10*3/uL (ref 150–400)
RBC: 3.57 MIL/uL — ABNORMAL LOW (ref 3.87–5.11)
RDW: 13.2 % (ref 11.5–15.5)
WBC: 6.7 10*3/uL (ref 4.0–10.5)
nRBC: 0 % (ref 0.0–0.2)

## 2022-03-25 LAB — COMPREHENSIVE METABOLIC PANEL
ALT: 45 U/L — ABNORMAL HIGH (ref 0–44)
AST: 51 U/L — ABNORMAL HIGH (ref 15–41)
Albumin: 2.8 g/dL — ABNORMAL LOW (ref 3.5–5.0)
Alkaline Phosphatase: 168 U/L — ABNORMAL HIGH (ref 38–126)
Anion gap: 7 (ref 5–15)
BUN: 8 mg/dL (ref 8–23)
CO2: 22 mmol/L (ref 22–32)
Calcium: 8.5 mg/dL — ABNORMAL LOW (ref 8.9–10.3)
Chloride: 105 mmol/L (ref 98–111)
Creatinine, Ser: 0.31 mg/dL — ABNORMAL LOW (ref 0.44–1.00)
GFR, Estimated: >60 mL/min (ref >60)
Glucose, Bld: 96 mg/dL (ref 70–99)
Potassium: 3.8 mmol/L (ref 3.5–5.1)
Sodium: 134 mmol/L — ABNORMAL LOW (ref 135–145)
Total Bilirubin: 1.2 mg/dL (ref 0.3–1.2)
Total Protein: 5.9 g/dL — ABNORMAL LOW (ref 6.5–8.1)

## 2022-03-25 LAB — PHOSPHORUS: Phosphorus: 2.6 mg/dL (ref 2.5–4.6)

## 2022-03-25 LAB — MAGNESIUM: Magnesium: 2.2 mg/dL (ref 1.7–2.4)

## 2022-03-25 MED ORDER — LORAZEPAM 1 MG PO TABS
1.0000 mg | ORAL_TABLET | ORAL | Status: AC | PRN
  Administered 2022-03-26: 2 mg via ORAL
  Filled 2022-03-25: qty 2

## 2022-03-25 MED ORDER — DIAZEPAM 5 MG PO TABS
10.0000 mg | ORAL_TABLET | Freq: Two times a day (BID) | ORAL | Status: DC
  Administered 2022-03-25: 10 mg via ORAL
  Filled 2022-03-25: qty 2

## 2022-03-25 MED ORDER — DIAZEPAM 5 MG PO TABS
5.0000 mg | ORAL_TABLET | Freq: Two times a day (BID) | ORAL | Status: DC
  Administered 2022-03-25 – 2022-03-26 (×2): 5 mg via ORAL
  Filled 2022-03-25 (×2): qty 1

## 2022-03-25 MED ORDER — LORAZEPAM 2 MG/ML IJ SOLN
1.0000 mg | INTRAMUSCULAR | Status: AC | PRN
  Administered 2022-03-26: 1 mg via INTRAVENOUS
  Administered 2022-03-27: 3 mg via INTRAVENOUS
  Filled 2022-03-25: qty 2
  Filled 2022-03-25: qty 1

## 2022-03-25 NOTE — Progress Notes (Signed)
   03/25/22 1215  Vitals  Temp 99 F (37.2 C)  Temp Source Axillary  BP (!) 101/50  MAP (mmHg) (!) 63  BP Location Right Arm  BP Method Automatic  Patient Position (if appropriate) Lying  Pulse Rate 89  Resp (!) 22  Level of Consciousness  Level of Consciousness Responds to Voice  MEWS COLOR  MEWS Score Color Yellow  Oxygen Therapy  SpO2 95 %  O2 Device Room Air  MEWS Score  MEWS Temp 0  MEWS Systolic 0  MEWS Pulse 0  MEWS RR 1  MEWS LOC 1  MEWS Score 2   Patient responds to voice and touch but drowsy.  Will not remain awake long enough to be assisted eating lunch.  MD made aware.  Will continue to monitor.  Angie Fava, RN

## 2022-03-25 NOTE — Progress Notes (Signed)
PT Cancellation Note  Patient Details Name: [facility] Bradley MRN: [id number] DOB: 01-Jun-1951   Cancelled Treatment:    Reason Eval/Treat Not Completed: (P) Fatigue/lethargy limiting ability to participate. Pt unarousable to voice, gentle touch, or noxious stimuli (sternal rub and fingernail pressure). RN notified. Will follow up as schedule and pt status allows which may be another day.  Coolidge Breeze, PT, DPT [facility] Office: [phone]   Coolidge Breeze 03/25/2022, 12:42 PM

## 2022-03-25 NOTE — Progress Notes (Signed)
PROGRESS NOTE  Ashley Bradley  DOB: 03/17/1951  PCP: Ashley Pilon, FNP DZH:[id number]  DOA: 03/22/2022  LOS: 3 days  Hospital Day: 4  Brief narrative: CHARLE Bradley is a 71 y.o. female with PMH significant for cerebral palsy with seizure disorder, alcohol abuse with alcohol withdrawal seizure, depression, HTN, HLD, prolonged QTc, history of C. Difficile 90-19, patient presented to the ER with wanting to detox from alcohol. Her son passed away the end of Dec 24, 2022 and she states she has been in severe depression and has been in bed since then with continuous drinking daily.  She states that she drinks approximately 10-12 Coors light per day.  She orders beer via delivery.  Her husband is sober and does not drink in the house. She also has not showered since 2022/12/24.  She states that she has stayed in bed since then and wears diapers which her husband changes. She has been compliant on her Prozac and Keppra.  She states it has been about 2 to 3 years since her last seizure which was associated with alcohol withdrawal at that time.  In the ED, she was tachycardic to 130s CBC unremarkable.  BMP with potassium low at 3.7, normal renal function On exam, she was noted to have diffuse rash all over her body which is worse on her back. She has no lesions in her mouth.   The rash was discussed with [facility] however without adequate examination in person, they were unable to provide significant recommendations.  They did not recommend urgent transfer.  Patient also did not wish to transfer.  Bed availability of course also unavailable beds at [facility].   Patient was admitted to [facility] for alcohol withdrawal and ongoing monitoring of rash as well as suspicion of severe depression.  She did not endorse any suicidal ideation.   Subjective: Patient was seen and examined this morning.  Lying down in bed.  Not in distress.  Continues to have tremors.  Patient states he has mild tremors all her life but  this is worse than her baseline.  Remains on CIWA protocol. Pending PT eval  Assessment and plan: Chronic alcoholism  alcohol withdrawal  Patient apparently been drinking heavily since her son expired in 12/24/2022.  She presented to the ED wanting to be detoxed. Last drink in the morning of 03/22/2022 Currently on CIWA protocol with scheduled Valium 5 mg twice daily as well as as needed Ativan.  Continue the same.  I have suggested the nurse to consider signs and symptoms other than tremors to calculate CIWA score as she seems to have mild tremor at baseline Continue folate, multivitamin, thiamine Continue IV fluid, regular diet  History of seizure disorder, cerebral palsy Patient reports that her last withdrawal seizure was about 2 to 3 years ago. Continue Keppra  Dermatitis Likely because of bedridden status.  Apparently, she has not showered or been out of bed since end of 12-24-22 and wearing diapers for voiding and have [facility] did discuss with [facility] derm ([facility]) but no pics able to be reviewed; their initial rec was to consider triamcinolone or silvadene cream. Much of her rash looks like seborrheic and/or irritant contact dermatitis especially since it's worse on her back which if she's been laying in bed for 3 months would seem expected; scalp also has seborrheic appearance and very neglected hair Started on ketoconazole shampoo every other day to hair/scalp Please bathe patient daily Continue triamcinolone 0.025% (low potency) cream and ketoconazole cream  to body affected with rash (chest, back, arms, legs, etc)   Major depression Reports compliance to Prozac.   Denies suicidal ideation but patient grossly depressed since her son's death the end of December 23, 2022.  She has been laying in bed ordering delivery of beer and has not showered since 12-23-22 either. Continue Prozac Psychiatry consult offered.  Patient refused and wanted to keep the follow-up plan with her counselor as before.  Sinus  tachycardia Remains tachycardic in low 100s.  Probably because of dehydration alcohol withdrawal.  Physiological.  Monitor for now.   Essential hypertension Losartan remain on hold  Diarrhea Initially complaint of diarrhea.  GI panel and C. difficile assays were ordered but patient did not have any bowel movement after that.  No fever.  WBC count normal.  Physical deconditioning bed bound since end of 12-23-22 PT eval pending   Spastic hemiplegic cerebral palsy  Right lower extremity noted with increased tone and decreased flexibility Continue Keppra  Goals of care   Code Status: Full Code    Mobility: PT eval ordered  Skin assessment: Generalized rashes noted.    Nutritional status:  Body mass index is 24.07 kg/m.          Diet:  Diet Order             Diet regular Room service appropriate? No; Fluid consistency: Thin  Diet effective now                   DVT prophylaxis:  enoxaparin (LOVENOX) injection 40 mg Start: 03/22/22 2215   Antimicrobials: Topical ketoconazole Fluid: Reduce normal saline to 50 mill per hour Consultants: None Family Communication: None at bedside   Status is: Inpatient  Continue inhospital care because: Ongoing management for alcohol withdrawal, weakness, pending PT eval Level of care: Progressive   Dispo: The patient is from: Home              Anticipated d/c is to: Pending clinical course              Patient currently is not medically stable to d/c.   Difficult to place patient No     Infusions:   sodium chloride 75 mL/hr at 03/25/22 0132    Scheduled Meds:  diazepam  5 mg Oral Q12H   enoxaparin (LOVENOX) injection  40 mg Subcutaneous Q24H   FLUoxetine  40 mg Oral q morning   folic acid  1 mg Oral Daily   ketoconazole   Topical Daily   ketoconazole   Topical QODAY   levETIRAcetam  500 mg Oral BID   multivitamin with minerals  1 tablet Oral Daily   sodium chloride flush  3 mL Intravenous Q12H   thiamine  100 mg  Oral Daily   Or   thiamine  100 mg Intravenous Daily   triamcinolone   Topical BID    PRN meds: acetaminophen **OR** acetaminophen, LORazepam **OR** LORazepam, oxyCODONE   Antimicrobials: Anti-infectives (From admission, onward)    None       Objective: Vitals:   03/25/22 1215 03/25/22 1330  BP: (!) 101/50 111/66  Pulse: 89 89  Resp: (!) 22 (!) 24  Temp: 99 F (37.2 C) 97.8 F (36.6 C)  SpO2: 95% 95%    Intake/Output Summary (Last 24 hours) at 03/25/2022 1347 Last data filed at 03/25/2022 1120 Gross per 24 hour  Intake 480 ml  Output 400 ml  Net 80 ml    Filed Weights   03/22/22 2118  Weight: 65.6 kg   Weight change:  Body mass index is 24.07 kg/m.   Physical Exam: General exam: Pleasant elderly Caucasian female.  Not in physical distress Skin: No rashes, lesions or ulcers. HEENT: Atraumatic, normocephalic, no obvious bleeding Lungs: Clear to auscultation bilaterally CVS: Sinus tachycardia, no murmur GI/Abd soft, nontender, nondistended, bowel sound present CNS: Alert, awake, oriented x3.  Mild to moderate bilateral upper extremity tremors noted. Psychiatry: Depressed look Extremities: No pedal edema, no calf tenderness  Data Review: I have personally reviewed the laboratory data and studies available.  F/u labs ordered Unresulted Labs (From admission, onward)     Start     Ordered   03/25/22 0500  CBC with Differential/Platelet  Daily at 5am,   R     Question:  Specimen collection method  Answer:  Lab=Lab collect   03/24/22 0745   03/25/22 0500  Comprehensive metabolic panel  Daily at 5am,   R     Question:  Specimen collection method  Answer:  Lab=Lab collect   03/24/22 0745   03/25/22 0500  Magnesium  Daily at 5am,   R     Question:  Specimen collection method  Answer:  Lab=Lab collect   03/24/22 0745   03/25/22 0500  Phosphorus  Daily at 5am,   R     Question:  Specimen collection method  Answer:  Lab=Lab collect   03/24/22 0745   03/22/22  1145  Gastrointestinal Panel by PCR , Stool  (Gastrointestinal Panel by PCR, Stool                                                                                                                                                     **Does Not include CLOSTRIDIUM DIFFICILE testing. **If CDIFF testing is needed, place order from the "C Difficile Testing" order set.**)  Once,   URGENT        03/22/22 1144            Signed, Terrilee Croak, MD [facility] 03/25/2022

## 2022-03-25 NOTE — Progress Notes (Signed)
RN was alerted by PT to patient being lethargic and unarousable.  RN and charge RN assessed patient who responded to both sternal rub and voice.  Patient was able to answer correctly when asked where she was, and to respond appropriately when asked if she was sleepy and hungry.  Patient does fall back asleep quickly after waking up.  Will continue to monitor closely.  Angie Fava, RN

## 2022-03-25 NOTE — Progress Notes (Signed)
Patient alert, fed herself a grilled cheese sandwich and soup with set up assistance.  CIWA score 4, VS WDL.  Angie Fava, RN

## 2022-03-25 NOTE — Progress Notes (Signed)
Patient noted to be hallucinating this morning, reporting "monster legs in bed with her."  Nightshift RN reported pt spoke to husband in room last night while husband was not present.  Patient given 3 mg Ativan and slept through breakfast afterward.  Patient unable to hold water cup to take medications due to weakness/sleepiness and tremors.  Says she does not want to go to rehab bc reported being left alone in bathroom and having a fall.  MD aware.  Angie Fava, RN

## 2022-03-25 NOTE — Progress Notes (Signed)
PT Cancellation Note  Patient Details Name: Ashley Bradley MRN: [id number] DOB: 1951/01/13   Cancelled Treatment:    Reason Eval/Treat Not Completed: (P) Fatigue/lethargy limiting ability to participate. Pt had received ativan shortly prior to attempting evaluation, pt lethargic/sleepy, pt and RN requesting return at later time. Will follow up as schedule and pt status allows.   Coolidge Breeze, PT, DPT [facility] Office: [phone] Coolidge Breeze 03/25/2022, 10:22 AM

## 2022-03-26 ENCOUNTER — Other Ambulatory Visit: Payer: Self-pay

## 2022-03-26 ENCOUNTER — Encounter (HOSPITAL_COMMUNITY): Payer: Self-pay | Admitting: Internal Medicine

## 2022-03-26 DIAGNOSIS — F10930 Alcohol use, unspecified with withdrawal, uncomplicated: Secondary | ICD-10-CM | POA: Diagnosis not present

## 2022-03-26 LAB — CBC WITH DIFFERENTIAL/PLATELET
Abs Immature Granulocytes: 0.03 10*3/uL (ref 0.00–0.07)
Basophils Absolute: 0.1 10*3/uL (ref 0.0–0.1)
Basophils Relative: 1 %
Eosinophils Absolute: 0.1 10*3/uL (ref 0.0–0.5)
Eosinophils Relative: 2 %
HCT: 40.1 % (ref 36.0–46.0)
Hemoglobin: 12.7 g/dL (ref 12.0–15.0)
Immature Granulocytes: 1 %
Lymphocytes Relative: 18 %
Lymphs Abs: 1.1 10*3/uL (ref 0.7–4.0)
MCH: 32.9 pg (ref 26.0–34.0)
MCHC: 31.7 g/dL (ref 30.0–36.0)
MCV: 103.9 fL — ABNORMAL HIGH (ref 80.0–100.0)
Monocytes Absolute: 1.7 10*3/uL — ABNORMAL HIGH (ref 0.1–1.0)
Monocytes Relative: 28 %
Neutro Abs: 3.1 10*3/uL (ref 1.7–7.7)
Neutrophils Relative %: 50 %
Platelets: 194 10*3/uL (ref 150–400)
RBC: 3.86 MIL/uL — ABNORMAL LOW (ref 3.87–5.11)
RDW: 13.2 % (ref 11.5–15.5)
WBC: 6.1 10*3/uL (ref 4.0–10.5)
nRBC: 0 % (ref 0.0–0.2)

## 2022-03-26 LAB — COMPREHENSIVE METABOLIC PANEL
ALT: 39 U/L (ref 0–44)
AST: 43 U/L — ABNORMAL HIGH (ref 15–41)
Albumin: 2.6 g/dL — ABNORMAL LOW (ref 3.5–5.0)
Alkaline Phosphatase: 162 U/L — ABNORMAL HIGH (ref 38–126)
Anion gap: 9 (ref 5–15)
BUN: 9 mg/dL (ref 8–23)
CO2: 21 mmol/L — ABNORMAL LOW (ref 22–32)
Calcium: 8.4 mg/dL — ABNORMAL LOW (ref 8.9–10.3)
Chloride: 103 mmol/L (ref 98–111)
Creatinine, Ser: <0.3 mg/dL — ABNORMAL LOW (ref 0.44–1.00)
Glucose, Bld: 92 mg/dL (ref 70–99)
Potassium: 3.5 mmol/L (ref 3.5–5.1)
Sodium: 133 mmol/L — ABNORMAL LOW (ref 135–145)
Total Bilirubin: 1.1 mg/dL (ref 0.3–1.2)
Total Protein: 5.9 g/dL — ABNORMAL LOW (ref 6.5–8.1)

## 2022-03-26 LAB — MAGNESIUM: Magnesium: 1.9 mg/dL (ref 1.7–2.4)

## 2022-03-26 LAB — PHOSPHORUS: Phosphorus: 2.9 mg/dL (ref 2.5–4.6)

## 2022-03-26 MED ORDER — SACCHAROMYCES BOULARDII 250 MG PO CAPS
500.0000 mg | ORAL_CAPSULE | Freq: Every evening | ORAL | Status: DC
  Administered 2022-03-26 – 2022-04-11 (×17): 500 mg via ORAL
  Filled 2022-03-26 (×17): qty 2

## 2022-03-26 MED ORDER — DIAZEPAM 5 MG PO TABS
5.0000 mg | ORAL_TABLET | Freq: Every day | ORAL | Status: DC
  Administered 2022-03-27 – 2022-03-29 (×3): 5 mg via ORAL
  Filled 2022-03-26 (×3): qty 1

## 2022-03-26 MED ORDER — KETOCONAZOLE 2 % EX SHAM
MEDICATED_SHAMPOO | CUTANEOUS | Status: DC
  Filled 2022-03-26: qty 120

## 2022-03-26 MED ORDER — HYDROCORTISONE 1 % EX CREA
TOPICAL_CREAM | Freq: Two times a day (BID) | CUTANEOUS | Status: DC
  Administered 2022-03-27: 1 via TOPICAL
  Filled 2022-03-26 (×5): qty 28

## 2022-03-26 MED ORDER — FLUCONAZOLE 100 MG PO TABS
100.0000 mg | ORAL_TABLET | Freq: Every day | ORAL | Status: DC
  Administered 2022-03-26 – 2022-04-10 (×16): 100 mg via ORAL
  Filled 2022-03-26 (×16): qty 1

## 2022-03-26 MED ORDER — POLYETHYLENE GLYCOL 3350 17 G PO PACK
17.0000 g | PACK | Freq: Every day | ORAL | Status: DC | PRN
  Administered 2022-03-26 – 2022-04-06 (×2): 17 g via ORAL
  Filled 2022-03-26 (×2): qty 1

## 2022-03-26 NOTE — Progress Notes (Signed)
PROGRESS NOTE  Ashley Bradley  DOB: 1951/01/12  PCP: Soundra Pilon, FNP FBP:[id number]  DOA: 03/22/2022  LOS: 4 days  Hospital Day: 5  Brief narrative: Ashley Bradley is a 71 y.o. female with PMH significant for cerebral palsy with seizure disorder, alcohol abuse with alcohol withdrawal seizure, depression, HTN, HLD, prolonged QTc, history of C. Difficile 90-19, patient presented to the ER with wanting to detox from alcohol. Her son passed away the end of 2023/01/11 and she states she has been in severe depression and has been in bed since then with continuous drinking daily.  She states that she drinks approximately 10-12 [employer] light per day.  She orders beer via delivery.  Her husband is sober and does not drink in the house. She also has not showered since 01/11/23.  She states that she has stayed in bed since then and wears diapers which her husband changes. She has been compliant on her Prozac and Keppra.  She states it has been about 2 to 3 years since her last seizure which was associated with alcohol withdrawal at that time.  In the ED, she was tachycardic to 130s CBC unremarkable.  BMP with potassium low at 3.7, normal renal function On exam, she was noted to have diffuse rash all over her body which is worse on her back. She has no lesions in her mouth.   The rash was discussed with [facility] dermatology however without adequate examination in person, they were unable to provide significant recommendations.  They did not recommend urgent transfer.  Patient also did not wish to transfer.  Bed availability of course also unavailable beds at [facility].   Patient was admitted to [facility] for alcohol withdrawal and ongoing monitoring of rash as well as suspicion of severe depression.  She did not endorse any suicidal ideation.   Subjective: Patient was seen and examined this morning.  Sitting up in chair this morning. Not in physical distress.  Tremors improving.  Alertness improving. PT eval  recommend SNF.  Assessment and plan: Chronic alcoholism  alcohol withdrawal  Patient apparently been drinking heavily since her son expired in 2023/01/11.  She presented to the ED wanting to be detoxed. Last drink in the morning of 03/22/2022 Currently on CIWA protocol with scheduled Valium 5 mg twice daily as well as as needed Ativan.  Tremors improving.  Reduce Valium dose to 5 units daily.  Can stop IV fluid. Continue folate, multivitamin, thiamine  Hypokalemia Potassium level improved with replacement. Recent Labs  Lab 03/22/22 1202 03/24/22 0905 03/25/22 0419 03/26/22 0424  K 3.7 2.8* 3.8 3.5  MG 2.0 1.7 2.2 1.9  PHOS  --   --  2.6 2.9    History of seizure disorder, cerebral palsy Patient reports that her last withdrawal seizure was about 2 to 3 years ago. Continue Keppra  Dermatitis Likely because of bedridden status.  Apparently, she has not showered or been out of bed since end of January 11, 2023 and wearing diapers for voiding and have [facility] ER did discuss with [facility] derm (Dr. Nolon Bussing) but no pics able to be reviewed; their initial rec was to consider triamcinolone or silvadene cream. Much of her rash looks like seborrheic and/or irritant contact dermatitis especially since it's worse on her back which if she's been laying in bed for 3 months would seem expected; scalp also has seborrheic appearance and very neglected hair Started on ketoconazole shampoo every other day to hair/scalp Please bathe patient daily Continue triamcinolone 0.025% (low  potency) cream and ketoconazole cream to body affected with rash (chest, back, arms, legs, etc)   Major depression Reports compliance to Prozac.   Denies suicidal ideation but patient grossly depressed since her son's death the end of 2022/12/28.  She has been laying in bed ordering delivery of beer and has not showered since 2022/12/28 either. Continue Prozac Psychiatry consult offered.  Patient refused and wanted to keep the follow-up plan with her  counselor as before.  Sinus tachycardia Tachycardia improved with control of withdrawal symptoms with benzodiazepine.     Essential hypertension Losartan remain on hold.  Blood pressure normal.  Resume when blood pressure rises.  Physical deconditioning bed bound since end of Dec 28, 2022 PT eval obtained.  SNF recommended.   Spastic hemiplegic cerebral palsy  Right lower extremity noted with increased tone and decreased flexibility Continue Keppra  Goals of care   Code Status: Full Code    Mobility: PT eval ordered  Skin assessment: Generalized rashes noted.    Nutritional status:  Body mass index is 24.07 kg/m.          Diet:  Diet Order             Diet regular Room service appropriate? No; Fluid consistency: Thin  Diet effective now                   DVT prophylaxis:  enoxaparin (LOVENOX) injection 40 mg Start: 03/22/22 2215   Antimicrobials: Topical steroid, oral fluconazole Fluid: Can stop IV fluid Consultants: None Family Communication: None at bedside   Status is: Inpatient  Continue inhospital care because: Pending SNF Level of care: Progressive   Dispo: The patient is from: Home              Anticipated d/c is to: Pending clinical course              Patient currently is not medically stable to d/c.   Difficult to place patient No     Infusions:     Scheduled Meds:  [START ON 03/27/2022] diazepam  5 mg Oral Q0600   enoxaparin (LOVENOX) injection  40 mg Subcutaneous Q24H   fluconazole  100 mg Oral Daily   FLUoxetine  40 mg Oral q morning   folic acid  1 mg Oral Daily   hydrocortisone cream   Topical BID   [START ON 03/27/2022] ketoconazole   Topical QODAY   levETIRAcetam  500 mg Oral BID   multivitamin with minerals  1 tablet Oral Daily   saccharomyces boulardii  500 mg Oral QPM   sodium chloride flush  3 mL Intravenous Q12H   thiamine  100 mg Oral Daily   Or   thiamine  100 mg Intravenous Daily    PRN meds: acetaminophen **OR**  acetaminophen, LORazepam **OR** LORazepam, oxyCODONE, polyethylene glycol   Antimicrobials: Anti-infectives (From admission, onward)    Start     Dose/Rate Route Frequency Ordered Stop   03/26/22 1300  fluconazole (DIFLUCAN) tablet 100 mg        100 mg Oral Daily 03/26/22 1203         Objective: Vitals:   03/26/22 0616 03/26/22 1316  BP: (!) 149/75 111/61  Pulse: 99 88  Resp: 20 18  Temp: 98.4 F (36.9 C) (!) 97.4 F (36.3 C)  SpO2:  95%    Intake/Output Summary (Last 24 hours) at 03/26/2022 1500 Last data filed at 03/26/2022 1303 Gross per 24 hour  Intake 700 ml  Output 725 ml  Net -25 ml   Filed Weights   03/22/22 2118  Weight: 65.6 kg   Weight change:  Body mass index is 24.07 kg/m.   Physical Exam: General exam: Pleasant elderly Caucasian female.  Not in physical distress Skin: No rashes, lesions or ulcers. HEENT: Atraumatic, normocephalic, no obvious bleeding Lungs: Clear to auscultation bilaterally CVS: Regular rate and rhythm, no murmur GI/Abd soft, nontender, nondistended, bowel sound present CNS: Alert, awake, oriented x3.  Improving tremors bilaterally Psychiatry: Depressed look Extremities: No pedal edema, no calf tenderness  Data Review: I have personally reviewed the laboratory data and studies available.  F/u labs ordered Unresulted Labs (From admission, onward)     Start     Ordered   03/25/22 0500  CBC with Differential/Platelet  Daily at 5am,   R     Question:  Specimen collection method  Answer:  Lab=Lab collect   03/24/22 0745   03/25/22 0500  Comprehensive metabolic panel  Daily at 5am,   R     Question:  Specimen collection method  Answer:  Lab=Lab collect   03/24/22 0745   03/25/22 0500  Magnesium  Daily at 5am,   R     Question:  Specimen collection method  Answer:  Lab=Lab collect   03/24/22 0745   03/25/22 0500  Phosphorus  Daily at 5am,   R     Question:  Specimen collection method  Answer:  Lab=Lab collect   03/24/22 0745    03/22/22 1145  Gastrointestinal Panel by PCR , Stool  (Gastrointestinal Panel by PCR, Stool                                                                                                                                                     **Does Not include CLOSTRIDIUM DIFFICILE testing. **If CDIFF testing is needed, place order from the "C Difficile Testing" order set.**)  Once,   URGENT        03/22/22 1144            Signed, Terrilee Croak, MD [facility] 03/26/2022

## 2022-03-26 NOTE — Progress Notes (Signed)
Patient remained in chair after working with PT for 2+ hours.  Ate 75% of lunch, required no assistance.  4 staff assisted patient from chair to bed using Stedy.  This RN recommends 2 staff plus Stedy for transfers.  Patient tolerated well.  Angie Fava, RN

## 2022-03-26 NOTE — Progress Notes (Signed)
Pt's CIWA score 13; Ativan given; RN will continue monitor.

## 2022-03-26 NOTE — Evaluation (Signed)
Physical Therapy Evaluation Patient Details Name: Ashley Bradley MRN: [id number] DOB: Apr 05, 1951 Today's Date: 03/26/2022  History of Present Illness  Pt is a 71yo female presenting to [facility] ED on 9/19 via EMS reporting ETOH binge x6weeks with N/V/D and body rash; reporting lying in bed with husband changing diapers. 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  Clinical Impression   Pt presents with generalized weakness R>L given history of CP, impaired balance, max difficulty mobilizing, and decreased activity tolerance vs baseline. Pt to benefit from acute PT to address deficits. Pt tolerating transfer-level mobility today, requiring max +2 assist for squat pivot to recliner. Pt states that prior to 6 weeks ago, pt was ambulatory without AD and was working as Charity fundraiser, hopeful to return to PLOF and open to ST-SNF to address deficits. PT to progress mobility as tolerated, and will continue to follow acutely.         Recommendations for follow up therapy are one component of a multi-disciplinary discharge planning process, led by the attending physician.  Recommendations may be updated based on patient status, additional functional criteria and insurance authorization.  Follow Up Recommendations Skilled nursing-short term rehab (<3 hours/day) Can patient physically be transported by private vehicle: No    Assistance Recommended at Discharge Frequent or constant Supervision/Assistance  Patient can return home with the following  Two people to help with walking and/or transfers;A lot of help with bathing/dressing/bathroom;Assist for transportation;Help with stairs or ramp for entrance;Assistance with cooking/housework    Equipment Recommendations None recommended by PT  Recommendations for Other Services       Functional Status Assessment Patient has had a recent decline in their functional status and demonstrates the ability to make significant improvements in function in a  reasonable and predictable amount of time.     Precautions / Restrictions Precautions Precautions: Fall Precaution Comments: R hemiparesis secondary to CP Restrictions Weight Bearing Restrictions: No      Mobility  Bed Mobility Overal bed mobility: Needs Assistance Bed Mobility: Supine to Sit     Supine to sit: Mod assist, +2 for physical assistance     General bed mobility comments: mod +2 for trunk and LE management, scooting to EOB with assist of bed pads.    Transfers Overall transfer level: Needs assistance Equipment used: Rolling walker (2 wheels) Transfers: Sit to/from Stand, Bed to chair/wheelchair/BSC Sit to Stand: +2 physical assistance, Mod assist     Squat pivot transfers: Max assist, +2 physical assistance     General transfer comment: mod +2 for power up, rise, steadying and semi-standing position. Attempts with RW x2. Opted for swing pivot to recliner with max +2 for truncal support and LE management, boost back in chair once seated.    Ambulation/Gait               General Gait Details: unable at this time  Stairs            Wheelchair Mobility    Modified Rankin (Stroke Patients Only)       Balance Overall balance assessment: Needs assistance Sitting-balance support: No upper extremity supported, Feet supported Sitting balance-Leahy Scale: Poor Sitting balance - Comments: preference for L lateral lean, requiring at least min PT assist to correct   Standing balance support: During functional activity, Bilateral upper extremity supported Standing balance-Leahy Scale: Zero Standing balance comment: max +2  Pertinent Vitals/Pain Pain Assessment Pain Assessment: 0-10 Pain Score: 6  Pain Location: back and neck Pain Descriptors / Indicators: Sore, Discomfort Pain Intervention(s): Limited activity within patient's tolerance, Monitored during session, Repositioned    Home Living  Family/patient expects to be discharged to:: Private residence Living Arrangements: Spouse/significant other Available Help at Discharge: Available 24 hours/day Type of Home: House Home Access: Stairs to enter Entrance Stairs-Rails: Right Entrance Stairs-Number of Steps: 3 in front, 2 in back Alternate Level Stairs-Number of Steps: 1 flight Home Layout: Two level;Able to live on main level with bedroom/bathroom Home Equipment: Rolling Walker (2 wheels);Wheelchair - manual;Toilet riser;BSC/3in1      Prior Function Prior Level of Function : Needs assist             Mobility Comments: pt works as an Therapist, 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 Pt endorses prior to 6 weeks ago, was walking without AD. For 6 weeks, pt has been bed-level       Hand Dominance   Dominant Hand: Left    Extremity/Trunk Assessment   Upper Extremity Assessment Upper Extremity Assessment: Defer to OT evaluation    Lower Extremity Assessment Lower Extremity Assessment: Generalized weakness;RLE deficits/detail RLE Deficits / Details: lacking approx 20 degrees knee extension and tolerates knee flexion to approx 45 deg RLE: Unable to fully assess due to pain    Cervical / Trunk Assessment Cervical / Trunk Assessment: Other exceptions Cervical / Trunk Exceptions: forward head, rounded shoulders - history of ACDF  Communication   Communication: No difficulties  Cognition Arousal/Alertness: Awake/alert Behavior During Therapy: WFL for tasks assessed/performed Overall Cognitive Status: Impaired/Different from baseline Area of Impairment: Problem solving, Following commands                       Following Commands: Follows one step commands with increased time     Problem Solving: Slow processing, Difficulty sequencing, Requires verbal cues, Requires tactile cues          General Comments General comments (skin integrity, edema, etc.): red rash all over back, LEs, UEs, and  assuming torso but did not visualize torso.    Exercises     Assessment/Plan    PT Assessment Patient needs continued PT services  PT Problem List Decreased mobility;Decreased strength;Decreased activity tolerance;Decreased balance;Decreased knowledge of use of DME;Pain;Decreased safety awareness;Decreased coordination;Decreased range of motion;Decreased cognition;Decreased skin integrity       PT Treatment Interventions DME instruction;Therapeutic activities;Gait training;Balance training;Patient/family education;Therapeutic exercise;Functional mobility training    PT Goals (Current goals can be found in the Care Plan section)  Acute Rehab PT Goals Patient Stated Goal: back to walking PT Goal Formulation: With patient Time For Goal Achievement: 04/09/22 Potential to Achieve Goals: Good    Frequency Min 3X/week     Co-evaluation               AM-PAC PT "6 Clicks" Mobility  Outcome Measure Help needed turning from your back to your side while in a flat bed without using bedrails?: A Lot Help needed moving from lying on your back to sitting on the side of a flat bed without using bedrails?: A Lot Help needed moving to and from a bed to a chair (including a wheelchair)?: A Lot Help needed standing up from a chair using your arms (e.g., wheelchair or bedside chair)?: A Lot Help needed to walk in hospital room?: Total Help needed climbing 3-5 steps with a railing? : Total 6 Click  Score: 10    End of Session Equipment Utilized During Treatment: Gait belt Activity Tolerance: Patient limited by fatigue Patient left: in chair;with call bell/phone within reach;with chair alarm set;with family/visitor present Nurse Communication: Mobility status PT Visit Diagnosis: Other abnormalities of gait and mobility (R26.89);Muscle weakness (generalized) (M62.81)    Time: 9417-4081 PT Time Calculation (min) (ACUTE ONLY): 24 min   Charges:   PT Evaluation $PT Eval Low Complexity: 1  Low PT Treatments $Therapeutic Activity: 8-22 mins        Marye Round, PT DPT Acute Rehabilitation Services Pager [phone]  Office [phone]   Tyrone Apple E Christain [place] 03/26/2022, 10:55 AM

## 2022-03-27 DIAGNOSIS — F10930 Alcohol use, unspecified with withdrawal, uncomplicated: Secondary | ICD-10-CM | POA: Diagnosis not present

## 2022-03-27 LAB — CBC WITH DIFFERENTIAL/PLATELET
Abs Immature Granulocytes: 0.02 10*3/uL (ref 0.00–0.07)
Basophils Absolute: 0.1 10*3/uL (ref 0.0–0.1)
Basophils Relative: 1 %
Eosinophils Absolute: 0.1 10*3/uL (ref 0.0–0.5)
Eosinophils Relative: 2 %
HCT: 34.6 % — ABNORMAL LOW (ref 36.0–46.0)
Hemoglobin: 11.5 g/dL — ABNORMAL LOW (ref 12.0–15.0)
Immature Granulocytes: 0 %
Lymphocytes Relative: 19 %
Lymphs Abs: 1.1 10*3/uL (ref 0.7–4.0)
MCH: 32.7 pg (ref 26.0–34.0)
MCHC: 33.2 g/dL (ref 30.0–36.0)
MCV: 98.3 fL (ref 80.0–100.0)
Monocytes Absolute: 1.4 10*3/uL — ABNORMAL HIGH (ref 0.1–1.0)
Monocytes Relative: 25 %
Neutro Abs: 3.1 10*3/uL (ref 1.7–7.7)
Neutrophils Relative %: 53 %
Platelets: 248 10*3/uL (ref 150–400)
RBC: 3.52 MIL/uL — ABNORMAL LOW (ref 3.87–5.11)
RDW: 13.2 % (ref 11.5–15.5)
WBC: 5.8 10*3/uL (ref 4.0–10.5)
nRBC: 0 % (ref 0.0–0.2)

## 2022-03-27 LAB — PHOSPHORUS: Phosphorus: 3.3 mg/dL (ref 2.5–4.6)

## 2022-03-27 LAB — COMPREHENSIVE METABOLIC PANEL
ALT: 36 U/L (ref 0–44)
AST: 33 U/L (ref 15–41)
Albumin: 2.5 g/dL — ABNORMAL LOW (ref 3.5–5.0)
Alkaline Phosphatase: 146 U/L — ABNORMAL HIGH (ref 38–126)
Anion gap: 7 (ref 5–15)
BUN: 7 mg/dL — ABNORMAL LOW (ref 8–23)
CO2: 26 mmol/L (ref 22–32)
Calcium: 8.2 mg/dL — ABNORMAL LOW (ref 8.9–10.3)
Chloride: 101 mmol/L (ref 98–111)
Creatinine, Ser: <0.3 mg/dL — ABNORMAL LOW (ref 0.44–1.00)
Glucose, Bld: 94 mg/dL (ref 70–99)
Potassium: 3 mmol/L — ABNORMAL LOW (ref 3.5–5.1)
Sodium: 134 mmol/L — ABNORMAL LOW (ref 135–145)
Total Bilirubin: 0.9 mg/dL (ref 0.3–1.2)
Total Protein: 5.7 g/dL — ABNORMAL LOW (ref 6.5–8.1)

## 2022-03-27 LAB — MAGNESIUM: Magnesium: 1.9 mg/dL (ref 1.7–2.4)

## 2022-03-27 MED ORDER — CALAMINE EX LOTN
1.0000 | TOPICAL_LOTION | Freq: Three times a day (TID) | CUTANEOUS | Status: DC
  Administered 2022-03-28 – 2022-04-12 (×45): 1 via TOPICAL
  Filled 2022-03-27 (×5): qty 177

## 2022-03-27 NOTE — Plan of Care (Signed)

## 2022-03-27 NOTE — [place] FL2 (Signed)
[place] MEDICAID FL2 LEVEL OF CARE SCREENING TOOL     IDENTIFICATION  Patient Name: Ashley Bradley Birthdate: 1950/12/16 Sex: female Admission Date (Current Location): 03/22/2022  [facility] and [place] Number:  Producer, television/film/video and Address:  [facility],  [address] [place], [place] [address]      Provider Number: [id number]  Attending Physician Name and Address:  Lorin Glass, MD  Relative Name and Phone Number:  Kamilia Carollo (spouse) [phone] Sanjna Haskew (spouse) [phone])    Current Level of Care: SNF Recommended Level of Care: Skilled Nursing Facility Prior Approval Number:    Date Approved/Denied:   PASRR Number: pending  Discharge Plan: SNF    Current Diagnoses: Patient Active Problem List   Diagnosis Date Noted   Alcohol withdrawal (HCC) 03/22/2022   Dermatitis 03/22/2022   Physical deconditioning 03/22/2022   Broken tooth 03/22/2022   Diarrhea 03/22/2022   Essential hypertension 05/12/2021   Transaminitis 05/12/2021   Total bilirubin, elevated 05/12/2021   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/2020   H/O febrile seizure 04/15/2020   DTs (delirium tremens) (HCC) 01/24/2020   Alcohol withdrawal seizure (HCC) 01/24/2020   High anion gap metabolic acidosis 10/13/2017   Melena 06/29/2016   Syncope 01/26/2016   Prolonged Q-T interval on ECG 01/26/2016   History of Clostridium difficile colitis 01/26/2016   Elevated troponin    Alcohol use disorder, moderate, in early remission, dependence (HCC) 01/21/2016   AKI (acute kidney injury) (HCC) 01/16/2016   Seizure disorder (HCC) 09/24/2013   Hyponatremia 09/20/2013   Major depression 03/19/2013   Alcohol intoxication in relapsed alcoholic (HCC) 03/19/2013    Orientation RESPIRATION BLADDER Height & Weight     Self, Time, Situation, Place  Normal Continent Weight: 65.6 kg Height:  5\' 5"  (165.1 cm)  BEHAVIORAL SYMPTOMS/MOOD NEUROLOGICAL BOWEL NUTRITION STATUS     Convulsions/Seizures Continent Diet (regular diet)  AMBULATORY STATUS COMMUNICATION OF NEEDS Skin   Limited Assist Verbally Other (Comment) (rash; no treatment)                       Personal Care Assistance Level of Assistance  Bathing, Dressing, Feeding Bathing Assistance: Limited assistance Feeding assistance: Independent Dressing Assistance: Limited assistance     Functional Limitations Info  Sight, Hearing, Speech Sight Info: Impaired (glasses) Hearing Info: Adequate Speech Info: Adequate    SPECIAL CARE FACTORS FREQUENCY  PT (By licensed PT), OT (By licensed OT)     PT Frequency: 5 x / week OT Frequency: 5 x / week            Contractures Contractures Info: Present    Additional Factors Info  Code Status, Allergies, Psychotropic (full code) Code Status Info: full code (full code) Allergies Info: Ancef (Cefazolin), Pantopaque (Iophendylate), Macrodantin (Nitrofurantoin), Morphine And Related (Ancef (Cefazolin), Pantopaque (Iophendylate), Macrodantin (Nitrofurantoin), Morphine And Related) Psychotropic Info: diazepam (VALIUM) tablet 5 mg         Current Medications (03/27/2022):  This is the current hospital active medication list Current Facility-Administered Medications  Medication Dose Route Frequency Provider Last Rate Last Admin   acetaminophen (TYLENOL) tablet 650 mg  650 mg Oral Q6H PRN 03/29/2022, MD   650 mg at 03/26/22 1056   Or   acetaminophen (TYLENOL) suppository 650 mg  650 mg Rectal Q6H PRN 03/28/22, MD       diazepam (VALIUM) tablet 5 mg  5 mg Oral Q0600 Dahal, Lewie Chamber, MD   5 mg at 03/27/22 0501  enoxaparin (LOVENOX) injection 40 mg  40 mg Subcutaneous Q24H Dwyane Dee, MD   40 mg at 03/26/22 2115   fluconazole (DIFLUCAN) tablet 100 mg  100 mg Oral Daily Dahal, Marlowe Aschoff, MD   100 mg at 03/27/22 0839   FLUoxetine (PROZAC) capsule 40 mg  40 mg Oral q morning Dwyane Dee, MD   40 mg at 19/62/22 9798   folic acid (FOLVITE) tablet 1  mg  1 mg Oral Daily Dwyane Dee, MD   1 mg at 03/27/22 9211   hydrocortisone cream 1 %   Topical BID Terrilee Croak, MD   Given at 03/26/22 2116   ketoconazole (NIZORAL) 2 % shampoo   Topical QODAY Polly Cobia, RPH   Given at 03/27/22 0840   levETIRAcetam (KEPPRA) tablet 500 mg  500 mg Oral BID Dwyane Dee, MD   500 mg at 03/27/22 9417   LORazepam (ATIVAN) tablet 1-4 mg  1-4 mg Oral Q1H PRN Terrilee Croak, MD   2 mg at 03/26/22 0400   Or   LORazepam (ATIVAN) injection 1-4 mg  1-4 mg Intravenous Q1H PRN Dahal, Marlowe Aschoff, MD   3 mg at 03/27/22 1019   multivitamin with minerals tablet 1 tablet  1 tablet Oral Daily Dwyane Dee, MD   1 tablet at 03/27/22 4081   oxyCODONE (Oxy IR/ROXICODONE) immediate release tablet 5 mg  5 mg Oral Q4H PRN Dwyane Dee, MD       polyethylene glycol (MIRALAX / GLYCOLAX) packet 17 g  17 g Oral Daily PRN Dahal, Marlowe Aschoff, MD   17 g at 03/26/22 1655   saccharomyces boulardii (FLORASTOR) capsule 500 mg  500 mg Oral QPM Dahal, Binaya, MD   500 mg at 03/26/22 1711   sodium chloride flush (NS) 0.9 % injection 3 mL  3 mL Intravenous Q12H Dwyane Dee, MD   3 mL at 03/26/22 2116   thiamine (VITAMIN B1) tablet 100 mg  100 mg Oral Daily Dwyane Dee, MD   100 mg at 03/27/22 4481   Or   thiamine (VITAMIN B1) injection 100 mg  100 mg Intravenous Daily Dwyane Dee, MD         Discharge Medications: Please see discharge summary for a list of discharge medications.  Relevant Imaging Results:  Relevant Lab Results:   Additional Information SSN [id number] [phone] [id number])  Henrietta Dine, RN

## 2022-03-27 NOTE — Evaluation (Signed)
Occupational Therapy Evaluation Patient Details Name: Ashley Bradley MRN: [id number] DOB: [birth year] Today's Date: 03/27/2022   History of Present Illness Pt is a 71yo female presenting to [facility] ED on 9/19 via EMS reporting ETOH binge x6weeks with N/V/D and body rash; reporting lying in bed with husband changing diapers. 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   Clinical Impression    Ashley Bradley is a 71 year old woman admitted to hospital with above medical history as well as generalized weakness, decreased activity tolerance, impaired balance, poor upper body strength, and residual right sided weakness from CP. Patient right side lying when therapist entered the room and not overly agreeable to therapy evaluation citing Ativan administration. With some encouragement and agreeing to return to bed patient agreed to work with therapist. She required mod x 2 to transfer into sitting, required arm propping to sit edge of bed initially and briefly able to sit unsupported. Worked on lifting head and cervical positioning at edge of bed. Patient max x 2 to return to supine. Therapist removed pillow from behind patient's head and placed towel roll to reduce forward head and encouraged cervical retractions. Patient will benefit from skilled OT services while in hospital to improve deficits and learn compensatory strategies as needed in order to reduce caregiver burden.      Recommendations for follow up therapy are one component of a multi-disciplinary discharge planning process, led by the attending physician.  Recommendations may be updated based on patient status, additional functional criteria and insurance authorization.   Follow Up Recommendations  Skilled nursing-short term rehab (<3 hours/day)    Assistance Recommended at Discharge Frequent or constant Supervision/Assistance  Patient can return home with the following Two people to help with  bathing/dressing/bathroom;Two people to help with walking and/or transfers;Assistance with cooking/housework;Direct supervision/assist for medications management;Assist for transportation;Help with stairs or ramp for entrance;Direct supervision/assist for financial management;Assistance with feeding    Functional Status Assessment  Patient has had a recent decline in their functional status and/or demonstrates limited ability to make significant improvements in function in a reasonable and predictable amount of time  Equipment Recommendations  Other (comment) (defer)    Recommendations for Other Services       Precautions / Restrictions Precautions Precautions: Fall Precaution Comments: 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 Max +2 to return to supine.    Transfers                   General transfer comment: deferred      Balance Overall balance assessment: Needs assistance Sitting-balance support: Bilateral upper extremity supported, Feet unsupported Sitting balance-Leahy Scale: Poor                                     ADL either performed or assessed with clinical judgement   ADL Overall ADL's : Needs assistance/impaired Eating/Feeding: Set up;Bed level   Grooming: Moderate assistance;Bed level   Upper Body Bathing: Bed level;Moderate assistance   Lower Body Bathing: Total assistance;Bed level   Upper Body Dressing :  Maximal assistance;Bed level   Lower Body Dressing: Total assistance;Bed level     Toilet Transfer Details (indicate cue type and reason): unable Toileting- Clothing Manipulation and Hygiene: Total  assistance;Bed level;+2 for physical assistance       Functional mobility during ADLs: +2 for physical assistance;+2 for safety/equipment       Vision Baseline Vision/History: 1 Wears glasses Ability to See in Adequate Light: 1 Impaired Patient Visual Report: No change from baseline       Perception     Praxis      Pertinent Vitals/Pain Pain Assessment Pain Assessment: Faces Faces Pain Scale: Hurts a little bit Pain Location: neck Pain Descriptors / Indicators: Sore, Discomfort, Grimacing Pain Intervention(s): Monitored during session, Limited activity within patient's tolerance, Repositioned     Hand Dominance Left   Extremity/Trunk Assessment Upper Extremity Assessment Upper Extremity Assessment: RUE deficits/detail;LUE deficits/detail;Generalized weakness RUE Deficits / Details: 3-/5 shoulder, otherwise 3/5 strength LUE Deficits / Details: functional ROM grossly 3/5 strength   Lower Extremity Assessment Lower Extremity Assessment: Defer to PT evaluation   Cervical / Trunk Assessment Cervical / Trunk Exceptions: forward head, rounded shoulders - history of ACDF   Communication Communication Communication: No difficulties   Cognition Arousal/Alertness: Awake/alert Behavior During Therapy: WFL for tasks assessed/performed Overall Cognitive Status: Within Functional Limits for tasks assessed                                 General Comments: Able to answer questions and follow commands.     General Comments       Exercises     Shoulder Instructions      Home Living Family/patient expects to be discharged to:: Skilled nursing facility Living Arrangements: Spouse/significant other Available Help at Discharge: Available 24 hours/day Type of Home: House Home Access: Stairs to enter [employer] of Steps: 3 in front, 2 in back Entrance Stairs-Rails: Right Home Layout: Two level;Able to live on main level with  bedroom/bathroom Alternate Level Stairs-Number of Steps: 1 flight   Bathroom Shower/Tub: Walk-in Soil scientist Toilet: Handicapped height     Home Equipment: Agricultural consultant (2 wheels);Wheelchair - manual;Toilet riser;BSC/3in1          Prior Functioning/Environment Prior Level of Function : Needs assist             Mobility Comments: pt works as an Charity fundraiser - stopped when she started drinking 6 weeks ago. Pt endorses prior to 6 weeks ago, was walking without AD. For 6 weeks, pt has been bed-level ADLs Comments: Reports bed bound for last 6 weeks - wit hall ADLs at bed level with assistance from husband        OT Problem List: Decreased strength;Decreased range of motion;Decreased activity tolerance;Impaired balance (sitting and/or standing);Decreased coordination;Decreased cognition;Decreased safety awareness;Decreased knowledge of use of DME or AE;Pain;Impaired UE functional use      OT Treatment/Interventions: Self-care/ADL training;Therapeutic exercise;DME and/or AE instruction;Therapeutic activities;Balance training;Patient/family education;Neuromuscular education    OT Goals(Current goals can be found in the care plan section) Acute Rehab OT Goals Patient Stated Goal: to get stronger OT Goal Formulation: With patient Time For Goal Achievement: 04/10/22 Potential to Achieve Goals: Fair  OT Frequency: Min 2X/week    Co-evaluation              AM-PAC OT "6 Clicks" Daily Activity     Outcome Measure Help from another person eating meals?: A Little Help from  another person taking care of personal grooming?: A Lot Help from another person toileting, which includes using toliet, bedpan, or urinal?: Total Help from another person bathing (including washing, rinsing, drying)?: A Lot Help from another person to put on and taking off regular upper body clothing?: A Lot Help from another person to put on and taking off regular lower body clothing?: Total 6 Click Score:  11   End of Session Nurse Communication: Mobility status  Activity Tolerance: Patient limited by fatigue Patient left: in bed;with call bell/phone within reach;with bed alarm set  OT Visit Diagnosis: Muscle weakness (generalized) (M62.81)                Time: 1534-1550 OT Time Calculation (min): 16 min Charges:  OT General Charges $OT Visit: 1 Visit OT Evaluation $OT Eval Low Complexity: 1 Low  Gustavo Lah, OTR/L [place]  Office [phone]   Lenward Chancellor 03/27/2022, 4:21 PM

## 2022-03-27 NOTE — TOC Initial Note (Signed)
Transition of Care [facility]) - Initial/Assessment Note    Patient Details  Name: Ashley Bradley MRN: [id number] Date of Birth: 1950/12/09  Transition of Care [facility]) CM/SW Contact:    Henrietta Dine, RN Phone Number: 03/27/2022, 11:58 AM  Clinical Narrative:  pt from home; A&O x 3; plan d/c to SNF; pt agreeable SNF, prefers [place];PASARR level 2 pending; TOC will to begin bed search in [facility].             Expected Discharge Plan: Skilled Nursing Facility Barriers to Discharge: Continued Medical Work up   Patient Goals and CMS Choice        Expected Discharge Plan and Services Expected Discharge Plan: [place]   Discharge Planning Services: CM Consult                                          Prior Living Arrangements/Services     Patient language and need for interpreter reviewed:: Yes        Need for Family Participation in Patient Care: Yes (Comment) Care giver support system in place?: Yes (comment)   Criminal Activity/Legal Involvement Pertinent to Current Situation/Hospitalization: No - Comment as needed  Activities of Daily Living Home Assistive Devices/Equipment: Gilford Rile (specify type) ADL Screening (condition at time of admission) Patient's cognitive ability adequate to safely complete daily activities?: Yes Is the patient deaf or have difficulty hearing?: No Does the patient have difficulty seeing, even when wearing glasses/contacts?: No Does the patient have difficulty concentrating, remembering, or making decisions?: No Patient able to express need for assistance with ADLs?: No Does the patient have difficulty dressing or bathing?: Yes Independently performs ADLs?: No Communication: Independent Dressing (OT): Needs assistance Is this a change from baseline?: Change from baseline, expected to last >3 days Grooming: Needs assistance Is this a change from baseline?: Change from baseline, expected to last >3  days Feeding: Independent Bathing: Needs assistance Is this a change from baseline?: Change from baseline, expected to last >3 days Toileting: Needs assistance Is this a change from baseline?: Change from baseline, expected to last >3days In/Out Bed: Needs assistance Is this a change from baseline?: Change from baseline, expected to last >3 days Walks in Home: Needs assistance Is this a change from baseline?: Change from baseline, expected to last >3 days Does the patient have difficulty walking or climbing stairs?: Yes Weakness of Legs: Both Weakness of Arms/Hands: Both  Permission Sought/Granted Permission sought to share information with : Case Manager Permission granted to share information with : Yes, Verbal Permission Granted  Share Information with NAME: Lenor Coffin, RN, CM           Emotional Assessment Appearance:: Appears stated age Attitude/Demeanor/Rapport: Gracious Affect (typically observed): Accepting Orientation: : Oriented to Self, Oriented to Place, Oriented to  Time, Oriented to Situation Alcohol / Substance Use: Alcohol Use Psych Involvement: Yes (comment)  Admission diagnosis:  Alcohol withdrawal ([place]) [F10.939] Rash and nonspecific skin eruption [R21] Alcohol withdrawal syndrome without complication (HCC) [J94.174] Alcoholic cirrhosis, unspecified whether ascites present [facility]) [K70.30] Patient Active Problem List   Diagnosis Date Noted   Alcohol withdrawal ([place]) 03/22/2022   Dermatitis 03/22/2022   Physical deconditioning 03/22/2022   Broken tooth 03/22/2022   Diarrhea 03/22/2022   Essential hypertension 05/12/2021   Transaminitis 05/12/2021   Total bilirubin, elevated 05/12/2021   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[place]) 04/15/2020  H/O febrile seizure 04/15/2020   DTs (delirium tremens) (HCC) 01/24/2020   Alcohol withdrawal seizure (HCC) 01/24/2020   High anion gap metabolic acidosis 10/13/2017   Melena 06/29/2016   Syncope 01/26/2016    Prolonged Q-T interval on ECG 01/26/2016   History of Clostridium difficile colitis 01/26/2016   Elevated troponin    Alcohol use disorder, moderate, in early remission, dependence (HCC) 01/21/2016   AKI (acute kidney injury) (HCC) 01/16/2016   Seizure disorder (HCC) 09/24/2013   Hyponatremia 09/20/2013   Major depression 03/19/2013   Alcohol intoxication in relapsed alcoholic (HCC) 03/19/2013   PCP:  Soundra Pilon, FNP Pharmacy:   [facility] [address], [place] - [address] [place] [address] Phone: [phone] Fax: [phone]     Social Determinants of Health (SDOH) Interventions    Readmission Risk Interventions     No data to display

## 2022-03-27 NOTE — Progress Notes (Signed)
PROGRESS NOTE  Ashley Bradley  DOB: 10-10-1950  PCP: Soundra Pilon, FNP ATF:[id number]  DOA: 03/22/2022  LOS: 5 days  Hospital Day: 6  Brief narrative: Ashley Bradley is a 71 y.o. female with PMH significant for cerebral palsy with seizure disorder, alcohol abuse with alcohol withdrawal seizure, depression, HTN, HLD, prolonged QTc, history of C. Difficile 90-19, patient presented to the ER with wanting to detox from alcohol. Her son passed away the end of Dec 22, 2022 and she states she has been in severe depression and has been in bed since then with continuous drinking daily.  She states that she drinks approximately 10-12 Coors light per day.  She orders beer via delivery.  Her husband is sober and does not drink in the house. She also has not showered since 2022/12/22.  She states that she has stayed in bed since then and wears diapers which her husband changes. She has been compliant on her Prozac and Keppra.  She states it has been about 2 to 3 years since her last seizure which was associated with alcohol withdrawal at that time.  In the ED, she was tachycardic to 130s CBC unremarkable.  BMP with potassium low at 3.7, normal renal function On exam, she was noted to have diffuse rash all over her body which is worse on her back. She has no lesions in her mouth.   The rash was discussed with [facility] dermatology however without adequate examination in person, they were unable to provide significant recommendations.  They did not recommend urgent transfer.  Patient also did not wish to transfer.  Bed availability of course also unavailable beds at [facility].   Patient was admitted to [facility] for alcohol withdrawal and ongoing monitoring of rash as well as suspicion of severe depression.  She did not endorse any suicidal ideation.   Subjective: Patient was seen and examined this morning.   Propped up in bed.  Not in distress.  Feels more awake and energetic today.   Valium has been stopped.  Husband at  bedside today.  Assessment and plan: Chronic alcoholism  alcohol withdrawal  Patient apparently been drinking heavily since her son expired in 12/22/22.  She presented to the ED wanting to be detoxed. Last drink in the morning of 03/22/2022 Was monitored for withdrawal symptoms with CIWA protocol.  Treated with a scheduled Valium 5 mg twice daily as well as as needed Ativan.  No need of IV fluid Continue folate, multivitamin, thiamine  Hypokalemia Potassium level improved with replacement. Recent Labs  Lab 03/22/22 1202 03/24/22 0905 03/25/22 0419 03/26/22 0424 03/27/22 0621  K 3.7 2.8* 3.8 3.5 3.0*  MG 2.0 1.7 2.2 1.9 1.9  PHOS  --   --  2.6 2.9 3.3   History of seizure disorder, cerebral palsy Patient reports that her last withdrawal seizure was about 2 to 3 years ago. Continue Keppra  Dermatitis Likely because of bedridden status.  Apparently, she has not showered or been out of bed since end of 12/22/22 and wearing diapers for voiding and have [facility] ER did discuss with [facility] derm (Dr. Nolon Bussing) but no pics able to be reviewed; their initial rec was to consider triamcinolone or silvadene cream. Much of her rash looks like seborrheic and/or irritant contact dermatitis especially since it's worse on her back which if she's been laying in bed for 3 months would seem expected; scalp also has seborrheic appearance and very neglected hair Started on ketoconazole shampoo every other day to hair/scalp  Please bathe patient daily Continue triamcinolone 0.025% (low potency) cream and ketoconazole cream to body affected with rash (chest, back, arms, legs, etc).  Add calamine lotion.   Major depression Reports compliance to Prozac.   Denies suicidal ideation but patient grossly depressed since her son's death the end of Dec 29, 2022.  She has been laying in bed ordering delivery of beer and has not showered since 12/29/2022 either. Continue Prozac Psychiatry consult offered.  Patient refused and wanted to keep  the follow-up plan with her counselor as before.  Sinus tachycardia Tachycardia improved with control of withdrawal symptoms with benzodiazepine.     Essential hypertension Losartan remain on hold.  Blood pressure normal.  Resume when blood pressure rises.  Physical deconditioning bed bound since end of Dec 29, 2022 PT eval obtained.  SNF recommended.   Spastic hemiplegic cerebral palsy  Right lower extremity noted with increased tone and decreased flexibility Continue Keppra  Goals of care   Code Status: Full Code    Mobility: PT eval ordered  Skin assessment: Generalized rashes noted.    Nutritional status:  Body mass index is 24.07 kg/m.          Diet:  Diet Order             Diet regular Room service appropriate? No; Fluid consistency: Thin  Diet effective now                   DVT prophylaxis:  enoxaparin (LOVENOX) injection 40 mg Start: 03/22/22 2215   Antimicrobials: Topical steroid, oral fluconazole Fluid: No longer on IV fluid Consultants: None Family Communication: None at bedside   Status is: Inpatient  Continue inhospital care because: Pending SNF Level of care: Progressive   Dispo: The patient is from: Home              Anticipated d/c is to: Pending SNF              Patient currently is medically stable to d/c.   Difficult to place patient No     Infusions:     Scheduled Meds:  calamine  1 Application Topical TID   diazepam  5 mg Oral Q0600   enoxaparin (LOVENOX) injection  40 mg Subcutaneous Q24H   fluconazole  100 mg Oral Daily   FLUoxetine  40 mg Oral q morning   folic acid  1 mg Oral Daily   hydrocortisone cream   Topical BID   ketoconazole   Topical QODAY   levETIRAcetam  500 mg Oral BID   multivitamin with minerals  1 tablet Oral Daily   saccharomyces boulardii  500 mg Oral QPM   sodium chloride flush  3 mL Intravenous Q12H   thiamine  100 mg Oral Daily   Or   thiamine  100 mg Intravenous Daily    PRN  meds: acetaminophen **OR** acetaminophen, LORazepam **OR** LORazepam, oxyCODONE, polyethylene glycol   Antimicrobials: Anti-infectives (From admission, onward)    Start     Dose/Rate Route Frequency Ordered Stop   03/26/22 1300  fluconazole (DIFLUCAN) tablet 100 mg        100 mg Oral Daily 03/26/22 1203         Objective: Vitals:   03/27/22 1004 03/27/22 1340  BP: (!) 111/58 (!) 101/58  Pulse: 99 98  Resp:  18  Temp:  99.2 F (37.3 C)  SpO2:  93%    Intake/Output Summary (Last 24 hours) at 03/27/2022 1437 Last data filed at 03/27/2022 0858 Gross per  24 hour  Intake --  Output 1750 ml  Net -1750 ml   Filed Weights   03/22/22 2118  Weight: 65.6 kg   Weight change:  Body mass index is 24.07 kg/m.   Physical Exam: General exam: Pleasant elderly Caucasian female.  Not in physical distress. Skin: Generalized very pruritic skin rash HEENT: Atraumatic, normocephalic, no obvious bleeding Lungs: Clear to auscultation bilaterally CVS: Regular rate and rhythm, no murmur GI/Abd soft, nontender, nondistended, bowel sound present CNS: Alert, awake, oriented x3.  Improved tremors in both sides Psychiatry: Mood appropriate Extremities: No pedal edema, no calf tenderness  Data Review: I have personally reviewed the laboratory data and studies available.  F/u labs ordered Unresulted Labs (From admission, onward)     Start     Ordered   03/25/22 0500  CBC with Differential/Platelet  Daily at 5am,   R     Question:  Specimen collection method  Answer:  Lab=Lab collect   03/24/22 0745   03/25/22 0500  Comprehensive metabolic panel  Daily at 5am,   R     Question:  Specimen collection method  Answer:  Lab=Lab collect   03/24/22 0745   03/25/22 0500  Magnesium  Daily at 5am,   R     Question:  Specimen collection method  Answer:  Lab=Lab collect   03/24/22 0745   03/25/22 0500  Phosphorus  Daily at 5am,   R     Question:  Specimen collection method  Answer:  Lab=Lab collect    03/24/22 0745   03/22/22 1145  Gastrointestinal Panel by PCR , Stool  (Gastrointestinal Panel by PCR, Stool                                                                                                                                                     **Does Not include CLOSTRIDIUM DIFFICILE testing. **If CDIFF testing is needed, place order from the "C Difficile Testing" order set.**)  Once,   URGENT        03/22/22 1144            Signed, Terrilee Croak, MD [facility] 03/27/2022

## 2022-03-28 DIAGNOSIS — F10930 Alcohol use, unspecified with withdrawal, uncomplicated: Secondary | ICD-10-CM | POA: Diagnosis not present

## 2022-03-28 LAB — COMPREHENSIVE METABOLIC PANEL
ALT: 36 U/L (ref 0–44)
AST: 32 U/L (ref 15–41)
Albumin: 2.5 g/dL — ABNORMAL LOW (ref 3.5–5.0)
Alkaline Phosphatase: 142 U/L — ABNORMAL HIGH (ref 38–126)
Anion gap: 8 (ref 5–15)
BUN: 9 mg/dL (ref 8–23)
CO2: 25 mmol/L (ref 22–32)
Calcium: 8.1 mg/dL — ABNORMAL LOW (ref 8.9–10.3)
Chloride: 102 mmol/L (ref 98–111)
Creatinine, Ser: 0.37 mg/dL — ABNORMAL LOW (ref 0.44–1.00)
GFR, Estimated: >60 mL/min (ref >60)
Glucose, Bld: 90 mg/dL (ref 70–99)
Potassium: 3 mmol/L — ABNORMAL LOW (ref 3.5–5.1)
Sodium: 135 mmol/L (ref 135–145)
Total Bilirubin: 0.6 mg/dL (ref 0.3–1.2)
Total Protein: 5.8 g/dL — ABNORMAL LOW (ref 6.5–8.1)

## 2022-03-28 LAB — CBC WITH DIFFERENTIAL/PLATELET
Abs Immature Granulocytes: 0.02 10*3/uL (ref 0.00–0.07)
Basophils Absolute: 0.1 10*3/uL (ref 0.0–0.1)
Basophils Relative: 1 %
Eosinophils Absolute: 0.2 10*3/uL (ref 0.0–0.5)
Eosinophils Relative: 3 %
HCT: 33.4 % — ABNORMAL LOW (ref 36.0–46.0)
Hemoglobin: 11.1 g/dL — ABNORMAL LOW (ref 12.0–15.0)
Immature Granulocytes: 0 %
Lymphocytes Relative: 23 %
Lymphs Abs: 1.2 10*3/uL (ref 0.7–4.0)
MCH: 32.8 pg (ref 26.0–34.0)
MCHC: 33.2 g/dL (ref 30.0–36.0)
MCV: 98.8 fL (ref 80.0–100.0)
Monocytes Absolute: 1.3 10*3/uL — ABNORMAL HIGH (ref 0.1–1.0)
Monocytes Relative: 26 %
Neutro Abs: 2.3 10*3/uL (ref 1.7–7.7)
Neutrophils Relative %: 47 %
Platelets: 304 10*3/uL (ref 150–400)
RBC: 3.38 MIL/uL — ABNORMAL LOW (ref 3.87–5.11)
RDW: 13.2 % (ref 11.5–15.5)
WBC: 5 10*3/uL (ref 4.0–10.5)
nRBC: 0 % (ref 0.0–0.2)

## 2022-03-28 LAB — MAGNESIUM: Magnesium: 2 mg/dL (ref 1.7–2.4)

## 2022-03-28 LAB — PHOSPHORUS: Phosphorus: 3.7 mg/dL (ref 2.5–4.6)

## 2022-03-28 MED ORDER — POTASSIUM CHLORIDE CRYS ER 20 MEQ PO TBCR
40.0000 meq | EXTENDED_RELEASE_TABLET | Freq: Once | ORAL | Status: AC
  Administered 2022-03-28: 40 meq via ORAL
  Filled 2022-03-28: qty 2

## 2022-03-28 NOTE — Plan of Care (Signed)
  Problem: Education: Goal: Knowledge of General Education information will improve Description Including pain rating scale, medication(s)/side effects and non-pharmacologic comfort measures Outcome: Progressing   Problem: Health Behavior/Discharge Planning: Goal: Ability to manage health-related needs will improve Outcome: Progressing   

## 2022-03-28 NOTE — Care Management Important Message (Signed)
Important Message  Patient Details IM Letter given to the Patient. Name: Ashley Bradley MRN: [id number] Date of Birth: 03-01-1951   Medicare Important Message Given:  Yes     Kerin Salen 03/28/2022, 11:49 AM

## 2022-03-28 NOTE — Plan of Care (Signed)
°  Problem: Clinical Measurements: °Goal: Ability to maintain clinical measurements within normal limits will improve °Outcome: Progressing °Goal: Will remain free from infection °Outcome: Progressing °Goal: Diagnostic test results will improve °Outcome: Progressing °Goal: Respiratory complications will improve °Outcome: Progressing °  °

## 2022-03-28 NOTE — Progress Notes (Signed)
PROGRESS NOTE  Ashley Bradley  DOB: 1950/11/09  PCP: Soundra Pilon, FNP FVC:[id number]  DOA: 03/22/2022  LOS: 6 days  Hospital Day: 7  Brief narrative: Ashley Bradley is a 71 y.o. female with PMH significant for cerebral palsy with seizure disorder, alcohol abuse with alcohol withdrawal seizure, depression, HTN, HLD, prolonged QTc, history of C. Difficile 90-19, patient presented to the ER with wanting to detox from alcohol. Her son passed away the end of 2023/01/15 and she states she has been in severe depression and has been in bed since then with continuous drinking daily.  She states that she drinks approximately 10-12 Coors light per day.  She orders beer via delivery.  Her husband is sober and does not drink in the house. She also has not showered since January 15, 2023.  She states that she has stayed in bed since then and wears diapers which her husband changes. She has been compliant on her Prozac and Keppra.  She states it has been about 2 to 3 years since her last seizure which was associated with alcohol withdrawal at that time.  In the ED, she was tachycardic to 130s CBC unremarkable.  BMP with potassium low at 3.7, normal renal function On exam, she was noted to have diffuse rash all over her body which is worse on her back. She has no lesions in her mouth.   The rash was discussed with [facility] dermatology however without adequate examination in person, they were unable to provide significant recommendations.  They did not recommend urgent transfer.  Patient also did not wish to transfer.  Bed availability of course also unavailable beds at [facility].   Patient was admitted to [facility] for alcohol withdrawal and ongoing monitoring of rash as well as suspicion of severe depression.  She did not endorse any suicidal ideation.   Subjective: Patient was seen and examined this morning.   Much more awake, alert, conversational.  Not in withdrawal symptoms.  Family not at bedside.  Assessment and  plan: Chronic alcoholism  alcohol withdrawal  Patient apparently been drinking heavily since her son expired in 01/15/2023.  She presented to the ED wanting to be detoxed. Last drink in the morning of 03/22/2022 Was monitored for withdrawal symptoms with CIWA protocol.  Treated with a scheduled Valium 5 mg twice daily as well as as needed Ativan.  Out of withdrawal window now.  Valium and Ativan has been stopped.  Mental status much better. Continue folate, multivitamin, thiamine  Hypokalemia Potassium level low at 3 this morning.  Replacement given. Recent Labs  Lab 03/24/22 0905 03/25/22 0419 03/26/22 0424 03/27/22 0621 03/28/22 0403  K 2.8* 3.8 3.5 3.0* 3.0*  MG 1.7 2.2 1.9 1.9 2.0  PHOS  --  2.6 2.9 3.3 3.7   History of seizure disorder, cerebral palsy Patient reports that her last withdrawal seizure was about 2 to 3 years ago. Continue Keppra  Dermatitis Likely because of bedridden status.  Apparently, she has not showered or been out of bed since end of 2023/01/15 and wearing diapers for voiding and have [facility] did discuss with [facility] derm (Dr. Nolon Bussing) but no pics able to be reviewed; their initial rec was to consider triamcinolone or silvadene cream. Much of her rash looks like seborrheic and/or irritant contact dermatitis especially since it's worse on her back which if she's been laying in bed for 3 months would seem expected; scalp also has seborrheic appearance and very neglected hair Started on ketoconazole shampoo every  other day to hair/scalp Please bathe patient daily Continue triamcinolone 0.025% (low potency) cream and ketoconazole cream to body affected with rash (chest, back, arms, legs, etc).  Also added calamine lotion.   Major depression Reports compliance to Prozac.   Denies suicidal ideation but patient grossly depressed since her son's death the end of 15-Dec-2022.  She has been laying in bed ordering delivery of beer and has not showered since 12-15-22 either. Continue  Prozac Psychiatry consult offered.  Patient refused and wanted to keep the follow-up plan with her counselor as before.  Sinus tachycardia Tachycardia improved with control of withdrawal symptoms with benzodiazepine.     Essential hypertension Losartan remain on hold.  Blood pressure normal.  Resume when blood pressure rises.  Physical deconditioning bed bound since end of 12/15/22 PT eval obtained.  SNF recommended.   Spastic hemiplegic cerebral palsy  Right lower extremity noted with increased tone and decreased flexibility Continue Keppra  Goals of care   Code Status: Full Code    Mobility: PT eval ordered  Skin assessment: Generalized rashes noted.    Nutritional status:  Body mass index is 24.07 kg/m.          Diet:  Diet Order             Diet regular Room service appropriate? No; Fluid consistency: Thin  Diet effective now                   DVT prophylaxis:  enoxaparin (LOVENOX) injection 40 mg Start: 03/22/22 2215   Antimicrobials: Topical steroid, oral fluconazole Fluid: No longer on IV fluid Consultants: None Family Communication: None at bedside   Status is: Inpatient  Continue inhospital care because: Pending SNF Level of care: Med-Surg   Dispo: The patient is from: Home              Anticipated d/c is to: Pending SNF              Patient currently is medically stable to d/c.   Difficult to place patient No     Infusions:     Scheduled Meds:  calamine  1 Application Topical TID   diazepam  5 mg Oral Q0600   enoxaparin (LOVENOX) injection  40 mg Subcutaneous Q24H   fluconazole  100 mg Oral Daily   FLUoxetine  40 mg Oral q morning   folic acid  1 mg Oral Daily   hydrocortisone cream   Topical BID   ketoconazole   Topical QODAY   levETIRAcetam  500 mg Oral BID   multivitamin with minerals  1 tablet Oral Daily   potassium chloride  40 mEq Oral Once   saccharomyces boulardii  500 mg Oral QPM   sodium chloride flush  3 mL  Intravenous Q12H   thiamine  100 mg Oral Daily   Or   thiamine  100 mg Intravenous Daily    PRN meds: acetaminophen **OR** acetaminophen, oxyCODONE, polyethylene glycol   Antimicrobials: Anti-infectives (From admission, onward)    Start     Dose/Rate Route Frequency Ordered Stop   03/26/22 1300  fluconazole (DIFLUCAN) tablet 100 mg        100 mg Oral Daily 03/26/22 1203         Objective: Vitals:   03/28/22 0545 03/28/22 1342  BP: 135/70 (!) 127/110  Pulse: 95 95  Resp: 17 16  Temp: 100 F (37.8 C) 98.7 F (37.1 C)  SpO2: 98% 91%    Intake/Output Summary (Last  24 hours) at 03/28/2022 1558 Last data filed at 03/28/2022 1235 Gross per 24 hour  Intake 1203 ml  Output 2100 ml  Net -897 ml   Filed Weights   03/22/22 2118  Weight: 65.6 kg   Weight change:  Body mass index is 24.07 kg/m.   Physical Exam: General exam: Pleasant elderly Caucasian female.  Not in physical distress. Skin: Generalized very pruritic skin rash HEENT: Atraumatic, normocephalic, no obvious bleeding Lungs: Clear to auscultation bilaterally CVS: Regular rate and rhythm, no murmur GI/Abd soft, nontender, nondistended, bowel sound present CNS: Alert, awake, oriented x3.  Improved tremors in both sides Psychiatry: Mood appropriate Extremities: No pedal edema, no calf tenderness  Data Review: I have personally reviewed the laboratory data and studies available.  F/u labs ordered Unresulted Labs (From admission, onward)     Start     Ordered   03/22/22 1145  Gastrointestinal Panel by PCR , Stool  (Gastrointestinal Panel by PCR, Stool                                                                                                                                                     **Does Not include CLOSTRIDIUM DIFFICILE testing. **If CDIFF testing is needed, place order from the "C Difficile Testing" order set.**)  Once,   URGENT        03/22/22 1144            Signed, Lorin Glass,  MD [facility] 03/28/2022

## 2022-03-28 NOTE — Plan of Care (Signed)
  Problem: Education: Goal: Knowledge of General Education information will improve Description: Including pain rating scale, medication(s)/side effects and non-pharmacologic comfort measures 03/28/2022 1346 by Jerene Pitch, RN Outcome: Progressing 03/28/2022 0759 by Jerene Pitch, RN Outcome: Progressing   Problem: Health Behavior/Discharge Planning: Goal: Ability to manage health-related needs will improve 03/28/2022 1346 by Jerene Pitch, RN Outcome: Progressing 03/28/2022 0759 by Jerene Pitch, RN Outcome: Progressing

## 2022-03-29 DIAGNOSIS — F10930 Alcohol use, unspecified with withdrawal, uncomplicated: Secondary | ICD-10-CM | POA: Diagnosis not present

## 2022-03-29 NOTE — Progress Notes (Signed)
PROGRESS NOTE  Ashley Bradley  DOB: 05/01/1951  PCP: Soundra Pilon, FNP FUX:[id number]  DOA: 03/22/2022  LOS: 7 days  Hospital Day: 8  Brief narrative: Ashley Bradley is a 71 y.o. female with PMH significant for cerebral palsy with seizure disorder, alcohol abuse with alcohol withdrawal seizure, depression, HTN, HLD, prolonged QTc, history of C. Difficile 90-19, patient presented to the ER with wanting to detox from alcohol. Her son passed away the end of 12-19-2022 and she states she has been in severe depression and has been in bed since then with continuous drinking daily.  She states that she drinks approximately 10-12 Coors light per day.  She orders beer via delivery.  Her husband is sober and does not drink in the house. She also has not showered since 2022-12-19.  She states that she has stayed in bed since then and wears diapers which her husband changes. She has been compliant on her Prozac and Keppra.  She states it has been about 2 to 3 years since her last seizure which was associated with alcohol withdrawal at that time.  In the ED, she was tachycardic to 130s CBC unremarkable.  BMP with potassium low at 3.7, normal renal function On exam, she was noted to have diffuse rash all over her body which is worse on her back. She has no lesions in her mouth.   The rash was discussed with [facility] dermatology however without adequate examination in person, they were unable to provide significant recommendations.  They did not recommend urgent transfer.  Patient also did not wish to transfer.  Bed availability of course also unavailable beds at [facility].   Patient was admitted to [facility] for alcohol withdrawal and ongoing monitoring of rash as well as suspicion of severe depression.  She did not endorse any suicidal ideation.   Subjective: Patient was seen and examined this morning.   Awake, oriented x3.  Conversational.  Husband at bedside. Was seen by PT today.  Assessment and plan: Chronic  alcoholism  alcohol withdrawal  Patient apparently been drinking heavily since her son expired in 2022/12/19.  She presented to the ED wanting to be detoxed. Last drink in the morning of 03/22/2022 Was monitored for withdrawal symptoms with CIWA protocol.  Treated with a scheduled Valium 5 mg twice daily as well as as needed Ativan.  Out of withdrawal window now.  Valium and Ativan has been stopped.  Mental status much better. Continue folate, multivitamin, thiamine  Hypokalemia Potassium level low at 3 this morning.  Replacement given. Recent Labs  Lab 03/24/22 0905 03/25/22 0419 03/26/22 0424 03/27/22 0621 03/28/22 0403  K 2.8* 3.8 3.5 3.0* 3.0*  MG 1.7 2.2 1.9 1.9 2.0  PHOS  --  2.6 2.9 3.3 3.7   History of seizure disorder, cerebral palsy Patient reports that her last withdrawal seizure was about 2 to 3 years ago. Continue Keppra  Dermatitis Likely because of bedridden status.  Apparently, she has not showered or been out of bed since end of 2022-12-19 and wearing diapers for voiding and have [facility] did discuss with [facility] derm (Dr. Nolon Bussing) but no pics able to be reviewed; their initial rec was to consider triamcinolone or silvadene cream. Much of her rash looks like seborrheic and/or irritant contact dermatitis especially since it's worse on her back which if she's been laying in bed for 3 months would seem expected; scalp also has seborrheic appearance and very neglected hair Started on ketoconazole shampoo every other  day to hair/scalp Please bathe patient daily Continue triamcinolone 0.025% (low potency) cream and ketoconazole cream to body affected with rash (chest, back, arms, legs, etc).  Also added calamine lotion.   Major depression Reports compliance to Prozac.   Denies suicidal ideation but patient grossly depressed since her son's death the end of 2023/01/03.  She has been laying in bed ordering delivery of beer and has not showered since 2023/01/03 either. Continue Prozac Psychiatry  consult offered.  Patient refused and wanted to keep the follow-up plan with her counselor as before.  Sinus tachycardia Tachycardia improved with control of withdrawal symptoms with benzodiazepine.     Essential hypertension Losartan remain on hold.  Blood pressure normal.  Resume when blood pressure rises.  Physical deconditioning bed bound since end of 01-03-2023 PT eval obtained.  SNF recommended.   Spastic hemiplegic cerebral palsy  Right lower extremity noted with increased tone and decreased flexibility Continue Keppra  Goals of care   Code Status: Full Code    Mobility: PT eval ordered  Skin assessment: Generalized rashes noted.    Nutritional status:  Body mass index is 24.07 kg/m.          Diet:  Diet Order             Diet regular Room service appropriate? No; Fluid consistency: Thin  Diet effective now                   DVT prophylaxis:  enoxaparin (LOVENOX) injection 40 mg Start: 03/22/22 2215   Antimicrobials: Topical steroid, oral fluconazole Fluid: Not IV fluid Consultants: None Family Communication: None at bedside   Status is: Inpatient  Continue inhospital care because: Pending SNF Level of care: Med-Surg   Dispo: The patient is from: Home              Anticipated d/c is to: Pending SNF              Patient currently is medically stable to d/c.   Difficult to place patient No     Infusions:     Scheduled Meds:  calamine  1 Application Topical TID   enoxaparin (LOVENOX) injection  40 mg Subcutaneous Q24H   fluconazole  100 mg Oral Daily   FLUoxetine  40 mg Oral q morning   folic acid  1 mg Oral Daily   hydrocortisone cream   Topical BID   ketoconazole   Topical QODAY   levETIRAcetam  500 mg Oral BID   multivitamin with minerals  1 tablet Oral Daily   saccharomyces boulardii  500 mg Oral QPM   sodium chloride flush  3 mL Intravenous Q12H   thiamine  100 mg Oral Daily   Or   thiamine  100 mg Intravenous Daily    PRN  meds: acetaminophen **OR** acetaminophen, oxyCODONE, polyethylene glycol   Antimicrobials: Anti-infectives (From admission, onward)    Start     Dose/Rate Route Frequency Ordered Stop   03/26/22 1300  fluconazole (DIFLUCAN) tablet 100 mg        100 mg Oral Daily 03/26/22 1203         Objective: Vitals:   03/29/22 0745 03/29/22 1356  BP: 112/69 124/65  Pulse: 88 98  Resp: 18 20  Temp: 99.8 F (37.7 C) 99 F (37.2 C)  SpO2: 92% 93%    Intake/Output Summary (Last 24 hours) at 03/29/2022 1540 Last data filed at 03/29/2022 1300 Gross per 24 hour  Intake 483 ml  Output  1075 ml  Net -592 ml   Filed Weights   03/22/22 2118  Weight: 65.6 kg   Weight change:  Body mass index is 24.07 kg/m.   Physical Exam: General exam: Pleasant elderly Caucasian female.  Not in physical distress. Skin: Generalized very pruritic skin rash HEENT: Atraumatic, normocephalic, no obvious bleeding Lungs: Clear to auscultation bilaterally CVS: Regular rate and rhythm, no murmur GI/Abd soft, nontender, nondistended, bowel sound present CNS: Alert, awake, oriented x3.  Improved tremors in both sides Psychiatry: Mood appropriate Extremities: No pedal edema, no calf tenderness  Data Review: I have personally reviewed the laboratory data and studies available.  F/u labs ordered Unresulted Labs (From admission, onward)    None       Signed, Terrilee Croak, MD [facility] 03/29/2022

## 2022-03-29 NOTE — Progress Notes (Signed)
Chaplain met with Ashley Bradley who stated that she felt overwhelmed.  When chaplain inquired further, Ashley Bradley shared that she has been told she has to go to rehab to build up strength but that she is scared to go to rehab because of a history of a fall at a previous rehab facility. She is also worried about her husband who is in his 60s and who will be home alone. She stated that there was family support for him while she is in rehab.  Chaplain provided listening support as she processed those concerns. When chaplain asked about other things that might be overwhelming right now she stated that she did not want to talk about anything else.    [address], [place], [phone]

## 2022-03-29 NOTE — Progress Notes (Signed)
Physical Therapy Treatment Patient Details Name: Ashley Bradley MRN: [id number] DOB: 1951-05-26 Today's Date: 03/29/2022   History of Present Illness Pt is a 71yo female presenting to [facility] ED on 9/19 via EMS reporting ETOH binge x6weeks with N/V/D and body rash; reporting lying in bed with husband changing diapers. 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    PT Comments    Instructed pt in log roll technique for getting out of bed, pt able to roll R with verbal and manual cues, mod assist to raise trunk to sitting. Pt sat on edge of bed x 5 minutes. She refused to attempt sit to stand despite much encouragement and +2 assistance available. Pt was educated on benefits of getting out of bed, she continued to refuse. When asked why, she stated, "I just don't want to".  SNF recommended.    Recommendations for follow up therapy are one component of a multi-disciplinary discharge planning process, led by the attending physician.  Recommendations may be updated based on patient status, additional functional criteria and insurance authorization.  Follow Up Recommendations  Skilled nursing-short term rehab (<3 hours/day) Can patient physically be transported by private vehicle: No   Assistance Recommended at Discharge Frequent or constant Supervision/Assistance  Patient can return home with the following Two people to help with walking and/or transfers;A lot of help with bathing/dressing/bathroom;Assist for transportation;Help with stairs or ramp for entrance;Assistance with cooking/housework   Equipment Recommendations  None recommended by PT    Recommendations for Other Services       Precautions / Restrictions Precautions Precautions: Fall Precaution Comments: R hemiparesis secondary to CP Restrictions Weight Bearing Restrictions: No     Mobility  Bed Mobility Overal bed mobility: Needs Assistance Bed Mobility: Rolling, Sidelying to Sit Rolling: Min  guard Sidelying to sit: Mod assist, +2 for physical assistance   Sit to supine: Mod assist   General bed mobility comments: VCs for log roll technique (pt reported LBP at rest in bed), mod A to raise trunk; Mod A for BLEs back into bed. Pt sat EOB x ~5 minutes    Transfers                   General transfer comment: pt refused to attempt sit to stand despite much encouragement and education on benefits of mobility, so assisted her back to supine. when asked why she wouldn't attempt sit to stand she stated, "I just don't want to".    Ambulation/Gait                   Stairs             Wheelchair Mobility    Modified Rankin (Stroke Patients Only)       Balance Overall balance assessment: Needs assistance Sitting-balance support: Feet supported, No upper extremity supported Sitting balance-Leahy Scale: Fair                                      Cognition Arousal/Alertness: Awake/alert Behavior During Therapy: WFL for tasks assessed/performed Overall Cognitive Status: Within Functional Limits for tasks assessed                                 General Comments: Able to answer questions and follow commands.        Exercises  General Comments        Pertinent Vitals/Pain Pain Assessment Faces Pain Scale: Hurts little more Pain Location: back Pain Descriptors / Indicators: Sore, Discomfort, Grimacing Pain Intervention(s): Limited activity within patient's tolerance, Monitored during session, Repositioned    Home Living                          Prior Function            PT Goals (current goals can now be found in the care plan section) Acute Rehab PT Goals Patient Stated Goal: back to walking PT Goal Formulation: With patient Time For Goal Achievement: 04/09/22 Potential to Achieve Goals: Good Progress towards PT goals: Not progressing toward goals - comment (pt self limiting)     Frequency    Min 2X/week      PT Plan Current plan remains appropriate    Co-evaluation              AM-PAC PT "6 Clicks" Mobility   Outcome Measure  Help needed turning from your back to your side while in a flat bed without using bedrails?: A Little Help needed moving from lying on your back to sitting on the side of a flat bed without using bedrails?: A Lot Help needed moving to and from a bed to a chair (including a wheelchair)?: Total Help needed standing up from a chair using your arms (e.g., wheelchair or bedside chair)?: Total Help needed to walk in hospital room?: Total Help needed climbing 3-5 steps with a railing? : Total 6 Click Score: 9    End of Session   Activity Tolerance: Other (comment) (pt self limiting) Patient left: in bed;with bed alarm set;with call bell/phone within reach Nurse Communication: Mobility status PT Visit Diagnosis: Other abnormalities of gait and mobility (R26.89);Muscle weakness (generalized) (M62.81)     Time: 9935-7017 PT Time Calculation (min) (ACUTE ONLY): 18 min  Charges:  $Therapeutic Activity: 8-22 mins                     Blondell Reveal Kistler PT 03/29/2022  Acute Rehabilitation Services  Office [phone]

## 2022-03-29 NOTE — Plan of Care (Signed)
  Problem: Education: Goal: Knowledge of General Education information will improve Description Including pain rating scale, medication(s)/side effects and non-pharmacologic comfort measures Outcome: Progressing   Problem: Health Behavior/Discharge Planning: Goal: Ability to manage health-related needs will improve Outcome: Progressing   

## 2022-03-30 DIAGNOSIS — F10930 Alcohol use, unspecified with withdrawal, uncomplicated: Secondary | ICD-10-CM | POA: Diagnosis not present

## 2022-03-30 NOTE — Progress Notes (Signed)
PROGRESS NOTE  Ashley Bradley  DOB: Jan 07, 1951  PCP: Kristen Loader, FNP [id number]  DOA: 03/22/2022  LOS: 8 days  Hospital Day: 9  Brief narrative: Ashley Bradley is a 71 y.o. female with PMH significant for cerebral palsy with seizure disorder, alcohol abuse with alcohol withdrawal seizure, depression, HTN, HLD, prolonged QTc, history of C. Difficile 90-19, patient presented to the ER with wanting to detox from alcohol. Her son passed away the end of 01-05-23 and she states she has been in severe depression and has been in bed since then with continuous drinking daily.  She states that she drinks approximately 10-12 Coors light per day.  She orders beer via delivery.  Her husband is sober and does not drink in the house. She also has not showered since 05-Jan-2023.  She states that she has stayed in bed since then and wears diapers which her husband changes. She has been compliant on her Prozac and Keppra.  She states it has been about 2 to 3 years since her last seizure which was associated with alcohol withdrawal at that time.  In the ED, she was tachycardic to 130s CBC unremarkable.  BMP with potassium low at 3.7, normal renal function On exam, she was noted to have diffuse rash all over her body which is worse on her back. She has no lesions in her mouth.   The rash was discussed with [facility] dermatology however without adequate examination in person, they were unable to provide significant recommendations.  They did not recommend urgent transfer.  Patient also did not wish to transfer.  Bed availability of course also unavailable beds at [facility].   Patient was admitted to [facility] for alcohol withdrawal and ongoing monitoring of rash as well as suspicion of severe depression.  She did not endorse any suicidal ideation.   Subjective: Patient was seen and examined this morning.   Alert, awake, oriented x3.  Taking her request.  No new issues.   Husband not at bedside today.  Assessment and  plan: Chronic alcoholism  alcohol withdrawal  Patient apparently been drinking heavily since her son expired in 01/05/2023.  She presented to the ED wanting to be detoxed. Last drink in the morning of 03/22/2022 Was monitored for withdrawal symptoms with CIWA protocol.  Treated with a scheduled Valium 5 mg twice daily as well as as needed Ativan.  She did not have significant withdrawal symptoms. Valium and Ativan has been stopped.  Mental status much better. Continue folate, multivitamin, thiamine  Hypokalemia Potassium level low at 3 this morning.  Replacement given. Recent Labs  Lab 03/24/22 0905 03/25/22 0419 03/26/22 0424 03/27/22 0621 03/28/22 0403  K 2.8* 3.8 3.5 3.0* 3.0*  MG 1.7 2.2 1.9 1.9 2.0  PHOS  --  2.6 2.9 3.3 3.7    History of seizure disorder, cerebral palsy Patient reports that her last withdrawal seizure was about 2 to 3 years ago. Continue Keppra  Dermatitis Likely because of bedridden status.  Apparently, she has not showered or been out of bed since end of 2023-01-05 and wearing diapers for voiding and have BMs ER did discuss with [facility] derm (Dr. Denese Killings) but no pics able to be reviewed; their initial rec was to consider triamcinolone or silvadene cream. Much of her rash looks like seborrheic and/or irritant contact dermatitis especially since it's worse on her back which if she's been laying in bed for 3 months would seem expected; scalp also has seborrheic appearance and very neglected  hair Started on ketoconazole shampoo every other day to hair/scalp Please bathe patient daily Continue triamcinolone 0.025% (low potency) cream and ketoconazole cream to body affected with rash (chest, back, arms, legs, etc).  Also added calamine lotion.   Major depression Reports compliance to Prozac.   Denies suicidal ideation but patient grossly depressed since her son's death the end of 12/22/22.  She has been laying in bed ordering delivery of beer and has not showered since Dec 22, 2022  either. Continue Prozac Psychiatry consult offered.  Patient refused and wanted to keep the follow-up plan with her counselor as before.  Sinus tachycardia Tachycardia improved with control of withdrawal symptoms with benzodiazepine.     Essential hypertension Losartan remain on hold.  Blood pressure normal.  Resume when blood pressure rises.  Physical deconditioning bed bound since end of 12-22-22 PT eval obtained.  SNF recommended.   Spastic hemiplegic cerebral palsy  Right lower extremity noted with increased tone and decreased flexibility Continue Keppra  Goals of care   Code Status: Full Code    Mobility: PT eval ordered  Skin assessment: Generalized rashes noted.    Nutritional status:  Body mass index is 24.07 kg/m.          Diet:  Diet Order             Diet regular Room service appropriate? No; Fluid consistency: Thin  Diet effective now                   DVT prophylaxis:  enoxaparin (LOVENOX) injection 40 mg Start: 03/22/22 2215   Antimicrobials: Topical steroid, oral fluconazole Fluid: Not IV fluid Consultants: None Family Communication: None at bedside   Status is: Inpatient  Continue inhospital care because: Pending SNF Level of care: Med-Surg   Dispo: The patient is from: Home              Anticipated d/c is to: Pending SNF              Patient currently is medically stable to d/c.   Difficult to place patient No     Infusions:     Scheduled Meds:  calamine  1 Application Topical TID   enoxaparin (LOVENOX) injection  40 mg Subcutaneous Q24H   fluconazole  100 mg Oral Daily   FLUoxetine  40 mg Oral q morning   folic acid  1 mg Oral Daily   hydrocortisone cream   Topical BID   ketoconazole   Topical QODAY   levETIRAcetam  500 mg Oral BID   multivitamin with minerals  1 tablet Oral Daily   saccharomyces boulardii  500 mg Oral QPM   sodium chloride flush  3 mL Intravenous Q12H   thiamine  100 mg Oral Daily   Or   thiamine   100 mg Intravenous Daily    PRN meds: acetaminophen **OR** acetaminophen, oxyCODONE, polyethylene glycol   Antimicrobials: Anti-infectives (From admission, onward)    Start     Dose/Rate Route Frequency Ordered Stop   03/26/22 1300  fluconazole (DIFLUCAN) tablet 100 mg        100 mg Oral Daily 03/26/22 1203         Objective: Vitals:   03/30/22 0438 03/30/22 1247  BP: 120/73 115/62  Pulse: 86 92  Resp: 17 20  Temp: 98.7 F (37.1 C) 100.2 F (37.9 C)  SpO2: 93% 94%    Intake/Output Summary (Last 24 hours) at 03/30/2022 1544 Last data filed at 03/30/2022 0900 Gross per 24  hour  Intake 243 ml  Output 6550 ml  Net -6307 ml    Filed Weights   03/22/22 2118  Weight: 65.6 kg   Weight change:  Body mass index is 24.07 kg/m.   Physical Exam: General exam: Pleasant elderly Caucasian female.  Not in physical distress. Skin: Generalized very pruritic skin rash HEENT: Atraumatic, normocephalic, no obvious bleeding Lungs: Clear to auscultation bilaterally CVS: Regular rate and rhythm, no murmur GI/Abd soft, nontender, nondistended, bowel sound present CNS: Alert, awake, oriented x3.  Improved tremors in both sides Psychiatry: Mood appropriate Extremities: No pedal edema, no calf tenderness  Data Review: I have personally reviewed the laboratory data and studies available.  F/u labs ordered Unresulted Labs (From admission, onward)     Start     Ordered   03/31/22 0500  CBC with Differential/Platelet  Tomorrow morning,   R       Question:  Specimen collection method  Answer:  Lab=Lab collect   03/30/22 0803   03/31/22 [id number]  Basic metabolic panel  Tomorrow morning,   R       Question:  Specimen collection method  Answer:  Lab=Lab collect   03/30/22 0803            Signed, Terrilee Croak, MD [facility] 03/30/2022

## 2022-03-30 NOTE — Evaluation (Signed)
Occupational Therapy Evaluation Patient Details Name: Ashley Bradley MRN: [id number] DOB: October 20, 1950 Today's Date: 03/30/2022   History of Present Illness Pt is a 71yo female presenting to [facility] ED on 9/19 via EMS reporting ETOH binge x6weeks with N/V/D and body rash; reporting lying in bed with husband changing diapers. 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   Clinical Impression   Treatment focused on therapeutic exercises to improve strength, endurance and prepare for further functional mobility. Patient max assist to transfer to edge of bed needing assistance for both Les and trunk. Patient exhibited improved sitting balance today compared to evaluation and did not need to prop with Ues. She exhibited improved posture and ability to hold her head up. She performed shoulder flexion reps in supine, scapular retractions at edge of bed. To work towards standing patient performed mini-squats with stedy x 5 (mod x 2 to power up even just barely off the bed but patient able to hold position a couple of seconds.). Patient refused to attempt to fully stand. Patient making progress - continue to recommend short term rehab.     Recommendations for follow up therapy are one component of a multi-disciplinary discharge planning process, led by the attending physician.  Recommendations may be updated based on patient status, additional functional criteria and insurance authorization.   Follow Up Recommendations  Skilled nursing-short term rehab (<3 hours/day)    Assistance Recommended at Discharge Frequent or constant Supervision/Assistance  Patient can return home with the following Two people to help with bathing/dressing/bathroom;Two people to help with walking and/or transfers;Assistance with cooking/housework;Direct supervision/assist for medications management;Assist for transportation;Help with stairs or ramp for entrance;Direct supervision/assist for financial  management;Assistance with feeding    Functional Status Assessment  Patient has had a recent decline in their functional status and demonstrates the ability to make significant improvements in function in a reasonable and predictable amount of time.  Equipment Recommendations  Other (comment) (defer)    Recommendations for Other Services       Precautions / Restrictions Restrictions Weight Bearing Restrictions: No      Mobility Bed Mobility Overal bed mobility: Needs Assistance Bed Mobility: Supine to Sit, Sit to Supine     Supine to sit: Max assist, HOB elevated Sit to supine: Mod assist, +2 for physical assistance        Transfers                          Balance                                           ADL either performed or assessed with clinical judgement   ADL                                               Vision         Perception     Praxis      Pertinent Vitals/Pain       Hand Dominance     Extremity/Trunk Assessment             Communication     Cognition  General Comments       Exercises Other Exercises Other Exercises: mini squats w//mod assist x 2 with stedy - x 5 (maintain hold 2-3 seconds) Other Exercises: shoulder flexion reps x 10 x 2 sets in supine Other Exercises: Scapular retractions with 3-5 second hold x 5   Shoulder Instructions      Home Living                                          Prior Functioning/Environment                          OT Problem List: Decreased strength;Decreased range of motion;Decreased activity tolerance;Impaired balance (sitting and/or standing);Decreased coordination;Decreased cognition;Decreased safety awareness;Decreased knowledge of use of DME or AE;Pain;Impaired UE functional use      OT Treatment/Interventions: Self-care/ADL  training;Therapeutic exercise;DME and/or AE instruction;Therapeutic activities;Balance training;Patient/family education;Neuromuscular education    OT Goals(Current goals can be found in the care plan section) Acute Rehab OT Goals Patient Stated Goal: get stronger OT Goal Formulation: With patient Time For Goal Achievement: 04/10/22 Potential to Achieve Goals: Good  OT Frequency: Min 2X/week    Co-evaluation              AM-PAC OT "6 Clicks" Daily Activity     Outcome Measure Help from another person eating meals?: A Little Help from another person taking care of personal grooming?: A Little Help from another person toileting, which includes using toliet, bedpan, or urinal?: Total Help from another person bathing (including washing, rinsing, drying)?: A Lot Help from another person to put on and taking off regular upper body clothing?: A Little Help from another person to put on and taking off regular lower body clothing?: Total 6 Click Score: 13   End of Session Equipment Utilized During Treatment: Other (comment) (stedy) Nurse Communication: Mobility status  Activity Tolerance: Patient tolerated treatment well Patient left: in bed;with call bell/phone within reach;with bed alarm set  OT Visit Diagnosis: Muscle weakness (generalized) (M62.81)                Time: 7106-2694 OT Time Calculation (min): 14 min Charges:  OT General Charges $OT Visit: 1 Visit OT Treatments $Therapeutic Exercise: 8-22 mins  Gustavo Lah, OTR/L Acute Care Rehab Services  Office [phone]   Lenward Chancellor 03/30/2022, 12:33 PM

## 2022-03-30 NOTE — Plan of Care (Signed)
  Problem: Education: Goal: Knowledge of General Education information will improve Description Including pain rating scale, medication(s)/side effects and non-pharmacologic comfort measures Outcome: Progressing   Problem: Health Behavior/Discharge Planning: Goal: Ability to manage health-related needs will improve Outcome: Progressing   

## 2022-03-31 DIAGNOSIS — F339 Major depressive disorder, recurrent, unspecified: Secondary | ICD-10-CM

## 2022-03-31 DIAGNOSIS — F10930 Alcohol use, unspecified with withdrawal, uncomplicated: Secondary | ICD-10-CM | POA: Diagnosis not present

## 2022-03-31 DIAGNOSIS — L309 Dermatitis, unspecified: Secondary | ICD-10-CM | POA: Diagnosis not present

## 2022-03-31 DIAGNOSIS — R5381 Other malaise: Secondary | ICD-10-CM | POA: Diagnosis not present

## 2022-03-31 LAB — CBC WITH DIFFERENTIAL/PLATELET
Abs Immature Granulocytes: 0.02 10*3/uL (ref 0.00–0.07)
Basophils Absolute: 0.1 10*3/uL (ref 0.0–0.1)
Basophils Relative: 2 %
Eosinophils Absolute: 0.3 10*3/uL (ref 0.0–0.5)
Eosinophils Relative: 5 %
HCT: 36 % (ref 36.0–46.0)
Hemoglobin: 11.8 g/dL — ABNORMAL LOW (ref 12.0–15.0)
Immature Granulocytes: 0 %
Lymphocytes Relative: 25 %
Lymphs Abs: 1.4 10*3/uL (ref 0.7–4.0)
MCH: 33 pg (ref 26.0–34.0)
MCHC: 32.8 g/dL (ref 30.0–36.0)
MCV: 100.6 fL — ABNORMAL HIGH (ref 80.0–100.0)
Monocytes Absolute: 0.8 10*3/uL (ref 0.1–1.0)
Monocytes Relative: 15 %
Neutro Abs: 2.9 10*3/uL (ref 1.7–7.7)
Neutrophils Relative %: 53 %
Platelets: 444 10*3/uL — ABNORMAL HIGH (ref 150–400)
RBC: 3.58 MIL/uL — ABNORMAL LOW (ref 3.87–5.11)
RDW: 13.2 % (ref 11.5–15.5)
WBC: 5.5 10*3/uL (ref 4.0–10.5)
nRBC: 0 % (ref 0.0–0.2)

## 2022-03-31 LAB — BASIC METABOLIC PANEL
Anion gap: 7 (ref 5–15)
BUN: 12 mg/dL (ref 8–23)
CO2: 26 mmol/L (ref 22–32)
Calcium: 8.7 mg/dL — ABNORMAL LOW (ref 8.9–10.3)
Chloride: 103 mmol/L (ref 98–111)
Creatinine, Ser: 0.41 mg/dL — ABNORMAL LOW (ref 0.44–1.00)
GFR, Estimated: >60 mL/min (ref >60)
Glucose, Bld: 101 mg/dL — ABNORMAL HIGH (ref 70–99)
Potassium: 4.2 mmol/L (ref 3.5–5.1)
Sodium: 136 mmol/L (ref 135–145)

## 2022-03-31 NOTE — Progress Notes (Signed)
Progress Note    TANA TREFRY   OHY:[id number]  DOB: 1951/01/19  DOA: 03/22/2022     9 PCP: Kristen Loader, FNP  Initial CC: detox from etoh  Hospital Course: Ms. Ziesmer is a 71 yo female with PMH cerebral palsy with seizure disorder, alcohol abuse history with alcohol withdrawal seizure, depression, HTN, HLD, prolonged QTc, history of C. difficile who presented to the ER with wanting to detox from alcohol. Her son passed away the end of 12-28-22 and she states she has been in severe depression and has been in bed since then with continuous drinking daily.  She states that she drinks approximately 10-12 Coors light per day.  She orders beer via delivery.  Her husband is sober and does not drink in the house. She also has not showered since 2022-12-28.  She states that she has stayed in bed since then and wears diapers which her husband changes. She has been compliant on her Prozac and Keppra.  She states it has been about 2 to 3 years since her last seizure which was associated with alcohol withdrawal at that time.  She has a diffuse rash which is worse on her back.  The rash also involves her chest, arms, and legs.  She has no lesions in her mouth.  She does have a tooth which she states chipped recently and she feels may have possible infection. The rash was discussed with [facility] dermatology however without adequate examination in person, they were unable to provide significant recommendations.  They however did not recommend need for urgent transfer.  Patient admitted for support for alcohol withdrawal and ongoing monitoring of rash along with consultation with psychiatry due to severe depression.  She endorsed no suicidal ideation.  Interval History:  Patient known to me from admission.  Mood was improved from last time I saw her as well as energy. Plan remains for going to rehab at time of discharge.  Rash has also improved. I strongly encouraged her to continue working with physical and  Occupational Therapy.  Assessment and Plan: Alcohol withdrawal (HCC)-resolved as of 03/31/2022 - Last drink 6 AM on 03/22/2022 -Last withdrawal seizure approximately 2 to 3 years ago she reports.  She is on chronic Keppra possibly also in setting of history of cerebral palsy - etoh level negative; NH3 normal (20) -Minimal alcohol withdrawal symptoms during hospitalization - Continue folate, multivitamin, thiamine  Dermatitis - patient states no history of rash in general prior to being bed ridden; denies history of psoriasis or skin conditions in general. She has not showered or been out of bed since end of 12-28-2022 and wearing diapers for voiding and have BMs - ER did discuss with [facility] derm (Dr. Denese Killings) but no pics able to be reviewed; their initial rec was to consider triamcinolone or silvadene cream - much of her rash looks like seborrheic and/or irritant contact dermatitis especially since it's worse on her back which if she's been laying in bed for 3 months would seem expected; scalp also has seborrheic appearance and very neglected hair - ketoconazole shampoo every other day to hair/scalp - bathe patient DAILY -Continue fluconazole, calamine lotion, ketoconazole shampoo  Major depression - Compliant with Prozac she states -Denies suicidal ideation but patient grossly depressed since her son's death the end of Dec 28, 2022.  She has been laying in bed ordering delivery of beer and has not showered since 2022/12/28 either -Continue Prozac -patient declined psychiatry consult with plans for following up outpatient with her  counselor  Broken tooth - left mandibular premolar tooth noted with chipping and poor dentition in general - no systemic signs of infection and no purulent drainage/bad taste in mouth - hold off on abx for now, but can initiate if develops further signs/symptoms - needs outpatient followup with dentist for extractions  Physical deconditioning - bed bound since end of June - SNF  recommended -Patient encouraged to continue working with PT/OT  Essential hypertension - Hold losartan in setting of soft blood pressure  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HCC) - Right lower extremity noted with increased tone and decreased flexibility - Continue Keppra  Seizure disorder (HCC) - see cerebral palsy - Continue Keppra - Keppra level 13 (LLN)  Diarrhea-resolved as of 03/31/2022 - Likely alcohol related   Old records reviewed in assessment of this patient  Antimicrobials:   DVT prophylaxis:  enoxaparin (LOVENOX) injection 40 mg Start: 03/22/22 2215   Code Status:   Code Status: Full Code  Mobility Assessment (last 72 hours)     Mobility Assessment     Row Name 03/31/22 0855 03/31/22 0813 03/30/22 2025 03/30/22 1226 03/30/22 0843   Does patient have an order for bedrest or is patient medically unstable No - Continue assessment No - Continue assessment No - Continue assessment -- No - Continue assessment   What is the highest level of mobility based on the progressive mobility assessment? Level 1 (Bedfast) - Unable to balance while sitting on edge of bed Level 1 (Bedfast) - Unable to balance while sitting on edge of bed -- Level 2 (Chairfast) - Balance while sitting on edge of bed and cannot stand Level 2 (Chairfast) - Balance while sitting on edge of bed and cannot stand   Is the above level different from baseline mobility prior to current illness? Yes - Recommend PT order -- -- -- Yes - Recommend PT order    Row Name 03/29/22 2058 03/29/22 1327 03/29/22 0825 03/29/22 0745 03/28/22 1934   Does patient have an order for bedrest or is patient medically unstable No - Continue assessment -- No - Continue assessment No - Continue assessment No - Continue assessment   What is the highest level of mobility based on the progressive mobility assessment? -- Level 2 (Chairfast) - Balance while sitting on edge of bed and cannot stand Level 2 (Chairfast) - Balance while  sitting on edge of bed and cannot stand Level 2 (Chairfast) - Balance while sitting on edge of bed and cannot stand Level 2 (Chairfast) - Balance while sitting on edge of bed and cannot stand   Is the above level different from baseline mobility prior to current illness? -- -- Yes - Recommend PT order Yes - Recommend PT order Yes - Recommend PT order            Barriers to discharge:  Disposition Plan: SNF Status is: Inpatient  Objective: Blood pressure 124/81, pulse 86, temperature 98.2 F (36.8 C), temperature source Oral, resp. rate 16, height 5\' 5"  (1.651 m), weight 65.6 kg, SpO2 95 %.  Examination:  Physical Exam Constitutional:      Comments: Awake, alert, and cooperative resting in bed in no distress  HENT:     Head: Normocephalic and atraumatic.     Mouth/Throat:     Mouth: Mucous membranes are moist.     Comments: Chipped tooth noted left mandibular premolar area; no mucosal lesions appreciated Eyes:     Extraocular Movements: Extraocular movements intact.  Cardiovascular:     Rate  and Rhythm: Normal rate and regular rhythm.  Pulmonary:     Effort: Pulmonary effort is normal.     Breath sounds: Normal breath sounds.  Abdominal:     General: Bowel sounds are normal. There is no distension.     Palpations: Abdomen is soft.     Tenderness: There is no abdominal tenderness.  Musculoskeletal:        General: No swelling or tenderness.     Cervical back: Normal range of motion and neck supple.     Comments: Stiff right lower extremity, decreased ROM in right ankle chronic in setting of cerebral palsy history  Skin:    Comments: Improved rash throughout body appearing erythematous, scaly, patchy (worse in back, next worse in chest). Then scattered lesions on arms and legs  Neurological:     General: No focal deficit present.     Mental Status: She is oriented to person, place, and time.  Psychiatric:     Comments: Depressed mood but improved       Consultants:     Procedures:    Data Reviewed: Results for orders placed or performed during the hospital encounter of 03/22/22 (from the past 24 hour(s))  CBC with Differential/Platelet     Status: Abnormal   Collection Time: 03/31/22  4:13 AM  Result Value Ref Range   WBC 5.5 4.0 - 10.5 K/uL   RBC 3.58 (L) 3.87 - 5.11 MIL/uL   Hemoglobin 11.8 (L) 12.0 - 15.0 g/dL   HCT 72.5 36.6 - 44.0 %   MCV 100.6 (H) 80.0 - 100.0 fL   MCH 33.0 26.0 - 34.0 pg   MCHC 32.8 30.0 - 36.0 g/dL   RDW 34.7 42.5 - 95.6 %   Platelets 444 (H) 150 - 400 K/uL   nRBC 0.0 0.0 - 0.2 %   Neutrophils Relative % 53 %   Neutro Abs 2.9 1.7 - 7.7 K/uL   Lymphocytes Relative 25 %   Lymphs Abs 1.4 0.7 - 4.0 K/uL   Monocytes Relative 15 %   Monocytes Absolute 0.8 0.1 - 1.0 K/uL   Eosinophils Relative 5 %   Eosinophils Absolute 0.3 0.0 - 0.5 K/uL   Basophils Relative 2 %   Basophils Absolute 0.1 0.0 - 0.1 K/uL   Immature Granulocytes 0 %   Abs Immature Granulocytes 0.02 0.00 - 0.07 K/uL  Basic metabolic panel     Status: Abnormal   Collection Time: 03/31/22  4:13 AM  Result Value Ref Range   Sodium 136 135 - 145 mmol/L   Potassium 4.2 3.5 - 5.1 mmol/L   Chloride 103 98 - 111 mmol/L   CO2 26 22 - 32 mmol/L   Glucose, Bld 101 (H) 70 - 99 mg/dL   BUN 12 8 - 23 mg/dL   Creatinine, Ser 3.87 (L) 0.44 - 1.00 mg/dL   Calcium 8.7 (L) 8.9 - 10.3 mg/dL   GFR, Estimated >56 >43 mL/min   Anion gap 7 5 - 15    I have Reviewed nursing notes, Vitals, and Lab results since pt's last encounter. Pertinent lab results : see above I have ordered test including BMP, CBC, Mg I have reviewed the last note from staff over past 24 hours I have discussed pt's care plan and test results with nursing staff, case manager   LOS: 9 days   Lewie Chamber, MD [facility] 03/31/2022, 4:17 PM

## 2022-04-01 DIAGNOSIS — R5381 Other malaise: Secondary | ICD-10-CM | POA: Diagnosis not present

## 2022-04-01 DIAGNOSIS — G802 Spastic hemiplegic cerebral palsy: Secondary | ICD-10-CM

## 2022-04-01 DIAGNOSIS — L309 Dermatitis, unspecified: Secondary | ICD-10-CM | POA: Diagnosis not present

## 2022-04-01 MED ORDER — HYDROXYZINE HCL 25 MG PO TABS
25.0000 mg | ORAL_TABLET | Freq: Two times a day (BID) | ORAL | Status: DC
  Administered 2022-04-01 (×2): 25 mg via ORAL
  Filled 2022-04-01 (×2): qty 1

## 2022-04-01 MED ORDER — TRIAMCINOLONE ACETONIDE 0.1 % EX CREA
TOPICAL_CREAM | Freq: Two times a day (BID) | CUTANEOUS | Status: DC
  Filled 2022-04-01: qty 454
  Filled 2022-04-01 (×7): qty 15

## 2022-04-01 NOTE — Progress Notes (Signed)
Progress Note    Ashley Bradley   UEA:[id number]  DOB: 1950-07-17  DOA: 03/22/2022     10 PCP: Ashley Pilon, FNP  Initial CC: detox from etoh  Hospital Course: [name] is a 71 yo female with PMH cerebral palsy with seizure disorder, alcohol abuse history with alcohol withdrawal seizure, depression, HTN, HLD, prolonged QTc, history of C. difficile who presented to the ER with wanting to detox from alcohol. Her son passed away the end of Jan 08, 2023 and she states she has been in severe depression and has been in bed since then with continuous drinking daily.  She states that she drinks approximately 10-12 Coors light per day.  She orders beer via delivery.  Her husband is sober and does not drink in the house. She also has not showered since 01/08/2023.  She states that she has stayed in bed since then and wears diapers which her husband changes. She has been compliant on her Prozac and Keppra.  She states it has been about 2 to 3 years since her last seizure which was associated with alcohol withdrawal at that time.  She has a diffuse rash which is worse on her back.  The rash also involves her chest, arms, and legs.  She has no lesions in her mouth.  She does have a tooth which she states chipped recently and she feels may have possible infection. The rash was discussed with [facility] dermatology however without adequate examination in person, they were unable to provide significant recommendations.  They however did not recommend need for urgent transfer.  Patient admitted for support for alcohol withdrawal and ongoing monitoring of rash along with consultation with psychiatry due to severe depression.  She endorsed no suicidal ideation.  Interval History:  No events overnight.  Resting in bed when seen this morning was complaining of some increased itching. Great difficulty working with physical therapy today although she at least attempted but was unable to even sit on edge of  bed.  Assessment and Plan: Alcohol withdrawal (HCC)-resolved as of 03/31/2022 - Last drink 6 AM on 03/22/2022 -Last withdrawal seizure approximately 2 to 3 years ago she reports.  She is on chronic Keppra possibly also in setting of history of cerebral palsy - etoh level negative; NH3 normal (20) -Minimal alcohol withdrawal symptoms during hospitalization - Continue folate, multivitamin, thiamine  Dermatitis - patient states no history of rash in general prior to being bed ridden; denies history of psoriasis or skin conditions in general. She has not showered or been out of bed since end of 08-Jan-2023 and wearing diapers for voiding and have BMs - ER did discuss with [facility] derm (Dr. Nolon Bradley) but no pics able to be reviewed; their initial rec was to consider triamcinolone or silvadene cream - much of her rash looks like seborrheic and/or irritant contact dermatitis especially since it's worse on her back which if she's been laying in bed for 3 months would seem expected; scalp also has seborrheic appearance and very neglected hair - ketoconazole shampoo every other day to hair/scalp - bathe patient DAILY -Continue fluconazole, calamine lotion, ketoconazole shampoo  Major depression - Compliant with Prozac she states -Denies suicidal ideation but patient grossly depressed since her son's death the end of 2023-01-08.  She has been laying in bed ordering delivery of beer and has not showered since 08-Jan-2023 either -Continue Prozac -patient declined psychiatry consult with plans for following up outpatient with her counselor  Broken tooth - left mandibular premolar  tooth noted with chipping and poor dentition in general - no systemic signs of infection and no purulent drainage/bad taste in mouth - hold off on abx for now, but can initiate if develops further signs/symptoms - needs outpatient followup with dentist for extractions  Physical deconditioning - bed bound since end of June - SNF  recommended -Patient encouraged to continue working with PT/OT  Essential hypertension - Hold losartan in setting of soft blood pressure  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[place]) - Right lower extremity noted with increased tone and decreased flexibility - Continue Keppra  Seizure disorder ([place]) - see cerebral palsy - Continue Keppra - Keppra level 13 (LLN)  Diarrhea-resolved as of 03/31/2022 - Likely alcohol related   Old records reviewed in assessment of this patient  Antimicrobials:   DVT prophylaxis:  enoxaparin (LOVENOX) injection 40 mg Start: 03/22/22 2215   Code Status:   Code Status: Full Code  Mobility Assessment (last 72 hours)     Mobility Assessment     Row Name 04/01/22 1601 04/01/22 0934 03/31/22 0855 03/31/22 0813 03/30/22 2025   Does patient have an order for bedrest or is patient medically unstable -- No - Continue assessment No - Continue assessment No - Continue assessment No - Continue assessment   What is the highest level of mobility based on the progressive mobility assessment? Level 2 (Chairfast) - Balance while sitting on edge of bed and cannot stand Level 2 (Chairfast) - Balance while sitting on edge of bed and cannot stand Level 1 (Bedfast) - Unable to balance while sitting on edge of bed Level 1 (Bedfast) - Unable to balance while sitting on edge of bed --   Is the above level different from baseline mobility prior to current illness? -- Yes - Recommend PT order Yes - Recommend PT order -- --    Row Name 03/30/22 1226 03/30/22 0843 03/29/22 2058       Does patient have an order for bedrest or is patient medically unstable -- No - Continue assessment No - Continue assessment     What is the highest level of mobility based on the progressive mobility assessment? Level 2 (Chairfast) - Balance while sitting on edge of bed and cannot stand Level 2 (Chairfast) - Balance while sitting on edge of bed and cannot stand --     Is the above level different  from baseline mobility prior to current illness? -- Yes - Recommend PT order --              Barriers to discharge:  Disposition Plan: SNF Status is: Inpatient  Objective: Blood pressure 107/61, pulse 89, temperature 98 F (36.7 C), temperature source Oral, resp. rate 18, height 5\' 5"  (1.651 m), weight 65.6 kg, SpO2 97 %.  Examination:  Physical Exam Constitutional:      Comments: Awake, alert, and cooperative resting in bed in no distress  HENT:     Head: Normocephalic and atraumatic.     Mouth/Throat:     Mouth: Mucous membranes are moist.     Comments: Chipped tooth noted left mandibular premolar area; no mucosal lesions appreciated Eyes:     Extraocular Movements: Extraocular movements intact.  Cardiovascular:     Rate and Rhythm: Normal rate and regular rhythm.  Pulmonary:     Effort: Pulmonary effort is normal.     Breath sounds: Normal breath sounds.  Abdominal:     General: Bowel sounds are normal. There is no distension.     Palpations: Abdomen  is soft.     Tenderness: There is no abdominal tenderness.  Musculoskeletal:        General: No swelling or tenderness.     Cervical back: Normal range of motion and neck supple.     Comments: Stiff right lower extremity, decreased ROM in right ankle chronic in setting of cerebral palsy history  Skin:    Comments: Improved rash throughout body appearing erythematous, scaly, patchy (worse in back, next worse in chest). Then scattered lesions on arms and legs  Neurological:     General: No focal deficit present.     Mental Status: She is oriented to person, place, and time.  Psychiatric:     Comments: Depressed mood but improved       Consultants:    Procedures:    Data Reviewed: No results found for this or any previous visit (from the past 24 hour(s)).   I have Reviewed nursing notes, Vitals, and Lab results since pt's last encounter. Pertinent lab results : see above I have ordered test including BMP, CBC,  Mg I have reviewed the last note from staff over past 24 hours I have discussed pt's care plan and test results with nursing staff, case manager   LOS: 10 days   Lewie Chamber, MD [facility] 04/01/2022, 4:09 PM

## 2022-04-01 NOTE — TOC Progression Note (Signed)
Transition of Care [facility]) - Progression Note    Patient Details  Name: Ashley Bradley MRN: [id number] Date of Birth: 07-Jan-1951  Transition of Care [facility] CM/SW [place], RN Phone Number: 04/01/2022, 4:56 PM  Clinical Narrative:   Presented patient with SNF bed offers ([email or web address].gov list), PASRR pending, awaiting choice.   TOC will continue to follow  Expected Discharge Plan: [place] Barriers to Discharge: Continued Medical Work up  Expected Discharge Plan and Services Expected Discharge Plan: [place]   Discharge Planning Services: CM Consult                                           Social Determinants of Health (SDOH) Interventions    Readmission Risk Interventions     No data to display

## 2022-04-01 NOTE — Progress Notes (Signed)
Physical Therapy Treatment Patient Details Name: Ashley Bradley MRN: [id number] DOB: [birth year] Today's Date: 04/01/2022   History of Present Illness Pt is a 71yo female presenting to [facility] ED on 9/19 via EMS reporting ETOH binge x6weeks with N/V/D and body rash; reporting lying in bed with husband changing diapers. 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    PT Comments    General Comments: very pleasant Lady, Retired Marine scientist. Multiple hospital admission.  Familiar from prior admit.  Was unable to get pt OOB.  General bed mobility comments: great difficulty self assisting from sidlying to sitting EOB due to R hemiparesis from her CP and overall paraparesis from Alcohol withdrawl.  Pt was UNABLE to even sit EOB.  Pt curled up in fetal position with hips/knees flexed as well as severe RIGHT lean.  Pt was unable to achieve a safe/full upright position. Had to place a [facility] facing backward toward pt just so she had something to hold to for support. General transfer comment: unable to attempt due to poor sitting balance. Also assisted with peri care as pt unknowing to her had a BM.  Then positioned sidelying to comfort.    Recommendations for follow up therapy are one component of a multi-disciplinary discharge planning process, led by the attending physician.  Recommendations may be updated based on patient status, additional functional criteria and insurance authorization.  Follow Up Recommendations  Skilled nursing-short term rehab (<3 hours/day) Can patient physically be transported by private vehicle: No   Assistance Recommended at Discharge Frequent or constant Supervision/Assistance  Patient can return home with the following Two people to help with walking and/or transfers;A lot of help with bathing/dressing/bathroom;Assist for transportation;Help with stairs or ramp for entrance;Assistance with cooking/housework   Equipment Recommendations  None recommended by PT     Recommendations for Other Services       Precautions / Restrictions Precautions Precautions: Fall Precaution Comments: R hemiparesis secondary to CP Restrictions Weight Bearing Restrictions: No     Mobility  Bed Mobility Overal bed mobility: Needs Assistance Bed Mobility: Rolling, Sidelying to Sit, Sit to Sidelying Rolling: Min guard, Min assist Sidelying to sit: Mod assist, +2 for physical assistance, Max assist   Sit to supine: Max assist, Total assist, +2 for physical assistance   General bed mobility comments: great difficulty self assisting from sidlying to sitting EOB due to R hemiparesis from her CP and overall paraparesis from Alcohol withdrawl.  Pt was UNABLE to even sit EOB.  Pt curled up in fetal position with hips/knees flexed as well as severe RIGHT lean.  Pt was unable to achieve a safe/full upright position. Had to place a [facility] facing backward toward pt just so she had something to hold to for support.    Transfers                   General transfer comment: unable to attempt due to poor sitting balance.    Ambulation/Gait                   Stairs             Wheelchair Mobility    Modified Rankin (Stroke Patients Only)       Balance  Cognition Arousal/Alertness: Awake/alert Behavior During Therapy: WFL for tasks assessed/performed Overall Cognitive Status: Within Functional Limits for tasks assessed                                 General Comments: very pleasant Lady, Retired Engineer, civil (consulting).        Exercises      General Comments        Pertinent Vitals/Pain Pain Assessment Pain Assessment: Faces Pain Location: back (rash) Pain Descriptors / Indicators: Sore, Discomfort, Grimacing Pain Intervention(s): Monitored during session, Repositioned    Home Living                          Prior Function            PT Goals (current goals  can now be found in the care plan section) Progress towards PT goals: Progressing toward goals    Frequency    Min 2X/week      PT Plan Current plan remains appropriate    Co-evaluation              AM-PAC PT "6 Clicks" Mobility   Outcome Measure  Help needed turning from your back to your side while in a flat bed without using bedrails?: A Lot Help needed moving from lying on your back to sitting on the side of a flat bed without using bedrails?: A Lot Help needed moving to and from a bed to a chair (including a wheelchair)?: A Lot Help needed standing up from a chair using your arms (e.g., wheelchair or bedside chair)?: Total Help needed to walk in hospital room?: Total Help needed climbing 3-5 steps with a railing? : Total 6 Click Score: 9    End of Session Equipment Utilized During Treatment: Gait belt Activity Tolerance: Patient limited by fatigue Patient left: in bed;with bed alarm set;with call bell/phone within reach Nurse Communication: Mobility status PT Visit Diagnosis: Other abnormalities of gait and mobility (R26.89);Muscle weakness (generalized) (M62.81)     Time: 6295-2841 PT Time Calculation (min) (ACUTE ONLY): 18 min  Charges:  $Therapeutic Activity: 8-22 mins                     Felecia Shelling  PTA Acute  Rehabilitation Services Office M-F          [phone] Weekend pager [phone]

## 2022-04-02 DIAGNOSIS — L309 Dermatitis, unspecified: Secondary | ICD-10-CM | POA: Diagnosis not present

## 2022-04-02 DIAGNOSIS — F10930 Alcohol use, unspecified with withdrawal, uncomplicated: Secondary | ICD-10-CM | POA: Diagnosis not present

## 2022-04-02 DIAGNOSIS — R5381 Other malaise: Secondary | ICD-10-CM | POA: Diagnosis not present

## 2022-04-02 MED ORDER — HYDROXYZINE HCL 25 MG PO TABS
25.0000 mg | ORAL_TABLET | Freq: Two times a day (BID) | ORAL | Status: DC | PRN
  Administered 2022-04-02 – 2022-04-09 (×3): 25 mg via ORAL
  Filled 2022-04-02 (×6): qty 1

## 2022-04-02 NOTE — Plan of Care (Signed)
  Problem: Coping: Goal: Level of anxiety will decrease Outcome: Progressing   Problem: Skin Integrity: Goal: Risk for impaired skin integrity will decrease Outcome: Progressing   

## 2022-04-02 NOTE — Progress Notes (Signed)
Progress Note    Ashley Bradley   ZOX:[id number]  DOB: 1950/08/30  DOA: 03/22/2022     11 PCP: Soundra Pilon, FNP  Initial CC: detox from etoh  Hospital Course: Ashley Bradley is a 71 yo female with PMH cerebral palsy with seizure disorder, alcohol abuse history with alcohol withdrawal seizure, depression, HTN, HLD, prolonged QTc, history of C. difficile who presented to the ER with wanting to detox from alcohol. Her son passed away the end of 08-Jan-2023 and she states she has been in severe depression and has been in bed since then with continuous drinking daily.  She states that she drinks approximately 10-12 Coors light per day.  She orders beer via delivery.  Her husband is sober and does not drink in the house. She also has not showered since 08-Jan-2023.  She states that she has stayed in bed since then and wears diapers which her husband changes. She has been compliant on her Prozac and Keppra.  She states it has been about 2 to 3 years since her last seizure which was associated with alcohol withdrawal at that time.  She has a diffuse rash which is worse on her back.  The rash also involves her chest, arms, and legs.  She has no lesions in her mouth.  She does have a tooth which she states chipped recently and she feels may have possible infection. The rash was discussed with [facility] dermatology however without adequate examination in person, they were unable to provide significant recommendations.  They however did not recommend need for urgent transfer.  Patient admitted for support for alcohol withdrawal and ongoing monitoring of rash along with consultation with psychiatry due to severe depression.  She endorsed no suicidal ideation.  Interval History:  No events overnight.  Itching has improved today.  Hydroxyzine does make her sleepy so she is trying to limit use. Changing it to PRN today from scheduled.   Assessment and Plan: Alcohol withdrawal (HCC)-resolved as of 03/31/2022 - Last  drink 6 AM on 03/22/2022 -Last withdrawal seizure approximately 2 to 3 years ago she reports.  She is on chronic Keppra possibly also in setting of history of cerebral palsy - etoh level negative; NH3 normal (20) -Minimal alcohol withdrawal symptoms during hospitalization - Continue folate, multivitamin, thiamine  Dermatitis - patient states no history of rash in general prior to being bed ridden; denies history of psoriasis or skin conditions in general. She has not showered or been out of bed since end of 01/08/2023 and wearing diapers for voiding and have BMs - ER did discuss with [facility] derm (Dr. Nolon Bussing) but no pics able to be reviewed; their initial rec was to consider triamcinolone or silvadene cream - much of her rash looks like seborrheic and/or irritant contact dermatitis especially since it's worse on her back which if she's been laying in bed for 3 months would seem expected; scalp also has seborrheic appearance and very neglected hair - ketoconazole shampoo every other day to hair/scalp - bathe patient DAILY -Continue fluconazole, calamine lotion, ketoconazole shampoo  Major depression - Compliant with Prozac she states -Denies suicidal ideation but patient grossly depressed since her son's death the end of 2023-01-08.  She has been laying in bed ordering delivery of beer and has not showered since 01-08-23 either -Continue Prozac -patient declined psychiatry consult with plans for following up outpatient with her counselor  Broken tooth - left mandibular premolar tooth noted with chipping and poor dentition in general -  no systemic signs of infection and no purulent drainage/bad taste in mouth - hold off on abx for now, but can initiate if develops further signs/symptoms - needs outpatient followup with dentist for extractions  Physical deconditioning - bed bound since end of June - SNF recommended -Patient encouraged to continue working with PT/OT  Essential hypertension - Hold losartan  in setting of soft blood pressure  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[place]) - Right lower extremity noted with increased tone and decreased flexibility - Continue Keppra  Seizure disorder ([place]) - see cerebral palsy - Continue Keppra - Keppra level 13 (LLN)  Diarrhea-resolved as of 03/31/2022 - Likely alcohol related   Old records reviewed in assessment of this patient  Antimicrobials:   DVT prophylaxis:  enoxaparin (LOVENOX) injection 40 mg Start: 03/22/22 2215   Code Status:   Code Status: Full Code  Mobility Assessment (last 72 hours)     Mobility Assessment     Row Name 04/02/22 0830 04/01/22 2204 04/01/22 1601 04/01/22 0934 03/31/22 0855   Does patient have an order for bedrest or is patient medically unstable No - Continue assessment No - Continue assessment -- No - Continue assessment No - Continue assessment   What is the highest level of mobility based on the progressive mobility assessment? Level 2 (Chairfast) - Balance while sitting on edge of bed and cannot stand Level 2 (Chairfast) - Balance while sitting on edge of bed and cannot stand Level 2 (Chairfast) - Balance while sitting on edge of bed and cannot stand Level 2 (Chairfast) - Balance while sitting on edge of bed and cannot stand Level 1 (Bedfast) - Unable to balance while sitting on edge of bed   Is the above level different from baseline mobility prior to current illness? Yes - Recommend PT order Yes - Recommend PT order -- Yes - Recommend PT order Yes - Recommend PT order    [place] Name 03/31/22 0813 03/30/22 2025         Does patient have an order for bedrest or is patient medically unstable No - Continue assessment No - Continue assessment      What is the highest level of mobility based on the progressive mobility assessment? Level 1 (Bedfast) - Unable to balance while sitting on edge of bed --               Barriers to discharge:  Disposition Plan: SNF Status is: Inpatient  Objective: Blood  pressure (!) 102/57, pulse 100, temperature 98.4 F (36.9 C), temperature source Oral, resp. rate 18, height 5\' 5"  (1.651 m), weight 65.6 kg, SpO2 96 %.  Examination:  Physical Exam Constitutional:      Comments: Awake, alert, and cooperative resting in bed in no distress  HENT:     Head: Normocephalic and atraumatic.     Mouth/Throat:     Mouth: Mucous membranes are moist.     Comments: Chipped tooth noted left mandibular premolar area; no mucosal lesions appreciated Eyes:     Extraocular Movements: Extraocular movements intact.  Cardiovascular:     Rate and Rhythm: Normal rate and regular rhythm.  Pulmonary:     Effort: Pulmonary effort is normal.     Breath sounds: Normal breath sounds.  Abdominal:     General: Bowel sounds are normal. There is no distension.     Palpations: Abdomen is soft.     Tenderness: There is no abdominal tenderness.  Musculoskeletal:        General: No swelling  or tenderness.     Cervical back: Normal range of motion and neck supple.     Comments: Stiff right lower extremity, decreased ROM in right ankle chronic in setting of cerebral palsy history  Skin:    Comments: Improved rash throughout body appearing erythematous, scaly, patchy (worse in back, next worse in chest). Then scattered lesions on arms and legs  Neurological:     General: No focal deficit present.     Mental Status: She is oriented to person, place, and time.  Psychiatric:     Comments: Depressed mood but improved       Consultants:    Procedures:    Data Reviewed: No results found for this or any previous visit (from the past 24 hour(s)).   I have Reviewed nursing notes, Vitals, and Lab results since pt's last encounter. Pertinent lab results : see above I have reviewed the last note from staff over past 24 hours I have discussed pt's care plan and test results with nursing staff, case manager   LOS: 11 days   Lewie Chamber, MD [facility] 04/02/2022, 3:26 PM

## 2022-04-02 NOTE — Progress Notes (Signed)
Physical Therapy Treatment Patient Details Name: Ashley Bradley MRN: [id number] DOB: 06/12/1951 Today's Date: 04/02/2022   History of Present Illness Pt is a 71yo female presenting to [facility] ED on 9/19 via EMS reporting ETOH binge x6weeks with N/V/D and body rash; reporting lying in bed with husband changing diapers. 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  Familiar from prior admit.  Pt stated after her 64 year old Son died in 2023/01/13, she "gave up".  She stated she stayed in bed and "drank herself to a frennzy".  Pt admits to "not walking" and "using briefs to pee in". Pt admits to "deep depression".  Today, shesharred she  wants to get better/get stronger. Assisted to EOB was difficult.  General bed mobility comments: great difficulty self assisting from sidlying to sitting EOB due to R hemiparesis from her CP and overall weakness/deconditioning.  Max assist/effort just to sit EOB.  Unable to self support so placed the [facility] BACKWARD in front of her so she could use the bar support in partial upright sitting posture.  Required assist to scoot such that L foot was placed on floor.  R LE remained in PF and knee flexed.  Required MAX assist to lay back down and posiiton to comfort. General transfer comment: attempted "Bear Hug" sit to stand however pt unable to clear hips off bed so performed X 5 Therapist Asissted sit to stands. Rec use of STEDY for next session sit to stands.  Rec MAXI MOVE for Nursing staff to use for OOB transfer.  Assisted pt back to bed RIGHT sidelying.   Pt lives home with Spouse but will need ST Rehab prior to safely returning.    Recommendations for follow up therapy are one component of a multi-disciplinary discharge planning process, led by the attending physician.  Recommendations may be updated based on patient status, additional functional criteria and insurance  authorization.  Follow Up Recommendations  Skilled nursing-short term rehab (<3 hours/day) Can patient physically be transported by private vehicle: No   Assistance Recommended at Discharge Frequent or constant Supervision/Assistance  Patient can return home with the following Two people to help with walking and/or transfers;A lot of help with bathing/dressing/bathroom;Assist for transportation;Help with stairs or ramp for entrance;Assistance with cooking/housework   Equipment Recommendations  None recommended by PT    Recommendations for Other Services       Precautions / Restrictions Precautions Precautions: Fall Precaution Comments: R hemiparesis secondary to CP Restrictions Weight Bearing Restrictions: No     Mobility  Bed Mobility Overal bed mobility: Needs Assistance Bed Mobility: Rolling, Sidelying to Sit, Sit to Sidelying Rolling: Min guard, Min assist Sidelying to sit: Mod assist, +2 for physical assistance, Max assist     Sit to sidelying: Max assist General bed mobility comments: great difficulty self assisting from sidlying to sitting EOB due to R hemiparesis from her CP and overall weakness/deconditioning.  Max assist/effort just to sit EOB.  Unable to self support so placed the [facility] BACKWARD in front of her so she could use the bar support in partial upright sitting posture.  Required assist to scoot such that L foot was placed on floor.  R LE remained in PF and knee flexed.  Required MAX assist to lay back down and posiiton to comfort.    Transfers Overall transfer level: Needs assistance Equipment used: Rolling  walker (2 wheels) Transfers: Sit to/from Stand Sit to Stand: Max assist           General transfer comment: attempted "Bear Hug" sit to stand however pt unable to clear hips off bed so performed X 5 Therapist Asissted sit to stands.    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  Familiar from prior admit.  Pt stated after her 19 year old Son died in 2022-12-29, she "gave up".  She stated she stayed in bed and "drank herself to a frennzy".  Pt admits to "not walking" and "using briefs to pee in". Pt admits to "deep depression".  Today, shesharred she  wants to get better/get stronger.        Exercises      General Comments        Pertinent Vitals/Pain Pain Assessment Pain Assessment: Faces Faces Pain Scale: Hurts a little bit Pain Location: back (rash) Pain Descriptors / Indicators: Sore, Discomfort, Grimacing Pain Intervention(s): Monitored during session    Home Living                          Prior Function            PT Goals (current goals can now be found in the care plan section) Progress towards PT goals: Progressing toward goals    Frequency    Min 2X/week      PT Plan Current plan remains appropriate    Co-evaluation              AM-PAC PT "6 Clicks" Mobility   Outcome Measure  Help needed turning from your back to your side while in a flat bed without using bedrails?: A Lot Help needed moving from lying on your back to sitting on the side of a flat bed without using bedrails?: A Lot Help needed moving to and from a bed to a chair (including a wheelchair)?: A Lot Help needed standing up from a chair using your arms (e.g., wheelchair or bedside chair)?: Total Help needed to walk in hospital room?: Total Help needed climbing 3-5 steps with a railing? : Total 6 Click Score: 9    End of Session Equipment Utilized During Treatment: Gait belt Activity Tolerance: Patient limited by fatigue Patient left:  in bed;with bed alarm set;with call bell/phone within reach Nurse Communication: Mobility status PT Visit Diagnosis: Other abnormalities of gait and mobility (R26.89);Muscle weakness (generalized) (M62.81)     Time: 4332-9518 PT Time Calculation (min) (ACUTE ONLY): 24 min  Charges:  $Therapeutic Activity: 23-37 mins  Rica Koyanagi  PTA Acute  Rehabilitation Services Office M-F          [phone] Weekend pager [phone]

## 2022-04-03 DIAGNOSIS — F10930 Alcohol use, unspecified with withdrawal, uncomplicated: Secondary | ICD-10-CM | POA: Diagnosis not present

## 2022-04-03 DIAGNOSIS — L309 Dermatitis, unspecified: Secondary | ICD-10-CM | POA: Diagnosis not present

## 2022-04-03 DIAGNOSIS — R5381 Other malaise: Secondary | ICD-10-CM | POA: Diagnosis not present

## 2022-04-03 MED ORDER — ORAL CARE MOUTH RINSE: 15.0000 mL | OROMUCOSAL | Status: DC | PRN

## 2022-04-03 NOTE — Progress Notes (Signed)
Progress Note    [name]   BLT:[id number]  DOB: [birth year]  DOA: 03/22/2022     12 PCP: Kristen Loader, FNP  Initial CC: detox from etoh  Hospital Course: [name] is a 71 yo female with PMH cerebral palsy with seizure disorder, alcohol abuse history with alcohol withdrawal seizure, depression, HTN, HLD, prolonged QTc, history of C. difficile who presented to the ER with wanting to detox from alcohol. Her son passed away the end of 01-04-2023 and she states she has been in severe depression and has been in bed since then with continuous drinking daily.  She states that she drinks approximately 10-12 Coors light per day.  She orders beer via delivery.  Her husband is sober and does not drink in the house. She also has not showered since 01-04-2023.  She states that she has stayed in bed since then and wears diapers which her husband changes. She has been compliant on her Prozac and Keppra.  She states it has been about 2 to 3 years since her last seizure which was associated with alcohol withdrawal at that time.  She has a diffuse rash which is worse on her back.  The rash also involves her chest, arms, and legs.  She has no lesions in her mouth.  She does have a tooth which she states chipped recently and she feels may have possible infection. The rash was discussed with [facility] dermatology however without adequate examination in person, they were unable to provide significant recommendations.  They however did not recommend need for urgent transfer.  Patient admitted for support for alcohol withdrawal and ongoing monitoring of rash along with consultation with psychiatry due to severe depression.  She endorsed no suicidal ideation.  Interval History:  No events overnight.  Still doing okay today.  Rash seems to be looking better and itching remains controlled.  Assessment and Plan: Alcohol withdrawal (HCC)-resolved as of 03/31/2022 - Last drink 6 AM on 03/22/2022 -Last withdrawal seizure  approximately 2 to 3 years ago she reports.  She is on chronic Keppra possibly also in setting of history of cerebral palsy - etoh level negative; NH3 normal (20) -Minimal alcohol withdrawal symptoms during hospitalization - Continue folate, multivitamin, thiamine  Dermatitis - patient states no history of rash in general prior to being bed ridden; denies history of psoriasis or skin conditions in general. She has not showered or been out of bed since end of 01/04/2023 and wearing diapers for voiding and have BMs - ER did discuss with [facility] derm (Dr. Denese Killings) but no pics able to be reviewed; their initial rec was to consider triamcinolone or silvadene cream - much of her rash looks like seborrheic and/or irritant contact dermatitis especially since it's worse on her back which if she's been laying in bed for 3 months would seem expected; scalp also has seborrheic appearance and very neglected hair - ketoconazole shampoo every other day to hair/scalp - bathe patient DAILY -Continue fluconazole, calamine lotion, ketoconazole shampoo  Major depression - Compliant with Prozac she states -Denies suicidal ideation but patient grossly depressed since her son's death the end of 01/04/2023.  She has been laying in bed ordering delivery of beer and has not showered since 01-04-2023 either -Continue Prozac -patient declined psychiatry consult with plans for following up outpatient with her counselor  Broken tooth - left mandibular premolar tooth noted with chipping and poor dentition in general - no systemic signs of infection and no purulent drainage/bad taste  in mouth - hold off on abx for now, but can initiate if develops further signs/symptoms - needs outpatient followup with dentist for extractions  Physical deconditioning - bed bound since end of June - SNF recommended -Patient encouraged to continue working with PT/OT  Essential hypertension - Hold losartan in setting of soft blood pressure  Spastic  hemiplegic cerebral palsy (HCC) - Right lower extremity noted with increased tone and decreased flexibility - Continue Keppra  Seizure disorder (HCC) - see cerebral palsy - Continue Keppra - Keppra level 13 (LLN)  Diarrhea-resolved as of 03/31/2022 - Likely alcohol related   Old records reviewed in assessment of this patient  Antimicrobials:   DVT prophylaxis:  enoxaparin (LOVENOX) injection 40 mg Start: 03/22/22 2215   Code Status:   Code Status: Full Code  Mobility Assessment (last 72 hours)     Mobility Assessment     Row Name 04/03/22 0800 04/02/22 2030 04/02/22 1629 04/02/22 0830 04/01/22 2204   Does patient have an order for bedrest or is patient medically unstable No - Continue assessment No - Continue assessment -- No - Continue assessment No - Continue assessment   What is the highest level of mobility based on the progressive mobility assessment? Level 2 (Chairfast) - Balance while sitting on edge of bed and cannot stand Level 2 (Chairfast) - Balance while sitting on edge of bed and cannot stand Level 2 (Chairfast) - Balance while sitting on edge of bed and cannot stand Level 2 (Chairfast) - Balance while sitting on edge of bed and cannot stand Level 2 (Chairfast) - Balance while sitting on edge of bed and cannot stand   Is the above level different from baseline mobility prior to current illness? Yes - Recommend PT order Yes - Recommend PT order -- Yes - Recommend PT order Yes - Recommend PT order    Row Name 04/01/22 1601 04/01/22 0934         Does patient have an order for bedrest or is patient medically unstable -- No - Continue assessment      What is the highest level of mobility based on the progressive mobility assessment? Level 2 (Chairfast) - Balance while sitting on edge of bed and cannot stand Level 2 (Chairfast) - Balance while sitting on edge of bed and cannot stand      Is the above level different from baseline mobility prior to current illness? -- Yes  - Recommend PT order               Barriers to discharge:  Disposition Plan: SNF Status is: Inpatient  Objective: Blood pressure 125/81, pulse 95, temperature 98.7 F (37.1 C), temperature source Oral, resp. rate 20, height 5\' 5"  (1.651 m), weight 65.6 kg, SpO2 95 %.  Examination:  Physical Exam Constitutional:      Comments: Awake, alert, and cooperative resting in bed in no distress  HENT:     Head: Normocephalic and atraumatic.     Mouth/Throat:     Mouth: Mucous membranes are moist.     Comments: Chipped tooth noted left mandibular premolar area; no mucosal lesions appreciated Eyes:     Extraocular Movements: Extraocular movements intact.  Cardiovascular:     Rate and Rhythm: Normal rate and regular rhythm.  Pulmonary:     Effort: Pulmonary effort is normal.     Breath sounds: Normal breath sounds.  Abdominal:     General: Bowel sounds are normal. There is no distension.     Palpations: Abdomen  is soft.     Tenderness: There is no abdominal tenderness.  Musculoskeletal:        General: No swelling or tenderness.     Cervical back: Normal range of motion and neck supple.     Comments: Stiff right lower extremity, decreased ROM in right ankle chronic in setting of cerebral palsy history  Skin:    Comments: Improved rash throughout body appearing erythematous, scaly, patchy (worse in back, next worse in chest). Then scattered lesions on arms and legs  Neurological:     General: No focal deficit present.     Mental Status: She is oriented to person, place, and time.  Psychiatric:     Comments: Depressed mood but improved       Consultants:    Procedures:    Data Reviewed: No results found for this or any previous visit (from the past 24 hour(s)).   I have Reviewed nursing notes, Vitals, and Lab results since pt's last encounter. Pertinent lab results : see above I have reviewed the last note from staff over past 24 hours I have discussed pt's care plan and  test results with nursing staff, case manager   LOS: 12 days   Lewie Chamber, MD [facility] 04/03/2022, 4:08 PM

## 2022-04-04 DIAGNOSIS — F10930 Alcohol use, unspecified with withdrawal, uncomplicated: Secondary | ICD-10-CM | POA: Diagnosis not present

## 2022-04-04 DIAGNOSIS — L309 Dermatitis, unspecified: Secondary | ICD-10-CM | POA: Diagnosis not present

## 2022-04-04 MED ORDER — INFLUENZA VAC A&B SA ADJ QUAD 0.5 ML IM PRSY
0.5000 mL | PREFILLED_SYRINGE | INTRAMUSCULAR | Status: DC
  Filled 2022-04-04: qty 0.5

## 2022-04-04 NOTE — Progress Notes (Signed)
Physical Therapy Treatment Patient Details Name: Ashley Bradley MRN: [id number] DOB: 01-16-1951 Today's Date: 04/04/2022   History of Present Illness Pt is a 71yo female presenting to [facility] ED on 9/19 via EMS reporting ETOH binge x6weeks with N/V/D and body rash; reporting lying in bed with husband changing diapers. 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    PT Comments    Good progress with mobility today, pt pleasant and motivated. Sit to stand with Stedy and min assist, pt able to stand in [place] for ~45 seconds x 2 for pericare and then for transfer to recliner. Pt performed seated BUE/LE exercises for strengthening.     Recommendations for follow up therapy are one component of a multi-disciplinary discharge planning process, led by the attending physician.  Recommendations may be updated based on patient status, additional functional criteria and insurance authorization.  Follow Up Recommendations  Skilled nursing-short term rehab (<3 hours/day) Can patient physically be transported by private vehicle: No   Assistance Recommended at Discharge Frequent or constant Supervision/Assistance  Patient can return home with the following Two people to help with walking and/or transfers;A lot of help with bathing/dressing/bathroom;Assist for transportation;Help with stairs or ramp for entrance;Assistance with cooking/housework   Equipment Recommendations  None recommended by PT    Recommendations for Other Services       Precautions / Restrictions Precautions Precautions: Fall Precaution Comments: R hemiparesis secondary to CP Restrictions Weight Bearing Restrictions: No     Mobility  Bed Mobility Overal bed mobility: Needs Assistance Bed Mobility: Rolling, Sidelying to Sit Rolling: Min guard Sidelying to sit: Mod assist       General bed mobility comments: verbal/manual cues to initiate roll, mod A to raise trunk    Transfers Overall transfer  level: Needs assistance   Transfers: Sit to/from Stand Sit to Stand: Min assist, From elevated surface     Squat pivot transfers: Total assist     General transfer comment: Min A for sit to stand pulling up on Stedy, pt able to maintain standing position for ~45 seconds for pericare. Pt able to perform one minisquat, BLEs buckled when attempting second rep of minisquat.    Ambulation/Gait                   Stairs             Wheelchair Mobility    Modified Rankin (Stroke Patients Only)       Balance Overall balance assessment: Needs assistance Sitting-balance support: Feet supported, No upper extremity supported Sitting balance-Leahy Scale: Fair     Standing balance support: Bilateral upper extremity supported Standing balance-Leahy Scale: Poor                              Cognition Arousal/Alertness: Awake/alert Behavior During Therapy: WFL for tasks assessed/performed Overall Cognitive Status: Within Functional Limits for tasks assessed                                 General Comments: Able to answer questions and follow commands. Pt pleasant, retired Marine scientist.        Exercises General Exercises - Lower Extremity Long Arc Quad: AROM, Both, 10 reps, Seated Hip Flexion/Marching: AROM, Both, 10 reps, Seated Mini-Sqauts: Other (comment), Limitations, AROM, Standing Mini Squats Limitations: 1 rep; buckled with second attempt Shoulder Exercises Shoulder Flexion: AROM,  Both, 10 reps, Seated    General Comments        Pertinent Vitals/Pain Pain Assessment Faces Pain Scale: Hurts little more Pain Location: R hip and back Pain Descriptors / Indicators: Sore, Discomfort, Grimacing Pain Intervention(s): Limited activity within patient's tolerance, Monitored during session, Repositioned    Home Living                          Prior Function            PT Goals (current goals can now be found in the care plan  section) Acute Rehab PT Goals Patient Stated Goal: back to walking PT Goal Formulation: With patient Time For Goal Achievement: 04/09/22 Potential to Achieve Goals: Good Progress towards PT goals: Progressing toward goals    Frequency    Min 2X/week      PT Plan Current plan remains appropriate    Co-evaluation              AM-PAC PT "6 Clicks" Mobility   Outcome Measure  Help needed turning from your back to your side while in a flat bed without using bedrails?: A Little Help needed moving from lying on your back to sitting on the side of a flat bed without using bedrails?: A Lot Help needed moving to and from a bed to a chair (including a wheelchair)?: Total Help needed standing up from a chair using your arms (e.g., wheelchair or bedside chair)?: A Lot Help needed to walk in hospital room?: Total Help needed climbing 3-5 steps with a railing? : Total 6 Click Score: 10    End of Session Equipment Utilized During Treatment: Gait belt Activity Tolerance: Patient tolerated treatment well Patient left: in chair;with chair alarm set;with call bell/phone within reach;with nursing/sitter in room Nurse Communication: Mobility status;Need for lift equipment PT Visit Diagnosis: Other abnormalities of gait and mobility (R26.89);Muscle weakness (generalized) (M62.81)     Time: 6789-3810 PT Time Calculation (min) (ACUTE ONLY): 19 min  Charges:  $Therapeutic Activity: 8-22 mins                     Ralene Bathe Kistler PT 04/04/2022  Acute Rehabilitation Services  Office [phone]

## 2022-04-04 NOTE — Progress Notes (Signed)
Progress Note    Ashley Bradley   [id number]:[id number]  DOB: [birth year]  DOA: 03/22/2022     13 PCP: Kristen Loader, FNP  Initial CC: detox from etoh  Hospital Course: [name] is a 71 yo female with PMH cerebral palsy with seizure disorder, alcohol abuse history with alcohol withdrawal seizure, depression, HTN, HLD, prolonged QTc, history of C. difficile who presented to the ER with wanting to detox from alcohol. Her son passed away the end of 12-11-22 and she states she has been in severe depression and has been in bed since then with continuous drinking daily.  She states that she drinks approximately 10-12 Coors light per day.  She orders beer via delivery.  Her husband is sober and does not drink in the house. She also has not showered since 12/11/2022.  She states that she has stayed in bed since then and wears diapers which her husband changes. She has been compliant on her Prozac and Keppra.  She states it has been about 2 to 3 years since her last seizure which was associated with alcohol withdrawal at that time.  She has a diffuse rash which is worse on her back.  The rash also involves her chest, arms, and legs.  She has no lesions in her mouth.  She does have a tooth which she states chipped recently and she feels may have possible infection. The rash was discussed with [facility] dermatology however without adequate examination in person, they were unable to provide significant recommendations.  They however did not recommend need for urgent transfer.  Patient admitted for support for alcohol withdrawal and ongoing monitoring of rash along with consultation with psychiatry due to severe depression.  She endorsed no suicidal ideation.  Interval History:  No events overnight.  Patient able to get to the recliner today.  She stood briefly with PT.  She continues to put forth effort and is trying.  She has a long ways to go for reconditioning.  Assessment and Plan: Alcohol withdrawal  (HCC)-resolved as of 03/31/2022 - Last drink 6 AM on 03/22/2022 -Last withdrawal seizure approximately 2 to 3 years ago she reports.  She is on chronic Keppra possibly also in setting of history of cerebral palsy - etoh level negative; NH3 normal (20) -Minimal alcohol withdrawal symptoms during hospitalization - Continue folate, multivitamin, thiamine  Dermatitis - patient states no history of rash in general prior to being bed ridden; denies history of psoriasis or skin conditions in general. She has not showered or been out of bed since end of 2022/12/11 and wearing diapers for voiding and have BMs - ER did discuss with [facility] derm (Dr. Denese Killings) but no pics able to be reviewed; their initial rec was to consider triamcinolone or silvadene cream - much of her rash looks like seborrheic and/or irritant contact dermatitis especially since it's worse on her back which if she's been laying in bed for 3 months would seem expected; scalp also has seborrheic appearance and very neglected hair - ketoconazole shampoo every other day to hair/scalp - bathe patient DAILY -Continue fluconazole, calamine lotion, ketoconazole shampoo  Major depression - Compliant with Prozac she states -Denies suicidal ideation but patient grossly depressed since her son's death the end of 2022-12-11.  She has been laying in bed ordering delivery of beer and has not showered since 12/11/2022 either -Continue Prozac -patient declined psychiatry consult with plans for following up outpatient with her counselor  Broken tooth - left mandibular premolar tooth  noted with chipping and poor dentition in general - no systemic signs of infection and no purulent drainage/bad taste in mouth - hold off on abx for now, but can initiate if develops further signs/symptoms - needs outpatient followup with dentist for extractions  Physical deconditioning - bed bound since end of June - SNF recommended -Patient encouraged to continue working with  PT/OT  Essential hypertension - Hold losartan in setting of soft blood pressure  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HCC) - Right lower extremity noted with increased tone and decreased flexibility - Continue Keppra  Seizure disorder (HCC) - see cerebral palsy - Continue Keppra - Keppra level 13 (LLN)  Diarrhea-resolved as of 03/31/2022 - Likely alcohol related   Old records reviewed in assessment of this patient  Antimicrobials:   DVT prophylaxis:  enoxaparin (LOVENOX) injection 40 mg Start: 03/22/22 2215   Code Status:   Code Status: Full Code  Mobility Assessment (last 72 hours)     Mobility Assessment     Row Name 04/04/22 1248 04/04/22 0900 04/03/22 0800 04/02/22 2030 04/02/22 1629   Does patient have an order for bedrest or is patient medically unstable -- No - Continue assessment No - Continue assessment No - Continue assessment --   What is the highest level of mobility based on the progressive mobility assessment? Level 3 (Stands with assist) - Balance while standing  and cannot march in place Level 2 (Chairfast) - Balance while sitting on edge of bed and cannot stand Level 2 (Chairfast) - Balance while sitting on edge of bed and cannot stand Level 2 (Chairfast) - Balance while sitting on edge of bed and cannot stand Level 2 (Chairfast) - Balance while sitting on edge of bed and cannot stand   Is the above level different from baseline mobility prior to current illness? -- Yes - Recommend PT order Yes - Recommend PT order Yes - Recommend PT order --    Row Name 04/02/22 0830 04/01/22 2204         Does patient have an order for bedrest or is patient medically unstable No - Continue assessment No - Continue assessment      What is the highest level of mobility based on the progressive mobility assessment? Level 2 (Chairfast) - Balance while sitting on edge of bed and cannot stand Level 2 (Chairfast) - Balance while sitting on edge of bed and cannot stand      Is the  above level different from baseline mobility prior to current illness? Yes - Recommend PT order Yes - Recommend PT order               Barriers to discharge:  Disposition Plan: SNF Status is: Inpatient  Objective: Blood pressure 106/62, pulse 94, temperature 98.4 F (36.9 C), temperature source Oral, resp. rate 18, height 5\' 5"  (1.651 m), weight 65.6 kg, SpO2 96 %.  Examination:  Physical Exam Constitutional:      Comments: Awake, alert, and cooperative resting in bed in no distress  HENT:     Head: Normocephalic and atraumatic.     Mouth/Throat:     Mouth: Mucous membranes are moist.     Comments: Chipped tooth noted left mandibular premolar area; no mucosal lesions appreciated Eyes:     Extraocular Movements: Extraocular movements intact.  Cardiovascular:     Rate and Rhythm: Normal rate and regular rhythm.  Pulmonary:     Effort: Pulmonary effort is normal.     Breath sounds: Normal breath sounds.  Abdominal:     General: Bowel sounds are normal. There is no distension.     Palpations: Abdomen is soft.     Tenderness: There is no abdominal tenderness.  Musculoskeletal:        General: No swelling or tenderness.     Cervical back: Normal range of motion and neck supple.     Comments: Stiff right lower extremity, decreased ROM in right ankle chronic in setting of cerebral palsy history  Skin:    Comments: Improved rash throughout body appearing erythematous, scaly, patchy (worse in back, next worse in chest). Then scattered lesions on arms and legs  Neurological:     General: No focal deficit present.     Mental Status: She is oriented to person, place, and time.  Psychiatric:     Comments: Depressed mood but improved       Consultants:    Procedures:    Data Reviewed: No results found for this or any previous visit (from the past 24 hour(s)).   I have Reviewed nursing notes, Vitals, and Lab results since pt's last encounter. Pertinent lab results : see  above I have reviewed the last note from staff over past 24 hours I have discussed pt's care plan and test results with nursing staff, case manager   LOS: 13 days   Lewie Chamber, MD [facility] 04/04/2022, 4:54 PM

## 2022-04-04 NOTE — Care Management Important Message (Signed)
Important Message  Patient Details IM Letter given to the Patient. Name: Ashley Bradley MRN: [id number] Date of Birth: 03/04/1951   Medicare Important Message Given:  Yes     Kerin Salen 04/04/2022, 12:19 PM

## 2022-04-05 ENCOUNTER — Inpatient Hospital Stay (HOSPITAL_COMMUNITY): Payer: Medicare Other

## 2022-04-05 DIAGNOSIS — L309 Dermatitis, unspecified: Secondary | ICD-10-CM | POA: Diagnosis not present

## 2022-04-05 DIAGNOSIS — F10930 Alcohol use, unspecified with withdrawal, uncomplicated: Secondary | ICD-10-CM | POA: Diagnosis not present

## 2022-04-05 DIAGNOSIS — R059 Cough, unspecified: Secondary | ICD-10-CM

## 2022-04-05 LAB — CBC WITH DIFFERENTIAL/PLATELET
Abs Immature Granulocytes: 0.06 10*3/uL (ref 0.00–0.07)
Basophils Absolute: 0.2 10*3/uL — ABNORMAL HIGH (ref 0.0–0.1)
Basophils Relative: 4 %
Eosinophils Absolute: 0.1 10*3/uL (ref 0.0–0.5)
Eosinophils Relative: 2 %
HCT: 43.9 % (ref 36.0–46.0)
Hemoglobin: 13.3 g/dL (ref 12.0–15.0)
Immature Granulocytes: 2 %
Lymphocytes Relative: 28 %
Lymphs Abs: 1 10*3/uL (ref 0.7–4.0)
MCH: 33.3 pg (ref 26.0–34.0)
MCHC: 30.3 g/dL (ref 30.0–36.0)
MCV: 109.8 fL — ABNORMAL HIGH (ref 80.0–100.0)
Monocytes Absolute: 0.9 10*3/uL (ref 0.1–1.0)
Monocytes Relative: 25 %
Neutro Abs: 1.4 10*3/uL — ABNORMAL LOW (ref 1.7–7.7)
Neutrophils Relative %: 39 %
Platelets: 468 10*3/uL — ABNORMAL HIGH (ref 150–400)
RBC: 4 MIL/uL (ref 3.87–5.11)
RDW: 12.9 % (ref 11.5–15.5)
WBC: 3.6 10*3/uL — ABNORMAL LOW (ref 4.0–10.5)
nRBC: 0 % (ref 0.0–0.2)

## 2022-04-05 LAB — BASIC METABOLIC PANEL
Anion gap: 9 (ref 5–15)
BUN: 6 mg/dL — ABNORMAL LOW (ref 8–23)
CO2: 24 mmol/L (ref 22–32)
Calcium: 8.9 mg/dL (ref 8.9–10.3)
Chloride: 104 mmol/L (ref 98–111)
Creatinine, Ser: 0.39 mg/dL — ABNORMAL LOW (ref 0.44–1.00)
GFR, Estimated: >60 mL/min (ref >60)
Glucose, Bld: 89 mg/dL (ref 70–99)
Potassium: 3.7 mmol/L (ref 3.5–5.1)
Sodium: 137 mmol/L (ref 135–145)

## 2022-04-05 LAB — MAGNESIUM: Magnesium: 2 mg/dL (ref 1.7–2.4)

## 2022-04-05 MED ORDER — INFLUENZA VAC A&B SA ADJ QUAD 0.5 ML IM PRSY
0.5000 mL | PREFILLED_SYRINGE | INTRAMUSCULAR | Status: DC
  Filled 2022-04-05: qty 0.5

## 2022-04-05 NOTE — Assessment & Plan Note (Signed)
-   Patient developed very low-grade fever this morning, 100.4.  She also had a little bit of a cough overnight.  She is relatively bedbound and has only recently been attempting to get up with PT - Labs repeated this morning and no significant concern for infection - CXR repeated, remains clear - Patient encouraged to use incentive spirometer while in bed

## 2022-04-05 NOTE — Progress Notes (Signed)
78 Day PASRR Note   Patient Details  Name: Ashley Bradley Date of Birth: 1950/07/25   Transition of Care [facility]) CM/SW Contact:    Roseanne Kaufman, RN Phone Number: [phone] 04/05/2022, 2:55 PM  To Whom It May Concern:  Please be advised that this patient will require a short-term nursing home stay - anticipated 30 days or less for rehabilitation and strengthening.   The plan is for return home.

## 2022-04-05 NOTE — Progress Notes (Signed)
Progress Note    Ashley Bradley   IWL:[id number]  DOB: [birth year]  DOA: 03/22/2022     14 PCP: Soundra Pilon, FNP  Initial CC: detox from etoh  Hospital Course: Ashley Bradley is a 71 yo female with PMH cerebral palsy with seizure disorder, alcohol abuse history with alcohol withdrawal seizure, depression, HTN, HLD, prolonged QTc, history of C. difficile who presented to the ER with wanting to detox from alcohol. Her son passed away the end of January 07, 2023 and she states she has been in severe depression and has been in bed since then with continuous drinking daily.  She states that she drinks approximately 10-12 Coors light per day.  She orders beer via delivery.  Her husband is sober and does not drink in the house. She also has not showered since 07-Jan-2023.  She states that she has stayed in bed since then and wears diapers which her husband changes. She has been compliant on her Prozac and Keppra.  She states it has been about 2 to 3 years since her last seizure which was associated with alcohol withdrawal at that time.  She has a diffuse rash which is worse on her back.  The rash also involves her chest, arms, and legs.  She has no lesions in her mouth.  She does have a tooth which she states chipped recently and she feels may have possible infection. The rash was discussed with [facility] dermatology however without adequate examination in person, they were unable to provide significant recommendations.  They however did not recommend need for urgent transfer.  Patient admitted for support for alcohol withdrawal and ongoing monitoring of rash along with consultation with psychiatry due to severe depression.  She endorsed no suicidal ideation.  Interval History:  No events overnight.  Husband present bedside this morning.  Patient described her low-grade fever and cough overnight.  Informed her we would order labs and CXR to evaluate.  Otherwise doing okay.  She is stable for discharge to rehab when  found.  Assessment and Plan: Alcohol withdrawal (HCC)-resolved as of 03/31/2022 - Last drink 6 AM on 03/22/2022 -Last withdrawal seizure approximately 2 to 3 years ago she reports.  She is on chronic Keppra possibly also in setting of history of cerebral palsy - etoh level negative; NH3 normal (20) -Minimal alcohol withdrawal symptoms during hospitalization - Continue folate, multivitamin, thiamine  Dermatitis - patient states no history of rash in general prior to being bed ridden; denies history of psoriasis or skin conditions in general. She has not showered or been out of bed since end of 07-Jan-2023 and wearing diapers for voiding and have BMs - ER did discuss with [facility] derm (Dr. Nolon Bussing) but no pics able to be reviewed; their initial rec was to consider triamcinolone or silvadene cream - much of her rash looks like seborrheic and/or irritant contact dermatitis especially since it's worse on her back which if she's been laying in bed for 3 months would seem expected; scalp also has seborrheic appearance and very neglected hair - ketoconazole shampoo every other day to hair/scalp - bathe patient DAILY -Continue fluconazole, calamine lotion, ketoconazole shampoo  Cough - Patient developed very low-grade fever this morning, 100.4.  She also had a little bit of a cough overnight.  She is relatively bedbound and has only recently been attempting to get up with PT - Labs repeated this morning and no significant concern for infection - CXR repeated, remains clear - Patient encouraged to use incentive  spirometer while in bed  Major depression - Compliant with Prozac she states -Denies suicidal ideation but patient grossly depressed since her son's death the end of December 21, 2022.  She has been laying in bed ordering delivery of beer and has not showered since 12-21-22 either -Continue Prozac -patient declined psychiatry consult with plans for following up outpatient with her counselor  Broken tooth - left  mandibular premolar tooth noted with chipping and poor dentition in general - no systemic signs of infection and no purulent drainage/bad taste in mouth - hold off on abx for now, but can initiate if develops further signs/symptoms - needs outpatient followup with dentist for extractions  Physical deconditioning - bed bound since end of 12/21/22 - SNF recommended -Patient encouraged to continue working with PT/OT  Essential hypertension - Hold losartan in setting of soft blood pressure  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[place]) - Right lower extremity noted with increased tone and decreased flexibility - Continue Keppra  Seizure disorder ([place]) - see cerebral palsy - Continue Keppra - Keppra level 13 (LLN)  Diarrhea-resolved as of 03/31/2022 - Likely alcohol related   Old records reviewed in assessment of this patient  Antimicrobials:   DVT prophylaxis:  enoxaparin (LOVENOX) injection 40 mg Start: 03/22/22 2215   Code Status:   Code Status: Full Code  Mobility Assessment (last 72 hours)     Mobility Assessment     Row Name 04/05/22 0800 04/04/22 2221 04/04/22 1248 04/04/22 0900 04/03/22 0800   Does patient have an order for bedrest or is patient medically unstable No - Continue assessment No - Continue assessment -- No - Continue assessment No - Continue assessment   What is the highest level of mobility based on the progressive mobility assessment? Level 3 (Stands with assist) - Balance while standing  and cannot march in place Level 3 (Stands with assist) - Balance while standing  and cannot march in place Level 3 (Stands with assist) - Balance while standing  and cannot march in place Level 2 (Chairfast) - Balance while sitting on edge of bed and cannot stand Level 2 (Chairfast) - Balance while sitting on edge of bed and cannot stand   Is the above level different from baseline mobility prior to current illness? Yes - Recommend PT order Yes - Recommend PT order -- Yes -  Recommend PT order Yes - Recommend PT order    [place] Name 04/02/22 2030 04/02/22 1629         Does patient have an order for bedrest or is patient medically unstable No - Continue assessment --      What is the highest level of mobility based on the progressive mobility assessment? Level 2 (Chairfast) - Balance while sitting on edge of bed and cannot stand Level 2 (Chairfast) - Balance while sitting on edge of bed and cannot stand      Is the above level different from baseline mobility prior to current illness? Yes - Recommend PT order --               Barriers to discharge:  Disposition Plan: SNF.  Medically stable Status is: Inpatient  Objective: Blood pressure 120/74, pulse 87, temperature 99 F (37.2 C), temperature source Oral, resp. rate 17, height 5\' 5"  (1.651 m), weight 65.6 kg, SpO2 96 %.  Examination:  Physical Exam Constitutional:      Comments: Awake, alert, and cooperative resting in bed in no distress  HENT:     Head: Normocephalic and atraumatic.  Mouth/Throat:     Mouth: Mucous membranes are moist.     Comments: Chipped tooth noted left mandibular premolar area; no mucosal lesions appreciated Eyes:     Extraocular Movements: Extraocular movements intact.  Cardiovascular:     Rate and Rhythm: Normal rate and regular rhythm.  Pulmonary:     Effort: Pulmonary effort is normal.     Breath sounds: Normal breath sounds.  Abdominal:     General: Bowel sounds are normal. There is no distension.     Palpations: Abdomen is soft.     Tenderness: There is no abdominal tenderness.  Musculoskeletal:        General: No swelling or tenderness.     Cervical back: Normal range of motion and neck supple.     Comments: Stiff right lower extremity, decreased ROM in right ankle chronic in setting of cerebral palsy history  Skin:    Comments: Improved rash throughout body appearing erythematous, scaly, patchy (worse in back, next worse in chest). Then scattered lesions on  arms and legs  Neurological:     General: No focal deficit present.     Mental Status: She is oriented to person, place, and time.  Psychiatric:     Comments: Improved mood      Consultants:    Procedures:    Data Reviewed: Results for orders placed or performed during the hospital encounter of 03/22/22 (from the past 24 hour(s))  Basic metabolic panel     Status: Abnormal   Collection Time: 04/05/22 12:01 PM  Result Value Ref Range   Sodium 137 135 - 145 mmol/L   Potassium 3.7 3.5 - 5.1 mmol/L   Chloride 104 98 - 111 mmol/L   CO2 24 22 - 32 mmol/L   Glucose, Bld 89 70 - 99 mg/dL   BUN 6 (L) 8 - 23 mg/dL   Creatinine, Ser 2.54 (L) 0.44 - 1.00 mg/dL   Calcium 8.9 8.9 - 27.0 mg/dL   GFR, Estimated >62 >37 mL/min   Anion gap 9 5 - 15  CBC with Differential/Platelet     Status: Abnormal   Collection Time: 04/05/22 12:01 PM  Result Value Ref Range   WBC 3.6 (L) 4.0 - 10.5 K/uL   RBC 4.00 3.87 - 5.11 MIL/uL   Hemoglobin 13.3 12.0 - 15.0 g/dL   HCT 62.8 31.5 - 17.6 %   MCV 109.8 (H) 80.0 - 100.0 fL   MCH 33.3 26.0 - 34.0 pg   MCHC 30.3 30.0 - 36.0 g/dL   RDW 16.0 73.7 - 10.6 %   Platelets 468 (H) 150 - 400 K/uL   nRBC 0.0 0.0 - 0.2 %   Neutrophils Relative % 39 %   Neutro Abs 1.4 (L) 1.7 - 7.7 K/uL   Lymphocytes Relative 28 %   Lymphs Abs 1.0 0.7 - 4.0 K/uL   Monocytes Relative 25 %   Monocytes Absolute 0.9 0.1 - 1.0 K/uL   Eosinophils Relative 2 %   Eosinophils Absolute 0.1 0.0 - 0.5 K/uL   Basophils Relative 4 %   Basophils Absolute 0.2 (H) 0.0 - 0.1 K/uL   Immature Granulocytes 2 %   Abs Immature Granulocytes 0.06 0.00 - 0.07 K/uL  Magnesium     Status: None   Collection Time: 04/05/22 12:01 PM  Result Value Ref Range   Magnesium 2.0 1.7 - 2.4 mg/dL     I have Reviewed nursing notes, Vitals, and Lab results since pt's last encounter. Pertinent lab results : see  above I have reviewed the last note from staff over past 24 hours I have discussed pt's care plan  and test results with nursing staff, case manager   LOS: 14 days   Lewie Chamber, MD [facility] 04/05/2022, 2:34 PM

## 2022-04-05 NOTE — Plan of Care (Signed)
  Problem: Education: Goal: Knowledge of General Education information will improve Description: Including pain rating scale, medication(s)/side effects and non-pharmacologic comfort measures Outcome: Progressing   Problem: Clinical Measurements: Goal: Diagnostic test results will improve Outcome: Progressing Goal: Respiratory complications will improve Outcome: Progressing   Problem: Safety: Goal: Ability to remain free from injury will improve Outcome: Progressing   

## 2022-04-06 DIAGNOSIS — F10939 Alcohol use, unspecified with withdrawal, unspecified: Secondary | ICD-10-CM

## 2022-04-06 LAB — CBC
HCT: 39.8 % (ref 36.0–46.0)
Hemoglobin: 12.8 g/dL (ref 12.0–15.0)
MCH: 32.7 pg (ref 26.0–34.0)
MCHC: 32.2 g/dL (ref 30.0–36.0)
MCV: 101.8 fL — ABNORMAL HIGH (ref 80.0–100.0)
Platelets: 567 10*3/uL — ABNORMAL HIGH (ref 150–400)
RBC: 3.91 MIL/uL (ref 3.87–5.11)
RDW: 12.9 % (ref 11.5–15.5)
WBC: 3.7 10*3/uL — ABNORMAL LOW (ref 4.0–10.5)
nRBC: 0 % (ref 0.0–0.2)

## 2022-04-06 LAB — COMPREHENSIVE METABOLIC PANEL
ALT: 61 U/L — ABNORMAL HIGH (ref 0–44)
AST: 72 U/L — ABNORMAL HIGH (ref 15–41)
Albumin: 3.3 g/dL — ABNORMAL LOW (ref 3.5–5.0)
Alkaline Phosphatase: 137 U/L — ABNORMAL HIGH (ref 38–126)
Anion gap: 5 (ref 5–15)
BUN: 8 mg/dL (ref 8–23)
CO2: 26 mmol/L (ref 22–32)
Calcium: 8.5 mg/dL — ABNORMAL LOW (ref 8.9–10.3)
Chloride: 104 mmol/L (ref 98–111)
Creatinine, Ser: 0.42 mg/dL — ABNORMAL LOW (ref 0.44–1.00)
GFR, Estimated: >60 mL/min (ref >60)
Glucose, Bld: 106 mg/dL — ABNORMAL HIGH (ref 70–99)
Potassium: 3.9 mmol/L (ref 3.5–5.1)
Sodium: 135 mmol/L (ref 135–145)
Total Bilirubin: 0.5 mg/dL (ref 0.3–1.2)
Total Protein: 7.2 g/dL (ref 6.5–8.1)

## 2022-04-06 MED ORDER — VANCOMYCIN HCL 1250 MG/250ML IV SOLN
1250.0000 mg | Freq: Every day | INTRAVENOUS | Status: DC
  Filled 2022-04-06: qty 250

## 2022-04-06 MED ORDER — SODIUM CHLORIDE 0.9 % IV SOLN
2.0000 g | Freq: Two times a day (BID) | INTRAVENOUS | Status: DC
  Administered 2022-04-06 – 2022-04-07 (×4): 2 g via INTRAVENOUS
  Filled 2022-04-06 (×4): qty 12.5

## 2022-04-06 MED ORDER — VANCOMYCIN HCL 1250 MG/250ML IV SOLN
1250.0000 mg | Freq: Every day | INTRAVENOUS | Status: DC
  Administered 2022-04-06 – 2022-04-08 (×3): 1250 mg via INTRAVENOUS
  Filled 2022-04-06 (×3): qty 250

## 2022-04-06 NOTE — TOC Progression Note (Addendum)
Transition of Care [facility]) - Progression Note    Patient Details  Name: Ashley Bradley MRN: [id number] Date of Birth: 1951/03/27  Transition of Care [facility]) CM/SW [place], RN Phone Number: 04/06/2022, 10:44 AM  Clinical Narrative:   Awaiting PASRR review (level II) to obtain PASRR ID.  TOC will continue to follow.    Expected Discharge Plan: Skilled Nursing Facility Barriers to Discharge: Continued Medical Work up  Expected Discharge Plan and Services Expected Discharge Plan: [place]   Discharge Planning Services: CM Consult                                           Social Determinants of Health (SDOH) Interventions    Readmission Risk Interventions     No data to display

## 2022-04-06 NOTE — Progress Notes (Signed)
Pharmacy Antibiotic Note  Ashley Bradley is a 71 y.o. female admitted on 03/22/2022 with EtOH withdrawal; bedbound/unbathed since June, and noted to be covered in a diffuse rash on trunk and extremities. Previously treating rash with topical/PO antifungals (to cover seborrheic dermatitis) and topical steroids. Patient spiked a new fever overnight, however, and Pharmacy was consulted for vancomycin and cefepime dosing. Patient reports cutaneous reaction to Ancef (skin peeling & blistering), but has tolerated Unasyn, Zosyn, and Rocephin in the past without any reported reaction.  Plan: Vancomycin 1250 mg IV q24 hr (est AUC 508 based on SCr adjusted to 0.8 for immobility; Vd 0.72) Measure vancomycin AUC at steady state as indicated SCr q48 while on vanc Cefepime 2 g IV q12 hr   Height: 5\' 5"  (165.1 cm) Weight: 65.6 kg (144 lb 10 oz) IBW/kg (Calculated) : 57  Temp (24hrs), Avg:99.9 F (37.7 C), Min:98.6 F (37 C), Max:102.9 F (39.4 C)  Recent Labs  Lab 03/31/22 0413 04/05/22 1201  WBC 5.5 3.6*  CREATININE 0.41* 0.39*    Estimated Creatinine Clearance: 57.2 mL/min (A) (by C-G formula based on SCr of 0.39 mg/dL (L)).    Allergies  Allergen Reactions   Ancef [Cefazolin] Other (See Comments)    Skin blisters, peels Has tolerated Unasyn, Rocephin, Zosyn previously   Pantopaque [Iophendylate] Other (See Comments)    Aseptic meningitis.   Macrodantin [Nitrofurantoin] Nausea And Vomiting   Morphine And Related Nausea And Vomiting    Per patient, only has reaction when given IV. Okay to take PO.    Antimicrobials this admission: 10/4 vancomycin >>  10/4 cefepime >>   Dose adjustments this admission:  Microbiology results: 10/4 BCx: sent  Thank you for allowing pharmacy to be a part of this patient's care.  Chaslyn Eisen A 04/06/2022 9:12 AM

## 2022-04-06 NOTE — Progress Notes (Signed)
[name] PROGRESS NOTE  [name]  DOB: 1951/03/16  PCP: Soundra Pilon, FNP JME:[id number]  DOA: 03/22/2022  LOS: 8 days  Hospital Day: 9  Brief narrative: Ashley Bradley is a 71 y.o. female with PMH significant for cerebral palsy with seizure disorder, alcohol abuse with alcohol withdrawal seizure, depression, HTN, HLD, prolonged QTc, history of C. Difficile 90-19, patient presented to the ER with wanting to detox from alcohol. Her son passed away the end of December 26, 2022 and she states she has been in severe depression and has been in bed since then with continuous drinking daily.  She states that she drinks approximately 10-12 Coors light per day.  She orders beer via delivery.  Her husband is sober and does not drink in the house. She also has not showered since December 26, 2022.  She states that she has stayed in bed since then and wears diapers which her husband changes. She has been compliant on her Prozac and Keppra.  She states it has been about 2 to 3 years since her last seizure which was associated with alcohol withdrawal at that time.  In the ED, she was tachycardic to 130s CBC unremarkable.  BMP with potassium low at 3.7, normal renal function On exam, she was noted to have diffuse rash all over her body which is worse on her back. She has no lesions in her mouth.   The rash was discussed with [facility] dermatology however without adequate examination in person, they were unable to provide significant recommendations.  They did not recommend urgent transfer.  Patient also did not wish to transfer.  Bed availability of course also unavailable beds at [facility].   Patient was admitted to [facility] for alcohol withdrawal and ongoing monitoring of rash as well as suspicion of severe depression.   She endorsed no suicidal ideation.   Subjective: Patient was seen and examined this afternoon.  Pleasant elderly Caucasian female.  Lying down in bed.  Not in distress.  Husband is at bedside. Chart reviewed.  For  the last 24 hours, patient has multiple spikes of fever as high as 102.9.  Assessment and plan: # Fever For the last 24 hours, patient has multiple spikes of fever as high as 102.9.  WBC count low. This morning, I ordered for blood culture and empiric antibiotic coverage with IV cefepime and IV vancomycin. Urinalysis 10/3 unremarkable.  Chest x-ray unremarkable. Continue monitor the trend with antibiotics. Recent Labs  Lab 03/31/22 0413 04/05/22 1201 04/06/22 0940  WBC 5.5 3.6* 3.7*    Chronic alcoholism  Alcohol withdrawal symptoms Initially admitted for alcohol withdrawal symptoms.  Patient apparently was drinking heavily since her son expired in 12-26-22.  She presented to the ED wanting to be detoxed. Last drink in the morning of 03/22/2022 Was monitored for withdrawal symptoms with CIWA protocol.  Treated with a scheduled Valium 5 mg twice daily as well as as needed Ativan.  She did not have significant withdrawal symptoms. Valium and Ativan has been stopped.  Mental status much better. Continue folate, multivitamin, thiamine  Hypokalemia Potassium level low at 3 this morning.  Replacement given. Recent Labs  Lab 03/31/22 0413 04/05/22 1201 04/06/22 0940  K 4.2 3.7 3.9  MG  --  2.0  --    Dermatitis Likely because of bedridden status.  Apparently, she has not showered or been out of bed since end of 12/26/22 and wearing diapers for voiding and have [facility] ER did discuss with [facility] derm (Dr. Nolon Bussing) but  no pics able to be reviewed; their initial rec was to consider triamcinolone or silvadene cream. Much of her rash looks like seborrheic and/or irritant contact dermatitis especially since it's worse on her back which if she's been laying in bed for 3 months would seem expected; scalp also has seborrheic appearance and very neglected hair Started on ketoconazole shampoo every other day to hair/scalp Please bathe patient daily Continue triamcinolone 0.025% (low potency) cream and  ketoconazole cream to body affected with rash (chest, back, arms, legs, etc).  Also added calamine lotion.   Major depression Reports compliance to Prozac.   Denies suicidal ideation but patient grossly depressed since her son's death the end of 2022-12-20.  She has been laying in bed ordering delivery of beer and has not showered since December 20, 2022 either. Continue Prozac Psychiatry consult offered.  Patient refused and wanted to keep the follow-up plan with her counselor as before.  Sinus tachycardia Tachycardia improved with control of withdrawal symptoms with benzodiazepine.     Essential hypertension Losartan remain on hold.  Blood pressure normal.  Resume when blood pressure rises.  Physical deconditioning bed bound since end of 20-Dec-2022 PT eval obtained.  SNF recommended.  History of seizure disorder Patient reports that her last withdrawal seizure was about 2 to 3 years ago. Continue Keppra   Spastic hemiplegic cerebral palsy  Right lower extremity noted with increased tone and decreased flexibility Continue Keppra  Goals of care   Code Status: Full Code   Mobility: PT eval obtained.  SNF recommended  Skin assessment: Generalized rashes improving.  Nutritional status:  Body mass index is 24.07 kg/m.          Diet:  Diet Order             Diet regular Room service appropriate? No; Fluid consistency: Thin  Diet effective now                   DVT prophylaxis:  enoxaparin (LOVENOX) injection 40 mg Start: 03/22/22 2215   Antimicrobials: IV cefepime, IV vancomycin topical steroid, oral fluconazole Fluid: Not IV fluid Consultants: None Family Communication: Husband at bedside   Status is: Inpatient  Continue inhospital care because: Pending SNF Level of care: Med-Surg   Dispo: The patient is from: Home              Anticipated d/c is to: Pending SNF              Patient currently is medically stable to d/c.   Difficult to place patient No     Infusions:    ceFEPime (MAXIPIME) IV 2 g (04/06/22 1308)   vancomycin 1,250 mg (04/06/22 1345)     Scheduled Meds:  calamine  1 Application Topical TID   enoxaparin (LOVENOX) injection  40 mg Subcutaneous Q24H   fluconazole  100 mg Oral Daily   FLUoxetine  40 mg Oral q morning   folic acid  1 mg Oral Daily   influenza vaccine adjuvanted  0.5 mL Intramuscular Tomorrow-1000   ketoconazole   Topical QODAY   levETIRAcetam  500 mg Oral BID   multivitamin with minerals  1 tablet Oral Daily   saccharomyces boulardii  500 mg Oral QPM   sodium chloride flush  3 mL Intravenous Q12H   thiamine  100 mg Oral Daily   triamcinolone cream   Topical BID    PRN meds: acetaminophen **OR** acetaminophen, hydrOXYzine, mouth rinse, oxyCODONE, polyethylene glycol   Antimicrobials: Anti-infectives (From admission, onward)  Start     Dose/Rate Route Frequency Ordered Stop   04/06/22 1000  vancomycin (VANCOREADY) IVPB 1250 mg/250 mL  Status:  Discontinued        1,250 mg 166.7 mL/hr over 90 Minutes Intravenous Daily 04/06/22 0900 04/06/22 0904   04/06/22 1000  vancomycin (VANCOREADY) IVPB 1250 mg/250 mL        1,250 mg 166.7 mL/hr over 90 Minutes Intravenous Daily 04/06/22 0904     04/06/22 0930  ceFEPIme (MAXIPIME) 2 g in sodium chloride 0.9 % 100 mL IVPB        2 g 200 mL/hr over 30 Minutes Intravenous 2 times daily 04/06/22 0900     03/26/22 1300  fluconazole (DIFLUCAN) tablet 100 mg        100 mg Oral Daily 03/26/22 1203         Objective: Vitals:   04/06/22 1229 04/06/22 1503  BP: 125/67   Pulse: 87   Resp: 18   Temp: (!) 101.2 F (38.4 C) 99.3 F (37.4 C)  SpO2: 95%     Intake/Output Summary (Last 24 hours) at 04/06/2022 1714 Last data filed at 04/06/2022 1600 Gross per 24 hour  Intake 870 ml  Output 1000 ml  Net -130 ml   Filed Weights   03/22/22 2118 04/05/22 1014  Weight: 65.6 kg 65.6 kg   Weight change:  Body mass index is 24.07 kg/m.   Physical Exam: General exam: Pleasant  elderly Caucasian female.  Not in physical distress. Skin: Generalized very pruritic skin rash improving. HEENT: Atraumatic, normocephalic, no obvious bleeding Lungs: Clear to auscultation bilaterally CVS: Regular rate and rhythm, no murmur GI/Abd soft, nontender, nondistended, bowel sound present CNS: Alert, awake, oriented x3.  Improved tremors in both sides Psychiatry: Mood appropriate Extremities: No pedal edema, no calf tenderness  Data Review: I have personally reviewed the laboratory data and studies available.  F/u labs ordered Unresulted Labs (From admission, onward)     Start     Ordered   04/07/22 0500  Basic metabolic panel  Daily at 5am,   R     Question:  Specimen collection method  Answer:  Lab=Lab collect   04/06/22 0806   04/07/22 0500  CBC with Differential/Platelet  Daily at 5am,   R     Question:  Specimen collection method  Answer:  Lab=Lab collect   04/06/22 0806   04/07/22 0500  Magnesium  Tomorrow morning,   R       Question:  Specimen collection method  Answer:  Lab=Lab collect   04/06/22 0807   04/07/22 0500  Phosphorus  Tomorrow morning,   R       Question:  Specimen collection method  Answer:  Lab=Lab collect   04/06/22 0807   04/07/22 0500  Creatinine, serum  Every 48 hours,   R     Question:  Specimen collection method  Answer:  Lab=Lab collect   04/06/22 0900   04/06/22 0806  Culture, blood (Routine X 2) w Reflex to ID Panel  BLOOD CULTURE X 2,   R      04/06/22 0806            Signed, Lorin Glass, MD [facility] 04/06/2022.

## 2022-04-06 NOTE — Plan of Care (Signed)

## 2022-04-07 ENCOUNTER — Inpatient Hospital Stay (HOSPITAL_COMMUNITY): Payer: Medicare Other

## 2022-04-07 DIAGNOSIS — F10931 Alcohol use, unspecified with withdrawal delirium: Secondary | ICD-10-CM

## 2022-04-07 LAB — CBC WITH DIFFERENTIAL/PLATELET
Abs Immature Granulocytes: 0.01 10*3/uL (ref 0.00–0.07)
Basophils Absolute: 0.1 10*3/uL (ref 0.0–0.1)
Basophils Relative: 1 %
Eosinophils Absolute: 0.1 10*3/uL (ref 0.0–0.5)
Eosinophils Relative: 2 %
HCT: 40.3 % (ref 36.0–46.0)
Hemoglobin: 12.9 g/dL (ref 12.0–15.0)
Immature Granulocytes: 0 %
Lymphocytes Relative: 35 %
Lymphs Abs: 1.2 10*3/uL (ref 0.7–4.0)
MCH: 32.4 pg (ref 26.0–34.0)
MCHC: 32 g/dL (ref 30.0–36.0)
MCV: 101.3 fL — ABNORMAL HIGH (ref 80.0–100.0)
Monocytes Absolute: 0.7 10*3/uL (ref 0.1–1.0)
Monocytes Relative: 19 %
Neutro Abs: 1.5 10*3/uL — ABNORMAL LOW (ref 1.7–7.7)
Neutrophils Relative %: 43 %
Platelets: 561 10*3/uL — ABNORMAL HIGH (ref 150–400)
RBC: 3.98 MIL/uL (ref 3.87–5.11)
RDW: 12.9 % (ref 11.5–15.5)
WBC: 3.5 10*3/uL — ABNORMAL LOW (ref 4.0–10.5)
nRBC: 0 % (ref 0.0–0.2)

## 2022-04-07 LAB — BASIC METABOLIC PANEL
Anion gap: 10 (ref 5–15)
BUN: 8 mg/dL (ref 8–23)
CO2: 23 mmol/L (ref 22–32)
Calcium: 8.7 mg/dL — ABNORMAL LOW (ref 8.9–10.3)
Chloride: 102 mmol/L (ref 98–111)
Creatinine, Ser: 0.42 mg/dL — ABNORMAL LOW (ref 0.44–1.00)
GFR, Estimated: >60 mL/min (ref >60)
Glucose, Bld: 96 mg/dL (ref 70–99)
Potassium: 4.3 mmol/L (ref 3.5–5.1)
Sodium: 135 mmol/L (ref 135–145)

## 2022-04-07 LAB — PHOSPHORUS: Phosphorus: 4.1 mg/dL (ref 2.5–4.6)

## 2022-04-07 LAB — MAGNESIUM: Magnesium: 1.9 mg/dL (ref 1.7–2.4)

## 2022-04-07 MED ORDER — IOHEXOL 300 MG/ML  SOLN
100.0000 mL | Freq: Once | INTRAMUSCULAR | Status: AC | PRN
  Administered 2022-04-07: 100 mL via INTRAVENOUS

## 2022-04-07 MED ORDER — SODIUM CHLORIDE (PF) 0.9 % IJ SOLN
INTRAMUSCULAR | Status: AC
  Filled 2022-04-07: qty 50

## 2022-04-07 MED ORDER — ONDANSETRON HCL 4 MG/2ML IJ SOLN
4.0000 mg | Freq: Four times a day (QID) | INTRAMUSCULAR | Status: DC | PRN
  Administered 2022-04-07: 4 mg via INTRAVENOUS
  Filled 2022-04-07: qty 2

## 2022-04-07 MED ORDER — IOHEXOL 9 MG/ML PO SOLN
ORAL | Status: AC
  Filled 2022-04-07: qty 1000

## 2022-04-07 MED ORDER — IOHEXOL 9 MG/ML PO SOLN
500.0000 mL | ORAL | Status: AC
  Administered 2022-04-07 (×2): 500 mL via ORAL

## 2022-04-07 NOTE — NC FL2 (Signed)
[place] LEVEL OF CARE SCREENING TOOL     IDENTIFICATION  Patient Name: Ashley Bradley Birthdate: 12/22/1950 Sex: female Admission Date (Current Location): 03/22/2022  [facility] and [place] Number:  Herbalist and Address:  [facility],  [place] [address] [place]      Provider Number: [id number]  Attending Physician Name and Address:  Terrilee Croak, MD  Relative Name and Phone Number:  Lajada Janes (spouse)    Current Level of Care: Hospital Recommended Level of Care: [place] Prior Approval Number:    Date Approved/Denied:   PASRR Number: PASRR pending  Discharge Plan: SNF    Current Diagnoses: Patient Active Problem List   Diagnosis Date Noted   Cough 04/05/2022   Dermatitis 03/22/2022   Physical deconditioning 03/22/2022   Broken tooth 03/22/2022   Essential hypertension 05/12/2021   Transaminitis 05/12/2021   Total bilirubin, elevated 05/12/2021   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[place]) 04/15/2020   H/O febrile seizure 04/15/2020   DTs (delirium tremens) ([place]) 01/24/2020   Alcohol withdrawal seizure ([place]) 01/24/2020   High anion gap metabolic acidosis 93/26/7124   Melena 06/29/2016   Syncope 01/26/2016   Prolonged Q-T interval on ECG 01/26/2016   History of Clostridium difficile colitis 01/26/2016   Elevated troponin    Alcohol use disorder, moderate, in early remission, dependence ([place]) 01/21/2016   AKI (acute kidney injury) ([place]) 01/16/2016   Seizure disorder ([place]) 09/24/2013   Hyponatremia 09/20/2013   Major depression 03/19/2013   Alcohol intoxication in relapsed alcoholic ([place]) 58/03/9832    Orientation RESPIRATION BLADDER Height & Weight     Self, Time, Situation, Place  Normal Continent Weight: 65.6 kg Height:  5\' 5"  (165.1 cm)  BEHAVIORAL SYMPTOMS/MOOD NEUROLOGICAL BOWEL NUTRITION STATUS    Convulsions/Seizures Continent Diet (regular, thin fluids)  AMBULATORY STATUS COMMUNICATION OF  NEEDS Skin   Limited Assist Verbally Other (Comment) (red rash all over back LEs, UEs)                       Personal Care Assistance Level of Assistance  Bathing, Feeding, Dressing Bathing Assistance: Limited assistance Feeding assistance: Limited assistance Dressing Assistance: Limited assistance     Functional Limitations Info  Sight, Hearing, Speech Sight Info: Adequate Hearing Info: Adequate Speech Info: Adequate    SPECIAL CARE FACTORS FREQUENCY  PT (By licensed PT), OT (By licensed OT)     PT Frequency: 5x per week OT Frequency: 5x per week            Contractures Contractures Info: Present    Additional Factors Info  Code Status, Allergies Code Status Info: Full Allergies Info: Ancef (Cefazolin), Pantopaque (Iophendylate), Macrodantin (Nitrofurantoin), Morphine And Related Psychotropic Info: diazepam (Valium) tablet 5 mg / see medication list         Current Medications (04/07/2022):  This is the current hospital active medication list Current Facility-Administered Medications  Medication Dose Route Frequency Provider Last Rate Last Admin   acetaminophen (TYLENOL) tablet 650 mg  650 mg Oral Q6H PRN Dwyane Dee, MD   650 mg at 04/06/22 1312   Or   acetaminophen (TYLENOL) suppository 650 mg  650 mg Rectal Q6H PRN Dwyane Dee, MD       calamine lotion 1 Application  1 Application Topical TID Terrilee Croak, MD   1 Application at 82/50/53 0823   ceFEPIme (MAXIPIME) 2 g in sodium chloride 0.9 % 100 mL IVPB  2 g Intravenous BID  Danford Bad, RPH 200 mL/hr at 04/07/22 1012 2 g at 04/07/22 1012   enoxaparin (LOVENOX) injection 40 mg  40 mg Subcutaneous Q24H Lewie Chamber, MD   40 mg at 04/06/22 2036   fluconazole (DIFLUCAN) tablet 100 mg  100 mg Oral Daily Dahal, Melina Schools, MD   100 mg at 04/07/22 1610   FLUoxetine (PROZAC) capsule 40 mg  40 mg Oral q morning Lewie Chamber, MD   40 mg at 04/07/22 9604   folic acid (FOLVITE) tablet 1 mg  1 mg Oral Daily  Lewie Chamber, MD   1 mg at 04/07/22 5409   hydrOXYzine (ATARAX) tablet 25 mg  25 mg Oral BID PRN Lewie Chamber, MD   25 mg at 04/02/22 2032   influenza vaccine adjuvanted (FLUAD) injection 0.5 mL  0.5 mL Intramuscular Tomorrow-1000 Lewie Chamber, MD       iohexol (OMNIPAQUE) 9 MG/ML oral solution            ketoconazole (NIZORAL) 2 % shampoo   Topical QODAY Danford Bad, RPH   Given at 04/06/22 8119   levETIRAcetam (KEPPRA) tablet 500 mg  500 mg Oral BID Lewie Chamber, MD   500 mg at 04/07/22 1478   multivitamin with minerals tablet 1 tablet  1 tablet Oral Daily Lewie Chamber, MD   1 tablet at 04/07/22 0821   ondansetron (ZOFRAN) injection 4 mg  4 mg Intravenous Q6H PRN Dahal, Melina Schools, MD   4 mg at 04/07/22 1123   Oral care mouth rinse  15 mL Mouth Rinse PRN Lewie Chamber, MD       oxyCODONE (Oxy IR/ROXICODONE) immediate release tablet 5 mg  5 mg Oral Q4H PRN Lewie Chamber, MD   5 mg at 03/30/22 1423   polyethylene glycol (MIRALAX / GLYCOLAX) packet 17 g  17 g Oral Daily PRN Lorin Glass, MD   17 g at 04/06/22 1051   saccharomyces boulardii (FLORASTOR) capsule 500 mg  500 mg Oral QPM Dahal, Binaya, MD   500 mg at 04/06/22 1809   sodium chloride flush (NS) 0.9 % injection 3 mL  3 mL Intravenous Orlene Erm, MD   3 mL at 04/07/22 2956   thiamine (VITAMIN B1) tablet 100 mg  100 mg Oral Daily Lewie Chamber, MD   100 mg at 04/07/22 2130   triamcinolone cream (KENALOG) 0.1 % cream   Topical BID Lewie Chamber, MD   Given at 04/07/22 0824   vancomycin (VANCOREADY) IVPB 1250 mg/250 mL  1,250 mg Intravenous Daily Danford Bad, RPH 166.7 mL/hr at 04/07/22 0543 1,250 mg at 04/07/22 0543     Discharge Medications: Please see discharge summary for a list of discharge medications.  Relevant Imaging Results:  Relevant Lab Results:   Additional Information SSN: [id number]  Lavenia Atlas, RN

## 2022-04-07 NOTE — Progress Notes (Signed)
[name] PROGRESS NOTE  Ashley Bradley  DOB: 1950-11-26  PCP: Ashley Pilon, FNP [id number]  DOA: 03/22/2022  LOS: 8 days  Hospital Day: 9  Brief narrative: Ashley Bradley is a 71 y.o. female with PMH significant for cerebral palsy with seizure disorder, alcohol abuse with alcohol withdrawal seizure, depression, HTN, HLD, prolonged QTc, history of C. Difficile 9/19, patient presented to the ER with wanting to detox from alcohol. Her son passed away the end of Jan 05, 2023 and she states she has been in severe depression and has been in bed since then with continuous drinking daily.  She states that she drinks approximately 10-12 Coors light per day.  She orders beer via delivery.  Her husband is sober and does not drink in the house. She also has not showered since Jan 05, 2023.  She states that she has stayed in bed since then and wears diapers which her husband changes. She has been compliant on her Prozac and Keppra.  She states it has been about 2 to 3 years since her last seizure which was associated with alcohol withdrawal at that time.   In the ED, she was tachycardic to 130s and was in alcohol withdrawal. On exam, she was noted to have diffuse rash all over her body which is worse on her back. She has no lesions in her mouth.   The rash was discussed with [facility] dermatology however without adequate examination in person, they were unable to provide significant recommendations.  They did not recommend urgent transfer.  Patient also did not wish to transfer.  Bed availability of course also unavailable beds at [facility].   Patient was admitted to [facility] for alcohol withdrawal and ongoing monitoring of rash as well as suspicion of severe depression.   She endorsed no suicidal ideation.   Subjective: Patient was seen and examined this morning. Lying on bed.  Not in distress.  Patient continues to have fever spikes overnight.  Assessment and plan: # Fever For the last 72 hours, patient has multiple  spikes of fever as high as 102.9.  In last 24 hours, Tmax 100.7. Urinalysis 10/3 unremarkable.  Chest x-ray unremarkable. Blood culture sent yesterday 10/4.  Currently on empiric antibiotic coverage with IV cefepime and IV vancomycin based which she is having fever spikes. We will obtain a CT chest, abdomen pelvis with contrast to look for any occult abscess. Continue monitor the trend with antibiotics. Recent Labs  Lab 04/05/22 1201 04/06/22 0940 04/07/22 0426  WBC 3.6* 3.7* 3.5*    Chronic alcoholism  Alcohol withdrawal symptoms Initially admitted for alcohol withdrawal symptoms.  Patient apparently was drinking heavily since her son expired in 01-05-2023.  She presented to the ED wanting to be detoxed. Last drink in the morning of 03/22/2022 Was monitored for withdrawal symptoms with CIWA protocol.  Treated with a scheduled Valium 5 mg twice daily as well as as needed Ativan.  She did not have significant withdrawal symptoms. Valium and Ativan has been stopped.  Mental status much better. Continue folate, multivitamin, thiamine  Hypokalemia Potassium level improved with replacement. Recent Labs  Lab 04/05/22 1201 04/06/22 0940 04/07/22 0426  K 3.7 3.9 4.3  MG 2.0  --  1.9  PHOS  --   --  4.1   Dermatitis Likely because of bedridden status.  Apparently, she has not showered or been out of bed since end of Jan 05, 2023 and wearing diapers for voiding and have [facility] ER did discuss with [facility] derm (Dr. Nolon Bradley) but  no pics able to be reviewed; their initial rec was to consider triamcinolone or silvadene cream. Much of her rash looks like seborrheic and/or irritant contact dermatitis especially since it's worse on her back which if she's been laying in bed for 3 months would seem expected; scalp also has seborrheic appearance and very neglected hair Started on ketoconazole shampoo every other day to hair/scalp Please bathe patient daily Continue triamcinolone 0.025% (low potency) cream and  ketoconazole cream to body affected with rash (chest, back, arms, legs, etc).  Also added calamine lotion.   Major depression Reports compliance to Prozac.   Denies suicidal ideation but patient grossly depressed since her son's death the end of 12-21-22.  She has been laying in bed ordering delivery of beer and has not showered since 12/21/2022 either. Continue Prozac Psychiatry consult offered.  Patient refused and wanted to keep the follow-up plan with her counselor as before.   Essential hypertension Losartan remain on hold.  Blood pressure normal.  Resume when blood pressure rises.  Physical deconditioning bed bound since end of 12-21-22 PT eval obtained.  SNF recommended.  History of seizure disorder Patient reports that her last withdrawal seizure was about 2 to 3 years ago. Continue Keppra   Spastic hemiplegic cerebral palsy  Right lower extremity noted with increased tone and decreased flexibility Continue Keppra  Goals of care   Code Status: Full Code   Mobility: PT eval obtained.  SNF recommended  Skin assessment: Generalized rashes improving.  Nutritional status:  Body mass index is 24.07 kg/m.          Diet:  Diet Order             Diet regular Room service appropriate? No; Fluid consistency: Thin  Diet effective now                   DVT prophylaxis:  enoxaparin (LOVENOX) injection 40 mg Start: 03/22/22 2215   Antimicrobials: IV cefepime, IV vancomycin topical steroid, oral fluconazole Fluid: Not IV fluid Consultants: None Family Communication: Husband at bedside   Status is: Inpatient  Continue inhospital care because: Continues to spike fever.   Level of care: Med-Surg   Dispo: The patient is from: Home              Anticipated d/c is to: Pending clinical course              Patient currently is not medically stable to d/c.   Difficult to place patient No     Infusions:   ceFEPime (MAXIPIME) IV 2 g (04/07/22 1012)   vancomycin 1,250 mg  (04/07/22 0543)     Scheduled Meds:  calamine  1 Application Topical TID   enoxaparin (LOVENOX) injection  40 mg Subcutaneous Q24H   fluconazole  100 mg Oral Daily   FLUoxetine  40 mg Oral q morning   folic acid  1 mg Oral Daily   influenza vaccine adjuvanted  0.5 mL Intramuscular Tomorrow-1000   iohexol       ketoconazole   Topical QODAY   levETIRAcetam  500 mg Oral BID   multivitamin with minerals  1 tablet Oral Daily   saccharomyces boulardii  500 mg Oral QPM   sodium chloride flush  3 mL Intravenous Q12H   thiamine  100 mg Oral Daily   triamcinolone cream   Topical BID    PRN meds: acetaminophen **OR** acetaminophen, hydrOXYzine, iohexol, ondansetron (ZOFRAN) IV, mouth rinse, oxyCODONE, polyethylene glycol   Antimicrobials: Anti-infectives (  From admission, onward)    Start     Dose/Rate Route Frequency Ordered Stop   04/06/22 1000  vancomycin (VANCOREADY) IVPB 1250 mg/250 mL  Status:  Discontinued        1,250 mg 166.7 mL/hr over 90 Minutes Intravenous Daily 04/06/22 0900 04/06/22 0904   04/06/22 1000  vancomycin (VANCOREADY) IVPB 1250 mg/250 mL        1,250 mg 166.7 mL/hr over 90 Minutes Intravenous Daily 04/06/22 0904     04/06/22 0930  ceFEPIme (MAXIPIME) 2 g in sodium chloride 0.9 % 100 mL IVPB        2 g 200 mL/hr over 30 Minutes Intravenous 2 times daily 04/06/22 0900     03/26/22 1300  fluconazole (DIFLUCAN) tablet 100 mg        100 mg Oral Daily 03/26/22 1203         Objective: Vitals:   04/06/22 2102 04/07/22 0324  BP: 122/70 127/77  Pulse: 84 92  Resp: 18 16  Temp: (!) 100.4 F (38 C) (!) 100.7 F (38.2 C)  SpO2: 94% 95%    Intake/Output Summary (Last 24 hours) at 04/07/2022 1057 Last data filed at 04/07/2022 0600 Gross per 24 hour  Intake 1034.95 ml  Output 950 ml  Net 84.95 ml   Filed Weights   03/22/22 2118 04/05/22 1014  Weight: 65.6 kg 65.6 kg   Weight change:  Body mass index is 24.07 kg/m.   Physical Exam: General exam:  Pleasant elderly Caucasian female.  Not in physical distress. Skin: Generalized very pruritic skin rash improving. HEENT: Atraumatic, normocephalic, no obvious bleeding Lungs: Clear to auscultation bilaterally CVS: Regular rate and rhythm, no murmur GI/Abd soft, nontender, nondistended, bowel sound present CNS: Alert, awake, oriented x3.  Improved tremors in both sides Psychiatry: Mood appropriate Extremities: No pedal edema, no calf tenderness  Data Review: I have personally reviewed the laboratory data and studies available.  F/u labs ordered Unresulted Labs (From admission, onward)     Start     Ordered   04/07/22 0500  Basic metabolic panel  Daily at 5am,   R     Question:  Specimen collection method  Answer:  Lab=Lab collect   04/06/22 0806   04/07/22 0500  CBC with Differential/Platelet  Daily at 5am,   R     Question:  Specimen collection method  Answer:  Lab=Lab collect   04/06/22 0806   04/07/22 0500  Creatinine, serum  Every 48 hours,   R     Question:  Specimen collection method  Answer:  Lab=Lab collect   04/06/22 0900            Signed, Lorin Glass, MD [facility] 04/07/2022.

## 2022-04-07 NOTE — TOC Progression Note (Addendum)
Transition of Care [facility]) - Progression Note    Patient Details  Name: Ashley Bradley MRN: [id number] Date of Birth: 1951-04-19  Transition of Care [facility]) CM/SW [place], RN Phone Number: 04/07/2022, 12:14 PM  Clinical Narrative:   PASRR requested a new FL2, will send new signed FL2 once available. TOC will continue to follow  -2:15p Resubmitted signed FL2 to [name] awaiting outcome of PASRR review  Expected Discharge Plan: [place] Barriers to Discharge: Continued Medical Work up  Expected Discharge Plan and Services Expected Discharge Plan: [place]   Discharge Planning Services: CM Consult                                           Social Determinants of Health (SDOH) Interventions    Readmission Risk Interventions     No data to display

## 2022-04-07 NOTE — Progress Notes (Signed)
Physical Therapy Treatment Patient Details Name: Ashley Bradley MRN: [id number] DOB: July 01, 1951 Today's Date: 04/07/2022   History of Present Illness Pt is a 71yo female presenting to [facility] ED on 9/19 via EMS reporting ETOH binge x6weeks with N/V/D and body rash; reporting lying in bed with husband changing diapers. 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    PT Comments    Pt agreeable to therapy this morning. Pt stated she is having a CT later this morning so she has been drinking the contrast. Pt completed bed exercises with min assist. Instructed her to complete them 2x a day on her own. Pt was min assist supine to sit with verbal cues. Pt sat EOB with supervision for balance. Pt was able to stand with the steady at the bedside with +2 min assist focusing on standing tolerance and balance. Pt c/o some increased nausea and requested back to bed. Pt is making slow but steady progress with mobility. Recommend continued acute skilled PT to maximize mobility and Independence for the next venue of care.  Recommendations for follow up therapy are one component of a multi-disciplinary discharge planning process, led by the attending physician.  Recommendations may be updated based on patient status, additional functional criteria and insurance authorization.  Follow Up Recommendations  Skilled nursing-short term rehab (<3 hours/day) Can patient physically be transported by private vehicle: No   Assistance Recommended at Discharge Frequent or constant Supervision/Assistance  Patient can return home with the following Two people to help with walking and/or transfers;A lot of help with bathing/dressing/bathroom;Assist for transportation;Help with stairs or ramp for entrance;Assistance with cooking/housework   Equipment Recommendations  None recommended by PT    Recommendations for Other Services       Precautions / Restrictions Precautions Precautions: Fall Precaution  Comments: R hemiparesis secondary to CP, rash Restrictions Weight Bearing Restrictions: No     Mobility  Bed Mobility Overal bed mobility: Needs Assistance     Sidelying to sit: Min assist, HOB elevated Supine to sit: Min assist, HOB elevated Sit to supine: HOB elevated, +2 for physical assistance, Mod assist   General bed mobility comments: Pt c/o nausea after standing with the steady. Pt returned supine in bed with +2 assist.    Transfers Overall transfer level: Needs assistance Equipment used: Ambulation equipment used Transfers: Sit to/from Stand Sit to Stand: Min assist, +2 safety/equipment           General transfer comment: pt stood with the steady- min assist to stand, cues for upright posture. Focused on weight shifting and standing tolerance. Performed sit to stand on steady x2 for strengthening. Pt c/o nausea and back pain requesting she return to supine in the bed.    Ambulation/Gait                   Stairs             Wheelchair Mobility    Modified Rankin (Stroke Patients Only)       Balance Overall balance assessment: Needs assistance Sitting-balance support: No upper extremity supported, Feet supported Sitting balance-Leahy Scale: Fair     Standing balance support: Bilateral upper extremity supported, Reliant on assistive device for balance Standing balance-Leahy Scale: Poor Standing balance comment: min assist with steady                            Cognition Arousal/Alertness: Awake/alert Behavior During Therapy: [facility] for  tasks assessed/performed Overall Cognitive Status: Within Functional Limits for tasks assessed                         Following Commands: Follows one step commands consistently, Follows multi-step commands with increased time     Problem Solving: Slow processing, Requires verbal cues, Requires tactile cues          Exercises General Exercises - Lower Extremity Ankle  Circles/Pumps: AROM, AAROM, Strengthening, Both, 10 reps, Supine Heel Slides: AROM, AAROM, Strengthening, Both, 10 reps, Supine Hip ABduction/ADduction: AROM, AAROM, Strengthening, Both, 10 reps, Supine    General Comments        Pertinent Vitals/Pain Pain Assessment Pain Assessment: Faces Faces Pain Scale: Hurts little more Pain Location: back Pain Descriptors / Indicators: Aching, Discomfort, Guarding Pain Intervention(s): Limited activity within patient's tolerance, Monitored during session, Repositioned    Home Living                          Prior Function            PT Goals (current goals can now be found in the care plan section) Progress towards PT goals: Progressing toward goals    Frequency    Min 2X/week      PT Plan Current plan remains appropriate    Co-evaluation PT/OT/SLP Co-Evaluation/Treatment: Yes Reason for Co-Treatment: Complexity of the patient's impairments (multi-system involvement);To address functional/ADL transfers;For patient/therapist safety PT goals addressed during session: Mobility/safety with mobility;Balance;Strengthening/ROM;Proper use of DME        AM-PAC PT "6 Clicks" Mobility   Outcome Measure  Help needed turning from your back to your side while in a flat bed without using bedrails?: A Little Help needed moving from lying on your back to sitting on the side of a flat bed without using bedrails?: A Little Help needed moving to and from a bed to a chair (including a wheelchair)?: A Lot Help needed standing up from a chair using your arms (e.g., wheelchair or bedside chair)?: Total Help needed to walk in hospital room?: Total Help needed climbing 3-5 steps with a railing? : Total 6 Click Score: 11    End of Session Equipment Utilized During Treatment: Gait belt Activity Tolerance: Patient limited by pain;Other (comment) (increased nausea) Patient left: in bed;with call bell/phone within reach;with bed alarm  set Nurse Communication: Mobility status PT Visit Diagnosis: Other abnormalities of gait and mobility (R26.89);Muscle weakness (generalized) (M62.81)     Time: 1040-1109 PT Time Calculation (min) (ACUTE ONLY): 29 min  Charges:  $Therapeutic Activity: 8-22 mins                       Lelon Mast 04/07/2022, 11:24 AM

## 2022-04-07 NOTE — Progress Notes (Signed)
Occupational Therapy Treatment Patient Details Name: Ashley Bradley MRN: [id number] DOB: 06/02/1951 Today's Date: 04/07/2022   History of present illness Pt is a 71yo female presenting to [facility] ED on 9/19 via EMS reporting ETOH binge x6weeks with N/V/D and body rash; reporting lying in bed with husband changing diapers. 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   OT comments  Pt progressing towards OT goals today. Limited by nausea caused by CT contras. Max A to don LB dressing, min guard for improved balanced sitting EOB, multiple sit<>stands in stedy with min A +2, focused on weight shifting, standing tolerance for ADL. OT will continue to follow acutely, POC remains appropriate SNF essential to maximize safety and independence in ADL and functional transfers.    Recommendations for follow up therapy are one component of a multi-disciplinary discharge planning process, led by the attending physician.  Recommendations may be updated based on patient status, additional functional criteria and insurance authorization.    Follow Up Recommendations  Skilled nursing-short term rehab (<3 hours/day)    Assistance Recommended at Discharge Frequent or constant Supervision/Assistance  Patient can return home with the following  Two people to help with bathing/dressing/bathroom;Two people to help with walking and/or transfers;Assistance with cooking/housework;Direct supervision/assist for medications management;Assist for transportation;Help with stairs or ramp for entrance;Direct supervision/assist for financial management;Assistance with feeding   Equipment Recommendations  Other (comment) (defer)    Recommendations for Other Services      Precautions / Restrictions Precautions Precautions: Fall Precaution Comments: R hemiparesis secondary to CP, rash Restrictions Weight Bearing Restrictions: No       Mobility Bed Mobility Overal bed mobility: Needs Assistance Bed  Mobility: Rolling, Sidelying to Sit Rolling: Min guard Sidelying to sit: Min assist, HOB elevated Supine to sit: Min assist, HOB elevated Sit to supine: HOB elevated, +2 for physical assistance, Mod assist Sit to sidelying: Max assist General bed mobility comments: Pt c/o nausea after standing with the steady. Pt returned supine in bed with +2 assist.    Transfers Overall transfer level: Needs assistance Equipment used: Ambulation equipment used Transfers: Sit to/from Stand Sit to Stand: Min assist, +2 safety/equipment           General transfer comment: pt stood with the steady- min assist to stand, cues for upright posture. Focused on weight shifting and standing tolerance. Performed sit to stand on steady x2 for strengthening. Pt c/o nausea and back pain requesting she return to supine in the bed. Transfer via Lift Equipment: Stedy   Balance Overall balance assessment: Needs assistance Sitting-balance support: No upper extremity supported, Feet supported Sitting balance-Leahy Scale: Fair Sitting balance - Comments: preference for L lateral lean, requiring at least min PT assist to correct   Standing balance support: Bilateral upper extremity supported, Reliant on assistive device for balance Standing balance-Leahy Scale: Poor Standing balance comment: min assist with steady                           ADL either performed or assessed with clinical judgement   ADL Overall ADL's : Needs assistance/impaired     Grooming: Min guard;Wash/dry face;Sitting Grooming Details (indicate cue type and reason): supported sitting EOB             Lower Body Dressing: Bed level;Maximal assistance Lower Body Dressing Details (indicate cue type and reason): to don socks, Pt able to lift legs off mattress for donning socks Toilet Transfer: Minimal assistance;+2  for physical assistance;+2 for safety/equipment (sit to stand in stedy)   Toileting- Water quality scientist and  Hygiene: +2 for physical assistance;Moderate assistance;+2 for safety/equipment;Sit to/from stand       Functional mobility during ADLs: +2 for physical assistance;+2 for safety/equipment;Minimal assistance (stedy) General ADL Comments: nausea due to CT dye in upright position. decreased activity tolerance    Extremity/Trunk Assessment Upper Extremity Assessment Upper Extremity Assessment: Generalized weakness   Lower Extremity Assessment Lower Extremity Assessment: Defer to PT evaluation        Vision       Perception     Praxis      Cognition Arousal/Alertness: Awake/alert Behavior During Therapy: WFL for tasks assessed/performed Overall Cognitive Status: Within Functional Limits for tasks assessed                                          Exercises      Shoulder Instructions       General Comments      Pertinent Vitals/ Pain       Pain Assessment Pain Assessment: Faces Faces Pain Scale: Hurts little more Pain Location: back Pain Descriptors / Indicators: Aching, Discomfort, Guarding Pain Intervention(s): Monitored during session, Repositioned  Home Living                                          Prior Functioning/Environment              Frequency  Min 2X/week        Progress Toward Goals  OT Goals(current goals can now be found in the care plan section)  Progress towards OT goals: Progressing toward goals  Acute Rehab OT Goals Patient Stated Goal: get stronger, get to rehab OT Goal Formulation: With patient Time For Goal Achievement: 04/10/22 Potential to Achieve Goals: Good  Plan Discharge plan remains appropriate    Co-evaluation      Reason for Co-Treatment: Complexity of the patient's impairments (multi-system involvement);To address functional/ADL transfers;For patient/therapist safety PT goals addressed during session: Mobility/safety with mobility;Balance;Strengthening/ROM;Proper use of DME         AM-PAC OT "6 Clicks" Daily Activity     Outcome Measure   Help from another person eating meals?: A Little Help from another person taking care of personal grooming?: A Little Help from another person toileting, which includes using toliet, bedpan, or urinal?: A Lot Help from another person bathing (including washing, rinsing, drying)?: A Lot Help from another person to put on and taking off regular upper body clothing?: A Little Help from another person to put on and taking off regular lower body clothing?: A Lot 6 Click Score: 15    End of Session Equipment Utilized During Treatment: Other (comment) (stedy)  OT Visit Diagnosis: Muscle weakness (generalized) (M62.81)   Activity Tolerance Patient tolerated treatment well   Patient Left in bed;with call bell/phone within reach;with bed alarm set   Nurse Communication Mobility status        Time: 1040-1109 OT Time Calculation (min): 29 min  Charges: OT General Charges $OT Visit: 1 Visit OT Treatments $Therapeutic Activity: 8-22 mins  Jesse Sans OTR/L Acute Rehabilitation Services Office: [place] 04/07/2022, 12:37 PM

## 2022-04-07 NOTE — Care Management Important Message (Signed)
Important Message  Patient Details IM Letter given to the Patient. Name: Ashley Bradley MRN: [id number] Date of Birth: [birth year]   Medicare Important Message Given:  Yes     Kerin Salen 04/07/2022, 9:34 AM

## 2022-04-08 DIAGNOSIS — F10939 Alcohol use, unspecified with withdrawal, unspecified: Secondary | ICD-10-CM | POA: Diagnosis not present

## 2022-04-08 LAB — CBC WITH DIFFERENTIAL/PLATELET
Abs Immature Granulocytes: 0.01 10*3/uL (ref 0.00–0.07)
Basophils Absolute: 0 10*3/uL (ref 0.0–0.1)
Basophils Relative: 1 %
Eosinophils Absolute: 0.1 10*3/uL (ref 0.0–0.5)
Eosinophils Relative: 2 %
HCT: 41.1 % (ref 36.0–46.0)
Hemoglobin: 13.1 g/dL (ref 12.0–15.0)
Immature Granulocytes: 0 %
Lymphocytes Relative: 54 %
Lymphs Abs: 1.5 10*3/uL (ref 0.7–4.0)
MCH: 32.3 pg (ref 26.0–34.0)
MCHC: 31.9 g/dL (ref 30.0–36.0)
MCV: 101.2 fL — ABNORMAL HIGH (ref 80.0–100.0)
Monocytes Absolute: 0.5 10*3/uL (ref 0.1–1.0)
Monocytes Relative: 18 %
Neutro Abs: 0.7 10*3/uL — ABNORMAL LOW (ref 1.7–7.7)
Neutrophils Relative %: 25 %
Platelets: 524 10*3/uL — ABNORMAL HIGH (ref 150–400)
RBC: 4.06 MIL/uL (ref 3.87–5.11)
RDW: 12.7 % (ref 11.5–15.5)
WBC: 2.9 10*3/uL — ABNORMAL LOW (ref 4.0–10.5)
nRBC: 0 % (ref 0.0–0.2)

## 2022-04-08 LAB — BASIC METABOLIC PANEL
Anion gap: 8 (ref 5–15)
BUN: 9 mg/dL (ref 8–23)
CO2: 25 mmol/L (ref 22–32)
Calcium: 8.8 mg/dL — ABNORMAL LOW (ref 8.9–10.3)
Chloride: 103 mmol/L (ref 98–111)
Creatinine, Ser: 0.56 mg/dL (ref 0.44–1.00)
GFR, Estimated: >60 mL/min (ref >60)
Glucose, Bld: 92 mg/dL (ref 70–99)
Potassium: 4.4 mmol/L (ref 3.5–5.1)
Sodium: 136 mmol/L (ref 135–145)

## 2022-04-08 MED ORDER — SODIUM CHLORIDE 0.9 % IV SOLN
1.0000 g | INTRAVENOUS | Status: DC
  Administered 2022-04-08 – 2022-04-10 (×3): 1 g via INTRAVENOUS
  Filled 2022-04-08 (×3): qty 10

## 2022-04-08 NOTE — Progress Notes (Signed)
RN asked patient on multiple occasions since shift change if she would like to get out of bed, and patient responds, "I don't want to."  When asked why, patient responded, "I feel depressed."  Patient agreeable to sit on side of bed for dinner, says she will get OOB tomorrow when PT/OT sees her.  Will continue encouraging patient to ambulate and get OOB.  Angie Fava, RN

## 2022-04-08 NOTE — Progress Notes (Signed)
Ashley Bradley PROGRESS NOTE  Vevelyn Bradley  DOB: [birth year]  PCP: Kristen Loader, FNP ZOX:[id number]  DOA: 03/22/2022  LOS: 8 days  Hospital Day: 9  Brief narrative: Ashley Bradley is a 71 y.o. female with PMH significant for cerebral palsy with seizure disorder, alcohol abuse with alcohol withdrawal seizure, depression, HTN, HLD, prolonged QTc, history of C. Difficile 9/19, patient presented to the ER with wanting to detox from alcohol. Her son passed away the end of 12-08-2022 and she states she has been in severe depression and has been in bed since then with continuous drinking daily.  She states that she drinks approximately 10-12 [employer] light per day.  She orders beer via delivery.  Her husband is sober and does not drink in the house. She also has not showered since December 08, 2022.  She states that she has stayed in bed since then and wears diapers which her husband changes. She has been compliant on her Prozac and Keppra.  She states it has been about 2 to 3 years since her last seizure which was associated with alcohol withdrawal at that time.   In the ED, she was tachycardic to 130s and was in alcohol withdrawal. On exam, she was noted to have diffuse rash all over her body which is worse on her back. She has no lesions in her mouth.   The rash was discussed with [facility] dermatology however without adequate examination in person, they were unable to provide significant recommendations.  They did not recommend urgent transfer.  Patient also did not wish to transfer.  Bed availability of course also unavailable beds at [facility].   Patient was admitted to [facility] for alcohol withdrawal and ongoing monitoring of rash as well as suspicion of severe depression.   She endorsed no suicidal ideation.   Subjective: Patient was seen and examined this morning. Propped up in bed.  Not in distress.  Fever spikes improved.  No fever in last 24 hours.  Assessment and plan: # Fever Improved.  Unclear etiology. Blood culture  sent on 10/14 also any growth.  Initially started on  empiric antibiotic coverage with IV cefepime and IV vancomycin.  CT chest, abdomen pelvis obtained on 10/5 did not show any occult source of infection.  In the currently on broad-spectrum antibiotics.  No fever spikes in last 24 hours.  I switched antibiotics to IV Rocephin this morning.  Continue to monitor. Recent Labs  Lab 04/05/22 1201 04/06/22 0940 04/07/22 0426 04/08/22 0414  WBC 3.6* 3.7* 3.5* 2.9*     Chronic alcoholism  Alcohol withdrawal symptoms Initially admitted for alcohol withdrawal symptoms.  Patient apparently was drinking heavily since her son expired in Dec 08, 2022.  She presented to the ED wanting to be detoxed. Last drink in the morning of 03/22/2022 Was monitored for withdrawal symptoms with CIWA protocol.  Treated with a scheduled Valium 5 mg twice daily as well as as needed Ativan.  She did not have significant withdrawal symptoms. Valium and Ativan has been stopped.  Mental status much better. Continue folate, multivitamin, thiamine  Hypokalemia Potassium level improved with replacement. Recent Labs  Lab 04/05/22 1201 04/06/22 0940 04/07/22 0426 04/08/22 0414  K 3.7 3.9 4.3 4.4  MG 2.0  --  1.9  --   PHOS  --   --  4.1  --     Dermatitis Likely because of bedridden status.  Apparently, she has not showered or been out of bed since end of Dec 08, 2022 and wearing diapers for  voiding and have [facility] ER did discuss with [facility] derm (Dr. Denese Killings) but no pics able to be reviewed; their initial rec was to consider triamcinolone or silvadene cream. Much of her rash looks like seborrheic and/or irritant contact dermatitis especially since it's worse on her back which if she's been laying in bed for 3 months would seem expected; scalp also has seborrheic appearance and very neglected hair Started on ketoconazole shampoo every other day to hair/scalp Please bathe patient daily Continue triamcinolone 0.025% (low potency) cream and  ketoconazole cream to body affected with rash (chest, back, arms, legs, etc).  Also added calamine lotion.   Major depression Reports compliance to Prozac.   Denies suicidal ideation but patient grossly depressed since her son's death the end of 12-18-22.  She has been laying in bed ordering delivery of beer and has not showered since 2022/12/18 either. Continue Prozac Psychiatry consult offered.  Patient refused and wanted to keep the follow-up plan with her counselor as before.   Essential hypertension Losartan remain on hold.  Blood pressure normal.  Resume when blood pressure rises.  Physical deconditioning bed bound since end of 12-18-2022 PT eval obtained.  SNF recommended.  History of seizure disorder Patient reports that her last withdrawal seizure was about 2 to 3 years ago. Continue Keppra   Spastic hemiplegic cerebral palsy  Right lower extremity noted with increased tone and decreased flexibility Continue Keppra  Goals of care   Code Status: Full Code   Mobility: PT eval obtained.  SNF recommended  Skin assessment: Generalized rashes improving.  Nutritional status:  Body mass index is 24.07 kg/m.          Diet:  Diet Order             Diet regular Room service appropriate? No; Fluid consistency: Thin  Diet effective now                   DVT prophylaxis:  enoxaparin (LOVENOX) injection 40 mg Start: 03/22/22 2215   Antimicrobials: IV ceftriaxone, oral fluconazole Fluid: Not IV fluid Consultants: None Family Communication: Husband not at bedside today  Status is: Inpatient  Continue inhospital care because: Continues to spike fever.   Level of care: Med-Surg   Dispo: The patient is from: Home              Anticipated d/c is to: Pending clinical course              Patient currently is not medically stable to d/c.   Difficult to place patient No     Infusions:   cefTRIAXone (ROCEPHIN)  IV 1 g (04/08/22 1004)   vancomycin 1,250 mg (04/08/22 0536)      Scheduled Meds:  calamine  1 Application Topical TID   enoxaparin (LOVENOX) injection  40 mg Subcutaneous Q24H   fluconazole  100 mg Oral Daily   FLUoxetine  40 mg Oral q morning   folic acid  1 mg Oral Daily   influenza vaccine adjuvanted  0.5 mL Intramuscular Tomorrow-1000   ketoconazole   Topical QODAY   levETIRAcetam  500 mg Oral BID   multivitamin with minerals  1 tablet Oral Daily   saccharomyces boulardii  500 mg Oral QPM   sodium chloride flush  3 mL Intravenous Q12H   thiamine  100 mg Oral Daily   triamcinolone cream   Topical BID    PRN meds: acetaminophen **OR** acetaminophen, hydrOXYzine, ondansetron (ZOFRAN) IV, mouth rinse, oxyCODONE, polyethylene glycol  Antimicrobials: Anti-infectives (From admission, onward)    Start     Dose/Rate Route Frequency Ordered Stop   04/08/22 1000  cefTRIAXone (ROCEPHIN) 1 g in sodium chloride 0.9 % 100 mL IVPB        1 g 200 mL/hr over 30 Minutes Intravenous Every 24 hours 04/08/22 0813     04/06/22 1000  vancomycin (VANCOREADY) IVPB 1250 mg/250 mL  Status:  Discontinued        1,250 mg 166.7 mL/hr over 90 Minutes Intravenous Daily 04/06/22 0900 04/06/22 0904   04/06/22 1000  vancomycin (VANCOREADY) IVPB 1250 mg/250 mL        1,250 mg 166.7 mL/hr over 90 Minutes Intravenous Daily 04/06/22 0904     04/06/22 0930  ceFEPIme (MAXIPIME) 2 g in sodium chloride 0.9 % 100 mL IVPB  Status:  Discontinued        2 g 200 mL/hr over 30 Minutes Intravenous 2 times daily 04/06/22 0900 04/08/22 0813   03/26/22 1300  fluconazole (DIFLUCAN) tablet 100 mg        100 mg Oral Daily 03/26/22 1203         Objective: Vitals:   04/08/22 0418 04/08/22 1225  BP: 114/66 127/74  Pulse: 82 77  Resp: 20 18  Temp: 98.8 F (37.1 C) 97.8 F (36.6 C)  SpO2: 95% 92%    Intake/Output Summary (Last 24 hours) at 04/08/2022 1242 Last data filed at 04/08/2022 1206 Gross per 24 hour  Intake 570 ml  Output 3050 ml  Net -2480 ml    Filed Weights    03/22/22 2118 04/05/22 1014  Weight: 65.6 kg 65.6 kg   Weight change:  Body mass index is 24.07 kg/m.   Physical Exam: General exam: Pleasant elderly Caucasian female.  Not in physical distress. Skin: Generalized very pruritic skin rash improving. HEENT: Atraumatic, normocephalic, no obvious bleeding Lungs: Clear to auscultation bilaterally CVS: Regular rate and rhythm, no murmur GI/Abd soft, nontender, nondistended, bowel sound present CNS: Alert, awake, oriented x3.  Improved tremors in both sides Psychiatry: Mood appropriate Extremities: No pedal edema, no calf tenderness  Data Review: I have personally reviewed the laboratory data and studies available.  F/u labs ordered Unresulted Labs (From admission, onward)     Start     Ordered   04/07/22 [id number]  Basic metabolic panel  Daily at 5am,   R     Question:  Specimen collection method  Answer:  Lab=Lab collect   04/06/22 0806   04/07/22 0500  CBC with Differential/Platelet  Daily at 5am,   R     Question:  Specimen collection method  Answer:  Lab=Lab collect   04/06/22 0806            Signed, Terrilee Croak, MD [facility] 04/08/2022.

## 2022-04-09 DIAGNOSIS — F10930 Alcohol use, unspecified with withdrawal, uncomplicated: Secondary | ICD-10-CM | POA: Diagnosis not present

## 2022-04-09 LAB — CBC WITH DIFFERENTIAL/PLATELET
Abs Immature Granulocytes: 0.02 10*3/uL (ref 0.00–0.07)
Basophils Absolute: 0 10*3/uL (ref 0.0–0.1)
Basophils Relative: 1 %
Eosinophils Absolute: 0.1 10*3/uL (ref 0.0–0.5)
Eosinophils Relative: 3 %
HCT: 40 % (ref 36.0–46.0)
Hemoglobin: 13 g/dL (ref 12.0–15.0)
Immature Granulocytes: 1 %
Lymphocytes Relative: 56 %
Lymphs Abs: 1.8 10*3/uL (ref 0.7–4.0)
MCH: 32.3 pg (ref 26.0–34.0)
MCHC: 32.5 g/dL (ref 30.0–36.0)
MCV: 99.5 fL (ref 80.0–100.0)
Monocytes Absolute: 0.6 10*3/uL (ref 0.1–1.0)
Monocytes Relative: 18 %
Neutro Abs: 0.7 10*3/uL — ABNORMAL LOW (ref 1.7–7.7)
Neutrophils Relative %: 21 %
Platelets: 480 10*3/uL — ABNORMAL HIGH (ref 150–400)
RBC: 4.02 MIL/uL (ref 3.87–5.11)
RDW: 12.5 % (ref 11.5–15.5)
WBC Morphology: REACTIVE
WBC: 3.1 10*3/uL — ABNORMAL LOW (ref 4.0–10.5)
nRBC: 0 % (ref 0.0–0.2)

## 2022-04-09 LAB — BASIC METABOLIC PANEL
Anion gap: 5 (ref 5–15)
BUN: 11 mg/dL (ref 8–23)
CO2: 26 mmol/L (ref 22–32)
Calcium: 8.5 mg/dL — ABNORMAL LOW (ref 8.9–10.3)
Chloride: 106 mmol/L (ref 98–111)
Creatinine, Ser: 0.47 mg/dL (ref 0.44–1.00)
GFR, Estimated: >60 mL/min (ref >60)
Glucose, Bld: 96 mg/dL (ref 70–99)
Potassium: 4.3 mmol/L (ref 3.5–5.1)
Sodium: 137 mmol/L (ref 135–145)

## 2022-04-09 NOTE — Plan of Care (Signed)

## 2022-04-09 NOTE — Progress Notes (Signed)
[name] PROGRESS NOTE  [name]  DOB: 1951-01-05  PCP: Soundra Pilon, FNP FBP:[id number]  DOA: 03/22/2022  LOS: 8 days  Hospital Day: 9  Brief narrative: Ashley Bradley is a 71 y.o. female with PMH significant for cerebral palsy with seizure disorder, alcohol abuse with alcohol withdrawal seizure, depression, HTN, HLD, prolonged QTc, history of C. Difficile 9/19, patient presented to the ER with wanting to detox from alcohol. Her son passed away the end of Jan 01, 2023 and she states she has been in severe depression and has been in bed since then with continuous drinking daily.  She states that she drinks approximately 10-12 [employer] light per day.  She orders beer via delivery.  Her husband is sober and does not drink in the house. She also has not showered since 01/01/2023.  She states that she has stayed in bed since then and wears diapers which her husband changes. She has been compliant on her Prozac and Keppra.  She states it has been about 2 to 3 years since her last seizure which was associated with alcohol withdrawal at that time.   In the ED, she was tachycardic to 130s and was in alcohol withdrawal. On exam, she was noted to have diffuse rash all over her body which is worse on her back. She has no lesions in her mouth.   The rash was discussed with [facility] dermatology however without adequate examination in person, they were unable to provide significant recommendations.  They did not recommend urgent transfer.  Patient also did not wish to transfer.  Bed availability of course also unavailable beds at [facility].   Patient was admitted to [facility] for alcohol withdrawal and ongoing monitoring of rash as well as suspicion of severe depression.   She endorsed no suicidal ideation.   Subjective: Patient was seen and examined this morning. Lying on bed.  Not in distress no new symptoms. No fever last 24 hours.  Assessment and plan: # Fever Improved.  Unclear etiology. Blood culture sent on 10/14  also any growth.  Initially started on  empiric antibiotic coverage with IV cefepime and IV vancomycin.  CT chest, abdomen pelvis obtained on 10/5 did not show any occult source of infection.  Initially started on broad-spectrum antibiotics and later switched to IV Rocephin.  Continue to monitor. Recent Labs  Lab 04/05/22 1201 04/06/22 0940 04/07/22 0426 04/08/22 0414 04/09/22 0418  WBC 3.6* 3.7* 3.5* 2.9* 3.1*    Chronic alcoholism  Alcohol withdrawal symptoms Initially admitted for alcohol withdrawal symptoms.  Patient apparently was drinking heavily since her son expired in 2023/01/01.  She presented to the ED wanting to be detoxed. Last drink in the morning of 03/22/2022 Was monitored for withdrawal symptoms with CIWA protocol.  Treated with a scheduled Valium 5 mg twice daily as well as as needed Ativan.  She did not have significant withdrawal symptoms. Valium and Ativan has been stopped.  Mental status much better. Continue folate, multivitamin, thiamine  Hypokalemia Potassium level improved with replacement. Recent Labs  Lab 04/05/22 1201 04/06/22 0940 04/07/22 0426 04/08/22 0414 04/09/22 0418  K 3.7 3.9 4.3 4.4 4.3  MG 2.0  --  1.9  --   --   PHOS  --   --  4.1  --   --    Dermatitis Likely because of bedridden status.  Apparently, she has not showered or been out of bed since end of Jan 01, 2023 and wearing diapers for voiding and have [facility] ER did  discuss with [facility] derm (Dr. Nolon Bussing) but no pics able to be reviewed; their initial rec was to consider triamcinolone or silvadene cream. Much of her rash looks like seborrheic and/or irritant contact dermatitis especially since it's worse on her back which if she's been laying in bed for 3 months would seem expected; scalp also has seborrheic appearance and very neglected hair Started on ketoconazole shampoo every other day to hair/scalp Please bathe patient daily Continue triamcinolone 0.025% (low potency) cream and ketoconazole cream to  body affected with rash (chest, back, arms, legs, etc).  Also added calamine lotion.   Major depression Reports compliance to Prozac.   Denies suicidal ideation but patient grossly depressed since her son's death the end of January 07, 2023.  She has been laying in bed ordering delivery of beer and has not showered since 01/07/23 either. Continue Prozac Psychiatry consult offered.  Patient refused and wanted to keep the follow-up plan with her counselor as before.   Essential hypertension Losartan remain on hold.  Blood pressure normal.  Resume when blood pressure rises.  Physical deconditioning bed bound since end of 01-07-23 PT eval obtained.  SNF recommended.  History of seizure disorder Patient reports that her last withdrawal seizure was about 2 to 3 years ago. Continue Keppra   Spastic hemiplegic cerebral palsy  Right lower extremity noted with increased tone and decreased flexibility Continue Keppra  Goals of care   Code Status: Full Code   Mobility: PT eval obtained.  SNF recommended  Skin assessment: Generalized rashes improving.  Nutritional status:  Body mass index is 24.07 kg/m.          Diet:  Diet Order             Diet regular Room service appropriate? No; Fluid consistency: Thin  Diet effective now                   DVT prophylaxis:  enoxaparin (LOVENOX) injection 40 mg Start: 03/22/22 2215   Antimicrobials: IV ceftriaxone, oral fluconazole Fluid: Not IV fluid Consultants: None Family Communication: Husband not at bedside today  Status is: Inpatient  Continue inhospital care because: Continues to spike fever.   Level of care: Med-Surg   Dispo: The patient is from: Home              Anticipated d/c is to: Pending clinical course              Patient currently is not medically stable to d/c.   Difficult to place patient No     Infusions:   cefTRIAXone (ROCEPHIN)  IV 200 mL/hr at 04/09/22 1000     Scheduled Meds:  calamine  1 Application Topical  TID   enoxaparin (LOVENOX) injection  40 mg Subcutaneous Q24H   fluconazole  100 mg Oral Daily   FLUoxetine  40 mg Oral q morning   folic acid  1 mg Oral Daily   influenza vaccine adjuvanted  0.5 mL Intramuscular Tomorrow-1000   ketoconazole   Topical QODAY   levETIRAcetam  500 mg Oral BID   multivitamin with minerals  1 tablet Oral Daily   saccharomyces boulardii  500 mg Oral QPM   sodium chloride flush  3 mL Intravenous Q12H   thiamine  100 mg Oral Daily   triamcinolone cream   Topical BID    PRN meds: acetaminophen **OR** acetaminophen, hydrOXYzine, ondansetron (ZOFRAN) IV, mouth rinse, oxyCODONE, polyethylene glycol   Antimicrobials: Anti-infectives (From admission, onward)    Start  Dose/Rate Route Frequency Ordered Stop   04/08/22 1000  cefTRIAXone (ROCEPHIN) 1 g in sodium chloride 0.9 % 100 mL IVPB        1 g 200 mL/hr over 30 Minutes Intravenous Every 24 hours 04/08/22 0813     04/06/22 1000  vancomycin (VANCOREADY) IVPB 1250 mg/250 mL  Status:  Discontinued        1,250 mg 166.7 mL/hr over 90 Minutes Intravenous Daily 04/06/22 0900 04/06/22 0904   04/06/22 1000  vancomycin (VANCOREADY) IVPB 1250 mg/250 mL  Status:  Discontinued        1,250 mg 166.7 mL/hr over 90 Minutes Intravenous Daily 04/06/22 0904 04/08/22 1318   04/06/22 0930  ceFEPIme (MAXIPIME) 2 g in sodium chloride 0.9 % 100 mL IVPB  Status:  Discontinued        2 g 200 mL/hr over 30 Minutes Intravenous 2 times daily 04/06/22 0900 04/08/22 0813   03/26/22 1300  fluconazole (DIFLUCAN) tablet 100 mg        100 mg Oral Daily 03/26/22 1203         Objective: Vitals:   04/09/22 0900 04/09/22 1334  BP: 134/73 126/75  Pulse: 78 79  Resp: 18 18  Temp: 98.9 F (37.2 C) 98.4 F (36.9 C)  SpO2: 92% 96%    Intake/Output Summary (Last 24 hours) at 04/09/2022 1419 Last data filed at 04/09/2022 1000 Gross per 24 hour  Intake 627.21 ml  Output 850 ml  Net -222.79 ml   Filed Weights   03/22/22 2118  04/05/22 1014  Weight: 65.6 kg 65.6 kg   Weight change:  Body mass index is 24.07 kg/m.   Physical Exam: General exam: Pleasant elderly Caucasian female.  Not in physical distress. Skin: Generalized very pruritic skin rash improving. HEENT: Atraumatic, normocephalic, no obvious bleeding Lungs: Clear to auscultation bilaterally CVS: Regular rate and rhythm, no murmur GI/Abd soft, nontender, nondistended, bowel sound present CNS: Alert, awake, oriented x3.  Improved tremors in both sides Psychiatry: Mood appropriate Extremities: No pedal edema, no calf tenderness  Data Review: I have personally reviewed the laboratory data and studies available.  F/u labs ordered Unresulted Labs (From admission, onward)    None       Signed, Terrilee Croak, MD [facility] 04/09/2022.

## 2022-04-10 DIAGNOSIS — F10931 Alcohol use, unspecified with withdrawal delirium: Secondary | ICD-10-CM | POA: Diagnosis not present

## 2022-04-10 MED ORDER — CEFDINIR 300 MG PO CAPS
300.0000 mg | ORAL_CAPSULE | Freq: Two times a day (BID) | ORAL | Status: DC
  Administered 2022-04-10 – 2022-04-12 (×4): 300 mg via ORAL
  Filled 2022-04-10 (×4): qty 1

## 2022-04-10 NOTE — Progress Notes (Signed)
[name] PROGRESS NOTE  Ashley Bradley  DOB: 1951/01/20  PCP: Kristen Loader, FNP NAT:[id number]  DOA: 03/22/2022  LOS: 8 days  Hospital Day: 9  Brief narrative: Ashley Bradley is a 71 y.o. female with PMH significant for cerebral palsy with seizure disorder, alcohol abuse with alcohol withdrawal seizure, depression, HTN, HLD, prolonged QTc, history of C. Difficile 9/19, patient presented to the ER with wanting to detox from alcohol. Her son passed away the end of 01-Jan-2023 and she states she has been in severe depression and has been in bed since then with continuous drinking daily.  She states that she drinks approximately 10-12 [employer]ors light per day.  She orders beer via delivery.  Her husband is sober and does not drink in the house. She also has not showered since Jan 01, 2023.  She states that she has stayed in bed since then and wears diapers which her husband changes. She has been compliant on her Prozac and Keppra.  She states it has been about 2 to 3 years since her last seizure which was associated with alcohol withdrawal at that time.   In the ED, she was tachycardic to 130s and was in alcohol withdrawal. On exam, she was noted to have diffuse rash all over her body which is worse on her back. She has no lesions in her mouth.   The rash was discussed with [facility] dermatology however without adequate examination in person, they were unable to provide significant recommendations.  They did not recommend urgent transfer.  Patient also did not wish to transfer.  Bed availability of course also unavailable beds at [facility].   Patient was admitted to [facility] for alcohol withdrawal and ongoing monitoring of rash as well as suspicion of severe depression.   She endorsed no suicidal ideation.   Subjective: Patient was seen and examined this morning. Not in distress.  No new symptoms.  Pending placement.  Assessment and plan: # Fever Improved.  Unclear etiology. Blood culture sent on 10/14 also any growth.   Initially started on  empiric antibiotic coverage with IV cefepime and IV vancomycin.  CT chest, abdomen pelvis obtained on 10/5 did not show any occult source of infection.  Initially started on broad-spectrum antibiotics and later switched to IV Rocephin.  Switch to oral Omnicef for next 3 days. Recent Labs  Lab 04/05/22 1201 04/06/22 0940 04/07/22 0426 04/08/22 0414 04/09/22 0418  WBC 3.6* 3.7* 3.5* 2.9* 3.1*    Chronic alcoholism  Alcohol withdrawal symptoms Initially admitted for alcohol withdrawal symptoms.  Patient apparently was drinking heavily since her son expired in January 01, 2023.  She presented to the ED wanting to be detoxed. Last drink in the morning of 03/22/2022 Was monitored for withdrawal symptoms with CIWA protocol.  Treated with a scheduled Valium 5 mg twice daily as well as as needed Ativan.  She did not have significant withdrawal symptoms. Valium and Ativan has been stopped.  Mental status much better. Continue folate, multivitamin, thiamine  Hypokalemia Potassium level improved with replacement. Recent Labs  Lab 04/05/22 1201 04/06/22 0940 04/07/22 0426 04/08/22 0414 04/09/22 0418  K 3.7 3.9 4.3 4.4 4.3  MG 2.0  --  1.9  --   --   PHOS  --   --  4.1  --   --    Dermatitis Likely because of bedridden status.  Apparently, she has not showered or been out of bed since end of 01/01/2023 and wearing diapers for voiding and have [facility] ER did  discuss with [facility] derm (Dr. Nolon Bussing) but no pics able to be reviewed; their initial rec was to consider triamcinolone or silvadene cream. Much of her rash looks like seborrheic and/or irritant contact dermatitis especially since it's worse on her back which if she's been laying in bed for 3 months would seem expected; scalp also has seborrheic appearance and very neglected hair Started on ketoconazole shampoo every other day to hair/scalp Please bathe patient daily Continue triamcinolone 0.025% (low potency) cream and ketoconazole cream to  body affected with rash (chest, back, arms, legs, etc).  Also added calamine lotion. Completed 2 weeks course of oral fluconazole.   Major depression Reports compliance to Prozac.   Denies suicidal ideation but patient grossly depressed since her son's death the end of 06-Jan-2023.  She has been laying in bed ordering delivery of beer and has not showered since 06-Jan-2023 either. Continue Prozac Psychiatry consult offered.  Patient refused and wanted to keep the follow-up plan with her counselor as before.   Essential hypertension Losartan remain on hold.  Blood pressure normal.  Resume when blood pressure rises.  Physical deconditioning bed bound since end of 06-Jan-2023 PT eval obtained.  SNF recommended.  History of seizure disorder Patient reports that her last withdrawal seizure was about 2 to 3 years ago. Continue Keppra   Spastic hemiplegic cerebral palsy  Right lower extremity noted with increased tone and decreased flexibility Continue Keppra  Goals of care   Code Status: Full Code   Mobility: PT eval obtained.  SNF recommended  Skin assessment: Generalized rashes improving.  Nutritional status:  Body mass index is 24.07 kg/m.          Diet:  Diet Order             Diet regular Room service appropriate? No; Fluid consistency: Thin  Diet effective now                   DVT prophylaxis:  enoxaparin (LOVENOX) injection 40 mg Start: 03/22/22 2215   Antimicrobials: Oral Omnicef, oral fluconazole Fluid: Not IV fluid Consultants: None Family Communication: Family not at bedside today  Status is: Inpatient  Continue inhospital care because: Continues to spike fever.   Level of care: Med-Surg   Dispo: The patient is from: Home              Anticipated d/c is to: Pending clinical course              Patient currently is not medically stable to d/c.   Difficult to place patient No     Infusions:      Scheduled Meds:  calamine  1 Application Topical TID    cefdinir  300 mg Oral Q12H   enoxaparin (LOVENOX) injection  40 mg Subcutaneous Q24H   fluconazole  100 mg Oral Daily   FLUoxetine  40 mg Oral q morning   folic acid  1 mg Oral Daily   ketoconazole   Topical QODAY   levETIRAcetam  500 mg Oral BID   multivitamin with minerals  1 tablet Oral Daily   saccharomyces boulardii  500 mg Oral QPM   sodium chloride flush  3 mL Intravenous Q12H   thiamine  100 mg Oral Daily   triamcinolone cream   Topical BID    PRN meds: acetaminophen **OR** acetaminophen, hydrOXYzine, ondansetron (ZOFRAN) IV, mouth rinse, oxyCODONE, polyethylene glycol   Antimicrobials: Anti-infectives (From admission, onward)    Start     Dose/Rate Route Frequency  Ordered Stop   04/10/22 2200  cefdinir (OMNICEF) capsule 300 mg        300 mg Oral Every 12 hours 04/10/22 1622     04/08/22 1000  cefTRIAXone (ROCEPHIN) 1 g in sodium chloride 0.9 % 100 mL IVPB  Status:  Discontinued        1 g 200 mL/hr over 30 Minutes Intravenous Every 24 hours 04/08/22 0813 04/10/22 1622   04/06/22 1000  vancomycin (VANCOREADY) IVPB 1250 mg/250 mL  Status:  Discontinued        1,250 mg 166.7 mL/hr over 90 Minutes Intravenous Daily 04/06/22 0900 04/06/22 0904   04/06/22 1000  vancomycin (VANCOREADY) IVPB 1250 mg/250 mL  Status:  Discontinued        1,250 mg 166.7 mL/hr over 90 Minutes Intravenous Daily 04/06/22 0904 04/08/22 1318   04/06/22 0930  ceFEPIme (MAXIPIME) 2 g in sodium chloride 0.9 % 100 mL IVPB  Status:  Discontinued        2 g 200 mL/hr over 30 Minutes Intravenous 2 times daily 04/06/22 0900 04/08/22 0813   03/26/22 1300  fluconazole (DIFLUCAN) tablet 100 mg        100 mg Oral Daily 03/26/22 1203         Objective: Vitals:   04/10/22 0502 04/10/22 1358  BP: 115/75 121/70  Pulse: 78 78  Resp: 16 18  Temp: 98.7 F (37.1 C) 99.7 F (37.6 C)  SpO2: 92% 97%    Intake/Output Summary (Last 24 hours) at 04/10/2022 1622 Last data filed at 04/10/2022 1300 Gross per 24 hour   Intake 580 ml  Output 800 ml  Net -220 ml   Filed Weights   03/22/22 2118 04/05/22 1014  Weight: 65.6 kg 65.6 kg   Weight change:  Body mass index is 24.07 kg/m.   Physical Exam: General exam: Pleasant elderly Caucasian female.  Not in physical distress. Skin: Generalized very pruritic skin rash improving. HEENT: Atraumatic, normocephalic, no obvious bleeding Lungs: Clear to auscultation bilaterally CVS: Regular rate and rhythm, no murmur GI/Abd soft, nontender, nondistended, bowel sound present CNS: Alert, awake, oriented x3.  Improved tremors in both sides Psychiatry: Mood appropriate Extremities: No pedal edema, no calf tenderness  Data Review: I have personally reviewed the laboratory data and studies available.  F/u labs ordered Unresulted Labs (From admission, onward)    None       Signed, Lorin Glass, MD [facility] 04/10/2022.

## 2022-04-11 LAB — CULTURE, BLOOD (ROUTINE X 2)
Culture: NO GROWTH
Culture: NO GROWTH
Special Requests: ADEQUATE
Special Requests: ADEQUATE

## 2022-04-11 NOTE — Progress Notes (Signed)
[name] PROGRESS NOTE  [name]  DOB: 03-21-1951  PCP: Soundra Pilon, FNP ZYS:[id number]  DOA: 03/22/2022  LOS: 8 days  Hospital Day: 9  Brief narrative: Ashley Bradley is a 71 y.o. female with PMH significant for cerebral palsy with seizure disorder, alcohol abuse with alcohol withdrawal seizure, depression, HTN, HLD, prolonged QTc, history of C. Difficile 9/19, patient presented to the ER with wanting to detox from alcohol. Her son passed away the end of Jan 03, 2023 and she states she has been in severe depression and has been in bed since then with continuous drinking daily.  She states that she drinks approximately 10-12 Coors light per day.  She orders beer via delivery.  Her husband is sober and does not drink in the house. She also has not showered since 01-03-2023.  She states that she has stayed in bed since then and wears diapers which her husband changes. She has been compliant on her Prozac and Keppra.  She states it has been about 2 to 3 years since her last seizure which was associated with alcohol withdrawal at that time.   In the ED, she was tachycardic to 130s and was in alcohol withdrawal. On exam, she was noted to have diffuse rash all over her body which is worse on her back. She has no lesions in her mouth.   The rash was discussed with [facility] dermatology however without adequate examination in person, they were unable to provide significant recommendations.  They did not recommend urgent transfer.  Patient also did not wish to transfer.  Bed availability of course also unavailable beds at [facility].   Patient was admitted to [facility] for alcohol withdrawal and ongoing monitoring of rash as well as suspicion of severe depression.   She endorsed no suicidal ideation.   Subjective: Patient was seen and examined this morning. Not in distress.  No new symptoms.  Pending insurance approval for SNF.  Assessment and plan: # Fever Improved.  Unclear etiology. Blood culture sent on 10/14  also any growth.  Initially started on  empiric antibiotic coverage with IV cefepime and IV vancomycin.  CT chest, abdomen pelvis obtained on 10/5 did not show any occult source of infection.  Initially started on broad-spectrum antibiotics and later switched to IV Rocephin and subsequently to oral Omnicef. Recent Labs  Lab 04/05/22 1201 04/06/22 0940 04/07/22 0426 04/08/22 0414 04/09/22 0418  WBC 3.6* 3.7* 3.5* 2.9* 3.1*    Chronic alcoholism  Alcohol withdrawal symptoms Initially admitted for alcohol withdrawal symptoms.  Patient apparently was drinking heavily since her son expired in Jan 03, 2023.  She presented to the ED wanting to be detoxed. Was monitored for withdrawal symptoms with CIWA protocol.  Treated with a scheduled Valium 5 mg twice daily as well as as needed Ativan.  She did not have significant withdrawal symptoms. Valium and Ativan has been stopped.  Mental status much better. Continue folate, multivitamin, thiamine  Hypokalemia Potassium level improved with replacement. Recent Labs  Lab 04/05/22 1201 04/06/22 0940 04/07/22 0426 04/08/22 0414 04/09/22 0418  K 3.7 3.9 4.3 4.4 4.3  MG 2.0  --  1.9  --   --   PHOS  --   --  4.1  --   --    Dermatitis Likely because of bedridden status.  Apparently, she has not showered or been out of bed since end of Jan 03, 2023 and wearing diapers for voiding and have [facility] ER did discuss with [facility] derm (Dr. Nolon Bussing) but no  pics able to be reviewed; their initial rec was to consider triamcinolone or silvadene cream. Much of her rash looks like seborrheic and/or irritant contact dermatitis especially since it's worse on her back which if she's been laying in bed for 3 months would seem expected; scalp also has seborrheic appearance and very neglected hair Started on ketoconazole shampoo every other day to hair/scalp Please bathe patient daily Continue triamcinolone 0.025% (low potency) cream and ketoconazole cream to body affected with rash  (chest, back, arms, legs, etc).  Also added calamine lotion. Completed 2 weeks course of oral fluconazole.   Major depression Reports compliance to Prozac.   Denies suicidal ideation but patient grossly depressed since her son's death the end of 01-11-23.  She has been laying in bed ordering delivery of beer and has not showered since 01/11/2023 either. Continue Prozac Psychiatry consult offered.  Patient refused and wanted to keep the follow-up plan with her counselor as before.   Essential hypertension Losartan remain on hold.  Blood pressure normal.  Resume when blood pressure rises.  Physical deconditioning bed bound since end of January 11, 2023 PT eval obtained.  SNF recommended.  History of seizure disorder Patient reports that her last withdrawal seizure was about 2 to 3 years ago. Continue Keppra   Spastic hemiplegic cerebral palsy  Right lower extremity noted with increased tone and decreased flexibility Continue Keppra  Goals of care   Code Status: Full Code   Mobility: PT eval obtained.  SNF recommended  Skin assessment: Generalized rashes improving.  Nutritional status:  Body mass index is 24.07 kg/m.          Diet:  Diet Order             Diet regular Room service appropriate? No; Fluid consistency: Thin  Diet effective now                   DVT prophylaxis:  enoxaparin (LOVENOX) injection 40 mg Start: 03/22/22 2215   Antimicrobials: Oral Omnicef, oral fluconazole Fluid: Not IV fluid Consultants: None Family Communication: Husband at bedside today  Status is: Inpatient  Continue inhospital care because: Continues to spike fever.   Level of care: Med-Surg   Dispo: The patient is from: Home              Anticipated d/c is to: Pending clinical course              Patient currently is not medically stable to d/c.   Difficult to place patient No     Infusions:      Scheduled Meds:  calamine  1 Application Topical TID   cefdinir  300 mg Oral Q12H    enoxaparin (LOVENOX) injection  40 mg Subcutaneous Q24H   FLUoxetine  40 mg Oral q morning   folic acid  1 mg Oral Daily   ketoconazole   Topical QODAY   levETIRAcetam  500 mg Oral BID   multivitamin with minerals  1 tablet Oral Daily   saccharomyces boulardii  500 mg Oral QPM   sodium chloride flush  3 mL Intravenous Q12H   thiamine  100 mg Oral Daily   triamcinolone cream   Topical BID    PRN meds: acetaminophen **OR** acetaminophen, hydrOXYzine, ondansetron (ZOFRAN) IV, mouth rinse, oxyCODONE, polyethylene glycol   Antimicrobials: Anti-infectives (From admission, onward)    Start     Dose/Rate Route Frequency Ordered Stop   04/10/22 2200  cefdinir (OMNICEF) capsule 300 mg  300 mg Oral Every 12 hours 04/10/22 1622     04/08/22 1000  cefTRIAXone (ROCEPHIN) 1 g in sodium chloride 0.9 % 100 mL IVPB  Status:  Discontinued        1 g 200 mL/hr over 30 Minutes Intravenous Every 24 hours 04/08/22 0813 04/10/22 1622   04/06/22 1000  vancomycin (VANCOREADY) IVPB 1250 mg/250 mL  Status:  Discontinued        1,250 mg 166.7 mL/hr over 90 Minutes Intravenous Daily 04/06/22 0900 04/06/22 0904   04/06/22 1000  vancomycin (VANCOREADY) IVPB 1250 mg/250 mL  Status:  Discontinued        1,250 mg 166.7 mL/hr over 90 Minutes Intravenous Daily 04/06/22 0904 04/08/22 1318   04/06/22 0930  ceFEPIme (MAXIPIME) 2 g in sodium chloride 0.9 % 100 mL IVPB  Status:  Discontinued        2 g 200 mL/hr over 30 Minutes Intravenous 2 times daily 04/06/22 0900 04/08/22 0813   03/26/22 1300  fluconazole (DIFLUCAN) tablet 100 mg  Status:  Discontinued        100 mg Oral Daily 03/26/22 1203 04/10/22 1624       Objective: Vitals:   04/10/22 1358 04/10/22 2100  BP: 121/70 121/77  Pulse: 78 75  Resp: 18 18  Temp: 99.7 F (37.6 C) 98.1 F (36.7 C)  SpO2: 97% 94%    Intake/Output Summary (Last 24 hours) at 04/11/2022 1439 Last data filed at 04/11/2022 0900 Gross per 24 hour  Intake 360 ml  Output  1126 ml  Net -766 ml   Filed Weights   03/22/22 2118 04/05/22 1014  Weight: 65.6 kg 65.6 kg   Weight change:  Body mass index is 24.07 kg/m.   Physical Exam: General exam: Pleasant elderly Caucasian female.  Not in physical distress. Skin: Generalized very pruritic skin rash improving. HEENT: Atraumatic, normocephalic, no obvious bleeding Lungs: Clear to auscultation bilaterally CVS: Regular rate and rhythm, no murmur GI/Abd soft, nontender, nondistended, bowel sound present CNS: Alert, awake, oriented x3.  No alcohol related tremors Psychiatry: Mood appropriate Extremities: No pedal edema, no calf tenderness  Data Review: I have personally reviewed the laboratory data and studies available.  F/u labs ordered Unresulted Labs (From admission, onward)    None       Signed, Terrilee Croak, MD [facility] 04/11/2022.

## 2022-04-11 NOTE — TOC Progression Note (Addendum)
Transition of Care [facility]) - Progression Note    Patient Details  Name: Ashley Bradley MRN: [id number] Date of Birth: [birth year]  Transition of Care [facility]) CM/SW [place], RN Phone Number: 04/11/2022, 3:23 PM  Clinical Narrative:   Awaiting SNF insurace auth, still pending  TOC will continue to follow  -3:43p Notified patient via phone that we are awaiting her insurance to authorize her stay. TOC will continue to monitor.  -4:30pm uploaded additional clinical for auth review  Expected Discharge Plan: [place] Barriers to Discharge: Barriers Unresolved (comment) (Awaiting PASSR review for SNF placement)  Expected Discharge Plan and Services Expected Discharge Plan: [place] In-house Referral: NA Discharge Planning Services: CM Consult Post Acute Care Choice: [place] Living arrangements for the past 2 months: [place]                 DME Arranged: N/A DME Agency: NA       HH Arranged: NA HH Agency: NA         Social Determinants of Health (SDOH) Interventions    Readmission Risk Interventions     No data to display

## 2022-04-11 NOTE — Progress Notes (Signed)
Physical Therapy Treatment Patient Details Name: Ashley Bradley MRN: [id number] DOB: [birth year] Today's Date: 04/11/2022   History of Present Illness Pt is a 71yo female presenting to [facility] ED on 9/19 via EMS reporting ETOH binge x6weeks with N/V/D and body rash; reporting lying in bed with husband changing diapers. 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    PT Comments    Pt supine in bed in good spirits and agreeable to mobilize. Pt required min assist for bed mobility and min assist +2 for sit to stand transfer in [place]. Pt completed sit to stand exercises with increased time in standing and reaching outside of base of support with alternating Ues; pt able to stand staticly for 20s without BUE. Pt completed supine BUE and BLE exercises with good form; encouraged pt to complete 2x outside of therapy session today. Pt reporting she "feels like she is getting stronger." Pt incontinent of bowel during exercises so removed soiled linens and purewic and pt completed bilateral rolling in supine for pericare; RN notified. We will continue to follow acutely.     Recommendations for follow up therapy are one component of a multi-disciplinary discharge planning process, led by the attending physician.  Recommendations may be updated based on patient status, additional functional criteria and insurance authorization.  Follow Up Recommendations  Skilled nursing-short term rehab (<3 hours/day) Can patient physically be transported by private vehicle: No   Assistance Recommended at Discharge Frequent or constant Supervision/Assistance  Patient can return home with the following Two people to help with walking and/or transfers;A lot of help with bathing/dressing/bathroom;Assist for transportation;Help with stairs or ramp for entrance;Assistance with cooking/housework   Equipment Recommendations  None recommended by PT    Recommendations for Other Services       Precautions /  Restrictions Precautions Precautions: Fall Precaution Comments: R hemiparesis secondary to CP, rash Restrictions Weight Bearing Restrictions: No     Mobility  Bed Mobility Overal bed mobility: Needs Assistance Bed Mobility: Rolling, Sidelying to Sit, Sit to Supine Rolling: Min guard Sidelying to sit: Min assist, HOB elevated   Sit to supine: HOB elevated, Min assist   General bed mobility comments: Pt required min guard for rolling for pericare, no physical assist required. Sidelying to sit: min assist to elevate trunk with pt using LUE to ability. Min assist to scoot hips EOB. Sit to supine: min assist for bringing BLE into bed and gentle lowering of trunk. Pt able to self-scoot up in bed using bedrails and bridging technique with bed in [place].    Transfers Overall transfer level: Needs assistance Equipment used: Ambulation equipment used Transfers: Sit to/from Stand Sit to Stand: Min assist, +2 safety/equipment           General transfer comment: Pt utilized bar of [place] to stand with min assist +2 for safety and light lift assistance. Focused on weight shifting and standing tolerance by performing BUE reacching outside of BOS. Performed sit to stand on steady x3 for strengthening/ Transfer via Lift Equipment: [place]  Ambulation/Gait               General Gait Details: unable at this time   Social research officer, government [place] (Stroke Patients Only)       Balance Overall balance assessment: Needs assistance Sitting-balance support: No upper extremity supported, Feet supported Sitting balance-Leahy Scale: Fair Sitting balance - Comments: preference for  L lateral lean, requiring at least min PT assist to correct   Standing balance support: Bilateral upper extremity supported, Reliant on assistive device for balance Standing balance-Leahy Scale: Poor Standing balance comment: min assist with steady; pt able to stand without  BOS support fo 20s before reaching for Steady Bar.                            Cognition Arousal/Alertness: Awake/alert Behavior During Therapy: WFL for tasks assessed/performed Overall Cognitive Status: Within Functional Limits for tasks assessed Area of Impairment: Problem solving, Following commands                       Following Commands: Follows one step commands consistently, Follows multi-step commands with increased time     Problem Solving: Slow processing, Requires verbal cues, Requires tactile cues General Comments: Able to answer questions and follow commands. Pt pleasant, retired Engineer, civil (consulting).        Exercises General Exercises - Lower Extremity Ankle Circles/Pumps: AROM, Strengthening, Both, 10 reps, Supine Long Arc Quad: AROM, Both, 10 reps, Seated Heel Slides: AROM, Strengthening, Both, 10 reps, Supine Hip ABduction/ADduction: AROM, Strengthening, Both, 10 reps, Supine Hip Flexion/Marching: AROM, Both, 10 reps, Supine Shoulder Exercises Shoulder Flexion: AROM, Both, 10 reps, Seated Other Exercises Other Exercises: STS in steady with 30+ seconds standing reaching outside BOS laterally 3x10 BUE.    General Comments        Pertinent Vitals/Pain Pain Assessment Pain Assessment: 0-10 Pain Score: 4  Faces Pain Scale: Hurts little more Pain Location: back and b/l hips Pain Descriptors / Indicators: Aching, Discomfort, Guarding Pain Intervention(s): Monitored during session, Repositioned    Home Living                          Prior Function            PT Goals (current goals can now be found in the care plan section) Acute Rehab PT Goals Patient Stated Goal: back to walking PT Goal Formulation: With patient Time For Goal Achievement: 04/09/22 Potential to Achieve Goals: Good Progress towards PT goals: Progressing toward goals    Frequency    Min 2X/week      PT Plan Current plan remains appropriate    Co-evaluation               AM-PAC PT "6 Clicks" Mobility   Outcome Measure  Help needed turning from your back to your side while in a flat bed without using bedrails?: A Little Help needed moving from lying on your back to sitting on the side of a flat bed without using bedrails?: A Little Help needed moving to and from a bed to a chair (including a wheelchair)?: A Lot Help needed standing up from a chair using your arms (e.g., wheelchair or bedside chair)?: Total Help needed to walk in hospital room?: Total Help needed climbing 3-5 steps with a railing? : Total 6 Click Score: 11    End of Session Equipment Utilized During Treatment: Gait belt Activity Tolerance: Patient limited by fatigue Patient left: in bed;with call bell/phone within reach;with bed alarm set Nurse Communication: Mobility status PT Visit Diagnosis: Other abnormalities of gait and mobility (R26.89);Muscle weakness (generalized) (M62.81)     Time: 1000-1020 PT Time Calculation (min) (ACUTE ONLY): 20 min  Charges:  $Therapeutic Exercise: 8-22 mins  Coolidge Breeze, PT, DPT [place] [facility] Department Office: [phone] Weekend pager: [phone]   Coolidge Breeze 04/11/2022, 10:36 AM

## 2022-04-11 NOTE — TOC Transition Note (Addendum)
Transition of Care [facility]) - CM/SW Discharge Note   Patient Details  Name: Ashley Bradley MRN: [id number] Date of Birth: Apr 24, 1951  Transition of Care [facility]) CM/SW Contact:  Roseanne Kaufman, RN Phone Number: 04/11/2022, 10:23 AM   Clinical Narrative:   Cyndie Mull number [phone] E expires 05/07/2022 Previously offered  SNF choice to patient. Spoke with patient at bedside, who chose [facility]. Notified Teena with [facility] [facility] to confirm bed is till available.  Awaiting a call back will initiate insurance auth.   TOC will continue to follow.  - 12:49p Spoke with Raquel Sarna with [facility] who will review and confirm bed availability, awaiting auth.  TOC will continue to follow. - 1:19pm received call back from [facility], faxed over clinicals to [facility] for review. Awaiting insurance auth.   Barriers to Discharge: Barriers Unresolved (comment) (Awaiting PASSR review for SNF placement)   Patient Goals and CMS Choice Patient states their goals for this hospitalization and ongoing recovery are:: rehab at [facility] CMS Medicare.gov Compare Post Acute Care list provided to:: Patient    Discharge Placement                       Discharge Plan and Services In-house Referral: NA Discharge Planning Services: CM Consult Post Acute Care Choice: [place]          DME Arranged: N/A DME Agency: NA       HH Arranged: NA HH Agency: NA        Social Determinants of Health (SDOH) Interventions     Readmission Risk Interventions     No data to display

## 2022-04-12 MED ORDER — POLYETHYLENE GLYCOL 3350 17 G PO PACK: 17.0000 g | PACK | Freq: Every day | ORAL | 0 refills | Status: DC | PRN

## 2022-04-12 MED ORDER — TRIAMCINOLONE ACETONIDE 0.1 % EX CREA: TOPICAL_CREAM | Freq: Two times a day (BID) | CUTANEOUS | 0 refills | Status: DC

## 2022-04-12 MED ORDER — INFLUENZA VAC A&B SA ADJ QUAD 0.5 ML IM PRSY
0.5000 mL | PREFILLED_SYRINGE | INTRAMUSCULAR | Status: AC
  Administered 2022-04-12: 0.5 mL via INTRAMUSCULAR

## 2022-04-12 MED ORDER — VITAMIN B-1 100 MG PO TABS: 100.0000 mg | ORAL_TABLET | Freq: Every day | ORAL | Status: DC

## 2022-04-12 MED ORDER — CALAMINE EX LOTN: 1.0000 | TOPICAL_LOTION | Freq: Three times a day (TID) | CUTANEOUS | 0 refills | Status: DC

## 2022-04-12 MED ORDER — KETOCONAZOLE 2 % EX SHAM: MEDICATED_SHAMPOO | CUTANEOUS | 0 refills | Status: DC

## 2022-04-12 NOTE — Discharge Summary (Signed)
Physician Discharge Summary  Ashley Bradley TIR:[id number] DOB: 03-30-1951 DOA: 03/22/2022  PCP: Soundra Pilon, FNP  Admit date: 03/22/2022 Discharge date: 04/12/2022  Admitted From: Home Discharge disposition: SNF  Recommendations at discharge:  For dermatitis- ketoconazole shampoo every other day to hair/scalp, daily bathing, triamcinolone 0.025% (low potency) cream, calamine lotion, ketoconazole cream to affected body areas (chest, back, arms, legs, etc)   Stop alcohol Increase participation with physical therapy    Brief narrative: Ashley Bradley is a 71 y.o. female with PMH significant for cerebral palsy with seizure disorder, alcohol abuse with alcohol withdrawal seizure, depression, HTN, HLD, prolonged QTc, history of C. Difficile 9/19, patient presented to the ER with wanting to detox from alcohol. Her son passed away the end of 12-21-22 and she states she has been in severe depression and has been in bed since then with continuous drinking daily.  She stated that she drinks approximately 10-12 Coors light per day.  She orders beer via delivery.  Her husband is sober and does not drink in the house. She also had not showered since 12/21/2022.  She states that she has stayed in bed since then and wears diapers which her husband changes. She was compliant on her Prozac and Keppra.  She states it has been about 2 to 3 years since her last seizure which was associated with alcohol withdrawal at that time.   In the ED, she was tachycardic to 130s and was in alcohol withdrawal. On exam, she was noted to have diffuse rash all over her body, worse on her back. She had no lesions in her mouth.   The rash was discussed with [facility] dermatology however without adequate examination in person, they were unable to provide significant recommendations.  They did not recommend urgent transfer.  Patient also did not wish to transfer.     Patient was admitted to [facility] for alcohol withdrawal and ongoing  monitoring of rash as well as suspicion of severe depression. She endorsed no suicidal ideation.  Her hospital course was prolonged by severe generalized weakness.  It gradually improved with physical therapy.  She also had to wait for several days to get approval for SNF.   Subjective: Patient was seen and examined this morning. Lying on bed. Not in distress.  No new symptoms.    Hospital course: Chronic alcoholism  Alcohol withdrawal symptoms Initially admitted for alcohol withdrawal symptoms.  Patient apparently was drinking heavily since her son expired in 12-21-2022.  She presented to the ED wanting to be detoxed. Was monitored for withdrawal symptoms with CIWA protocol.  Treated with a Valium and Ativan.  She did not have significant withdrawal symptoms.  Mental status much improved.  Continue folate, multivitamin, thiamine  Widespread dermatitis On admission, she was noted to have diffuse rash all over her body, worse on her back. She had no lesions in her mouth.   It seems her generalized rashes are because of bedridden status, poor hygiene and poor nutrition.   Apparently, she had not showered or been out of bed since end of 2022/12/21 and wearing diapers for voiding and have [facility] ER did discuss with [facility] derm (Dr. Nolon Bussing) but no pics available to be reviewed; their initial rec was to consider triamcinolone or silvadene cream. Much of her rash looks like seborrheic and/or irritant contact dermatitis especially since it's worse on her back which if she's been laying in bed for 3 months would seem expected; scalp also had seborrheic  appearance and very neglected hair She completed 2 weeks course of oral fluconazole.  She was also started on ketoconazole shampoo every other day to hair/scalp, daily bathing, triamcinolone 0.025% (low potency) cream, calamine lotion, ketoconazole cream to affected body areas (chest, back, arms, legs, etc)    Fever Blood pressure 10/40 nursing note.    CT chest, abdomen pelvis obtained on 10/5 did not show any occult source of infection.  Initially started on broad-spectrum antibiotics and later switched to IV Rocephin and subsequently to oral Omnicef.  Completed the course.   Major depression Reports compliance to Prozac.   Denies suicidal ideation but patient grossly depressed since her son's death the end of December 16, 2022.  She has been laying in bed ordering delivery of beer and has not showered since 12/16/2022 either. Continue Prozac Psychiatry consult offered.  Patient refused and wanted to keep the follow-up plan with her counselor as before.   Essential hypertension Remains controlled without meds.  Previously on losartan which has been on hold.  Physical deconditioning bed bound since end of Dec 16, 2022 PT eval obtained.  SNF recommended.  History of seizure disorder Patient reports that her last withdrawal seizure was about 2 to 3 years ago. Continue Keppra   Spastic hemiplegic cerebral palsy  Right lower extremity noted with increased tone and decreased flexibility Continue Keppra  HLD Continue aspirin and statin  Goals of care   Code Status: Full Code   Mobility: PT eval obtained.  SNF recommended  Skin assessment: Generalized rashes improving.  Nutritional status:  Body mass index is 24.07 kg/m.          Diet:  Diet Order             Diet general           Diet regular Room service appropriate? No; Fluid consistency: Thin  Diet effective now                   DVT prophylaxis:  enoxaparin (LOVENOX) injection 40 mg Start: 03/22/22 2215   Antimicrobials: Oral Omnicef, oral fluconazole Fluid: Not IV fluid Consultants: None Family Communication: Husband at bedside today  Status is: Inpatient  Continue inhospital care because: Continues to spike fever.   Level of care: Med-Surg   Dispo: The patient is from: Home              Anticipated d/c is to: Pending clinical course              Patient currently is  not medically stable to d/c.   Difficult to place patient No     Infusions:      Scheduled Meds:  calamine  1 Application Topical TID   cefdinir  300 mg Oral Q12H   enoxaparin (LOVENOX) injection  40 mg Subcutaneous Q24H   FLUoxetine  40 mg Oral q morning   folic acid  1 mg Oral Daily   ketoconazole   Topical QODAY   levETIRAcetam  500 mg Oral BID   multivitamin with minerals  1 tablet Oral Daily   saccharomyces boulardii  500 mg Oral QPM   sodium chloride flush  3 mL Intravenous Q12H   thiamine  100 mg Oral Daily   triamcinolone cream   Topical BID    PRN meds: acetaminophen **OR** acetaminophen, hydrOXYzine, ondansetron (ZOFRAN) IV, mouth rinse, oxyCODONE, polyethylene glycol   Antimicrobials: Anti-infectives (From admission, onward)    Start     Dose/Rate Route Frequency Ordered Stop   04/10/22 2200  cefdinir (OMNICEF) capsule 300 mg        300 mg Oral Every 12 hours 04/10/22 1622     04/08/22 1000  cefTRIAXone (ROCEPHIN) 1 g in sodium chloride 0.9 % 100 mL IVPB  Status:  Discontinued        1 g 200 mL/hr over 30 Minutes Intravenous Every 24 hours 04/08/22 0813 04/10/22 1622   04/06/22 1000  vancomycin (VANCOREADY) IVPB 1250 mg/250 mL  Status:  Discontinued        1,250 mg 166.7 mL/hr over 90 Minutes Intravenous Daily 04/06/22 0900 04/06/22 0904   04/06/22 1000  vancomycin (VANCOREADY) IVPB 1250 mg/250 mL  Status:  Discontinued        1,250 mg 166.7 mL/hr over 90 Minutes Intravenous Daily 04/06/22 0904 04/08/22 1318   04/06/22 0930  ceFEPIme (MAXIPIME) 2 g in sodium chloride 0.9 % 100 mL IVPB  Status:  Discontinued        2 g 200 mL/hr over 30 Minutes Intravenous 2 times daily 04/06/22 0900 04/08/22 0813   03/26/22 1300  fluconazole (DIFLUCAN) tablet 100 mg  Status:  Discontinued        100 mg Oral Daily 03/26/22 1203 04/10/22 1624       Objective: Vitals:   04/11/22 2111 04/12/22 0653  BP: 111/77 122/80  Pulse: 92 84  Resp: 16 16  Temp: 98.2 F (36.8 C)  98.4 F (36.9 C)  SpO2: 98% 97%    Intake/Output Summary (Last 24 hours) at 04/12/2022 1141 Last data filed at 04/11/2022 1613 Gross per 24 hour  Intake --  Output 700 ml  Net -700 ml   Filed Weights   03/22/22 2118 04/05/22 1014  Weight: 65.6 kg 65.6 kg   Weight change:  Body mass index is 24.07 kg/m.   Physical Exam: General exam: Pleasant elderly Caucasian female.  Not in physical distress. Skin: Generalized very pruritic skin rash improving. HEENT: Atraumatic, normocephalic, no obvious bleeding Lungs: Clear to auscultation bilaterally CVS: Regular rate and rhythm, no murmur GI/Abd soft, nontender, nondistended, bowel sound present CNS: Alert, awake, oriented x3.  No alcohol related tremors Psychiatry: Mood appropriate Extremities: No pedal edema, no calf tenderness  Data Review: I have personally reviewed the laboratory data and studies available.  F/u labs ordered Unresulted Labs (From admission, onward)    None       Signed, Lorin Glass, MD [facility] 04/12/2022.

## 2022-04-12 NOTE — TOC Progression Note (Addendum)
Transition of Care [facility]) - Progression Note    Patient Details  Name: SOLYANA NONAKA MRN: [id number] Date of Birth: 1951-01-07  Transition of Care [facility]) CM/SW [place], RN Phone Number: 04/12/2022, 9:57 AM  Clinical Narrative:   Checked status of insurance auth, still pending  - 10:25a received insurance auth approval for SNF placement to [facility]. Notified MD, RN and patient. Awaiting discharge summary to coordinate transportation to SNF.   TOC will continue to follow.   Expected Discharge Plan: Skilled Nursing Facility Barriers to Discharge: Barriers Unresolved (comment) (Awaiting PASSR review for SNF placement)  Expected Discharge Plan and Services Expected Discharge Plan: [place] In-house Referral: NA Discharge Planning Services: CM Consult Post Acute Care Choice: [facility] Living arrangements for the past 2 months: [place]                 DME Arranged: N/A DME Agency: NA       HH Arranged: NA HH Agency: NA         Social Determinants of Health (SDOH) Interventions    Readmission Risk Interventions     No data to display

## 2022-04-12 NOTE — Progress Notes (Signed)
Called report to [employer], spoke with Berdine Addison, RN who will receive pt. VS, medication, pt's dx & PMH updated. Nurse verbalized understanding & has no further questions @ this time.

## 2022-04-12 NOTE — TOC Transition Note (Addendum)
Transition of Care [facility]) - CM/SW Discharge Note   Patient Details  Name: Ashley Bradley MRN: [id number] Date of Birth: 1951-03-18  Transition of Care [facility] CM/SW Contact:  Roseanne Kaufman, RN Phone Number: 04/12/2022, 12:24 PM   Clinical Narrative:  Damaris Schooner with Raquel Sarna with [facility] who provided room# 140, call report to phone# [phone] [facility] Nurse. Notified MD, RN.  Will call [facility] for transport.   Received call from [facility] with [facility], bed at [facility] needs to be repaired prior to patient arrival, awaiting a ETA to transport patient.  TOC will continue to follow  - 12:49pm Spoke with Raquel Sarna with [facility] who advised patient's room is ready. Calling [facility] for transportation.   Final next level of care: Skilled Nursing Facility Barriers to Discharge: No Barriers Identified   Patient Goals and CMS Choice Patient states their goals for this hospitalization and ongoing recovery are:: rehab at [facility] CMS Medicare.gov Compare Post Acute Care list provided to:: Patient Choice offered to / list presented to : Patient  Discharge Placement PASRR number recieved: 04/11/22            Patient chooses bed at: Other - please specify in the comment section below: [facility]) Patient to be transferred to facility by: [facility]      Discharge Plan and Services In-house Referral: NA Discharge Planning Services: CM Consult Post Acute Care Choice: [place]          DME Arranged: N/A DME Agency: NA       HH Arranged: NA HH Agency: NA        Social Determinants of Health (SDOH) Interventions     Readmission Risk Interventions     No data to display

## 2022-04-12 NOTE — Progress Notes (Signed)
Occupational Therapy Treatment Patient Details Name: Ashley Bradley MRN: [id number] DOB: May 29, 1951 Today's Date: 04/12/2022   History of present illness Pt is a 71yo female presenting to [facility] ED on 9/19 via EMS reporting ETOH binge x6weeks with N/V/D and body rash; reporting lying in bed with husband changing diapers. 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   OT comments  Patient was able to engage in grooming tasks sitting EOB with min guard on this date with no LOB or lateral leaning.Patient would continue to benefit from skilled OT services at this time while admitted and after d/c to address noted deficits in order to improve overall safety and independence in ADLs.      Recommendations for follow up therapy are one component of a multi-disciplinary discharge planning process, led by the attending physician.  Recommendations may be updated based on patient status, additional functional criteria and insurance authorization.    Follow Up Recommendations  Skilled nursing-short term rehab (<3 hours/day)    Assistance Recommended at Discharge Frequent or constant Supervision/Assistance  Patient can return home with the following  Two people to help with bathing/dressing/bathroom;Two people to help with walking and/or transfers;Assistance with cooking/housework;Direct supervision/assist for medications management;Assist for transportation;Help with stairs or ramp for entrance;Direct supervision/assist for financial management;Assistance with feeding   Equipment Recommendations  Other (comment) (defer to next level of care)    Recommendations for Other Services      Precautions / Restrictions Precautions Precautions: Fall Precaution Comments: R hemiparesis secondary to CP, rash Restrictions Weight Bearing Restrictions: No       Mobility Bed Mobility Overal bed mobility: Needs Assistance Bed Mobility: Rolling, Sidelying to Sit, Sit to Supine Rolling: Min  guard Sidelying to sit: Min guard Supine to sit: Min guard          Transfers                         Balance Overall balance assessment: Needs assistance Sitting-balance support: No upper extremity supported, Feet supported Sitting balance-Leahy Scale: Fair Sitting balance - Comments: no lateral leaning.                                   ADL either performed or assessed with clinical judgement   ADL Overall ADL's : Needs assistance/impaired     Grooming: Min guard;Wash/dry face;Sitting;Oral care Grooming Details (indicate cue type and reason): supported sitting EOB                               General ADL Comments: patient attempted to scoot transfer with patient able to scoot forwards and backwards on edge of bed with no LOB but unable to scoot laterally EOB with multiple attempts.    Extremity/Trunk Assessment              Vision       Perception     Praxis      Cognition Arousal/Alertness: Awake/alert Behavior During Therapy: WFL for tasks assessed/performed Overall Cognitive Status: Within Functional Limits for tasks assessed                                 General Comments: Able to answer questions and follow commands. Pt pleasant  Exercises      Shoulder Instructions       General Comments      Pertinent Vitals/ Pain       Pain Assessment Pain Assessment: 0-10 Pain Score: 4  Pain Location: back and b/l hips Pain Descriptors / Indicators: Aching, Discomfort, Guarding Pain Intervention(s): Monitored during session, Repositioned  Home Living                                          Prior Functioning/Environment              Frequency  Min 2X/week        Progress Toward Goals  OT Goals(current goals can now be found in the care plan section)  Progress towards OT goals: Progressing toward goals     Plan Discharge plan remains appropriate     Co-evaluation                 AM-PAC OT "6 Clicks" Daily Activity     Outcome Measure   Help from another person eating meals?: A Little Help from another person taking care of personal grooming?: A Little Help from another person toileting, which includes using toliet, bedpan, or urinal?: A Lot Help from another person bathing (including washing, rinsing, drying)?: A Lot Help from another person to put on and taking off regular upper body clothing?: A Little Help from another person to put on and taking off regular lower body clothing?: A Lot 6 Click Score: 15    End of Session    OT Visit Diagnosis: Muscle weakness (generalized) (M62.81)   Activity Tolerance Patient tolerated treatment well   Patient Left in bed;with call bell/phone within reach;with bed alarm set   Nurse Communication Mobility status        Time: [id number] OT Time Calculation (min): 16 min  Charges: OT General Charges $OT Visit: 1 Visit OT Treatments $Self Care/Home Management : 8-22 mins  Rennie Plowman, MS Acute Rehabilitation Department Office# [phone]   Marcellina Millin 04/12/2022, 3:41 PM

## 2023-07-14 ENCOUNTER — Ambulatory Visit: Payer: Medicare Other | Admitting: Obstetrics

## 2023-07-29 DIAGNOSIS — N3000 Acute cystitis without hematuria: Secondary | ICD-10-CM | POA: Diagnosis not present

## 2023-08-18 DIAGNOSIS — Z87898 Personal history of other specified conditions: Secondary | ICD-10-CM | POA: Diagnosis not present

## 2023-08-18 DIAGNOSIS — G802 Spastic hemiplegic cerebral palsy: Secondary | ICD-10-CM | POA: Diagnosis not present

## 2023-09-11 ENCOUNTER — Ambulatory Visit (INDEPENDENT_AMBULATORY_CARE_PROVIDER_SITE_OTHER): Payer: Medicare Other | Admitting: Obstetrics

## 2023-09-11 ENCOUNTER — Encounter: Payer: Self-pay | Admitting: Obstetrics

## 2023-09-11 VITALS — BP 118/76 | HR 79 | Ht 63.0 in | Wt 154.9 lb

## 2023-09-11 DIAGNOSIS — R159 Full incontinence of feces: Secondary | ICD-10-CM | POA: Diagnosis not present

## 2023-09-11 DIAGNOSIS — R102 Pelvic and perineal pain: Secondary | ICD-10-CM

## 2023-09-11 DIAGNOSIS — G802 Spastic hemiplegic cerebral palsy: Secondary | ICD-10-CM

## 2023-09-11 DIAGNOSIS — Z9889 Other specified postprocedural states: Secondary | ICD-10-CM

## 2023-09-11 DIAGNOSIS — N8110 Cystocele, unspecified: Secondary | ICD-10-CM

## 2023-09-11 DIAGNOSIS — N390 Urinary tract infection, site not specified: Secondary | ICD-10-CM | POA: Diagnosis not present

## 2023-09-11 DIAGNOSIS — N3946 Mixed incontinence: Secondary | ICD-10-CM | POA: Insufficient documentation

## 2023-09-11 DIAGNOSIS — R351 Nocturia: Secondary | ICD-10-CM | POA: Diagnosis not present

## 2023-09-11 LAB — POCT URINALYSIS DIPSTICK
Bilirubin, UA: NEGATIVE
Blood, UA: NEGATIVE
Glucose, UA: NEGATIVE
Ketones, UA: NEGATIVE
Leukocytes, UA: NEGATIVE
Nitrite, UA: NEGATIVE
Protein, UA: NEGATIVE
Spec Grav, UA: <=1.005 — AB (ref 1.010–1.025)
Urobilinogen, UA: 0.2 U/dL
pH, UA: 5.5 (ref 5.0–8.0)

## 2023-09-11 MED ORDER — ESTRADIOL 0.1 MG/GM VA CREA: 0.5000 g | TOPICAL_CREAM | VAGINAL | 3 refills | Status: DC

## 2023-09-11 MED ORDER — GEMTESA 75 MG PO TABS: 75.0000 mg | ORAL_TABLET | Freq: Every day | ORAL | Status: DC

## 2023-09-11 MED ORDER — LIDOCAINE 5 % EX OINT: TOPICAL_OINTMENT | CUTANEOUS | 0 refills | Status: AC

## 2023-09-11 MED ORDER — URIBEL 118 MG PO CAPS: 1.0000 | ORAL_CAPSULE | Freq: Four times a day (QID) | ORAL | 1 refills | Status: DC | PRN

## 2023-09-11 MED ORDER — GEMTESA 75 MG PO TABS: 75.0000 mg | ORAL_TABLET | Freq: Every day | ORAL | 2 refills | Status: DC

## 2023-09-11 NOTE — Assessment & Plan Note (Addendum)
-   likely due to increased fluid intake prior to bedtime to reduce bladder discomfort For night time frequency: - avoid fluid intake 3 hours before bedtime - elevated feet during the day or use compression socks to reduce lower extremity swelling - denies snoring - trial of overactive bladder medication

## 2023-09-11 NOTE — Assessment & Plan Note (Signed)
-   POCT UA negative, PVR 10mL - urgency > stress - We discussed the symptoms of overactive bladder (OAB), which include urinary urgency, urinary frequency, nocturia, with or without urge incontinence.  While we do not know the exact etiology of OAB, several treatment options exist. We discussed management including behavioral therapy (decreasing bladder irritants, urge suppression strategies, timed voids, bladder retraining), physical therapy, medication; for refractory cases posterior tibial nerve stimulation, sacral neuromodulation, and intravesical botulinum toxin injection.  For anticholinergic medications, we discussed the potential side effects of anticholinergics including dry eyes, dry mouth, constipation, cognitive impairment and urinary retention. For Beta-3 agonist medication, we discussed the potential side effect of elevated blood pressure which is more likely to occur in individuals with uncontrolled hypertension. - recommended fluid management and behavioral modification - samples and Rx provided for OAB - For treatment of stress urinary incontinence,  non-surgical options include expectant management, weight loss, physical therapy, as well as a pessary.  Surgical options include a midurethral sling, Burch urethropexy, and transurethral injection of a bulking agent. - referral to pelvic floor PT

## 2023-09-11 NOTE — Assessment & Plan Note (Addendum)
-   discussed risk of neurogenic bladder due to CP and need for urodynamics  - pt declines schedule for urodynamics at this time and will re-discuss at follow-up  - PVR 10mL - Cr WNL 0.73 on 03/21/23

## 2023-09-11 NOTE — Patient Instructions (Addendum)
 We discussed the symptoms of overactive bladder (OAB), which include urinary urgency, urinary frequency, night-time urination, with or without urge incontinence.  We discussed management including behavioral therapy (decreasing bladder irritants by following a bladder diet, urge suppression strategies, timed voids, bladder retraining), physical therapy, medication; and for refractory cases posterior tibial nerve stimulation, sacral neuromodulation, and intravesical botulinum toxin injection.   For Beta-3 agonist medication, we discussed the potential side effect of elevated blood pressure which is more likely to occur in individuals with uncontrolled hypertension. You were given samples for Gemtesa 75mg .  It can take a month to start working so give it time, but if you have bothersome side effects call sooner and we can try a different medication.  Call us if you have trouble filling the prescription or if it's not covered by your insurance.  For treatment of stress urinary incontinence, which is leakage with physical activity/movement/strainging/coughing, we discussed expectant management versus nonsurgical options versus surgery. Nonsurgical options include weight loss, physical therapy, as well as a pessary.  Surgical options include a midurethral sling, which is a synthetic mesh sling that acts like a hammock under the urethra to prevent leakage of urine, a Burch urethropexy, and transurethral injection of a bulking agent.   For treatment of recurrent urinary tract infections, we discussed management of recurrent UTIs including prophylaxis with a daily low dose antibiotic, transvaginal estrogen therapy, D-mannose, and cranberry supplements.  We discussed the role of diagnostic testing such as cystoscopy and upper tract imaging.     For vaginal atrophy (thinning of the vaginal tissue that can cause dryness and burning) and UTI prevention we discussed estrogen replacement in the form of vaginal cream.    Start vaginal estrogen therapy nightly for two weeks then 2 times weekly at night. This can be placed with your finger or an applicator inside the vagina and around the urethra.  Please let us know if the prescription is too expensive and we can look for alternative options.   Is vaginal estrogen therapy safe for me? Vaginal estrogen preparations act on the vaginal skin, and only a very tiny amount is absorbed into the bloodstream (0.01%).  They work in a similar way to hand or face cream.  There is minimal absorption and they are therefore perfectly safe. If you have had breast cancer and have persistent troublesome symptoms which aren't settling with vaginal moisturisers and lubricants, local estrogen treatment may be a possibility, but consultation with your oncologist should take place first.   You have a stage 2 (out of 4) prolapse.  We discussed the fact that it is not life threatening but there are several treatment options. For treatment of pelvic organ prolapse, we discussed options for management including expectant management, conservative management, and surgical management, such as Kegels, a pessary, pelvic floor physical therapy, and specific surgical procedures.     Accidental Bowel Leakage:  - Treatment options include anti-diarrhea medication (loperamide/ Imodium OTC or prescription lomotil), fiber supplements, physical therapy, and possible sacral neuromodulation or surgery.  Trial of IBGuard.    For irritative bladder we reviewed treatment options including altering her diet to avoid irritative beverages and foods as well as attempting to decrease stress and other exacerbating factors.  We also discussed using pyridium and similar over-the-counter medications for pain relief as needed. We discussed the pentad of medications including Elmiron, Tums, an antihistamine such as Vistaril, amitriptyline, and L-arginine.  We also discussed in-office bladder instillations for pain flares, as  well as cystoscopy with  hydrodistention in the operating room, which can be both diagnostic and therapeutic. She was also given information on the IC Network at https://www.ic-network.com for bladder diet suggestions and patient forums for support.   Use uribel as needed for bladder symptoms.  The origin of pelvic floor muscle spasm can be multifactorial, including primary, reactive to a different pain source, trauma, or even part of a centralized pain syndrome.Treatment options include pelvic floor physical therapy, local (vaginal) or oral  muscle relaxants, pelvic muscle trigger point injections or centrally acting pain medications.     We have sent a referral for pelvic floor PT, please schedule your appointment today.  Sleep with a pillow between your legs and consider use of a pelvic binder.

## 2023-09-11 NOTE — Assessment & Plan Note (Addendum)
-   no positive cultures available for review, pt reports intermittent UTI symptoms with negative culture - history of C. Diff, discussed need to avoid antibiotic exposure in the absence of positive culture - For treatment of recurrent urinary tract infections, we discussed management of recurrent UTIs including prophylaxis with a daily low dose antibiotic, transvaginal estrogen therapy, D-mannose, and cranberry supplements.  We discussed the role of diagnostic testing such as cystoscopy and upper tract imaging.   - Rx vaginal estrogen and continue probiotics - history of AKI - pt to return for testing if recurrent symptoms - discussed risk of contamination due to fecal leakage, encouraged catheterized sample - discussed overlapping symptomatology with bladder pain syndrome  - For irritative bladder we reviewed treatment options including altering her diet to avoid irritative beverages and foods as well as attempting to decrease stress and other exacerbating factors.  We also discussed using pyridium and similar over-the-counter medications for pain relief as needed. We discussed the pentad of medications including Elmiron, Tums, an antihistamine such as Vistaril, amitriptyline, and L-arginine.  We also discussed in-office bladder instillations for pain flares, as well as cystoscopy with hydrodistention in the operating room, which can be both diagnostic and therapeutic. She was also given information on the IC Network at https://www.ic-network.com for bladder diet suggestions and patient forums for support. - Rx uribel due to prior use with relief PRN symptoms. - referral to pelvic floor PT for myofascial pelvic pain

## 2023-09-11 NOTE — Assessment & Plan Note (Signed)
-   History of MMK, anterior and posterior repair by Dr. Vonita Moss (urology) and Dr. Candis Musa (GYN) in 1994 for UTIs with gross hematuria and pelvic organ prolapse within vagina - For treatment of pelvic organ prolapse, we discussed options for management including expectant management, conservative management, and surgical management, such as Kegels, a pessary, pelvic floor physical therapy, and specific surgical procedures. - bladder scan WNL - referral for pelvic floor PT

## 2023-09-11 NOTE — Assessment & Plan Note (Addendum)
-   low back pain s/p lumbar decompression with prior relief after PT, uses back brace with CP - myofascial back, abdominal and pelvic pain with pelvic girdle pain - encouraged pillow use when sleeping on her side, pelvic binder use - The origin of pelvic floor muscle spasm can be multifactorial, including primary, reactive to a different pain source, trauma, or even part of a centralized pain syndrome.Treatment options include pelvic floor physical therapy, local (vaginal) or oral  muscle relaxants, pelvic muscle trigger point injections or centrally acting pain medications.   - referral to pelvic floor PT - Rx topical lidocaine PRN pain up to 3x/day - discussed possible trigger point injection at apex of Pfannenstiel incision if refractory symptoms

## 2023-09-11 NOTE — Progress Notes (Signed)
 New Patient Evaluation and Consultation  Referring Provider: Soundra Pilon, FNP PCP: Soundra Pilon, FNP Date of Service: 09/11/2023  SUBJECTIVE Chief Complaint: New Patient (Initial Visit) (Ashley Bradley is a 73 y.o. female here today for pelvic floor floor dysfunction.)  History of Present Illness: Ashley Bradley is a 73 y.o. White or Caucasian female seen in consultation at the request of NP Brake for evaluation of pelvic floor dysfunction.    Most bothered by midline pelvic pressure described as "over her incision" when her bladder is empty, sleeps with a heating pad with low back pain for 3-4 years and return of UTIs with dysuria, urinary urgency. Sometimes with negative urine cultures. Denies history of pyelonephritis or kidney stones. Azo with relief, avoids NSAIDs due to h/o AKI.  Pain 8/10, underwent PT with relief in 2023 and 2024.  History of MMK, anterior and posterior repair by Dr. Vonita Moss (urology) and Dr. Candis Musa (GYN) in 1994 for UTIs with gross hematuria and pelvic organ prolapse within vagina. Denies urinary leakage.  Symptoms resolved after surgery, denies need for catheterization.  Prior EtOH resulted in MRSA sepsis treated by PICC line with ID, AKI and hospitalization, most recently in 03/2022. Alcohol free for almost 18 months, last when her son passed away at 38yo. Urgency urinary leakage and vaginal bulge returned 6-8 years ago, mostly when she heads to the door with urgency. Evaluated at [facility], provided sample of medication for bladder spasm with relief. Prior use of Uribel, tylenol, Azo, and increased fluid intake with relief Reports IBS with avoiding tomatoes, pickles, nuts, marinara.  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   Patient is bothered by UI symptoms.  Day time voids 6-8.  Nocturia: 1-2 times per night to void started 3-4 years ago Drinks diet cranberry juice and water at night since 3 years ago due to bladder symptoms and dry mouth Voiding dysfunction:  does not empty bladder well.  Patient does not use a catheter to empty bladder.  When urinating, patient feels a weak stream, dribbling after finishing, and the need to urinate multiple times in a row Drinks: 64oz per day, 2 cups of coffee, 1 decaf green tea, 1 dandelion root tea  UTIs:  5-6  UTI's in the last year.   Denies history of kidney or bladder stones, pyelonephritis, bladder cancer, and kidney cancer Blood in urine during UTI episodes only No results found for the last 90 days.   Pelvic Organ Prolapse Symptoms:                  Patient Admits to a feeling of a "finger-like" bulge in the vaginal area. It has been present for 10 years.  Patient Denies seeing a bulge.  This bulge is bothersome.  Bowel Symptom: Bowel movements: 1-2 time(s) per day with IBS Stool consistency: soft  Straining: no.  Splinting: no.  Incomplete evacuation: yes.  Patient Admits to accidental bowel leakage / fecal incontinence around 3-4 years ago  triggered by diet and diarrhea  Occurs: 4 time(s) per week with cramping and diarrhea  Consistency with leakage: soft , denies blood in stool Bowel regimen: diet, fiber, and florastor Last colonoscopy: Date around 2010, Results not available for review [facility]          Current Care Gaps     Colonoscopy (Every 10 Years) Never done   No completion history exists for this topic.                 Sexual  Function Sexually active: no.  Sexual orientation: Straight Pain with sex: No  Pelvic Pain Admits to pelvic pain Location: low pelvis and back Pain occurs: daily Prior pain treatment: surgery in 1994 Improved by: heat and anti-inflammatory meds Worsened by: full bladder and standing   Past Medical History:  Past Medical History:  Diagnosis Date   Alcoholism (HCC)    Complication of anesthesia    hard time waking up   CP (cerebral palsy) (HCC)    Depression    Hypertension    PONV (postoperative nausea and vomiting)    Seizures (HCC)    ETOH induced     Past Surgical History:   Past Surgical History:  Procedure Laterality Date   ANTERIOR AND POSTERIOR REPAIR     BACK SURGERY     x 3   BLADDER SURGERY     sling   BREAST ENHANCEMENT SURGERY     ESOPHAGOGASTRODUODENOSCOPY (EGD) WITH PROPOFOL N/A 10/21/2017   Procedure: ESOPHAGOGASTRODUODENOSCOPY (EGD) WITH PROPOFOL;  Surgeon: Vida Rigger, MD;  Location: [facility];  Service: Endoscopy;  Laterality: N/A;   TEE WITHOUT CARDIOVERSION N/A 10/24/2017   Procedure: TRANSESOPHAGEAL ECHOCARDIOGRAM (TEE);  Surgeon: Laqueta Linden, MD;  Location: [facility] ENDOSCOPY;  Service: Cardiovascular;  Laterality: N/A;   TUBAL LIGATION       Past OB/GYN History: OB History  Gravida Para Term Preterm AB Living  2 2 2   2   SAB IAB Ectopic Multiple Live Births      2    # Outcome Date GA Lbr Len/2nd Weight Sex Type Anes PTL Lv  2 Term     F Vag-Spont   LIV  1 Term     M Vag-Spont   LIV   Vaginal deliveries: 2, largest infant 8lb5oz Forceps/ Vacuum deliveries: 1 Froceps with 4th degree laceration, denies FI postpartum, Cesarean section: 0 Menopausal: Yes, at age 71, Denies vaginal bleeding since menopause Contraception: s/p menopause, BTL. Last pap smear was 2013.  Any history of abnormal pap smears: no. No results found for: "DIAGPAP", "HPVHIGH", "ADEQPAP"  Medications: Patient has a current medication list which includes the  following prescription(s): atorvastatin, biotin, vitamin d3, [START ON 09/14/2023] estradiol, levetiracetam, lidocaine, losartan, uribel, multivitamin with minerals, saccharomyces boulardii, gemtesa, and gemtesa.   Allergies: Patient is allergic to ancef [cefazolin], pantopaque [iophendylate], macrodantin [nitrofurantoin], and morphine and codeine.   Social History:  Social History   Tobacco Use   Smoking status: Never   Smokeless tobacco: Never  Vaping Use   Vaping status: Never Used  Substance Use Topics   Alcohol use: Not Currently    Alcohol/week: 70.0 - 84.0 standard drinks of alcohol    Types: 70 - 84 Cans of beer per week    Comment: 10-12 beers per day / remission 17 month - 09-11-2023   Drug use: Never    Relationship status: married Patient lives with her spouse.   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 Regular exercise: No History of abuse: No  Family History:   Family History  Problem Relation Age of Onset   CAD Mother 22   Heart attack Mother    Lung cancer Father 18   Cervical cancer Sister    Bone cancer Maternal Grandmother    Uterine cancer Neg Hx    Bladder Cancer Neg Hx      Review of Systems: Review of Systems  Constitutional:  Positive for malaise/fatigue. Negative for fever and weight loss.       Weight gain  Respiratory:  Positive for cough. Negative for shortness of breath and wheezing.   Cardiovascular:  Negative for chest pain, palpitations and leg swelling.  Gastrointestinal:  Positive for abdominal pain. Negative for blood in stool.  Genitourinary:  Positive for dysuria, frequency and urgency. Negative for hematuria.  Skin:  Negative for rash.  Neurological:  Positive for dizziness and headaches. Negative for weakness.  Endo/Heme/Allergies:  Bruises/bleeds easily.  Psychiatric/Behavioral:  Positive for depression. The patient is not nervous/anxious.      OBJECTIVE Physical Exam: Vitals:   09/11/23 0812  BP: 118/76   Pulse: 79  Weight: 154 lb 14.4 oz (70.3 kg)  Height: 5\' 3"  (1.6 m)   Physical Exam Constitutional:      General: She is not in acute distress.    Appearance: Normal appearance.  Genitourinary:     Bladder and urethral meatus normal.     No lesions in the vagina.     Genitourinary Comments: Midline anterior vaginal wall scarring approximately 1-2cm proximal from introitus     Right Labia: No rash, tenderness, lesions, skin changes or Bartholin's cyst.    Left Labia: No tenderness, lesions, skin changes, Bartholin's cyst or rash.    Vaginal tenderness (along scarring along anterior vaginal wall) present.     No vaginal discharge, erythema, bleeding, ulceration, mesh exposure or granulation tissue.     Anterior and posterior vaginal prolapse present.    Severe vaginal atrophy present.     Right Adnexa: not tender, not full and no mass present.    Left Adnexa: not tender, not full and no mass present.    No cervical motion tenderness, discharge, friability, lesion, polyp or nabothian cyst.        Uterus is not enlarged, fixed, tender or irregular.     No uterine mass detected.    Urethral meatus caruncle not present.    No urethral prolapse, tenderness, mass, hypermobility, discharge or stress urinary incontinence with cough stress test present.     Bladder is tender.     Bladder urgency on palpation not present and masses not present.      Levator ani is tender and obturator internus is tender.     No asymmetrical contractions present and no pelvic spasms present.    Anal wink absent and BC reflex absent.     Symmetrical pelvic sensation. Rectum:     No rectal mass, tenderness or rectovaginal septum nodularity.  Cardiovascular:     Rate and Rhythm: Normal rate.  Pulmonary:     Effort: Pulmonary effort is normal. No respiratory distress.  Abdominal:     General: There is no distension.     Palpations: Abdomen is soft. There is no mass.     Tenderness: There is abdominal  tenderness.     Hernia: No hernia is present.    Musculoskeletal:       Legs:  Neurological:     Mental Status: She is  alert.  Vitals reviewed. Exam conducted with a chaperone present.      POP-Q:   POP-Q  0                                            Aa   0                                           Ba  -6                                              C   2                                            Gh  3                                            Pb  8                                            tvl   -2                                            Ap  -2                                            Bp  -6                                              D      Rectal Exam:  Normal sphincter tone, small distal rectocele, enterocoele not present, no rectal masses, no sign of dyssynergia when asking the patient to bear down. Thinning of external anal sphincter on exam  Post-Void Residual (PVR) by Bladder Scan: In order to evaluate bladder emptying, we discussed obtaining a postvoid residual and patient agreed to this procedure.  Procedure: The ultrasound unit was placed on the patient's abdomen in the suprapubic region after the patient had voided.    Post Void Residual - 09/11/23 0826       Post Void Residual   Post Void Residual 10 mL              Laboratory Results: Lab Results  Component Value Date   COLORU Yellow 09/11/2023   CLARITYU Clear 09/11/2023   GLUCOSEUR Negative 09/11/2023   BILIRUBINUR Negative 09/11/2023   KETONESU Negativ 09/11/2023   SPECGRAV <=1.005 (A) 09/11/2023  RBCUR Negative 09/11/2023   PHUR 5.5 09/11/2023   PROTEINUR Negative 09/11/2023   UROBILINOGEN 0.2 09/11/2023   LEUKOCYTESUR Negative 09/11/2023    Lab Results  Component Value Date   CREATININE 0.47 04/09/2022   CREATININE 0.56 04/08/2022   CREATININE 0.42 (L) 04/07/2022    Lab Results  Component Value Date   HGBA1C 4.9 01/24/2020    Lab Results   Component Value Date   HGB 13.0 04/09/2022     ASSESSMENT AND PLAN Ms. Halperin is a 73 y.o. with:  1. Nocturia   2. History of pelvic surgery   3. Urinary incontinence, mixed   4. Incontinence of feces, unspecified fecal incontinence type   5. Recurrent UTI   6. Pelvic pain   7. 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HCC)     Nocturia Assessment & Plan: - likely due to increased fluid intake prior to bedtime to reduce bladder discomfort For night time frequency: - avoid fluid intake 3 hours before bedtime - elevated feet during the day or use compression socks to reduce lower extremity swelling - denies snoring - trial of overactive bladder medication   Orders: -     POCT urinalysis dipstick -     AMB referral to rehabilitation  History of pelvic surgery Assessment & Plan: - s/p MMK, anterior and posterior repair by Dr. Vonita Moss (urology) and Dr. Candis Musa (GYN) in 1994 for UTIs with gross hematuria and pelvic organ prolapse within vagina - discussed need for urodynamic testing prior to 3rd line therapy and history of spastic cerebral palsy - pt desires to postpone at this time  Orders: -     AMB referral to rehabilitation  Urinary incontinence, mixed Assessment & Plan: - POCT UA negative, PVR 10mL - urgency > stress - We discussed the symptoms of overactive bladder (OAB), which include urinary urgency, urinary frequency, nocturia, with or without urge incontinence.  While we do not know the exact etiology of OAB, several treatment options exist. We discussed management including behavioral therapy (decreasing bladder irritants, urge suppression strategies, timed voids, bladder retraining), physical therapy, medication; for refractory cases posterior tibial nerve stimulation, sacral neuromodulation, and intravesical botulinum toxin injection.  For anticholinergic medications, we discussed the potential side effects of  anticholinergics including dry eyes, dry mouth, constipation, cognitive impairment and urinary retention. For Beta-3 agonist medication, we discussed the potential side effect of elevated blood pressure which is more likely to occur in individuals with uncontrolled hypertension. - recommended fluid management and behavioral modification - samples and Rx provided for OAB - For treatment of stress urinary incontinence,  non-surgical options include expectant management, weight loss, physical therapy, as well as a pessary.  Surgical options include a midurethral sling, Burch urethropexy, and transurethral injection of a bulking agent. - referral to pelvic floor PT  Orders: -     Gemtesa; Take 1 tablet (75 mg total) by mouth daily. Ashley Bradley; Take 1 tablet (75 mg total) by mouth daily.  Dispense: 30 tablet; Refill: 2 -     AMB referral to rehabilitation  Incontinence of feces, unspecified fecal incontinence type Assessment & Plan: - reports OASIS with 4th degree laceration, thinning of external anal sphincter on exam - Treatment options include anti-diarrhea medication (loperamide/ Imodium OTC or prescription lomotil), fiber supplements, physical therapy, and possible sacral neuromodulation or surgery.   - encouraged fiber supplementation with referral to pelvic floor PT sent - trial of Ibguard for  IBS-D  Orders: -     AMB referral to rehabilitation  Recurrent UTI Assessment & Plan: - no positive cultures available for review, pt reports intermittent UTI symptoms with negative culture - history of C. Diff, discussed need to avoid antibiotic exposure in the absence of positive culture - For treatment of recurrent urinary tract infections, we discussed management of recurrent UTIs including prophylaxis with a daily low dose antibiotic, transvaginal estrogen therapy, D-mannose, and cranberry supplements.  We discussed the role of diagnostic testing such as cystoscopy and upper tract imaging.    - Rx vaginal estrogen and continue probiotics - history of AKI - pt to return for testing if recurrent symptoms - discussed risk of contamination due to fecal leakage, encouraged catheterized sample - discussed overlapping symptomatology with bladder pain syndrome  - For irritative bladder we reviewed treatment options including altering her diet to avoid irritative beverages and foods as well as attempting to decrease stress and other exacerbating factors.  We also discussed using pyridium and similar over-the-counter medications for pain relief as needed. We discussed the pentad of medications including Elmiron, Tums, an antihistamine such as Vistaril, amitriptyline, and L-arginine.  We also discussed in-office bladder instillations for pain flares, as well as cystoscopy with hydrodistention in the operating room, which can be both diagnostic and therapeutic. She was also given information on the IC Network at https://www.ic-network.com for bladder diet suggestions and patient forums for support. - Rx uribel due to prior use with relief PRN symptoms. - referral to pelvic floor PT for myofascial pelvic pain  Orders: -     Estradiol; Place 0.5 g vaginally 2 (two) times a week. Place 0.5g nightly for two weeks then twice a week after  Dispense: 30 g; Refill: 3 -     Uribel; Take 1 capsule (118 mg total) by mouth 4 (four) times daily as needed (bladder pain).  Dispense: 30 capsule; Refill: 1  Pelvic pain Assessment & Plan: - low back pain s/p lumbar decompression with prior relief after PT, uses back brace with CP - myofascial back, abdominal and pelvic pain with pelvic girdle pain - encouraged pillow use when sleeping on her side, pelvic binder use - The origin of pelvic floor muscle spasm can be multifactorial, including primary, reactive to a different pain source, trauma, or even part of a centralized pain syndrome.Treatment options include pelvic floor physical therapy, local (vaginal) or oral   muscle relaxants, pelvic muscle trigger point injections or centrally acting pain medications.   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 Vonita Moss (urology) and Dr. Candis Musa (GYN) in 1994 for UTIs with gross hematuria and pelvic organ prolapse within vagina - For treatment of pelvic organ prolapse, we discussed options for management including expectant management, conservative management, and surgical management, such as Kegels, a pessary, pelvic floor physical therapy, and specific surgical procedures. - bladder scan WNL - referral for pelvic floor PT   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HCC) Assessment & Plan: - discussed risk of neurogenic bladder due to CP and need for urodynamics  -  pt declines schedule for urodynamics at this time and will re-discuss at follow-up  - PVR 10mL - Cr WNL 0.73 on 03/21/23   Time spent: I spent 80 minutes dedicated to the care of this patient on the date of this encounter to include pre-visit review of records, face-to-face time with the patient discussing mixed urinary incontinence, pelvic pain, recurrent UTI, cerebral palsy, stage II pelvic organ prolapse, nocturia, fecal incontinence, and post visit documentation and ordering medication/ testing.    Loleta Chance, MD

## 2023-09-11 NOTE — Assessment & Plan Note (Addendum)
-   s/p MMK, anterior and posterior repair by Dr. Vonita Moss (urology) and Dr. Candis Musa (GYN) in 1994 for UTIs with gross hematuria and pelvic organ prolapse within vagina - discussed need for urodynamic testing prior to 3rd line therapy and history of spastic cerebral palsy - pt desires to postpone at this time

## 2023-09-11 NOTE — Assessment & Plan Note (Addendum)
-   reports OASIS with 4th degree laceration, thinning of external anal sphincter on exam - Treatment options include anti-diarrhea medication (loperamide/ Imodium OTC or prescription lomotil), fiber supplements, physical therapy, and possible sacral neuromodulation or surgery.   - encouraged fiber supplementation with referral to pelvic floor PT sent - trial of Ibguard for IBS-D

## 2023-10-02 ENCOUNTER — Ambulatory Visit: Payer: Medicare Other | Admitting: Obstetrics

## 2023-10-26 DIAGNOSIS — E7800 Pure hypercholesterolemia, unspecified: Secondary | ICD-10-CM | POA: Diagnosis not present

## 2023-10-26 DIAGNOSIS — Z23 Encounter for immunization: Secondary | ICD-10-CM | POA: Diagnosis not present

## 2023-10-26 DIAGNOSIS — I4581 Long QT syndrome: Secondary | ICD-10-CM | POA: Diagnosis not present

## 2023-10-26 DIAGNOSIS — Z0000 Encounter for general adult medical examination without abnormal findings: Secondary | ICD-10-CM | POA: Diagnosis not present

## 2023-10-26 DIAGNOSIS — I10 Essential (primary) hypertension: Secondary | ICD-10-CM | POA: Diagnosis not present

## 2023-10-26 DIAGNOSIS — G802 Spastic hemiplegic cerebral palsy: Secondary | ICD-10-CM | POA: Diagnosis not present

## 2023-11-01 DIAGNOSIS — R252 Cramp and spasm: Secondary | ICD-10-CM | POA: Diagnosis not present

## 2023-12-12 ENCOUNTER — Ambulatory Visit: Admitting: Obstetrics

## 2024-01-02 DIAGNOSIS — G802 Spastic hemiplegic cerebral palsy: Secondary | ICD-10-CM | POA: Diagnosis not present

## 2024-01-02 DIAGNOSIS — M5441 Lumbago with sciatica, right side: Secondary | ICD-10-CM | POA: Diagnosis not present

## 2024-01-04 ENCOUNTER — Ambulatory Visit: Admitting: Obstetrics

## 2024-01-22 DIAGNOSIS — M51361 Other intervertebral disc degeneration, lumbar region with lower extremity pain only: Secondary | ICD-10-CM | POA: Diagnosis not present

## 2024-01-22 DIAGNOSIS — M961 Postlaminectomy syndrome, not elsewhere classified: Secondary | ICD-10-CM | POA: Diagnosis not present

## 2024-01-22 DIAGNOSIS — M4726 Other spondylosis with radiculopathy, lumbar region: Secondary | ICD-10-CM | POA: Diagnosis not present

## 2024-01-22 DIAGNOSIS — M48061 Spinal stenosis, lumbar region without neurogenic claudication: Secondary | ICD-10-CM | POA: Diagnosis not present

## 2024-03-14 DIAGNOSIS — R300 Dysuria: Secondary | ICD-10-CM | POA: Diagnosis not present

## 2024-03-14 DIAGNOSIS — R52 Pain, unspecified: Secondary | ICD-10-CM | POA: Diagnosis not present

## 2024-03-14 DIAGNOSIS — R350 Frequency of micturition: Secondary | ICD-10-CM | POA: Diagnosis not present

## 2024-03-20 DIAGNOSIS — I10 Essential (primary) hypertension: Secondary | ICD-10-CM | POA: Diagnosis not present

## 2024-03-20 DIAGNOSIS — N390 Urinary tract infection, site not specified: Secondary | ICD-10-CM | POA: Diagnosis not present

## 2024-03-20 DIAGNOSIS — Z23 Encounter for immunization: Secondary | ICD-10-CM | POA: Diagnosis not present

## 2024-03-20 DIAGNOSIS — E7800 Pure hypercholesterolemia, unspecified: Secondary | ICD-10-CM | POA: Diagnosis not present

## 2024-04-03 DIAGNOSIS — G802 Spastic hemiplegic cerebral palsy: Secondary | ICD-10-CM | POA: Diagnosis not present

## 2024-04-03 DIAGNOSIS — R252 Cramp and spasm: Secondary | ICD-10-CM | POA: Diagnosis not present

## 2024-04-03 DIAGNOSIS — Z87898 Personal history of other specified conditions: Secondary | ICD-10-CM | POA: Diagnosis not present

## 2024-07-15 ENCOUNTER — Encounter: Payer: Self-pay | Admitting: *Deleted

## 2024-07-27 ENCOUNTER — Emergency Department (HOSPITAL_COMMUNITY)

## 2024-07-27 ENCOUNTER — Encounter (HOSPITAL_COMMUNITY): Payer: Self-pay

## 2024-07-27 ENCOUNTER — Other Ambulatory Visit: Payer: Self-pay

## 2024-07-27 ENCOUNTER — Inpatient Hospital Stay (HOSPITAL_COMMUNITY)
Admission: EM | Admit: 2024-07-27 | Discharge: 2024-07-30 | DRG: 897 | Disposition: A | Attending: Family Medicine | Admitting: Family Medicine

## 2024-07-27 DIAGNOSIS — Z8249 Family history of ischemic heart disease and other diseases of the circulatory system: Secondary | ICD-10-CM

## 2024-07-27 DIAGNOSIS — Z881 Allergy status to other antibiotic agents status: Secondary | ICD-10-CM

## 2024-07-27 DIAGNOSIS — F10139 Alcohol abuse with withdrawal, unspecified: Principal | ICD-10-CM | POA: Diagnosis present

## 2024-07-27 DIAGNOSIS — F1021 Alcohol dependence, in remission: Secondary | ICD-10-CM

## 2024-07-27 DIAGNOSIS — G802 Spastic hemiplegic cerebral palsy: Secondary | ICD-10-CM | POA: Diagnosis present

## 2024-07-27 DIAGNOSIS — R9431 Abnormal electrocardiogram [ECG] [EKG]: Secondary | ICD-10-CM | POA: Diagnosis present

## 2024-07-27 DIAGNOSIS — Z91041 Radiographic dye allergy status: Secondary | ICD-10-CM

## 2024-07-27 DIAGNOSIS — Z8049 Family history of malignant neoplasm of other genital organs: Secondary | ICD-10-CM

## 2024-07-27 DIAGNOSIS — F329 Major depressive disorder, single episode, unspecified: Secondary | ICD-10-CM | POA: Diagnosis present

## 2024-07-27 DIAGNOSIS — Z981 Arthrodesis status: Secondary | ICD-10-CM

## 2024-07-27 DIAGNOSIS — Z888 Allergy status to other drugs, medicaments and biological substances status: Secondary | ICD-10-CM

## 2024-07-27 DIAGNOSIS — F10930 Alcohol use, unspecified with withdrawal, uncomplicated: Secondary | ICD-10-CM

## 2024-07-27 DIAGNOSIS — F1090 Alcohol use, unspecified, uncomplicated: Principal | ICD-10-CM

## 2024-07-27 DIAGNOSIS — Z801 Family history of malignant neoplasm of trachea, bronchus and lung: Secondary | ICD-10-CM

## 2024-07-27 DIAGNOSIS — B9620 Unspecified Escherichia coli [E. coli] as the cause of diseases classified elsewhere: Secondary | ICD-10-CM | POA: Diagnosis present

## 2024-07-27 DIAGNOSIS — G40909 Epilepsy, unspecified, not intractable, without status epilepticus: Secondary | ICD-10-CM | POA: Diagnosis present

## 2024-07-27 DIAGNOSIS — N3000 Acute cystitis without hematuria: Secondary | ICD-10-CM

## 2024-07-27 DIAGNOSIS — I10 Essential (primary) hypertension: Secondary | ICD-10-CM | POA: Diagnosis present

## 2024-07-27 DIAGNOSIS — Z79899 Other long term (current) drug therapy: Secondary | ICD-10-CM

## 2024-07-27 DIAGNOSIS — R296 Repeated falls: Secondary | ICD-10-CM | POA: Diagnosis present

## 2024-07-27 DIAGNOSIS — Z883 Allergy status to other anti-infective agents status: Secondary | ICD-10-CM

## 2024-07-27 DIAGNOSIS — F10129 Alcohol abuse with intoxication, unspecified: Secondary | ICD-10-CM | POA: Diagnosis present

## 2024-07-27 DIAGNOSIS — N390 Urinary tract infection, site not specified: Secondary | ICD-10-CM | POA: Diagnosis present

## 2024-07-27 DIAGNOSIS — I4581 Long QT syndrome: Secondary | ICD-10-CM | POA: Diagnosis present

## 2024-07-27 DIAGNOSIS — Z885 Allergy status to narcotic agent status: Secondary | ICD-10-CM

## 2024-07-27 DIAGNOSIS — F10939 Alcohol use, unspecified with withdrawal, unspecified: Secondary | ICD-10-CM | POA: Diagnosis present

## 2024-07-27 LAB — URINALYSIS, ROUTINE W REFLEX MICROSCOPIC
Bilirubin Urine: NEGATIVE
Glucose, UA: NEGATIVE mg/dL
Ketones, ur: NEGATIVE mg/dL
Nitrite: POSITIVE — AB
Protein, ur: NEGATIVE mg/dL
Specific Gravity, Urine: 1.008 (ref 1.005–1.030)
pH: 5 (ref 5.0–8.0)

## 2024-07-27 LAB — PHOSPHORUS: Phosphorus: 4 mg/dL (ref 2.5–4.6)

## 2024-07-27 LAB — CBC
HCT: 44.9 % (ref 36.0–46.0)
Hemoglobin: 14.9 g/dL (ref 12.0–15.0)
MCH: 29.9 pg (ref 26.0–34.0)
MCHC: 33.2 g/dL (ref 30.0–36.0)
MCV: 90.2 fL (ref 80.0–100.0)
Platelets: 283 10*3/uL (ref 150–400)
RBC: 4.98 MIL/uL (ref 3.87–5.11)
RDW: 13.4 % (ref 11.5–15.5)
WBC: 7.3 10*3/uL (ref 4.0–10.5)
nRBC: 0 % (ref 0.0–0.2)

## 2024-07-27 LAB — COMPREHENSIVE METABOLIC PANEL WITH GFR
ALT: 30 U/L (ref 0–44)
AST: 44 U/L — ABNORMAL HIGH (ref 15–41)
Albumin: 4.4 g/dL (ref 3.5–5.0)
Alkaline Phosphatase: 140 U/L — ABNORMAL HIGH (ref 38–126)
Anion gap: 15 (ref 5–15)
BUN: 7 mg/dL — ABNORMAL LOW (ref 8–23)
CO2: 24 mmol/L (ref 22–32)
Calcium: 8.7 mg/dL — ABNORMAL LOW (ref 8.9–10.3)
Chloride: 106 mmol/L (ref 98–111)
Creatinine, Ser: 0.59 mg/dL (ref 0.44–1.00)
GFR, Estimated: >60 mL/min
Glucose, Bld: 94 mg/dL (ref 70–99)
Potassium: 4.2 mmol/L (ref 3.5–5.1)
Sodium: 145 mmol/L (ref 135–145)
Total Bilirubin: 0.2 mg/dL (ref 0.0–1.2)
Total Protein: 7.5 g/dL (ref 6.5–8.1)

## 2024-07-27 LAB — CBG MONITORING, ED
Glucose-Capillary: 80 mg/dL (ref 70–99)
Glucose-Capillary: 84 mg/dL (ref 70–99)

## 2024-07-27 LAB — MAGNESIUM: Magnesium: 2.3 mg/dL (ref 1.7–2.4)

## 2024-07-27 LAB — ETHANOL: Alcohol, Ethyl (B): 323 mg/dL

## 2024-07-27 LAB — SALICYLATE LEVEL: Salicylate Lvl: <7 mg/dL — ABNORMAL LOW (ref 7.0–30.0)

## 2024-07-27 LAB — ACETAMINOPHEN LEVEL: Acetaminophen (Tylenol), Serum: <10 ug/mL — ABNORMAL LOW (ref 10–30)

## 2024-07-27 MED ORDER — LOSARTAN POTASSIUM 50 MG PO TABS
50.0000 mg | ORAL_TABLET | Freq: Every day | ORAL | Status: DC
  Administered 2024-07-28 – 2024-07-30 (×3): 50 mg via ORAL
  Filled 2024-07-27 (×3): qty 1

## 2024-07-27 MED ORDER — CIPROFLOXACIN IN D5W 400 MG/200ML IV SOLN
400.0000 mg | Freq: Two times a day (BID) | INTRAVENOUS | Status: DC
  Administered 2024-07-28: 400 mg via INTRAVENOUS
  Filled 2024-07-27: qty 200

## 2024-07-27 MED ORDER — PHENOBARBITAL SODIUM 130 MG/ML IJ SOLN
130.0000 mg | Freq: Once | INTRAMUSCULAR | Status: AC
  Administered 2024-07-27: 130 mg via INTRAVENOUS
  Filled 2024-07-27: qty 1

## 2024-07-27 MED ORDER — CHLORDIAZEPOXIDE HCL 25 MG PO CAPS
25.0000 mg | ORAL_CAPSULE | ORAL | Status: DC
  Administered 2024-07-30: 25 mg via ORAL
  Filled 2024-07-27: qty 1

## 2024-07-27 MED ORDER — POLYETHYLENE GLYCOL 3350 17 G PO PACK: 17.0000 g | PACK | Freq: Every day | ORAL | Status: DC | PRN

## 2024-07-27 MED ORDER — FOLIC ACID 1 MG PO TABS
1.0000 mg | ORAL_TABLET | Freq: Every day | ORAL | Status: DC
  Administered 2024-07-27 – 2024-07-30 (×4): 1 mg via ORAL
  Filled 2024-07-27 (×4): qty 1

## 2024-07-27 MED ORDER — HYDROXYZINE HCL 25 MG PO TABS: 25.0000 mg | ORAL_TABLET | Freq: Four times a day (QID) | ORAL | Status: DC | PRN

## 2024-07-27 MED ORDER — CHLORDIAZEPOXIDE HCL 25 MG PO CAPS
25.0000 mg | ORAL_CAPSULE | Freq: Three times a day (TID) | ORAL | Status: AC
  Administered 2024-07-29 (×2): 25 mg via ORAL
  Filled 2024-07-27 (×3): qty 1

## 2024-07-27 MED ORDER — THIAMINE HCL 100 MG/ML IJ SOLN: 100.0000 mg | Freq: Every day | INTRAMUSCULAR | Status: DC

## 2024-07-27 MED ORDER — CIPROFLOXACIN IN D5W 400 MG/200ML IV SOLN
400.0000 mg | Freq: Once | INTRAVENOUS | Status: AC
  Administered 2024-07-27: 400 mg via INTRAVENOUS
  Filled 2024-07-27: qty 200

## 2024-07-27 MED ORDER — LOPERAMIDE HCL 2 MG PO CAPS: 2.0000 mg | ORAL_CAPSULE | ORAL | Status: DC | PRN

## 2024-07-27 MED ORDER — LORAZEPAM 1 MG PO TABS
1.0000 mg | ORAL_TABLET | ORAL | Status: DC | PRN
  Administered 2024-07-27: 1 mg via ORAL
  Administered 2024-07-27 – 2024-07-28 (×2): 2 mg via ORAL
  Administered 2024-07-28: 1 mg via ORAL
  Filled 2024-07-27: qty 2
  Filled 2024-07-27 (×4): qty 1

## 2024-07-27 MED ORDER — THIAMINE MONONITRATE 100 MG PO TABS
100.0000 mg | ORAL_TABLET | Freq: Every day | ORAL | Status: DC
  Administered 2024-07-27: 100 mg via ORAL
  Filled 2024-07-27: qty 1

## 2024-07-27 MED ORDER — ACETAMINOPHEN 325 MG PO TABS
650.0000 mg | ORAL_TABLET | Freq: Four times a day (QID) | ORAL | Status: DC | PRN
  Administered 2024-07-27 – 2024-07-29 (×3): 650 mg via ORAL
  Filled 2024-07-27 (×3): qty 2

## 2024-07-27 MED ORDER — ENOXAPARIN SODIUM 40 MG/0.4ML IJ SOSY
40.0000 mg | PREFILLED_SYRINGE | Freq: Every day | INTRAMUSCULAR | Status: DC
  Administered 2024-07-27 – 2024-07-30 (×4): 40 mg via SUBCUTANEOUS
  Filled 2024-07-27 (×4): qty 0.4

## 2024-07-27 MED ORDER — ATORVASTATIN CALCIUM 10 MG PO TABS
10.0000 mg | ORAL_TABLET | Freq: Every day | ORAL | Status: DC
  Administered 2024-07-27 – 2024-07-29 (×3): 10 mg via ORAL
  Filled 2024-07-27 (×3): qty 1

## 2024-07-27 MED ORDER — LACTATED RINGERS IV BOLUS
1000.0000 mL | Freq: Once | INTRAVENOUS | Status: AC
  Administered 2024-07-27: 1000 mL via INTRAVENOUS

## 2024-07-27 MED ORDER — ACETAMINOPHEN 650 MG RE SUPP: 650.0000 mg | Freq: Four times a day (QID) | RECTAL | Status: DC | PRN

## 2024-07-27 MED ORDER — CHLORDIAZEPOXIDE HCL 25 MG PO CAPS
25.0000 mg | ORAL_CAPSULE | Freq: Four times a day (QID) | ORAL | Status: AC
  Administered 2024-07-27 – 2024-07-29 (×6): 25 mg via ORAL
  Filled 2024-07-27 (×5): qty 1

## 2024-07-27 MED ORDER — SODIUM CHLORIDE 0.9% FLUSH
3.0000 mL | Freq: Two times a day (BID) | INTRAVENOUS | Status: DC
  Administered 2024-07-27 – 2024-07-30 (×6): 3 mL via INTRAVENOUS

## 2024-07-27 MED ORDER — ADULT MULTIVITAMIN W/MINERALS CH
1.0000 | ORAL_TABLET | Freq: Every day | ORAL | Status: DC
  Administered 2024-07-27 – 2024-07-30 (×4): 1 via ORAL
  Filled 2024-07-27 (×4): qty 1

## 2024-07-27 MED ORDER — LORAZEPAM 2 MG/ML IJ SOLN
1.0000 mg | INTRAMUSCULAR | Status: DC | PRN
  Administered 2024-07-29: 1 mg via INTRAVENOUS
  Filled 2024-07-27: qty 1

## 2024-07-27 MED ORDER — LEVETIRACETAM 500 MG PO TABS: 500.0000 mg | ORAL_TABLET | Freq: Two times a day (BID) | ORAL | Status: DC

## 2024-07-27 MED ORDER — MIRABEGRON ER 25 MG PO TB24: 25.0000 mg | ORAL_TABLET | Freq: Every day | ORAL | Status: DC

## 2024-07-27 MED ORDER — CHLORDIAZEPOXIDE HCL 25 MG PO CAPS: 25.0000 mg | ORAL_CAPSULE | Freq: Every day | ORAL | Status: DC

## 2024-07-27 NOTE — H&P (Signed)
 " History and Physical   Ashley Bradley FMW:[id number] DOB: 28-Mar-1951 DOA: 07/27/2024  PCP: Marvene Prentice SAUNDERS, FNP   Patient coming from: Home  Chief Complaint: Alcohol  intoxication, concern for early withdrawal  HPI: Ashley Bradley is a 74 y.o. female with medical history significant of hypertension, QT prolongation, depression, cerebral palsy, seizure disorder, history of UTI, alcohol  use, history of alcohol  withdrawal seizure, history of delirium tremens presenting with altered mental status/intoxication.  Patient with known history of alcohol  use with prior withdrawal seizure and delirium tremens.  Has increased alcohol  use recently after the death of her husband 2 months ago.  History provided with assistance of daughter reveals that patient is drinking around 3 boxes of wine per day.  Patient reports some falls at home.  Patient is interested in detox.  She reports early withdrawal type symptoms for her with concern that it feels like she has felt similar to how she felt before she had a seizure in the past in the ED.    Patient denies fevers, chills, chest pain, shortness of breath, abdominal pain, constipation, diarrhea, nausea, vomiting.  ED Course: Vital signs in the ED notable for blood pressure in the 110s-140 systolic.  Lab workup included CMP with calcium  stable at 8.7, AST 44, alk phos 140.  CBC within normal limits.  Ethanol level elevated to 323.  Tylenol  level and aspirin  level normal.  Urinalysis with nitrates, leukocytes, bacteria.  Magnesium  normal.  Chest x-ray showed focal opacity at bases representing atelectasis versus  infiltrate.  CT head and CT C-spine showed no acute abnormality.  Patient status post fusion at C5-C7.  Patient received ciprofloxacin , 1 L IV fluids, phenobarbital  in the ED.  Review of Systems: As per HPI otherwise all other systems reviewed and are negative.  Past Medical History:  Diagnosis Date   Alcoholism (HCC)    Complication of  anesthesia    hard time waking up   CP (cerebral palsy) (HCC)    Depression    Hypertension    PONV (postoperative nausea and vomiting)    Seizures (HCC)    ETOH induced    Past Surgical History:  Procedure Laterality Date   ANTERIOR AND POSTERIOR REPAIR     BACK SURGERY     x 3   BLADDER SURGERY     sling   BREAST ENHANCEMENT SURGERY     ESOPHAGOGASTRODUODENOSCOPY (EGD) WITH PROPOFOL  N/A 10/21/2017   Procedure: ESOPHAGOGASTRODUODENOSCOPY (EGD) WITH PROPOFOL ;  Surgeon: Rosalie Kitchens, MD;  Location: [facility];  Service: Endoscopy;  Laterality: N/A;   TEE WITHOUT CARDIOVERSION N/A 10/24/2017   Procedure: TRANSESOPHAGEAL ECHOCARDIOGRAM (TEE);  Surgeon: Charls Pearla LABOR, MD;  Location: [facility]COPY;  Service: Cardiovascular;  Laterality: N/A;   TUBAL LIGATION      Social History  reports that she has never smoked. She has never used smokeless tobacco. She reports that she does not currently use alcohol  after a past usage of about 70.0 - 84.0 standard drinks of alcohol  per week. She reports that she does not use drugs.  Allergies[1]  Family History  Problem Relation Age of Onset   CAD Mother 17   Heart attack Mother    Lung cancer Father 34   Cervical cancer Sister    Bone cancer Maternal Grandmother    Uterine cancer Neg Hx    Bladder Cancer Neg Hx   Reviewed on admission  Prior to Admission medications  Medication Sig Start Date End Date Taking? Authorizing Provider  atorvastatin  (LIPITOR) 20  MG tablet Take 20 mg by mouth at bedtime. 12/07/21   [provider]  BIOTIN PO Take by mouth.    [provider]  Cholecalciferol (VITAMIN D3) 50 MCG (2000 UT) TABS Take 2,000 Units by mouth daily.    [provider]  estradiol  (ESTRACE ) 0.1 MG/GM vaginal cream Place 0.5 g vaginally 2 (two) times a week. Place 0.5g nightly for two weeks then twice a week after 09/14/23   Guadlupe Dull T, MD  levETIRAcetam  (KEPPRA ) 500 MG tablet Take 1 tablet (500 mg total) by  mouth 2 (two) times daily. Patient taking differently: Take 500 mg by mouth in the morning and at bedtime. 125mg  BID 04/15/20   Penumalli, Vikram R, MD  lidocaine  (XYLOCAINE ) 5 % ointment Use 0.5g peasize over abdomen up to 3 times a day 09/11/23   Guadlupe Dull T, MD  losartan  (COZAAR ) 50 MG tablet Take 50 mg by mouth daily.    [provider]  Meth-Hyo-M Bl-Na Phos-Ph Sal (URIBEL ) 118 MG CAPS Take 1 capsule (118 mg total) by mouth 4 (four) times daily as needed (bladder pain). 09/11/23   Guadlupe Dull DASEN, MD  Multiple Vitamin (MULTIVITAMIN WITH MINERALS) TABS tablet Take 1 tablet by mouth daily. 01/23/16   Rosario Leatrice FERNS, MD  saccharomyces boulardii (FLORASTOR) 250 MG capsule Take 500 mg by mouth every evening.    [provider]  Vibegron  (GEMTESA ) 75 MG TABS Take 1 tablet (75 mg total) by mouth daily. 09/11/23   Guadlupe Dull DASEN, MD  Vibegron  (GEMTESA ) 75 MG TABS Take 1 tablet (75 mg total) by mouth daily. 09/11/23   Guadlupe Dull DASEN, MD    Physical Exam: Vitals:   07/27/24 1241 07/27/24 1242 07/27/24 1315 07/27/24 1600  BP:  (!) 142/81 127/70 118/62  Pulse:  88 93 95  Resp:  20 18 19   Temp:  98.2 F (36.8 C)  98.4 F (36.9 C)  TempSrc:  Oral    SpO2:  99% 99% 99%  Weight: 63.5 kg     Height: 5' 3 (1.6 m)       Physical Exam Constitutional:      General: She is not in acute distress.    Appearance: Normal appearance.  HENT:     Head: Normocephalic and atraumatic.     Mouth/Throat:     Mouth: Mucous membranes are moist.     Pharynx: Oropharynx is clear.  Eyes:     Extraocular Movements: Extraocular movements intact.     Pupils: Pupils are equal, round, and reactive to light.  Cardiovascular:     Rate and Rhythm: Regular rhythm. Tachycardia present.     Pulses: Normal pulses.     Heart sounds: Normal heart sounds.  Pulmonary:     Effort: Pulmonary effort is normal. No respiratory distress.     Breath sounds: Normal breath sounds.  Abdominal:     General: Bowel sounds  are normal. There is no distension.     Palpations: Abdomen is soft.     Tenderness: There is no abdominal tenderness.  Musculoskeletal:        General: No swelling or deformity.  Skin:    General: Skin is warm and dry.  Neurological:     General: No focal deficit present.     Mental Status: Mental status is at baseline.    Labs on Admission: I have personally reviewed following labs and imaging studies  CBC: Recent Labs  Lab 07/27/24 1310  WBC 7.3  HGB 14.9  HCT 44.9  MCV 90.2  PLT 283    Basic Metabolic Panel: Recent Labs  Lab 07/27/24 1310  NA 145  K 4.2  CL 106  CO2 24  GLUCOSE 94  BUN 7*  CREATININE 0.59  CALCIUM  8.7*  MG 2.3    GFR: Estimated Creatinine Clearance: 54.5 mL/min (by C-G formula based on SCr of 0.59 mg/dL).  Liver Function Tests: Recent Labs  Lab 07/27/24 1310  AST 44*  ALT 30  ALKPHOS 140*  BILITOT 0.2  PROT 7.5  ALBUMIN 4.4    Urine analysis:    Component Value Date/Time   COLORURINE YELLOW 07/27/2024 1544   APPEARANCEUR HAZY (A) 07/27/2024 1544   LABSPEC 1.008 07/27/2024 1544   PHURINE 5.0 07/27/2024 1544   GLUCOSEU NEGATIVE 07/27/2024 1544   HGBUR SMALL (A) 07/27/2024 1544   BILIRUBINUR NEGATIVE 07/27/2024 1544   BILIRUBINUR Negative 09/11/2023 0828   KETONESUR NEGATIVE 07/27/2024 1544   PROTEINUR NEGATIVE 07/27/2024 1544   UROBILINOGEN 0.2 09/11/2023 0828   UROBILINOGEN 1.0 09/23/2013 0732   NITRITE POSITIVE (A) 07/27/2024 1544   LEUKOCYTESUR MODERATE (A) 07/27/2024 1544    Radiological Exams on Admission: CT Cervical Spine Wo Contrast Result Date: 07/27/2024 EXAM: CT CERVICAL SPINE WITHOUT CONTRAST 07/27/2024 03:10:49 PM TECHNIQUE: CT of the cervical spine was performed without the administration of intravenous contrast. Multiplanar reformatted images are provided for review. Automated exposure control, iterative reconstruction, and/or weight based adjustment of the mA/kV was utilized to reduce the radiation dose to  as low as reasonably achievable. COMPARISON: None available. CLINICAL HISTORY: Neck trauma (Age >= 65y). Neck trauma. Age greater than or equal to 65 years. FINDINGS: BONES AND ALIGNMENT: Anterior cervical fusion hardware at C5-C7 with solid osseous fusion at these levels. Cerclage wires over the spinous processes of C5-C7. Hardware is intact. Straightening of the normal cervical lordosis. No evidence of traumatic malalignment. No acute fracture. DEGENERATIVE CHANGES: Mild disc space narrowing at C4-C5. Facet arthrosis most pronounced on the left at C2-C3 and C3-C4. Foraminal stenosis at multiple levels, most pronounced at C3-C4. No high grade osseous spinal canal stenosis in the cervical spine. SOFT TISSUES: No prevertebral soft tissue swelling. IMPRESSION: 1. No evidence of acute traumatic injury. 2. Anterior cervical fusion hardware at C5 to C7 with solid osseous fusion at these levels and cerclage wires over the spinous processes of C5 to C7. Hardware is intact. Electronically signed by: Donnice Mania MD 07/27/2024 04:12 PM EST RP Workstation: [id number]   CT Head Wo Contrast Result Date: 07/27/2024 EXAM: CT HEAD WITHOUT CONTRAST 07/27/2024 03:10:49 PM TECHNIQUE: CT of the head was performed without the administration of intravenous contrast. Automated exposure control, iterative reconstruction, and/or weight based adjustment of the mA/kV was utilized to reduce the radiation dose to as low as reasonably achievable. COMPARISON: 05/22/2021 CLINICAL HISTORY: Head trauma, minor (Age >= 65 years). FINDINGS: BRAIN AND VENTRICLES: No acute hemorrhage. No evidence of acute infarct. No hydrocephalus. No extra-axial collection. No mass effect or midline shift. Hypoattenuation in the bilateral frontal lobe white matter suggestive of chronic microvascular ischemic changes. Bilateral basal ganglia mineralization. ORBITS: No acute abnormality. SINUSES: No acute abnormality. SOFT TISSUES AND SKULL: No acute soft tissue  abnormality. No skull fracture. IMPRESSION: 1. No acute intracranial abnormality. Electronically signed by: Donnice Mania MD 07/27/2024 04:04 PM EST RP Workstation: [id number]   DG Chest Portable 1 View Result Date: 07/27/2024 CLINICAL DATA:  Fall. EXAM: PORTABLE CHEST 1 VIEW COMPARISON:  04/05/2022. FINDINGS: The heart size and mediastinal contour are within  normal limits. Focal opacities are noted in the medial aspect of the lung bases bilaterally. No consolidation, effusion, or pneumothorax is seen. Cervical spinal fusion hardware is noted. No acute osseous abnormality. IMPRESSION: Focal opacities in the medial aspect of the lung bases bilaterally, possible atelectasis or infiltrate. Short-term follow-up is recommended to exclude the possibility of underlying mass. Electronically Signed   By: Leita Birmingham M.D.   On: 07/27/2024 14:21   EKG: Independently reviewed.  Sinus rhythm at 92 bpm.  Some PVCs noted.  QTc okay currently at 474.  Baseline artifact noted.  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  Increased drinking after the death of her husband 2 months ago.  Reportedly drinking 3 boxes of boxed wine per day. > History of prior alcohol  withdrawal with seizure and delirium tremens.  States that she feels some symptoms similar to those that preceded previous seizures. > Ethanol level initially significantly elevated in the ED at 323.  Other workup with no acute abnormality apart from urinary studies as below. > Did receive phenobarbital  in the ED. - Monitor  in progressive overnight given patient's history of seizures and delirium tremens - Will start Librium  detox taper - CIWA with Ativan  for breakthrough symptoms - Thiamine , folate, multivitamin - Supportive care  Seizure disorder > History of seizure disorder and history of withdrawal seizures.  Admit for early withdrawal and plan for detox. > States she has been tapering off keppra  out patient but agreeable to continue it in the hospital with risk for withdrawal seizure. - Continue with home Keppra  - Has as needed Ativan  available as above - Seizure precautions  UTI > Urinalysis concerning for UTI with nitrates, leukocytes, bacteria.  Patient does have a history of recurrent UTI.  No leukocytosis. > Started on ciprofloxacin  due to allergy to Ancef  with significant reaction. - Continue with ciprofloxacin  - Follow-up urine culture  QT prolongation > QTC currently okay at 474, but will monitor. - On telemetry - AM EKG  Hypertension - Continue home losartan   Depression Cerebral palsy - Noted  DVT prophylaxis: Lovenox  Code Status:   Full Family Communication:  None on admission (EDP did speak with daughter earlier)  Disposition Plan:   Patient is from:  Home  Anticipated DC to:  Home  Anticipated DC date:  1 to 3 days  Anticipated DC barriers: None  Consults called:  None Admission status:  Observation, progressive  Severity of Illness: The appropriate patient status for this patient is OBSERVATION. Observation status is judged to be reasonable and necessary in order to provide the required intensity of service to ensure the patient's safety. The patient's presenting symptoms, physical exam findings, and initial radiographic and laboratory data in the context of their medical condition is felt to place them at decreased risk for further clinical deterioration. Furthermore, it is anticipated that the patient will be medically stable for discharge from the hospital within 2  midnights of admission.    Marsa KATHEE Scurry MD [facility]  How to contact the [facility] Attending or Consulting provider 7A - 7P or covering provider during after hours  7P -7A, for this patient?   Check the care team in [facility] and look for a) attending/consulting [facility] provider listed and b) the [facility] team listed Log into [email or web address] and use [place]'s universal password to access. If you do not have the password, please contact the hospital operator. Locate the [facility] provider you are looking for under [facility] and page to a number that you can be directly reached. If you still have difficulty reaching the provider, please page the [facility] (Director on Call) for the Hospitalists listed on amion for assistance.  07/27/2024, 5:09 PM       [1]  Allergies Allergen Reactions   Ancef  [Cefazolin ] Other (See Comments)    Skin blisters, peels Has tolerated Unasyn , Rocephin , Zosyn  previously   Pantopaque [Iophendylate] Other (See Comments)    Aseptic meningitis.   Macrodantin [Nitrofurantoin] Nausea And Vomiting   Morphine  And Codeine Nausea And Vomiting    Per patient, only has reaction when given IV. Okay to take PO.   "

## 2024-07-27 NOTE — ED Triage Notes (Addendum)
 Pt BIB [facility]. They were called out for AMS. On scene they noted 3 empty bottles of wine about the pt. Pt has a hx of significant ETOH use. She has had a recent loss of a spouse and has been drinking more than usual. Pt is alert and confused on arrival.

## 2024-07-27 NOTE — ED Notes (Signed)
 EDP at [facility] speaking with pt and family. Pt alert, NAD, calm, interactive, states, feels like she is going to have a sz.

## 2024-07-27 NOTE — ED Provider Notes (Signed)
 " [facility] AT [facility] Provider Note   CSN: [id number] Arrival date & time: 07/27/24  1229     Patient presents with: Altered Mental Status   Ashley Bradley is a 74 y.o. female.  With a history of alcohol  use disorder and severe withdrawal who presents to the ED for alcohol  intoxication.  Patient suffered the loss of her husband 2 months ago and has been drinking more than usual since then.  She tells me she drinks 3 glasses of wine daily but her daughter at bedside states it is much more approximately 3 boxes of boxed wine.  Daughter went home to check on the patient today who lives alone and noted her to be obtunded.  She was able to call EMS and have her transported here for further evaluation.  The patient herself acknowledges her drinking is out of hand and voices concern for recurrent withdrawal seizures and DTs which she has experienced in the past.  Denies SI HI.  No other coingestions.  Multiple falls at home recently but she denies significant injury from these      Altered Mental Status      Prior to Admission medications  Medication Sig Start Date End Date Taking? Authorizing Provider  atorvastatin  (LIPITOR) 20 MG tablet Take 20 mg by mouth at bedtime. 12/07/21   [provider]  BIOTIN PO Take by mouth.    [provider]  Cholecalciferol (VITAMIN D3) 50 MCG (2000 UT) TABS Take 2,000 Units by mouth daily.    [provider]  estradiol  (ESTRACE ) 0.1 MG/GM vaginal cream Place 0.5 g vaginally 2 (two) times a week. Place 0.5g nightly for two weeks then twice a week after 09/14/23   Guadlupe Dull T, MD  levETIRAcetam  (KEPPRA ) 500 MG tablet Take 1 tablet (500 mg total) by mouth 2 (two) times daily. Patient taking differently: Take 500 mg by mouth in the morning and at bedtime. 125mg  BID 04/15/20   Penumalli, Vikram R, MD  lidocaine  (XYLOCAINE ) 5 % ointment Use 0.5g peasize over abdomen up to 3 times a day 09/11/23   Guadlupe Dull  T, MD  losartan  (COZAAR ) 50 MG tablet Take 50 mg by mouth daily.    [provider]  Meth-Hyo-M Bl-Na Phos-Ph Sal (URIBEL ) 118 MG CAPS Take 1 capsule (118 mg total) by mouth 4 (four) times daily as needed (bladder pain). 09/11/23   Guadlupe Dull DASEN, MD  Multiple Vitamin (MULTIVITAMIN WITH MINERALS) TABS tablet Take 1 tablet by mouth daily. 01/23/16   Rosario Leatrice FERNS, MD  saccharomyces boulardii (FLORASTOR) 250 MG capsule Take 500 mg by mouth every evening.    [provider]  Vibegron  (GEMTESA ) 75 MG TABS Take 1 tablet (75 mg total) by mouth daily. 09/11/23   Guadlupe Dull DASEN, MD  Vibegron  (GEMTESA ) 75 MG TABS Take 1 tablet (75 mg total) by mouth daily. 09/11/23   Guadlupe Dull DASEN, MD    Allergies: Ancef  [cefazolin ], Pantopaque [iophendylate], Macrodantin [nitrofurantoin], and Morphine  and codeine    Review of Systems  Updated Vital Signs BP 118/62   Pulse 95   Temp 98.4 F (36.9 C)   Resp 19   Ht 5' 3 (1.6 m)   Wt 63.5 kg   SpO2 99%   BMI 24.80 kg/m   Physical Exam Vitals and nursing note reviewed.  HENT:     Head: Normocephalic and atraumatic.     Mouth/Throat:     Comments: Dark-colored ring around lips and  dark purple discoloration of tongue Eyes:     Pupils: Pupils are equal, round, and reactive to light.  Cardiovascular:     Rate and Rhythm: Normal rate and regular rhythm.  Pulmonary:     Effort: Pulmonary effort is normal.     Breath sounds: Normal breath sounds.  Abdominal:     Palpations: Abdomen is soft.     Tenderness: There is no abdominal tenderness.  Musculoskeletal:     Cervical back: Neck supple. No tenderness.     Comments: 5 out of 5 motor strength bilateral upper and lower extremities with no evidence of trauma  Skin:    General: Skin is warm and dry.  Neurological:     Mental Status: She is alert.  Psychiatric:        Mood and Affect: Mood normal.     (all labs ordered are listed, but only abnormal results are displayed) Labs Reviewed   COMPREHENSIVE METABOLIC PANEL WITH GFR - Abnormal; Notable for the following components:      Result Value   BUN 7 (*)    Calcium  8.7 (*)    AST 44 (*)    Alkaline Phosphatase 140 (*)    All other components within normal limits  URINALYSIS, ROUTINE W REFLEX MICROSCOPIC - Abnormal; Notable for the following components:   APPearance HAZY (*)    Hgb urine dipstick SMALL (*)    Nitrite POSITIVE (*)    Leukocytes,Ua MODERATE (*)    Bacteria, UA RARE (*)    All other components within normal limits  ETHANOL - Abnormal; Notable for the following components:   Alcohol , Ethyl (B) 323 (*)    All other components within normal limits  ACETAMINOPHEN  LEVEL - Abnormal; Notable for the following components:   Acetaminophen  (Tylenol ), Serum <10 (*)    All other components within normal limits  SALICYLATE LEVEL - Abnormal; Notable for the following components:   Salicylate Lvl <7.0 (*)    All other components within normal limits  CBC  MAGNESIUM   CBG MONITORING, ED  CBG MONITORING, ED    EKG: EKG Interpretation Date/Time:  Saturday July 27 2024 12:59:41 EST Ventricular Rate:  92 PR Interval:  134 QRS Duration:  96 QT Interval:  383 QTC Calculation: 474 R Axis:   57  Text Interpretation: Sinus rhythm Ventricular premature complex Confirmed by Pamella Sharper [phone]) on 07/27/2024 4:34:28 PM  Radiology: CT Cervical Spine Wo Contrast Result Date: 07/27/2024 EXAM: CT CERVICAL SPINE WITHOUT CONTRAST 07/27/2024 03:10:49 PM TECHNIQUE: CT of the cervical spine was performed without the administration of intravenous contrast. Multiplanar reformatted images are provided for review. Automated exposure control, iterative reconstruction, and/or weight based adjustment of the mA/kV was utilized to reduce the radiation dose to as low as reasonably achievable. COMPARISON: None available. CLINICAL HISTORY: Neck trauma (Age >= 65y). Neck trauma. Age greater than or equal to 65 years. FINDINGS: BONES AND  ALIGNMENT: Anterior cervical fusion hardware at C5-C7 with solid osseous fusion at these levels. Cerclage wires over the spinous processes of C5-C7. Hardware is intact. Straightening of the normal cervical lordosis. No evidence of traumatic malalignment. No acute fracture. DEGENERATIVE CHANGES: Mild disc space narrowing at C4-C5. Facet arthrosis most pronounced on the left at C2-C3 and C3-C4. Foraminal stenosis at multiple levels, most pronounced at C3-C4. No high grade osseous spinal canal stenosis in the cervical spine. SOFT TISSUES: No prevertebral soft tissue swelling. IMPRESSION: 1. No evidence of acute traumatic injury. 2. Anterior cervical fusion hardware at C5 to  C7 with solid osseous fusion at these levels and cerclage wires over the spinous processes of C5 to C7. Hardware is intact. Electronically signed by: Donnice Mania MD 07/27/2024 04:12 PM EST RP Workstation: [id number]   CT Head Wo Contrast Result Date: 07/27/2024 EXAM: CT HEAD WITHOUT CONTRAST 07/27/2024 03:10:49 PM TECHNIQUE: CT of the head was performed without the administration of intravenous contrast. Automated exposure control, iterative reconstruction, and/or weight based adjustment of the mA/kV was utilized to reduce the radiation dose to as low as reasonably achievable. COMPARISON: 05/22/2021 CLINICAL HISTORY: Head trauma, minor (Age >= 65 years). FINDINGS: BRAIN AND VENTRICLES: No acute hemorrhage. No evidence of acute infarct. No hydrocephalus. No extra-axial collection. No mass effect or midline shift. Hypoattenuation in the bilateral frontal lobe white matter suggestive of chronic microvascular ischemic changes. Bilateral basal ganglia mineralization. ORBITS: No acute abnormality. SINUSES: No acute abnormality. SOFT TISSUES AND SKULL: No acute soft tissue abnormality. No skull fracture. IMPRESSION: 1. No acute intracranial abnormality. Electronically signed by: Donnice Mania MD 07/27/2024 04:04 PM EST RP Workstation: [id number]   DG  Chest Portable 1 View Result Date: 07/27/2024 CLINICAL DATA:  Fall. EXAM: PORTABLE CHEST 1 VIEW COMPARISON:  04/05/2022. FINDINGS: The heart size and mediastinal contour are within normal limits. Focal opacities are noted in the medial aspect of the lung bases bilaterally. No consolidation, effusion, or pneumothorax is seen. Cervical spinal fusion hardware is noted. No acute osseous abnormality. IMPRESSION: Focal opacities in the medial aspect of the lung bases bilaterally, possible atelectasis or infiltrate. Short-term follow-up is recommended to exclude the possibility of underlying mass. Electronically Signed   By: Leita Birmingham M.D.   On: 07/27/2024 14:21     Procedures   Medications Ordered in the ED  ciprofloxacin  (CIPRO ) IVPB 400 mg (has no administration in time range)  lactated ringers  bolus 1,000 mL (1,000 mLs Intravenous New Bag/Given 07/27/24 1322)  PHENObarbital  (LUMINAL) injection 130 mg (130 mg Intravenous Given 07/27/24 1409)    Clinical Course as of 07/27/24 1635  Sat Jul 27, 2024  1633 No significant electrolyte abnormalities on metabolic panel.  CBC shows no leukocytosis or severe anemia.  UA is consistent with nitrite positive UTI.  Will cover with ceftriaxone .  Blood alcohol  level initially 323.  1 dose of phenobarb given and monitoring on CIWA.  No severe withdrawal symptoms at this time.  CT head C-spine showed no acute findings.  Chest x-ray more likely atelectasis and pneumonia.  Will admit to hospitalist service for acute alcohol  withdrawal and initiation of medical detox [MP]    Clinical Course User Index [MP] Pamella Ozell LABOR, DO                                 Medical Decision Making 74 year old female with history as above presenting for acute altered mental status in the setting of alcohol  intoxication.  Has been drinking copiously since the loss of her husband 2 months ago.  She does have a history of severe withdrawal symptoms including DTs and seizures.  No  recent seizure activity reported.  Multiple falls at home recently and she lives alone.  Daughter reports she drinks somewhere around 3 boxes of red wine daily.  Last drink was this morning.  Acutely intoxicated but cooperative.  No HI SI.  Will require medical admission for alcohol  detox with plan for social work/case management evaluation thereafter to determine safest disposition  Amount and/or Complexity of Data Reviewed  Labs: ordered. Radiology: ordered.  Risk Prescription drug management. Decision regarding hospitalization.        Final diagnoses:  Alcohol  use disorder  Urinary tract infection without hematuria, site unspecified    ED Discharge Orders     None          Pamella Ozell LABOR, DO 07/27/24 1635  "

## 2024-07-28 DIAGNOSIS — F329 Major depressive disorder, single episode, unspecified: Secondary | ICD-10-CM | POA: Diagnosis present

## 2024-07-28 DIAGNOSIS — Z8249 Family history of ischemic heart disease and other diseases of the circulatory system: Secondary | ICD-10-CM | POA: Diagnosis not present

## 2024-07-28 DIAGNOSIS — F1090 Alcohol use, unspecified, uncomplicated: Principal | ICD-10-CM

## 2024-07-28 DIAGNOSIS — F10129 Alcohol abuse with intoxication, unspecified: Secondary | ICD-10-CM | POA: Diagnosis present

## 2024-07-28 DIAGNOSIS — Z885 Allergy status to narcotic agent status: Secondary | ICD-10-CM | POA: Diagnosis not present

## 2024-07-28 DIAGNOSIS — Z981 Arthrodesis status: Secondary | ICD-10-CM | POA: Diagnosis not present

## 2024-07-28 DIAGNOSIS — Z91041 Radiographic dye allergy status: Secondary | ICD-10-CM | POA: Diagnosis not present

## 2024-07-28 DIAGNOSIS — N390 Urinary tract infection, site not specified: Secondary | ICD-10-CM | POA: Diagnosis present

## 2024-07-28 DIAGNOSIS — Z801 Family history of malignant neoplasm of trachea, bronchus and lung: Secondary | ICD-10-CM | POA: Diagnosis not present

## 2024-07-28 DIAGNOSIS — G40909 Epilepsy, unspecified, not intractable, without status epilepticus: Secondary | ICD-10-CM | POA: Diagnosis present

## 2024-07-28 DIAGNOSIS — Z79899 Other long term (current) drug therapy: Secondary | ICD-10-CM | POA: Diagnosis not present

## 2024-07-28 DIAGNOSIS — Z883 Allergy status to other anti-infective agents status: Secondary | ICD-10-CM | POA: Diagnosis not present

## 2024-07-28 DIAGNOSIS — Z888 Allergy status to other drugs, medicaments and biological substances status: Secondary | ICD-10-CM | POA: Diagnosis not present

## 2024-07-28 DIAGNOSIS — R296 Repeated falls: Secondary | ICD-10-CM | POA: Diagnosis present

## 2024-07-28 DIAGNOSIS — F10139 Alcohol abuse with withdrawal, unspecified: Secondary | ICD-10-CM | POA: Diagnosis present

## 2024-07-28 DIAGNOSIS — Z8049 Family history of malignant neoplasm of other genital organs: Secondary | ICD-10-CM | POA: Diagnosis not present

## 2024-07-28 DIAGNOSIS — B9620 Unspecified Escherichia coli [E. coli] as the cause of diseases classified elsewhere: Secondary | ICD-10-CM | POA: Diagnosis present

## 2024-07-28 DIAGNOSIS — I4581 Long QT syndrome: Secondary | ICD-10-CM | POA: Diagnosis present

## 2024-07-28 DIAGNOSIS — Z881 Allergy status to other antibiotic agents status: Secondary | ICD-10-CM | POA: Diagnosis not present

## 2024-07-28 DIAGNOSIS — I10 Essential (primary) hypertension: Secondary | ICD-10-CM | POA: Diagnosis present

## 2024-07-28 DIAGNOSIS — R4182 Altered mental status, unspecified: Secondary | ICD-10-CM | POA: Diagnosis present

## 2024-07-28 DIAGNOSIS — G802 Spastic hemiplegic cerebral palsy: Secondary | ICD-10-CM | POA: Diagnosis present

## 2024-07-28 LAB — CBC
HCT: 41.7 % (ref 36.0–46.0)
Hemoglobin: 13.5 g/dL (ref 12.0–15.0)
MCH: 29.7 pg (ref 26.0–34.0)
MCHC: 32.4 g/dL (ref 30.0–36.0)
MCV: 91.9 fL (ref 80.0–100.0)
Platelets: 237 10*3/uL (ref 150–400)
RBC: 4.54 MIL/uL (ref 3.87–5.11)
RDW: 13.3 % (ref 11.5–15.5)
WBC: 7.5 10*3/uL (ref 4.0–10.5)
nRBC: 0 % (ref 0.0–0.2)

## 2024-07-28 LAB — COMPREHENSIVE METABOLIC PANEL WITH GFR
ALT: 25 U/L (ref 0–44)
AST: 31 U/L (ref 15–41)
Albumin: 3.9 g/dL (ref 3.5–5.0)
Alkaline Phosphatase: 126 U/L (ref 38–126)
Anion gap: 8 (ref 5–15)
BUN: 13 mg/dL (ref 8–23)
CO2: 29 mmol/L (ref 22–32)
Calcium: 9 mg/dL (ref 8.9–10.3)
Chloride: 104 mmol/L (ref 98–111)
Creatinine, Ser: 0.87 mg/dL (ref 0.44–1.00)
GFR, Estimated: >60 mL/min
Glucose, Bld: 99 mg/dL (ref 70–99)
Potassium: 4.9 mmol/L (ref 3.5–5.1)
Sodium: 141 mmol/L (ref 135–145)
Total Bilirubin: 0.6 mg/dL (ref 0.0–1.2)
Total Protein: 6.5 g/dL (ref 6.5–8.1)

## 2024-07-28 MED ORDER — SULFAMETHOXAZOLE-TRIMETHOPRIM 800-160 MG PO TABS
1.0000 | ORAL_TABLET | Freq: Two times a day (BID) | ORAL | Status: DC
  Administered 2024-07-28 – 2024-07-30 (×4): 1 via ORAL
  Filled 2024-07-28 (×4): qty 1

## 2024-07-28 NOTE — TOC Initial Note (Signed)
 Transition of Care [facility]) - Initial/Assessment Note    Patient Details  Name: Ashley Bradley MRN: [id number] Date of Birth: 11-21-1950  Transition of [facility]) CM/SW Contact:    Heather DELENA Saltness, LCSW Phone Number: 07/28/2024, 1:44 PM  Clinical Narrative:                 Pt admitted to the hospital for AMS and alcohol  intoxication with concern for early withdrawal. Pt has prior history of alcohol  use with withdrawal seizures and delirium tremens. Pt states her alcohol  intake has been increasing since the death of her spouse 2 months ago. CSW offered resources and support, pt declined stating I've been in the program (AA) before and I know what I need to do to get back where I was. CSW spoke with pt's daughter, Reche Bologna [phone], via phone call to discuss discharge planning and provide update. Daughter shared concern of pt living alone, drinking 3 boxes of wine per day, since death of her husband. Daughter inquired about 30-day rehab or other resources besides AA. Daughter states She used to be a nurse so she knows how to play the system. She'll say yeah I know what to do or I'll go to a program, but she doesn't follow through. Daughter reports she can transport pt home upon discharge, pending improvement in weather (daughter lives in [place]). PT evaluation pending. TOC will continue to follow.    Expected Discharge Plan: Home/Self Care Barriers to Discharge: Continued Medical Work up   Patient Goals and CMS Choice Patient states their goals for this hospitalization and ongoing recovery are:: To return home   Choice offered to / list presented to : NA [place] ownership interest in [facility].provided to:: Parent NA    Expected Discharge Plan and Services In-house Referral: Clinical Social Work Discharge Planning Services: NA Post Acute Care Choice: NA Living arrangements for the past 2 months: Single Family Home                 DME Arranged: N/A DME  Agency: NA       HH Arranged: NA HH Agency: NA        Prior Living Arrangements/Services Living arrangements for the past 2 months: Single Family Home Lives with:: Self Patient language and need for interpreter reviewed:: Yes Do you feel safe going back to the place where you live?: Yes      Need for Family Participation in Patient Care: Yes (Comment) Care giver support system in place?: No (comment)   Criminal Activity/Legal Involvement Pertinent to Current Situation/Hospitalization: No - Comment as needed  Activities of Daily Living   ADL Screening (condition at time of admission) Independently performs ADLs?: Yes (appropriate for developmental age) Is the patient deaf or have difficulty hearing?: No Does the patient have difficulty seeing, even when wearing glasses/contacts?: No Does the patient have difficulty concentrating, remembering, or making decisions?: No  Permission Sought/Granted Permission sought to share information with : Family Supports Permission granted to share information with : Yes, Verbal Permission Granted  Share Information with NAME: Caitlyn     Permission granted to share info w Relationship: Daughter  Permission granted to share info w Contact Information: [phone]  Emotional Assessment Appearance::  (UTA) Attitude/Demeanor/Rapport: Unable to Assess Affect (typically observed): Unable to Assess Orientation: : Oriented to Self, Oriented to Place, Oriented to  Time, Oriented to Situation Alcohol  / Substance Use: Alcohol  Use Psych Involvement: No (comment)  Admission diagnosis:  Alcohol  withdrawal (HCC) [F10.939]  Urinary tract infection without hematuria, site unspecified [N39.0] Alcohol  use disorder [F10.90] Patient Active Problem List   Diagnosis Date Noted   Alcohol  use disorder 07/28/2024   UTI (urinary tract infection) 07/27/2024   Alcohol  withdrawal (HCC) 07/27/2024   Nocturia 09/11/2023   Urinary incontinence, mixed 09/11/2023    Incontinence of feces 09/11/2023   Pelvic pain 09/11/2023   Pelvic organ prolapse quantification stage 2 cystocele 09/11/2023   Cough 04/05/2022   Dermatitis 03/22/2022   Physical deconditioning 03/22/2022   Broken tooth 03/22/2022   Essential hypertension 05/12/2021   Transaminitis 05/12/2021   Total bilirubin, elevated 05/12/2021   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/2020   H/O febrile seizure 04/15/2020   DTs (delirium tremens) (HCC) 01/24/2020   Alcohol  withdrawal seizure (HCC) 01/24/2020   History of pelvic surgery 11/10/2017   High anion gap metabolic acidosis 10/13/2017   Melena 06/29/2016   Syncope 01/26/2016   Prolonged Q-T interval on ECG 01/26/2016   History of Clostridium difficile colitis 01/26/2016   Elevated troponin    Alcohol  use disorder, moderate, in early remission, dependence (HCC) 01/21/2016   AKI (acute kidney injury) 01/16/2016   Seizure disorder (HCC) 09/24/2013   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 [place] [address] Phone: [phone] Fax: [phone]   Social Drivers of Health (SDOH) Social History: SDOH Screenings   Food Insecurity: No Food Insecurity (07/27/2024)  Housing: Low Risk (07/27/2024)  Transportation Needs: No Transportation Needs (07/27/2024)  Utilities: Not At Risk (07/27/2024)  Financial Resource Strain: Medium Risk (10/10/2023)   Received from [facility]  Physical Activity: Inactive (10/10/2023)   Received from [facility]  Social Connections: Moderately Integrated (07/27/2024)  Recent Concern: Social Connections - Moderately Isolated (07/27/2024)  Stress: Stress Concern Present (10/10/2023)   Received from [facility]  Tobacco Use: Low Risk (07/27/2024)   SDOH  Interventions: None     Readmission Risk Interventions     No data to display          Signed: Heather Saltness, MSW, LCSW Clinical Social Worker Inpatient Care Management 07/28/2024 2:06 PM

## 2024-07-28 NOTE — Progress Notes (Signed)
 " PROGRESS NOTE    Ashley Bradley  FMW:[id number] DOB: 1951-04-24 DOA: 07/27/2024 PCP: Marvene Prentice SAUNDERS, FNP  Chief Complaint  Patient presents with   Altered Mental Status    Brief Narrative:   Ashley Bradley is Ashley Bradley 74 y.o. female with medical history significant of hypertension, QT prolongation, depression, cerebral palsy, seizure disorder, history of UTI, alcohol  use, history of alcohol  withdrawal seizure, history of delirium tremens presenting with altered mental status/intoxication.   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 S/p phenobarb in ED Continue librium  taper, ativan  prn.  CIWA peaked at 11 last night. She was sober 3 years before relapsing.  Encourage abstinence.   Mechanical Fall CT head, neck without acute findings In setting of above Therapy evaluation  Seizure disorder She reports having been tapered off of keppra  Seizure free since 2023 per 04/2024 neurology note.  At that time, no need to restart keppra  Seizure precautions in setting of binge drinking and risk of withdrawal  Cerebral Palsy  Spasticity  Cramps Has R sided weakness related to this  UTI Urinalysis concerning for UTI with nitrates, leukocytes, bacteria.  Patient does have Ashley Bradley history of recurrent UTI.  No leukocytosis. She notes concerning symptoms for  UTI Will transition to bactrim  to avoid FQ if possible  QT prolongation Improved to 464 Caution with QT prolonging meds   Hypertension - Continue home losartan    Depression Grief Response Denies SI.  Offered to contact chaplain, she's not currently interested.    DVT prophylaxis: lovenox  Code Status: full Family Communication: none Disposition:   Status is: Observation The patient remains OBS appropriate and will d/c before 2 midnights.   Consultants:  none  Procedures:  none  Antimicrobials:  Anti-infectives (From admission, onward)    Start     Dose/Rate Route Frequency Ordered Stop   07/28/24 0600  ciprofloxacin  (CIPRO ) IVPB 400 mg        400 mg 200 mL/hr over 60 Minutes Intravenous Every 12 hours 07/27/24 1702     07/27/24 1645  ciprofloxacin  (CIPRO ) IVPB 400 mg        400 mg 200 mL/hr over 60 Minutes Intravenous  Once 07/27/24 1635 07/27/24 2009       Subjective: No new complaints Husband died 2 months ago - started drinking after 3 months sobriety - ~1.5 weeks, drinking 3 boxes of boxed wine (about 9 glasses of wine) daily.   Objective: Vitals:   07/27/24 2007 07/27/24 2017 07/27/24 2230 07/28/24 0511  BP: (!) 118/53 (!) 116/57 (!) 116/57 (!) 101/57  Pulse: (!) 105 (!) 102 (!) 102 82  Resp: 20  18   Temp: 98.5 F (36.9 C)  98.6 F (37 C) 98.2 F (36.8 C)  TempSrc:   Oral   SpO2: 92%  94% 91%  Weight:      Height:  Intake/Output Summary (Last 24 hours) at 07/28/2024 0908 Last data filed at 07/28/2024 9482 Gross per 24 hour  Intake 1200 ml  Output 500 ml  Net 700 ml   Filed Weights   07/27/24 1241  Weight: 63.5 kg    Examination:  General exam: Appears calm and comfortable  Respiratory system: unlabored Cardiovascular system: RRR Central nervous system: Alert and oriented. Chronic R sided weakness.  Mild tremor. Extremities: no LEE   Data Reviewed: I have personally reviewed following labs and imaging studies  CBC: Recent  Labs  Lab 07/27/24 1310 07/28/24 0525  WBC 7.3 7.5  HGB 14.9 13.5  HCT 44.9 41.7  MCV 90.2 91.9  PLT 283 237    Basic Metabolic Panel: Recent Labs  Lab 07/27/24 1310 07/28/24 0525  NA 145 141  K 4.2 4.9  CL 106 104  CO2 24 29  GLUCOSE 94 99  BUN 7* 13  CREATININE 0.59 0.87  CALCIUM  8.7* 9.0  MG 2.3  --   PHOS 4.0  --     GFR: Estimated Creatinine Clearance: 50.1 mL/min (by C-G formula based on SCr of 0.87 mg/dL).  Liver Function Tests: Recent Labs  Lab 07/27/24 1310 07/28/24 0525  AST 44* 31  ALT 30 25  ALKPHOS 140* 126  BILITOT 0.2 0.6  PROT 7.5 6.5  ALBUMIN 4.4 3.9    CBG: Recent Labs  Lab 07/27/24 1308 07/27/24 1309  GLUCAP 80 84     No results found for this or any previous visit (from the past 240 hours).       Radiology Studies: CT Cervical Spine Wo Contrast Result Date: 07/27/2024 EXAM: CT CERVICAL SPINE WITHOUT CONTRAST 07/27/2024 03:10:49 PM TECHNIQUE: CT of the cervical spine was performed without the administration of intravenous contrast. Multiplanar reformatted images are provided for review. Automated exposure control, iterative reconstruction, and/or weight based adjustment of the mA/kV was utilized to reduce the radiation dose to as low as reasonably achievable. COMPARISON: None available. CLINICAL HISTORY: Neck trauma (Age >= 65y). Neck trauma. Age greater than or equal to 65 years. FINDINGS: BONES AND ALIGNMENT: Anterior cervical fusion hardware at C5-C7 with solid osseous fusion at these levels. Cerclage wires over the spinous processes of C5-C7. Hardware is intact. Straightening of the normal cervical lordosis. No evidence of traumatic malalignment. No acute fracture. DEGENERATIVE CHANGES: Mild disc space narrowing at C4-C5. Facet arthrosis most pronounced on the left at C2-C3 and C3-C4. Foraminal stenosis at multiple levels, most pronounced at C3-C4. No high grade osseous spinal canal stenosis in the cervical spine. SOFT TISSUES: No  prevertebral soft tissue swelling. IMPRESSION: 1. No evidence of acute traumatic injury. 2. Anterior cervical fusion hardware at C5 to C7 with solid osseous fusion at these levels and cerclage wires over the spinous processes of C5 to C7. Hardware is intact. Electronically signed by: Donnice Mania MD 07/27/2024 04:12 PM EST RP Workstation: [id number]   CT Head Wo Contrast Result Date: 07/27/2024 EXAM: CT HEAD WITHOUT CONTRAST 07/27/2024 03:10:49 PM TECHNIQUE: CT of the head was performed without the administration of intravenous contrast. Automated exposure control, iterative reconstruction, and/or weight based adjustment of the mA/kV was utilized to reduce the radiation dose to as low as reasonably achievable. COMPARISON: 05/22/2021 CLINICAL HISTORY: Head trauma, minor (Age >= 65 years). FINDINGS: BRAIN AND VENTRICLES: No acute hemorrhage. No evidence of acute infarct. No hydrocephalus. No extra-axial collection. No mass effect or midline shift. Hypoattenuation in the bilateral frontal lobe white matter suggestive of chronic microvascular ischemic changes.  Bilateral basal ganglia mineralization. ORBITS: No acute abnormality. SINUSES: No acute abnormality. SOFT TISSUES AND SKULL: No acute soft tissue abnormality. No skull fracture. IMPRESSION: 1. No acute intracranial abnormality. Electronically signed by: Donnice Mania MD 07/27/2024 04:04 PM EST RP Workstation: [id number]   DG Chest Portable 1 View Result Date: 07/27/2024 CLINICAL DATA:  Fall. EXAM: PORTABLE CHEST 1 VIEW COMPARISON:  04/05/2022. FINDINGS: The heart size and mediastinal contour are within normal limits. Focal opacities are noted in the medial aspect of the lung bases bilaterally. No consolidation, effusion, or pneumothorax is seen. Cervical spinal fusion hardware is noted. No acute osseous abnormality. IMPRESSION: Focal opacities in the medial aspect of the lung bases bilaterally, possible atelectasis or infiltrate. Short-term follow-up is  recommended to exclude the possibility of underlying mass. Electronically Signed   By: Leita Birmingham M.D.   On: 07/27/2024 14:21        Scheduled Meds:  atorvastatin   10 mg Oral QHS   chlordiazePOXIDE   25 mg Oral QID   Followed by   NOREEN ON 07/29/2024] chlordiazePOXIDE   25 mg Oral TID   Followed by   NOREEN ON 07/30/2024] chlordiazePOXIDE   25 mg Oral BH-qamhs   Followed by   NOREEN ON 07/31/2024] chlordiazePOXIDE   25 mg Oral Daily   enoxaparin  (LOVENOX ) injection  40 mg Subcutaneous Daily   folic acid   1 mg Oral Daily   losartan   50 mg Oral Daily   multivitamin with minerals  1 tablet Oral Daily   sodium chloride  flush  3 mL Intravenous Q12H   Continuous Infusions:  ciprofloxacin  400 mg (07/28/24 0517)     LOS: 0 days    Time spent: over 30 min    Meliton Monte, MD [facility]   To contact the attending provider between 7A-7P or the covering provider during after hours 7P-7A, please log into the web site [email or web address] and access using universal [place] password for that web site. If you do not have the password, please call the hospital operator.  07/28/2024, 9:08 AM    "

## 2024-07-28 NOTE — Plan of Care (Signed)
   Problem: Health Behavior/Discharge Planning: Goal: Ability to manage health-related needs will improve Outcome: Progressing   Problem: Clinical Measurements: Goal: Diagnostic test results will improve Outcome: Progressing

## 2024-07-28 NOTE — Care Management Obs Status (Signed)
 MEDICARE OBSERVATION STATUS NOTIFICATION   Patient Details  Name: Ashley Bradley MRN: [id number] Date of Birth: [birth year]   Medicare Observation Status Notification Given:  Yes    Brecken Dewoody A Krishna Heuer, LCSW 07/28/2024, 1:41 PM

## 2024-07-28 NOTE — Plan of Care (Signed)
   Problem: Clinical Measurements: Goal: Will remain free from infection Outcome: Progressing Goal: Diagnostic test results will improve Outcome: Progressing   Problem: Nutrition: Goal: Adequate nutrition will be maintained Outcome: Progressing

## 2024-07-29 DIAGNOSIS — F1090 Alcohol use, unspecified, uncomplicated: Secondary | ICD-10-CM | POA: Diagnosis not present

## 2024-07-29 LAB — COMPREHENSIVE METABOLIC PANEL WITH GFR
ALT: 26 U/L (ref 0–44)
AST: 31 U/L (ref 15–41)
Albumin: 3.8 g/dL (ref 3.5–5.0)
Alkaline Phosphatase: 123 U/L (ref 38–126)
Anion gap: 10 (ref 5–15)
BUN: 19 mg/dL (ref 8–23)
CO2: 26 mmol/L (ref 22–32)
Calcium: 8.9 mg/dL (ref 8.9–10.3)
Chloride: 103 mmol/L (ref 98–111)
Creatinine, Ser: 0.97 mg/dL (ref 0.44–1.00)
GFR, Estimated: >60 mL/min
Glucose, Bld: 89 mg/dL (ref 70–99)
Potassium: 4.1 mmol/L (ref 3.5–5.1)
Sodium: 138 mmol/L (ref 135–145)
Total Bilirubin: 0.7 mg/dL (ref 0.0–1.2)
Total Protein: 6.5 g/dL (ref 6.5–8.1)

## 2024-07-29 LAB — CBC WITH DIFFERENTIAL/PLATELET
Abs Immature Granulocytes: 0.01 10*3/uL (ref 0.00–0.07)
Basophils Absolute: 0.1 10*3/uL (ref 0.0–0.1)
Basophils Relative: 1 %
Eosinophils Absolute: 0.2 10*3/uL (ref 0.0–0.5)
Eosinophils Relative: 4 %
HCT: 43.4 % (ref 36.0–46.0)
Hemoglobin: 13.9 g/dL (ref 12.0–15.0)
Immature Granulocytes: 0 %
Lymphocytes Relative: 35 %
Lymphs Abs: 2 10*3/uL (ref 0.7–4.0)
MCH: 29.6 pg (ref 26.0–34.0)
MCHC: 32 g/dL (ref 30.0–36.0)
MCV: 92.3 fL (ref 80.0–100.0)
Monocytes Absolute: 0.7 10*3/uL (ref 0.1–1.0)
Monocytes Relative: 11 %
Neutro Abs: 2.8 10*3/uL (ref 1.7–7.7)
Neutrophils Relative %: 49 %
Platelets: 202 10*3/uL (ref 150–400)
RBC: 4.7 MIL/uL (ref 3.87–5.11)
RDW: 13.1 % (ref 11.5–15.5)
WBC: 5.8 10*3/uL (ref 4.0–10.5)
nRBC: 0 % (ref 0.0–0.2)

## 2024-07-29 LAB — MAGNESIUM: Magnesium: 2.4 mg/dL (ref 1.7–2.4)

## 2024-07-29 LAB — PHOSPHORUS: Phosphorus: 3.3 mg/dL (ref 2.5–4.6)

## 2024-07-29 NOTE — Evaluation (Addendum)
 Physical Therapy Evaluation Patient Details Name: Ashley Bradley MRN: [id number] DOB: 08/02/1950 Today's Date: 07/29/2024  History of Present Illness  74 y.o. female presenting with altered mental status/intoxication. Dx of UTI, EtOH withdrawal. Pt with medical history significant of hypertension, QT prolongation, depression, cerebral palsy with R HP, seizure disorder, history of UTI, alcohol  use, history of alcohol  withdrawal seizure, history of delirium tremens.  Clinical Impression  Pt admitted with above diagnosis. Pt reports she is independent with ambulating with a cane at baseline, reports 2 falls in past 6 months. Pt has baseline of R leg length discrepancy 2* h/o CP. She ambulated 375' with RW, walking on toes of R foot, no loss of balance, but pt did bump into objects on her L 3 times, she denies visual deficits. HR up to 142 while walking, RN aware. Issued pt contact info for [employer] as she expressed interest in a built up shoe for her R foot. She is ready to DC home from a PT standpoint.  Pt currently with functional limitations due to the deficits listed below (see PT Problem List). Pt will benefit from acute skilled PT to increase their independence and safety with mobility to allow discharge.           If plan is discharge home, recommend the following: Assist for transportation;Help with stairs or ramp for entrance   Can travel by private vehicle        Equipment Recommendations None recommended by PT  Recommendations for Other Services       Functional Status Assessment Patient has had a recent decline in their functional status and demonstrates the ability to make significant improvements in function in a reasonable and predictable amount of time.     Precautions / Restrictions Precautions Precautions: Fall Recall of Precautions/Restrictions: Intact Precaution/Restrictions Comments: 2 falls in past 6 months; monitor HR Restrictions Weight Bearing  Restrictions Per Provider Order: No      Mobility  Bed Mobility Overal bed mobility: Modified Independent             General bed mobility comments: HOB up, used rail    Transfers Overall transfer level: Needs assistance Equipment used: Rolling walker (2 wheels) Transfers: Sit to/from Stand Sit to Stand: Modified independent (Device/Increase time)                Ambulation/Gait Ambulation/Gait assistance: Supervision Gait Distance (Feet): 375 Feet Assistive device: Rolling walker (2 wheels) Gait Pattern/deviations: Decreased dorsiflexion - right Gait velocity: WFL     General Gait Details: RLE shorter than LLE, walks on R toes 2* h/o CP; bumped into objects on L  with RW x 3 (pt reports no visual deficits, just wasn't paying attention), no loss of balance, HR up to 142 walking (RN aware), pt reported feeling heart racing  Stairs            Wheelchair Mobility     Tilt Bed    Modified Rankin (Stroke Patients Only)       Balance Overall balance assessment: Modified Independent                                           Pertinent Vitals/Pain Pain Assessment Pain Assessment: No/denies pain    Home Living Family/patient expects to be discharged to:: Private residence Living Arrangements: Alone Available Help at Discharge: Family Type of Home: House Home Access: Stairs  to enter Entrance Stairs-Rails: Left Entrance Stairs-Number of Steps: 2   Home Layout: Two level;Able to live on main level with bedroom/bathroom Home Equipment: Cane - single point;Rollator (4 wheels);Wheelchair - manual Additional Comments: daughter lives in [place], she works but can assist at times; pt's husband  died during a cardiac surgery 2 months ago    Prior Function Prior Level of Function : Independent/Modified Independent;History of Falls (last six months);Driving             Mobility Comments: walks with SPC; 2 falls in past 6  months ADLs Comments: independent bathing/dressing, drives     Extremity/Trunk Assessment   Upper Extremity Assessment Upper Extremity Assessment: Defer to OT evaluation;Left hand dominant;RUE deficits/detail RUE Deficits / Details: h/o CP with R HP, pt reports R hand cramps    Lower Extremity Assessment Lower Extremity Assessment: RLE deficits/detail RLE Deficits / Details: R leg shorter than LLE; knee ext +4/5, pt has some limited ankle AROM (DF to ~-20*, noted h/o CP) RLE Sensation: WNL    Cervical / Trunk Assessment Cervical / Trunk Assessment: Normal;Back Surgery  Communication   Communication Communication: No apparent difficulties    Cognition Arousal: Alert Behavior During Therapy: WFL for tasks assessed/performed   PT - Cognitive impairments: No apparent impairments                         Following commands: Intact       Cueing Cueing Techniques: Verbal cues     General Comments      Exercises     Assessment/Plan    PT Assessment Patient needs continued PT services  PT Problem List Cardiopulmonary status limiting activity       PT Treatment Interventions Gait training;Therapeutic exercise;Patient/family education    PT Goals (Current goals can be found in the Care Plan section)  Acute Rehab PT Goals Patient Stated Goal: get home to her dog PT Goal Formulation: With patient Time For Goal Achievement: 08/12/24 Potential to Achieve Goals: Good    Frequency Min 3X/week     Co-evaluation               AM-PAC PT 6 Clicks Mobility  Outcome Measure Help needed turning from your back to your side while in a flat bed without using bedrails?: None Help needed moving from lying on your back to sitting on the side of a flat bed without using bedrails?: None Help needed moving to and from a bed to a chair (including a wheelchair)?: None Help needed standing up from a chair using your arms (e.g., wheelchair or bedside chair)?:  None Help needed to walk in hospital room?: None Help needed climbing 3-5 steps with a railing? : A Little 6 Click Score: 23    End of Session Equipment Utilized During Treatment: Gait belt Activity Tolerance: Patient tolerated treatment well Patient left: in bed;with call bell/phone within reach;with bed alarm set;with nursing/sitter in room Nurse Communication: Mobility status PT Visit Diagnosis: Difficulty in walking, not elsewhere classified (R26.2)    Time: 1030-1059 PT Time Calculation (min) (ACUTE ONLY): 29 min   Charges:   PT Evaluation $PT Eval Moderate Complexity: 1 Mod PT Treatments $Gait Training: 8-22 mins PT General Charges $$ ACUTE PT VISIT: 1 Visit        Sylvan Delon Copp PT 07/29/2024  Acute Rehabilitation Services  Office [phone]

## 2024-07-29 NOTE — Evaluation (Signed)
 Occupational Therapy Evaluation Patient Details Name: Ashley Bradley MRN: [id number] DOB: [birth year] Today's Date: 07/29/2024   History of Present Illness   74 y.o. female presenting with altered mental status/intoxication. Dx of UTI, EtOH withdrawal. Pt with medical history significant of hypertension, QT prolongation, depression, cerebral palsy with R HP, seizure disorder, history of UTI, alcohol  use, history of alcohol  withdrawal seizure, history of delirium tremens.     Clinical Impressions Pt admitted with above. Prior to admission, pt lives alone and uses RW or [facility] for mobility, able to perform ADLs/IADLs mod independent including driving. Pt has hx of CP with residual R-sided hemiparesis and R leg length discrepancy. Pt performs bed mobility mod independent with use of bed features, dons socks supervision both supine in bed and seated EOB using figure four, and is able to perform functional STS transfers from standard commode at supervision-CGA level. Does walk on toes of R foot, no LOB experienced while reaching outside BOS to reach paper towels. Pt expressing interest in attending an outpatient speciality clinic for CP for OT/PT services, would recommend pt pursue this if desired at discharge. Pt would benefit from skilled OT services to address noted impairments and functional limitations (see below for any additional details) in order to maximize safety and independence while minimizing falls risk and caregiver burden. Anticipate the need for follow up OT services (outpatient neuro for CP per pt preference) upon acute hospital DC.      If plan is discharge home, recommend the following:   A little help with walking and/or transfers;A little help with bathing/dressing/bathroom;Assist for transportation     Functional Status Assessment   Patient has had a recent decline in their functional status and demonstrates the ability to make significant improvements in function in a  reasonable and predictable amount of time.     Equipment Recommendations   None recommended by OT      Precautions/Restrictions   Precautions Precautions: Fall Recall of Precautions/Restrictions: Intact Precaution/Restrictions Comments: 2 falls in past 6 months; monitor HR Restrictions Weight Bearing Restrictions Per Provider Order: No     Mobility Bed Mobility Overal bed mobility: Modified Independent             General bed mobility comments: HOB up, used rail    Transfers Overall transfer level: Needs assistance Equipment used: Rolling walker (2 wheels) Transfers: Sit to/from Stand Sit to Stand: Supervision                  Balance Overall balance assessment: Modified Independent, History of Falls                                         ADL either performed or assessed with clinical judgement   ADL Overall ADL's : Needs assistance/impaired Eating/Feeding: Sitting;Independent   Grooming: Wash/dry hands;Standing;Supervision/safety Grooming Details (indicate cue type and reason): sink level in bathroom, no LOB while reaching outside BOS to throw away paper towel. slow problem-solving at times, requires cues to locate paper towels and trash can Upper Body Bathing: Sitting;Set up   Lower Body Bathing: Minimal assistance;Sit to/from stand   Upper Body Dressing : Sitting;Set up   Lower Body Dressing: Sitting/lateral leans;Supervision/safety Lower Body Dressing Details (indicate cue type and reason): dons R sock seated EOB, states she typically dons sock bed level, L sock donned from supine figure four per pt preference Toilet Transfer: Contact guard assist;Regular  Toilet;Grab bars;Rolling walker (2 wheels);Ambulation Toilet Transfer Details (indicate cue type and reason): to/from bathroom using RW, no LOB, CGA for safety, able to stand from standard commode supervision for pericare Toileting- Clothing Manipulation and Hygiene:  Supervision/safety;Sit to/from stand       Functional mobility during ADLs: Contact guard assist;Rolling walker (2 wheels)       Vision Baseline Vision/History: 6 Macular Degeneration Ability to See in Adequate Light: 1 Impaired Patient Visual Report: No change from baseline Vision Assessment?: Wears glasses for reading;Wears glasses for driving Additional Comments: 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 able to read fine print and clock on wall [facility] although reports blurred vision without glasses.            Pertinent Vitals/Pain Pain Assessment Pain Assessment: No/denies pain     Extremity/Trunk Assessment Upper Extremity Assessment Upper Extremity Assessment: Left hand dominant;Overall WFL for tasks assessed RUE Deficits / Details: h/o CP with R HP, pt reports R hand cramps. does have functional RUE grasp on RW   Lower Extremity Assessment Lower Extremity Assessment: Defer to PT evaluation RLE Deficits / Details: R leg shorter than LLE; knee ext +4/5, pt has some limited ankle AROM (DF to ~-20*, noted h/o CP) RLE Sensation: WNL   Cervical / Trunk Assessment Cervical / Trunk Assessment: Normal;Back Surgery   Communication Communication Communication: No apparent difficulties   Cognition Arousal: Alert Behavior During Therapy: WFL for tasks assessed/performed Cognition: Cognition impaired           Executive functioning impairment (select all impairments): Problem solving, Reasoning OT - Cognition Comments: slow processing, lacks adequate problem-solving abilities, safety awareness deficits                 Following commands: Intact       Cueing  General Comments   Cueing Techniques: Verbal cues  HR 112 after returning back from bathroom.   Exercises Other Exercises Other Exercises: Pt states she would like to attend outpatient PT/OT (has clinic of choice in [place] that specializes in  CP).        Home Living Family/patient expects to be discharged to:: Private residence Living Arrangements: Alone Available Help at Discharge: Family Type of Home: House Home Access: Stairs to enter [employer] of Steps: 2 Entrance Stairs-Rails: Left Home Layout: Two level;Able to live on main level with bedroom/bathroom     Bathroom Shower/Tub: Walk-in shower   Bathroom Toilet: Handicapped height     Home Equipment: Cane - single point;Rollator (4 wheels);Wheelchair - manual;BSC/3in1;Shower seat - built in   Additional Comments: daughter lives in [place], she works but can assist at times; pt's husband  died during a cardiac surgery 2 months ago      Prior Functioning/Environment Prior Level of Function : Independent/Modified Independent;History of Falls (last six months);Driving             Mobility Comments: walks with SPC; 2 falls in past 6 months ADLs Comments: independent bathing/dressing, drives    OT Problem List: Decreased strength;Impaired balance (sitting and/or standing);Decreased range of motion;Decreased knowledge of use of DME or AE;Decreased coordination   OT Treatment/Interventions: Self-care/ADL training;DME and/or AE instruction;Therapeutic activities;Balance training;Therapeutic exercise;Neuromuscular education;Patient/family education      OT Goals(Current goals can be found in the care plan section)   Acute Rehab OT Goals OT Goal Formulation: With patient Time For Goal Achievement: 08/12/24 Potential to Achieve Goals: Good   OT  Frequency:  Min 2X/week       AM-PAC OT 6 Clicks Daily Activity     Outcome Measure Help from another person eating meals?: None Help from another person taking care of personal grooming?: None Help from another person toileting, which includes using toliet, bedpan, or urinal?: A Little Help from another person bathing (including washing, rinsing, drying)?: A Little Help from another person to  put on and taking off regular upper body clothing?: None Help from another person to put on and taking off regular lower body clothing?: A Little 6 Click Score: 21   End of Session Equipment Utilized During Treatment: Gait belt;Rolling walker (2 wheels) Nurse Communication: Mobility status  Activity Tolerance: Patient tolerated treatment well;No increased pain Patient left: in bed;with call bell/phone within reach;with bed alarm set  OT Visit Diagnosis: Unsteadiness on feet (R26.81);Other abnormalities of gait and mobility (R26.89);Repeated falls (R29.6);History of falling (Z91.81)                Time: 8786-8769 OT Time Calculation (min): 17 min Charges:  OT General Charges $OT Visit: 1 Visit OT Evaluation $OT Eval Moderate Complexity: 1 Mod  Ashad Fawbush L. Shelita Steptoe, OTR/L  07/29/24, 12:42 PM

## 2024-07-29 NOTE — Progress Notes (Signed)
 " PROGRESS NOTE    TOMIKA ECKLES  FMW:[id number] DOB: 1950-12-07 DOA: 07/27/2024 PCP: Marvene Prentice SAUNDERS, FNP  Chief Complaint  Patient presents with   Altered Mental Status    Brief Narrative:   CRYSTA GULICK is Kevin Space 74 y.o. female with medical history significant of hypertension, QT prolongation, depression, cerebral palsy, seizure disorder, history of UTI, alcohol  use, history of alcohol  withdrawal seizure, history of delirium tremens presenting with altered mental status/intoxication.   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)   Seizure disorder (HCC)   Prolonged Q-T interval on ECG   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HCC)   Essential hypertension   UTI (urinary tract infection)   Alcohol  withdrawal (HCC)   Alcohol  use disorder  Alcohol  Withdrawal  Alcohol  Intoxication  Found obtunded by daughter in setting of intoxication Per patient, she's been drinking 3 boxed wines daily for about 10 days She has hx prior DT and etoh withdrawal seizures - with her drinking for about 1.5 weeks, her risk of significant withdrawal is probably lower, but will be cautious in the setting of her significant intake and relevant history. S/p phenobarb in ED Continue librium  taper, ativan  prn.  CIWA less than 10 x24 hrs. She was sober 3 years before relapsing.  Encourage abstinence.   Mechanical Fall CT head, neck without acute findings In setting of above Therapy evaluation  Seizure disorder She reports having been tapered off of keppra  Seizure free since 2023 per 04/2024 neurology note.  At that time, no need to restart keppra  Seizure precautions in setting of binge drinking and risk of withdrawal  Cerebral Palsy  Spasticity  Cramps Has R sided weakness related to this  E. Coli UTI Urinalysis concerning for UTI with nitrates, leukocytes, bacteria.  Patient does have Travia Onstad history of recurrent UTI.  No leukocytosis. She  notes concerning symptoms for UTI Will transition to bactrim  to avoid FQ if possible Urine culture with e. Coli, sensitivities pending  QT prolongation Improved to 464 Caution with QT prolonging meds   Hypertension - Continue home losartan    Depression Grief Response Denies SI.  Offered to contact chaplain, she's not currently interested.  Discharge pending sensitivities, also, needs help getting into her home (family currently snowed in).  Ideally 1/27.    DVT prophylaxis: lovenox  Code Status: full Family Communication: none Disposition:   Status is: Observation The patient remains OBS appropriate and will d/c before 2 midnights.   Consultants:  none  Procedures:  none  Antimicrobials:  Anti-infectives (From admission, onward)    Start     Dose/Rate Route Frequency Ordered Stop   07/28/24 1800  sulfamethoxazole -trimethoprim  (BACTRIM  DS) 800-160 MG per tablet 1 tablet        1 tablet Oral Every 12 hours 07/28/24 0924     07/28/24 0600  ciprofloxacin  (CIPRO ) IVPB 400 mg  Status:  Discontinued        400 mg 200 mL/hr over 60 Minutes Intravenous Every 12 hours 07/27/24 1702 07/28/24 0924   07/27/24 1645  ciprofloxacin  (CIPRO ) IVPB 400 mg        400 mg 200 mL/hr over 60 Minutes Intravenous  Once 07/27/24 1635 07/27/24 2009       Subjective: Feels better today No new complaints withdrawal symptoms better  Objective: Vitals:   07/28/24 1827 07/28/24 2043 07/29/24 0505 07/29/24 0811  BP: (!) 107/58 (!) 108/51 (!) 148/80   Pulse: 85 86 82 85  Resp:  19 19   Temp:  97.9 F (36.6 C) 98.5 F (36.9 C)   TempSrc:   Oral   SpO2:  91% 98%   Weight:      Height:        Intake/Output Summary (Last 24 hours) at 07/29/2024 1343 Last data filed at 07/29/2024 0915 Gross per 24 hour  Intake --  Output 2150 ml  Net -2150 ml   Filed Weights   07/27/24 1241  Weight: 63.5 kg    Examination:  General: No acute distress. Cardiovascular: RRR Lungs:  unlabored Neurological: Alert and oriented 3. Moves all extremities 4 - chronic R sided weakness.  Walked with walker to bathroom. Skin: Warm and dry. No rashes or lesions. Extremities: No clubbing or cyanosis. No edema.   Data Reviewed: I have personally reviewed following labs and imaging studies  CBC: Recent Labs  Lab 07/27/24 1310 07/28/24 0525 07/29/24 0442  WBC 7.3 7.5 5.8  NEUTROABS  --   --  2.8  HGB 14.9 13.5 13.9  HCT 44.9 41.7 43.4  MCV 90.2 91.9 92.3  PLT 283 237 202    Basic Metabolic Panel: Recent Labs  Lab 07/27/24 1310 07/28/24 0525 07/29/24 0442  NA 145 141 138  K 4.2 4.9 4.1  CL 106 104 103  CO2 24 29 26   GLUCOSE 94 99 89  BUN 7* 13 19  CREATININE 0.59 0.87 0.97  CALCIUM  8.7* 9.0 8.9  MG 2.3  --  2.4  PHOS 4.0  --  3.3    GFR: Estimated Creatinine Clearance: 44.9 mL/min (by C-G formula based on SCr of 0.97 mg/dL).  Liver Function Tests: Recent Labs  Lab 07/27/24 1310 07/28/24 0525 07/29/24 0442  AST 44* 31 31  ALT 30 25 26   ALKPHOS 140* 126 123  BILITOT 0.2 0.6 0.7  PROT 7.5 6.5 6.5  ALBUMIN 4.4 3.9 3.8    CBG: Recent Labs  Lab 07/27/24 1308 07/27/24 1309  GLUCAP 80 84     Recent Results (from the past 240 hours)  Urine Culture (for pregnant, neutropenic or urologic patients or patients with an indwelling urinary catheter)     Status: Abnormal (Preliminary result)   Collection Time: 07/27/24  3:44 PM   Specimen: Urine, Clean Catch  Result Value Ref Range Status   Specimen Description   Final    URINE, CLEAN CATCH Performed at [facility], [address], [place], [place] [address]    Special Requests   Final    NONE Performed at [facility], [address] [place], [place] [address]    Culture (Marjie Chea)  Final    >=100,000 COLONIES/mL ESCHERICHIA COLI SUSCEPTIBILITIES TO FOLLOW CULTURE REINCUBATED FOR BETTER GROWTH Performed at [facility], [address] [place],  [place] [address]    Report Status PENDING  Incomplete         Radiology Studies: CT Cervical Spine Wo Contrast Result Date: 07/27/2024 EXAM: CT CERVICAL SPINE WITHOUT CONTRAST 07/27/2024 03:10:49 PM TECHNIQUE: CT of the cervical spine was performed without the administration of intravenous contrast. Multiplanar reformatted images are provided for review. Automated exposure control, iterative reconstruction, and/or weight based adjustment of the mA/kV was utilized to reduce the radiation dose to as low as reasonably achievable. COMPARISON: None available. CLINICAL HISTORY: Neck trauma (Age >= 65y). Neck trauma. Age greater than or equal to 65 years. FINDINGS: BONES AND ALIGNMENT: Anterior cervical fusion hardware at C5-C7 with solid osseous fusion at these  levels. Cerclage wires over the spinous processes of C5-C7. Hardware is intact. Straightening of the normal cervical lordosis. No evidence of traumatic malalignment. No acute fracture. DEGENERATIVE CHANGES: Mild disc space narrowing at C4-C5. Facet arthrosis most pronounced on the left at C2-C3 and C3-C4. Foraminal stenosis at multiple levels, most pronounced at C3-C4. No high grade osseous spinal canal stenosis in the cervical spine. SOFT TISSUES: No prevertebral soft tissue swelling. IMPRESSION: 1. No evidence of acute traumatic injury. 2. Anterior cervical fusion hardware at C5 to C7 with solid osseous fusion at these levels and cerclage wires over the spinous processes of C5 to C7. Hardware is intact. Electronically signed by: Donnice Mania MD 07/27/2024 04:12 PM EST RP Workstation: [id number]   CT Head Wo Contrast Result Date: 07/27/2024 EXAM: CT HEAD WITHOUT CONTRAST 07/27/2024 03:10:49 PM TECHNIQUE: CT of the head was performed without the administration of intravenous contrast. Automated exposure control, iterative reconstruction, and/or weight based adjustment of the mA/kV was utilized to reduce the radiation dose to as low as reasonably achievable.  COMPARISON: 05/22/2021 CLINICAL HISTORY: Head trauma, minor (Age >= 65 years). FINDINGS: BRAIN AND VENTRICLES: No acute hemorrhage. No evidence of acute infarct. No hydrocephalus. No extra-axial collection. No mass effect or midline shift. Hypoattenuation in the bilateral frontal lobe white matter suggestive of chronic microvascular ischemic changes. Bilateral basal ganglia mineralization. ORBITS: No acute abnormality. SINUSES: No acute abnormality. SOFT TISSUES AND SKULL: No acute soft tissue abnormality. No skull fracture. IMPRESSION: 1. No acute intracranial abnormality. Electronically signed by: Donnice Mania MD 07/27/2024 04:04 PM EST RP Workstation: [id number]        Scheduled Meds:  atorvastatin   10 mg Oral QHS   chlordiazePOXIDE   25 mg Oral TID   Followed by   NOREEN ON 07/30/2024] chlordiazePOXIDE   25 mg Oral BH-qamhs   Followed by   NOREEN ON 07/31/2024] chlordiazePOXIDE   25 mg Oral Daily   enoxaparin  (LOVENOX ) injection  40 mg Subcutaneous Daily   folic acid   1 mg Oral Daily   losartan   50 mg Oral Daily   multivitamin with minerals  1 tablet Oral Daily   sodium chloride  flush  3 mL Intravenous Q12H   sulfamethoxazole -trimethoprim   1 tablet Oral Q12H   Continuous Infusions:     LOS: 1 day    Time spent: over 30 min    Meliton Monte, MD [facility]   To contact the attending provider between 7A-7P or the covering provider during after hours 7P-7A, please log into the web site [email or web address] and access using universal [place] password for that web site. If you do not have the password, please call the hospital operator.  07/29/2024, 1:43 PM    "

## 2024-07-30 ENCOUNTER — Other Ambulatory Visit (HOSPITAL_COMMUNITY): Payer: Self-pay

## 2024-07-30 DIAGNOSIS — F1090 Alcohol use, unspecified, uncomplicated: Secondary | ICD-10-CM | POA: Diagnosis not present

## 2024-07-30 LAB — CBC WITH DIFFERENTIAL/PLATELET
Abs Immature Granulocytes: 0.01 10*3/uL (ref 0.00–0.07)
Basophils Absolute: 0.1 10*3/uL (ref 0.0–0.1)
Basophils Relative: 2 %
Eosinophils Absolute: 0.3 10*3/uL (ref 0.0–0.5)
Eosinophils Relative: 7 %
HCT: 42.2 % (ref 36.0–46.0)
Hemoglobin: 13.7 g/dL (ref 12.0–15.0)
Immature Granulocytes: 0 %
Lymphocytes Relative: 39 %
Lymphs Abs: 1.6 10*3/uL (ref 0.7–4.0)
MCH: 29.7 pg (ref 26.0–34.0)
MCHC: 32.5 g/dL (ref 30.0–36.0)
MCV: 91.5 fL (ref 80.0–100.0)
Monocytes Absolute: 0.5 10*3/uL (ref 0.1–1.0)
Monocytes Relative: 13 %
Neutro Abs: 1.6 10*3/uL — ABNORMAL LOW (ref 1.7–7.7)
Neutrophils Relative %: 39 %
Platelets: 199 10*3/uL (ref 150–400)
RBC: 4.61 MIL/uL (ref 3.87–5.11)
RDW: 13.1 % (ref 11.5–15.5)
WBC: 4 10*3/uL (ref 4.0–10.5)
nRBC: 0 % (ref 0.0–0.2)

## 2024-07-30 LAB — COMPREHENSIVE METABOLIC PANEL WITH GFR
ALT: 30 U/L (ref 0–44)
AST: 36 U/L (ref 15–41)
Albumin: 4 g/dL (ref 3.5–5.0)
Alkaline Phosphatase: 129 U/L — ABNORMAL HIGH (ref 38–126)
Anion gap: 9 (ref 5–15)
BUN: 17 mg/dL (ref 8–23)
CO2: 25 mmol/L (ref 22–32)
Calcium: 9 mg/dL (ref 8.9–10.3)
Chloride: 104 mmol/L (ref 98–111)
Creatinine, Ser: 0.84 mg/dL (ref 0.44–1.00)
GFR, Estimated: >60 mL/min
Glucose, Bld: 85 mg/dL (ref 70–99)
Potassium: 4 mmol/L (ref 3.5–5.1)
Sodium: 138 mmol/L (ref 135–145)
Total Bilirubin: 0.5 mg/dL (ref 0.0–1.2)
Total Protein: 6.8 g/dL (ref 6.5–8.1)

## 2024-07-30 LAB — URINE CULTURE: Culture: >=100000 — AB

## 2024-07-30 LAB — MAGNESIUM: Magnesium: 2.4 mg/dL (ref 1.7–2.4)

## 2024-07-30 LAB — PHOSPHORUS: Phosphorus: 3.5 mg/dL (ref 2.5–4.6)

## 2024-07-30 MED ORDER — CHLORDIAZEPOXIDE HCL 25 MG PO CAPS
ORAL_CAPSULE | ORAL | 0 refills | Status: AC
  Filled 2024-07-30: qty 5, 3d supply, fill #0

## 2024-07-30 MED ORDER — SULFAMETHOXAZOLE-TRIMETHOPRIM 800-160 MG PO TABS
1.0000 | ORAL_TABLET | Freq: Two times a day (BID) | ORAL | 0 refills | Status: AC
  Filled 2024-07-30: qty 8, 4d supply, fill #0

## 2024-07-30 NOTE — Discharge Instructions (Signed)
 Get chest x-ray on follow up with you PCP

## 2024-07-30 NOTE — TOC Progression Note (Signed)
 Transition of Care [facility]) - Progression Note   Patient Details  Name: Ashley Bradley MRN: [id number] Date of Birth: 01-22-1951  Transition of [facility] CM/SW Contact  Duwaine GORMAN Aran, LCSW Phone Number: 07/30/2024, 8:56 AM  Clinical Narrative: PT and OT evaluations did not recommend any follow up or DME at this time. Care management to follow.  Expected Discharge Plan: Home/Self Care Barriers to Discharge: Continued Medical Work up  Expected Discharge Plan and Services In-house Referral: Clinical Social Work Discharge Planning Services: NA Post Acute Care Choice: NA Living arrangements for the past 2 months: Single Family Home             DME Arranged: N/A DME Agency: NA HH Arranged: NA HH Agency: NA  Social Drivers of Health (SDOH) Interventions SDOH Screenings   Food Insecurity: No Food Insecurity (07/27/2024)  Housing: Low Risk (07/27/2024)  Transportation Needs: No Transportation Needs (07/27/2024)  Utilities: Not At Risk (07/27/2024)  Financial Resource Strain: Medium Risk (10/10/2023)   Received from [facility]  Physical Activity: Inactive (10/10/2023)   Received from [facility]  Social Connections: Moderately Integrated (07/27/2024)  Recent Concern: Social Connections - Moderately Isolated (07/27/2024)  Stress: Stress Concern Present (10/10/2023)   Received from [facility]  Tobacco Use: Low Risk (07/27/2024)   Readmission Risk Interventions     No data to display

## 2024-07-30 NOTE — Discharge Summary (Signed)
 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 female with medical history significant of hypertension, QT prolongation, depression, cerebral palsy, seizure disorder, history of UTI, alcohol  use, history of alcohol  withdrawal seizure, history of delirium tremens presenting with altered mental status/intoxication.   She was admitted for alcohol  intoxication with concern for withdrawal.    Hospital Course:  Assessment and Plan:  Alcohol  Withdrawal  Alcohol  Intoxication  Found obtunded by daughter in setting of intoxication Per patient, she's been drinking 3 boxed wines daily for about 10 days She has hx prior DT and etoh withdrawal seizures - with her drinking for about 1.5 weeks, her risk of significant withdrawal is probably lower, but will be cautious in the setting of her significant intake and relevant history. S/p phenobarb in ED CIWA last 24 hrs less than 10.  Does not appear to be in significant withdrawal.  Will d/c with short course of librium . She was sober 3 years before relapsing.  Encourage abstinence.    Mechanical Fall CT head, neck without acute  findings In setting of above Therapy evaluation   E. Coli UTI Urinalysis concerning for UTI with nitrates, leukocytes, bacteria.  Patient does have Titilayo Hagans history of recurrent UTI.  No leukocytosis. She notes concerning symptoms for UTI Will transition to bactrim  to avoid FQ if possible Urine culture with e. Coli, sensitive to bactrim .  Will d/c on bactrim .  Abnormal CXR Needs follow up CXR outpatient   Seizure disorder She reports having been tapered off of keppra  Seizure free since 2023 per 04/2024 neurology note.  At that time, no need to restart keppra  Seizure precautions in setting of binge drinking and risk of withdrawal   Cerebral Palsy  Spasticity  Cramps Has R sided weakness related to this   QT prolongation Improved to 464 Caution with QT prolonging meds   Hypertension - Continue home losartan    Depression Grief Response Denies SI.  Offered to contact chaplain, she's not currently interested.    Procedures: none   Consultations: none  Discharge Exam: Vitals:   07/29/24 2140 07/30/24 0545  BP: 138/75 124/72  Pulse: 85 81  Resp: 19   Temp: 97.7 F (36.5 C) 97.7 F (36.5 C)  SpO2: 95% 97%   No complaints Eager to discharge - wants us  to discontinue the bed alarm  Called daughter to discuss d/c plan, but no answer  General: No acute distress. Cardiovascular: RRR Lungs: unlabored Abdomen: Soft, nontender, nondistended Neurological: Alert and oriented 3. Moves all extremities 4 with equal strength. Cranial nerves II through XII grossly intact. Extremities: No clubbing or cyanosis. No edema.   Discharge Instructions   Discharge Instructions     Call  MD for:  difficulty breathing, headache or visual disturbances   Complete by: As directed    Call MD for:  extreme fatigue   Complete by: As directed    Call MD for:  hives   Complete by: As directed    Call MD for:  persistant dizziness or light-headedness   Complete by: As directed    Call  MD for:  persistant nausea and vomiting   Complete by: As directed    Call MD for:  redness, tenderness, or signs of infection (pain, swelling, redness, odor or green/yellow discharge around incision site)   Complete by: As directed    Call MD for:  severe uncontrolled pain   Complete by: As directed    Call MD for:  temperature >100.4   Complete by: As directed    Discharge instructions   Complete by: As directed    You were seen for alcohol  intoxication and alcohol  withdrawal.  You've improved.  Continue to avoid alcohol  in the future.  Ask your PCP about resources like AA, counseling, and other support for alcohol  cessation.    You had Gyan Cambre urinary tract infection that we're treating with bactrim .  You had an abnormal chest x ray that should be repeated with your PCP as an outpatient.  Return for new, recurrent, or worsening symptoms.  Please ask your PCP to request records from this hospitalization so they know what was done and what the next steps will be.   Increase activity slowly   Complete by: As directed       Allergies as of 07/30/2024       Reactions   Ancef  [cefazolin ] Other (See Comments)   Skin blisters, peels Has tolerated Unasyn , Rocephin , Zosyn  previously   Pantopaque [iophendylate] Other (See Comments)   Aseptic meningitis.   Macrodantin [nitrofurantoin] Nausea And Vomiting   Morphine  And Codeine Nausea And Vomiting   Per patient, only has reaction when given IV. Okay to take PO.        Medication List     STOP taking these medications    estradiol  0.1 MG/GM vaginal cream Commonly known as: ESTRACE    Gemtesa  75 MG Tabs Generic drug: Vibegron    levETIRAcetam  500 MG tablet Commonly known as: KEPPRA    Uribel  118 MG Caps       TAKE these medications    atorvastatin  10 MG tablet Commonly known as: LIPITOR Take 10 mg by mouth daily. What changed: Another medication with the same name was removed. Continue taking this medication, and follow the  directions you see here.   BIOTIN PO Take by mouth.   chlordiazePOXIDE  25 MG capsule Commonly known as: LIBRIUM  Take 1 capsule (25 mg total) by mouth 2 (two) times daily in the am and at bedtime. for 2 days, THEN 1 capsule (25 mg total) daily for 1 day. Start taking on: July 30, 2024   lidocaine  5 % ointment Commonly known as: XYLOCAINE  Use 0.5g peasize over abdomen up to 3 times Layne Lebon day   losartan  50 MG tablet Commonly known as: COZAAR  Take 50 mg by mouth daily.   multivitamin with minerals Tabs tablet Take 1 tablet by mouth daily.   saccharomyces boulardii 250 MG capsule Commonly known as: FLORASTOR Take 500 mg by mouth every evening.   sulfamethoxazole -trimethoprim  800-160 MG tablet Commonly known as: BACTRIM  DS Take 1 tablet by mouth every 12 (twelve) hours for 4 days.   Vitamin D3 50 MCG (2000 UT) Tabs Take 2,000 Units by mouth daily.  Allergies[1]    The results of significant diagnostics from this hospitalization (including imaging, microbiology, ancillary and laboratory) are listed below for reference.    Significant Diagnostic Studies: CT Cervical Spine Wo Contrast Result Date: 07/27/2024 EXAM: CT CERVICAL SPINE WITHOUT CONTRAST 07/27/2024 03:10:49 PM TECHNIQUE: CT of the cervical spine was performed without the administration of intravenous contrast. Multiplanar reformatted images are provided for review. Automated exposure control, iterative reconstruction, and/or weight based adjustment of the mA/kV was utilized to reduce the radiation dose to as low as reasonably achievable. COMPARISON: None available. CLINICAL HISTORY: Neck trauma (Age >= 65y). Neck trauma. Age greater than or equal to 65 years. FINDINGS: BONES AND ALIGNMENT: Anterior cervical fusion hardware at C5-C7 with solid osseous fusion at these levels. Cerclage wires over the spinous processes of C5-C7. Hardware is intact. Straightening of the normal cervical lordosis. No evidence of traumatic  malalignment. No acute fracture. DEGENERATIVE CHANGES: Mild disc space narrowing at C4-C5. Facet arthrosis most pronounced on the left at C2-C3 and C3-C4. Foraminal stenosis at multiple levels, most pronounced at C3-C4. No high grade osseous spinal canal stenosis in the cervical spine. SOFT TISSUES: No prevertebral soft tissue swelling. IMPRESSION: 1. No evidence of acute traumatic injury. 2. Anterior cervical fusion hardware at C5 to C7 with solid osseous fusion at these levels and cerclage wires over the spinous processes of C5 to C7. Hardware is intact. Electronically signed by: Donnice Mania MD 07/27/2024 04:12 PM EST RP Workstation: [id number]   CT Head Wo Contrast Result Date: 07/27/2024 EXAM: CT HEAD WITHOUT CONTRAST 07/27/2024 03:10:49 PM TECHNIQUE: CT of the head was performed without the administration of intravenous contrast. Automated exposure control, iterative reconstruction, and/or weight based adjustment of the mA/kV was utilized to reduce the radiation dose to as low as reasonably achievable. COMPARISON: 05/22/2021 CLINICAL HISTORY: Head trauma, minor (Age >= 65 years). FINDINGS: BRAIN AND VENTRICLES: No acute hemorrhage. No evidence of acute infarct. No hydrocephalus. No extra-axial collection. No mass effect or midline shift. Hypoattenuation in the bilateral frontal lobe white matter suggestive of chronic microvascular ischemic changes. Bilateral basal ganglia mineralization. ORBITS: No acute abnormality. SINUSES: No acute abnormality. SOFT TISSUES AND SKULL: No acute soft tissue abnormality. No skull fracture. IMPRESSION: 1. No acute intracranial abnormality. Electronically signed by: Donnice Mania MD 07/27/2024 04:04 PM EST RP Workstation: [id number]   DG Chest Portable 1 View Result Date: 07/27/2024 CLINICAL DATA:  Fall. EXAM: PORTABLE CHEST 1 VIEW COMPARISON:  04/05/2022. FINDINGS: The heart size and mediastinal contour are within normal limits. Focal opacities are noted in the medial  aspect of the lung bases bilaterally. No consolidation, effusion, or pneumothorax is seen. Cervical spinal fusion hardware is noted. No acute osseous abnormality. IMPRESSION: Focal opacities in the medial aspect of the lung bases bilaterally, possible atelectasis or infiltrate. Short-term follow-up is recommended to exclude the possibility of underlying mass. Electronically Signed   By: Leita Birmingham M.D.   On: 07/27/2024 14:21    Microbiology: Recent Results (from the past 240 hours)  Urine Culture (for pregnant, neutropenic or urologic patients or patients with an indwelling urinary catheter)     Status: Abnormal   Collection Time: 07/27/24  3:44 PM   Specimen: Urine, Clean Catch  Result Value Ref Range Status   Specimen Description   Final    URINE, CLEAN CATCH Performed at [facility], [address], [place], [place] [address]    Special Requests   Final    NONE Performed at [facility],  [address], [place], [place] [address]    Culture >=100,000 COLONIES/mL ESCHERICHIA COLI (Steffany Schoenfelder)  Final   Report Status 07/30/2024 FINAL  Final   Organism ID, Bacteria ESCHERICHIA COLI (Zehra Rucci)  Final      Susceptibility   Escherichia coli - MIC*    AMPICILLIN  <=2 SENSITIVE Sensitive     CEFAZOLIN  (URINE) Value in next row Sensitive      <=1 SENSITIVEThis is Nicholad Kautzman modified FDA-approved test that has been validated and its performance characteristics determined by the reporting laboratory.  This laboratory is certified under the Clinical Laboratory Improvement Amendments CLIA as qualified to perform high complexity clinical laboratory testing.    CEFEPIME  Value in next row Sensitive      <=1 SENSITIVEThis is Blayn Whetsell modified FDA-approved test that has been validated and its performance characteristics determined by the reporting laboratory.  This laboratory is certified under the Clinical Laboratory Improvement Amendments CLIA as qualified to perform high complexity clinical  laboratory testing.    ERTAPENEM Value in next row Sensitive      <=1 SENSITIVEThis is Kaheem Halleck modified FDA-approved test that has been validated and its performance characteristics determined by the reporting laboratory.  This laboratory is certified under the Clinical Laboratory Improvement Amendments CLIA as qualified to perform high complexity clinical laboratory testing.    CEFTRIAXONE  Value in next row Sensitive      <=1 SENSITIVEThis is Ryota Treece modified FDA-approved test that has been validated and its performance characteristics determined by the reporting laboratory.  This laboratory is certified under the Clinical Laboratory Improvement Amendments CLIA as qualified to perform high complexity clinical laboratory testing.    CIPROFLOXACIN  Value in next row Sensitive      <=1 SENSITIVEThis is Brooke Steinhilber modified FDA-approved test that has been validated and its performance characteristics determined by the reporting laboratory.  This laboratory is certified under the Clinical Laboratory Improvement Amendments CLIA as qualified to perform high complexity clinical laboratory testing.    GENTAMICIN Value in next row Sensitive      <=1 SENSITIVEThis is Edmar Blankenburg modified FDA-approved test that has been validated and its performance characteristics determined by the reporting laboratory.  This laboratory is certified under the Clinical Laboratory Improvement Amendments CLIA as qualified to perform high complexity clinical laboratory testing.    NITROFURANTOIN Value in next row Sensitive      <=1 SENSITIVEThis is Haris Baack modified FDA-approved test that has been validated and its performance characteristics determined by the reporting laboratory.  This laboratory is certified under the Clinical Laboratory Improvement Amendments CLIA as qualified to perform high complexity clinical laboratory testing.    TRIMETH /SULFA  Value in next row Sensitive      <=1 SENSITIVEThis is Javeria Briski modified FDA-approved test that has been validated and its  performance characteristics determined by the reporting laboratory.  This laboratory is certified under the Clinical Laboratory Improvement Amendments CLIA as qualified to perform high complexity clinical laboratory testing.    AMPICILLIN /SULBACTAM Value in next row Sensitive      <=1 SENSITIVEThis is Spencer Peterkin modified FDA-approved test that has been validated and its performance characteristics determined by the reporting laboratory.  This laboratory is certified under the Clinical Laboratory Improvement Amendments CLIA as qualified to perform high complexity clinical laboratory testing.    PIP/TAZO Value in next row Sensitive      <=4 SENSITIVEThis is Sameria Morss modified FDA-approved test that has been validated and its performance characteristics determined by the reporting laboratory.  This laboratory is certified under the Clinical Laboratory Improvement Amendments CLIA  as qualified to perform high complexity clinical laboratory testing.    MEROPENEM Value in next row Sensitive      <=4 SENSITIVEThis is Sheneika Walstad modified FDA-approved test that has been validated and its performance characteristics determined by the reporting laboratory.  This laboratory is certified under the Clinical Laboratory Improvement Amendments CLIA as qualified to perform high complexity clinical laboratory testing.    * >=100,000 COLONIES/mL ESCHERICHIA COLI     Labs: Basic Metabolic Panel: Recent Labs  Lab 07/27/24 1310 07/28/24 0525 07/29/24 0442 07/30/24 0530  NA 145 141 138 138  K 4.2 4.9 4.1 4.0  CL 106 104 103 104  CO2 24 29 26 25   GLUCOSE 94 99 89 85  BUN 7* 13 19 17   CREATININE 0.59 0.87 9.02 0.84  CALCIUM  8.7* 9.0 8.9 9.0  MG 2.3  --  2.4 2.4  PHOS 4.0  --  3.3 3.5   Liver Function Tests: Recent Labs  Lab 07/27/24 1310 07/28/24 0525 07/29/24 0442 07/30/24 0530  AST 44* 31 31 36  ALT 30 25 26 30   ALKPHOS 140* 126 123 129*  BILITOT 0.2 0.6 0.7 0.5  PROT 7.5 6.5 6.5 6.8  ALBUMIN 4.4 3.9 3.8 4.0   No results  for input(s): LIPASE, AMYLASE in the last 168 hours. No results for input(s): AMMONIA in the last 168 hours. CBC: Recent Labs  Lab 07/27/24 1310 07/28/24 0525 07/29/24 0442 07/30/24 0530  WBC 7.3 7.5 5.8 4.0  NEUTROABS  --   --  2.8 1.6*  HGB 14.9 13.5 13.9 13.7  HCT 44.9 41.7 43.4 42.2  MCV 90.2 91.9 92.3 91.5  PLT 283 237 202 199   Cardiac Enzymes: No results for input(s): CKTOTAL, CKMB, CKMBINDEX, TROPONINI in the last 168 hours. BNP: BNP (last 3 results) No results for input(s): BNP in the last 8760 hours.  ProBNP (last 3 results) No results for input(s): PROBNP in the last 8760 hours.  CBG: Recent Labs  Lab 07/27/24 1308 07/27/24 1309  GLUCAP 80 84       Signed:  Meliton Monte MD.  [facility] 07/30/2024, 9:57 AM       [1]  Allergies Allergen Reactions   Ancef  [Cefazolin ] Other (See Comments)    Skin blisters, peels Has tolerated Unasyn , Rocephin , Zosyn  previously   Pantopaque [Iophendylate] Other (See Comments)    Aseptic meningitis.   Macrodantin [Nitrofurantoin] Nausea And Vomiting   Morphine  And Codeine Nausea And Vomiting    Per patient, only has reaction when given IV. Okay to take PO.

## 2024-07-30 NOTE — Progress Notes (Addendum)
 Discharge meds in a secure bag delivered to patient's RN Johnnie)  by this RN.

## 2024-08-02 ENCOUNTER — Other Ambulatory Visit (HOSPITAL_COMMUNITY): Payer: Self-pay
